# Patient Record
Sex: Female | Born: 1935 | Race: White | Hispanic: No | Smoking: Former smoker
Health system: Southern US, Community
[De-identification: ages and names within clinical notes are randomized; demographics above are authoritative.]

## PROBLEM LIST (undated history)

## (undated) DIAGNOSIS — G8929 Other chronic pain: Secondary | ICD-10-CM

## (undated) DIAGNOSIS — K224 Dyskinesia of esophagus: Secondary | ICD-10-CM

## (undated) DIAGNOSIS — Z8719 Personal history of other diseases of the digestive system: Secondary | ICD-10-CM

## (undated) DIAGNOSIS — K219 Gastro-esophageal reflux disease without esophagitis: Secondary | ICD-10-CM

## (undated) DIAGNOSIS — K635 Polyp of colon: Secondary | ICD-10-CM

## (undated) DIAGNOSIS — J42 Unspecified chronic bronchitis: Secondary | ICD-10-CM

## (undated) DIAGNOSIS — J45909 Unspecified asthma, uncomplicated: Secondary | ICD-10-CM

## (undated) DIAGNOSIS — M1990 Unspecified osteoarthritis, unspecified site: Secondary | ICD-10-CM

## (undated) DIAGNOSIS — I509 Heart failure, unspecified: Secondary | ICD-10-CM

## (undated) DIAGNOSIS — I2510 Atherosclerotic heart disease of native coronary artery without angina pectoris: Secondary | ICD-10-CM

## (undated) DIAGNOSIS — G009 Bacterial meningitis, unspecified: Secondary | ICD-10-CM

## (undated) DIAGNOSIS — E039 Hypothyroidism, unspecified: Secondary | ICD-10-CM

## (undated) DIAGNOSIS — I10 Essential (primary) hypertension: Secondary | ICD-10-CM

## (undated) DIAGNOSIS — Z9889 Other specified postprocedural states: Secondary | ICD-10-CM

## (undated) DIAGNOSIS — E785 Hyperlipidemia, unspecified: Secondary | ICD-10-CM

## (undated) DIAGNOSIS — H269 Unspecified cataract: Secondary | ICD-10-CM

## (undated) DIAGNOSIS — M797 Fibromyalgia: Secondary | ICD-10-CM

## (undated) DIAGNOSIS — I4891 Unspecified atrial fibrillation: Secondary | ICD-10-CM

## (undated) DIAGNOSIS — N200 Calculus of kidney: Secondary | ICD-10-CM

## (undated) DIAGNOSIS — K56609 Unspecified intestinal obstruction, unspecified as to partial versus complete obstruction: Secondary | ICD-10-CM

## (undated) DIAGNOSIS — F32A Depression, unspecified: Secondary | ICD-10-CM

## (undated) DIAGNOSIS — Z87442 Personal history of urinary calculi: Secondary | ICD-10-CM

## (undated) DIAGNOSIS — F329 Major depressive disorder, single episode, unspecified: Secondary | ICD-10-CM

## (undated) DIAGNOSIS — M549 Dorsalgia, unspecified: Secondary | ICD-10-CM

## (undated) DIAGNOSIS — F419 Anxiety disorder, unspecified: Secondary | ICD-10-CM

## (undated) HISTORY — PX: ROTATOR CUFF REPAIR: SHX139

## (undated) HISTORY — PX: JOINT REPLACEMENT: SHX530

## (undated) HISTORY — PX: BREAST SURGERY: SHX581

## (undated) HISTORY — PX: BRONCHOSCOPY: SUR163

## (undated) HISTORY — PX: ABDOMINAL HYSTERECTOMY: SHX81

## (undated) HISTORY — PX: BACK SURGERY: SHX140

## (undated) HISTORY — DX: Heart failure, unspecified: I50.9

## (undated) HISTORY — DX: Gastro-esophageal reflux disease without esophagitis: K21.9

## (undated) HISTORY — PX: TOTAL KNEE ARTHROPLASTY: SHX125

## (undated) HISTORY — DX: Hyperlipidemia, unspecified: E78.5

## (undated) HISTORY — DX: Unspecified cataract: H26.9

## (undated) HISTORY — DX: Anxiety disorder, unspecified: F41.9

## (undated) HISTORY — DX: Unspecified osteoarthritis, unspecified site: M19.90

## (undated) HISTORY — DX: Major depressive disorder, single episode, unspecified: F32.9

## (undated) HISTORY — DX: Unspecified atrial fibrillation: I48.91

## (undated) HISTORY — DX: Dyskinesia of esophagus: K22.4

## (undated) HISTORY — DX: Depression, unspecified: F32.A

## (undated) HISTORY — DX: Calculus of kidney: N20.0

## (undated) HISTORY — DX: Unspecified intestinal obstruction, unspecified as to partial versus complete obstruction: K56.609

## (undated) HISTORY — DX: Other specified postprocedural states: Z98.890

## (undated) HISTORY — DX: Unspecified asthma, uncomplicated: J45.909

## (undated) HISTORY — DX: Hypothyroidism, unspecified: E03.9

## (undated) HISTORY — PX: SINUS SURGERY WITH INSTATRAK: SHX5215

## (undated) HISTORY — DX: Personal history of other diseases of the digestive system: Z87.19

## (undated) HISTORY — DX: Polyp of colon: K63.5

## (undated) HISTORY — DX: Atherosclerotic heart disease of native coronary artery without angina pectoris: I25.10

## (undated) HISTORY — DX: Essential (primary) hypertension: I10

## (undated) HISTORY — DX: Fibromyalgia: M79.7

## (undated) HISTORY — PX: APPENDECTOMY: SHX54

---

## 2000-04-23 ENCOUNTER — Observation Stay (HOSPITAL_COMMUNITY): Admission: RE | Admit: 2000-04-23 | Discharge: 2000-04-24 | Payer: Self-pay | Admitting: Specialist

## 2000-06-10 ENCOUNTER — Encounter: Admission: RE | Admit: 2000-06-10 | Discharge: 2000-09-08 | Payer: Self-pay | Admitting: Specialist

## 2001-10-06 ENCOUNTER — Ambulatory Visit (HOSPITAL_COMMUNITY): Admission: RE | Admit: 2001-10-06 | Discharge: 2001-10-06 | Payer: Self-pay | Admitting: Internal Medicine

## 2001-10-06 ENCOUNTER — Encounter: Payer: Self-pay | Admitting: Internal Medicine

## 2001-10-06 DIAGNOSIS — K449 Diaphragmatic hernia without obstruction or gangrene: Secondary | ICD-10-CM | POA: Insufficient documentation

## 2001-12-16 HISTORY — PX: CORONARY ANGIOPLASTY WITH STENT PLACEMENT: SHX49

## 2002-03-24 ENCOUNTER — Encounter: Payer: Self-pay | Admitting: Cardiology

## 2002-03-24 ENCOUNTER — Inpatient Hospital Stay (HOSPITAL_COMMUNITY): Admission: RE | Admit: 2002-03-24 | Discharge: 2002-03-26 | Payer: Self-pay | Admitting: Cardiology

## 2002-04-01 ENCOUNTER — Encounter: Payer: Self-pay | Admitting: Emergency Medicine

## 2002-04-01 ENCOUNTER — Inpatient Hospital Stay (HOSPITAL_COMMUNITY): Admission: EM | Admit: 2002-04-01 | Discharge: 2002-04-06 | Payer: Self-pay | Admitting: Emergency Medicine

## 2002-04-23 ENCOUNTER — Encounter: Payer: Self-pay | Admitting: Family Medicine

## 2002-04-23 ENCOUNTER — Ambulatory Visit (HOSPITAL_COMMUNITY): Admission: RE | Admit: 2002-04-23 | Discharge: 2002-04-23 | Payer: Self-pay | Admitting: Family Medicine

## 2003-02-22 ENCOUNTER — Encounter: Payer: Self-pay | Admitting: Specialist

## 2003-02-22 ENCOUNTER — Ambulatory Visit (HOSPITAL_BASED_OUTPATIENT_CLINIC_OR_DEPARTMENT_OTHER): Admission: RE | Admit: 2003-02-22 | Discharge: 2003-02-23 | Payer: Self-pay | Admitting: Specialist

## 2003-02-22 ENCOUNTER — Encounter: Admission: RE | Admit: 2003-02-22 | Discharge: 2003-02-22 | Payer: Self-pay | Admitting: Specialist

## 2003-04-14 ENCOUNTER — Encounter: Admission: RE | Admit: 2003-04-14 | Discharge: 2003-07-13 | Payer: Self-pay | Admitting: Specialist

## 2003-06-19 ENCOUNTER — Emergency Department (HOSPITAL_COMMUNITY): Admission: EM | Admit: 2003-06-19 | Discharge: 2003-06-19 | Payer: Self-pay | Admitting: Emergency Medicine

## 2003-08-02 ENCOUNTER — Encounter: Admission: RE | Admit: 2003-08-02 | Discharge: 2003-09-08 | Payer: Self-pay | Admitting: Specialist

## 2004-08-29 ENCOUNTER — Encounter: Payer: Self-pay | Admitting: Internal Medicine

## 2004-09-28 ENCOUNTER — Observation Stay (HOSPITAL_COMMUNITY): Admission: AD | Admit: 2004-09-28 | Discharge: 2004-10-02 | Payer: Self-pay | Admitting: Cardiology

## 2004-10-17 ENCOUNTER — Ambulatory Visit: Payer: Self-pay | Admitting: Internal Medicine

## 2004-10-31 ENCOUNTER — Ambulatory Visit (HOSPITAL_COMMUNITY): Admission: RE | Admit: 2004-10-31 | Discharge: 2004-10-31 | Payer: Self-pay | Admitting: Internal Medicine

## 2004-10-31 DIAGNOSIS — K219 Gastro-esophageal reflux disease without esophagitis: Secondary | ICD-10-CM | POA: Insufficient documentation

## 2004-10-31 DIAGNOSIS — K222 Esophageal obstruction: Secondary | ICD-10-CM | POA: Insufficient documentation

## 2004-11-12 ENCOUNTER — Other Ambulatory Visit: Admission: RE | Admit: 2004-11-12 | Discharge: 2004-11-12 | Payer: Self-pay | Admitting: Family Medicine

## 2004-12-11 ENCOUNTER — Ambulatory Visit (HOSPITAL_COMMUNITY): Admission: RE | Admit: 2004-12-11 | Discharge: 2004-12-11 | Payer: Self-pay | Admitting: Obstetrics & Gynecology

## 2005-03-20 ENCOUNTER — Ambulatory Visit: Payer: Self-pay | Admitting: Internal Medicine

## 2005-03-28 ENCOUNTER — Ambulatory Visit: Payer: Self-pay | Admitting: Internal Medicine

## 2005-04-04 ENCOUNTER — Ambulatory Visit: Payer: Self-pay | Admitting: Cardiology

## 2005-04-16 ENCOUNTER — Ambulatory Visit: Payer: Self-pay | Admitting: Internal Medicine

## 2005-04-16 ENCOUNTER — Ambulatory Visit (HOSPITAL_COMMUNITY): Admission: RE | Admit: 2005-04-16 | Discharge: 2005-04-16 | Payer: Self-pay | Admitting: Internal Medicine

## 2005-05-20 ENCOUNTER — Ambulatory Visit: Payer: Self-pay | Admitting: Internal Medicine

## 2005-12-17 ENCOUNTER — Ambulatory Visit: Payer: Self-pay | Admitting: Cardiology

## 2006-09-08 ENCOUNTER — Ambulatory Visit: Payer: Self-pay | Admitting: Cardiology

## 2006-10-06 ENCOUNTER — Ambulatory Visit: Payer: Self-pay

## 2007-02-10 ENCOUNTER — Ambulatory Visit: Payer: Self-pay | Admitting: Cardiology

## 2007-03-27 ENCOUNTER — Inpatient Hospital Stay (HOSPITAL_COMMUNITY): Admission: RE | Admit: 2007-03-27 | Discharge: 2007-04-14 | Payer: Self-pay | Admitting: Specialist

## 2007-04-02 ENCOUNTER — Ambulatory Visit: Payer: Self-pay | Admitting: Physical Medicine & Rehabilitation

## 2007-04-06 ENCOUNTER — Ambulatory Visit: Payer: Self-pay | Admitting: Infectious Diseases

## 2007-04-14 ENCOUNTER — Inpatient Hospital Stay: Admission: AD | Admit: 2007-04-14 | Discharge: 2007-04-15 | Payer: Self-pay | Admitting: Internal Medicine

## 2007-04-16 ENCOUNTER — Ambulatory Visit: Payer: Self-pay | Admitting: Gastroenterology

## 2007-04-16 ENCOUNTER — Inpatient Hospital Stay (HOSPITAL_COMMUNITY): Admission: EM | Admit: 2007-04-16 | Discharge: 2007-04-20 | Payer: Self-pay | Admitting: Emergency Medicine

## 2007-04-17 ENCOUNTER — Encounter: Payer: Self-pay | Admitting: Internal Medicine

## 2007-04-17 ENCOUNTER — Ambulatory Visit: Payer: Self-pay | Admitting: Internal Medicine

## 2007-04-18 ENCOUNTER — Ambulatory Visit: Payer: Self-pay | Admitting: Internal Medicine

## 2007-04-20 ENCOUNTER — Inpatient Hospital Stay: Admission: AD | Admit: 2007-04-20 | Discharge: 2007-05-12 | Payer: Self-pay | Admitting: Internal Medicine

## 2007-04-22 ENCOUNTER — Ambulatory Visit: Payer: Self-pay | Admitting: Cardiology

## 2007-08-25 ENCOUNTER — Encounter: Admission: RE | Admit: 2007-08-25 | Discharge: 2007-11-23 | Payer: Self-pay | Admitting: Orthopedic Surgery

## 2007-09-23 ENCOUNTER — Ambulatory Visit (HOSPITAL_COMMUNITY): Admission: RE | Admit: 2007-09-23 | Discharge: 2007-09-23 | Payer: Self-pay | Admitting: Family Medicine

## 2007-11-18 ENCOUNTER — Encounter
Admission: RE | Admit: 2007-11-18 | Discharge: 2007-11-18 | Payer: Self-pay | Admitting: Physical Medicine and Rehabilitation

## 2008-02-24 ENCOUNTER — Encounter: Admission: RE | Admit: 2008-02-24 | Discharge: 2008-02-24 | Payer: Self-pay | Admitting: Anesthesiology

## 2008-03-02 ENCOUNTER — Ambulatory Visit: Payer: Self-pay | Admitting: Cardiology

## 2008-04-13 ENCOUNTER — Inpatient Hospital Stay (HOSPITAL_COMMUNITY): Admission: RE | Admit: 2008-04-13 | Discharge: 2008-04-17 | Payer: Self-pay | Admitting: Neurological Surgery

## 2008-06-06 ENCOUNTER — Encounter: Admission: RE | Admit: 2008-06-06 | Discharge: 2008-06-06 | Payer: Self-pay | Admitting: Neurological Surgery

## 2008-06-29 ENCOUNTER — Encounter: Admission: RE | Admit: 2008-06-29 | Discharge: 2008-06-29 | Payer: Self-pay | Admitting: Neurological Surgery

## 2008-07-29 ENCOUNTER — Encounter: Admission: RE | Admit: 2008-07-29 | Discharge: 2008-07-29 | Payer: Self-pay | Admitting: Neurological Surgery

## 2009-03-22 ENCOUNTER — Ambulatory Visit: Payer: Self-pay | Admitting: Cardiology

## 2009-04-05 ENCOUNTER — Telehealth (INDEPENDENT_AMBULATORY_CARE_PROVIDER_SITE_OTHER): Payer: Self-pay | Admitting: *Deleted

## 2009-04-06 ENCOUNTER — Encounter: Payer: Self-pay | Admitting: Cardiovascular Disease

## 2009-04-06 ENCOUNTER — Ambulatory Visit: Payer: Self-pay

## 2009-04-22 DIAGNOSIS — I480 Paroxysmal atrial fibrillation: Secondary | ICD-10-CM | POA: Insufficient documentation

## 2009-04-22 DIAGNOSIS — E785 Hyperlipidemia, unspecified: Secondary | ICD-10-CM | POA: Insufficient documentation

## 2009-04-22 DIAGNOSIS — I4891 Unspecified atrial fibrillation: Secondary | ICD-10-CM | POA: Insufficient documentation

## 2009-04-22 DIAGNOSIS — I2510 Atherosclerotic heart disease of native coronary artery without angina pectoris: Secondary | ICD-10-CM | POA: Insufficient documentation

## 2009-05-02 ENCOUNTER — Telehealth: Payer: Self-pay | Admitting: Internal Medicine

## 2009-05-03 ENCOUNTER — Ambulatory Visit: Payer: Self-pay | Admitting: Cardiology

## 2009-05-03 ENCOUNTER — Inpatient Hospital Stay (HOSPITAL_COMMUNITY): Admission: EM | Admit: 2009-05-03 | Discharge: 2009-05-05 | Payer: Self-pay | Admitting: Emergency Medicine

## 2009-05-03 ENCOUNTER — Ambulatory Visit: Payer: Self-pay | Admitting: Internal Medicine

## 2009-05-03 DIAGNOSIS — R079 Chest pain, unspecified: Secondary | ICD-10-CM | POA: Insufficient documentation

## 2009-05-04 ENCOUNTER — Ambulatory Visit: Payer: Self-pay | Admitting: Internal Medicine

## 2009-05-05 ENCOUNTER — Encounter: Payer: Self-pay | Admitting: Cardiology

## 2009-05-05 ENCOUNTER — Encounter: Payer: Self-pay | Admitting: Internal Medicine

## 2009-05-08 ENCOUNTER — Encounter: Payer: Self-pay | Admitting: Internal Medicine

## 2009-06-07 ENCOUNTER — Ambulatory Visit: Payer: Self-pay | Admitting: Cardiology

## 2009-09-04 ENCOUNTER — Encounter: Payer: Self-pay | Admitting: Internal Medicine

## 2009-09-25 ENCOUNTER — Encounter: Admission: RE | Admit: 2009-09-25 | Discharge: 2009-09-25 | Payer: Self-pay | Admitting: Neurological Surgery

## 2010-01-09 ENCOUNTER — Ambulatory Visit: Payer: Self-pay | Admitting: Pulmonary Disease

## 2010-01-18 ENCOUNTER — Telehealth (INDEPENDENT_AMBULATORY_CARE_PROVIDER_SITE_OTHER): Payer: Self-pay | Admitting: *Deleted

## 2010-01-24 ENCOUNTER — Ambulatory Visit: Payer: Self-pay | Admitting: Pulmonary Disease

## 2010-03-16 ENCOUNTER — Telehealth: Payer: Self-pay | Admitting: Internal Medicine

## 2010-03-20 ENCOUNTER — Ambulatory Visit: Payer: Self-pay | Admitting: Internal Medicine

## 2010-03-20 DIAGNOSIS — F329 Major depressive disorder, single episode, unspecified: Secondary | ICD-10-CM | POA: Insufficient documentation

## 2010-03-20 DIAGNOSIS — E039 Hypothyroidism, unspecified: Secondary | ICD-10-CM | POA: Insufficient documentation

## 2010-03-20 DIAGNOSIS — K224 Dyskinesia of esophagus: Secondary | ICD-10-CM | POA: Insufficient documentation

## 2010-03-20 DIAGNOSIS — R110 Nausea: Secondary | ICD-10-CM | POA: Insufficient documentation

## 2010-03-20 DIAGNOSIS — I10 Essential (primary) hypertension: Secondary | ICD-10-CM | POA: Insufficient documentation

## 2010-03-21 ENCOUNTER — Telehealth (INDEPENDENT_AMBULATORY_CARE_PROVIDER_SITE_OTHER): Payer: Self-pay | Admitting: *Deleted

## 2010-05-02 ENCOUNTER — Ambulatory Visit: Payer: Self-pay | Admitting: Cardiology

## 2010-07-30 ENCOUNTER — Ambulatory Visit: Payer: Self-pay | Admitting: Pulmonary Disease

## 2010-08-01 DIAGNOSIS — J45909 Unspecified asthma, uncomplicated: Secondary | ICD-10-CM

## 2010-08-01 DIAGNOSIS — J449 Chronic obstructive pulmonary disease, unspecified: Secondary | ICD-10-CM | POA: Insufficient documentation

## 2010-08-02 ENCOUNTER — Telehealth: Payer: Self-pay | Admitting: Pulmonary Disease

## 2010-08-02 LAB — CONVERTED CEMR LAB: IgE (Immunoglobulin E), Serum: 224.2 intl units/mL — ABNORMAL HIGH (ref 0.0–180.0)

## 2010-09-12 ENCOUNTER — Telehealth (INDEPENDENT_AMBULATORY_CARE_PROVIDER_SITE_OTHER): Payer: Self-pay | Admitting: *Deleted

## 2010-09-24 ENCOUNTER — Telehealth (INDEPENDENT_AMBULATORY_CARE_PROVIDER_SITE_OTHER): Payer: Self-pay | Admitting: *Deleted

## 2010-09-25 ENCOUNTER — Ambulatory Visit: Payer: Self-pay | Admitting: Pulmonary Disease

## 2010-09-26 ENCOUNTER — Telehealth: Payer: Self-pay | Admitting: Pulmonary Disease

## 2010-10-04 ENCOUNTER — Ambulatory Visit (HOSPITAL_BASED_OUTPATIENT_CLINIC_OR_DEPARTMENT_OTHER): Admission: RE | Admit: 2010-10-04 | Discharge: 2010-10-04 | Payer: Self-pay | Admitting: Pulmonary Disease

## 2010-10-04 ENCOUNTER — Ambulatory Visit: Payer: Self-pay | Admitting: Radiology

## 2010-10-05 ENCOUNTER — Telehealth: Payer: Self-pay | Admitting: Pulmonary Disease

## 2010-10-08 ENCOUNTER — Telehealth (INDEPENDENT_AMBULATORY_CARE_PROVIDER_SITE_OTHER): Payer: Self-pay | Admitting: *Deleted

## 2010-10-08 DIAGNOSIS — J019 Acute sinusitis, unspecified: Secondary | ICD-10-CM | POA: Insufficient documentation

## 2010-10-11 ENCOUNTER — Encounter: Payer: Self-pay | Admitting: Pulmonary Disease

## 2010-11-13 ENCOUNTER — Telehealth (INDEPENDENT_AMBULATORY_CARE_PROVIDER_SITE_OTHER): Payer: Self-pay | Admitting: *Deleted

## 2010-11-19 ENCOUNTER — Telehealth (INDEPENDENT_AMBULATORY_CARE_PROVIDER_SITE_OTHER): Payer: Self-pay | Admitting: *Deleted

## 2010-11-26 ENCOUNTER — Ambulatory Visit: Payer: Self-pay | Admitting: Adult Health

## 2010-12-04 ENCOUNTER — Encounter: Payer: Self-pay | Admitting: Internal Medicine

## 2010-12-19 ENCOUNTER — Ambulatory Visit (HOSPITAL_COMMUNITY)
Admission: RE | Admit: 2010-12-19 | Discharge: 2010-12-19 | Payer: Self-pay | Source: Home / Self Care | Attending: Otolaryngology | Admitting: Otolaryngology

## 2011-01-10 NOTE — Op Note (Addendum)
[name], SHOEMAKER                 ACCOUNT NO.:  [id number]  MEDICAL RECORD NO.:  [id number]          PATIENT TYPE:  AMB  LOCATION:  SDS                          FACILITY:  [facility]  PHYSICIAN:  Bright Spielmann H. Pollyann Kennedy, MD     DATE OF BIRTH:  26-Mar-1936  DATE OF PROCEDURE:  12/19/2010 DATE OF DISCHARGE:  12/19/2010                              OPERATIVE REPORT   PREOPERATIVE DIAGNOSES: 1. Chronic bilateral maxillary sinusitis. 2. Chronic bilateral ethmoid sinusitis. 3. Chronic left frontal sinusitis.  POSTOPERATIVE DIAGNOSES: 1. Chronic bilateral maxillary sinusitis. 2. Chronic bilateral ethmoid sinusitis. 3. Chronic left frontal sinusitis.  PROCEDURE: 1. Endoscopic left anterior ethmoidectomy, bilateral. 2. Endoscopic maxillary antrostomy, bilateral, with removal of tissue     from the right maxillary sinus. 3. Endoscopic left frontal sinusotomy.  SURGEON:  Temisha Murley H. Pollyann Kennedy, MD  ANESTHESIA:  General endotracheal anesthesia was used.  COMPLICATIONS:  No complications.  BLOOD LOSS:  Minimal.  FINDINGS:  Polypoid disease present throughout the anterior ethmoid complex bilaterally, worse on the right.  Complete obstruction of the maxillary sinuses bilaterally due to soft tissue edema within the infundibulum bilaterally.  The right maxillary sinus with thick mucoid secretions and very thick polypoid mucosa inferiorly and laterally. Left anterior ethmoid polypoid disease present at the roof of the ethmoid sinus just behind the frontal sinus duct.  There was polypoid disease present within the frontal duct as well on the left.  On CT imaging, there was what appeared to be an osteoma of the anteromedial aspect of the left frontal sinus.  This was not identified during the procedure and was felt to be submucosal and nonobstructing.  This was not treated.  A 75 year old lady with a history of chronic sinus symptoms for the past 2 years.  She also has chronic bronchitis which seems to be  related. She has minimal relief with antibiotics and has been on multiple courses of antibiotics over the past 2 years.  Repeated CT imaging has revealed chronic sinusitis.  Risks, benefits, alternatives, and complications to the procedure were explained to the patient who seemed to understand and agreed to surgery.  PROCEDURE:  The patient was taken to the operating room and placed on the operating table in supine position.  Following induction of general endotracheal anesthesia, the patient was draped in standard fashion. Afrin was used preoperatively in nasal cavities.  Xylocaine 1% with epinephrine was infiltrated into the superior and posterior attachments of the middle turbinates bilaterally and the lateral nasal wall just adjacent to the anterior aspect of the middle turbinates bilaterally. Total of less than 10 mL was injected. 1. Bilateral endoscopic anterior ethmoidectomy.  The left side was     dissected first.  The uncinate process was taken down at its base     using a sickle knife and then removed.  The microdebrider was then  used to open widely the infundibulum to remove the nasal     attachments of the uncinate process and to open up the bulla     ethmoidalis.  Dissection continued along the anterior cells     laterally to the lamina  papyracea which was kept intact and     superiorly to the fovea which was also kept intact.  In the     superior and anterior aspect, there was some polypoid disease that     was removed using upbiting forceps.  The ground lamella was kept     intact and the middle turbinate was kept stable.  Similar procedure     was performed on the right side.  On the right side, there was     significant osteitic changes throughout all the ethmoid partitions     with thickening and thickening of the bony septations.  On the     right side once anterior ethmoid dissection was accomplished, the     frontal duct was inspected and was wide open and there was  a clear     path all the way up to the frontal sinus. 2. Bilateral endoscopic maxillary antrostomy with removal of tissue     from the right side.  After the initial aspect of the anterior     ethmoidectomy dissection was completed, the 30-degree scope was     then used and a curved suction was used to palpate the fontanelle.     Maxillary sinus was entered on each side at the level of the     leading edge of the middle turbinate just above the inferior     turbinate and the sinus antrum was entered.  A backbiting forceps     was used to enlarge the antrostomy on the left side anteriorly and     a through-cut forceps was used posteriorly.  A wide antrostomy was     created.  This is also performed on the right side but only using     the backbiting forceps anteriorly and the microdebrider     posteriorly.  Once the sinus was opened widely, the abnormal     polypoid mucosa was identified and was stripped out using suction     and angled forceps.  There was also a large amount of thick     inspissated mucus that was present within the antrum on the right     side that was suctioned out. 3. Left endoscopic frontal sinusotomy.  Once the ethmoid dissection     was completed on the left, a 70-degree scope and curved suction     were used to inspect the frontal duct.  There was complete     obstruction of the frontal duct by a soft tissue band which was     entered into easily using the suction.  The angled microdebrider     was then used to enlarge this all the way back to the frontal duct     mucosa.  There was a nice wide open frontal sinus tract at this     point.  The osteoma that was seen on CT imaging was not directly     visualized, but the area where it was felt to be was not anywhere     near the frontal duct and was felt not to be of pathologic     significance.  The sinus cavities were suctioned of blood and     secretions.  Afrin pledgets were placed bilaterally, two on each      side of ethmoid cavities which were removed in the recovery room.     The pharynx was suctioned of blood and secretions.  The patient was     awakened, extubated,  and transferred to recovery in stable     condition.     Nikeya Maxim H. Pollyann Kennedy, MD     JHR/MEDQ  D:  12/19/2010  T:  12/20/2010  Job:  [id number]  cc:   Oretha Milch, MD  Electronically Signed by Serena Colonel MD on 01/10/2011 10:46:21 AM

## 2011-01-15 NOTE — Progress Notes (Signed)
Summary: sinus CT results   Phone Note Call from Patient   Caller: Patient Call For: alva Summary of Call: calling for results of sinus scan Initial call taken by: Rickard Patience,  October 08, 2010 11:41 AM  Follow-up for Phone Call        called spoke with patient who states she is still coughing up light green mucus with black spots in it,  and hoarseness.  per 10-05-10 note by RA, if the abx has not helped much will need referral to ENT.  pt is okay with this.  requests something under the [facility] health system as she has financial aid.  order placed in EMR. Boone Master CNA/MA  October 08, 2010 12:41 PM

## 2011-01-15 NOTE — Assessment & Plan Note (Signed)
Summary: cough with green sputum/hp office/mg   Visit Type:  Acute visit Copy to:  pcp Primary Provider/Referring Provider:  Rudi Heap, MD  CC:  Pt c/o productive cough with green mucus.  History of Present Illness: 74/F ex smoker  with reactive airway disease - ? GERD vs sinusitis, nml pFTs She smoked a PPD x 20-25 yrs before quitting in 1990. She developed a chest cold in dec & took 3 months & several rounds of Abx & steroids to  get better. SHe underwent esophageal dilation for hypertensive LES (Dr Juanda Chance)  & is on protonix two times a day . Evaluation for chest pain in 6/10 (Dr Antoine Poche) has shown non obstructive CAD - attributed to esophageal spasm. CXR 1/25 'bronchitic ' changes Took macrodantin x 2 yrs for UTIs , stopped '10          Prednisone makes her hyper and nervous.  Has osteoporosis with fractures.  July 30, 2010 1:59 PM  PFTs nml , much better , clearing of throat at night, c/o raspiness by evening  & 'bronchitis' in fall & winter. Sucralfate added by GI to zegerid - being seen by GI for esophageal spasms RAST  - IgE 225 -high but no sensitivity to common indoor/ outdoor allergens  September 25, 2010 3:42 PM  c/o coughing & wheezing on occasion, levaquin from 9/28-10/5 helped but symptoms recurred after stopping, cannot sing in choir, c/o nasal drainage, no heartburn or spasms x 1 mnth - feels similar to her episode in dec '10  Preventive Screening-Counseling & Management  Alcohol-Tobacco     Smoking Status: quit     Packs/Day: 1.0     Year Started: 1969     Year Quit: 1990  Current Medications (verified): 1)  Isosorbide Mononitrate Cr 60 Mg Xr24h-Tab (Isosorbide Mononitrate) .Marland Kitchen.. 1 By Mouth Daily 2)  Metoprolol Tartrate 50 Mg Tabs (Metoprolol Tartrate) .Marland Kitchen.. 1 By Mouth Two Times A Day 3)  Singulair 10 Mg  Tabs (Montelukast Sodium) .... Once Daily 4)  Xanax 0.5 Mg  Tabs (Alprazolam) .... At Bedtime 5)  Synthroid 50 Mcg  Tabs (Levothyroxine Sodium) .... Once  Daily 6)  Multivitamins   Tabs (Multiple Vitamin) .... Once Daily 7)  Zoloft 100 Mg  Tabs (Sertraline Hcl) .... Once Daily 8)  Lovaza 1 Gm  Caps (Omega-3-Acid Ethyl Esters) .Marland Kitchen.. 16 Grams Daily 9)  Crestor 20 Mg  Tabs (Rosuvastatin Calcium) .... Once Daily 10)  Nasonex 50 Mcg/act  Susp (Mometasone Furoate) .... One Spray Each Nostril Once Daily 11)  Symbicort 160-4.5 Mcg/act Aero (Budesonide-Formoterol Fumarate) .... Inhale 2 Puffs Two Times A Day 12)  Trilipix 135 Mg Cpdr (Choline Fenofibrate) .... Take 1 Tab At Bedtime 13)  Baby Aspirin 81 Mg Chew (Aspirin) .... Take 1 Tab in The Am 14)  Calcium 600/vitamin D 600-400 Mg-Unit Tabs (Calcium Carbonate-Vitamin D) .... Take 1 Tab Two Times A Day 15)  Hyomax-Sl 0.125 Mg Subl (Hyoscyamine Sulfate) .... Place 1 Tab Under The Tongue As Needed For Cramping and Abdominal Spasms 16)  Nitroglycerin 0.4 Mg Subl (Nitroglycerin) .... Place Under The Tongue For Angina As Needed 17)  Sucralfate 1 Gm Tabs (Sucralfate) .Marland Kitchen.. 1 Two Times A Day 18)  Omeprazole-Sodium Bicarbonate 40-1100 Mg Caps (Omeprazole-Sodium Bicarbonate) .... Take 1 Capsule  30 Min Before Breakfast 19)  Zofran 4 Mg Tabs (Ondansetron Hcl) .... Take 1 Tab Every 12 Hours As Needed For Nausea 20)  Vitamin D 1000 Unit Tabs (Cholecalciferol) .... Take 1 Tablet By Mouth Two Times  A Day 21)  Vita Zinc  Tabs (B Complex-C-E-Zn) .... Take 1 Tablet By Mouth Once A Day  Allergies (verified): 1)  ! Penicillin 2)  ! Sulfa 3)  ! * Naproxen 4)  ! Capoten 5)  ! Aspirin 6)  ! Pantoprazole Sodium  Past History:  Past Medical History: Last updated: 05/02/2010 CERD (ICD-530.81) HYPERTENSIVE LES/ESOPHAGUS Anxiety Disorder Arthritis Coronary Artery Disease Depression Hypertension Hypothyroidism Asthmatic bronchitis CAD (non obstructive CAD cath May 2010)  Social History: Last updated: 01/09/2010 Occupation: Retired,LIVES ALONE, Widowed Patient is a former smoker. 20years ago Alcohol Use -  no Daily Caffeine Use -1 Illicit Drug Use - no  Review of Systems       The patient complains of hoarseness.  The patient denies anorexia, fever, weight loss, weight gain, vision loss, decreased hearing, chest pain, syncope, dyspnea on exertion, peripheral edema, prolonged cough, headaches, hemoptysis, abdominal pain, melena, hematochezia, severe indigestion/heartburn, hematuria, muscle weakness, suspicious skin lesions, difficulty walking, depression, unusual weight change, abnormal bleeding, enlarged lymph nodes, and angioedema.    Vital Signs:  Patient profile:   75 year old female Height:      61 inches Weight:      146 pounds BMI:     27.69 O2 Sat:      97 % on Room air Temp:     98.4 degrees F oral Pulse rate:   59 / minute BP sitting:   126 / 86  (left arm) Cuff size:   regular  Vitals Entered By: Zackery Barefoot CMA (September 25, 2010 3:30 PM)  O2 Flow:  Room air CC: Pt c/o productive cough with green mucus Comments Medications reviewed with patient Verified contact number and pharmacy with patient Zackery Barefoot CMA  September 25, 2010 3:31 PM    Physical Exam  Additional Exam:  Gen. Pleasant, well-nourished, in no distress, normal affect ENT - no lesions, no post nasal drip Neck: No JVD, no thyromegaly, no carotid bruits Lungs: no use of accessory muscles, no dullness to percussion, clear without rales or rhonchi  Cardiovascular: Rhythm regular, heart sounds  normal, no murmurs or gallops, no peripheral edema Musculoskeletal: No deformities, no cyanosis or clubbing      Impression & Recommendations:  Problem # 1:  REACTIVE AIRWAY DISEASE (ICD-493.90)  - GERD vs sinusitis as trigger Nml PFTs in the past  Stay  on singulair& symbicort RAST  - IgE high but no sensitivity to common indoor/ outdoor allergens Treat for sinusitis with Levaquin x 14 ds, obtian sinus CT Try to avoid steroids here given osteoporosis & fractures  Problem # 2:  GERD  (ICD-530.81) Assessment: Comment Only  Her updated medication list for this problem includes:    Hyomax-sl 0.125 Mg Subl (Hyoscyamine sulfate) .Marland Kitchen... Place 1 tab under the tongue as needed for cramping and abdominal spasms    Sucralfate 1 Gm Tabs (Sucralfate) .Marland Kitchen... 1 two times a day    Omeprazole-sodium Bicarbonate 40-1100 Mg Caps (Omeprazole-sodium bicarbonate) .Marland Kitchen... Take 1 capsule  30 min before breakfast  Medications Added to Medication List This Visit: 1)  Calcium 600/vitamin D 600-400 Mg-unit Tabs (Calcium carbonate-vitamin d) .... Take 1 tab two times a day 2)  Vita Zinc Tabs (B complex-c-e-zn) .... Take 1 tablet by mouth once a day 3)  Levaquin 500 Mg Tabs (Levofloxacin) .... Once daily  Other Orders: Est. Patient Level III ([address]) Prescription Created Electronically [phone]) Radiology Referral (Radiology)  Patient Instructions: 1)  Please schedule a follow-up appointment in 6 weeks with TP  2)  Antibiotic x 14 ds 3)  Take mucinex until this clears 4)  A Limited Sinus CT has been recommended.  Your imaging study may require preauthorization.  5)  Copy sent to: dr Christell Constant Prescriptions: LEVAQUIN 500 MG TABS (LEVOFLOXACIN) once daily  #14 x 0   Entered and Authorized by:   Comer Locket. Vassie Loll MD   Signed by:   Comer Locket Vassie Loll MD on 09/25/2010   Method used:   Electronically to        [facility] [phone]* (retail)       [address] [place], [place]  [address]       Ph: [phone] or [phone]       Fax: [phone]   RxID:   [id number]

## 2011-01-15 NOTE — Discharge Summary (Signed)
Summary: Hospital DC   NAMEMADALINA, [name]                 ACCOUNT NO.:  [id number]      MEDICAL RECORD NO.:  [id number]          PATIENT TYPE:  INP      LOCATION:  4731                         FACILITY:  [facility]      PHYSICIAN:  Hillis Range, MD       DATE OF BIRTH:  Nov 15, 1936      DATE OF ADMISSION:  05/03/2009   DATE OF DISCHARGE:  05/05/2009                                  DISCHARGE SUMMARY      PRIMARY CARDIOLOGIST:  Rollene Rotunda, MD, [facility]      PRIMARY CARE PROVIDER:  Ernestina Penna, MD      GASTROENTEROLOGIST:  Wilhemina Bonito. Marina Goodell, MD      DISCHARGE DIAGNOSES:  Chest and epigastric pain.      SECONDARY DIAGNOSES:   1. Coronary artery disease status post previous stenting with       nonobstructive disease by cardiac catheterization on this       admission.   2. History of esophageal stricture with esophagogastroduodenoscopy on       this admission showing normal esophagogastroduodenoscopy, duodenum,       and stomach with a Savary dilator for suspected hypertensive lower       esophageal sphincter.   3. Hyperlipidemia.   4. Gastroesophageal reflux disease.   5. Asthmatic bronchitis.   6. Fibromuscular dysplasia.   7. Osteoarthritis/osteoporosis status post right total knee       arthroplasty in 2008 and status post left rotator cuff repair in       March 2004.   8. Hiatal hernia.   9. Hypothyroidism.   10.Status post left breast lumpectomy.   11.Anxiety.   12.Depression.   13.History of paroxysmal atrial fibrillation.      ALLERGIES:  PENICILLIN, SULFA, CAPOTEN, NOVOLIN, and NSAID.      PROCEDURES:  Left heart cardiac catheterization revealing nonobstructive   disease and normal left ventricular function.  EF is 60%.  EGD showing   normal duodenum, stomach, and esophagus with biopsies from the GE   junction to rule out Barrett esophagus.  There is passage of a 16-mm   Savary dilator for suspected hypertensive LES.  No evidence of   esophagitis.       HISTORY OF PRESENT ILLNESS:  A 75 year old Caucasian female with the   above problem list.  Over the years, she has had intermittent chest and   epigastric discomfort and has been evaluated by GI extensively.  She   recently saw Dr. Antoine Poche in April 2010 with these complaints and   underwent Myoview which was normal.  Approximately, 5 days prior to   admission, she began experiencing daily severe mid sternal epigastric   burning and pain without associated symptoms occurring predominantly in   the a.m. prior to breakfast and relieves with simultaneous nitroglycerin   and sublingual hyoscyamine.  She generally has a raw feeling in her   chest the remainder of the day and also notes dysphagia and mild   epigastric tenderness following the episode.  She  was seen in the GI   office on May 19 with these complaints and it was felt that her symptoms   may be cardiac in nature and a decision was made to have her admitted by   Cardiology, via [facility].      HOSPITAL COURSE:  The patient ruled out for MI.  Note, she was treated   with GI cocktail in the ED with relief of symptoms.  Because of her   coronary artery history, decision was made to proceed cardiac   catheterization which took place on May 20 showing nonobstructive   disease with normal LV function.  Gastroenterology was consulted and   they had high suspicion that esophageal spasm or esophagitis may be   causing her symptoms.  EGD was undertaken this morning showing normal   esophagus of stomach and duodenum.  There appear to be spasm at the LES   level and this was dilated.  There is no evidence of esophagitis.  GI   has recommended antireflux regimen including PPI and Carafate.  The   patient is being discharged to home today in good condition.      DISCHARGE LABORATORY FINDINGS:  Hemoglobin 12.7, hematocrit 36.5, WBC   8.1.  INR 1.0.  Sodium 128, potassium 3.7, chloride 96, CO2 25, BUN 14,    creatinine 0.84, glucose 108, total bilirubin 0.7, alkaline phosphatase   83, AST 23, ALT 18, total protein 7.6, albumin 3.8, calcium 9.4,   magnesium 2.1, amylase 54, lipase 35, CK 215, MB 1.6, troponin I 0.01,   total cholesterol 132, triglyceride 99, HDL 43, LDL 69, TSH 0.953.      DISPOSITION:  The patient is being discharged home today in good   condition.  Followup plans and appointments.  We have arranged for a   followup with Dr. Antoine Poche on June 23 at 10:45 a.m. in our [facility].  She will follow up with Dr. Christell Constant as previously scheduled.  She   is to follow up with Dr. Marina Goodell in 6-8 weeks.      DISCHARGE MEDICATIONS:   1. Singulair 10 mg daily.   2. Xanax 1 mg nightly.   3. Aspirin 81 mg daily.   4. Synthroid 50 mcg daily.   5. Protonix 40 mg b.i.d.   6. Multivitamin daily.   7. Zoloft 100 mg nightly.   8. Lovaza 2 g b.i.d.   9. Crestor 20 mg daily.   10.Nasonex p.r.n.   11.Symbicort 80/4.5 two puffs b.i.d.   12.Lopressor 50 mg b.i.d.   13.Trilipix 135 mg daily.   14.Zyprexa 5 mg daily.   15.Imdur 30 mg daily.   16.Nitrofurantoin 50 mg nightly.   17.Calcium 600 mg b.i.d.   18.Carafate 1 g b.i.d.      OUTSTANDING LABORATORY STUDIES:  None.      DURATION OF DISCHARGE/ENCOUNTER:  45 minutes including physician time.               [name], ANP               Hillis Range, MD   Electronically Signed         CB/MEDQ  D:  05/05/2009  T:  05/06/2009  Job:  [id number]      cc:   Ernestina Penna, M.D.   Wilhemina Bonito. Marina Goodell, MD  This report was created from the original endoscopy report, which was reviewed and signed by the above listed endoscopist.

## 2011-01-15 NOTE — Progress Notes (Signed)
Summary: HOARSE/ WHEEZING---rx for Levaquin  Phone Note Call from Patient   Caller: Patient Call For: [name] Summary of Call: PT IS HOARSE WITH COUGH AND YELLOW CONGESTION AND WHEEZING K [facility] [place] [phone] Initial call taken by: Rickard Patience,  September 12, 2010 9:56 AM  Follow-up for Phone Call        called and spoke with pt.  pt states symptoms started last week.  Pt c/o sore throat, head and chest congestion, coughing up white to yellow colored sputum, increased coughing esp at night, increased sob, tightness in chest, nasal congestion with thick cream colored nasal drainage, body aches, chills, and hoarseness.  Pt denied fever.  Pt has tried OTC Tylenol Cold and Sinus and Robitussion with no relief of symtoms.  Offered pt an appt today.  Pt declined stating she already has prior committments.  Will forward message to RA to address.  Aundra Millet Reynolds LPN  September 12, 2010 10:09 AM  Allergies (verified):  1)  ! Penicillin 2)  ! Sulfa 3)  ! * Naproxen 4)  ! Capoten 5)  ! Aspirin 6)  ! Pantoprazole Sodium  Additional Follow-up for Phone Call Additional follow up Details #1::        levaquin 500 x 7 ds , OV in 2 days if no better Additional Follow-up by: Comer Locket. Vassie Loll MD,  September 12, 2010 11:26 AM    Additional Follow-up for Phone Call Additional follow up Details #2::    called and spoke with pt.  pt aware of RA's recs and rx sent to pharmacy.  Instructed pt to call back and schedule ov in 2 days if she is no better or worse.  Pt verbalized understanding.  Aundra Millet Reynolds LPN  September 12, 2010 11:35 AM   New/Updated Medications: LEVAQUIN 500 MG TABS (LEVOFLOXACIN) Take 1 tablet by mouth once a day for 7 days Prescriptions: LEVAQUIN 500 MG TABS (LEVOFLOXACIN) Take 1 tablet by mouth once a day for 7 days  #7 x 0   Entered by:   Arman Filter LPN   Authorized by:   Comer Locket. Vassie Loll MD   Signed by:   Arman Filter LPN on 45/40/9811   Method used:   Electronically to    [facility] [phone]* (retail)       [address] [place], [place]  [address]       Ph: [phone] or [phone]       Fax: [phone]   RxID:   [id number]

## 2011-01-15 NOTE — Progress Notes (Signed)
   Phone Note Outgoing Call   Call placed by: Joselyn Glassman,  March 21, 2010 4:50 PM Call placed to: Pharmacy Summary of Call: [facility] denied the pt's 'Protonix.  She needs to fail Dexilant or Generic Zegerid,(Omeprazole/sodium bicarbonate).  I faxed RX to [facility] for the Generic Zegerid. She only has to pay $ 2.50.  If she fails this trial we will go back to [facility] and do another P/A for the Protonix. Initial call taken by: Joselyn Glassman,  March 21, 2010 4:52 PM

## 2011-01-15 NOTE — Progress Notes (Signed)
Summary: prescription for levaquin  Phone Note Call from Patient Call back at Home Phone [phone]   Caller: Patient Call For: alva Summary of Call: Pt states pharmacy still hasn't received her rx for levaquin 500mg  as of this morning. Initial call taken by: Darletta Moll,  September 26, 2010 9:57 AM  Follow-up for Phone Call        Pt staets she called pahrmacy this am and they told her they did not receive rx for levaquin. I called [facility] and gave verbal to pharmacists for med. Pt aware. Carron Curie CMA  September 26, 2010 10:31 AM

## 2011-01-15 NOTE — Progress Notes (Signed)
Summary: rx req / congestion-----appt with RA  Phone Note Call from Patient Call back at Home Phone [phone]   Caller: Patient Call For: [name] Summary of Call: pt still coughing up green phlegm, although not as much as last wk when she was prescribed an abx. thinks she may needs "another round". [facility] in [place].  Initial call taken by: Tivis Ringer, CNA,  September 24, 2010 11:18 AM  Follow-up for Phone Call        Pt c/o productive cough with green mucus, nasal discharge, wheezing, chest congestion, and nausea starting again yesterday AM. Last dose of abx was Wednesday. I offered pt appointment with TP and [name] tomorrow in [place], pt states she rather go where she is familiar and does not feel like driving today. I advised pt RA out of office today until 1:30pm. Please advise. Thanks. Zackery Barefoot CMA  September 24, 2010 11:45 AM   Additional Follow-up for Phone Call Additional follow up Details #1::        this is the tird time in 3 months that she is needing ABx. Needs OV with TP ASAP - no more ABx without seeing her. May need prednisone- short course Additional Follow-up by: Comer Locket. Vassie Loll MD,  September 24, 2010 11:52 AM    Additional Follow-up for Phone Call Additional follow up Details #2::    ATC  x 1 line busy. Zackery Barefoot CMA  September 24, 2010 12:14 PM   called and spoke with pt and informed her of RA's response and recs.  Pt agreed to come in and be seen.  Pt willing to see RA at the [place].  Gave pt directions to [place].  pt scheduled to see RA tomorrow at 3:15pm.  Arman Filter LPN  September 24, 2010 3:04 PM

## 2011-01-15 NOTE — Procedures (Signed)
Summary: E   EGD  Procedure date:  04/17/2007  Findings:      Findings: Esophagitis   NAME:  [name], [name]                 ACCOUNT NO.:  [id number]      MEDICAL RECORD NO.:  [id number]          PATIENT TYPE:  INP      LOCATION:  A316                          FACILITY:  [facility]      PHYSICIAN:  Lionel December, M.D.    DATE OF BIRTH:  1936-03-01      DATE OF PROCEDURE:  04/17/2007   DATE OF DISCHARGE:                                  OPERATIVE REPORT      PROCEDURE:  Esophagogastroduodenoscopy.      INDICATIONS FOR PROCEDURE:  [name] is a 75 year old Caucasian female   who was admitted to this facility from the [facility] for chest pain,   and she ruled out for myocardial infarct and PE.  Chest CT reveals   thickening to her distal esophagus.  There is also history of melena.   She also has dysphagia.  The procedure risks were reviewed with the   patient, and informed consent was obtained.      MEDICATIONS FOR CONSCIOUS SEDATION:  Benzocaine spray for pharyngeal   topical anesthesia, Demerol 25 mg IV, Versed 2 mg IV.      FINDINGS:  The procedure was performed in the endoscopy suite.  The   patient's vital signs and O2 saturations were monitored during procedure   and remained stable.  The patient was placed in the left lateral   recumbent position, and the Pentax videoscope was passed via oropharynx   without any difficulty into the esophagus.  A cheesy exudate was noted   involving the buccal mucosa as well as tongue and pharyngeal wall.      Esophagus.  There was a patchy cheesy exudate to esophageal mucosa,   predominately in the distal one-third.  She had patchy ulceration   involving the distal 7 cm.  One ulcer extended down to GE junction, but   there was no stricture.  The GE junction was at 35 cm from the incisors.      Stomach.  It was empty and distended very well with insufflation.  The   folds of the proximal stomach were normal.  Examination of the mucosa at    body, antrum, pyloric channel as well as angularis, fundus and cardia   was normal.      Duodenum.  The bulbar mucosa was normal.  The scope was passed into the   second part of the duodenum where mucosa and folds were normal.  The   endoscope was withdrawn.  The patient tolerated the procedure well.      FINAL DIAGNOSES:   1. [name] has a combination of ulcerative and Candida esophagitis.       In addition, she has oropharyngeal candidiasis.   2. No evidence of obvious stricture in her esophagus.  Therefore, her       esophagus was not dilated.   3. Brushing obtained for potassium hydroxide prep from her esophagus.  RECOMMENDATIONS:   1. Continue anti-reflux measures.   2. Continue PPI on b.i.d. schedule.   3. Mycostatin suspension 5000 units swish and swallow q.i.d. for 10       days.               Lionel December, M.D.   Electronically Signed            NR/MEDQ  D:  04/17/2007  T:  04/17/2007  Job:  [id number]      cc:   [facility]

## 2011-01-15 NOTE — Progress Notes (Signed)
Summary: triage   Phone Note Call from Patient Call back at Home Phone [phone]   Caller: Patient Call For: Marina Goodell Reason for Call: Talk to Nurse Summary of Call: Patient has severe nausea and a lot of spasm in her esophagus wantst speak to nurse. Initial call taken by: Tawni Levy,  March 16, 2010 10:12 AM  Follow-up for Phone Call        Was changed to generic protonix in Feb. and experinced severe nausea.Lastwk nausea re-ocuured and Phenergan didn't help so PCP gave her Zofran.Also had episode of esophageal spasm last wk. with chest burning for which she took Hyoscamine.Feels like the spasm  is gong to re-occur as she  is having dysphagia with meats and pills. Given appt. with PA for tomorrow as Dr.Mulan Adan is supervising DR.  Follow-up by: Teryl Lucy RN,  March 19, 2010 9:39 AM

## 2011-01-15 NOTE — Procedures (Signed)
Summary: Colonoscopy   Colonoscopy  Procedure date:  08/29/2004  Findings:      Results: Normal. Location:  [facility].   Patient Name: [name], [name] MRN: [id number] Procedure Procedures: Colonoscopy CPT: 84696.  Personnel: Endoscopist: Wilhemina Bonito. Marina Goodell, MD.  Referred By: Rudi Heap, MD.  Exam Location: Exam performed in Outpatient Clinic. Outpatient  Patient Consent: Procedure, Alternatives, Risks and Benefits discussed, consent obtained, from patient. Consent was obtained by the RN.  Indications  Average Risk Screening Routine.  History  Current Medications: Patient is not currently taking Coumadin.  Pre-Exam Physical: Performed Aug 29, 2004. Entire physical exam was normal.  Exam Exam: Extent of exam reached: Cecum, extent intended: Cecum.  The cecum was identified by appendiceal orifice and IC valve. Patient position: on left side. Colon retroflexion performed. Images taken. ASA Classification: II. Tolerance: excellent.  Monitoring: Pulse and BP monitoring, Oximetry used. Supplemental O2 given.  Colon Prep Used MIRALAX for colon prep. Prep results: excellent.  Sedation Meds: Patient assessed and found to be appropriate for moderate (conscious) sedation. Fentanyl 100 mcg. given IV. Versed 12 given IV.  Findings NORMAL EXAM: Cecum to Rectum. Comments: SMALL INTERNAL HEMORRHOIDS.   Assessment Normal examination.  Comments: NO POLYPS SEEN Events  Unplanned Interventions: No intervention was required.  Unplanned Events: There were no complications. Plans Disposition: After procedure patient sent to recovery. After recovery patient sent home.  Comments: RETURN TO THE CARE OF DR. MOORE

## 2011-01-15 NOTE — Progress Notes (Signed)
Summary: reaction  Phone Note Call from Patient   Caller: Patient Call For: [name] Summary of Call: pt having reaction to prednisone unable to take it. Initial call taken by: Rickard Patience,  January 18, 2010 9:22 AM  Follow-up for Phone Call        RA-Pt states that she has been unable to sleep since starting prednisone. She also feels nervous and shaky during the daytime. Pt wants to fisih course of prednisone, because it always helps to get rid of her bronchitis.  She takes xanax as needed, and has tried this but it is not helping her sleep. Pt is requesting something to help her sleep while she is finishing course of prednisone. RA out of office, I will forward to doc of the day. Please advise. Allergies: PCN, Sulfa, AsA, naproxen, capoten.Carron Curie CMA  January 18, 2010 9:57 AM   Additional Follow-up for Phone Call Additional follow up Details #1::        She has Xanax 0.5 mg. She can take 2 of those for sleep if needed while on prednisone. Additional Follow-up by: Waymon Budge MD,  January 18, 2010 12:46 PM    Additional Follow-up for Phone Call Additional follow up Details #2::    Pt states that she is already taking 2 tablets of Xanax 0.5mg  at bedtime but is still not able to sleep.  She also takes one Xanax in the AM but is still extremely nervous dtuing the day.  Pt feels that she may have become immune to the Xanax. Do you reccommend anything else? Abigail Miyamoto RN  January 18, 2010 1:20 PM   pt aware the symptoms should get better as the pred dose tapers down, pt was requesting Remus Loffler for sleep advised pt to call her primary md for this pt agreed that would prob. be best since her primary gave the xanax. also told pt to try taking the pred in the am instead of lunch time Follow-up by: Philipp Deputy CMA,  January 19, 2010 9:48 AM

## 2011-01-15 NOTE — Progress Notes (Signed)
   Phone Note Call from Patient   Summary of Call: Pt. requesting refill of Hyomax. Sent to pharmacy. Initial call taken by: Teryl Lucy RN,  November 13, 2010 9:35 AM    Prescriptions: HYOMAX-SL 0.125 MG SUBL (HYOSCYAMINE SULFATE) Place 1 tab under the tongue as needed for cramping and abdominal spasms  #60 x 0   Entered by:   Teryl Lucy RN   Authorized by:   Hilarie Fredrickson MD   Signed by:   Teryl Lucy RN on 11/13/2010   Method used:   Electronically to        [facility] [phone]* (retail)       [address] [place], [place]  [address]       Ph: [phone] or [phone]       Fax: [phone]   RxID:   [id number]

## 2011-01-15 NOTE — Assessment & Plan Note (Signed)
Summary: WHEEZING/ OKAY PER DR Jerri Hargadon/MBW   Visit Type:  Initial Consult Copy to:  pcp Primary Provider/Referring Provider:  Rudi Heap, MD  CC:  Pt here for pulmonary consult. Pt c/o wheezing x 3 months intermittent. Pt states she has been on 2 to 3 rounds of Prednisone and abx. Pt states just completed last dose of Avelox on Sunday after taking same x 7 days.  History of Present Illness: 73/F ex smoker  for evaluation of cough & wheezing. c/o wheezing x 3 months intermittent. Pt states she has been on 2 to 3 rounds of Prednisone and abx. Pt states just completed last dose of Avelox on Sunday after taking same x 7 days. She smoked a PPD x 20-25 yrs before quitting in 1990. She developed a chest cold in dec & ever got better. SHe underwent esophageal dilation for hypertensive LES (Dr Juanda Chance)  & is on protonix two times a day . Evaluation for chest pain in 6/10 (Dr Antoine Poche) has shown non obstructive CAD. She reports dry cough & dyspne aon exertion. CXR 1/25 'bronchitic ' changes            Preventive Screening-Counseling & Management  Alcohol-Tobacco     Smoking Status: quit     Packs/Day: 1.0     Year Started: 1969     Year Quit: 1990  Current Medications (verified): 1)  Isosorbide Mononitrate Cr 60 Mg Xr24h-Tab (Isosorbide Mononitrate) .Marland Kitchen.. 1 By Mouth Daily 2)  Toprol Xl 100 Mg  Xr24h-Tab (Metoprolol Succinate) .... Once Daily 3)  Singulair 10 Mg  Tabs (Montelukast Sodium) .... Once Daily 4)  Xanax 0.5 Mg  Tabs (Alprazolam) .... At Bedtime 5)  Synthroid 50 Mcg  Tabs (Levothyroxine Sodium) .... Once Daily 6)  Protonix 40 Mg  Tbec (Pantoprazole Sodium) .... Take 1 Tablet By Mouth Two Times A Day 7)  Fish Oil 1000 Mg  Caps (Omega-3 Fatty Acids) .... Once Daily 8)  Multivitamins   Tabs (Multiple Vitamin) .... Once Daily 9)  Zoloft 100 Mg  Tabs (Sertraline Hcl) .... Once Daily 10)  Lovaza 1 Gm  Caps (Omega-3-Acid Ethyl Esters) .Marland Kitchen.. 16 Grams Daily 11)  Crestor 20 Mg  Tabs  (Rosuvastatin Calcium) .... Once Daily 12)  Nasonex 50 Mcg/act  Susp (Mometasone Furoate) .... As Needed 13)  Symbicort 160-4.5 Mcg/act Aero (Budesonide-Formoterol Fumarate) .... Inhale 2 Puffs Two Times A Day 14)  Trilipix 135 Mg Cpdr (Choline Fenofibrate) .... Take 1 Tab At Bedtime 15)  Baby Aspirin 81 Mg Chew (Aspirin) .... Take 1 Tab in The Am 16)  Calcium 600/vitamin D 600-400 Mg-Unit Tabs (Calcium Carbonate-Vitamin D) .... Take 1 Tab Bid 17)  Hyomax-Sl 0.125 Mg Subl (Hyoscyamine Sulfate) .... Place 1 Tab Under The Tongue As Needed For Cramping and Abdominal Spasms 18)  Nitroglycerin 0.4 Mg Subl (Nitroglycerin) .... Place Under The Tongue For Angina As Needed  Allergies (verified): 1)  ! Penicillin 2)  ! Sulfa 3)  ! * Naproxen 4)  ! Capoten 5)  ! Aspirin  Past History:  Past Medical History: Last updated: 05/03/2009 Current Problems:   GERD (ICD-530.81) ESOPHAGEAL STRICTURE (ICD-530.3)2005 Anxiety Disorder Arthritis Coronary Artery Disease Depression Hypertension Hypothyroidism Asthmatic bronchitis  Past Surgical History: Last updated: 05/03/2009 S/P Coronary artery stent placement/MI 2003 Knee Replacement Back Surgery  Family History: Last updated: 01/09/2010 Family History of Prostate Cancer:Brother Family History of Heart Disease: Mother Family History Emphysema-sister Family History Asthma-mother  Social History: Last updated: 01/09/2010 Occupation: Retired,LIVES ALONE, Widowed Patient is a former  smoker. 20years ago Alcohol Use - no Daily Caffeine Use -1 Illicit Drug Use - no  Family History: Family History of Prostate Cancer:Brother Family History of Heart Disease: Mother Family History Emphysema-sister Family History Asthma-mother  Social History: Occupation: Retired,LIVES ALONE, Widowed Patient is a former smoker. 20years ago Alcohol Use - no Daily Caffeine Use -1 Illicit Drug Use - no Packs/Day:  1.0  Review of Systems       The  patient complains of shortness of breath with activity, non-productive cough, acid heartburn, loss of appetite, weight change, difficulty swallowing, nasal congestion/difficulty breathing through nose, anxiety, depression, and joint stiffness or pain.  The patient denies shortness of breath at rest, productive cough, coughing up blood, chest pain, irregular heartbeats, indigestion, abdominal pain, sore throat, tooth/dental problems, headaches, sneezing, itching, ear ache, hand/feet swelling, rash, change in color of mucus, and fever.    Vital Signs:  Patient profile:   75 year old female Height:      61 inches Weight:      138 pounds O2 Sat:      96 % on Room air Temp:     98.0 degrees F oral Pulse rate:   64 / minute BP sitting:   122 / 86  (left arm) Cuff size:   regular  Vitals Entered By: Zackery Barefoot CMA (January 09, 2010 1:37 PM)  O2 Flow:  Room air CC: Pt here for pulmonary consult. Pt c/o wheezing x 3 months intermittent. Pt states she has been on 2 to 3 rounds of Prednisone and abx. Pt states just completed last dose of Avelox on Sunday after taking same x 7 days Comments Medications reviewed with patient Verified pt's contact number Zackery Barefoot CMA  January 09, 2010 1:47 PM    Physical Exam  Additional Exam:  Gen. Pleasant, well-nourished, in no distress, normal affect ENT - no lesions, no post nasal drip Neck: No JVD, no thyromegaly, no carotid bruits Lungs: no use of accessory muscles, no dullness to percussion, clear without rales or rhonchi  Cardiovascular: Rhythm regular, heart sounds  normal, no murmurs or gallops, no peripheral edema Abdomen: soft and non-tender, no hepatosplenomegaly, BS normal. Musculoskeletal: No deformities, no cyanosis or clubbing Neuro:  alert, non focal     CXR  Procedure date:  01/09/2010  Findings:      IMPRESSION: Atherosclerosis of the aorta.   Bronchitis without consolidation or collapse.  Impression &  Recommendations:  Problem # 1:  BRONCHITIS, OBSTRUCTIVE CHRONIC W/EXACERB (ICD-491.21) Prednisone as prescribed Take symbicort 2 puffs two times a day  Take robitussin Dm OTC three times a day as needed Take breathing nebs two times a day , a 3rd time as needed  Orders: Prescription Created Electronically [phone]) Consultation Level III (60454) T-2 View CXR (71020TC) Prescription Created Electronically [phone])  Medications Added to Medication List This Visit: 1)  Protonix 40 Mg Tbec (Pantoprazole sodium) .... Take 1 tablet by mouth two times a day 2)  Symbicort 160-4.5 Mcg/act Aero (Budesonide-formoterol fumarate) .... Inhale 2 puffs two times a day 3)  Prednisone 10 Mg Tabs (Prednisone) .... Take 4 tabs  daily with food x 7 days, then 3 tabs daily x 7 days, then 2 tabs daily x 7 days, then 1 tab daily x7 days then stop.  Patient Instructions: 1)  Copy sent [id number] Moore 2)  Please schedule a follow-up appointment in 2 weeks. 3)  A chest x-ray has been recommended.  Your imaging study may require preauthorization.  4)  Prednisone as prescribed 5)  Take symbicort 2 puffs two times a day  6)  Take robitussin Dm OTC three times a day as needed 7)  Take breathing nebs two times a day , a 3rd time as needed  Prescriptions: PREDNISONE 10 MG TABS (PREDNISONE) Take 4 tabs  daily with food x 7 days, then 3 tabs daily x 7 days, then 2 tabs daily x 7 days, then 1 tab daily x7 days then stop.  #72 x 0   Entered and Authorized by:   Comer Locket Vassie Loll MD   Signed by:   Comer Locket Vassie Loll MD on 01/09/2010   Method used:   Electronically to        [facility] [phone]* (retail)       [address] [place], [place]  [address]       Ph: [phone] or [phone]       Fax: [phone]   RxID:   [id number]    Immunization History:  Influenza Immunization History:    Influenza:  historical (09/18/2009)

## 2011-01-15 NOTE — Assessment & Plan Note (Signed)
Summary: esophageal spasm/nausea    History of Present Illness Visit Type: Follow-up Visit Primary GI MD: Yancey Flemings MD Primary Provider: Rudi Heap, MD Chief Complaint: nausea, esophageal spasm History of Present Illness:   75  YO FEMALE KNOWN TO DR. PERRY . SHE HAS HX OF GERD,AND HYPERTENSIVE LES. SHE LAST HAD AN EGD IN 5/10 PER DR. Juanda Chance WHILE HOSPITALIZED FOR CHEST PAIN. SHE HAD SAVARY DILATION TO . Aaralyn SAYS SHE THINKS THE DILATION HELPED BECAUSE SHE DID WELL FOR SEVERAL MONTHS. SHE HAD ALSO BEEN ON PROTONIX REGULARLY. HER INSURANCE STOPPED PAYING FOR PROTONIX AND SHE WAS PUT ON GENERIC PANTOPRAZOLE WHICH SHE FEELS HAS MADE HER VERY NAUSEATED. SHE HAS REQUIRED ANTIEMETICS AND STILL FEELS NAUSEATED AFTER 3-4 DAYS OF CONSTANT NAUSEA SHE GETS HER "ATTACKS" OF SPASMS IN HER ESOPHAGUS WHICH IS UNCOMFORTABLE AND A CHOKING LIKE FEELING. SHE IS NOW ALOS HAVING HEARTBURN, INDIGESTION ,AND OCCASIONAL TROUBLE SWALLOWING PILLS,BUT NOT FOOD.  ANOTHER C/O IS A FEELING OF INCOMPLETE BOWEL EVACUATION,PRESSURE,BUT HAVING DAILY BM'S.    GI Review of Systems    Reports acid reflux, chest pain, heartburn, and  nausea.      Denies abdominal pain, belching, bloating, dysphagia with liquids, dysphagia with solids, loss of appetite, vomiting, vomiting blood, and  weight loss.      Reports change in bowel habits and  constipation.     Denies anal fissure, black tarry stools, diarrhea, diverticulosis, fecal incontinence, heme positive stool, hemorrhoids, irritable bowel syndrome, jaundice, light color stool, liver problems, rectal bleeding, and  rectal pain.    Current Medications (verified): 1)  Isosorbide Mononitrate Cr 60 Mg Xr24h-Tab (Isosorbide Mononitrate) .Marland Kitchen.. 1 By Mouth Daily 2)  Toprol Xl 100 Mg  Xr24h-Tab (Metoprolol Succinate) .... Once Daily 3)  Singulair 10 Mg  Tabs (Montelukast Sodium) .... Once Daily 4)  Xanax 0.5 Mg  Tabs (Alprazolam) .... At Bedtime 5)  Synthroid 50 Mcg  Tabs  (Levothyroxine Sodium) .... Once Daily 6)  Fish Oil 1000 Mg  Caps (Omega-3 Fatty Acids) .... Once Daily 7)  Multivitamins   Tabs (Multiple Vitamin) .... Once Daily 8)  Zoloft 100 Mg  Tabs (Sertraline Hcl) .... Once Daily 9)  Lovaza 1 Gm  Caps (Omega-3-Acid Ethyl Esters) .Marland Kitchen.. 16 Grams Daily 10)  Crestor 20 Mg  Tabs (Rosuvastatin Calcium) .... Once Daily 11)  Nasonex 50 Mcg/act  Susp (Mometasone Furoate) .... As Needed 12)  Symbicort 160-4.5 Mcg/act Aero (Budesonide-Formoterol Fumarate) .... Inhale 2 Puffs Two Times A Day 13)  Trilipix 135 Mg Cpdr (Choline Fenofibrate) .... Take 1 Tab At Bedtime 14)  Baby Aspirin 81 Mg Chew (Aspirin) .... Take 1 Tab in The Am 15)  Calcium 600/vitamin D 600-400 Mg-Unit Tabs (Calcium Carbonate-Vitamin D) .... Take 1 Tab Bid 16)  Hyomax-Sl 0.125 Mg Subl (Hyoscyamine Sulfate) .... Place 1 Tab Under The Tongue As Needed For Cramping and Abdominal Spasms 17)  Nitroglycerin 0.4 Mg Subl (Nitroglycerin) .... Place Under The Tongue For Angina As Needed 18)  Sucralfate 1 Gm Tabs (Sucralfate) .Marland Kitchen.. 1 Two Times A Day 19)  Promethazine Hcl 25 Mg Tabs (Promethazine Hcl) .... As Needed  Allergies: 1)  ! Penicillin 2)  ! Sulfa 3)  ! * Naproxen 4)  ! Capoten 5)  ! Aspirin 6)  ! Pantoprazole Sodium  Past History:  Past Medical History: Current Problems:   GERD (ICD-530.81) HYPERTENSIVE LES/ESOPHAGUS Anxiety Disorder Arthritis Coronary Artery Disease Depression Hypertension Hypothyroidism Asthmatic bronchitis  Past Surgical History: S/P Coronary artery stent placement/MI 2003 Knee Replacement Back  Surgery EGD WITH SAVARY DILATION LES 5/10 NORMAL COLONOSCOPY 2005  Family History: Reviewed history from 01/09/2010 and no changes required. Family History of Prostate Cancer:Brother Family History of Heart Disease: Mother Family History Emphysema-sister Family History Asthma-mother  Social History: Reviewed history from 01/09/2010 and no changes  required. Occupation: Retired,LIVES ALONE, Widowed Patient is a former smoker. 20years ago Alcohol Use - no Daily Caffeine Use -1 Illicit Drug Use - no  Review of Systems  The patient denies allergy/sinus, anemia, anxiety-new, arthritis/joint pain, back pain, blood in urine, breast changes/lumps, change in vision, confusion, cough, coughing up blood, depression-new, fainting, fatigue, fever, headaches-new, hearing problems, heart murmur, heart rhythm changes, itching, menstrual pain, muscle pains/cramps, night sweats, nosebleeds, pregnancy symptoms, shortness of breath, skin rash, sleeping problems, sore throat, swelling of feet/legs, swollen lymph glands, thirst - excessive , urination - excessive , urination changes/pain, urine leakage, vision changes, and voice change.         OTHERWISE AS IN HPI  Vital Signs:  Patient profile:   75 year old female Height:      61 inches Weight:      143 pounds BMI:     27.12 Pulse rate:   68 / minute Pulse rhythm:   regular BP sitting:   110 / 70  (left arm)  Vitals Entered By: Chales Abrahams CMA Duncan Dull) (March 20, 2010 9:06 AM)  Physical Exam  General:  Well developed, well nourished, no acute distress. Head:  Normocephalic and atraumatic. Eyes:  PERRLA, no icterus. Neck:  Supple; no masses or thyromegaly. Lungs:  Clear throughout to auscultation. Heart:  irregular rhythm:.   Abdomen:  SOFT, NONTENDER, NO MASS,OR HSM,BS+ Rectal:  NOT DONE Extremities:  No clubbing, cyanosis, edema or deformities noted. Neurologic:  Alert and  oriented x4;  grossly normal neurologically. Psych:  Alert and cooperative. Normal mood and affect.anxious.     Impression & Recommendations:  Problem # 1:  NAUSEA (ICD-787.02) Assessment New 75 YO WITH 4-6 WEEK HX OF NAUSEA,MAY BE MEDICATION INDUCED DUE TO CHANGE IN PPI THERAPY,ALSO CONSIDER REFLUX ESOPHAGITIS, GASTRITIS  STOP GENERIC PROTONIX  START TRIAL OFBRAND PROTONIX,OR IF NOT COVERED OMEPRAZOLE 40 MG TWICE  DAILY ZOFRAN 4 MG EVERY 6 HOURS AS NEEDED FOR NAUSEA. CONTINUE CARAFATE 1 GM TWICE DAILY DISCUSSED EGD,WILL WAIT AND SEE IF HER SXS IMPROVE ON NEW TWICE DAILY PPI REGIMEN,IF NOT THEN MWY BENEFIT FROM REPEAT DILATION OF HYPERTENSIVE LES.  Problem # 2:  ESOPHAGEAL MOTILITY DISORDER (ICD-530.5) Assessment: Comment Only PREVIOUSLY DOCUMENTED HYPERTENSIVE LES /MANOMETRY 2006  Problem # 3:  CORONARY ARTERY DISEASE (ICD-414.00) Assessment: Comment Only PREVIOUS MI/ STENT 2003  Problem # 4:  GERD (ICD-530.81) Assessment: Comment Only SEE ABOVE  Problem # 5:  ATRIAL FIBRILLATION, PAROXYSMAL (ICD-427.31) Assessment: Comment Only  Problem # 6:  564.0 CONSTIPATION Assessment: New ADD TRIAL OF MIRALAX 17 GM DAILY AS NEEDED NORMAL COLON 2005  Patient Instructions: 1)  We gave you some Miralax to use to purge the bowels for constipation. 2)  We sent a new perscription for Protonix and the pharmacy will fax me a letter to do a Prior Authorization with [employer] and I will do that and hopefully I can get this approved with them. I will let you know. 3)  We sent a perscription also for Zofran for nausea. 4)  Copy sent to : Rudi Heap, MD 5)  The medication list was reviewed and reconciled.  All changed / newly prescribed medications were explained.  A complete medication  list was provided to the patient / caregiver. Prescriptions: OMEPRAZOLE-SODIUM BICARBONATE 40-1100 MG CAPS (OMEPRAZOLE-SODIUM BICARBONATE) Take 1 capsule  30 min before breakfast  #30 x 3   Entered by:   Lowry Ram NCMA   Authorized by:   Sammuel Cooper PA-c   Signed by:   Lowry Ram NCMA on 03/21/2010   Method used:   Electronically to        [facility] [phone]* (retail)       [address] [place], [place]  [address]       Ph: [phone] or [phone]       Fax: [phone]   RxID:   [id number] PROTONIX 40 MG TBEC (PANTOPRAZOLE SODIUM) Take 1 tab 30 min prior to  breakfast and dinner Brand medically necessary #30 x 3   Entered by:   Lowry Ram NCMA   Authorized by:   Sammuel Cooper PA-c   Signed by:   Lowry Ram NCMA on 03/21/2010   Method used:   Electronically to        [facility] [phone]* (retail)       [address] [place], [place]  [address]       Ph: [phone] or [phone]       Fax: [phone]   RxID:   [id number] ZOFRAN 4 MG TABS (ONDANSETRON HCL) Take 1 tab every 12 hours as needed for nausea  #20 x 1   Entered by:   Lowry Ram NCMA   Authorized by:   Sammuel Cooper PA-c   Signed by:   Lowry Ram NCMA on 03/20/2010   Method used:   Electronically to        [facility] [phone]* (retail)       [address] [place], [place]  [address]       Ph: [phone] or [phone]       Fax: [phone]   RxID:   [id number] PROTONIX 40 MG TBEC (PANTOPRAZOLE SODIUM) Take 1 tab 30 min prior to breakfast and dinner  #60 x 3   Entered by:   Lowry Ram NCMA   Authorized by:   Sammuel Cooper PA-c   Signed by:   Lowry Ram NCMA on 03/20/2010   Method used:   Electronically to        [facility] [phone]* (retail)       [address] [place], [place]  [address]       Ph: [phone] or [phone]       Fax: [phone]   RxID:   [id number]

## 2011-01-15 NOTE — Progress Notes (Signed)
Summary: CT sinus > pt wants ENT referral  Phone Note Outgoing Call   Reason for Call: Discuss lab or test results Summary of Call: Let her know CT does show sinusitis - hope  longer course of ABx has helped. if not, will refer to ENT Initial call taken by: Comer Locket. Vassie Loll MD,  October 05, 2010 4:38 PM  Follow-up for Phone Call        pt called for CT results.  per pt still having prod cough with light green mucus that has black spots in it and hoarseness.  pt states that she has 2 of the levaquin left and would like the referral to ENT as long as it is under [facility].    Dr. Vassie Loll, what diagnosis would you like for this pt's referral to ENT?  thanks1 Follow-up by: Boone Master CNA/MA,  October 08, 2010 12:46 PM  Additional Follow-up for Phone Call Additional follow up Details #1::        done Additional Follow-up by: Comer Locket. Vassie Loll MD,  October 08, 2010 5:27 PM  New Problems: ACUTE SINUSITIS, UNSPECIFIED (ICD-461.9)   New Problems: ACUTE SINUSITIS, UNSPECIFIED (ICD-461.9)

## 2011-01-15 NOTE — Assessment & Plan Note (Signed)
Summary: Cardiology Nuclear Testing  Nuclear Med Background Indications for Stress Test: Evaluation for Ischemia, Stent Patency   History: Asthma, COPD, Heart Catheterization, Myocardial Infarction, Myocardial Perfusion Study, Stents  History Comments: '03 MI peri-procedural;'03 stent-ramus;'05 Cath:patent stent with N/O CAD;'07 MPS:no ischemia,? small prior distal anterior-septal infarct,EF=75%;h/o PAF  Symptoms: Chest Pain, Chest Pressure, Dizziness, Fatigue, Nausea, SOB    Nuclear Pre-Procedure Cardiac Risk Factors: Family History - CAD, History of Smoking, Hypertension, Lipids, Overweight, PVD Caffeine/Decaff Intake: none NPO After: 8:00 AM IV 0.9% NS with Angio Cath: 22g     IV Site: (R) AC IV Started by: Irean Hong RN Chest Size (in) 38     Cup Size C     Height (in): 61 Weight (lb): 141 BMI: 26.74  Nuclear Med Study 1 or 2 day study:  1 day     Stress Test Type:  Adenosine Reading MD:  Charlton Haws, MD     Referring MD:  Shela Commons. Hochrein Resting Radionuclide:  Technetium 79m Tetrofosmin     Resting Radionuclide Dose:  8 mCi  Stress Radionuclide:  Technetium 74m Tetrofosmin     Stress Radionuclide Dose:  30 mCi   Stress Protocol  Dose of Adenosine:  35.9 mg    Stress Test Technologist:  Frederick Peers EMT-P     Nuclear Technologist:  Domenic Polite CNMT  Rest Procedure  Myocardial perfusion imaging was performed at rest 45 minutes following the intraveneous administration of Myoview Technetium 64m Tetrofosmin.  Stress Procedure  The patient recieved IV adenosine at 140 mcg/kg/min for 4 minutes. There were no significant changes with infusion/2AV Blocked with infusion. Myoview was injected at the 2 minute mark and quantitative spect images were obtained after a 45 minute delay.  QPS Raw Data Images:  Normal; no motion artifact; normal heart/lung ratio. Stress Images:  NI: Uniform and normal uptake of tracer in all myocardial segments.  Rest Images:  Normal homogeneous uptake in all areas of the myocardium. Subtraction (SDS):  Normal Transient Ischemic Dilatation:  1.04  (Normal <1.22)  Lung/Heart Ratio:  .23  (Normal <0.45)  Quantitative Gated Spect Images QGS EDV:  59 ml QGS ESV:  16 ml QGS EF:  73 % QGS cine images:  Normal  Findings Normal nuclear study      Overall Impression  Exercise Capacity: Adenosine study with no exercise. BP Response: Normal blood pressure response. Clinical Symptoms: Atypical chest pain. ECG Impression: No significant ST segment change suggestive of ischemia. Overall Impression: Normal stress nuclear study. Overall Impression Comments: Normal  Appended Document: Cardiology Nuclear Testing No ischemia.  Follow up in one year.  Appended Document: Cardiology Nuclear Testing PT AWARE OF RESULTS.  PAM FLEMING-HAYES, RN

## 2011-01-15 NOTE — Progress Notes (Signed)
Summary: Alternative medication  Medications Added BENTYL 10 MG CAPS (DICYCLOMINE HCL) Take 1-or 2  p.o.every 6 hrs.as need for spasms       Phone Note From Pharmacy   Summary of Call: Note rec'd. from pharmacy  that her Hyoscamine S.L. that I renewed is no longer covered on her insurance plan and they are asking for alternate medication. Initial call taken by: Teryl Lucy RN,  November 19, 2010 3:35 PM  Follow-up for Phone Call        bentyl 10mg , #60; 1-2 by mouth q 6 hours prn Follow-up by: Hilarie Fredrickson MD,  November 19, 2010 6:32 PM  Additional Follow-up for Phone Call Additional follow up Details #1::        Rx. sent to pharmacy and pt. ntfd. Additional Follow-up by: Teryl Lucy RN,  November 20, 2010 8:26 AM    New/Updated Medications: BENTYL 10 MG CAPS (DICYCLOMINE HCL) Take 1-or 2  p.o.every 6 hrs.as need for spasms Prescriptions: BENTYL 10 MG CAPS (DICYCLOMINE HCL) Take 1-or 2  p.o.every 6 hrs.as need for spasms  #60 x 0   Entered by:   Teryl Lucy RN   Authorized by:   Hilarie Fredrickson MD   Signed by:   Teryl Lucy RN on 11/20/2010   Method used:   Electronically to        [facility] [phone]* (retail)       [address] [place], [place]  [address]       Ph: [phone] or [phone]       Fax: [phone]   RxID:   [id number]

## 2011-01-15 NOTE — Assessment & Plan Note (Signed)
Summary: [facility]  Medications Added METOPROLOL TARTRATE 50 MG TABS (METOPROLOL TARTRATE) 1 by mouth two times a day      Allergies Added:      Visit Type:  Follow-up Primary Provider:  Rudi Heap, MD  CC:  CAD.  History of Present Illness: The patient returns for one year followup. Since I last saw her she has done well. She has had her esophageal spasm treated and it is improved. This was felt to be GERD of chest pain last year. She has had a significant bronchitis and is being followed by pulmonary and apparently has pulmonary function tests pending. She actually think she is recovered from this and her breathing is doing fairly well currently. She is not describing any chest pressure, neck or arm discomfort. She is not describing any palpitations, presyncope or syncope. She is having no PND or orthopnea. She is unfortunately limited by right ankle pain and doesn't do as much exercising as I would help.  Current Medications (verified): 1)  Isosorbide Mononitrate Cr 60 Mg Xr24h-Tab (Isosorbide Mononitrate) .Marland Kitchen.. 1 By Mouth Daily 2)  Metoprolol Tartrate 50 Mg Tabs (Metoprolol Tartrate) .Marland Kitchen.. 1 By Mouth Two Times A Day 3)  Singulair 10 Mg  Tabs (Montelukast Sodium) .... Once Daily 4)  Xanax 0.5 Mg  Tabs (Alprazolam) .... At Bedtime 5)  Synthroid 50 Mcg  Tabs (Levothyroxine Sodium) .... Once Daily 6)  Fish Oil 1000 Mg  Caps (Omega-3 Fatty Acids) .... Once Daily 7)  Multivitamins   Tabs (Multiple Vitamin) .... Once Daily 8)  Zoloft 100 Mg  Tabs (Sertraline Hcl) .... Once Daily 9)  Lovaza 1 Gm  Caps (Omega-3-Acid Ethyl Esters) .Marland Kitchen.. 16 Grams Daily 10)  Crestor 20 Mg  Tabs (Rosuvastatin Calcium) .... Once Daily 11)  Nasonex 50 Mcg/act  Susp (Mometasone Furoate) .... As Needed 12)  Symbicort 160-4.5 Mcg/act Aero (Budesonide-Formoterol Fumarate) .... Inhale 2 Puffs Two Times A Day 13)  Trilipix 135 Mg Cpdr (Choline Fenofibrate) .... Take 1 Tab At Bedtime 14)  Baby Aspirin 81 Mg  Chew (Aspirin) .... Take 1 Tab in The Am 15)  Calcium 600/vitamin D 600-400 Mg-Unit Tabs (Calcium Carbonate-Vitamin D) .... Take 1 Tab Bid 16)  Hyomax-Sl 0.125 Mg Subl (Hyoscyamine Sulfate) .... Place 1 Tab Under The Tongue As Needed For Cramping and Abdominal Spasms 17)  Nitroglycerin 0.4 Mg Subl (Nitroglycerin) .... Place Under The Tongue For Angina As Needed 18)  Sucralfate 1 Gm Tabs (Sucralfate) .Marland Kitchen.. 1 Two Times A Day 19)  Omeprazole-Sodium Bicarbonate 40-1100 Mg Caps (Omeprazole-Sodium Bicarbonate) .... Take 1 Capsule  30 Min Before Breakfast 20)  Zofran 4 Mg Tabs (Ondansetron Hcl) .... Take 1 Tab Every 12 Hours As Needed For Nausea  Allergies (verified): 1)  ! Penicillin 2)  ! Sulfa 3)  ! * Naproxen 4)  ! Capoten 5)  ! Aspirin 6)  ! Pantoprazole Sodium  Past History:  Past Medical History: CERD (ICD-530.81) HYPERTENSIVE LES/ESOPHAGUS Anxiety Disorder Arthritis Coronary Artery Disease Depression Hypertension Hypothyroidism Asthmatic bronchitis CAD (non obstructive CAD cath May 2010)  Past Surgical History: Reviewed history from 03/20/2010 and no changes required. S/P Coronary artery stent placement/MI 2003 Knee Replacement Back Surgery EGD WITH SAVARY DILATION LES 5/10 NORMAL COLONOSCOPY 2005  Review of Systems       As stated in the HPI and negative for all other systems.   Vital Signs:  Patient profile:   75 year old female Height:      61 inches Weight:  144 pounds BMI:     27.31 Pulse rate:   66 / minute Resp:     16 per minute BP sitting:   102 / 62  (right arm)  Vitals Entered By: Marrion Coy, CNA (May 02, 2010 12:03 PM)  Physical Exam  General:  Well developed, well nourished, in no acute distress. Head:  normocephalic and atraumatic Eyes:  PERRLA/EOM intact; conjunctiva and lids normal. Mouth:  Teeth, gums and palate normal. Oral mucosa normal. Neck:  Neck supple, no JVD. No masses, thyromegaly or abnormal cervical nodes. Chest Wall:  no  deformities or breast masses noted Lungs:  Clear bilaterally to auscultation and percussion. Abdomen:  Bowel sounds positive; abdomen soft and non-tender without masses, organomegaly, or hernias noted. No hepatosplenomegaly. Msk:  Back normal, normal gait. Muscle strength and tone normal. Extremities:  No clubbing or cyanosis. Neurologic:  Alert and oriented x 3. Skin:  Intact without lesions or rashes. Cervical Nodes:  no significant adenopathy Inguinal Nodes:  no significant adenopathy Psych:  Normal affect.   Detailed Cardiovascular Exam  Neck    Carotids: Carotids full and equal bilaterally without bruits.      Neck Veins: Normal, no JVD.    Heart    Inspection: no deformities or lifts noted.      Palpation: normal PMI with no thrills palpable.      Auscultation: regular rate and rhythm, S1, S2 without murmurs, rubs, gallops, or clicks.    Vascular    Abdominal Aorta: no palpable masses, pulsations, or audible bruits.      Femoral Pulses: normal femoral pulses bilaterally.      Pedal Pulses: normal pedal pulses bilaterally.      Radial Pulses: normal radial pulses bilaterally.      Peripheral Circulation: no clubbing, cyanosis, or edema noted with normal capillary refill.     EKG  Procedure date:  05/02/2010  Findings:      sinus rhythm, rate 66, axis within normal limits, intervals within normal limits, no acute ST-T wave changes  Impression & Recommendations:  Problem # 1:  CORONARY ARTERY DISEASE (ICD-414.00) She has no new symptoms. She had catheterization in 2010. No further testing is indicated. She will continue with risk reduction. Orders: EKG w/ Interpretation (93000)  Problem # 2:  HYPERTENSION (ICD-401.9) Her blood pressure is well controlled. She will continue the meds as listed.  Problem # 3:  HYPERLIPIDEMIA (ICD-272.4) I reviewed her lipid profile. It was excellent in September of last year. It is due to be repeated and is followed by Dr.  Christell Constant.  Patient Instructions: 1)  Your physician recommends that you schedule a follow-up appointment in: 1 yr with Dr Antoine Poche In [place] 2)  Your physician recommends that you continue on your current medications as directed. Please refer to the Current Medication list given to you today. 3)  Your physician discussed the importance of regular exercise and recommended that you start or continue a regular exercise program for good health.    Recumbant bicycle

## 2011-01-15 NOTE — Letter (Signed)
Summary: Patient [facility] Biopsy Results  [facility]  [address] [place], [place] [address]   Phone: [phone]  Fax: [phone]        May 08, 2009 MRN: [id number]    [name] [address] [place], [place]  [address]    Dear Ms. Wee,  I am pleased to inform you that the biopsies taken during your recent endoscopic examination did not show any evidence of cancer upon pathologic examination.The tissue from Youe esophagus shows mild inflammation  Additional information/recommendations:  __No further action is needed at this time.  Please follow-up with      your primary care physician for your other healthcare needs.  __ Please call [phone] to schedule a return visit to review      your condition.  _x_ Continue with the treatment plan as outlined on the day of your      exam.  _.   Please call us if you are having persistent problems or have questions about your condition that have not been fully answered at this time.  Sincerely,  Hart Carwin  This letter has been electronically signed by your physician.  Appended Document: Patient Notice-Endo Biopsy Results Letter mailed to patient.

## 2011-01-15 NOTE — Progress Notes (Signed)
Summary: Nuc pre procedure   Phone Note Outgoing Call Call back at [facility] Phone [phone]   Call placed by: Rea College, CMA,  April 05, 2009 3:22 PM Call placed to: Patient Summary of Call: Left message with information on Myoview Information Sheet (see scanned document for details).        Nuclear Med Background Indications for Stress Test: Evaluation for Ischemia, Stent Patency   History: Asthma, COPD, Heart Catheterization, Myocardial Infarction, Myocardial Perfusion Study, Stents  History Comments: '03 MI peri-procedural;'03 stent-ramus;'05 Cath:patent stent with N/O CAD;'07 MPS:no ischemia,? small prior distal anterior-septal infarct,EF=75%;h/o PAF  Symptoms: Chest Pain, Chest Pressure, Dizziness, Nausea, SOB    Nuclear Pre-Procedure Cardiac Risk Factors: Family History - CAD, History of Smoking, Hypertension, Lipids, PVD

## 2011-01-15 NOTE — Miscellaneous (Signed)
Summary: Orders Update pft charges  Clinical Lists Changes  Orders: Added new Service order of Lung Volumes (94240) - Signed Added new Service order of Carbon Monoxide diffusing w/capacity (94720) - Signed Added new Service order of Spirometry (Pre & Post) (94060) - Signed 

## 2011-01-15 NOTE — Progress Notes (Signed)
Summary: cough  Phone Note Outgoing Call   Summary of Call: Pt c/o productive cough with pale yellow mucus, hoarseness, SOB worse at night x 2 days having to use Neb tx two times a day. Zackery Barefoot CMA  August 02, 2010 5:32 PM   Follow-up for Phone Call        resume symbicort -Azithro 500 x 5ds -call if no better for oral prednisone -OV if no better in 3 ds Follow-up by: Comer Locket. Vassie Loll MD,  August 02, 2010 6:10 PM  Additional Follow-up for Phone Call Additional follow up Details #1::        Called and informed pt of above recs. Medication sent to pharmacy. Zackery Barefoot CMA  August 03, 2010 8:58 AM     New/Updated Medications: AZITHROMYCIN 500 MG TABS (AZITHROMYCIN) Take 1 tablet by mouth once a day Prescriptions: AZITHROMYCIN 500 MG TABS (AZITHROMYCIN) Take 1 tablet by mouth once a day  #5 x 0   Entered by:   Zackery Barefoot CMA   Authorized by:   Comer Locket. Vassie Loll MD   Signed by:   Zackery Barefoot CMA on 08/03/2010   Method used:   Electronically to        [facility] [phone]* (retail)       [address] [place], [place]  [address]       Ph: [phone] or [phone]       Fax: [phone]   RxID:   [id number]

## 2011-01-15 NOTE — Assessment & Plan Note (Signed)
Summary: rov/apc   Visit Type:  Follow-up Copy to:  pcp Primary Provider/Referring Provider:  Rudi Heap, MD  CC:  Pt here for follow up with PFT. Pt c/o tickle in throat at night. Pt states breathing is better .  History of Present Illness: 73/F ex smoker  with reactive airway disease - ? GERD vs allergies, nml pFTs c/o wheezing x 3 months intermittent. Pt states she has been on 2 to 3 rounds of Prednisone and abx. Pt states just completed last dose of Avelox on Sunday after taking same x 7 days. She smoked a PPD x 20-25 yrs before quitting in 1990. She developed a chest cold in dec & never got better. SHe underwent esophageal dilation for hypertensive LES (Dr Juanda Chance)  & is on protonix two times a day . Evaluation for chest pain in 6/10 (Dr Antoine Poche) has shown non obstructive CAD. She reports dry cough & dyspne aon exertion. CXR 1/25 'bronchitic ' changes            January 24, 2010 2:46 PM  her wheezing has resolved.  She c/o SOB "on and off" and relates this to prednisone making her hyper and nervous. Not needing rescue inhale ror neb. Has osteoporosis with fractures.  July 30, 2010 1:59 PM  PFTs nml , much better , clearing of throat at night, c/o raspiness by evening  & 'bronchitis' in fall & winter. Sucralfate added by GI to zegerid - being seen by GI for esophageal spasms RAST  - IgE 225 -high but no sensitivity to common indoor/ outdoor allergens  Preventive Screening-Counseling & Management  Alcohol-Tobacco     Smoking Status: quit     Packs/Day: 1.0     Year Started: 1969     Year Quit: 1990  Current Medications (verified): 1)  Isosorbide Mononitrate Cr 60 Mg Xr24h-Tab (Isosorbide Mononitrate) .Marland Kitchen.. 1 By Mouth Daily 2)  Metoprolol Tartrate 50 Mg Tabs (Metoprolol Tartrate) .Marland Kitchen.. 1 By Mouth Two Times A Day 3)  Singulair 10 Mg  Tabs (Montelukast Sodium) .... Once Daily 4)  Xanax 0.5 Mg  Tabs (Alprazolam) .... At Bedtime 5)  Synthroid 50 Mcg  Tabs (Levothyroxine Sodium)  .... Once Daily 6)  Fish Oil 1000 Mg  Caps (Omega-3 Fatty Acids) .... Once Daily 7)  Multivitamins   Tabs (Multiple Vitamin) .... Once Daily 8)  Zoloft 100 Mg  Tabs (Sertraline Hcl) .... Once Daily 9)  Lovaza 1 Gm  Caps (Omega-3-Acid Ethyl Esters) .Marland Kitchen.. 16 Grams Daily 10)  Crestor 20 Mg  Tabs (Rosuvastatin Calcium) .... Once Daily 11)  Nasonex 50 Mcg/act  Susp (Mometasone Furoate) .... One Spray Each Nostril Once Daily 12)  Symbicort 160-4.5 Mcg/act Aero (Budesonide-Formoterol Fumarate) .... Inhale 2 Puffs Two Times A Day 13)  Trilipix 135 Mg Cpdr (Choline Fenofibrate) .... Take 1 Tab At Bedtime 14)  Baby Aspirin 81 Mg Chew (Aspirin) .... Take 1 Tab in The Am 15)  Calcium 600/vitamin D 600-400 Mg-Unit Tabs (Calcium Carbonate-Vitamin D) .... Take 1 Tab Bid 16)  Hyomax-Sl 0.125 Mg Subl (Hyoscyamine Sulfate) .... Place 1 Tab Under The Tongue As Needed For Cramping and Abdominal Spasms 17)  Nitroglycerin 0.4 Mg Subl (Nitroglycerin) .... Place Under The Tongue For Angina As Needed 18)  Sucralfate 1 Gm Tabs (Sucralfate) .Marland Kitchen.. 1 Two Times A Day 19)  Omeprazole-Sodium Bicarbonate 40-1100 Mg Caps (Omeprazole-Sodium Bicarbonate) .... Take 1 Capsule  30 Min Before Breakfast 20)  Zofran 4 Mg Tabs (Ondansetron Hcl) .... Take 1 Tab Every 12  Hours As Needed For Nausea 21)  Vitamin D 1000 Unit Tabs (Cholecalciferol) .... Take 1 Tablet By Mouth Two Times A Day  Allergies (verified): 1)  ! Penicillin 2)  ! Sulfa 3)  ! * Naproxen 4)  ! Capoten 5)  ! Aspirin 6)  ! Pantoprazole Sodium  Past History:  Past Medical History: Last updated: 05/02/2010 CERD (ICD-530.81) HYPERTENSIVE LES/ESOPHAGUS Anxiety Disorder Arthritis Coronary Artery Disease Depression Hypertension Hypothyroidism Asthmatic bronchitis CAD (non obstructive CAD cath May 2010)  Social History: Last updated: 01/09/2010 Occupation: Retired,LIVES ALONE, Widowed Patient is a former smoker. 20years ago Alcohol Use - no Daily Caffeine  Use -1 Illicit Drug Use - no  Review of Systems  The patient denies anorexia, fever, weight loss, weight gain, vision loss, decreased hearing, hoarseness, chest pain, syncope, dyspnea on exertion, peripheral edema, prolonged cough, headaches, hemoptysis, abdominal pain, melena, hematochezia, severe indigestion/heartburn, hematuria, muscle weakness, suspicious skin lesions, difficulty walking, depression, unusual weight change, and abnormal bleeding.    Vital Signs:  Patient profile:   75 year old female Height:      61 inches Weight:      150 pounds BMI:     28.44 O2 Sat:      98 % on Room air Temp:     98.2 degrees F oral Pulse rate:   75 / minute BP sitting:   130 / 78  (left arm) Cuff size:   regular  Vitals Entered By: Zackery Barefoot CMA (July 30, 2010 1:51 PM)  O2 Flow:  Room air CC: Pt here for follow up with PFT. Pt c/o tickle in throat at night. Pt states breathing is better  Comments Medications reviewed with patient Verified contact number and pharmacy with patient Zackery Barefoot CMA  July 30, 2010 1:51 PM    Physical Exam  Additional Exam:  Gen. Pleasant, well-nourished, in no distress, normal affect ENT - no lesions, no post nasal drip Neck: No JVD, no thyromegaly, no carotid bruits Lungs: no use of accessory muscles, no dullness to percussion, clear without rales or rhonchi  Cardiovascular: Rhythm regular, heart sounds  normal, no murmurs or gallops, no peripheral edema Musculoskeletal: No deformities, no cyanosis or clubbing      Impression & Recommendations:  Problem # 1:  REACTIVE AIRWAY DISEASE (ICD-493.90) - GERD vs allergies as trigger Nml PFTs  Trial off symbicort - call us if symptoms start again Stay  on singulair RAST  - IgE high but no sensitivity to common indoor/ outdoor allergens  Medications Added to Medication List This Visit: 1)  Nasonex 50 Mcg/act Susp (Mometasone furoate) .... One spray each nostril once daily 2)  Vitamin D  1000 Unit Tabs (Cholecalciferol) .... Take 1 tablet by mouth two times a day  Other Orders: Est. Patient Level III (69485) T-"RAST" (Allergy Full Profile) IGE ([id number])  Patient Instructions: 1)  Copy sent to: Dr Christell Constant 2)  Please schedule a follow-up appointment in 4 months with TP. 3)  Trial off symbicort - call us if symptoms start again 4)  Stay  on singulair

## 2011-01-15 NOTE — Progress Notes (Signed)
Summary: chest pain   Phone Note Call from Patient Call back at Home Phone [phone]   Caller: Patient Call For: Kesleigh Morson Reason for Call: Talk to Nurse Summary of Call: Patient wants to be seen asap due to non cardiac chest pain since Friday Initial call taken by: Tawni Levy,  May 02, 2009 3:33 PM  Follow-up for Phone Call        Patient has been having chest discomfot since Friday and feels it is getting worse.  has cardiac issues also.  does have Esophageal spasms to.  Has been taking her cardiac meds also her PPI an hycosamine.  Appointment givent to patient tomorrow with Willette Cluster, RNP.  If symptoms worsen patient instructed to go to ed . Feels chest pain is spreading thurout all of chest area.  iNSTRUCTED AGAIN IF SYMPTOMS WORSEN GOT TO ED. Follow-up by: Paulene Floor, RN,  May 02, 2009 4:03 PM  Additional Follow-up for Phone Call Additional follow up Details #1::        THIS PATIENT SHOULD BE SEEN HERE FOR CHEST PAIN ONLY AFTER BEING CLEARED BY HER CARDIOLOGIST Additional Follow-up by: Hilarie Fredrickson MD,  May 02, 2009 5:35 PM    Additional Follow-up for Phone Call Additional follow up Details #2::    Pt. was seen by Mike Gip [facility] today and admitted to the hospital, under Dr.Hochrein.  Follow-up by: Laureen Ochs LPN,  May 03, 2009 4:12 PM

## 2011-01-15 NOTE — Assessment & Plan Note (Signed)
Summary: rov 2 wks ///kp   Copy to:  pcp Primary Provider/Referring Provider:  Rudi Heap, MD  CC:  2 wk followup.  Pt states that her wheezing has resolved.  She c/o SOB "on and off" and relates this to prednisone making her hyper and nervous.  Marland Kitchen  History of Present Illness: 73/F ex smoker  for evaluation of cough & wheezing. c/o wheezing x 3 months intermittent. Pt states she has been on 2 to 3 rounds of Prednisone and abx. Pt states just completed last dose of Avelox on Sunday after taking same x 7 days. She smoked a PPD x 20-25 yrs before quitting in 1990. She developed a chest cold in dec & ever got better. SHe underwent esophageal dilation for hypertensive LES (Dr Juanda Chance)  & is on protonix two times a day . Evaluation for chest pain in 6/10 (Dr Antoine Poche) has shown non obstructive CAD. She reports dry cough & dyspne aon exertion. CXR 1/25 'bronchitic ' changes            January 24, 2010 2:46 PM  her wheezing has resolved.  She c/o SOB "on and off" and relates this to prednisone making her hyper and nervous. Not needing rescue inhale ror neb. Has osteoporosis with fractures.  Current Medications (verified): 1)  Isosorbide Mononitrate Cr 60 Mg Xr24h-Tab (Isosorbide Mononitrate) .Marland Kitchen.. 1 By Mouth Daily 2)  Toprol Xl 100 Mg  Xr24h-Tab (Metoprolol Succinate) .... Once Daily 3)  Singulair 10 Mg  Tabs (Montelukast Sodium) .... Once Daily 4)  Xanax 0.5 Mg  Tabs (Alprazolam) .... At Bedtime 5)  Synthroid 50 Mcg  Tabs (Levothyroxine Sodium) .... Once Daily 6)  Protonix 40 Mg  Tbec (Pantoprazole Sodium) .... Take 1 Tablet By Mouth Two Times A Day 7)  Fish Oil 1000 Mg  Caps (Omega-3 Fatty Acids) .... Once Daily 8)  Multivitamins   Tabs (Multiple Vitamin) .... Once Daily 9)  Zoloft 100 Mg  Tabs (Sertraline Hcl) .... Once Daily 10)  Lovaza 1 Gm  Caps (Omega-3-Acid Ethyl Esters) .Marland Kitchen.. 16 Grams Daily 11)  Crestor 20 Mg  Tabs (Rosuvastatin Calcium) .... Once Daily 12)  Nasonex 50 Mcg/act  Susp  (Mometasone Furoate) .... As Needed 13)  Symbicort 160-4.5 Mcg/act Aero (Budesonide-Formoterol Fumarate) .... Inhale 2 Puffs Two Times A Day 14)  Trilipix 135 Mg Cpdr (Choline Fenofibrate) .... Take 1 Tab At Bedtime 15)  Baby Aspirin 81 Mg Chew (Aspirin) .... Take 1 Tab in The Am 16)  Calcium 600/vitamin D 600-400 Mg-Unit Tabs (Calcium Carbonate-Vitamin D) .... Take 1 Tab Bid 17)  Hyomax-Sl 0.125 Mg Subl (Hyoscyamine Sulfate) .... Place 1 Tab Under The Tongue As Needed For Cramping and Abdominal Spasms 18)  Nitroglycerin 0.4 Mg Subl (Nitroglycerin) .... Place Under The Tongue For Angina As Needed 19)  Prednisone 10 Mg Tabs (Prednisone) .... Take 4 Tabs  Daily With Food X 7 Days, Then 3 Tabs Daily X 7 Days, Then 2 Tabs Daily X 7 Days, Then 1 Tab Daily X7 Days Then Stop. 20)  Sucralfate 1 Gm Tabs (Sucralfate) .Marland Kitchen.. 1 Two Times A Day  Allergies (verified): 1)  ! Penicillin 2)  ! Sulfa 3)  ! * Naproxen 4)  ! Capoten 5)  ! Aspirin  Past History:  Past Medical History: Last updated: 05/03/2009 Current Problems:   GERD (ICD-530.81) ESOPHAGEAL STRICTURE (ICD-530.3)2005 Anxiety Disorder Arthritis Coronary Artery Disease Depression Hypertension Hypothyroidism Asthmatic bronchitis  Social History: Last updated: 01/09/2010 Occupation: Retired,LIVES ALONE, Widowed Patient is a former  smoker. 20years ago Alcohol Use - no Daily Caffeine Use -1 Illicit Drug Use - no  Review of Systems  The patient denies anorexia, fever, weight loss, weight gain, vision loss, decreased hearing, hoarseness, chest pain, syncope, dyspnea on exertion, peripheral edema, prolonged cough, headaches, hemoptysis, abdominal pain, melena, hematochezia, severe indigestion/heartburn, hematuria, muscle weakness, suspicious skin lesions, difficulty walking, depression, unusual weight change, and abnormal bleeding.    Vital Signs:  Patient profile:   75 year old female Weight:      145 pounds O2 Sat:      97 % on  Room air Temp:     97.8 degrees F oral Pulse rate:   79 / minute BP sitting:   120 / 70  (left arm)  Vitals Entered By: Vernie Murders (January 24, 2010 2:24 PM)  O2 Flow:  Room air  Physical Exam  Additional Exam:  Gen. Pleasant, well-nourished, in no distress, normal affect ENT - no lesions, no post nasal drip Neck: No JVD, no thyromegaly, no carotid bruits Lungs: no use of accessory muscles, no dullness to percussion, clear without rales or rhonchi  Cardiovascular: Rhythm regular, heart sounds  normal, no murmurs or gallops, no peripheral edema Musculoskeletal: No deformities, no cyanosis or clubbing      Impression & Recommendations:  Problem # 1:  BRONCHITIS, OBSTRUCTIVE CHRONIC W/EXACERB (ICD-491.21) Decrease prednisone 20 mg x 5 days, then 10 mg x 5 ds then STOP. Breathing test in 2 months Stay on symbicort  Orders: Est. Patient Level III (16109) Pulmonary Referral (Pulmonary)  Medications Added to Medication List This Visit: 1)  Sucralfate 1 Gm Tabs (Sucralfate) .Marland Kitchen.. 1 two times a day  Patient Instructions: 1)  Copy sent to: Dr Christell Constant 2)  Please schedule a follow-up appointment in 3 months. 3)  Decrease prednisone 20 mg x 5 days, then 10 mg x 5 ds then STOP. 4)  Breathing test in 2 months 5)  Stay on symbicort

## 2011-01-18 NOTE — Consult Note (Signed)
Summary: [facility]  [facility]   Imported By: Lester Montezuma 10/18/2010 09:29:33  _____________________________________________________________________  External Attachment:    Type:   Image     Comment:   External Document

## 2011-02-25 LAB — CBC
HCT: 37.6 % (ref 36.0–46.0)
Hemoglobin: 13.4 g/dL (ref 12.0–15.0)
MCH: 33.9 pg (ref 26.0–34.0)
MCHC: 35.6 g/dL (ref 30.0–36.0)
MCV: 95.2 fL (ref 78.0–100.0)
Platelets: 327 10*3/uL (ref 150–400)
RBC: 3.95 MIL/uL (ref 3.87–5.11)
RDW: 12.8 % (ref 11.5–15.5)
WBC: 7.9 10*3/uL (ref 4.0–10.5)

## 2011-02-25 LAB — BASIC METABOLIC PANEL
BUN: 17 mg/dL (ref 6–23)
CO2: 30 mEq/L (ref 19–32)
Calcium: 9.9 mg/dL (ref 8.4–10.5)
Chloride: 99 mEq/L (ref 96–112)
Creatinine, Ser: 0.97 mg/dL (ref 0.4–1.2)
GFR calc Af Amer: >60 mL/min (ref >60)
GFR calc non Af Amer: 56 mL/min — ABNORMAL LOW (ref >60)
Glucose, Bld: 93 mg/dL (ref 70–99)
Potassium: 4.8 mEq/L (ref 3.5–5.1)
Sodium: 135 mEq/L (ref 135–145)

## 2011-02-25 LAB — SURGICAL PCR SCREEN
MRSA, PCR: NEGATIVE
Staphylococcus aureus: NEGATIVE

## 2011-03-25 ENCOUNTER — Encounter: Payer: Self-pay | Admitting: Pulmonary Disease

## 2011-03-25 ENCOUNTER — Telehealth: Payer: Self-pay | Admitting: Pulmonary Disease

## 2011-03-25 NOTE — Telephone Encounter (Signed)
Pt states she is having more trouble with her asthma and her nebulizer doesn't seem to help much. Pt is requesting to be seen in the [place] office on Tues., 03/26/2011 in the afternoon. Pt sch to see RB @ 1:45pm but will go to the closest ER if her breathing gets worse before being seen.

## 2011-03-26 ENCOUNTER — Telehealth: Payer: Self-pay | Admitting: Emergency Medicine

## 2011-03-26 ENCOUNTER — Ambulatory Visit (INDEPENDENT_AMBULATORY_CARE_PROVIDER_SITE_OTHER): Payer: Medicare Other | Admitting: Emergency Medicine

## 2011-03-26 ENCOUNTER — Encounter: Payer: Self-pay | Admitting: Emergency Medicine

## 2011-03-26 DIAGNOSIS — J019 Acute sinusitis, unspecified: Secondary | ICD-10-CM

## 2011-03-26 DIAGNOSIS — R059 Cough, unspecified: Secondary | ICD-10-CM | POA: Insufficient documentation

## 2011-03-26 DIAGNOSIS — R05 Cough: Secondary | ICD-10-CM

## 2011-03-26 LAB — CBC
HCT: 36.5 % (ref 36.0–46.0)
Hemoglobin: 12.7 g/dL (ref 12.0–15.0)
MCHC: 34.8 g/dL (ref 30.0–36.0)
MCV: 95.3 fL (ref 78.0–100.0)
Platelets: ADEQUATE 10*3/uL (ref 150–400)
RBC: 3.83 MIL/uL — ABNORMAL LOW (ref 3.87–5.11)
RDW: 12.9 % (ref 11.5–15.5)
WBC: 8.1 10*3/uL (ref 4.0–10.5)

## 2011-03-26 LAB — URINE MICROSCOPIC-ADD ON

## 2011-03-26 LAB — DIFFERENTIAL
Basophils Absolute: 0.1 10*3/uL (ref 0.0–0.1)
Basophils Relative: 1 % (ref 0–1)
Eosinophils Absolute: 0.2 10*3/uL (ref 0.0–0.7)
Eosinophils Relative: 3 % (ref 0–5)
Lymphocytes Relative: 30 % (ref 12–46)
Lymphs Abs: 2.4 10*3/uL (ref 0.7–4.0)
Monocytes Absolute: 0.4 10*3/uL (ref 0.1–1.0)
Monocytes Relative: 5 % (ref 3–12)
Neutro Abs: 5 10*3/uL (ref 1.7–7.7)
Neutrophils Relative %: 61 % (ref 43–77)

## 2011-03-26 LAB — CARDIAC PANEL(CRET KIN+CKTOT+MB+TROPI)
CK, MB: 1.6 ng/mL (ref 0.3–4.0)
CK, MB: 1.6 ng/mL (ref 0.3–4.0)
CK, MB: 1.7 ng/mL (ref 0.3–4.0)
Relative Index: 0.7 (ref 0.0–2.5)
Relative Index: 0.7 (ref 0.0–2.5)
Relative Index: 0.7 (ref 0.0–2.5)
Total CK: 215 U/L — ABNORMAL HIGH (ref 7–177)
Total CK: 226 U/L — ABNORMAL HIGH (ref 7–177)
Total CK: 247 U/L — ABNORMAL HIGH (ref 7–177)
Troponin I: 0.01 ng/mL (ref 0.00–0.06)
Troponin I: 0.03 ng/mL (ref 0.00–0.06)

## 2011-03-26 LAB — POCT CARDIAC MARKERS
CKMB, poc: 1.1 ng/mL (ref 1.0–8.0)
Myoglobin, poc: 122 ng/mL (ref 12–200)
Troponin i, poc: <0.05 ng/mL (ref 0.00–0.09)

## 2011-03-26 LAB — COMPREHENSIVE METABOLIC PANEL
ALT: 18 U/L (ref 0–35)
AST: 23 U/L (ref 0–37)
Albumin: 3.8 g/dL (ref 3.5–5.2)
Alkaline Phosphatase: 83 U/L (ref 39–117)
BUN: 14 mg/dL (ref 6–23)
CO2: 25 mEq/L (ref 19–32)
Calcium: 9.4 mg/dL (ref 8.4–10.5)
Chloride: 96 mEq/L (ref 96–112)
Creatinine, Ser: 0.84 mg/dL (ref 0.4–1.2)
GFR calc Af Amer: >60 mL/min (ref >60)
GFR calc non Af Amer: >60 mL/min (ref >60)
Glucose, Bld: 108 mg/dL — ABNORMAL HIGH (ref 70–99)
Potassium: 3.7 mEq/L (ref 3.5–5.1)
Sodium: 128 mEq/L — ABNORMAL LOW (ref 135–145)
Total Bilirubin: 0.7 mg/dL (ref 0.3–1.2)
Total Protein: 7.6 g/dL (ref 6.0–8.3)

## 2011-03-26 LAB — URINALYSIS, ROUTINE W REFLEX MICROSCOPIC
Bilirubin Urine: NEGATIVE
Glucose, UA: NEGATIVE mg/dL
Hgb urine dipstick: NEGATIVE
Ketones, ur: NEGATIVE mg/dL
Nitrite: NEGATIVE
Protein, ur: NEGATIVE mg/dL
Specific Gravity, Urine: 1.015 (ref 1.005–1.030)
Urobilinogen, UA: 0.2 mg/dL (ref 0.0–1.0)
pH: 7 (ref 5.0–8.0)

## 2011-03-26 LAB — LIPID PANEL
HDL: 43 mg/dL
Total CHOL/HDL Ratio: 3.1 ratio
Triglycerides: 99 mg/dL
VLDL: 20 mg/dL (ref 0–40)

## 2011-03-26 LAB — PROTIME-INR
INR: 1 (ref 0.00–1.49)
Prothrombin Time: 13.4 seconds (ref 11.6–15.2)

## 2011-03-26 LAB — AMYLASE: Amylase: 54 U/L (ref 27–131)

## 2011-03-26 LAB — GLUCOSE, CAPILLARY: Glucose-Capillary: 98 mg/dL (ref 70–99)

## 2011-03-26 LAB — APTT

## 2011-03-26 LAB — TSH: TSH: 0.953 u[IU]/mL (ref 0.350–4.500)

## 2011-03-26 LAB — LIPASE, BLOOD: Lipase: 35 U/L (ref 11–59)

## 2011-03-26 LAB — MAGNESIUM: Magnesium: 2.1 mg/dL (ref 1.5–2.5)

## 2011-03-26 MED ORDER — FLUTICASONE PROPIONATE 50 MCG/ACT NA SUSP: 2.0000 | Freq: Two times a day (BID) | NASAL | Status: DC

## 2011-03-26 MED ORDER — LORATADINE 10 MG PO TABS: 10.0000 mg | ORAL_TABLET | Freq: Every day | ORAL | Status: DC

## 2011-03-26 MED ORDER — CLINDAMYCIN HCL 150 MG PO CAPS: 150.0000 mg | ORAL_CAPSULE | Freq: Four times a day (QID) | ORAL | Status: DC

## 2011-03-26 NOTE — Telephone Encounter (Signed)
Pharmacist aware to dispense 1 bottle of flonase. RX was sent for 120 actuations.

## 2011-03-26 NOTE — Patient Instructions (Signed)
Please start clindamycin for the next 3 weeks as directed Continue your nasal saline washes 1 -2 x a day Continue your Singulair Start loratadine 10mg  daily Start fluticasone nasal spray 2 sprays each nostril twice a day Stop Symbicort for now. You will revisit with Dr Vassie Loll whether you need to go back on this medicine Follow up with Dr Vassie Loll in 3 weeks to assess your progress.

## 2011-03-26 NOTE — Progress Notes (Signed)
  Subjective:    Patient ID: [name], female    DOB: [birth year], 75 y.o.   MRN: [id number]  Shortness of Breath Associated symptoms include ear pain, rhinorrhea and wheezing. Pertinent negatives include no chest pain.  Cough Associated symptoms include ear pain, postnasal drip, rhinorrhea, shortness of breath and wheezing. Pertinent negatives include no chest pain.  Wheezing  Associated symptoms include coughing, ear pain, rhinorrhea and shortness of breath. Pertinent negatives include no chest pain.  75 yo woman w hx chronic sinusitis, rhinitis, associated cough and dyspnea. PFT's read as normal per Dr Reginia Naas notes. Contributing factors appear to be sinusitis, GERD. Since the Spring she has had more trouble with nasal drainage, sinus drainage - bloody mucous, assoc w headache. Finished 1 week of clinda (Dr Pollyann Kennedy) about a 3 months ago. She is doing NSW bid, singulair. She is on Symbicort bid, albuterol nebs prn     Review of Systems  HENT: Positive for ear pain, nosebleeds, congestion, rhinorrhea, postnasal drip and sinus pressure.   Respiratory: Positive for cough, shortness of breath and wheezing.   Cardiovascular: Negative for chest pain.       Objective:   Physical Exam Gen: Pleasant, overwt, well-nourished, in no distress,  normal affect  ENT: No lesions,  mouth clear,  oropharynx clear, sinus tenderness B,   Neck: No JVD, no TMG, no carotid bruits, stridor on expiration  Lungs: No wheeze, referred UA noise  Cardiovascular: RRR, heart sounds normal, no murmur or gallops, no peripheral edema  Musculoskeletal: No deformities, no cyanosis or clubbing  Neuro: alert, non focal  Skin: Warm, no lesions or rashes        Assessment & Plan:

## 2011-03-26 NOTE — Assessment & Plan Note (Signed)
Sounds like she is developing sinusitis again, ? Chronic sinusitis - purulent bloody drainage  May need f/u w Pollyann Kennedy, will treat like chronic sinusitis, treat allergies F/u Dr Vassie Loll  Please start clindamycin for the next 3 weeks as directed Continue your nasal saline washes 1 -2 x a day Continue your Singulair Start loratadine 10mg  daily Start fluticasone nasal spray 2 sprays each nostril twice a day Stop Symbicort for now. You will revisit with Dr Vassie Loll whether you need to go back on this medicine Follow up with Dr Vassie Loll in 3 weeks to assess your progress.

## 2011-03-26 NOTE — Assessment & Plan Note (Signed)
In addition to treating sinus dz and allergies, will try stopping the Symbicort. No AFL on PFT's, upper airway noise on exam. Suspect the HFA is irritating posterior pharynx.

## 2011-04-01 ENCOUNTER — Telehealth: Payer: Self-pay | Admitting: Pulmonary Disease

## 2011-04-01 MED ORDER — PREDNISONE 10 MG PO TABS: ORAL_TABLET | ORAL | Status: DC

## 2011-04-01 NOTE — Telephone Encounter (Signed)
Called, spoke with pt.  She is aware of pred taper instructions per Vs and aware sent in.  She verbalized understanding.

## 2011-04-01 NOTE — Telephone Encounter (Signed)
Please send script for prednisone 10 mg pills >> 3 pills for 2 days, 2 pills for 2 days, 1 pill for 2 days, 1/2 pill for 2 days.  Dispense 13 pills with no refills. 

## 2011-04-01 NOTE — Telephone Encounter (Signed)
ATC line busy x 3 WCB 

## 2011-04-01 NOTE — Telephone Encounter (Signed)
Pt was seen by RB on 03/26/11 as an acute visit - was started on clindamycin, flonase, and loratidine.  Pt states she has been taking these meds and following RB's recs but with no improvement.  Pt states wheezing is worse at times.  Mucus is now clear to brownish.  Pt thinks she needs prednisone.  I offered appt but pt does not want to come in bc she was just seen.  Also states she has problems with her back and the drive makes it worse.  She would like to know if something can be called in.  I advised I would send message to Doc of the Day to address because Dr. Vassie Loll is out of the office.  Dr. Craige Cotta, pls advise.  Thanks! [facility] in [place] Allergies verified Allergies  Allergen Reactions  . Aspirin     REACTION: regular strength  . Captopril   . Naproxen   . Pantoprazole Sodium   . Penicillins   . Sulfonamide Derivatives

## 2011-04-04 ENCOUNTER — Other Ambulatory Visit: Payer: Self-pay | Admitting: Neurological Surgery

## 2011-04-04 DIAGNOSIS — M545 Low back pain: Secondary | ICD-10-CM

## 2011-04-10 ENCOUNTER — Ambulatory Visit
Admission: RE | Admit: 2011-04-10 | Discharge: 2011-04-10 | Disposition: A | Discharge status: Home / Self Care | Payer: Medicare Other | Source: Ambulatory Visit | Attending: Neurological Surgery | Admitting: Neurological Surgery

## 2011-04-10 DIAGNOSIS — M545 Low back pain: Secondary | ICD-10-CM

## 2011-04-10 MED ORDER — GADOBENATE DIMEGLUMINE 529 MG/ML IV SOLN
13.0000 mL | Freq: Once | INTRAVENOUS | Status: AC | PRN
  Administered 2011-04-10: 13 mL via INTRAVENOUS

## 2011-04-12 ENCOUNTER — Encounter: Payer: Self-pay | Admitting: Emergency Medicine

## 2011-04-16 ENCOUNTER — Telehealth: Payer: Self-pay | Admitting: Emergency Medicine

## 2011-04-16 ENCOUNTER — Encounter: Payer: Self-pay | Admitting: Emergency Medicine

## 2011-04-16 ENCOUNTER — Ambulatory Visit (INDEPENDENT_AMBULATORY_CARE_PROVIDER_SITE_OTHER): Payer: Medicare Other | Admitting: Emergency Medicine

## 2011-04-16 ENCOUNTER — Ambulatory Visit: Payer: Medicare Other | Admitting: Emergency Medicine

## 2011-04-16 DIAGNOSIS — R05 Cough: Secondary | ICD-10-CM

## 2011-04-16 DIAGNOSIS — J45909 Unspecified asthma, uncomplicated: Secondary | ICD-10-CM

## 2011-04-16 DIAGNOSIS — J019 Acute sinusitis, unspecified: Secondary | ICD-10-CM

## 2011-04-16 MED ORDER — CLINDAMYCIN HCL 150 MG PO CAPS: 150.0000 mg | ORAL_CAPSULE | Freq: Four times a day (QID) | ORAL | Status: AC

## 2011-04-16 NOTE — Telephone Encounter (Signed)
Spoke w/ pt and she states she had originally cancelled today's apt w/ RB but then called back and is coming in today at 4:15. Pt states she was feeling some better this past week but today doesn't feel to well. Pt states she has been coughing up green phlem w/ occas specks of red blood. Pt is on an abx but feels she would like rb t evaluate her. Nothing further was needed and pt is coming in for her apt today

## 2011-04-16 NOTE — Assessment & Plan Note (Signed)
Stay off symbicort. She will discuss w Dr Vassie Loll whether she needs to restart in the future - does not seem to miss it

## 2011-04-16 NOTE — Assessment & Plan Note (Signed)
Continue NSW, loratadine, fluticasone spray.

## 2011-04-16 NOTE — Progress Notes (Signed)
  Subjective:    Patient ID: [name], female    DOB: [birth year], 75 y.o.   MRN: [id number]  Shortness of Breath Associated symptoms include ear pain, rhinorrhea and wheezing. Pertinent negatives include no chest pain.  Cough Associated symptoms include ear pain, postnasal drip, rhinorrhea, shortness of breath and wheezing. Pertinent negatives include no chest pain.  Wheezing  Associated symptoms include coughing, ear pain, rhinorrhea and shortness of breath. Pertinent negatives include no chest pain.   75 yo woman w hx chronic sinusitis, rhinitis, associated cough and dyspnea. PFT's read as normal per Dr Reginia Naas notes. Contributing factors appear to be sinusitis, GERD. Since the Spring she has had more trouble with nasal drainage, sinus drainage - bloody mucous, assoc w headache. Finished 1 week of clinda (Dr Pollyann Kennedy) about a 3 months ago. She is doing NSW bid, singulair. She is on Symbicort bid, albuterol nebs prn   ROV 04/16/11 -- follows up for her cough, rhinitis, dyspnea. She reports that he dyspnea and wheeze are better, still having green mucous from her nose, rarely with blood. Some sinus pressure, no HA. Does still clear her throat. Does nasal saline washes qd. Added loratadine last time as well as fluticasone nasal spray. Stopped Symbicort last time.    Review of Systems  HENT: Positive for ear pain, nosebleeds, congestion, rhinorrhea, postnasal drip and sinus pressure.   Respiratory: Positive for cough, improved from last time.  Cardiovascular: Negative for chest pain.       Objective:   Physical Exam Gen: Pleasant, overwt, well-nourished, in no distress,  normal affect  ENT: No lesions,  mouth clear,  oropharynx clear, sinus tenderness B,   Neck: No JVD, no TMG, no carotid bruits, stridor on expiration  Lungs: No wheeze, referred UA noise  Cardiovascular: RRR, heart sounds normal, no murmur or gallops, no peripheral edema  Musculoskeletal: No deformities, no cyanosis or  clubbing  Neuro: alert, non focal  Skin: Warm, no lesions or rashes     Assessment & Plan:

## 2011-04-16 NOTE — Patient Instructions (Signed)
Please continue your clindamycin for another 3 weeks Continue your nasal saline, fluticasone spray and loratadine Do not restart Symbicort at this time Follow with Dr Vassie Loll in 3 -4 weeks

## 2011-04-16 NOTE — Assessment & Plan Note (Signed)
Suspect that this was actually a chronic sinusitis, has now been rx for 3 weeks w clinda with improvement but not complete resolution.  - three more weeks clinda (6 wk total)  - consider Ct sinuses and return for eval by Dr Pollyann Kennedy if still having symptoms after the abx finished. - rov Dr Vassie Loll in 1 month to review

## 2011-04-21 ENCOUNTER — Other Ambulatory Visit: Payer: Self-pay | Admitting: Internal Medicine

## 2011-04-23 ENCOUNTER — Encounter: Payer: Self-pay | Admitting: Cardiology

## 2011-04-30 NOTE — Consult Note (Signed)
NAME:  [name], [name]                 ACCOUNT NO.:  [id number]   MEDICAL RECORD NO.:  [id number]          PATIENT TYPE:  INP   LOCATION:  A316                          FACILITY:  [facility]   PHYSICIAN:  Kassie Mends, M.D.      DATE OF BIRTH:  1936-10-03   DATE OF CONSULTATION:  04/16/2007  DATE OF DISCHARGE:                                 CONSULTATION   REFERRING PHYSICIAN:  [facility]   HISTORY OF PRESENT ILLNESS:  The patient is a 75 year old Caucasian  female who presented to the emergency department late last night with  complaints of chest pain.  She has been in rehabilitation at the [facility] after undergoing right knee replacement on March 27, 2007.  Her  troponin level was negative x1.  Her BNP was slightly elevated at 144,  total CK elevated at 364 but CK-MB was normal at 1.3.  Chest x-ray  revealed borderline heart size.  She states she started having pain up  and through her chest yesterday evening.  It also radiated down in her  abdomen.  She states she has had this off and on for years.  She also  gives a history of dysphagia to solids and liquids.  She states she just  cannot get the food to go down.  She denies any nausea or vomiting.  She  also has heartburn symptoms.  She is on Prilosec OTC.  She daily sees  Dr. Marina Goodell in [place] for GI symptoms.  Her last EGD was approximately  2 years according to the patient.  She has also had esophageal manometry  done in May, 2006, which revealed high resting pressure for the LES but  there was complete relaxation.  She has had a remote esophageal  manometry with similar findings as well.  During her hospitalization for  her right knee replacement she underwent a CT angio of the chest on  March 28, 2007, which revealed marked distal esophageal wall thickening  down to the GE junction and mass could not be excluded.  EGD was  recommended but patient has not seen Dr. Marina Goodell yet as an outpatient.  Patient states that she has had  some black tarry stools, the most recent  one was last week.   HOME MEDICATIONS:  1. Lopressor 100 mg daily.  2. Zyprexa 7.5 mg daily.  3. Ceftriaxone 2 gm IV q.12 h.  4. Tricor 145 mg daily.  5. Lipitor 40 mg daily.  6. Prilosec OTC daily.  7. Nasonex 2 sprays daily.  8. Lopressor 50 mg daily.  9. Synthroid 50 mcg daily.  10.Norvasc 10 mg daily.  11.Vicodin 5/500 mg q.4 h p.r.n.  12.Robaxin 500 mg every 6 hours as needed.  13.Lovenox 40 mg subcu daily.  14.Vancomycin 1 gm IV q.12 h.  15.Iron 325 mg t.i.d.  16.Zoloft 100 mg nightly.   ALLERGIES:  1. PENICILLIN.  2. SULFA.  3. CAPOTEN.  4. NOVOLIN.  5. SHE IS INTOLERANT TO NSAIDS.   PAST MEDICAL HISTORY:  1. Gastroesophageal reflux disease.  2. History of hiatal hernia and esophageal  spasm according to the      patient.  3. Abnormal esophageal manometry as outlined above.  4. Asthma.  5. Hyperlipidemia.  6. Hyponatremia.  7. Hypothyroidism.  8. History of CAD and MI, status post stent.  9. Osteoarthritis.  10.Partial hysterectomy.  11.Bilateral shoulder repair.  12.She had a left breast lumpectomy for benign disease.  13.Anxiety.  14.Depression.  15.Transient AFib.  16.History of delirium during her recent hospitalization.  17.She has had surgery for ankle fracture.  18.Reports a colonoscopy a couple of years ago which was normal.  19.Right knee replacement on March 27, 2007.   FAMILY HISTORY:  Brother had cancer and she believes it may have been  colon.  Otherwise no GI history.   SOCIAL HISTORY:  She is a remote tobacco user.  No alcohol use.  She is  a widow.  She has 2 sons.   REVIEW OF SYSTEMS:  GI:  See HPI.  CARDIOPULMONARY:  Denies any  shortness of breath.  See HPI for chest pain.   PHYSICAL EXAMINATION:  VITAL SIGNS:  Temp 98.2, pulse 83, respirations  20, blood pressure 137/91.  Weight 58.9 kg, height 62 inches.  GENERAL:  Pleasant elderly Caucasian female in no acute distress.SKIN:  Warm and  dry.  No jaundice.  HEENT:  Sclerae nonicteric.  Oropharyngeal mucosa moist and pink.  No  lesions, erythema or exudate.  NECK:  No lymphadenopathy.CHEST:  Lungs  are clear to auscultation.CARDIAC:  Exam reveals regular rate and  rhythm.  No murmurs, rubs or gallops.ABDOMEN:  Positive bowel sounds.  Abdomen is soft.  She has mild epigastric tenderness to deep palpation.  No organomegaly or masses appreciated.  No rebound tenderness or  guarding.  No abdominal bruits.EXTREMITIES:  No edema.   LABORATORY DATA:  White count 14,900, hemoglobin 12.3, hematocrit 34.1,  platelets 693,000.  INR 1.1.  Sodium 134, potassium 4.2, BUN 4,  creatinine 0.59, glucose 101, total bilirubin 0.6, alkaline phosphatase  89, AST 27, ALT 25, albumin 2.5.  Urinalysis revealed trace blood,  moderate leukocyte esterase, 7-10 WBCs.  She had an abdominal ultrasound  on April 06, 2007, which revealed a CBD of 3 mm and evidence of  gallbladder sludge.   IMPRESSION:  [name] is a 75 year old Caucasian female with  complaints of dysphagia, gastroesophageal reflux disease, history of  abnormal esophagus on recent CT.  She also reports melena last week  although her hemoglobin is normal.  She needs an  esophagogastroduodenoscopy for further evaluation and possible  therapeutic measures.  In addition she does have a history of  gallbladder sludge and cannot exclude possibility of chest pain related  to biliary disease.  Her liver function tests are normal except for low  albumin.  Given recent right knee replacement she will need to have  antibiotics prior to endoscopy.   RECOMMENDATIONS:  1. EGD, possibly today or tomorrow.  Will discuss further with Dr.      Cira Servant.  Will make her n.p.o. in case the procedure is to be done      today.  2. Proton pump inhibitor therapy.  3. Further recommendations to follow.      Tana Coast, P.A.      Kassie Mends, M.D.  Electronically Signed    LL/MEDQ  D:   04/16/2007  T:  04/16/2007  Job:  [id number]

## 2011-04-30 NOTE — Consult Note (Signed)
NAMENETASHA, WEHRLI                 ACCOUNT NO.:  [id number]   MEDICAL RECORD NO.:  [id number]          PATIENT TYPE:  INP   LOCATION:  1410                         [facility]ACILITY:  [facility]   PHYSICIAN:  Antonietta Breach, M.D.  DATE OF BIRTH:  02/26/1936   DATE OF CONSULTATION:  04/07/2007  DATE OF DISCHARGE:                                 CONSULTATION   Mrs. Cunanan has been having many hallucinations during the day.  These  have greatly disturbed her. She has had catastrophic anxiety.  These  periods are waxing and waning. She is not having any adverse Zyprexa  effects.   QTC is 470 milliseconds. WBC 21.3, hemoglobin 12.7, platelet count 502.  BUN 19, creatinine 0.61, SGOT 57, SGPT 48, albumin 2.7, calcium 8.9.  TSH is within normal limits.  The T3 is decreased at 1.6 which does  reflect reduced peripheral conversion due to her general medical  condition. Her T4 is within normal limits.   Her CT without contrast of the head reveals no acute processes.   PHYSICAL EXAMINATION:  VITAL SIGNS:  Temperature 99.2, pulse 78,  respiration 22, blood pressure 150/80, O2 saturation on 2 liters 98%.   MENTAL STATUS EXAM:  At time of exam, Mrs. Sterling is alert.  She also is  conversant. She is not oriented to the year, but she knows the month.  She knows where she is.  She notes the town, and she is also oriented to  person.  Thought process is mostly coherent. At the time of exam, the  patient is not having hallucinations.  She does have insight that she  has had a nightmare-like experience today. Her judgment is still  impaired.   ASSESSMENT:  293.00 delirium not otherwise specified.   Would increase the Zyprexa to 5 mg nightly for antipsychosis agitation.      Antonietta Breach, M.D.  Electronically Signed     JW/MEDQ  D:  04/12/2007  T:  04/12/2007  Job:  [id number]

## 2011-04-30 NOTE — Consult Note (Signed)
NAMEINDIYAH, PAONE                 ACCOUNT NO.:  [id number]   MEDICAL RECORD NO.:  [id number]          PATIENT TYPE:  INP   LOCATION:  A316                          FACILITY:  [facility]   PHYSICIAN:  Kassie Mends, M.D.      DATE OF BIRTH:  06/07/1936   DATE OF CONSULTATION:  04/16/2007  DATE OF DISCHARGE:                                 CONSULTATION   ADDENDUM:  Addendum to job #[id number].  Please note change in allergies.  The patient is allergic to penicillin which causes a local reaction at  injection site.  Sulfa cause whelps.  She is intolerant to NSAIDS.  Capoten possible allergy with tongue swelling.  Noroxin possible allergy  with tongue swelling.  It is unclear whether she is allergic to Novolin.      Tana Coast, P.A.      Kassie Mends, M.D.  Electronically Signed    LL/MEDQ  D:  04/16/2007  T:  04/16/2007  Job:  [id number]   cc:   [facility]

## 2011-04-30 NOTE — Assessment & Plan Note (Signed)
[facility]                            CARDIOLOGY OFFICE NOTE   RITAL, CAVEY                        MRN:          [id number]  DATE:03/02/2008                            DOB:          November 30, 1936    The primary is Dr. Vernon Prey.   REASON FOR PRESENTATION:  Evaluate patient with coronary disease and  history of paroxysmal atrial fibrillation.   HISTORY OF PRESENT ILLNESS:  The patient is a lovely 75 year old.  She  is due to have surgery on her spine to remove a cyst.  Of note, she had  a long hospitalization after a knee replacement last spring.  With that  prolonged hospitalization, she did not have any significant cardiac  complications.  She had some paroxysmal atrial fibrillation.   She has been limited by leg pain and weakness.  She does not do much in  the way of exercise or activity.  With her routine activities, she is  not having any chest discomfort, neck or arm discomfort.  She is not  having any palpitations with rapid rates.  However, she does  periodically have episodes where her heart beats strong and she gets  week.  She denies any shortness of breath.  She has no PND or orthopnea.   PAST MEDICAL HISTORY:  1. Coronary artery disease (catheterization October 01, 2004 with 20%      left main stenosis, 40% LAD stenosis, 70% second diagonal stenosis,      40% proximal circumflex stenosis, 40% lesion in the first obtuse      marginal, and right coronary artery 20% stenosis.  The patient had      patent stents in the ramus intermediate).  2. Gastroesophageal reflux disease.  3. Esophageal stricture.  4. Asthmatic bronchitis.  5. Fibromuscular dysplasia.  6. Osteoporosis.  7. Hyperlipidemia.  8. Right knee cartilage repair.  9. Rotator cuff surgery.  10.Lumpectomy.   ALLERGIES/INTOLERANCES:  NONSTEROIDALS, PENICILLIN, SULFA, CAPOTEN.   MEDICATIONS:  1. Imdur 30 mg daily.  2. Toprol 100 mg daily.  3. Tricor 160 mg daily.  4. Singulair.  5. Xanax.  6. Aspirin 80 mg daily.  7. Calcium.  8. Synthroid 50 mcg daily.  9. Protonix.  10.Omega 3 fatty acid.  11.Multivitamin.   REVIEW OF SYSTEMS:  As stated in the HPI and otherwise negative other  systems.   PHYSICAL EXAMINATION:  The patient is in no distress.  Blood pressure  134/80, heart rate 80 and regular, weight 157 pounds, body mass index  20.  HEENT:  Eyelids unremarkable.  Pupils equal, round, and reactive to  light.  Fundi not visualized.  Oral mucosa unremarkable.  NECK:  No jugular distention, waveform within normal limits, carotid  upstroke brisk and symmetrical.  No bruits, no thyromegaly.  LYMPHATICS:  No cervical, axillary, or inguinal adenopathy.  LUNGS:  Clear to auscultation bilaterally.  BACK:  No costovertebral tenderness.  CHEST:  Unremarkable.  HEART:  PMI not displaced or sustained, S1-S2 within normal.  No S3, no  S4, no clicks, no rubs, no murmurs.  ABDOMEN:  Mildly obese,  positive bowel sounds normal in frequency and  pitch, no bruits, rebound, no guarding, no midline pulsatile mass, no  hepatomegaly, no splenomegaly.  SKIN:  No rashes, no nodules.  EXTREMITIES:  2+ pulses throughout, no edema, no cyanosis, no clubbing.  NEUROLOGICAL:  Oriented to person, place, and time.  Cranial nerves II-  XII grossly intact, motor grossly intact throughout.   EKG:  Sinus rhythm, rate 80, axis within normal limits, intervals within  normal limits, no acute ST-T wave changes.   ASSESSMENT/PLAN:  Coronary disease.  Patient is having no symptoms  suggestive of obstructive coronary disease.  She tolerated a very  significant stress with her surgery last year.  Therefore, at this  point, I do not think there is any indication for stress testing  according to ACC/AHA guidelines.  I think the patient will be at  acceptable risk for planned surgery.  She should continue the  medications as listed, including the beta blocker.   COMMENT:  1.  Atrial fibrillation.  The patient has had no symptomatic paroxysms      of tachyarrhythmias or irregular rhythms.  She does have these      episodes of a strong regular beat where she feels weak.  I am not      clear what these are.  They are infrequent, and I probably would      not be successful in capturing them on a monitor.  I have asked her      to try to come to the doctor and get herself monitored in some way      if she is having these for any length of time.  Of note, I did      reduce her Norvasc at the last visit.  Do not think low blood      pressure is the      problem that she is describing, however.  2. Follow-up.  I will see the patient back in 6 months or sooner if      needed.     Rollene Rotunda, MD, [facility]  Electronically Signed    JH/MedQ  DD: 03/02/2008  DT: 03/02/2008  Job #: [id number]   cc:   Ernestina Penna, M.D.

## 2011-04-30 NOTE — Cardiovascular Report (Signed)
[name], [name]                 ACCOUNT NO.:  [id number]   MEDICAL RECORD NO.:  [id number]          PATIENT TYPE:  INP   LOCATION:  4731                         FACILITY:  [facility]   PHYSICIAN:  Noralyn Pick. Eden Emms, MD, FACCDATE OF BIRTH:  [birth year]   DATE OF PROCEDURE:  05/04/2009  DATE OF DISCHARGE:                            CARDIAC CATHETERIZATION   INDICATIONS:  A 75 year old patient with previous stent to the ramus.   PROCEDURE:  Cine catheterization done with 5-French catheters from right  femoral artery.   FINDINGS:  Left main coronary was normal.   Left anterior descending artery had 20-30% multiple lesions in the  proximal and mid vessel.  Distal vessel was small.  First and second  diagonal branches were small without critical disease.   The ramus branch was widely patent.  There was 20-30% in-stent  restenosis.   Circumflex coronary artery was nondominant.  There was 30-40% mid  circumflex disease.   Right coronary artery was dominant.  It was normal.  There was a single  PDA and a small posterolateral branch.   RAO ventriculography:  RAO ventriculography was normal.  EF was 60%.  There was no gradient across the aortic valve and no MR.  LV pressure  was 150/5.  Aortic pressure was 150/80.   IMPRESSION:  The patient's pain would appear to be noncardiac in  etiology.  She has significant esophageal disease and is followed by Dr.  Marina Goodell.  We will ask GI to see her here in the hospital.      Theron Arista C. Eden Emms, MD, [facility]  Electronically Signed     PCN/MEDQ  D:  05/04/2009  T:  05/04/2009  Job:  [id number]

## 2011-04-30 NOTE — Discharge Summary (Signed)
NAMESAVREEN, [name]                 ACCOUNT NO.:  [id number]   MEDICAL RECORD NO.:  [id number]          PATIENT TYPE:  INP   LOCATION:  A316                          FACILITY:  [facility]   PHYSICIAN:  Marcello Moores, MD   DATE OF BIRTH:  05-Feb-1936   DATE OF ADMISSION:  04/15/2007  DATE OF DISCHARGE:  05/05/2008LH                               DISCHARGE SUMMARY   DISCHARGE DIAGNOSIS:  1. Esophagus candidiasis, resolving.  She will be discharged with      diflucanp po.  2. Esophageal ulcers secondary to reflux disease.  She will be      discharged with proton pump inhibitor.  3. Confusion resolved.  4. Urinary tract infection, resolving.  5. Hypokalemia, resolved  6. Mild dementia  7.Anemia.  8.history of cardiac cath.  9 sligth tachy cardia.   DISCHARGE MEDICATIONS:  1. Levaquin 500 mg p.o. daily for 3 days.  2. Diflucan 100 mg p.o. daily for 7 days  3. Xanax 0.25 mg p.o. p.r.n. every 8 hours.  4. Magnesium hydroxide 30 mL p.r.n. every 8 hours.  5. Protonix 40 mg p.o. daily.  6. Colace 100 mg p.o. daily.  7. Flexeril 10 mg p.o. at bedtime.  8.lotrison cream   HOSPITAL COURSE:  [name] is a 75 year old female patient who  presented to the emergency room from [facility] with chest  discomfort and the first cardiac enzymes were normal.  She denied also  vomiting, diaphoresis or left-sided pain.  She had urinary tract  infection with mild leukocytosis and she was admitted with assessment of  urinary tract infection, esophageal spasm, hypokalemia, and confusion.  She was put on antibiotic,  Levaquin,  and GI was consulted for her  presumed esophageal spasm and EGD was done which showed esophageal  candidiasis as well as reflux ulcer.  She was put on PPI and Diflucan  was started also for her esophageal candidiasis.  Her hospital course was stable except she has difficulty swallowing.  She is improving slowly over time and I noted also that she has  difficulty  remembering things which might need evaluation for early  dementia.   PHYSICAL EXAMINATION:  VITAL SIGNS:  Today on physical examination,  blood pressure is 155/88 and temperature is 98.  Pulse rate is 95.  Respiratory rate is 20.  HEENT: She has pink conjunctivae, nonicteric sclera.  Very scanty  thrush.  NECK:  Supple.  CHEST:  Good air entry bilaterally.  CARDIOVASCULAR:  S1 and S2 regular, no murmur.  She had slight  tachycardia which I will also watch before I discharge her today.  ABDOMEN:  Soft.  No organomegaly.  EXTREMITIES:  She has no pedal edema. left knee states post surgery  slightly swellen.  CNS:  She is alert and oriented, probably mild early dementia.   LABORATORY DATA:  We have CBC on May1 which was 14.9, hemoglobin 12,  hematocrit 34 and a platelet count of 693,000.  An EGD was done by GI,  Dr. Karilyn Cota, which shows candidiasis and reflux ulcers.   PLAN:  The plan is to send  her to a nursing home, but before I send her,  I will repeat CBC to leukocytosis she had to see if it is resolving  which was thought due to urinary tract infection.  I will watch also the  pulse rate which is on the upper side and blood pressure on the upper  side.  We will repeat it before we discharge her to see there if there  is any course and she will go to the [facility].      Marcello Moores, MD  Electronically Signed     MT/MEDQ  D:  04/20/2007  T:  04/20/2007  Job:  [id number]

## 2011-04-30 NOTE — Consult Note (Signed)
[name], [name]                 ACCOUNT NO.:  [id number]   MEDICAL RECORD NO.:  [id number]          PATIENT TYPE:  INP   LOCATION:  1410                         FACILITY:  [facility]   PHYSICIAN:  Antonietta Breach, M.D.  DATE OF BIRTH:  January 26, 1936   DATE OF CONSULTATION:  04/06/2007  DATE OF DISCHARGE:                                 CONSULTATION   Mrs. [name] continues to have hallucinations and delusions.  She has  catastrophic anxiety.  She is not combative.  This has not responded to  her general medical care.  She is cooperative with p.o. medication.   The QTC is 470 msec.  She has stable vital signs.   MENTAL STATUS EXAM:  Mrs. [name] stares off in the direction of the  corner of the room.  She clearly is responding to internal stimuli.  She  has poverty of speech and is almost mute.  Her judgment is impaired.  Her insight is poor.  Her affect is very anxious.   ASSESSMENT:  293.81 psychotic disorder, not otherwise specified, would  start Zyprexa 2.5 mg q.h.s. for anti psychosis.      Antonietta Breach, M.D.  Electronically Signed     JW/MEDQ  D:  04/12/2007  T:  04/12/2007  Job:  [id number]

## 2011-04-30 NOTE — Discharge Summary (Signed)
[name], [name] NO.:  [id number]   MEDICAL RECORD NO.:  [id number]          PATIENT TYPE:  INP   LOCATION:  4731                         FACILITY:  [facility]   PHYSICIAN:  Hillis Range, MD       DATE OF BIRTH:  [birth year]   DATE OF ADMISSION:  05/03/2009  DATE OF DISCHARGE:  05/05/2009                               DISCHARGE SUMMARY   PRIMARY CARDIOLOGIST:  Rollene Rotunda, MD, [facility]   PRIMARY CARE Alix Stowers:  Ernestina Penna, MD   GASTROENTEROLOGIST:  Wilhemina Bonito. Marina Goodell, MD   DISCHARGE DIAGNOSES:  Chest and epigastric pain.   SECONDARY DIAGNOSES:  1. Coronary artery disease status post previous stenting with      nonobstructive disease by cardiac catheterization on this      admission.  2. History of esophageal stricture with esophagogastroduodenoscopy on      this admission showing normal esophagogastroduodenoscopy, duodenum,      and stomach with a Savary dilator for suspected hypertensive lower      esophageal sphincter.  3. Hyperlipidemia.  4. Gastroesophageal reflux disease.  5. Asthmatic bronchitis.  6. Fibromuscular dysplasia.  7. Osteoarthritis/osteoporosis status post right total knee      arthroplasty in 2008 and status post left rotator cuff repair in      March 2004.  8. Hiatal hernia.  9. Hypothyroidism.  10.Status post left breast lumpectomy.  11.Anxiety.  12.Depression.  13.History of paroxysmal atrial fibrillation.   ALLERGIES:  PENICILLIN, SULFA, CAPOTEN, NOVOLIN, and NSAID.   PROCEDURES:  Left heart cardiac catheterization revealing nonobstructive  disease and normal left ventricular function.  EF is 60%.  EGD showing  normal duodenum, stomach, and esophagus with biopsies from the GE  junction to rule out Barrett esophagus.  There is passage of a 16-mm  Savary dilator for suspected hypertensive LES.  No evidence of  esophagitis.   HISTORY OF PRESENT ILLNESS:  A 75 year old Caucasian female with the  above problem list.  Over the  years, she has had intermittent chest and  epigastric discomfort and has been evaluated by GI extensively.  She  recently saw Dr. Antoine Poche in April 2010 with these complaints and  underwent Myoview which was normal.  Approximately, 5 days prior to  admission, she began experiencing daily severe mid sternal epigastric  burning and pain without associated symptoms occurring predominantly in  the a.m. prior to breakfast and relieves with simultaneous nitroglycerin  and sublingual hyoscyamine.  She generally has a raw feeling in her  chest the remainder of the day and also notes dysphagia and mild  epigastric tenderness following the episode.  She was seen in the GI  office on May 19 with these complaints and it was felt that her symptoms  may be cardiac in nature and a decision was made to have her admitted by  Cardiology, via [facility].   HOSPITAL COURSE:  The patient ruled out for MI.  Note, she was treated  with GI cocktail in the ED with relief of symptoms.  Because of her  coronary artery history, decision was made to  proceed cardiac  catheterization which took place on May 20 showing nonobstructive  disease with normal LV function.  Gastroenterology was consulted and  they had high suspicion that esophageal spasm or esophagitis may be  causing her symptoms.  EGD was undertaken this morning showing normal  esophagus of stomach and duodenum.  There appear to be spasm at the LES  level and this was dilated.  There is no evidence of esophagitis.  GI  has recommended antireflux regimen including PPI and Carafate.  The  patient is being discharged to home today in good condition.   DISCHARGE LABORATORY FINDINGS:  Hemoglobin 12.7, hematocrit 36.5, WBC  8.1.  INR 1.0.  Sodium 128, potassium 3.7, chloride 96, CO2 25, BUN 14,  creatinine 0.84, glucose 108, total bilirubin 0.7, alkaline phosphatase  83, AST 23, ALT 18, total protein 7.6, albumin 3.8, calcium 9.4,  magnesium 2.1, amylase 54,  lipase 35, CK 215, MB 1.6, troponin I 0.01,  total cholesterol 132, triglyceride 99, HDL 43, LDL 69, TSH 0.953.   DISPOSITION:  The patient is being discharged home today in good  condition.  Followup plans and appointments.  We have arranged for a  followup with Dr. Antoine Poche on June 23 at 10:45 a.m. in our [facility].  She will follow up with Dr. Christell Constant as previously scheduled.  She  is to follow up with Dr. Marina Goodell in 6-8 weeks.   DISCHARGE MEDICATIONS:  1. Singulair 10 mg daily.  2. Xanax 1 mg nightly.  3. Aspirin 81 mg daily.  4. Synthroid 50 mcg daily.  5. Protonix 40 mg b.i.d.  6. Multivitamin daily.  7. Zoloft 100 mg nightly.  8. Lovaza 2 g b.i.d.  9. Crestor 20 mg daily.  10.Nasonex p.r.n.  11.Symbicort 80/4.5 two puffs b.i.d.  12.Lopressor 50 mg b.i.d.  13.Trilipix 135 mg daily.  14.Zyprexa 5 mg daily.  15.Imdur 30 mg daily.  16.Nitrofurantoin 50 mg nightly.  17.Calcium 600 mg b.i.d.  18.Carafate 1 g b.i.d.   OUTSTANDING LABORATORY STUDIES:  None.   DURATION OF DISCHARGE/ENCOUNTER:  45 minutes including physician time.      [name], ANP      Hillis Range, MD  Electronically Signed    CB/MEDQ  D:  05/05/2009  T:  05/06/2009  Job:  [id number]   cc:   Ernestina Penna, M.D.  Wilhemina Bonito. Marina Goodell, MD

## 2011-04-30 NOTE — Assessment & Plan Note (Signed)
[facility]                            CARDIOLOGY OFFICE NOTE   [name], [name]                        MRN:          [id number]  DATE:06/07/2009                            DOB:          Nov 17, 1936    PRIMARY CARE PHYSICIAN:  Ernestina Penna, MD   REASON FOR PRESENTATION:  Evaluate the patient with recent  hospitalization for chest pain.   HISTORY OF PRESENT ILLNESS:  The patient returns after recent  hospitalization for chest discomfort.  She ruled out for myocardial  infarction.  She did have a cardiac catheterization which demonstrated  LAD 20-30% stenosis, ramus intermediate 20-30% stenosis, and circumflex  30-40% stenosis.  There is no mention of a previous small diagonal which  had 70-80% stenosis.  It was deemed that this was not the etiology of  her chest discomfort.  She had a GI workup and apparently esophageal  dilatation.  It was thought that she had hypertensive lower esophageal  sphincter.  Since that time, she has had some chest discomfort.  It  radiates through her chest and down both arms.  She now associates it  with foods such as recently eating fried chicken.  She does not have it  with activity.  She is not having any new shortness of breath, PND, or  orthopnea.  She does not have any palpitation, presyncope, or syncope.   PAST MEDICAL HISTORY:  Coronary artery disease (the patient had a stent  to the ramus intermediate.  Her previous catheterization was October  2005.  As mentioned, this catheterization was May 2010.  She has  otherwise nonobstructive disease as described above), gastroesophageal  reflux disease, such esophageal stricture, asthmatic bronchitis,  fibromuscular dysplasia, osteoporosis, hyperlipidemia, right knee  cartilage repair, rotator cuff surgery, lumpectomy, back surgery for  removal of a cyst from her spine.   ALLERGIES/INTOLERANCES:  NONSTEROIDALS, PENICILLIN, SULFA, and CAPOTEN.   MEDICATIONS:  1.  Imdur 30 mg daily.  2. Singulair.  3. Xanax.  4. Aspirin 81 mg daily.  5. Synthroid 50 mg daily.  6. Protonix 40 mg daily.  7. Multivitamin.  8. B complex.  9. Zoloft 100 mg daily.  10.Lovaza.  11.Crestor 20 mg daily.  12.Nasonex.  13.Symbicort.  14.Lopressor 50 mg b.i.d.  15.Nitroglycerin  16.Trilipix 135 mg daily.  17.Zyprexa 5 mg daily.  18.Sucralfate 1 g b.i.d.   REVIEW OF SYSTEMS:  As stated in the HPI, otherwise negative for all  other systems.   PHYSICAL EXAMINATION:  GENERAL:  The patient is in no distress.  VITAL SIGNS:  Blood pressure 128/68, heart rate 66 and regular.  HEENT:  Eyelids unremarkable, pupils equal, round, and reactive to  light, fundi not visualized, oral mucosa unremarkable.  NECK:  No jugular venous distention at 45 degrees, carotid upstroke  brisk and symmetric, no bruits, no thyromegaly.  LYMPHATICS:  No adenopathy.  LUNGS:  Clear to auscultation bilaterally.  BACK:  No costovertebral mass.  CHEST:  Unremarkable.  HEART:  PMI not displaced or sustained, S1 and S2 within normal, no S3,  no S4, no clicks, no rubs,  no murmurs.  ABDOMEN:  Flat, positive bowel sounds, normal in frequency and pitch, no  bruits, no rebound, no guarding, no midline pulsatile mass, no  hepatomegaly, no splenomegaly.  SKIN:  No rashes, no nodules.  EXTREMITIES:  Pulses 2+, no edema.   ASSESSMENT AND PLAN:  1. Coronary disease.  The patient has nonobstructive coronary disease      as described.  At this point, no further cardiovascular testing is      suggested.  She will continue with the medications as listed and      secondary risk reduction.  2. Atrial fibrillation.  The patient has had no paroxysms of this.  No      further cardiovascular testing is suggested.  3. Hypertension.  Her blood pressure is controlled with the      medications listed, and she will remain on these.  4. Dyslipidemia.  Per [employer] with goal LDL less than 100      and HDL  greater than 50.     Rollene Rotunda, MD, [facility]  Electronically Signed    JH/MedQ  DD: 06/07/2009  DT: 06/08/2009  Job #: [id number]   cc:   Ernestina Penna, M.D.

## 2011-04-30 NOTE — Consult Note (Signed)
NAMEARENA, LINDAHL                 ACCOUNT NO.:  [id number]   MEDICAL RECORD NO.:  [id number]          PATIENT TYPE:  INP   LOCATION:  1410                         FACILITY:  [facility]   PHYSICIAN:  Antonietta Breach, M.D.  DATE OF BIRTH:  1936-12-04   DATE OF CONSULTATION:  04/09/2007  DATE OF DISCHARGE:                                 CONSULTATION   Mrs. Jasmer continues with waxing and waning delusions and  hallucinations at the time of exam today.  She has poverty of speech,  and she is very afraid to talk; however, she slowly begins to describe  with very few words that there are gangs that are threatening her.   She has been tremulous and holding her arms to the side.  She is not  having any adverse Zyprexa effects.   Her QTC is within normal limits.  VITAL SIGNS:  Temperature 98.4, pulse  66, respiration 21, blood pressure 143/69.   PHYSICAL EXAMINATION:  Examination of bilateral elbows with passive  range of motion reveals no cogwheeling or rigidity.  The patient's  posturing clearly reflects that she is scared.  She is holding her arms  beside and has a slight tremor.   MENTAL STATUS EXAM:  Mrs. Hailu is alert.  She is staring at one place  in the room with poverty of speech.  She is oriented to self.  Thought  process is coherent.  Judgment is impaired.  Insight is poor.  Thought  content, delusions as described.   ASSESSMENT:  293.00 delirium, not otherwise specified versus 293.81  psychotic disorder, due to general medical problems with delusions.  This probably is meeting the criteria for delirium however; during the  increased psychosis, the patient is not able to talk enough to clarify  whether she has disorientation.  Also, it is unclear whether her thought  process is significantly disorganized.   RECOMMENDATIONS:  Would increase the Zyprexa to 7.5 mg q.h.s. for anti-  psychosis and agitation.      Antonietta Breach, M.D.  Electronically Signed     JW/MEDQ   D:  04/12/2007  T:  04/12/2007  Job:  [id number]

## 2011-04-30 NOTE — Discharge Summary (Signed)
[name], [name]                 ACCOUNT NO.:  [id number]   MEDICAL RECORD NO.:  [id number]          PATIENT TYPE:  INP   LOCATION:  1225                         [facility]ACILITY:  [facility]   PHYSICIAN:  Ladell Pier, M.D.   DATE OF BIRTH:  [birth year]   DATE OF ADMISSION:  03/27/2007  DATE OF DISCHARGE:                               DISCHARGE SUMMARY   DISCHARGE DIAGNOSES:  1. Status post total right knee replacement.  2. Degenerative arthritis.  3. Coronary artery disease.  4. Asthma.  5. Hypothyroidism.  6. Gastroesophageal reflux disease.  7. Dyslipidemia.  8. Coronary artery disease status post myocardial infarction and      stent.  9. Hysterectomy.  10.Ankle fracture in the past.  11.History of left breast lumpectomy.  12.History of rotator cuff tear.  13.Hyponatremia.  14.Anxiety/depression/insomnia.  15.Delirium.  16.Question of meningitis on lumbar puncture.  Infectious disease does      not think this is meningitis.  17.Transient atrial fibrillation that has resolved.  18.Hallucinations.  19.Tachycardia that has resolved.  20.Abnormal liver function tests.  Ultrasound of the liver showed      biliary sludge.  21.Anemia that has resolved.  22.Diarrhea that has resolved.  23.CT scan showed a question of esophageal mass versus inflammation.      The patient to follow up outpatient with Dr. Marina Goodell for possible      upper endoscopy.   MEDICATIONS:  1. Iron sulfate 325 mg t.i.d.  2. TriCor 145 mg q.h.s.  3. Lipitor 40 mg q.h.s.  4. Zoloft 100 mg q.h.s.  5. Protonix 40 mg b.i.d.  6. Synthroid 50 mcg daily.  7. Nasonex two sprays per nostril daily.  8. Amlodipine 10 mg daily.  9. Lopressor 50 mg q.a.m. and 100 mg q.p.m.  10.Zyprexa 7.5 mg q.h.s.  11.Lovenox 40 mg subcu daily.  12.Ceftriaxone 2 g IV q.12h.  13.Vancomycin 1 g IV q.12h.   HISTORY OF PRESENT ILLNESS:  The patient is a 75 year old white female  that was admitted for total knee replacement.  She had a  history of  arthritis which failed conservative management.  After surgery, medicine  was consulted to help with the patient's medical management.  Past  medical history, family history, social history, medications, allergies,  review of systems per admission H&P.   PHYSICAL EXAMINATION:  VITAL SIGNS:  Temperature 97.1, pulse 74, blood  pressure 151/78, respirations 22, pulse of 99% on 2 L.  HEENT:  Head is normocephalic, atraumatic.  Pupils reactive to light.  Throat:  No erythema.  CARDIOVASCULAR:  Regular rate and rhythm.  LUNGS:  Clear bilaterally.  ABDOMEN:  Positive bowel sounds.  EXTREMITIES:  Without edema.   HOSPITAL COURSE:  #1 - CONFUSION/DELIRIUM/DEPRESSION.  During the course  of the hospitalization the patient became very confused with delirium,  hallucinating.  Psychiatry was consulted.  The patient was placed on  Zyprexa but she continued to be delirious.  Also of note, her white  count and platelet count started increasing.  She was transferred to the  ICU for close observation.  She had an LP done to  further sort out or  work up her delirium.  LP had high glucose and low protein and high  white cell count, but on discussion with Dr. Roxan Hockey it is unlikely for  someone to get hospital-acquired meningitis.  The patient was, however,  placed empirically on vancomycin and Rocephin.  Her white count went  from 20,000 now down to 13,000.  Mental status improved.  Neurology was  also consulted.  While the patient was having the tap it was noted that  she had a lot of red blood cells in the spinal fluid.  I was consulted  by radiologist and another CT scan of the head was done.  The CT scan  was negative for intracranial hemorrhage.  However, the CSF fluid was  bloody.  Neurology was consulted and does not think the patient had  intracranial bleed.   #2 - LEUKOCYTOSIS.  As mentioned above, leukocytosis improved from  21,000 now done to 13,000.   #3 - HYPONATREMIA.  The  patient had an hyponatremia during  hospitalization, and now it is stable at about 130.  Most likely the  majority of her hyponatremia was due to fluids.   #4 - CORONARY ARTERY DISEASE.  The patient had no chest pain during her  hospitalization.   #5 - HYPOTHYROIDISM.  She was continued on her thyroid medications and  her TSH remained stable.   #6 - GASTROESOPHAGEAL REFLUX DISEASE.  She was continued on a PPI.  Symptoms remained stable.   #7 - ANEMIA.  She is on iron and her anemia remained stable.   #8 - ABNORMAL CAT SCAN SHOWING A QUESTION OF ESOPHAGEAL PATHOLOGY.  The  patient normally sees Dr. Marina Goodell and will follow up outpatient with Dr.  Marina Goodell for upper endoscopy.  This was discussed with the patient.   LABORATORIES:  Sodium 130, potassium 3.9, chloride 98, CO2 23, glucose  101, BUN 8, creatinine 0.45.  WBC 13.8, hemoglobin 13.2, platelets 654.  The Gram's stain was negative for the CSF.  The Gram's stain was  negative for the knee.  Blood cultures x2 negative.  CSF culture was  negative.  CSF glucose 37, CSF protein greater than 200.  CSF fluid with  6000 white cells; 20,000 red cells; and it was turbid.  TSH 1.820.  CT  of the head:  No evidence of  intracranial aneurysm, focal lesion or abnormal intracranial  enhancement.  Status post right total knee arthroplasty no acute or  significant findings.  Ultrasound of the abdomen showed gallbladder  sludge, no wall thickening or pericholecystic fluid.      Ladell Pier, M.D.  Electronically Signed     NJ/MEDQ  D:  04/10/2007  T:  04/10/2007  Job:  [id number]   cc:   Erasmo Leventhal, M.D.  Fax: [phone]   Wilhemina Bonito. Marina Goodell, MD  [address]  [place]  [place] [address]

## 2011-04-30 NOTE — H&P (Signed)
[name], [name]                 ACCOUNT NO.:  [id number]   MEDICAL RECORD NO.:  [id number]          PATIENT TYPE:  INP   LOCATION:  A316                          FACILITY:  [facility]   PHYSICIAN:  Dorris Singh, DO    DATE OF BIRTH:  11/18/1936   DATE OF ADMISSION:  04/15/2007  DATE OF DISCHARGE:  LH                              HISTORY & PHYSICAL   The patient is a 75 year old Caucasian female who presented from the  [facility] to the emergency room with a complaint of chest  discomfort.  She said the discomfort started about a week ago and she  had told the staff over at [facility] that it is back.  She was not  taking any medication for that.  She had several episodes of this.  She  does report a history of hiatal hernia and esophageal spasm and had been  treated with hyoscyamine in the past but since she has been in rehab has  forgotten to take the medication if not given.  The patient stated that  the pain mainly stayed in the bilateral epigastric region.  It does not  radiate.  At its worst here in the ED on my examination, it was rated as  a 6 out of 10 and she denies any nausea, vomiting, diaphoresis, or left  pain or jaw pain with this.   PAST MEDICAL HISTORY:  The patient is the historian.  She is currently  here at the [facility] for arthroplasty of the right  leg.  Her past medical history includes asthma, GERD, hyperlipidemia,  hyponatremia, hypothyroidism, myocardial infarction, osteoarthritis, and  esophageal spasms.  The patient states that she has had hysterectomy  without BSO, a right knee replacement just recently on March 27, 2007 as  well as a right knee, right foot repair with a compound fracture.   SOCIAL HISTORY:  Currently, she is a nonsmoker, nondrinker, and denies  any drug use.  The patient's relative that was contacted for her  admission to the hospital was her son, [name]; his number is [phone].   ALLERGIES:  THE  PATIENT STATES THAT SHE HAS ALLERGIES TO PENICILLIN AND  SULFA DRUGS.  HERE IN THE ER REPORTS THAT SHE HAS ALLERGIES TO NSAIDS.  UPON DISCUSSION WITH THE PATIENT STATES THAT ONLY IF SHE TAKES TOO MANY  ASPIRIN DOES IT MAKE HER HEART RATE INCREASE SO THIS IS NOT A TRUE  ALLERGY.   CURRENT MEDICATIONS:  The patient currently is on several medications.  She is on aspirin, Lopressor, Zyprexa, vancomycin oral, Lipitor, Zoloft,  Prilosec, and Nasonex.   REVIEW OF SYSTEMS:  Review of systems basically is negative for weight  loss, weakness, fever, or chills.  Eyes:  Negative for changes in  vision.  EENT:  Negative for ear pain, throat soreness, sinus pressure,  or rhinorrhea.  CARDIOVASCULAR:  Positive for chest pain.  RESPIRATORY:  Negative for cough, chest pain, wheezing, or orthopnea.  GASTROINTESTINAL:  Positive for cramps, dyspnea, and dysphagia.  Negative for nausea, vomiting, or diarrhea.  GU:  Positive for hesitancy  and bladder spasms.  MUSCULOSKELETAL:  Positive for arthritis.  SKIN:  Positive for bruising; she received a shot that was given at the [facility].  NEUROLOGIC:  The patient did have a new-onset of confusion  while in the ED.   PHYSICAL EXAMINATION:  VITAL SIGNS:  Upon admission to the emergency  room were 125/53, respiratory rate 28, temperature 98.2.  She was  saturating 100% on room air.  She was respirated to air tent.  This is a  well-developed, well-nourished, well-hydrated Caucasian female who was  in no acute distress; appropriate with most questions however is  forgetful in finishing sentences at this point in time.  HEAD AND FACE:  Normocephalic, atraumatic.  Eyes are EOMI, PERLA,  symmetrical; no drainage.  ENT:  Ears-TMI, visualized bilaterally.  Throat:  Positive partial  dentures.  Teeth in poor repair.  NECK:  Supple; no lymphadenopathy.  CARDIOVASCULAR:  Regular rate and rhythm.  No murmur, rub, or gallop;  S1, S2 heard.  Breath sounds are clear to  auscultation bilaterally.  No  wheezes, rales, or rhonchi.  ABDOMEN:  Soft, nontender, nondistended; no masses; positive multiple  sites of bruising along the lower abdomen.  EXTREMITIES:  Right leg is status post arthroplasty; no edema  bilaterally.  NEUROLOGIC:  Cranial nerves II-XII are grossly intact.  SKIN:  Normal turgor except for extremities and no rash or petechiae  noted.   LABORATORY DATA:  While in the ER, the patient had several tests done.  EKG was done which was normal sinus rhythm; normal PQRS, normal ST and T  wave.  Chest pain was no acute changes from her last visit.  Also, she  had a UA done which showed her urine was hazy and yellow.  She had  moderate leukocyte esterase in her urine.  However the nitrates were  negative.  Also, a lipase was done which was 57.  She had one round of  cardiac markers; the myoglobin was 44, CK-MB was less than 1, and the  troponin was 0.05.  She had a CMP done.  Sodium was 130.  Potassium 3.0.  Chloride 97.  Carbon dioxide 26.  Glucose 104.  BUN 7.  Creatinine 0.45.  CBC was done 10.6 WBC, hemoglobin 11.5, hematocrit 30.4, platelets 616.   ASSESSMENT/PLAN:  Our plan is to admit [name] with:  1. Urinary tract infection.  2. Confusion.  3. Hypokalemia.  4. Esophageal spasm.   We will go ahead and have GI to consult and participate regarding  esophageal spasm.  We will place the patient back on her medications  daily up to 4 times daily if needed.  We will start her on IV  antibiotics.  We will replace her potassium and give her pain  management.      Dorris Singh, DO  Electronically Signed     CB/MEDQ  D:  04/16/2007  T:  04/16/2007  Job:  [id number]

## 2011-04-30 NOTE — H&P (Signed)
[name], [name] NO.:  [id number]   MEDICAL RECORD NO.:  [id number]          PATIENT TYPE:  EMS   LOCATION:  MAJO                         FACILITY:  [facility]   PHYSICIAN:  [name] Abed, MD, FACCDATE OF BIRTH:  07/07/1936   DATE OF ADMISSION:  05/03/2009  DATE OF DISCHARGE:                              HISTORY & PHYSICAL   PRIMARY CARDIOLOGIST:  [name] Rotunda, MD, [facility].   PRIMARY CARE [name]:  [name] Penna, MD   GASTROENTEROLOGIST:  [name]. [name] Goodell, MD   PATIENT PROFILE:  A 75 year old Caucasian female with prior history of  CAD who presents with a 5-day history of recurrent epigastric and  substernal chest discomfort and burning.   PROBLEMS:  1. Coronary artery disease.      a.     Status post percutaneous coronary intervention and       Rotablator as well as bare-metal stenting to the ramus intermedius       in April 2003 with placement of 2.25 x 16-mm Express II bare-metal       stent.      b.     April 05, 2002, cardiac catheterization revealing patent       ramus intermedius stent with otherwise nonobstructive disease with       the exception of a small subtotal occlusion of the diagonal.      c.     October 2005, cardiac catheterization, unchanged anatomy       from April 2003.  2. Hyperlipidemia.  3. Gastroesophageal reflux disease.  4. History of esophageal stricture.  5. Hiatal hernia.  6. Asthmatic bronchitis.  7. Fibromuscular dysplasia.  8. Osteoarthritis/osteoporosis, status post right total knee      arthroplasty in 2008.  9. Status post left rotator cuff repair in March 2004.  10.Hypothyroidism.  11.Status post left breast lumpectomy.  12.Anxiety.  13.Depression.  14.History of paroxysmal atrial fibrillation.   HISTORY OF PRESENT ILLNESS:  A 75 year old Caucasian female with the  above problem list.  Over the years, she has had intermittent chest and  epigastric pain and has been evaluated by GI and diagnosed with GERD,  esophagitis, hiatal hernia, and esophageal stricture.  She saw Dr.  Antoine [name] with similar complaints in April 2010 and a Myoview was  undertaken and was normal.  Since Friday, she has been having daily  severe mid sternal epigastric burning, pain without associated symptoms  occurring in the morning prior to breakfast.  She takes sublingual  nitroglycerin and sublingual hyoscyamine back to back and symptoms  resolve within 2-3 minutes.  The remainder of the day, she often will  feel wiped out and also notes a raw feeling in her chest and throat as  well as associated dysphasia and mild dyspnea and epigastric tenderness.  She had recurrent symptoms today and called the office and was advised  to come in for evaluation.  Currently, she denies pain, but reports that  raw sensation in her throat and esophagus.   ALLERGIES:  1. PENICILLIN.  2. SULFA.  3. CAPOTEN.  4. NOVOLIN.  5. NSAIDs.  HOME MEDICATIONS:  1. Singular 10 mg daily.  2. Xanax 1 mg nightly.  3. Aspirin 81 mg daily.  4. Synthroid 50 mcg daily.  5. Protonix 40 mg b.i.d.  6. Multivitamin daily.  7. Zoloft 100 mg nightly.  8. Lovaza 2 g b.i.d.  9. Crestor 20 mg daily.  10.Nasonex p.r.n.  11.Symbicort 80/4.5 mg 2 puffs b.i.d.  12.Lopressor 50 mg b.i.d.  13.Trilipix 135 mg daily.  14.Zyprexa 5 mg daily.  15.Imdur 30 mg daily.  16.Nitrofurantoin 50 mg nightly.  17.Calcium 600 mg b.i.d.   FAMILY HISTORY:  Mother died of an MI at 83.  Father died in a motor  vehicle accident at 85.  Brother died of prostate cancer and stroke.   SOCIAL HISTORY:  She lives in [place] by herself.  She is retired from a  Dentist, also worked in Airline pilot.  She has a 15-pack-a-year  history of tobacco abuse, quitting about 20 years ago.  She denies  alcohol or drugs.  She is not routinely exercising.   REVIEW OF SYSTEMS:  Positive for chest and epigastric discomfort and  GERD-like symptoms.  She does have intermittent nausea, not  associated  with symptoms.  She has had dyspnea on exertion since Friday.  She is a  full code.  Otherwise, all systems reviewed are negative.   PHYSICAL EXAMINATION:  VITAL SIGNS:  Temperature 97.7, heart rate 58,  respirations 16, blood pressure 152/65, and pulse ox 97% on room air.  GENERAL:  A pleasant white female in no acute distress.  Awake, alert,  and oriented x3 and normal affect.  NEUROLOGIC:  Grossly intact, nonfocal.  SKIN:  Warm and dry without lesions or masses.  MUSCULOSKELETAL:  Grossly normal without deformity or effusion.  HEENT:  Normal.  NECK:  Supple without bruits or JVD.  LUNGS:  Respirations are regular and unlabored.  Clear to auscultation.  CARDIAC:  Regular S1 and S2.  No S3, S4, or murmurs.  ABDOMEN:  Round, soft, and nondistended.  She does have mild epigastric  tenderness.  Bowel sounds present x4.  EXTREMITIES:  Warm, dry, and pink.  No clubbing, cyanosis, or edema.  Dorsalis pedis and posterior tibial pulses 2+ and equal bilaterally.   Chest x-ray is pending.  EKG is sinus bradycardia with a rate of 58, no  acute ST-T changes, and normal axis.  Lab work is pending.   ASSESSMENT:  1. Chest and epigastric pain, somewhat atypical for angina.  She has      this lingering raw sensation in her throat and chest with mild      dysphagia and epigastric tenderness.  We will try gastrointestinal      cocktail in the emergency department.  Plan to admit and cycle      cardiac markers.  Continue home medications.  She had a recent      negative Myoview.  If enzymes remain negative, consider      Gastrointestinal consult as she is followed by Dr. Marina [name].  If the      enzymes positive, plan for catheterization.  Continue b.i.d. proton      pump inhibitors.  2. Gastroesophageal reflux disease/hiatal hernia/esophageal stricture.      See #1.  3. Hypothyroidism.  Check TSH.  Continue Synthroid.  4. Hyperlipidemia.  Check lipids and LFTs.  Continue statin and       Lovaza.      [name], ANP      [name] Abed, MD, [facility]  Electronically Signed    CB/MEDQ  D:  05/03/2009  T:  05/04/2009  Job:  [id number]

## 2011-04-30 NOTE — Consult Note (Signed)
[name], [name]                 ACCOUNT NO.:  [id number]   MEDICAL RECORD NO.:  [id number]          PATIENT TYPE:  INP   LOCATION:  1225                         FACILITY:  [facility]   PHYSICIAN:  Marlan Palau, M.D.  DATE OF BIRTH:  07/13/1936   DATE OF CONSULTATION:  04/08/2007  DATE OF DISCHARGE:                                 CONSULTATION   HISTORY OF PRESENT ILLNESS:  [name] is 75 year old, white female  born [birth year], with a history of severe degenerative arthritis  affecting the right knee.  This patient has come in for total knee  replacement.  This was done on March 27, 2007.  The patient was noted to  have an episode of syncope while going back to bed on April 12, but  seemed to recover fairly well.  The patient was first noted to have some  elevation in white blood count by April 16, and became slightly confused  that evening and has been confused ever since.  The patient underwent a  CT scan of the brain on April 17, which was unremarkable.  A lumbar  puncture was performed due to persistent leukocytosis.  This revealed a  bloody tap with 20,000 red cells, 6000 white cells, greater than 200  protein and glucose of 37.  The patient was felt to have meningitis and  has been followed by infectious disease.  The infectious disease consult  felt that the spinal fluid was consistent more with hemorrhage than  bacterial meningitis.  Neurology was called for further evaluation.  The  patient has not seemed to complain of headache, at least this is not  recorded in the notes and she is not complaining of it now.  She does  note some neck stiffness and some back pain that is record in the notes.   PAST MEDICAL HISTORY:  1. History of altered mental status, questionable meningitis versus      subarachnoid hemorrhage.  2. Degenerative arthritis, right knee, status post total knee      replacement.  3. Coronary artery disease.  4. Asthma.  5. Hypothyroidism.  6.  Gastroesophageal reflux disease.  7. Dyslipidemia.  8. Coronary artery disease, history of MI and stent.  9. Hysterectomy.  10.Ankle fracture in the past.  11.Left breast lumpectomy.  12.History of rotator cuff tear repair.   SOCIAL HISTORY:  This patient lives in the area of [place], [place].  The patient is a widow and no longer works.   FAMILY HISTORY:  Noted for high blood pressure, heart disease,  cerebrovascular disease, cancer and arthritis.   REVIEW OF SYSTEMS:  No reports of headache.  The patient does note neck  stiffness.  Denies shortness of breath, chest pains, abdominal pain.  Denies any numbness or weakness of arms or legs.  The patient reports no  visual changes.   PHYSICAL EXAMINATION:  VITAL SIGNS:  Blood pressure is 126/63, heart  rate 77, respiratory rate 25.  The patient is afebrile with T-max of  99.1.  GENERAL:  This patient is a fairly well-developed, white female  who is  sleepy, but easily aroused at the time examination.  HEENT:  Head is atraumatic.  Pupils equal, round and reactive to light.  Disks are flat bilaterally.  NECK:  Supple.  No carotid bruits noted.  RESPIRATORY:  Examination is clear.  CARDIOVASCULAR:  Regular rate and rhythm.  No obvious murmurs or rubs  noted.  The right knee is braced.  The left leg without significant  edema.  NEUROLOGIC:  Cranial nerves as above.  Facial symmetry is present.  The  patient's head is slightly tilted backwards and turn to the right.  Neck  is quite stiff.  Extraocular movements are full.  Visual fields are  full.  Speech is slightly dysarthric.  The patient has symmetric smile.  The patient reports good symmetric pinprick sensation on the face.  The  patient is oriented to person, place, not date.  The patient has good  symmetry in pinprick, soft touch and vibratory sensation throughout.  The patient is unable to perform finger-to-nose with left arm, can  perform with the right.  Difficulty  elevating the left arm up over her  head up.  The patient can elevate the left leg up off the bed.  Did not  elevate the right leg.  Can wiggle the toes on both feet.  The patient  has fair, but slightly depressed reflexes on the arms symmetric reflexes  on the legs.  Toes neutral bilaterally.   LABORATORY DATA AND X-RAY FINDINGS:  Sodium 133, potassium 4, chloride  of 101, CO2 22, glucose of 111, BUN of 12, creatinine 0.5, calcium 8.7,  hemoglobin of 12.8, hematocrit 37.9, white count of 22.9, platelets of  327.   The patient has a sodium of 140, potassium 3.1, chloride of 103, bicarb  30, glucose 170, BUN of 29, creatinine 1.29, calcium 8.5.  Total protein  5.5, albumin 2.4, AST of 73, ALT of 87, total bilirubin 2.8, Alk phos  81.  Troponin I 0.03, MB fraction 4.2, CK level of 46.   IMPRESSION:  1. Altered mental status secondary to meningitis versus subarachnoid      hemorrhage.  2. Status post right total knee replacement.   CT of the head on two occasions has been unremarkable.  No evidence of  subarachnoid blood present.  Certainly, subarachnoid blood can be missed  on CT scan evaluations.  Will pursue a bit further workup to exclude a  source of subarachnoid blood.  Subarachnoid blood can result in a  chemical meningitis with elevated CSF white count and systemic fever.   RECOMMENDATIONS:  CT angiogram of intracranial vessels.  If this is  unremarkable, would consider at some point obtaining MRI study of the  cervical and thoracic spine.  Subarachnoid hemorrhage could potentially  be from a spinal source.  Will follow the patient while in-house.      Marlan Palau, M.D.  Electronically Signed     CKW/MEDQ  D:  04/08/2007  T:  04/09/2007  Job:  [id number]   cc:   [facility]

## 2011-04-30 NOTE — Assessment & Plan Note (Signed)
[facility]                            CARDIOLOGY OFFICE NOTE   [name], [name]                        MRN:          [id number]  DATE:04/22/2007                            DOB:          02/09/1936    PRIMARY:  Dr. Vernon Prey.   REASON FOR PRESENTATION:  Evaluate patient with recent hospitalization  for knee surgery and paroxysmal atrial fibrillation during that  hospitalization.   HISTORY OF PRESENT ILLNESS:  The patient returns for followup.  She had  a long, complicated hospitalization following knee surgery.  Cardiac  complications included some transient atrial fibrillation.  Her  hospitalization was complicated by confusion, delirium, leukocytosis,  hyponatremia, abnormal liver function tests, diarrhea.  The patient was  discharged to [facility] for rehab.  She had to be hospitalized 5 days  later with chest pain and was found to have esophagitis.  She now is  back in rehab.  She says she has been doing some physical therapy.  She  thinks she is slowly getting stronger, but she is still profoundly weak.  She is able to stand at her walker.  She may get lightheaded with this.  She is having less chest discomfort.  This has been a burning discomfort  associated with difficulty swallowing.  She is not having any chest  pressure or neck discomfort consistent with angina.  She is having no  new shortness of breath.  She denies any PND or orthopnea.  She has had  no palpitations, presyncope or syncope.  She has been nauseated.   PAST MEDICAL HISTORY:  1. Coronary artery disease (catheterization October 01, 2004 with 20%      left main stenosis, 40% LAD stenosis, 70% 2nd diagonal stenosis,      40% proximal circumflex stenosis, 40% lesion in the 1st obtuse      marginal, right coronary artery 20% stenosis.  The patient had      patent stents in the ramus intermediate).  2. Gastroesophageal reflux disease.  3. Esophageal stricture.  4. Asthmatic  bronchitis.  5. Fibromuscular dysplasia.  6. Osteoporosis.  7. Hyperlipidemia.  8. Right knee cartilage repair.  9. Rotator cuff surgery.  10.Lumpectomy.   ALLERGIES:  INTOLERANCE TO NONSTEROIDALS, PENICILLINS, SULFA, CAPOTEN,  ALL TYPES OF ASPIRIN.   MEDICATIONS:  (From her nursing home note)  1. Toprol XL 100 mg q.a.m. and 50 mg q.p.m.  2. Tricor 160 mg daily.  3. Synthroid 50 mcg daily.  4. Protonix 40 mg daily.  5. Lovenox.  6. Iron 325 mg t.i.d.  7. Zyprexa 75 mg q.p.m.  8. Norvasc 5 mg daily.   REVIEW OF SYSTEMS:  As stated in the HPI and otherwise negative for  other systems.   PHYSICAL EXAMINATION:  The patient is in no distress.  Blood pressure 116/80, heart rate 91 and regular, weight 122 pounds.  HEENT:  Eyelids unremarkable, pupils are equal, round, and reactive to  light, fundi not visualized.  Oral mucosa unremarkable.  NECK:  No jugular venous distension at 90 degrees, carotid upstroke  brisk and symmetrical.  No bruit.  No thyromegaly.  LYMPHATICS:  No cervical, axillary, inguinal adenopathy.  LUNGS:  Clear to auscultation bilaterally.  BACK:  No costovertebral angle tenderness.  CHEST:  Unremarkable.  HEART:  PMI not displaced or sustained.  S1 and S2 are within normal  limits.  No S3, no S4.  No clicks, no rubs, no murmurs.  ABDOMEN:  Flat, positive bowel sounds, normal in frequency and pitch.  No bruits, no rebound.  Unable to appreciate hepatomegaly, midline  pulsatile mass or bruit.  SKIN:  No rashes, no nodules.  EXTREMITIES:  2+ pulses, the right knee has ecchymosis and a healing  knee wound.  NEURO:  Oriented to person, place, and time.  Cranial nerves II-XII  grossly intact, motor grossly intact.   EKG sinus rhythm, rate 91, left axis deviation, QT mildly prolonged, no  acute ST-T wave changes.   ASSESSMENT AND PLAN:  1. Coronary disease.  The patient is having no symptoms suggestive of      obstructive coronary disease or ongoing issue with  this.  We will      concentrate on reestablishing risk reduction as she recovers from      this surgery.  I am not going to start a statin back as she has      some nausea, and I do not want to exacerbate any of this.  In fact,      I am going to give her Zofran for the nausea.  2. Atrial fibrillation.  The patient had a paroxysmal dyspnea in the      hospital that self corrected.  She will continue on the beta      blocker because she has some slight sinus tachycardia.  This will      also be for secondary prevention.  3. Lightheadedness.  The patient is having some lightheadedness.  Her      blood pressure is on the low side.  For now, I am going to reduce      the Norvasc 2.5 mg daily.  I might back down      on the beta blocker, too, if she continues to have this problem.  4. Followup.  I will see her back in about 6 weeks for any further med      titration.     Rollene Rotunda, MD, [facility]     JH/MedQ  DD: 04/22/2007  DT: 04/22/2007  Job #: [id number]   cc:   Ernestina Penna, M.D.

## 2011-04-30 NOTE — Op Note (Signed)
NAMEZETA, BUCY NO.:  [id number]   MEDICAL RECORD NO.:  [id number]          PATIENT TYPE:  INP   LOCATION:  3018                         FACILITY:  [facility]   PHYSICIAN:  Tia Alert, MD     DATE OF BIRTH:  1936-06-29   DATE OF PROCEDURE:  04/13/2008  DATE OF DISCHARGE:                               OPERATIVE REPORT   PREOPERATIVE DIAGNOSIS:  Intradural extramedullary cystic mass T10-L2.   POSTOPERATIVE DIAGNOSIS:  Intradural extramedullary cystic mass T10-L2.   PROCEDURE:  A thoracolumbar laminectomy for fenestration of intradural  extramedullary arachnoid cyst utilizing microscopic dissection.   SURGEON:  Tia Alert, MD   ASSISTANT:  Donalee Citrin, MD   ANESTHESIA:  General endotracheal.   COMPLICATIONS:  None apparent.   INDICATIONS FOR PROCEDURE:  Ms. Molnar is a 75 year old female who had  about a year long history of progressive leg pain.  She had an MRI and  then a CT myelogram which showed a large intradural extramedullary  cystic lesion from T10-L2.  I recommended a thoracolumbar laminectomy  for fenestration of the cyst.  I felt this was likely an arachnoid cyst.  She understood the risks, benefits, and suspected outcome and wished to  proceed.   DESCRIPTION OF THE PROCEDURE:  The patient was taken to the operating  room.  After induction of adequate generalized endotracheal anesthesia,  she was rolled in the prone position on the Wilson frame.  All pressure  points were padded and the thoracolumbar region was prepped with  DuraPrep and then draped in usual sterile fashion.  10 mL of local  anesthesia was injected.  A dorsal midline incision was made and carried  down to the thoracolumbar fascia.  The fascia was opened and the  paraspinous musculature was taken down in a subperiosteal fashion to  expose T10-L2.  Intraoperative x-ray confirmed my level and then used  the Leksell rongeur to remove the spinous processes and then used a  combination of high-speed drill and Kerrison punches to perform  laminectomies from T12-L2.  The underlying yellow ligament was removed.  The lateral recesses were decompressed.  We then dried all bleeding  points.  We then opened the dura with a 15 blade scalpel in the entire  length of the laminectomy defect and held this open with 4-0 Nurolon  sutures.  We then found very thickened arachnoid adherent to the top of  the spinal cord.  We were able to open this with a nerve hook in the  entire length of the cord down pass the conus into the caudal equina.  The spinal cord was very taut and tented up over this ventral arachnoid  cyst.  We were able to dissect to the right side of the spinal cord,  continue up march along the lateral edge of the cord until we found  ventrally the thin-walled arachnoid cyst.  We were able to open this  with a nerve hook and had egress of significant amounts of spinal fluid  under pressure and the spinal cord relaxed.  We were then able to  fenestrate the cyst superior to the L2 nerve root up to the L1 nerve  root.  We then went superior to the L1 nerve root and fenestrated the  cyst up to the T12 nerve root and then was superior to the T12 nerve  root and found no significant cyst superior to the T12 nerve root.  We  then went inferior to the L2 nerve root and found no significant cyst  there.  Therefore, we felt like we had an excellent fenestration of the  cyst.  We were able to to fenestrate both the ventral and dorsal aspects  of the cyst and the spinal cord relaxed and sat nicely in its normal  position.  We then irrigated with saline solution and inspected once  again, got one last x-ray to assure our level at the pedicle of L2, and  then closed the dura with a running 4-0 Nurolon suture.  We then  performed a Valsalva procedure to make sure we had what was felt to be a  watertight closure.  We then lined the closure with Tisseel fibrin glue,  Duragen,  and finally Gelfoam.  We then dried all bleeding points,  irrigated with saline solution containing bacitracin, and then closed  the muscle and fascia with 0 Vicryl closing the subcutaneous and  subcuticular tissue with 2-0 and 3-0 Vicryl, and closed the skin with  Benzoin and Steri-Strips.  The drapes were removed.  A sterile dressing  was applied.  The patient was awakened from general anesthesia and  transferred to recovery room in stable condition.  At the end of  procedure, all sponge, needle, and instrument counts were correct.      Tia Alert, MD  Electronically Signed     DSJ/MEDQ  D:  04/13/2008  T:  04/14/2008  Job:  [id number]

## 2011-04-30 NOTE — Op Note (Signed)
[name], [name]                 ACCOUNT NO.:  [id number]   MEDICAL RECORD NO.:  [id number]          PATIENT TYPE:  INP   LOCATION:  A316                          FACILITY:  [facility]   PHYSICIAN:  Lionel December, M.D.    DATE OF BIRTH:  [birth year]   DATE OF PROCEDURE:  04/17/2007  DATE OF DISCHARGE:                               OPERATIVE REPORT   PROCEDURE:  Esophagogastroduodenoscopy.   INDICATIONS FOR PROCEDURE:  [name] is a 75 year old Caucasian female  who was admitted to this facility from the [facility] for chest pain,  and she ruled out for myocardial infarct and PE.  Chest CT reveals  thickening to her distal esophagus.  There is also history of melena.  She also has dysphagia.  The procedure risks were reviewed with the  patient, and informed consent was obtained.   MEDICATIONS FOR CONSCIOUS SEDATION:  Benzocaine spray for pharyngeal  topical anesthesia, Demerol 25 mg IV, Versed 2 mg IV.   FINDINGS:  The procedure was performed in the endoscopy suite.  The  patient's vital signs and O2 saturations were monitored during procedure  and remained stable.  The patient was placed in the left lateral  recumbent position, and the Pentax videoscope was passed via oropharynx  without any difficulty into the esophagus.  A cheesy exudate was noted  involving the buccal mucosa as well as tongue and pharyngeal wall.   Esophagus.  There was a patchy cheesy exudate to esophageal mucosa,  predominately in the distal one-third.  She had patchy ulceration  involving the distal 7 cm.  One ulcer extended down to GE junction, but  there was no stricture.  The GE junction was at 35 cm from the incisors.   Stomach.  It was empty and distended very well with insufflation.  The  folds of the proximal stomach were normal.  Examination of the mucosa at  body, antrum, pyloric channel as well as angularis, fundus and cardia  was normal.   Duodenum.  The bulbar mucosa was normal.  The scope was  passed into the  second part of the duodenum where mucosa and folds were normal.  The  endoscope was withdrawn.  The patient tolerated the procedure well.   FINAL DIAGNOSES:  1. [name] has a combination of ulcerative and Candida esophagitis.      In addition, she has oropharyngeal candidiasis.  2. No evidence of obvious stricture in her esophagus.  Therefore, her      esophagus was not dilated.  3. Brushing obtained for potassium hydroxide prep from her esophagus.   RECOMMENDATIONS:  1. Continue anti-reflux measures.  2. Continue PPI on b.i.d. schedule.  3. Mycostatin suspension 5000 units swish and swallow q.i.d. for 10      days.      Lionel December, M.D.  Electronically Signed     NR/MEDQ  D:  04/17/2007  T:  04/17/2007  Job:  [id number]   cc:   [facility]

## 2011-04-30 NOTE — Discharge Summary (Signed)
NAME:  [name], [name]                 ACCOUNT NO.:  [id number]   MEDICAL RECORD NO.:  [id number]          PATIENT TYPE:  INP   LOCATION:  A316                          FACILITY:  [facility]   PHYSICIAN:  Marcello Moores, MD   DATE OF BIRTH:  1936-07-01   DATE OF ADMISSION:  04/15/2007  DATE OF DISCHARGE:  05/05/2008LH                               DISCHARGE SUMMARY   ADDENDUM:  Ms.  Gaber is a nursing home resident who was admitted for chest pain  and diagnosed to have esophagitis candidiasis and reflux ulcers as  detailed on the discharge and the plan was to discharge her today. The  patient is very stable, but her heart rate remains slightly  tachycardiac.  Her heart rate is around 90-110 for unknown known cause.  I discussed with the patient and her son we will watch her overnight and  if we are able to detect the cause, but the bed in the nursing home is  supposed to be for today, if not, it will be given to another person and  they preferred to go to the nursing home today and have follow-up from  there and I agreed with their concern. She is febrile, but she has mild  anemia with hemoglobin 12 and hematocrit 32.5. White blood cells was 8.5  and thyroid stimulating hormone is in process and it will be followed by  them and she will have cardiology follow-up with her cardiologist in  [place] this week and for her left knee swelling and also she will be  seeing Dr. Margit Banda did surgery and she might need also  GI workup for her mild anemia and this was communicated to her son at  the bedside who is very understanding and he promised me that he will  call Dr. Blair Promise he will communicate with her cardiologist and with  the nursing home physician as well.      Marcello Moores, MD  Electronically Signed     MT/MEDQ  D:  04/20/2007  T:  04/21/2007  Job:  [id number]

## 2011-04-30 NOTE — Assessment & Plan Note (Signed)
[facility]                            CARDIOLOGY OFFICE NOTE   [name], [name]                        MRN:          [id number]  DATE:03/22/2009                            DOB:          May 23, 1936    PRIMARY CARE PHYSICIAN:  [name] Penna, MD   REASON FOR PRESENTATION:  The patient with chest pain and coronary  artery disease.   HISTORY OF PRESENT ILLNESS:  The patient returns for followup of the  above.  The patient reports an episode of chest discomfort, actually  happening this morning and last night.  She noticed it really when she  woke up, there was a squeezing in sensation in her mid chest.  It was  similar to symptoms she had in the past at which time she was cathed and  found to have coronary artery disease as described below.  I thought  that she might have esophageal spasm.  She did take a nitroglycerin and  this improved her symptoms.  They slowly went away with this.  She  described it as severe, burning, squeezing discomfort.  It did not  radiate to her jaw or to her arms.  She did have some associated  shortness of breath, mild nausea.  She might have been slightly  diaphoretic.  She is not particularly active.  However, with her minimal  activities of daily living, she does not typically bring on these  symptoms.  She has been limited by knee pain, fibromyalgia, and back  pain.  She does not describe resting shortness of breath and has not had  any PND or orthopnea.  Of note, she has not had any other palpitations  that she was having in the past, but she has had no presyncope or  syncope.  Since, I last saw her, she has had back surgery and had no  significant problems with this.   PAST MEDICAL HISTORY:  Coronary artery disease (catheterization in  October 01, 2004 with 20% left main stenosis, 40% LAD stenosis, 70%  second diagonal stenosis, 40% proximal circumflex stenosis, 40% lesion  in the first obtuse marginal, right  coronary artery 20% stenosis.  The  patient had patent stents in the ramus intermedia), gastroesophageal  reflux disease, esophageal stricture, asthmatic bronchitis,  fibromuscular dysplasia, osteoporosis, hyperlipidemia, right knee  cartilage repair, rotator cuff surgery, lumpectomy, back surgery for  removal of a cyst from her spine.   ALLERGIES AND INTOLERANCES:  NONSTEROIDAL, PENICILLIN, SULFA and  CAPOTEN.   MEDICATIONS:  1. Singulair 10 mg a day.  2. Xanax.  3. Aspirin 81 mg daily.  4. Synthroid 50 mcg daily.  5. Protonix 40 mg daily.  6. Omega 3.  7. Multivitamin.  8. Zoloft 100 mg daily.  9. Lovaza.  10.Crestor 20 mg daily.  11.Nasonex.  12.Symbicort.  13.Lopressor 50 mg b.i.d.  14.Trilipix 135 mg daily.  15.Zyprexa 5 mg daily.   REVIEW OF SYSTEMS:  As stated in the HPI and otherwise negative for all  other systems.   PHYSICAL EXAMINATION:  GENERAL:  The patient is in no distress.  VITAL SIGNS:  Blood pressure 108/84, heart rate 63 and regular, weight  146 pounds, body mass index 28.  HEENT:  Eyes are unremarkable, pupils equal, round, and reactive to  light; fundi not visualized, oral mucosa unremarkable.  NECK:  No jugular venous distension at 45 degrees, carotid upstroke  brisk and symmetrical.  No bruits.  No thyromegaly.  LYMPHATICS:  No cervical, axillary, inguinal adenopathy.  LUNGS:  Clear to auscultation bilaterally.  BACK:  No costovertebral angle tenderness.  CHEST:  Unremarkable.  HEART:  PMI not displaced or sustained.  S1 and S2 are within normal  limits.  No S3, no S4.  No clicks, no rubs, no murmurs.  ABDOMEN:  Flat, positive bowel sounds, normal in frequency and pitch, no  bruits, no rebound, no guarding, no midline pulsatile mass.  No  hepatomegaly, no splenomegaly.  SKIN:  No rashes.  No nodules.  EXTREMITIES:  2+ pulses throughout, no edema, no cyanosis, no clubbing.  NEUROLOGIC:  Oriented to person, place, and time.  Cranial nerves II   through XII grossly intact.  Motor grossly intact.   EKG, sinus rhythm, rate 63, axis within normal limits, intervals within  normal limits.  No acute ST-T wave changes.   ASSESSMENT AND PLAN:  1. Chest discomfort.  The patient's chest discomfort has some      worrisome features consistent with unstable angina.  She has known      coronary disease as described.  She has had somewhat similar      symptoms in the past, and felt not to be cardiac, possibly      gastrointestinal.  It is very difficult to sort this out.  However,      given the above history and the fact that we had not done any      stress testing since 2007, I would suggest a stress test.  The      patient states she would not be able to ambulate.  Therefore, she      will have an adenosine Cardiolite.  Further evaluation will be      based on these results.  2. Atrial fibrillation.  The patient has not had any paroxysms of      this.  Therefore, no further cardiovascular testing is suggested.  3. Hypertension.  Blood pressure is controlled under current      medications.  She will continue these as listed.  4. Dyslipidemia.  I did review her labs done first in [place].  Her      LDL in December was 72 with an HDL of 49.  This is a reasonable      result.  She will continue the meds as listed.  5. Followup.  I will see the patient again in 1 year unless she has      further symptoms and abnormality on her stress perfusion study.     Rollene Rotunda, MD, [facility]  Electronically Signed    JH/MedQ  DD: 03/22/2009  DT: 03/23/2009  Job #: [id number]   cc:   [name], M.D.

## 2011-05-03 NOTE — Discharge Summary (Signed)
NAMESHANEAL, BARASCH NO.:  [id number]   MEDICAL RECORD NO.:  [id number]          PATIENT TYPE:  INP   LOCATION:  3018                         FACILITY:  [facility]   PHYSICIAN:  Tia Alert, MD     DATE OF BIRTH:  Feb 04, 1936   DATE OF ADMISSION:  04/13/2008  DATE OF DISCHARGE:  04/17/2008                               DISCHARGE SUMMARY   ADMITTING DIAGNOSIS:  Thoracolumbar arachnoid cyst with leg pain.   PROCEDURE:  Thoracolumbar laminectomy for fenestration of arachnoid  cyst.   BRIEF HISTORY OF PRESENT ILLNESS:  Ms. Wherry is a 75 year old female  who presented with severe leg pain.  She had an MRI and a CT scan which  showed a large thoracolumbar arachnoid cyst.  We recommended a  thoracolumbar laminectomy for a fenestration of cyst.  She understood  the risk,  benefits, and expected outcome and wished to proceed.   HOSPITAL COURSE:  The patient was admitted on May 13, 2008, and taken to  operating room where she underwent a thoracolumbar laminectomy for  fenestration of a intradural extramedullary arachnoid cyst.  The patient  tolerated procedure well and was taken to recovery room and then to the  floor in stable condition.  For details of the operative procedure,  please see the dictated operative note.  The patient remained at bedrest  for a couple of days because of her dural opening.  She was then allowed  out of bed where she ambulated.  Her wound remained clean, dry, and  intact.  She had good strength in her lower extremities.  She complained  of significant incisional soreness.  Her leg pain was somewhat improved.  She continued to progress nicely and was discharged to home on Apr 17, 2008.  She has stable vital signs.  Her wound was clean, dry, and  intact.  She had no positional headaches.   PLAN:  Followup in 2 weeks in the office.   FINAL DIAGNOSIS:  Thoracolumbar laminectomy for arachnoid cyst.      Tia Alert, MD  Electronically  Signed     DSJ/MEDQ  D:  05/13/2008  T:  05/14/2008  Job:  [id number]

## 2011-05-03 NOTE — H&P (Signed)
[place]  Patient:    [name], [name] Visit Number: [id number] MRN: [id number]          Service Type: MED Location: 2000 2013 01 Attending Physician:  Mirian Mo Dictated by:   Jesse Sans Wall, M.D. [facility] Admit Date:  04/01/2002   CC:         Rollene Rotunda, M.D. [facility]  Monica Becton, M.D., [facility], [place], [place]   History and Physical  CHIEF COMPLAINT: "Aching in my chest, up in the arm, and up into my neck and back."  HISTORY OF PRESENT ILLNESS: [name] is a very pleasant 75 year old white female, with a history of coronary artery disease, status post recent high-speed rotational ablation and stenting of the right coronary artery on March 24, 2002.  She had a periprocedure myocardial infarction.  Today while shopping she had increased shortness of breath and diaphoresis accompanied with these symptoms of hurting in her left arm and burning pain in her neck. It lasted until she came to the emergency room.  Her pre-PCI symptoms included weakness and nausea.  However, when she was in the laboratory she had some chest pressure.  Her initial evaluation in the emergency room showed her to be stable, in no acute distress.  Her EKG showed normal sinus rhythm with nonspecific ST segment changes inferolaterally.  There were no acute changes.  PAST MEDICAL HISTORY:  1. Hypertension.  2. Hyperlipidemia.  3. Family history of coronary disease and stroke.  4. Remote tobacco history.  5. History of fibromyalgia.  6. History of palpitations, with PACs and PVCs.  7. History of gastroesophageal reflux as well.  PAST SURGICAL HISTORY:  1. Partial hysterectomy.  2. Right knee surgery.  3. Lumpectomy.  SOCIAL HISTORY: She lives alone.  She has two sons.  She quit smoking in 1990.  CURRENT MEDICATIONS:  1. Aspirin 81 mg q.d.  2. Plavix 75 mg q.d.  3. Toprol-XL 100 mg q.d.  4. Hydrochlorothiazide 25 mg q.d.  5. Singulair, unknown dose.  6. Paxil 25 mg q.d.  7. Protonix 40 mg q.d.  8. Pravachol 20 mg q.d.  9. Tricor 160 mg q.d. 10. Pulmicort q.d. 11. Beconase q.d. 12. Estratab 0.65 mg q.d. 13. Foradil b.i.d. 14. She is being weaned off her estrogen per Dr. Antoine Poche.  FAMILY HISTORY: Remarkable for heart disease and stroke.  REVIEW OF SYSTEMS: Unremarkable other than the above HPI.  PHYSICAL EXAMINATION:  VITAL SIGNS: Blood pressure 115/6, pulse 71 and regular, respiratory rate 20 and unlabored.  TEMP 97.5 degrees.  Saturation 93%.  GENERAL: Well-developed, well-nourished white female in no acute distress. She is very pleasant.  HEENT: Normocephalic, atraumatic.  PERRLA.  EOMI.  Sclerae clear. Oropharyngeal examination without erythema or exudate.  NECK: Supple.  No JVD.  No carotid bruits.  No thyromegaly.  No lymphadenopathy.  CARDIAC: Regular rate and rhythm with soft systolic ejection murmur at the apex.  LUNGS: Clear to auscultation and percussion.  SKIN: No rashes, warm and dry.  ABDOMEN: Soft, good bowel sounds.  Positive right femoral bruit with small hematoma.  EXTREMITIES: No clubbing, cyanosis, or edema.  NEUROLOGIC: Grossly intact.  LABORATORY DATA: EKG shows normal sinus rhythm, rate 68, inferior T wave changes; no acute changes.  Negative CPK and MB and troponin.  Her potassium is 3.3.  Hemoglobin 11.9. Coags are normal.  ASSESSMENT/PLAN:  1. Chest discomfort, worrisome for recurrent ischemia.  The patient has had     recent  percutaneous coronary intervention of the right coronary artery,     complicated by periprocedure infarct.  She needs relook catheterization.  2. Hypokalemia.  Will supplement tonight.  3. Right femoral bruit with small hematoma.  Ultrasound of groin on Monday.  4. Urinary tract infection, on Cipro.  We will continue this antibiotic.Dictated by:   Jesse Sans Wall, M.D. [facility] Attending Physician:  Mirian Mo DD:   04/01/02 TD:  04/02/02 Job: [id number]

## 2011-05-03 NOTE — H&P (Signed)
NAME:  [name], [name]                  ACCOUNT NO.:  [id number]   MEDICAL RECORD NO.:  [id number]         PATIENT TYPE:  LINP   LOCATION:                               [facility]ACILITY[facility]   PHYSICIAN:  Erasmo Leventhal, M.D.DATE OF BIRTH:  1936/11/03   DATE OF ADMISSION:  03/27/2007  DATE OF DISCHARGE:                              HISTORY & PHYSICAL   DATE OF SURGERY:  March 27, 2007.   CHIEF COMPLAINTS:  Right knee osteoarthritis.   HISTORY OF PRESENT ILLNESS:  This is a 75 year old lady with a history  of end-stage osteoarthritis of her right knee which has failed  conservative treatment to alleviate her pain and discomfort.  After  discussion of treatment options, risks and benefits, the patient is now  scheduled for total knee arthroplasty of her right knee.  She has seen  Dr. Antoine Poche and Dr. Christell Constant, her medical doctors, and will now proceed  with surgery.  We will have Dr. Antoine Poche and his group and the  hospitalist follow her postop to take care of her multiple medical  problems.  Again, the risks and benefits of the surgery were discussed  with the patient, questions invited and answered.   PAST MEDICAL HISTORY:  DRUG ALLERGY TO PENICILLIN WITH SWELLING AND  REDNESS.  SULFA WITH REDNESS AND WELTS.   CURRENT MEDICATIONS:  1. Nitroglycerin 0.4 mg p.r.n.  2. Nitrofurantoin 100 mg 1 p.o. daily.  3. Zetia 10 mg daily.  4. Tricor 145 mg 1 daily.  5. Crestor 20 mg 1 daily.  6. Singulair 10 mg 1 daily.  7. Alprazolam 0.5 mg 2 nightly.  8. Zoloft 100 mg 1 p.o. daily.  9. K-Dur 10 mEq 1 daily.  10.Toprol 100 mg 1 daily.  11.Protonix 40 mg 1 b.i.d.  12.Isosorbide mono 1 daily.  13.Aspirin 81 mg daily.  14.Levothyroxine 50 mcg 1 daily.  15.Hyoscyamine 0.125 mg p.r.n.  16.Promethazine 25 mg p.r.n.  17.Forteo 750 mcg per 3 mL 1 shot daily.  18.Symbicort 80/4.5 two sprays b.i.d.  19.Nasacort 55 mcg 2 sprays daily.   SERIOUS MEDICAL ILLNESS INCLUDE:  Coronary artery disease,  asthma,  hypothyroidism, reflux and hyperlipidemia, and history of MI with stent.   PREVIOUS SURGERIES INCLUDE:  Hysterectomy, open reduction/internal  fixation of ankle fracture, lumpectomy of left breast and coronary  stents.   FAMILY HISTORY:  Positive for coronary artery disease, hypertension,  cancer, CVA and osteoarthritis.   SOCIAL HISTORY:  The patient is widowed.  She is retired.  She lives at  home.  She does not smoke and does not drink.   REVIEW OF SYSTEMS:  CENTRAL NERVOUS SYSTEM:  Positive for anxiety.  PULMONARY:  Positive for coronary artery disease, MI and stent.  CARDIOVASCULAR:  Positive for history of coronary artery disease with  stent MI.  Negative for current angina.  GI:  Positive for GERD.  GU:  Positive for chronic UTI.  MUSCULOSKELETAL:  Positives in the HPI.   PHYSICAL EXAMINATION:  VITAL SIGNS:  BP 110/80, respirations 16, pulse  70 and regular.  GENERAL APPEARANCE:  This is a  well-developed, well-nourished lady in no  acute distress.  HEENT:  Head normocephalic.  Nose patent.  Ears patent.  Pupils equal, round and react to light.  Throat without injection.  NECK:  Supple without adenopathy.  Carotids 2+ without bruit.  CHEST:  Clear to auscultation.  No rales or rhonchi.  Respirations 16.  HEART:  Regular rate and rhythm at 70 beats per minute without murmur.  ABDOMEN:  Soft with active bowel sounds.  No masses, organomegaly.  NEUROLOGIC:  Patient alert and oriented to time, place and person.  Cranial nerves II-XII grossly intact.  EXTREMITIES:  Shows the right knee with 0-25 degrees range of motion,  crepitation throughout the range of motion.  Dorsalis pedis and  posterior tibialis pulses are 2+.  SKIN:  Within normal limits.   X-rays show end-stage osteoarthritis of the right knee.   IMPRESSION:  End-stage osteoarthritis of the right knee.   PLAN:  Total knee arthroplasty of the right knee.  Note that the patient  does live at home alone.  She  will probably require a rehab stay  postoperatively until she is able to go home.  And, again, we will get  the hospitalist and Dr. Jenene Slicker group to follow her postoperatively.      Jaquelyn Bitter. Chabon, P.A.    ______________________________  Erasmo Leventhal, M.D.    SJC/MEDQ  D:  03/10/2007  T:  03/10/2007  Job:  [id number]

## 2011-05-03 NOTE — Procedures (Signed)
[place]  Patient:    [name], [name] Visit Number: [id number] MRN: [id number]          Service Type: CAT Location: 6500 [id number] Attending Physician:  Rollene Rotunda Dictated by:   Daisey Must, M.D. [facility] Proc. Date: 03/24/02 Admit Date:  03/24/2002   CC:         [name], M.D.  Rollene Rotunda, M.D. [facility]  Cardiac Catheterization Lab   Procedure Report  PROCEDURE PERFORMED:  High-speed rotational atherectomy followed by percutaneous transluminal coronary angioplasty and stent placement, ramus intermedius branch.  INDICATIONS:  [name] is a 75 year old woman who has had progressive chest pain including a prolonged episode of chest pain occurring at rest recently. Cardiac catheterization performed today by Dr. Antoine Poche revealed what appeared to be by angiography a long 80% stenosis in a large ramus intermedius branch. The lesion is fairly calcified.  After review of the images, we opted to proceed with percutaneous intervention.  PROCEDURAL NOTE:  A preexisting #6 French sheath in the right femoral artery was exchanged over wire for a #7 [place] sheath.  Heparin and Integrilin were administered per protocol.  We initially used a Voda left #3 guiding catheter. We advanced a BMW wire under fluoroscopic guidance beyond the lesion into the distal vessel.  We then attempted to pass a 2.25 x 15-mm Quantum balloon over the wire; however, this would not cross the lesion due to the severity of the stenosis and the presence of calcification.  We then attempted to pass a 1.5 x 15-mm Maverick balloon over the wire and this also failed to cross.  At that point based on the inability to cross with the smallest balloon available, we opted to proceed with high-speed rotational atherectomy.  We advanced a Roto Floppy wire slightly beyond the lesion into the distal vessel; however, it was very difficult to torque this wire and get it much  beyond the lesion.  We could not get it far enough to perform rotational atherectomy.  We attempted to advance the wire further with backup support of a transit catheter; however, this was also unsuccessful.  Following this, we advanced a luge wire across the lesion and this was able to cross the lesion.  We advanced it to the very distal vessel.  We again attempted to pass the transit catheter over the luge wire to exchange through the lumen for the Roto Floppy wire; however, the transit catheter would not cross the lesion.  Likewise, we reattempted to pass a 1.5 x 15-mm Maverick balloon but this would not cross the lesion. We then advanced a mailman wire attempting to pass it alongside the luge wire; however, with the one wire down, the second wire would not cross the lesion due to severity of the stenosis.  This indicates that this was indeed a high-grade stenosis of likely 99% in severity.  We had to pull back the luge wire and then were able to advance the mailman wire beyond the lesion.  With support of the transit catheter, we advanced the mailman wire distally; however, we were unable to pass either the transit catheter or the Maverick balloon over this mailman wire across the lesion in order to exchange for the Roto Floppy wire.  We then attempted to cross the lesion with a 1.5 x 20-mm ACE fixed wire balloon; however, this also would not cross.  Finally our last attempt was to readvance the Roto Floppy wire utilizing the 1.5 x 15-mm Maverick  balloon as a backup support.  With this measure, we were actually able to advance the Roto Floppy wire into the distal vessel far enough in order to perform rotational atherectomy.  Prior to proceeding with rotational atherectomy, we had placed a #6 French sheath in the right femoral vein.  We then advanced a 1.25-mm bur over the wire.  We initially performed high-speed rotational atherectomy at an rpm ranging from 162 to 169,000 rpm.  We did  a total of 5 runs; however, again were unable to completely cross the lesion with this small Rotablator bur.  We therefore increased the rpms to over 170,000 rpm.  With this measure, we were able to cross the lesion successfully.  We then did a total of 2 runs across the full length of the lesion with the 1.25 bur at 172,000 rpm and 170,000 rpm, respectively. Following this, we advanced a 1.5-mm bur over the wire and performed 2 rotational atherectomy runs at 167,000 rpm and 169,000 rpm, respectively. This did result in significant improvement in the vessel lumen; however, there still was residual disease.  We readvanced a luge wire alongside the Roto Floppy wire into the distal vessel.  We then advanced a 2.25 x 15-mm Quantum balloon over the luge wire which crossed the lesion with ease at this time. We performed an inflation to 12 atmospheres more distally and then pulled the balloon back more proximally and inflated it again to 12 atmospheres.  There was improvement in vessel lumen; however, there was a residual 50% stenosis in the more proximal part of the lesion.  We therefore deployed a 2.25 x 16-mm Express 2 stent at a deployment pressure of 11 atmospheres.  The stent was post dilated with a 2.25 x 15-mm Quantum balloon inflated to 16 atmospheres. Final angiographic images were obtained revealing patency of the ramus intermedius with 0% residual stenosis and TIMI-3 flow.  COMPLICATIONS:  None.  RESULTS:  Difficult but successful high-speed rotational atherectomy followed by percutaneous transluminal coronary angioplasty and stent placement in the ramus intermedius.  A 99% stenosis with calcification was reduced to 0% residual with TIMI-3 flow.  PLAN:  Integrilin will be continued for 18 hours.  Because the patient had a prolonged episode of chest pain during the rotational atherectomy, we will  cycle cardiac enzymes to rule out any myocardial damage; however, at the conclusion  of the case, the patient was chest pain-free.  Plavix will be administered for a minimum of 4 weeks. Dictated by:   Daisey Must, M.D. [facility] Attending Physician:  Rollene Rotunda DD:  03/24/02 TD:  03/25/02 Job: [id number]
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
## 2011-05-03 NOTE — Discharge Summary (Signed)
[facility]  Patient:    [name], [name] Visit Number: [id number] MRN: [id number]          Service Type: CAT Location: 6500 [id number] Attending Physician:  Rollene Rotunda Dictated by:   Pennelope Bracken, R.N. Admit Date:  03/24/2002 Disc. Date: 03/26/02   CC:         Monica Becton, M.D.   Discharge Summary  DATE OF BIRTH: 1936-07-30  REASON FOR ADMISSION: Elective cardiac catheterization for exertional dyspnea and diaphoresis.  DISCHARGE DIAGNOSES:  1. Coronary artery disease, status post percutaneous coronary intervention of     the ramus intermedius, reducing stenosis there from 99% to 0% with TIMI     (thrombolysis in myocardial infarction) flow.  2. Exertional dyspnea and diaphoresis, diaphoresis persistent post     catheterization.  3. Long-standing hypertension.  4. History of abdominal bruits.  Distal aortogram revealing no high-grade     renal artery lesions, with 25% left renal artery stenosis.  5. Hyperlipoproteinemia.  6. Family history of coronary artery disease.  7. Long-term heavy tobacco use, quit 13 years ago; probable chronic     obstructive pulmonary disease.  HISTORY OF PRESENT ILLNESS: This delightful 75 year old white female, with medical history outlined above, was evaluated by elective catheterization for history of exertional diaphoresis and weakness that interrupts her daily activity.  She had some chest pain three to four days prior to admission and she had some chest pain two weeks prior to admission, which she described as a steady ache for one hour at rest.  An event monitor given to her a year ago showed some PACs and PVCs without other arrhythmias.  HOSPITAL COURSE: The patient was admitted in stable condition and taken for angiography by Dr. Antoine Poche.  Findings were as follows:  Normal left main, calcified LAD with proximal luminal irregularities.  The D1 had a small subtotal ostial lesion and the  D2 had a median ostial lesion estimated to be 30%.  The circumflex showed mild disease in the AV groove estimated to be 25% proximally and 50% mid vessel.  The ramus intermedius was large and had a proximal long 80% lesion.  The RCA was a large dominant vessel with luminal irregularities.  Ejection fraction was 65% without wall motion abnormality. The left renal artery had luminal irregularities.  It was elected to perform a PCI of the RI, and this was accomplished - reducing stenosis there from 99% to 0% with TIMI-3 flow.  The patient was continued on Integrelin for 18 hours and recovered uneventfully.  She did sustain a rise in CK-MB post procedure, so it was elected to keep her an additional day.  On the day of discharge Dr. Antoine Poche judged her to be appropriate for discharge.  DISCHARGE PHYSICAL EXAMINATION:  GENERAL: The patient offered no complaints of chest pain.  She did admit to some mild nausea.  The patient was in no distress.  VITAL SIGNS: BP 110/64, pulse 68, respirations 20.  Afebrile.  LUNGS: Clear to auscultation bilaterally.  CV: Regular rate and rhythm.  ABDOMEN: Bowel sounds present.  Soft, nontender.  EXTREMITIES: No clubbing, cyanosis, or edema.  There is a small ecchymotic area at the groin site, without hematoma.  LABORATORY DATA: CBC at discharge revealed WBC 9.8, hemoglobin 11.6, hematocrit 33.6.  It should be noted that this represents an improvement in hemoglobin and hematocrit over admission hemogram.  CK-MB #1 CK 364, MB 42.7; #2 CK 414, MB 41.5; #3 CK  364, MB 42.7.  Final CK was 177 and MB 15.5. Chemistries - sodium 136, potassium 3.6, BUN 9, creatinine 1.0.  A 12 lead EKG on admission showed sinus brady with a rate of 57, T wave inversion in lead III only.  This represented no significant change since last tracing.  Chest x-ray revealed cardiomegaly with mild peribronchial thickening.  DISPOSITION: The patient was discharged to home.  DISCHARGE  MEDICATIONS:  1. Enteric-coated aspirin 81 mg one q.d.  2. Plavix 75 mg one q.d. x35month.  3. Toprol-XL 100 mg one q.d.  4. Hydrochlorothiazide 25 mg one q.d.  5. Singulair 10 mg one q.d.  6. Paxil 25 mg one q.d.  7. Protonix 40 mg one q.d.  8. Pravachol 20 mg one q.d.  9. Tricor 160 mg one q.d. 10. Pulmicort one puff q.d. 11. Foradil b.i.d. 12. Beconase one to two puffs q.d. 13. Estratab 0.65 mg.  The patient is instructed to wean off of this by     taking it every other day x1week, then every third day x1week, then once a     week for one week, then stop.  DISCHARGE ACTIVITY: No strenuous activity, heavy lifting, sexual activity for two days.  No driving for one week.  DISCHARGE DIET: Recommended low-fat/low-cholesterol diet.  DISCHARGE INSTRUCTIONS:  1. The patient is instructed to call the office if her groin wound becomes     hard or painful.  2. Cardiac rehab at [facility] will be contacted.  3. The patient knows to advance her physical activities to one-half hour q.d.  FOLLOW-UP: Follow-up will be in [place], [place] March 29, 2002 at 2:45 p.m. with Dr. Antoine Poche.  The patient is to call in the interim with any problems, questions, concerns, or change or increase in symptoms.Dictated by: Pennelope Bracken, R.N. Attending Physician:  Rollene Rotunda DD:  03/26/02 TD:  03/26/02 Job: [id number]

## 2011-05-03 NOTE — Op Note (Signed)
NAMEDRUCELLA, KARBOWSKI                 ACCOUNT NO.:  [id number]   MEDICAL RECORD NO.:  [id number]          PATIENT TYPE:  INP   LOCATION:  0001                         FACILITY:  [facility]   PHYSICIAN:  Erasmo Leventhal, M.D.DATE OF BIRTH:  [birth year]   DATE OF PROCEDURE:  03/27/2007  DATE OF DISCHARGE:                               OPERATIVE REPORT   PREOPERATIVE DIAGNOSIS:  Right knee end-stage osteoarthritis.   POSTOPERATIVE DIAGNOSIS:  Right knee severe end-stage osteoarthritis.   PROCEDURE:  Right total knee arthroplasty.   SURGEON:  Valma Cava, MD   ASSISTANT:  Jodene Nam, PA-C   ANESTHESIA:  General with femoral nerve block.   ESTIMATED BLOOD LOSS:  Less than 50 mL.   DRAINS:  Two mini Hemovac.   COMPLICATIONS:  None.   TOURNIQUET TIME:  1 hour and 20 minutes at 300 mmHg.   DISPOSITION:  To PACU stable.   OPERATIVE DETAILS:  The patient was counseled in the holding area, and  correct site was identified and chart signed appropriately.  Taken to  the OR.  Spinal anesthetic was attempted but unsuccessful.  General  anesthetic was proceeded.  IV antibiotics were given.  Foley catheter  was placed utilizing sterile technique by the OR circulating nurse.  All  extremities were well-padded and bumped.  The right knee was examined.  She was in valgus, 2 degrees of recurvatum flexed to 135.  She was  elevated, prepped with DuraPrep, and draped in a sterile fashion,  exsanguinated with an Esmarch.  Tourniquet was inflated to 300 mmHg.  Straight midline incision made through the skin and subcutaneous tissue.  Medial and lateral soft tissue flaps were developed.  Medial  parapatellar arthrotomy was performed.  Knee was flexed.  End-stage  arthritis changes.  Bone-against-bone, somewhat of a hypoplastic lateral  femoral condyle.  Cruciate ligaments were resected.  Proximal and medial  tibia soft tissue release was done secondary to varus malalignment.  Starter hole  made in distal femur.  Canal was irrigated.  Intramedullary  rod was gently placed.  We chose a 5-degree valgus cut and took a 9 mm  cut off the distal femur due to her recurvatum.  This gave a nice cut  medial and lateral.  The distal femur was found to be a size #2.5.  Rotational marks were set, and it was cut to fit a size 2.5.  Osteophytes were removed.  Medial and lateral menisci removed.  Geniculate vessels were coagulated.  Posterior neurovascular structures  were brought up and protected throughout the entire case.  The tibial  eminence was resected.  The proximal tibia was found to be a size 2.  Reamer, step-reamer, then irrigated, and the intramedullary rod was  gently placed.  It took a 10 mm cut based upon the medial side which was  least efficient with a 0-degree slope.  On flexion and extension blocks,  we were well balanced.  Posteromedial, posterior osteophytes removed  under direct visualization.  Tibial baseplate was applied.  Rotation  coverage was set.  Reamer and punch was performed.  Femoral box cut  was  performed.  At this time with a size 2 tibia, 2.5 femur with a 12.5  insert, we had excellent range of motion and soft tissue balance.  The  patella was found to be a size 32.  Appropriate amount of bone was  resected.  Locking holes were made with patella button in place.  We had  anatomic patellofemoral tracking.  Excellent clinical alignment.  Mechanical and anatomic axis and well balanced.  The knee was then  flexed, and all trials were removed.  Knee was irrigated with pulsatile  lavage.  Utilized a Web designer.  All components were  cemented into place, size 2 tibia, size 2.5 femur with a 32 patella.  Cement had cured.  Excess cement was removed and trials of 12.5 and 15  with a 15 mm insert.  We were well balanced flexion and extension, varus  and valgus stress, patellofemoral tracking was anatomic, and alignment  was excellent.  Trial was removed.   Knee was again irrigated, and the  final 15 mm posterior stabilized tibial insert was then implanted.  The  system was a Hospital doctor.  Posterior stabilized  implants.  All cemented.  Tibia 2.5, femur 15 tibial insert, and a 32  patella.  Two mini Hemovac drains were placed,  Sequential closure of  layers was done.  Arthrotomy with Vicryl, subcu Vicryl, skin  subcuticular Monocryl suture.  Steri-Strips were applied, drain hooked  to suction.  Sterile dressing applied,  tourniquet was deflated.  Normal  circulation in the foot and ankle at the end of the case.  We will check  her common peroneal nerve function when she awakens in the recovery room  and can follow commands.  Sponge and needle count correct, no  complications or problems.   At this time, the femoral nerve block was administered per Dr. Council Mechanic.   To help with surgical technique and decision making, Mr. Brett Canales Chabon's  assistance was needed throughout the entire case.           ______________________________  Erasmo Leventhal, M.D.     RAC/MEDQ  D:  03/27/2007  T:  03/27/2007  Job:  [id number]

## 2011-05-03 NOTE — Discharge Summary (Signed)
[place]. [facility]  Patient:    [name], [name] Visit Number: [id number] MRN: [id number]          Service Type: MED Location: 2000 2013 01 Attending Physician:  [name] Dictated by:   [name], P.A. Admit Date:  04/01/2002 Disc. Date: 04/06/02   CC:         [name], M.D. at [facility]   Referring Physician Discharge [place]  DATE OF BIRTH:  [birth year]  PROCEDURES: 1. Cardiac catheterization. 2. Coronary arteriogram. 3. Left ventriculogram.  HOSPITAL COURSE:  [name] is a 75 year old female with known coronary artery disease who is status post catheterization on March 24, 2002 at which point she had HSRA and stent to a ramus intermedius, reducing that stenosis from 99% to 0 with TIMI 3 flow.  She came to the hospital on April 01, 2002 for shortness of breath with exertion and diaphoresis.  She had an activity level today that was increased from what she had had since she was discharged from the hospital and began getting short of breath as well as extreme diaphoresis; the patient states she was soaked in sweat.  Her symptoms started about 4 p.m.  Shortly after those symptoms started she had onset of left arm ache and burning pain that radiated up into her neck.  This lasted until she was in the emergency room.  It was not like her pre-PCI symptoms but she was admitted to rule out MI and for further evaluation.  Her enzymes were negative for MI and it was felt that a cardiac catheterization was needed to further evaluate her pain.  She had a cardiac catheterization on April 05, 2002 and it showed a normal left main and an LAD that had a 20% stenosis in the mid portion.  The second diagonal had a 70% stenosis and the first diagonal was small vessel that was subtotaled.  The ramus intermedius had a patent stent with approximately 50% stenosis.  The circumflex had two 30% lesions.  The left  ventriculogram was not performed.  It was felt that medical therapy was indicated and she had Imdur added to her medication regimen.  The next day she had a groin ultrasound because of right groin pain and it showed no evidence of pseudoaneurysm or AV fistula.  The patient was ambulatory with good pain control.  She tolerated the Imdur well and was considered stable for discharge on April 06, 2002.  DISCHARGE CONDITION:  Stable.  DISCHARGE DIAGNOSES:  1. Unstable anginal pain, medical therapy recommended at this time.  2. History of cardiac catheterization on March 24, 2002 with high-speed     rotational atherectomy and stent to the ramus intermedius and a     periprocedural myocardial infarction.  3. History of hypertension.  4. Hyperlipidemia.  5. Family history of coronary artery disease.  6. Remote history of tobacco use, quit in 1990.  7. Fibromyalgia.  8. History of premature atrial contractions and premature ventricular     contractions.  9. History of gastroesophageal reflux disease. 10. Possible history of fibromuscular hyperplasia. 11. History of right knee surgery and lumpectomy. 12. History of allergy to NSAIDS, NOROXIN, PENICILLIN, SULFA, CAPOTEN.  DISCHARGE INSTRUCTIONS:  ACTIVITY:  Her activity level is to include no driving, sexual, or strenuous activity for two days.  WOUND CARE:  She is to call the office for bleeding, swelling, or drainage at the catheterization site.  DIET:  She is to  stick to a low fat diet.  FOLLOW-UP:  She is to follow up with Dr. Antoine [name] in [place] on Apr 28, 2002 at 2:45 p.m.  She is to follow up with Dr. Christell [name] as needed.  DISCHARGE MEDICATIONS:  1. Aspirin 81 mg q.d.  2. Plavix 75 mg q.d.  3. Toprol-XL 100 mg q.d.  4. HCTZ 25 mg q.d.  5. Singulair 10 mg q.d.  6. Paxil 25 mg q.d.  7. Protonix 40 mg q.d.  8. Tricor 160 mg q.d.  9. Nitroglycerin p.r.n. 10. Pulmicort q.d. 11. Beconase q.d. 12. Estradiol 0.65 q.d. 13. Foradil  b.i.d. 14. K-Dur 10 mEq q.d. 15. Pravachol 20 mg q.d. 16. Xanax 1 mg p.r.n. 17. Imdur 30 mg q.d. Dictated by:   [name], P.A. Attending Physician:  [name] DD:  04/06/02 TD:  04/06/02 Job: [id number]

## 2011-05-03 NOTE — Cardiovascular Report (Signed)
NAMEHAVANNAH, STREAT NO.:  [id number]   MEDICAL RECORD NO.:  [id number]          PATIENT TYPE:  INP   LOCATION:  4714                         FACILITY:  [facility]   PHYSICIAN:  Charlton Haws, M.D.     DATE OF BIRTH:  [birth year]   DATE OF PROCEDURE:  10/01/2004  DATE OF DISCHARGE:                              CARDIAC CATHETERIZATION   INDICATIONS FOR PROCEDURE:  Recurrent chest pain, history of Rotablator and  stenting of an intermediate branch.   Cardiac catheterization was done from the right femoral artery.   The left main coronary artery had a 20% stenosis.  The left anterior  descending  artery had 30% discrete lesion proximally, there is a 40% mid  vessel lesion.  The distal vessel was normal.  There was a very small first  diagonal branch.  The second diagonal branch had a 70% ostial lesion that  was stable since the last cath of April 2003.  The circumflex coronary  artery 40% tubular disease proximally.  There was a 40-50% lesion in the  first obtuse marginal branch.  The right coronary artery had 20% mid vessel  disease.  The intermediate branch was a somewhat small vessel and was widely  patent with a stent in the area that was rotablated.  Right ventriculography  showed hyperdynamic LV function.  EF was 70%.  There was no gradient across  the aortic valve and no MR.  Aortic pressure was 135/70.  LV pressure was  138/20.   IMPRESSION:  The patient's chest pain is atypical.  She had a normal  Cardiolite study in May.  The diagonal lesion is stable since April 2003.  I  do not think that this should be causing significant chest pain.  She will  be discharged on medical therapy in the morning.  She already has an  appointment with Dr. Marina Goodell.  She has a history of esophageal disease and,  most likely, will need a follow up EGD to further assess her symptoms.       PN/MEDQ  D:  10/01/2004  T:  10/01/2004  Job:  [id number]

## 2011-05-03 NOTE — H&P (Signed)
[name], [name] NO.:  [id number]   MEDICAL RECORD NO.:  [id number]          PATIENT TYPE:  OBV   LOCATION:                               FACILITY:  [facility]   PHYSICIAN:  Rollene Rotunda, M.D.   DATE OF BIRTH:  07/19/1936   DATE OF ADMISSION:  09/28/2004  DATE OF DISCHARGE:                                HISTORY & PHYSICAL   BRIEF HISTORY:  This is a very pleasant 75 year old female followed by Dr.  Antoine [name] in our group with a history of coronary artery disease.  She has a  history of having had a stent placed in the ramus intermedius.  She  underwent cardiac catheterization in April 2003 performed by Dr. Antoine [name].  At that time she was found to have essentially single-vessel coronary artery  disease with a long proximal 80% lesion.  Her stent was noted to be patent.  Ejection fraction was 65% at that time.  The circumflex had a mid 50%  lesion.   The patient was seen in May by Dr. Antoine [name] for further evaluation of chest  pain.  A Cardiolite was performed at that time that showed no ischemia, EF  74%.   The patient requested an office visit today for evaluation of burning chest  pain that she has had intermittently for 1 month.  She also has a history of  gastroesophageal reflux disease.  She had called Dr. Lamar [name] office and has  an appointment to [name] him in approximately 2 weeks.  He had increased her  Protonix to b.i.d. in the interim.  The patient states that she awakes  almost every morning with burning chest pain that radiates across her chest  associated with weakness, shortness of breath, diaphoresis, and some nausea.  It is worse with bending over.  She only has it early in the mornings.  It  lasts about 20 or 30 minutes.  She had taken nitroglycerin with questionable  relief.  Her EKG in the office today is unremarkable.  However, it is  difficult to say whether this is actually reflux or whether it might be  ischemia.  She was seen by myself and  Dr. Antoine [name].  Decision was made to  admit the patient for cardiac catheterization.   PAST MEDICAL HISTORY:  1.  As noted, the patient does have history of coronary artery disease with      a ramus intermedius stent.  Initial stent placement details not      available.  2.  History of hypertension.  3.  History of gastroesophageal reflux disease.  4.  History of Cardiolite May 01, 2004 showing no ischemia, EF 74%.  5.  History of asthmatic bronchitis.   ALLERGIES:  The patient is allergic to PENICILLIN, SULFA, NAPROSYN, CAPOTEN,  and REGULAR STRENGTH ASPIRIN.   CURRENT MEDICATIONS:  1.  Imdur 30 mg daily.  2.  Protonix 40 mg daily.  3.  Toprol-XL 100 mg daily.  4.  Zoloft 100 mg daily.  5.  Actonel 35 mg weekly.  6.  Tricor 160 mg daily.  7.  Singulair  10 mg daily.  8.  Floredil inhaler.  9.  Pulmicort inhaler.  10. Xanax 0.5 mg p.r.n.  11. Aspirin 81 mg b.i.d.  12. Calcium daily.  13. Pravachol 80 mg daily.  14. Zetia 10 mg daily.  15. Synthroid 50 mcg daily.  16. Nitroglycerin spray p.r.n.   SOCIAL HISTORY:  The patient lives alone in [place].  She is widowed.  She  has two sons.  She quit tobacco 15 years ago.  She does not use alcohol.  She is on disability.   FAMILY HISTORY:  Her mother died in her 49s from an MI.  Her father died  from trauma.  She had a brother who died from CVA at age 18, a brother who  died from an aneurysm, a sister who is living, and another sister who I  believe died from coronary disease.   PHYSICAL EXAMINATION:  GENERAL:  Reveals a pleasant 75 year old white female  in no acute distress.  VITAL SIGNS:  Blood pressure 172/94, pulse 65, weight 157 pounds.  HEENT:  Unremarkable.  NECK:  Reveals no bruits, no jugular venous distention.  HEART:  Reveals regular rate and rhythm without murmur.  LUNGS:  Clear but slightly decreased.  ABDOMEN:  Obese, soft, nontender.  EXTREMITIES:  Reveal no significant edema.  SKIN:  Warm and dry.    Laboratory data and EKG in the office today were unremarkable.  The EKG  seems to have been misplaced at this time.   IMPRESSION:  1.  Chest pain, rule out ischemia.  The plan is for cardiac catheterization      on Monday.  2.  History of coronary artery disease with previous ramus intermedius      stent, essentially single-vessel disease.  3.  History of Cardiolite May 01, 2004 showing no ischemia, ejection      fraction 74%.  4.  Hypertension.  5.  Gastroesophageal reflux disease with history of esophageal stricture.  6.  History of asthmatic bronchitis.  7.  History of hyperlipidemia.  8.  History of hypothyroidism.  9.  History of anxiety.   PLAN:  As noted, we will admit the patient from the office today for  catheterization on Monday.  We will increase her Imdur.  If she has any  further pain, we will consider heparin anticoagulation.       ________________________________________  [name], P.A. [facility]  ___________________________________________  Rollene Rotunda, M.D.    DR/MEDQ  D:  09/28/2004  T:  09/28/2004  Job:  [id number]   cc:   [name], M.D.  [address] [place]  [place] [address]  Fax: [phone]

## 2011-05-03 NOTE — Cardiovascular Report (Signed)
[place]. [facility]  Patient:    [name], [name] Visit Number: [id number] MRN: [id number]          Service Type: MED Location: 2000 2013 01 Attending Physician:  Mirian Mo Dictated by:   Lewayne Bunting, M.D. [facility] Proc. Date: 04/05/02 Admit Date:  04/01/2002   CC:         Rollene Rotunda, M.D. [facility]  Monica Becton, M.D.   Cardiac Catheterization  DATE OF BIRTH: [birth year]  REFERRING PHYSICIANS: Dr. Monica Becton and Dr. Angelina Sheriff.  PROCEDURES PERFORMED: Left heart catheterization.  DIAGNOSES: 1. Nonobstructive coronary artery disease. 2. Patent stent to the ramus intermedius.  INDICATIONS: The patient is a 75 year old female with multiple risk factors for coronary artery disease. The patient underwent a recent cardiac catheterization by Dr. Antoine Poche and was found to have long 80% stenosis in the ramus intermedius. This procedure was followed by rotablator therapy and stent placement to the ramus intermediate by Dr. Gerri Spore earlier this month. The patient now presents with recurrent left arm and shoulder pain, albeit somewhat atypical compared to her prior symptoms. She has ruled out for myocardial infarction. She has been referred for diagnostic catheterization to assess her coronary anatomy.  DESCRIPTION OF PROCEDURE: After informed consent was obtained, the patient was brought to the catheterization laboratory.  The right groin was sterilely prepped and draped.  Lidocaine 1% was injected. A 6 French arterial sheath was placed using the modified Seldinger technique. A 6 French arterial sheath was placed using modified Seldinger technique. The 6 [place] JL 3.5 and JR4 catheters were used to engage and left and right coronary ostia respectively. Selective coronary angiography was performed in various projections using manual injection of contrast. No ventriculography was performed during this procedure. At the termination  of the procedure, all catheters and sheath were removed and the patient was brought back to the holding area. No complications were encountered.  FINDINGS:  HEMODYNAMICS: Aortic pressure 134/69 mmHg.  SELECTIVE CORONARY ANGIOGRAPHY: 1. Left main coronary is a large caliber vessel with no evidence of    flow-limiting coronary artery disease. 2. Left anterior descending artery is a large caliber vessel with diffuse    irregularities in the proximal segment. The first diagonal branch is    very small and subtotally occluded. The second diagonal branch has a    proximal 70% ostial stenosis but this is also a small vessel. The remainder    of the LAD is free of flow-limiting coronary artery disease. 3. Ramus intermedius is a medium to large caliber vessel. There are diffuse    irregularities in the proximal segment of this vessel but the stent site is    widely patent. The side branch originating from the stented area shows a    ostial 50% stenosis. This is a very small vessel. 4. The circumflex coronary artery has a proximal 30% stenosis and the mid    vessel has a 40-50% stenosis. A large posterolateral branch has a 30-40%    stenosis. 5. Right coronary artery is a large caliber vessel terminating in a    posterior descending artery with no evidence of flow-limiting coronary    artery disease.  RECOMMENDATIONS: Angiographic images were related to Dr. Antoine Poche. The stent site in the ramus is widely patent. There is no significant epicardial angiographic disease short of a small diagonal branch and the side branch of the ramus intermedius. Further medical therapy is indicated. Imdur 30 mg p.o. q.d. has  been started. Anticipate the patient can be discharged later today. Dictated by:   Lewayne Bunting, M.D. [facility] Attending Physician:  Mirian Mo DD:  04/05/02 TD:  04/05/02 Job: [id number]

## 2011-05-03 NOTE — Assessment & Plan Note (Signed)
[facility]                              CARDIOLOGY OFFICE NOTE   [name], [name]                        MRN:          [id number]  DATE:09/08/2006                            DOB:          [birth year]    PRIMARY CARE PHYSICIAN:  Ernestina Penna, M.D.   REASON FOR PRESENTATION:  Evaluate patient with coronary artery disease and  chest pain.   HISTORY OF PRESENT ILLNESS:  The patient is a pleasant 75 year old white  female with history of coronary artery disease as described below.  Since I  last saw her, she has been developing some chest discomfort.  This seems to  be sporadic.  It is not brought on with activity, though she is limited  because of knee pain.  It happens at rest.  She describes some sharp chest  discomfort.  It may last for 5-10 minutes.  It goes away after she takes  nitroglycerin.  She has been taking increasing doses of nitroglycerin. She  says it feels kind of like pins or stabbing. S he does not describe  radiation to her neck or to her arms.  She does not describe any episodes of  palpitations, presyncope, or syncope.  She has had no PND or orthopnea.  Again, she is limited by joint pain.   PAST MEDICAL HISTORY:  1. Coronary artery disease (catheterization October 01, 2004, with 20%      left main stenosis, 40% LAD stenosis, 70% second diagonal stenosis, 40%      proximal circumflex stenosis, 40% lesion of first obtuse marginal,      right coronary artery 20% stenosed. She had patent stents in a ramus      intermediate).  2. Gastroesophageal reflux disease.  3. Esophageal stricture.  4. Asthmatic bronchitis.  5. Fibromuscular dysplasia.  6. Osteoporosis.  7. Hyperlipidemia.  8. Right knee cartilage repair.  9. Rotator cuff surgery.  10.Lumpectomy.   ALLERGIES:  1. NONSTEROIDALS.  2. PENICILLIN.  3. SULFA.  4. CAPOTEN.   MEDICATIONS:  1. Imdur 30 mg daily.  2. Toprol 100 mg daily.  3. Zoloft 100 mg daily.  4. TriCor 160 mg a day.  5. Singulair 10 mg a day.  6. Foradil.  7. Pulmicort.  8. Xanax.  9. Aspirin 81 mg b.i.d.  10.Calcium.  11.Zetia 10 mg a day.  12.Synthroid 50 mcg daily.  13.Protonix 40 mg b.i.d.  14.Omega-3 fatty acid.   REVIEW OF SYSTEMS:  As stated in the HPI, otherwise negative for other  systems.   PHYSICAL EXAMINATION:  GENERAL: The patient is no distress.  VITAL SIGNS: Blood pressure 140/79, heart rate 65 and regular, weight 151  pounds, body mass index 28.  HEENT: Eyelids unremarkable.  Pupils equal, round, and reactive to light.  Fundi not visualized.  Oral mucosa unremarkable.  NECK: No jugular venous distention. Waveform within normal limits.  Carotid  upstroke brisk and symmetric.  No bruits or thyromegaly.  LYMPHATICS: No cervical, axially, or inguinal adenopathy.  LUNGS:  Clear to auscultation bilaterally.  BACK: No costovertebral angle tenderness.  CHEST:  Unremarkable.  HEART: PMI not displaced or sustained.  S1 and S2 within normal limits.  N0  S3, no S4, no murmurs.  ABDOMEN: Mildly obese, positive bowel sounds, normal in frequency and pitch.  No bruits, rebound, guarding, or midline pulsatile mass. No hepatomegaly or  splenomegaly.  SKIN: No rashes, no nodules.  EXTREMITIES:  2+ pulses.  No edema, clubbing, or cyanosis.  NEUROLOGIC: Oriented to person, place, and time.  Cranial nerves II-XII  grossly intact.  Motor grossly intact.   EKG: Sinus rhythm, rate 65, axis within normal limits, intervals within  normal limits. No acute ST-T wave changes.   ASSESSMENT AND PLAN:  1. Chest discomfort: The patient is getting some chest discomfort with a      few typical but mostly atypical features.  She does have obstructive      coronary disease previously as described.  She had some atypical      symptoms then.  Because of this, believe probably of obstructive      coronary disease is at least moderate.  She needs stress perfusion      study but would  not be able to walk.  She will have adenosine      Cardiolite.  2. Risk reduction: She is having very close and aggressive followup of her      lipids by Dr. Christell Constant, and I will defer to him.  3. Hypertension: Blood pressure is very slightly elevated but has not been      previously.  Should keep an eye on this, but I would not change her      medicines at this point.  4. Followup: Will see her back yearly if she is not having any problems      and if her stress perfusion study is okay.                               Rollene Rotunda, MD, [facility]    JH/MedQ  DD:  09/08/2006  DT:  09/10/2006  Job #:  [id number]   cc:   Ernestina Penna, M.D.

## 2011-05-03 NOTE — Discharge Summary (Signed)
NAMEENRIKA, AGUADO                 ACCOUNT NO.:  [id number]   MEDICAL RECORD NO.:  [id number]          PATIENT TYPE:  INP   LOCATION:  4714                         FACILITY:  [facility]   PHYSICIAN:  Rollene Rotunda, M.D.   DATE OF BIRTH:  21-Dec-1935   DATE OF ADMISSION:  09/28/2004  DATE OF DISCHARGE:                                 DISCHARGE SUMMARY   TENTATIVE DATE OF DISCHARGE:  October 02, 2004.   PROCEDURE:  1.  Cardiac catheterization.  2.  Coronary arteriogram.  3.  Left ventriculogram.   HOSPITAL COURSE:  Ms. Courts is a 75 year old female followed by Dr.  Antoine Poche with a history of a stent to the ramus intermedius whose last cath  was in April 2003.  Her stent was patent, but she has an 80% lesion that was  treated medically.  A Cardiolite in May 2005 showed no ischemia and an EF of  74%.  Ms. Kochanowski was seen in the office on September 28, 2004 for intermittent  burning chest pain.  She has a history of gastroesophageal reflux disease  and she got some relief with nitroglycerin.  Dr. Antoine Poche was concerned for  ischemia as the cause for her symptoms so she was admitted for further  evaluation and treatment.   The patient's enzymes were negative for MI.  She remained pain-free on  heparin and IV nitroglycerin  her TSH was within normal limits.  Initially  she was reported as having an abnormal D-dimer, but, in fact, this was not  done; it must have been a test on another patient.  It was felt that because  of her history of coronary artery disease a cardiac catheterization was the  best option to evaluate her anatomy.  This was performed on October 01, 2004.  The cardiac catheterization showed a 20% left main, 40% LAD and 70%  second diagonal, similar to the films from 2003.  The circumflex had a 40%  proximal stenosis and a 40% lesion was in the OM-1.  The RCA had 20%  stenosis in the midportion.  The ramus intermedius stent was  widely patent.  She had some ________  her EF  was 70% with no MR.  Dr. Eden Emms felt that  medical therapy was the best option for her.  She had no progression of  disease from 2003 with a negative Cardiolite in May.  He felt that she  should follow up with Dr. Marina Goodell and get an EGD at his discretion.  If she  does well overnight she is tentatively considered stable for discharge on  October 02, 2004.   DISCHARGE CONDITION:  Stable.   DISCHARGE DIAGNOSES:  1.  Chest pain, no progression of coronary artery disease since 2003 with a      negative Cardiolite in 2005.  2.  Status post percutaneous transluminal coronary angioplasty with      Rotablator and stent to the ramus intermedius.  3.  Preserved left ventricular function with an ejection fraction of 70% by      catheterization this admission.  4.  Hypertension.  5.  Gastroesophageal reflux disease.  6.  History of asthmatic bronchitis.  7.  Allergies to PENICILLIN, SULFA, NAPROSYN, CAPOTEN, and ASA 325.  8.  Arthritis.  9.  Insomnia.   DISCHARGE INSTRUCTIONS:  1.  The patient's activity level is to include no strenuous activity and no      driving for two days.  2.  The patient is to stick to a low-fat diet.  3.  The patient is to call the office for problems with the cath site.  4.  The patient is to follow up with Dr. Antoine Poche on November 03rd at 3 P.M.      and she is follow up with Dr. Marina Goodell as well, and call for an      appointment.  5.  The patient is to see Dr. Christell Constant as needed.   DISCHARGE MEDICATIONS:  1.  Imdur 30 mg daily.  2.  Protonix 40 mg daily.  3.  Toprol XL 100 mg daily.  4.  Zoloft 100 mg daily.  5.  Actonel 35 mg q. week.  6.  Tricor 160 mg daily.  7.  Singulair 10 mg daily.  8.  Xanax 0.5 mg daily.  9.  Aspirin 81 mg daily.  10. Pravachol 80 mg daily.  11. Zetia 10 mg daily.  12. _________ Pulmicort as at home.  13. Nitroglycerin sublingual p.r.n.       RB/MEDQ  D:  10/01/2004  T:  10/01/2004  Job:  [id number]   cc:   Rudi Heap, M.D.   [facility]   Wilhemina Bonito. Marina Goodell, M.D. [facility]

## 2011-05-03 NOTE — Cardiovascular Report (Signed)
[place]  Patient:    BEKAH, IGOE Visit Number: [id number] MRN: [id number]          Service Type: CAT Location: [id number] Attending Physician:  Rollene Rotunda Dictated by:   Rollene Rotunda, M.D. [facility] Proc. Date: 03/24/02 Admit Date:  03/24/2002   CC:         Monica Becton, M.D.   Cardiac Catheterization  DATE OF BIRTH: [birth year]  PRIMARY PHYSICIAN: Monica Becton, M.D.  PROCEDURE: Left heart cardiac catheterization/coronary arteriography.  INDICATIONS: Evaluate patient with exercise-induced shortness of breath and diaphoresis that is new in onset and consistent with probable unstable angina.  DESCRIPTION OF PROCEDURE: Left heart catheterization was performed via the right femoral artery.  The artery was cannulated using an anterior wall puncture.  A #6 French arterial sheath was inserted via the modified Seldinger technique.  Preformed Judkins and a pigtail catheter were utilized.  Of note, a 3.5 Judkins was utilized to cannulate the left main.  The patient tolerated the procedure well and left the lab in stable condition.  RESULTS:  HEMODYNAMICS: LV 138/15, AO 138/79.  CORONARY ARTERIOGRAPHY: Left main coronary artery: Left main was normal.  LAD: The LAD was calcified in the proximal and mid segment. There were proximal and mid luminal irregularities. A small first diagonal was subtotally occluded at the ostium. A second diagonal was a medium sized with an ostial 30% stenosis.  Circumflex: The circumflex in the AV groove had a proximal 25% stenosis and a mid 50% stenosis before a large posterolateral. There was a large ramus intermediate with a long proximal 80% stenosis with calcification.  Right coronary artery: The right coronary artery was a dominant vessel. There were luminal irregularities.  LEFT VENTRICULOGRAM: The left ventriculogram was obtained in the RAO projection.  The EF was somewhat  difficult to estimate secondary to ventricular ectopy. However, it did appear to be well preserved in approximately 65% with no wall motion abnormalities.  DISTAL AORTOGRAM: A distal aortogram was obtained secondary to the patients history of fibromuscular dysplasia. There were no high-grade renal artery lesions. There was a 25% left renal artery stenosis.  CONCLUSIONS: High-grade, single-vessel coronary disease. Well preserved left ventricular function.  PLAN: The patient will have percutaneous revascularization of the ramus intermediate per Dr. Loraine Leriche Pulsipher. She will continue to have aggressive secondary risk factor modification. Dictated by:   Rollene Rotunda, M.D. [facility] Attending Physician:  Rollene Rotunda DD:  03/24/02 TD:  03/25/02 Job: [id number]

## 2011-05-03 NOTE — Consult Note (Signed)
NAMEGRACIANA, SESSA NO.:  [id number]   MEDICAL RECORD NO.:  [id number]          PATIENT TYPE:  INP   LOCATION:  1436                         FACILITY:  [facility]   PHYSICIAN:  Antonietta Breach, M.D.  DATE OF BIRTH:  06/22/1936   DATE OF CONSULTATION:  04/03/2007  DATE OF DISCHARGE:                                 CONSULTATION   REQUESTING PHYSICIAN:  Valma Cava, M.D.   REASON FOR CONSULTATION:  Psychosis.   HISTORY OF PRESENT ILLNESS:  Mrs. Aleeya Veitch is a 75 year old female  admitted to the [facility] on March 17, 2007.   Mrs. Dejarnett has been suffering diarrhea and incontinence.  She has had  blood in her stool.   She has experienced the perception as if she is sitting on the wall and  looking down at her body.  She also has had some mild confusion.  On the  day of examination she is oriented to all spheres.  She has not had any  further perceptual disturbances as of today.  She is not having thoughts  of harming herself or others.  Her thought process is coherent today.  She is cooperative with staff and healthcare.   She is oriented to all spheres.   She has continued with her general medical evaluation and she has been  treated with K-Dur.   She does have a baseline condition of feeling on-edge, excess worry and  insomnia.  She has been treated on an outpatient basis with Xanax as  well as Zoloft 100 mg daily.  She does not display any excess worry at  this time.  Her energy is mildly decreased.   PAST PSYCHIATRIC HISTORY:  The patient does have a history of the  anxiety symptoms as mentioned above.  Depression is mentioned in the  medical record.  However, the patient currently has a normal level of  interests.  Her energy is decreased.   PAST PSYCHOTROPIC MEDICATION:  Includes Xanax and she has required 1 mg  of Xanax q.h.s. for insomnia.  She has been on Zoloft 100 mg daily as an  outpatient up unto the point of admission.   FAMILY  PSYCHIATRIC HISTORY:  None known.   SOCIAL HISTORY:  Mrs. Rikard is living alone.  She has been independent  in her activities of daily living.  Marital status:  Widowed.  She does  not use illegal drugs or alcohol.  Occupation:  Retired.  Children:  Five.   GENERAL MEDICAL PROBLEMS:  1. Diarrhea.  2. Hematochezia.  3. Hypertension.   MEDICATIONS:  The MAR is reviewed.  The patient's psychotropics include  Zoloft 100 mg q.h.s.   She is allergic to  1. PENICILLIN.  2. SULFA.  3. NONSTEROIDAL ANTI-INFLAMMATORY AGENTS.  4. CAPOTEN.  5. NOROXIN.   LABORATORY DATA:  WBC 14.9, hemoglobin 12.6, platelet count 402.  The  patient's sodium is still decreased at 125.  Her potassium is 4.0,  glucose mildly elevated at 131, BUN is 7, creatinine 0.39, SGOT is 48,  SGPT 43, albumin 3.0, magnesium 1.5, calcium 9.0  The TSH is normal at 1.287.   Head CT without contrast on April 02, 2007, showed microvascular white  matter disease but no acute abnormality.   REVIEW OF SYSTEMS:  CONSTITUTIONAL:  Afebrile.  HEAD:  No trauma.  EYES:  No visual changes.  EARS:  No hearing impairment.  NOSE:  No rhinorrhea.  MOUTH/THROAT:  No sore throat.  NEUROLOGIC:  As above.  PSYCHIATRIC:  As  above.  CARDIOVASCULAR:  No chest pain.  RESPIRATORY:  No cough.  GASTROINTESTINAL:  As above.  GENITOURINARY:  No dysuria.  SKIN:  Unremarkable.  MUSCULOSKELETAL:  No deformities.  ENDOCRINE/METABOLIC:  As above.  HEMATOLOGIC/LYMPHATIC:  Unremarkable.   MENTAL STATUS EXAMINATION:  Mrs. Erny is a pleasant, elderly female  appearing her chronologic age of 89, lying in a supine position in the  hospital bed with good eye contact.  She is oriented completely to the  year, the month, day of the month, day of the week, place and person.  Her memory is intact to immediate, recent and remote.  Her fund of  knowledge and intelligence are within normal limits.  Her speech  involves normal rate and prosody. Thought process  logical, coherent,  goal-directed.  No looseness of associations.  Thought content:  No  thoughts of harming herself, no thoughts of harming others, no  delusions, no hallucinations.  Affect is slightly anxious at baseline  but with a broad, appropriate response.  Mood is within normal limits.  Insight is partial.  Judgment is intact.  She is well-groomed.   ASSESSMENT:  AXIS I:  1. Code 293.00, delirium due to general medical problems, mild,      currently resolved.  2. Code 293.84, anxiety disorder not otherwise specified.  3. Rule out depressive disorder not otherwise specified, resolved      except for decreased energy which can be attributed to her general      medical condition.  AXIS II:  Deferred.  AXIS III:  See general medical problems.  AXIS IV:  General medical.  AXIS V:  55.   Mrs. Arment's mild delirium is currently resolved.  However, it could  return.   RECOMMENDATIONS:  1. Would continue her Zoloft at 100 mg daily for antianxiety.  2. No antipsychotic treatment is needed.  Would check for diagnostic      evaluation the following items:  RPR, B12, folic acid and liver      function panel.      Antonietta Breach, M.D.  Electronically Signed     JW/MEDQ  D:  04/05/2007  T:  04/06/2007  Job:  [id number]

## 2011-05-03 NOTE — Op Note (Signed)
[name], NAPPIER                 ACCOUNT NO.:  [id number]   MEDICAL RECORD NO.:  [id number]          PATIENT TYPE:  AMB   LOCATION:  ENDO                         FACILITY:  [facility]   PHYSICIAN:  Wilhemina Bonito. Marina Goodell, M.D. [facility] OF BIRTH:  June 03, 1936   DATE OF PROCEDURE:  04/16/2005  DATE OF DISCHARGE:  04/16/2005                                 OPERATIVE REPORT   PROCEDURE:  Esophageal manometry.   INDICATIONS:  Heartburn, regurgitation, dysphagia.   HISTORY:  This is a 75 year old who has been evaluated for the above-  mentioned problems.  She has undergone endoscopy and abdominal ultrasound  previously, as well, remote manometry demonstrating increased lower  esophageal sphincter resting pressures.  She is now for a repeat manometry  some years later, this to exclude the progression to achalasia.   PROCEDURE:  The patient reported to the [facility] GI  laboratory Apr 16, 2005.  Manometry was performed in the standard manner.   FINDINGS:  1.  The upper esophageal sphincter demonstrated normal relaxation and      coordination.  2.  The esophageal body demonstrated normal wave amplitude and normal wave      propagation, thus normal peristalsis.  3.  The lower esophageal sphincter resting pressure was again elevated.      Calculated pressure of 63.2 mmHg, with the upper limit of normal being      45 mmHg.  However, with wet swallowing, normal and complete relaxation      was demonstrated.   IMPRESSION:  Slightly hypertensive lower esophageal sphincter resting  pressure.  Otherwise normal manometry.  Uncertain that this finding would  explain the patient's symptoms.   RECOMMENDATIONS:  Follow up in the office as planned.      JNP/MEDQ  D:  04/24/2005  T:  04/24/2005  Job:  [id number]

## 2011-05-03 NOTE — Assessment & Plan Note (Signed)
[facility]                            CARDIOLOGY OFFICE NOTE   [name], [name]                        MRN:          [id number]  DATE:02/10/2007                            DOB:          1936-03-23    REFERRING PHYSICIAN:  Ernestina Penna, M.D.   REASON FOR PRESENTATION:  Patient with coronary disease.   HISTORY OF PRESENT ILLNESS:  This patient is a pleasant 75 year old  white female with coronary disease as described below.  Last year she  was having some sporadic chest discomfort.  In October, she had a stress  perfusion study, which demonstrated an EF of 75% with no high-risk  features.  There was a questionable fixed distal anteroseptal infarct  versus breast attenuation.   Since that time, she has had no new symptoms.  She will rarely get chest  discomfort, which has been diagnosed as possible esophageal spasm.  She  will rarely take a nitroglycerin for this.  However, she has had no  other new symptoms, and no change in this.  There has been neck or arm  discomfort.  She has had no new shortness of breath.  She does not have  PND or orthopnea.  She is able to vacuum her house.  She is a little bit  limited by knee pain, however.  She goes out shopping.  With this, she  cannot bring on any symptoms.  She is considering knee replacement.   PAST MEDICAL HISTORY:  1. Coronary artery disease (catheterization October 01, 2004 with 20%      left main stenosis, 40% LAD stenosis, 70% 2nd diagonal stenosis,      40% proximal circumflex stenosis, 40% lesion of the 1st obtuse      marginal, right coronary artery 20% stenosis.  The patient had      patent stents in the ramus intermedius).  2. Gastroesophageal reflux disease.  3. Esophageal stricture.  4. Asthmatic bronchitis.  5. Fibromuscular dysplasia.  6. Osteoporosis.  7. Hyperlipidemia.  8. Right knee cartilage repair.  9. Rotator cuff surgery.  10.Lumpectomy.   ALLERGIES:  Intolerance  to:  1. NONSTEROIDALS.  2. PENICILLIN.  3. SULFA.  4. CAPOTEN.  5. ADULT ASPIRIN.   MEDICATIONS:  1. Imdur 30 mg daily.  2. Toprol XL 100 mg daily.  3. Zoloft 100 mg daily.  4. Tricor 160 mg daily.  5. Singulair.  6. Foradil.  7. Pulmicort.  8. Xanax.  9. Aspirin 81 mg b.i.d.  10.Calcium.  11.Zetia 10 mg daily.  12.Synthroid 50 mg daily.  13.Protonix 40 mg daily.  14.Omega 3 fatty acids.  15.Multivitamin.  16.K-Dur 10 mg daily.  17.Crestor (the patient is not clear on the dose).   REVIEW OF SYSTEMS:  As stated in the HPI, and otherwise negative for  other systems.   PHYSICAL EXAMINATION:  Patient is in no distress.  Blood pressure 116/72.  Heart rate 72 and regular.  Weight 144 pounds.  Body mass index 27.  HEENT:  Eyes unremarkable.  Pupils equal, round and reactive to light.  Fundi not visualized.  Oral  mucosa unremarkable.  NECK:  No jugular venous distention at 45 degrees.  Carotid upstroke  brisk and symmetric.  No bruits.  No thyromegaly.  LYMPHATICS:  No cervical, axillary or inguinal adenopathy.  LUNGS:  Clear to auscultation bilaterally.  BACK:  No costovertebral angle tenderness.  CHEST:  Unremarkable.  HEART:  PMI not displaced or sustained.  S1 and S2 within normal limits.  No S3.  No S4.  No clicks.  No rubs.  No murmurs.  ABDOMEN:  Flat.  Positive bowel sounds.  Normal in frequency and pitch.  No bruits.  No rebound.  No guarding.  No midline pulsatile mass.  No  hepatomegaly.  No splenomegaly.  SKIN:  No rashes.  No nodules.  EXTREMITIES:  Two plus pulses throughout.  No edema.  No cyanosis.  No  clubbing.  NEUROLOGIC:  Oriented to person, place and time.  Cranial nerves 2  through 12 grossly intact. Motor grossly intact.   EKG:  Sinus rhythm.  Rate 68.  Axis within normal limits.  Intervals  within normal limits.  Borderline PR prolongation.  No acute ST or T  wave changes.   ASSESSMENT AND PLAN:  1. Coronary disease.  Patient is having no new  symptoms.  No further      cardiovascular testing is suggested.  She is having aggressive risk      reduction per Dr. Christell Constant.  No cardiovascular testing is suggested.  2. Preoperative evaluation.  The patient will be an acceptable risk      for the planned orthopedic surgery according to ACC/AHA guidelines.      No further cardiovascular testing is suggested.  3. Hypertension.  Blood pressure is well controlled.  She will      continue on the medications as listed.  4. Followup.  I will see her back in 12 months, or sooner if needed.     Rollene Rotunda, MD, [facility]  Electronically Signed    JH/MedQ  DD: 02/10/2007  DT: 02/10/2007  Job #: [id number]   cc:   Ernestina Penna, M.D.  Valma Cava, MD

## 2011-05-03 NOTE — Op Note (Signed)
NAME:  [name], [name]                           ACCOUNT NO.:  [id number]   MEDICAL RECORD NO.:  [id number]                   PATIENT TYPE:  AMB   LOCATION:  DSC                                  FACILITY:  [facility]   PHYSICIAN:  Erasmo Leventhal, M.D.         DATE OF BIRTH:  [birth year]   DATE OF PROCEDURE:  02/22/2003  DATE OF DISCHARGE:                                 OPERATIVE REPORT   PREOPERATIVE DIAGNOSIS:  Left shoulder large retracted rotator cuff tear.   POSTOPERATIVE DIAGNOSIS:  Left shoulder large retracted rotator cuff tear.   PROCEDURE:  Left shoulder open exploration with subacromial decompression  and associated rotator cuff repair.   SURGEON:  Erasmo Leventhal, M.D.   ANESTHESIA:  Preoperative scalene block, general.   ESTIMATED BLOOD LOSS:  Less than 20 mL.   DRAINS:  None.   COMPLICATIONS:  None.   DISPOSITION:  PACU stable.   DESCRIPTION OF PROCEDURE:  The patient was counseled in the holding area and  the correct side was identified, IV was started, antibiotics given, the  block was administered.  She was then taken to the operating room and placed  in the supine position with general anesthesia.  She was then placed in the  modified beach chair position.  The right shoulder was examined with full  range of motion and stable.  Prepped with Duraprep and all draped in sterile  fashion.  The overlying skin was infiltrated with 5 mL of 0.25% Marcaine  with epinephrine after being put under anesthesia.  An oblique incision made  through the skin and subcutaneous tissue, skin flaps were developed.  At  this point in time the deltoid was split through the deltoid raphe and a  very small amount of anterior deltoid was released off the anterior  acromion.  At this point in time I made sure that I did not destabilize the  coracoacromial arch.  The CA ligament was left intact, and she has an os  acromiale and it was not taken down, but I felt like it was  asymptomatic.  There was minimal movement there.  She was found to have an extremely large  rotator cuff tear.  The axillary nerve was followed off and protect  throughout the entire case, and a protection suture was placed at 4 cm  distal at the tip of the acromion.  Subacromial bursectomy was performed  because she had a very thickened and inflamed subacromial bursa.  At this  point in time we began an extra-articular and intra-articular with  retraction sutures in the tendon.  It was mobilized to be repaired.  Now the  posterior, being split between the infraspinatus and teres minor, was  identified.  It was closed with a #2 Fibrewire suture in a mattress fashion.  The biceps tendon was found to be intact.  The rotator cuff insertion was  prepared on the greater tuberosity region just  lateral to the articular  surface with a rongeur.  Arthrex bio-absorbable anchors were placed there  and then this was repaired back down into the bone into nice, bleeding bone,  giving a nice tight repair of the rotator cuff with her arm to her side.  I  also did a very light anterior inferior acromioplasty and with a saw  smoothed down later to help decompress the area but, again, not to  destabilize the coracoacromial arch.  The rotator cuff was nicely repaired  at this time back to the anatomic insertion.  All wounds were copiously  irrigated.   A meticulous closure of the deltoid was done back to the anterior acromion  through the thick covering of tissue that was noted on the approach with #1  Vicryl reinforced laterally, and the protection suture was removed.  The  subcu closed with Vicryl, skin closed with staples.  Each layer was  irrigated during the closure.  For anesthesia another 10 mL of 0.25%  Marcaine with epinephrine was injected in the wound edge for hemostasis.  A  sterile compressive dressing applied to the shoulder.  The patient was  placed in a shoulder immobilizer, given another  gram of Ancef intravenously,  and awakened, taken from the operating room to PACU in stable condition.  Excellent pulses in the hand at the end of the case.  There were no  complications.  The plan will be stabilization in the PACU and overnight  stay for observation.                                               Erasmo Leventhal, M.D.    RAC/MEDQ  D:  02/22/2003  T:  02/23/2003  Job:  [id number]

## 2011-05-03 NOTE — Op Note (Signed)
[facility]  Patient:    [name], [name]                        MRN: [id number] Proc. Date: 04/23/00 Adm. Date:  [id number] Disc. Date: [id number] Attending:  Erasmo Leventhal CC:         R. Valma Cava, M.D.                           Operative Report  PREOPERATIVE DIAGNOSIS:  Right shoulder large retracted rotator cuff tear.  POSTOPERATIVE DIAGNOSIS:  Right shoulder large retracted rotator cuff tear.  PROCEDURE:  Right shoulder open subacromial decompression, rotator cuff repair.  SURGEON:  R. Valma Cava, M.D.  ASSISTANT:  Alexzandrew L. Perkins, P.A.-C.  ANESTHESIA:  General endotracheal with a preoperative interscalen block.  ESTIMATED BLOOD LOSS:  Less than 50 cc.  DRAINS:  None.  COMPLICATIONS:  None.  DISPOSITION:  To PACU, stable.  OPERATIVE DETAILS:  Patient was counseled in the holding area.  Correct side was identified appropriately.  IV was started and antibiotics were given.  Interscalen block was administered per Dr. Lestine Box.  She was taken to the operating room, placed in a supine position, given general endotracheal anesthesia, placed in a modified beach chair position and right shoulder examined; good range of motion and stable.  Prepped with Duraprep and draped in a sterile fashion.  Anterolateral incision was made through the skin and subcutaneous tissue and small bleeders electrocoagulated.  The deltoid was released partially from the anterior aspect of the acromion and split distally for a total of 5 cm.  A tag suture was placed then placed there to protect the axillary nerve.  Axillary nerve was thought of and protected throughout the entire case.  A small amount of deltoid was released for the subacromial decompression.  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  She has extensive subacromial bursitis and a subacromial bursectomy was performed.  Unfortunately, she had a very large retracted rotator cuff tear which was scarified and, in addition, the rotator cuff itself was of poor quality.  At this point in time, meticulously I advanced, released and mobilized the rotator cuff appropriately, beginning extra-articular and then intra-articular.  The rotator cuff could be advanced back to the edge of the articular cartilage for reinsertion to the greater tuberosity.  This area was then prepared appropriately.  Sutures of #2 Ethibond were placed as a tag and retractor sutures in the rotator cuff.  Rotator cuff had also split posteriorly through the infraspinatus and this was repaired under direct visualization with a #2 Ethibond suture.  Three Arthrex corkscrew suture anchors were placed at the appropriate level. The cuff was sequentially sutured to the humeral head and greater tuberosity just lateral to the articular surface.  This was done with the arm to the side.  The arm was put through a gentle range of motion.  There was no undue pressure on the suture site and the cuff remained intact.  Each layer had been irrigated during the procedure to keep the tissue moist and also irrigated free of any bleeding.  Hemostasis had been obtained.  For meticulous closure, reapproximation of the deltoid was done back to the anterior aspect of the acromion.  The os acromiale was intact and protected. The tag suture was  removed from the deltoid distally.  Axillary nerve head was intact and palpated and found to be intact at the end of the case.  Subcu was closed with Vicryl; skin closed with a 3-0 subcuticular Mersilene suture. Sterile dressing was applied.  She was placed on a small abduction pillow. Sponge and needle count was correct.  There were no complications.  She was awakened, extubated and taken from the operating room to PACU in  stable condition. DD:  04/23/00 TD:  04/25/00 Job: [id number]

## 2011-05-15 ENCOUNTER — Ambulatory Visit: Payer: Medicare Other | Admitting: Cardiology

## 2011-05-16 ENCOUNTER — Encounter: Payer: Self-pay | Admitting: Cardiology

## 2011-05-16 NOTE — Telephone Encounter (Signed)
Error

## 2011-05-19 ENCOUNTER — Other Ambulatory Visit: Payer: Self-pay | Admitting: Internal Medicine

## 2011-05-24 ENCOUNTER — Ambulatory Visit (INDEPENDENT_AMBULATORY_CARE_PROVIDER_SITE_OTHER): Payer: Medicare Other | Admitting: Pulmonary Disease

## 2011-05-24 ENCOUNTER — Encounter: Payer: Self-pay | Admitting: Pulmonary Disease

## 2011-05-24 DIAGNOSIS — J45909 Unspecified asthma, uncomplicated: Secondary | ICD-10-CM

## 2011-05-24 DIAGNOSIS — J019 Acute sinusitis, unspecified: Secondary | ICD-10-CM

## 2011-05-24 MED ORDER — BUDESONIDE-FORMOTEROL FUMARATE 160-4.5 MCG/ACT IN AERO: 2.0000 | INHALATION_SPRAY | Freq: Two times a day (BID) | RESPIRATORY_TRACT | Status: DC

## 2011-05-24 NOTE — Progress Notes (Signed)
  Subjective:    Patient ID: [name], female    DOB: 02-17-1936, 75 y.o.   MRN: [id number]  HPI 75/F ex smoker  with reactive airway disease - ? GERD vs sinusitis, nml pFTs She smoked a PPD x 20-25 yrs before quitting in 1990. She developed a chest cold in dec & took 3 months & several rounds of Abx & steroids to  get better. SHe underwent esophageal dilation for hypertensive LES (Dr Juanda Chance)  & is on protonix two times a day . Evaluation for chest pain in 6/10 (Dr Antoine Poche) has shown non obstructive CAD - attributed to esophageal spasm.Sucralfate added by GI to zegerid . CXR 1/25 'bronchitic ' changes Took macrodantin x 2 yrs for UTIs , stopped '10      Prednisone makes her hyper and nervous.  Has osteoporosis with fractures. PFTs nml '11 .  RAST  - IgE 225 -high but no sensitivity to common indoor/ outdoor allergens  September 25, 2010 3:42 PM  c/o coughing & wheezing on occasion, levaquin from 9/28-10/5 helped but symptoms recurred after stopping, cannot sing in choir, c/o nasal drainage, no heartburn or spasms x 1 mnth - feels similar to her episode in dec '10  05/24/2011 Underwent endoscopic sinus surgery 1/12 by dr Pollyann Kennedy Seen by Dr Delton Coombes -Treated x 6 wks with clinda for chronic sinusitis, started wheezing again when ABx stopped>> symbicort was stopped, uses albuterol nebs/ or pro-air    Review of Systems Pt denies any significant  nasal congestion or excess secretions, fever, chills, sweats, unintended wt loss, pleuritic or exertional cp, orthopnea pnd or leg swelling.  Pt also denies any obvious fluctuation in symptoms with weather or environmental change or other alleviating or aggravating factors.    Pt denies any increase in rescue therapy over baseline, denies waking up needing it or having early am exacerbations or coughing/wheezing/ or dyspnea      Objective:   Physical Exam Gen. Pleasant, well-nourished, in no distress ENT - no lesions, no post nasal drip, upper airway  pseudowheeze Neck: No JVD, no thyromegaly, no carotid bruits Lungs: no use of accessory muscles, no dullness to percussion, clear without rales or rhonchi  Cardiovascular: Rhythm regular, heart sounds  normal, no murmurs or gallops, no peripheral edema Musculoskeletal: No deformities, no cyanosis or clubbing          Assessment & Plan:

## 2011-05-24 NOTE — Patient Instructions (Signed)
Stay on prilosec Get back on symbicort 160 2 puffs twice daily Use nettie pot daily Stay on singulair

## 2011-05-27 NOTE — Assessment & Plan Note (Signed)
Treat triggers Albuterol prn Restart symbicort

## 2011-05-27 NOTE — Assessment & Plan Note (Signed)
Completed 6 wk course of ABx S/p FESS

## 2011-06-12 ENCOUNTER — Ambulatory Visit (INDEPENDENT_AMBULATORY_CARE_PROVIDER_SITE_OTHER): Payer: Medicare Other | Admitting: Cardiology

## 2011-06-12 ENCOUNTER — Encounter: Payer: Self-pay | Admitting: Cardiology

## 2011-06-12 DIAGNOSIS — E785 Hyperlipidemia, unspecified: Secondary | ICD-10-CM

## 2011-06-12 DIAGNOSIS — I2510 Atherosclerotic heart disease of native coronary artery without angina pectoris: Secondary | ICD-10-CM

## 2011-06-12 DIAGNOSIS — I10 Essential (primary) hypertension: Secondary | ICD-10-CM

## 2011-06-12 NOTE — Patient Instructions (Signed)
Follow up in 1 year with Dr Hochrein.  You will receive a letter in the mail 2 months before you are due.  Please call us when you receive this letter to schedule your follow up appointment.  Continue current medications  

## 2011-06-12 NOTE — Progress Notes (Signed)
HPI The patient presents for followup of known coronary disease. Since I last saw her she has had no new cardiovascular problems. She denies any chest pressure, neck or arm discomfort. She denies any new palpitations, presyncope or syncope. She's had no new shortness of breath. She rarely has some atypical chest discomfort for which she will take nitroglycerin. However, this has been stable pattern. Unfortunately recently she's been somewhat limited by right ankle pain.  Allergies  Allergen Reactions  . Aspirin     REACTION: regular strength  . Captopril   . Naproxen   . Pantoprazole Sodium   . Penicillins   . Sulfonamide Derivatives     Current Outpatient Prescriptions  Medication Sig Dispense Refill  . albuterol (PROVENTIL) (2.5 MG/3ML) 0.083% nebulizer solution 2-3 times daily as needed       . ALPRAZolam (XANAX) 0.5 MG tablet Take 0.5 mg by mouth at bedtime as needed.        Marland Kitchen aspirin 81 MG chewable tablet Chew 81 mg by mouth daily.        . B Complex-C-E-Zn (VITA ZINC PO) Take 1 capsule by mouth daily.        . budesonide-formoterol (SYMBICORT) 160-4.5 MCG/ACT inhaler Inhale 2 puffs into the lungs 2 (two) times daily.  1 Inhaler  5  . Calcium Carbonate-Vitamin D (CALCIUM 600+D) 600-400 MG-UNIT per tablet Take 1 tablet by mouth 2 (two) times daily.        . cholecalciferol (VITAMIN D) 1000 UNITS tablet Take 1,000 Units by mouth 2 (two) times daily.        Marland Kitchen dicyclomine (BENTYL) 10 MG capsule TAKE 1 OR 2 CAPSULES BY MOUTH EVERY 6 HOURS AS NEEDED FOR SPASMS  60 capsule  1  . fluticasone (FLONASE) 50 MCG/ACT nasal spray 2 sprays by Nasal route 2 (two) times daily.  120 Act  5  . isosorbide mononitrate (IMDUR) 60 MG 24 hr tablet Take 60 mg by mouth daily.        Marland Kitchen levothyroxine (SYNTHROID, LEVOTHROID) 50 MCG tablet Take 50 mcg by mouth daily.        . metoprolol (LOPRESSOR) 50 MG tablet Take 50 mg by mouth 2 (two) times daily.        . montelukast (SINGULAIR) 10 MG tablet Take 10 mg by  mouth at bedtime.        . Multiple Vitamin (MULTIVITAMIN) capsule Take 1 capsule by mouth daily.        . nitroGLYCERIN (NITROSTAT) 0.4 MG SL tablet Place 0.4 mg under the tongue every 5 (five) minutes as needed.        Marland Kitchen omega-3 acid ethyl esters (LOVAZA) 1 G capsule As directed       . omeprazole (PRILOSEC) 40 MG capsule Take 40 mg by mouth daily before breakfast.        . ondansetron (ZOFRAN) 4 MG tablet Take 4 mg by mouth every 12 (twelve) hours as needed.        . rosuvastatin (CRESTOR) 20 MG tablet Take 20 mg by mouth daily.        . sertraline (ZOLOFT) 100 MG tablet Take 100 mg by mouth daily.        Marland Kitchen DISCONTD: dicyclomine (BENTYL) 20 MG tablet Take 20 mg by mouth every 6 (six) hours.        Marland Kitchen DISCONTD: loratadine (CLARITIN) 10 MG tablet Take 1 tablet (10 mg total) by mouth daily.  30 tablet  5    Past Medical  History  Diagnosis Date  . GERD (gastroesophageal reflux disease)   . Anxiety disorder   . Arthritis   . CAD (coronary artery disease)     Cath May 2010.  Nonbstructive  . Depression   . Hypertension   . Hypothyroid   . Asthmatic bronchitis     Past Surgical History  Procedure Date  . Coronary angioplasty with stent placement 2003  . Total knee arthroplasty   . Back surgery     ROS:  As stated in the HPI and negative for all other systems.  PHYSICAL EXAM BP 142/62  Pulse 65  Resp 16  Ht 5\' 2"  (1.575 m)  Wt 145 lb (65.772 kg)  BMI 26.52 kg/m2 GENERAL:  Well appearing HEENT:  Pupils equal round and reactive, fundi not visualized, oral mucosa unremarkable NECK:  No jugular venous distention, waveform within normal limits, carotid upstroke brisk and symmetric, no bruits, no thyromegaly LYMPHATICS:  No cervical, inguinal adenopathy LUNGS:  Clear to auscultation bilaterally BACK:  No CVA tenderness CHEST:  Unremarkable HEART:  PMI not displaced or sustained,S1 and S2 within normal limits, no S3, no S4, no clicks, no rubs, no murmurs ABD:  Flat, positive bowel  sounds normal in frequency in pitch, no bruits, no rebound, no guarding, no midline pulsatile mass, no hepatomegaly, no splenomegaly EXT:  2 plus pulses throughout, no edema, no cyanosis no clubbing SKIN:  No rashes no nodules NEURO:  Cranial nerves II through XII grossly intact, motor grossly intact throughout PSYCH:  Cognitively intact, oriented to person place and time  EKG:  Sinus rhythm, rate 65, axis within normal limits, intervals within normal limits, no acute ST-T wave changes  ASSESSMENT AND PLAN

## 2011-06-12 NOTE — Assessment & Plan Note (Signed)
She had an excellent lipid profile that will continue to be followed by Dr. Christell Constant.

## 2011-06-12 NOTE — Assessment & Plan Note (Signed)
The patient has no new sypmtoms.  No further cardiovascular testing is indicated.  We will continue with aggressive risk reduction and meds as listed.  

## 2011-06-12 NOTE — Assessment & Plan Note (Signed)
The BP is very slightly elevated.  No change in therapy is indicated although we should follow this to make sure this mild elevation is not a trend.

## 2011-07-29 ENCOUNTER — Telehealth: Payer: Self-pay | Admitting: Pulmonary Disease

## 2011-07-29 MED ORDER — AZITHROMYCIN 250 MG PO TABS: ORAL_TABLET | ORAL | Status: AC

## 2011-07-29 MED ORDER — PREDNISONE 10 MG PO TABS: ORAL_TABLET | ORAL | Status: DC

## 2011-07-29 NOTE — Telephone Encounter (Signed)
Spoke with pt and she c/o wheezing, cough w/ yellow phlem, increased sob, chest tightness off and on for sometime now. Pt states she has been using her nebulizer medications as directed, her symbicort, proair at least once a day. Pt also been taking robitussin every 4 hours as still has had no relief. Pt states she doesn't feel like coming in for an OV but would like something called in. Pt was last seen 05/24/11. Please advise Dr. Vassie Loll. Thanks  Allergies  Allergen Reactions  . Aspirin     REACTION: regular strength  . Captopril   . Naproxen   . Pantoprazole Sodium   . Penicillins   . Sulfonamide Derivatives

## 2011-07-29 NOTE — Telephone Encounter (Signed)
Z-pak  Low dose pred 20 mg x 5 ds, 10 mg x 5ds Stay on prilosec & other meds incl symbicort Call for oV  if no better in 3 days

## 2011-07-29 NOTE — Telephone Encounter (Signed)
Spoke with patient-aware of recs and Rx's sent to [facility]. Pt understands to call the office for OV in no better in 3 days.

## 2011-08-05 ENCOUNTER — Other Ambulatory Visit: Payer: Self-pay | Admitting: Cardiology

## 2011-08-07 ENCOUNTER — Telehealth: Payer: Self-pay | Admitting: Pulmonary Disease

## 2011-08-07 NOTE — Telephone Encounter (Signed)
Pt says she only has 1 more dose of prednisone and the wheezing has not cleared up completely. She does feel better but woul dlike to be seen to make sure nothing else is needed. Pt scheduled to see TP on Thurs., 8/23/@ 11:15 am.

## 2011-08-08 ENCOUNTER — Ambulatory Visit (INDEPENDENT_AMBULATORY_CARE_PROVIDER_SITE_OTHER): Payer: Medicare Other | Admitting: Adult Health

## 2011-08-08 ENCOUNTER — Encounter: Payer: Self-pay | Admitting: Adult Health

## 2011-08-08 VITALS — BP 112/68 | HR 64 | Temp 96.8°F | Ht 62.0 in | Wt 147.0 lb

## 2011-08-08 DIAGNOSIS — J209 Acute bronchitis, unspecified: Secondary | ICD-10-CM

## 2011-08-08 MED ORDER — HYDROCODONE-HOMATROPINE 5-1.5 MG/5ML PO SYRP: 5.0000 mL | ORAL_SOLUTION | Freq: Four times a day (QID) | ORAL | Status: AC | PRN

## 2011-08-08 NOTE — Patient Instructions (Addendum)
Finish Prednisone as directed  Mucinex DM Twice daily  As needed  Cough Claritin 10mg  daily As needed  Drainage  Saline nasal rinses As needed   Hydromet 1 tsp every 6 hr As needed  Cough -may make you are sleepy.  Please contact office for sooner follow up if symptoms do not improve or worsen or seek emergency care  follow up Dr. Vassie Loll  In 6-8 weeks

## 2011-08-08 NOTE — Assessment & Plan Note (Signed)
Resolving flare   Plan:  Finish Prednisone as directed  Mucinex DM Twice daily  As needed  Cough Claritin 10mg  daily As needed  Drainage  Saline nasal rinses As needed   Hydromet 1 tsp every 6 hr As needed  Cough -may make you are sleepy.  Please contact office for sooner follow up if symptoms do not improve or worsen or seek emergency care  follow up Dr. Vassie Loll  In 6-8 weeks

## 2011-08-08 NOTE — Progress Notes (Signed)
  Subjective:    Patient ID: [name], female    DOB: 10/29/1936, 75 y.o.   MRN: [id number]  HPI  75/F ex smoker  with reactive airway disease - ? GERD vs sinusitis, nml pFTs She smoked a PPD x 20-25 yrs before quitting in 1990. She developed a chest cold in dec & took 3 months & several rounds of Abx & steroids to  get better. SHe underwent esophageal dilation for hypertensive LES (Dr Juanda Chance)  & is on protonix two times a day . Evaluation for chest pain in 6/10 (Dr Antoine Poche) has shown non obstructive CAD - attributed to esophageal spasm.Sucralfate added by GI to zegerid . CXR 1/25 'bronchitic ' changes Took macrodantin x 2 yrs for UTIs , stopped '10      Prednisone makes her hyper and nervous.  Has osteoporosis with fractures. PFTs nml '11 .  RAST  - IgE 225 -high but no sensitivity to common indoor/ outdoor allergens  September 25, 2010 3:42 PM  c/o coughing & wheezing on occasion, levaquin from 9/28-10/5 helped but symptoms recurred after stopping, cannot sing in choir, c/o nasal drainage, no heartburn or spasms x 1 mnth - feels similar to her episode in dec '10  05/24/2011 Underwent endoscopic sinus surgery 1/12 by dr Pollyann Kennedy Seen by Dr Delton Coombes -Treated x 6 wks with clinda for chronic sinusitis, started wheezing again when ABx stopped>> symbicort was stopped, uses albuterol nebs/ or pro-air  08/08/11 Follow up  Pt presents for follow up . Had recent cough, congestion and drainge for last 2 weeks. Was phoned in a zpack and steroid taper. She is feeling better and is here for check up.  Has finshed abx and today is last dose of steroids. No chest pain, discolored mucus, or fever.    Review of Systems Constitutional:   No  weight loss, night sweats,  Fevers, chills, fatigue, or  lassitude.  HEENT:   No headaches,  Difficulty swallowing,  Tooth/dental problems, or  Sore throat,                No sneezing, itching, ear ache, nasal congestion, post nasal drip,   CV:  No chest pain,   Orthopnea, PND, swelling in lower extremities, anasarca, dizziness, palpitations, syncope.   GI  No heartburn, indigestion, abdominal pain, nausea, vomiting, diarrhea, change in bowel habits, loss of appetite, bloody stools.   Resp:  No non-productive cough,  No coughing up of blood.   Marland Kitchen  No chest wall deformity  Skin: no rash or lesions.  GU: no dysuria, change in color of urine, no urgency or frequency.  No flank pain, no hematuria   MS:  No joint pain or swelling.  No decreased range of motion.  No back pain.  Psych:  No change in mood or affect. No depression or anxiety.  No memory loss.        Objective:   Physical Exam  Gen. Pleasant, well-nourished, in no distress ENT - no lesions, no post nasal drip, upper airway pseudowheeze Neck: No JVD, no thyromegaly, no carotid bruits Lungs: Coarse BS w/ no wheezing no use of accessory muscles, no dullness to percussion Cardiovascular: Rhythm regular, heart sounds  normal, no murmurs or gallops, no peripheral edema Musculoskeletal: No deformities, no cyanosis or clubbing          Assessment & Plan:

## 2011-08-26 ENCOUNTER — Telehealth: Payer: Self-pay | Admitting: Adult Health

## 2011-08-26 NOTE — Telephone Encounter (Signed)
Pt called to cancel her apt with TP on Thursday. Pt states at her OV on 8/28 she was told to f/u with RA in 6-8 weeks. Apt was cancelled per pt

## 2011-08-29 ENCOUNTER — Ambulatory Visit: Payer: Medicare Other | Admitting: Adult Health

## 2011-09-10 ENCOUNTER — Encounter: Payer: Self-pay | Admitting: Cardiology

## 2011-09-10 LAB — CBC
HCT: 40.8
Hemoglobin: 13.7
MCHC: 33.6
MCV: 94.9
Platelets: 358
RBC: 4.3
RDW: 12.6
WBC: 7.7

## 2011-09-10 LAB — DIFFERENTIAL
Basophils Absolute: 0.1
Basophils Relative: 1
Eosinophils Absolute: 0.2
Eosinophils Relative: 2
Lymphocytes Relative: 42
Lymphs Abs: 3.2
Monocytes Absolute: 0.6
Monocytes Relative: 8
Neutro Abs: 3.6
Neutrophils Relative %: 47

## 2011-09-10 LAB — APTT: aPTT: 40 — ABNORMAL HIGH

## 2011-09-10 LAB — PROTIME-INR
INR: 1
Prothrombin Time: 13

## 2011-09-10 LAB — BASIC METABOLIC PANEL
BUN: 13
CO2: 26
Calcium: 10.1
Chloride: 99
Creatinine, Ser: 0.9
GFR calc Af Amer: >60
GFR calc non Af Amer: >60
Glucose, Bld: 87
Potassium: 4.6
Sodium: 134 — ABNORMAL LOW

## 2011-09-11 LAB — URINE CULTURE: Colony Count: >=100000

## 2011-09-11 LAB — CLOSTRIDIUM DIFFICILE EIA: C difficile Toxins A+B, EIA: NEGATIVE

## 2011-09-18 ENCOUNTER — Other Ambulatory Visit: Payer: Self-pay | Admitting: Emergency Medicine

## 2011-09-18 NOTE — Telephone Encounter (Signed)
This is a patient of Dr. Reginia Naas. RX sent back to the pharmacy under Dr. Vassie Loll, not Dr. Delton Coombes.

## 2011-11-18 ENCOUNTER — Telehealth: Payer: Self-pay | Admitting: Pulmonary Disease

## 2011-11-18 MED ORDER — FLUTICASONE PROPIONATE 50 MCG/ACT NA SUSP: 2.0000 | Freq: Every day | NASAL | Status: DC

## 2011-11-18 MED ORDER — MONTELUKAST SODIUM 10 MG PO TABS: 10.0000 mg | ORAL_TABLET | Freq: Every day | ORAL | Status: DC

## 2011-11-18 NOTE — Telephone Encounter (Signed)
Refills sent. Pt aware.  Shaden Lacher, CMA  

## 2011-11-29 ENCOUNTER — Other Ambulatory Visit: Payer: Self-pay | Admitting: *Deleted

## 2011-11-29 MED ORDER — LORATADINE 10 MG PO TABS: 10.0000 mg | ORAL_TABLET | Freq: Every day | ORAL | Status: DC

## 2011-11-29 NOTE — Telephone Encounter (Signed)
Received fax from [facility] for a refill on pt's loratadine 10 mg. Tammy wilson CMA approved medication x 5 refills and faxed form back to [facility]. I have updated medication refill in pt's chart.

## 2011-12-13 ENCOUNTER — Telehealth: Payer: Self-pay | Admitting: Pulmonary Disease

## 2011-12-13 MED ORDER — AZITHROMYCIN 250 MG PO TABS: ORAL_TABLET | ORAL | Status: AC

## 2011-12-13 NOTE — Telephone Encounter (Signed)
I spoke with pt and she c/o cough w/ thick green phlem, blowing out green phlem, nasal congestion, PND, sinus headache and sinus pressure x 4 days. Pt denies any fever. Pt has taken tylenol sinus. She is requesting to have a zpak called in for her. Please advise Dr. Vassie Loll, thanks  Allergies  Allergen Reactions  . Aspirin     REACTION: regular strength  . Captopril   . Naproxen   . Pantoprazole Sodium   . Penicillins   . Sulfonamide Derivatives     [facility]

## 2011-12-13 NOTE — Telephone Encounter (Signed)
Zpack was sent to pharm. Spoke with pt and notified that this was done.

## 2011-12-13 NOTE — Telephone Encounter (Signed)
zpak ok 

## 2011-12-17 HISTORY — PX: FOOT ARTHRODESIS, SUBTALAR: SUR53

## 2012-01-01 ENCOUNTER — Encounter: Payer: Self-pay | Admitting: Cardiology

## 2012-02-14 ENCOUNTER — Ambulatory Visit (INDEPENDENT_AMBULATORY_CARE_PROVIDER_SITE_OTHER): Payer: Medicare Other | Admitting: Cardiology

## 2012-02-14 ENCOUNTER — Encounter: Payer: Self-pay | Admitting: Cardiology

## 2012-02-14 VITALS — BP 130/90 | HR 67 | Ht 62.0 in | Wt 152.0 lb

## 2012-02-14 DIAGNOSIS — Z01810 Encounter for preprocedural cardiovascular examination: Secondary | ICD-10-CM

## 2012-02-14 DIAGNOSIS — I4891 Unspecified atrial fibrillation: Secondary | ICD-10-CM

## 2012-02-14 DIAGNOSIS — E785 Hyperlipidemia, unspecified: Secondary | ICD-10-CM

## 2012-02-14 DIAGNOSIS — I10 Essential (primary) hypertension: Secondary | ICD-10-CM

## 2012-02-14 NOTE — Assessment & Plan Note (Signed)
The blood pressure is at target. No change in medications is indicated. We will continue with therapeutic lifestyle changes (TLC).  

## 2012-02-14 NOTE — Assessment & Plan Note (Signed)
Her last HDL was 71 with an LDL of 76 in January. This is managed expertly by Dr. Christell Constant. No change in therapy is indicated.

## 2012-02-14 NOTE — Patient Instructions (Signed)
Your physician has requested that you have a lexiscan myoview. For further information please visit [email or web address]. Please follow instruction sheet, as given.  The current medical regimen is effective;  continue present plan and medications.  Follow up in 1 year with Dr Hochrein.  You will receive a letter in the mail 2 months before you are due.  Please call us when you receive this letter to schedule your follow up appointment.  

## 2012-02-14 NOTE — Progress Notes (Signed)
HPI The patient presents for followup of known coronary disease she is also preop for ankle surgery.  She has a history of CAD.  Since I last saw her she has been very limited in her activities because of right ankle pain. She's only able to walk a very short distance in her house. With this level of activity she's not getting chest discomfort or shortness of breath. However, she has had some resting chest and arm discomfort. She's not sure whether this is similar to previous angina. She also has esophageal spasm which has felt like this. She has taken sublingual nitroglycerin within the past week to treat this. She did not describe resting shortness of breath, PND or orthopnea. She does not have palpitations, presyncope or syncope.  Allergies  Allergen Reactions  . Aspirin     REACTION: regular strength  . Captopril   . Naproxen   . Pantoprazole Sodium   . Penicillins   . Sulfonamide Derivatives     Current Outpatient Prescriptions  Medication Sig Dispense Refill  . albuterol (PROVENTIL) (2.5 MG/3ML) 0.083% nebulizer solution 2-3 times daily as needed       . ALPRAZolam (XANAX) 0.5 MG tablet Take 0.5 mg by mouth at bedtime as needed.        . B Complex-C-E-Zn (VITA ZINC PO) Take 1 capsule by mouth daily.        . budesonide-formoterol (SYMBICORT) 160-4.5 MCG/ACT inhaler Inhale 2 puffs into the lungs 2 (two) times daily.  1 Inhaler  5  . Calcium Carbonate-Vitamin D (CALCIUM 600+D) 600-400 MG-UNIT per tablet Take 1 tablet by mouth daily.       . cholecalciferol (VITAMIN D) 1000 UNITS tablet Take 1,000 Units by mouth 2 (two) times daily.        Marland Kitchen dicyclomine (BENTYL) 10 MG capsule TAKE 1 OR 2 CAPSULES BY MOUTH EVERY 6 HOURS AS NEEDED FOR SPASMS  60 capsule  1  . fluticasone (FLONASE) 50 MCG/ACT nasal spray Place 2 sprays into the nose daily.  16 g  6  . isosorbide mononitrate (IMDUR) 60 MG 24 hr tablet TAKE ONE TABLET BY MOUTH ONE TIME DAILY  30 tablet  10  . levothyroxine (SYNTHROID,  LEVOTHROID) 50 MCG tablet Take 50 mcg by mouth daily.        . metoprolol (LOPRESSOR) 50 MG tablet Take 50 mg by mouth 2 (two) times daily.       . montelukast (SINGULAIR) 10 MG tablet Take 1 tablet (10 mg total) by mouth at bedtime.  30 tablet  6  . Multiple Vitamin (MULTIVITAMIN) capsule Take 1 capsule by mouth daily.        . nitroGLYCERIN (NITROSTAT) 0.4 MG SL tablet Place 0.4 mg under the tongue every 5 (five) minutes as needed.        Marland Kitchen omega-3 acid ethyl esters (LOVAZA) 1 G capsule As directed      . omeprazole (PRILOSEC) 40 MG capsule Take 40 mg by mouth daily before breakfast.        . ondansetron (ZOFRAN) 4 MG tablet Take 4 mg by mouth every 12 (twelve) hours as needed.        . rosuvastatin (CRESTOR) 20 MG tablet Take 20 mg by mouth daily.        . sertraline (ZOLOFT) 100 MG tablet Take 100 mg by mouth daily.        . Teriparatide, Recombinant, (FORTEO [place]) Inject into the skin daily.        Marland Kitchen  aspirin 81 MG chewable tablet Chew 81 mg by mouth daily.          Past Medical History  Diagnosis Date  . GERD (gastroesophageal reflux disease)   . Anxiety disorder   . Arthritis   . CAD (coronary artery disease)     Cath May 2010.  Nonbstructive  . Depression   . Hypertension   . Hypothyroid   . Asthmatic bronchitis     Past Surgical History  Procedure Date  . Coronary angioplasty with stent placement 2003  . Total knee arthroplasty   . Back surgery     ROS:  As stated in the HPI and negative for all other systems.  PHYSICAL EXAM BP 130/90  Pulse 67  Ht 5\' 2"  (1.575 m)  Wt 152 lb (68.947 kg)  BMI 27.80 kg/m2 GENERAL:  Well appearing HEENT:  Pupils equal round and reactive, fundi not visualized, oral mucosa unremarkable NECK:  No jugular venous distention, waveform within normal limits, carotid upstroke brisk and symmetric, no bruits, no thyromegaly LYMPHATICS:  No cervical, inguinal adenopathy LUNGS:  Clear to auscultation bilaterally BACK:  No CVA tenderness CHEST:   Unremarkable HEART:  PMI not displaced or sustained,S1 and S2 within normal limits, no S3, no S4, no clicks, no rubs, no murmurs ABD:  Flat, positive bowel sounds normal in frequency in pitch, no bruits, no rebound, no guarding, no midline pulsatile mass, no hepatomegaly, no splenomegaly EXT:  2 plus pulses throughout, no edema, no cyanosis no clubbing, right ankle in a soft brace. SKIN:  No rashes no nodules NEURO:  Cranial nerves II through XII grossly intact, motor grossly intact throughout [facility]:  Cognitively intact, oriented to person place and time  EKG:  Sinus rhythm, rate 67, axis within normal limits, intervals within normal limits, no acute ST-T wave changes 02/14/2012  ASSESSMENT AND PLAN

## 2012-02-14 NOTE — Assessment & Plan Note (Signed)
She has had no documented recent evidence of this. No change in therapy is indicated.

## 2012-02-14 NOTE — Assessment & Plan Note (Signed)
The patient has known coronary disease. She has not had recent stress testing. She does have some symptoms that could be anginal versus GI. She has a low functional level. She's going for a moderate risk surgery from a cardiovascular standpoint. Therefore, according to ACC/AHA guidelines stress testing is indicated preoperatively. She would not be a walker treadmill so she will have a [employer].

## 2012-02-25 ENCOUNTER — Other Ambulatory Visit: Payer: Self-pay | Admitting: Orthopedic Surgery

## 2012-02-25 DIAGNOSIS — M25562 Pain in left knee: Secondary | ICD-10-CM

## 2012-02-26 ENCOUNTER — Ambulatory Visit (HOSPITAL_COMMUNITY): Payer: Medicare Other | Attending: Cardiology | Admitting: Radiology

## 2012-02-26 VITALS — BP 151/98 | Ht 61.0 in | Wt 152.0 lb

## 2012-02-26 DIAGNOSIS — R079 Chest pain, unspecified: Secondary | ICD-10-CM | POA: Insufficient documentation

## 2012-02-26 DIAGNOSIS — I2510 Atherosclerotic heart disease of native coronary artery without angina pectoris: Secondary | ICD-10-CM

## 2012-02-26 DIAGNOSIS — R110 Nausea: Secondary | ICD-10-CM | POA: Insufficient documentation

## 2012-02-26 DIAGNOSIS — I739 Peripheral vascular disease, unspecified: Secondary | ICD-10-CM | POA: Insufficient documentation

## 2012-02-26 DIAGNOSIS — E785 Hyperlipidemia, unspecified: Secondary | ICD-10-CM | POA: Insufficient documentation

## 2012-02-26 DIAGNOSIS — Z8249 Family history of ischemic heart disease and other diseases of the circulatory system: Secondary | ICD-10-CM | POA: Insufficient documentation

## 2012-02-26 DIAGNOSIS — R0609 Other forms of dyspnea: Secondary | ICD-10-CM | POA: Insufficient documentation

## 2012-02-26 DIAGNOSIS — Z87891 Personal history of nicotine dependence: Secondary | ICD-10-CM | POA: Insufficient documentation

## 2012-02-26 DIAGNOSIS — R0989 Other specified symptoms and signs involving the circulatory and respiratory systems: Secondary | ICD-10-CM | POA: Insufficient documentation

## 2012-02-26 DIAGNOSIS — I10 Essential (primary) hypertension: Secondary | ICD-10-CM | POA: Insufficient documentation

## 2012-02-26 DIAGNOSIS — R0602 Shortness of breath: Secondary | ICD-10-CM

## 2012-02-26 DIAGNOSIS — I252 Old myocardial infarction: Secondary | ICD-10-CM | POA: Insufficient documentation

## 2012-02-26 DIAGNOSIS — R5381 Other malaise: Secondary | ICD-10-CM | POA: Insufficient documentation

## 2012-02-26 DIAGNOSIS — Z01810 Encounter for preprocedural cardiovascular examination: Secondary | ICD-10-CM

## 2012-02-26 DIAGNOSIS — R42 Dizziness and giddiness: Secondary | ICD-10-CM | POA: Insufficient documentation

## 2012-02-26 MED ORDER — REGADENOSON 0.4 MG/5ML IV SOLN
0.4000 mg | Freq: Once | INTRAVENOUS | Status: AC
  Administered 2012-02-26: 0.4 mg via INTRAVENOUS

## 2012-02-26 MED ORDER — TECHNETIUM TC 99M TETROFOSMIN IV KIT
30.0000 | PACK | Freq: Once | INTRAVENOUS | Status: AC | PRN
  Administered 2012-02-26: 30 via INTRAVENOUS

## 2012-02-26 MED ORDER — TECHNETIUM TC 99M TETROFOSMIN IV KIT
10.0000 | PACK | Freq: Once | INTRAVENOUS | Status: AC | PRN
  Administered 2012-02-26: 10 via INTRAVENOUS

## 2012-02-26 NOTE — Progress Notes (Signed)
[facility] SITE 3 NUCLEAR MED [address] [place] [address] [phone]  Cardiology Nuclear Med Study  [name] [name] is a 76 y.o. female [id number] 1936-02-14   Nuclear Med Background Indication for Stress Test:  Evaluation for Ischemia, Stent Patency, and Pending Surgical Clearance- (R) Ankle surgery-Dr.John Victorino Dike History:  '03 Myocardial Infarction treated with Stent to [facility]), 4/10 Myocardial Perfusion Study-NL, EF=73%, 5/10 Heart Catheterization-Nonobstructive CAD, patent Ramus stent, EF=60% Cardiac Risk Factors: Family History - CAD, History of Smoking, Hypertension, Lipids and PVD  Symptoms:  Chest Pain with/without exertion (last occurrence 2 months ago), Dizziness, DOE, Fatigue, Fatigue with Exertion and Nausea   Nuclear Pre-Procedure Caffeine/Decaff Intake:  None NPO After: 6:00am   Lungs:  Clear IV 0.9% NS with Angio Cath:  20g  IV Site: R Antecubital  IV Started by:  Stanton Kidney, EMT-P  Chest Size (in):  36 Cup Size: C  Height: 5\' 1"  (1.549 m)  Weight:  152 lb (68.947 kg)  BMI:  Body mass index is 28.72 kg/(m^2). Tech Comments:  Meds were taken as directed    Nuclear Med Study 1 or 2 day study: 1 day  Stress Test Type:  Lexiscan  Reading MD: Marca Ancona, MD  Order Authorizing Provider:  Rollene Rotunda, MD  Resting Radionuclide: Technetium 82m Tetrofosmin  Resting Radionuclide Dose: 10.9 mCi   Stress Radionuclide:  Technetium 28m Tetrofosmin  Stress Radionuclide Dose: 33.0 mCi           Stress Protocol Rest HR: 63 Stress HR: 78  Rest BP: 151/98 Stress BP: 153/95  Exercise Time (min): n/a METS: n/a   Predicted Max HR: 147 bpm % Max HR: 53.06 bpm Rate Pressure Product: 21308   Dose of Adenosine (mg):  n/a Dose of Lexiscan: 0.4 mg  Dose of Atropine (mg): n/a Dose of Dobutamine: n/a mcg/kg/min (at max HR)  Stress Test Technologist: Irean Hong, RN  Nuclear Technologist:  Domenic Polite, CNMT     Rest Procedure:   Myocardial perfusion imaging was performed at rest 45 minutes following the intravenous administration of Technetium 55m Tetrofosmin. Rest ECG: NSR with 1st degree AVB  Stress Procedure:  The patient received IV Lexiscan 0.4 mg over 15-seconds.  Technetium 26m Tetrofosmin injected at 30-seconds.  There were no significant changes with Lexiscan. The patient complained of chest tightness. Quantitative spect images were obtained after a 45 minute delay. Stress ECG: No significant change from baseline ECG  QPS Raw Data Images:  Normal; no motion artifact; normal heart/lung ratio. Stress Images:  Normal homogeneous uptake in all areas of the myocardium. Rest Images:  Normal homogeneous uptake in all areas of the myocardium. Subtraction (SDS):  There is no evidence of scar or ischemia. Transient Ischemic Dilatation (Normal <1.22):  1.10 Lung/Heart Ratio (Normal <0.45):  0.34  Quantitative Gated Spect Images QGS EDV:  63 ml QGS ESV:  17 ml QGS cine images:  NL LV Function; NL Wall Motion QGS EF: 72%  Impression Exercise Capacity:  Lexiscan with no exercise. BP Response:  Normal blood pressure response. Clinical Symptoms:  Tightness in chest ECG Impression:  No significant ST segment change suggestive of ischemia. Comparison with Prior Nuclear Study: No significant change from previous study  Overall Impression:  Normal stress nuclear study.  [name]

## 2012-02-28 ENCOUNTER — Telehealth: Payer: Self-pay | Admitting: Pulmonary Disease

## 2012-02-28 MED ORDER — AZITHROMYCIN 250 MG PO TABS: ORAL_TABLET | ORAL | Status: AC

## 2012-02-28 NOTE — Telephone Encounter (Signed)
I spoke with pt and is aware rx was sent to [facility]. Nothing further was needed

## 2012-02-28 NOTE — Telephone Encounter (Signed)
Ok for z pak.

## 2012-02-28 NOTE — Telephone Encounter (Signed)
I spoke with pt and she c/o cough w/ green phlem, wheezing, chest tightness, nasal congestion, PND, little sore throat, and some nausea x 3 days. Denies any fever, chills, sweats, vomiting. Pt has tried taking regular mucinex and mucinex for sinus w/o relief. Pt is requesting a zpak be called in for her. i offered OV to pt but refused stating she can barely walk today due to her arthritis. Please advise Dr. Vassie Loll thanks  Allergies  Allergen Reactions  . Aspirin     REACTION: regular strength  . Captopril   . Naproxen   . Pantoprazole Sodium   . Penicillins   . Sulfonamide Derivatives       kmart madison

## 2012-03-01 ENCOUNTER — Other Ambulatory Visit: Payer: Medicare Other

## 2012-03-01 ENCOUNTER — Ambulatory Visit
Admission: RE | Admit: 2012-03-01 | Discharge: 2012-03-01 | Disposition: A | Discharge status: Home / Self Care | Payer: Medicare Other | Source: Ambulatory Visit | Attending: Orthopedic Surgery | Admitting: Orthopedic Surgery

## 2012-03-01 DIAGNOSIS — M25562 Pain in left knee: Secondary | ICD-10-CM

## 2012-03-04 ENCOUNTER — Telehealth: Payer: Self-pay | Admitting: Cardiology

## 2012-03-04 NOTE — Telephone Encounter (Signed)
Will forward to Dr Hochrein for review  

## 2012-03-04 NOTE — Telephone Encounter (Signed)
Pt needs surgical clearance for poss knee surgery, dr Victorino Dike fax [phone], and to  pcp dr Rudi Heap fax [phone]

## 2012-03-06 ENCOUNTER — Telehealth: Payer: Self-pay | Admitting: Cardiology

## 2012-03-06 NOTE — Telephone Encounter (Signed)
Pt wants to make sure this is done asap

## 2012-03-06 NOTE — Telephone Encounter (Signed)
Clearance faxed as requested

## 2012-03-06 NOTE — Telephone Encounter (Deleted)
Pt calling surgical clearance for dr Victorino Dike Ginette Otto

## 2012-03-16 ENCOUNTER — Other Ambulatory Visit: Payer: Self-pay | Admitting: Pulmonary Disease

## 2012-03-16 MED ORDER — BUDESONIDE-FORMOTEROL FUMARATE 160-4.5 MCG/ACT IN AERO: 2.0000 | INHALATION_SPRAY | Freq: Two times a day (BID) | RESPIRATORY_TRACT | Status: DC

## 2012-03-19 NOTE — Pre-Procedure Instructions (Signed)
20 [name]  03/19/2012   Your procedure is scheduled on:  April 11@ 1230  Report to [facility] at 1030 AM.  Call this number if you have problems the morning of surgery: [phone]   Remember:   Do not eat food:After Midnight.  May have clear liquids: up to 4 Hours before arrival.  Clear liquids include soda, tea, black coffee, apple or grape juice, broth.  Take these medicines the morning of surgery with A SIP OF WATER: PRILOSEC,ZOLOFT,METOPROLOL,ISOSORBIDE,SYNTHROID   Do not wear jewelry, make-up or nail polish.  Do not wear lotions, powders, or perfumes. You may wear deodorant.  Do not shave 48 hours prior to surgery.  Do not bring valuables to the hospital.  Contacts, dentures or bridgework may not be worn into surgery.  Leave suitcase in the car. After surgery it may be brought to your room.  For patients admitted to the hospital, checkout time is 11:00 AM the day of discharge.   Patients discharged the day of surgery will not be allowed to drive home.  Name and phone number of your driver: FAMILY  Special Instructions: CHG Shower Use Special Wash: 1/2 bottle night before surgery and 1/2 bottle morning of surgery.   Please read over the following fact sheets that you were given: Coughing and Deep Breathing, MRSA Information and Surgical Site Infection Prevention

## 2012-03-20 ENCOUNTER — Encounter (HOSPITAL_COMMUNITY)
Admission: RE | Admit: 2012-03-20 | Discharge: 2012-03-20 | Disposition: A | Discharge status: Home / Self Care | Payer: Medicare Other | Source: Ambulatory Visit | Attending: Anesthesiology | Admitting: Anesthesiology

## 2012-03-20 ENCOUNTER — Encounter (HOSPITAL_COMMUNITY): Payer: Self-pay

## 2012-03-20 ENCOUNTER — Encounter (HOSPITAL_COMMUNITY)
Admission: RE | Admit: 2012-03-20 | Discharge: 2012-03-20 | Disposition: A | Discharge status: Home / Self Care | Payer: Medicare Other | Source: Ambulatory Visit | Attending: Orthopedic Surgery | Admitting: Orthopedic Surgery

## 2012-03-20 HISTORY — DX: Unspecified osteoarthritis, unspecified site: M19.90

## 2012-03-20 LAB — CBC
HCT: 37.9 % (ref 36.0–46.0)
Hemoglobin: 13.6 g/dL (ref 12.0–15.0)
MCH: 33.7 pg (ref 26.0–34.0)
MCHC: 35.9 g/dL (ref 30.0–36.0)
MCV: 93.8 fL (ref 78.0–100.0)
Platelets: 301 10*3/uL (ref 150–400)
RBC: 4.04 MIL/uL (ref 3.87–5.11)
RDW: 12.5 % (ref 11.5–15.5)
WBC: 9.1 10*3/uL (ref 4.0–10.5)

## 2012-03-20 LAB — SURGICAL PCR SCREEN
MRSA, PCR: NEGATIVE
Staphylococcus aureus: NEGATIVE

## 2012-03-20 LAB — BASIC METABOLIC PANEL
BUN: 15 mg/dL (ref 6–23)
CO2: 30 mEq/L (ref 19–32)
Calcium: 10 mg/dL (ref 8.4–10.5)
Chloride: 98 mEq/L (ref 96–112)
Creatinine, Ser: 0.63 mg/dL (ref 0.50–1.10)
GFR calc Af Amer: >90 mL/min (ref >90)
GFR calc non Af Amer: 87 mL/min — ABNORMAL LOW (ref >90)
Glucose, Bld: 87 mg/dL (ref 70–99)
Potassium: 4.5 mEq/L (ref 3.5–5.1)
Sodium: 137 mEq/L (ref 135–145)

## 2012-03-20 NOTE — Progress Notes (Signed)
Stress test in epic  Done for surgery clearence.

## 2012-03-20 NOTE — Pre-Procedure Instructions (Signed)
20 Parys WINONA SISON  03/20/2012   Your procedure is scheduled on:  03/26/12  Report to [facility] at 1030 AM.  Call this number if you have problems the morning of surgery: [phone]   Remember:   Do not eat food:After Midnight.  May have clear liquids: up to 4 Hours before arrival.  Clear liquids include soda, tea, black coffee, apple or grape juice, broth.  Take these medicines the morning of surgery with A SIP OF WATER: all inhalers,xanax,flonase,synthroid,imdur,metoprolol,singular,prilosec,zoloft   Do not wear jewelry, make-up or nail polish.  Do not wear lotions, powders, or perfumes. You may wear deodorant.  Do not shave 48 hours prior to surgery.  Do not bring valuables to the hospital.  Contacts, dentures or bridgework may not be worn into surgery.  Leave suitcase in the car. After surgery it may be brought to your room.  For patients admitted to the hospital, checkout time is 11:00 AM the day of discharge.   Patients discharged the day of surgery will not be allowed to drive home.  Name and phone number of your driver: family  Special Instructions: CHG Shower Use Special Wash: 1/2 bottle night before surgery and 1/2 bottle morning of surgery.   Please read over the following fact sheets that you were given: Pain Booklet, Coughing and Deep Breathing, MRSA Information and Surgical Site Infection Prevention

## 2012-03-25 MED ORDER — CEFAZOLIN SODIUM-DEXTROSE 2-3 GM-% IV SOLR
2.0000 g | INTRAVENOUS | Status: DC
  Filled 2012-03-25: qty 50

## 2012-03-26 ENCOUNTER — Encounter (HOSPITAL_COMMUNITY): Payer: Self-pay | Admitting: *Deleted

## 2012-03-26 ENCOUNTER — Encounter (HOSPITAL_COMMUNITY)
Admission: RE | Disposition: A | Discharge status: Home / Self Care | Payer: Self-pay | Source: Ambulatory Visit | Attending: Orthopedic Surgery

## 2012-03-26 ENCOUNTER — Ambulatory Visit (HOSPITAL_COMMUNITY): Payer: Medicare Other | Admitting: *Deleted

## 2012-03-26 ENCOUNTER — Encounter (HOSPITAL_COMMUNITY): Admission: RE | Payer: Self-pay | Source: Ambulatory Visit

## 2012-03-26 ENCOUNTER — Ambulatory Visit (HOSPITAL_COMMUNITY): Admission: RE | Admit: 2012-03-26 | Payer: Medicare Other | Source: Ambulatory Visit | Admitting: Orthopedic Surgery

## 2012-03-26 ENCOUNTER — Ambulatory Visit (HOSPITAL_COMMUNITY)
Admission: RE | Admit: 2012-03-26 | Discharge: 2012-03-26 | Disposition: A | Discharge status: Home / Self Care | Payer: Medicare Other | Source: Ambulatory Visit | Attending: Orthopedic Surgery | Admitting: Orthopedic Surgery

## 2012-03-26 DIAGNOSIS — Z01818 Encounter for other preprocedural examination: Secondary | ICD-10-CM | POA: Insufficient documentation

## 2012-03-26 DIAGNOSIS — E039 Hypothyroidism, unspecified: Secondary | ICD-10-CM | POA: Insufficient documentation

## 2012-03-26 DIAGNOSIS — M23302 Other meniscus derangements, unspecified lateral meniscus, unspecified knee: Secondary | ICD-10-CM | POA: Insufficient documentation

## 2012-03-26 DIAGNOSIS — F329 Major depressive disorder, single episode, unspecified: Secondary | ICD-10-CM | POA: Insufficient documentation

## 2012-03-26 DIAGNOSIS — K219 Gastro-esophageal reflux disease without esophagitis: Secondary | ICD-10-CM | POA: Insufficient documentation

## 2012-03-26 DIAGNOSIS — M23305 Other meniscus derangements, unspecified medial meniscus, unspecified knee: Secondary | ICD-10-CM | POA: Insufficient documentation

## 2012-03-26 DIAGNOSIS — F3289 Other specified depressive episodes: Secondary | ICD-10-CM | POA: Insufficient documentation

## 2012-03-26 DIAGNOSIS — Z01812 Encounter for preprocedural laboratory examination: Secondary | ICD-10-CM | POA: Insufficient documentation

## 2012-03-26 DIAGNOSIS — S83289A Other tear of lateral meniscus, current injury, unspecified knee, initial encounter: Secondary | ICD-10-CM

## 2012-03-26 DIAGNOSIS — J45909 Unspecified asthma, uncomplicated: Secondary | ICD-10-CM | POA: Insufficient documentation

## 2012-03-26 DIAGNOSIS — I10 Essential (primary) hypertension: Secondary | ICD-10-CM | POA: Insufficient documentation

## 2012-03-26 DIAGNOSIS — M6580 Other synovitis and tenosynovitis, unspecified site: Secondary | ICD-10-CM | POA: Insufficient documentation

## 2012-03-26 HISTORY — PX: KNEE ARTHROSCOPY: SHX127

## 2012-03-26 SURGERY — ARTHRODESIS ANKLE
Anesthesia: General | Laterality: Right

## 2012-03-26 SURGERY — ARTHROSCOPY, KNEE
Anesthesia: General | Site: Knee | Laterality: Left | Wound class: Clean

## 2012-03-26 MED ORDER — FENTANYL CITRATE 0.05 MG/ML IJ SOLN
INTRAMUSCULAR | Status: DC | PRN
  Administered 2012-03-26: 50 ug via INTRAVENOUS
  Administered 2012-03-26: 100 ug via INTRAVENOUS

## 2012-03-26 MED ORDER — LACTATED RINGERS IV SOLN
INTRAVENOUS | Status: DC | PRN
  Administered 2012-03-26: 12:00:00 via INTRAVENOUS

## 2012-03-26 MED ORDER — BACITRACIN ZINC 500 UNIT/GM EX OINT
TOPICAL_OINTMENT | CUTANEOUS | Status: DC | PRN
  Administered 2012-03-26: 1 via TOPICAL

## 2012-03-26 MED ORDER — SODIUM CHLORIDE 0.9 % IR SOLN
Status: DC | PRN
  Administered 2012-03-26 (×2): 3000 mL

## 2012-03-26 MED ORDER — MIDAZOLAM HCL 2 MG/2ML IJ SOLN
1.0000 mg | INTRAMUSCULAR | Status: DC | PRN
  Administered 2012-03-26: 2 mg via INTRAVENOUS

## 2012-03-26 MED ORDER — PROPOFOL 10 MG/ML IV EMUL
INTRAVENOUS | Status: DC | PRN
  Administered 2012-03-26: 150 mg via INTRAVENOUS

## 2012-03-26 MED ORDER — MIDAZOLAM HCL 2 MG/2ML IJ SOLN
INTRAMUSCULAR | Status: AC
  Filled 2012-03-26: qty 2

## 2012-03-26 MED ORDER — DEXTROSE 5 % IV SOLN
INTRAVENOUS | Status: DC | PRN
  Administered 2012-03-26: 15:00:00 via INTRAVENOUS

## 2012-03-26 MED ORDER — FENTANYL CITRATE 0.05 MG/ML IJ SOLN: 50.0000 ug | INTRAMUSCULAR | Status: DC | PRN

## 2012-03-26 MED ORDER — LORAZEPAM 2 MG/ML IJ SOLN: 1.0000 mg | Freq: Once | INTRAMUSCULAR | Status: DC | PRN

## 2012-03-26 MED ORDER — HYDROCODONE-ACETAMINOPHEN 5-325 MG PO TABS: 1.0000 | ORAL_TABLET | Freq: Four times a day (QID) | ORAL | Status: AC | PRN

## 2012-03-26 MED ORDER — SODIUM CHLORIDE 0.9 % IV SOLN: INTRAVENOUS | Status: DC

## 2012-03-26 MED ORDER — HYDROMORPHONE HCL PF 1 MG/ML IJ SOLN
0.2500 mg | INTRAMUSCULAR | Status: DC | PRN
Start: 2012-03-26 — End: 2012-03-26

## 2012-03-26 MED ORDER — ONDANSETRON HCL 4 MG/2ML IJ SOLN
INTRAMUSCULAR | Status: DC | PRN
  Administered 2012-03-26: 4 mg via INTRAVENOUS

## 2012-03-26 MED ORDER — LACTATED RINGERS IV SOLN
INTRAVENOUS | Status: DC
  Administered 2012-03-26: 12:00:00 via INTRAVENOUS

## 2012-03-26 MED ORDER — VANCOMYCIN HCL IN DEXTROSE 1-5 GM/200ML-% IV SOLN
1000.0000 mg | Freq: Once | INTRAVENOUS | Status: AC
  Administered 2012-03-26: 1000 mg via INTRAVENOUS
  Filled 2012-03-26: qty 200

## 2012-03-26 MED ORDER — BUPIVACAINE-EPINEPHRINE 0.5% -1:200000 IJ SOLN
INTRAMUSCULAR | Status: DC | PRN
  Administered 2012-03-26: 20 mL

## 2012-03-26 MED ORDER — CHLORHEXIDINE GLUCONATE 4 % EX LIQD: 60.0000 mL | Freq: Once | CUTANEOUS | Status: DC

## 2012-03-26 SURGICAL SUPPLY — 43 items
BANDAGE ELASTIC 6 VELCRO ST LF (GAUZE/BANDAGES/DRESSINGS) ×1 IMPLANT
BANDAGE ESMARK 6X9 LF (GAUZE/BANDAGES/DRESSINGS) ×1 IMPLANT
BLADE CUDA 5.5 (BLADE) IMPLANT
BLADE GREAT WHITE 4.2 (BLADE) ×2 IMPLANT
BLADE SURG ROTATE 9660 (MISCELLANEOUS) IMPLANT
BNDG CMPR 9X6 STRL LF SNTH (GAUZE/BANDAGES/DRESSINGS) ×1
BNDG COHESIVE 6X5 TAN STRL LF (GAUZE/BANDAGES/DRESSINGS) ×2 IMPLANT
BNDG ESMARK 6X9 LF (GAUZE/BANDAGES/DRESSINGS) ×2
BOOTCOVER CLEANROOM LRG (PROTECTIVE WEAR) ×4 IMPLANT
BUR OVAL 6.0 (BURR) IMPLANT
CLOTH BEACON ORANGE TIMEOUT ST (SAFETY) ×2 IMPLANT
CUFF TOURNIQUET SINGLE 34IN LL (TOURNIQUET CUFF) IMPLANT
CUFF TOURNIQUET SINGLE 44IN (TOURNIQUET CUFF) IMPLANT
DRAPE ARTHROSCOPY W/POUCH 114 (DRAPES) ×2 IMPLANT
DRAPE U-SHAPE 47X51 STRL (DRAPES) ×2 IMPLANT
DRSG EMULSION OIL 3X3 NADH (GAUZE/BANDAGES/DRESSINGS) ×2 IMPLANT
DURAPREP 26ML APPLICATOR (WOUND CARE) ×2 IMPLANT
FILTER STRAW FLUID ASPIR (MISCELLANEOUS) ×2 IMPLANT
GLOVE BIOGEL PI IND STRL 7.0 (GLOVE) IMPLANT
GLOVE BIOGEL PI IND STRL 8 (GLOVE) ×2 IMPLANT
GLOVE BIOGEL PI INDICATOR 7.0 (GLOVE) ×1
GLOVE BIOGEL PI INDICATOR 8 (GLOVE) ×2
GLOVE SS BIOGEL STRL SZ 8 (GLOVE) ×1 IMPLANT
GLOVE SUPERSENSE BIOGEL SZ 8 (GLOVE) ×1
GOWN STRL NON-REIN LRG LVL3 (GOWN DISPOSABLE) ×6 IMPLANT
GOWN STRL REIN XL XLG (GOWN DISPOSABLE) ×1 IMPLANT
KIT BASIN OR (CUSTOM PROCEDURE TRAY) ×2 IMPLANT
KIT ROOM TURNOVER OR (KITS) ×2 IMPLANT
MANIFOLD NEPTUNE II (INSTRUMENTS) ×2 IMPLANT
NEEDLE 22X1 1/2 (OR ONLY) (NEEDLE) ×2 IMPLANT
PACK ARTHROSCOPY DSU (CUSTOM PROCEDURE TRAY) ×2 IMPLANT
PAD ARMBOARD 7.5X6 YLW CONV (MISCELLANEOUS) ×4 IMPLANT
SET ARTHROSCOPY TUBING (MISCELLANEOUS) ×2
SET ARTHROSCOPY TUBING LN (MISCELLANEOUS) ×1 IMPLANT
SPONGE GAUZE 4X4 12PLY (GAUZE/BANDAGES/DRESSINGS) ×2 IMPLANT
SPONGE LAP 4X18 X RAY DECT (DISPOSABLE) ×1 IMPLANT
SUT ETHILON 4 0 PS 2 18 (SUTURE) ×2 IMPLANT
SYR 30ML LL (SYRINGE) ×2 IMPLANT
SYR 3ML LL SCALE MARK (SYRINGE) ×2 IMPLANT
TOWEL OR 17X24 6PK STRL BLUE (TOWEL DISPOSABLE) ×2 IMPLANT
TUBE CONNECTING 12X1/4 (SUCTIONS) ×2 IMPLANT
WAND 90 DEG TURBOVAC W/CORD (SURGICAL WAND) IMPLANT
WATER STERILE IRR 1000ML POUR (IV SOLUTION) ×2 IMPLANT

## 2012-03-26 NOTE — Brief Op Note (Signed)
03/26/2012  4:06 PM  PATIENT:  [name]  76 y.o. female  PRE-OPERATIVE DIAGNOSIS:  Left knee lateral meniscus tear  POST-OPERATIVE DIAGNOSIS:  Left knee lateral and medial meniscus tears  Procedure(s): ARTHROSCOPY left KNEE with debridement of medial and lateral meniscus tears  SURGEON:  [name], MD  ASSISTANT: n/a  ANESTHESIA:   General  EBL:  minimal   TOURNIQUET:  n/a  COMPLICATIONS:  None apparent  DISPOSITION:  Extubated, awake and stable to recovery.  DICTATION ID:  [id number]

## 2012-03-26 NOTE — Transfer of Care (Signed)
Immediate Anesthesia Transfer of Care Note  Patient: [name]  Procedure(s) Performed: Procedure(s) (LRB): ARTHROSCOPY KNEE (Left)  Patient Location: PACU  Anesthesia Type: General  Level of Consciousness: awake  Airway & Oxygen Therapy: Patient Spontanous Breathing and Patient connected to nasal cannula oxygen  Post-op Assessment: Report given to PACU RN, Post -op Vital signs reviewed and stable and Patient moving all extremities  Post vital signs: Reviewed and stable  Complications: No apparent anesthesia complications

## 2012-03-26 NOTE — Discharge Instructions (Signed)
[name], MD [facility]  Please read the following information regarding your care after surgery.  Medications  You only need a prescription for the narcotic pain medicine (ex. oxycodone, Percocet, Norco).  All of the other medicines listed below are available over the counter. ? acetominophen (Tylenol) 650 mg every 4-6 hours as you need for minor pain ? oxycodone as prescribed for moderate to severe pain X norco as prescribed for pain   Narcotic pain medicine (ex. oxycodone, Percocet, Vicodin) will cause constipation.  To prevent this problem, take the following medicines while you are taking any pain medicine. X docusate sodium (Colace) 100 mg twice a day X senna (Senokot) 2 tablets twice a day  ? To help prevent blood clots, take an aspirin (325 mg) once a day for a month after surgery.  You should also get up every hour while you are awake to move around.    Weight Bearing X Bear weight when you are able on your operated leg or foot. ? Bear weight only on the heel of your operated foot in the post-op shoe. ? Do not bear any weight on the operated leg or foot.  Cast / Splint / Dressing ? Keep your splint or cast clean and dry.  Don't put anything (coat hanger, pencil, etc) down inside of it.  If it gets damp, use a hair dryer on the cool setting to dry it.  If it gets soaked, call the office to schedule an appointment for a cast change. X Remove your dressing 3 days after surgery and cover the incisions with dry dressings.    After your dressing, cast or splint is removed; you may shower, but do not soak or scrub the wound.  Allow the water to run over it, and then gently pat it dry.  Swelling It is normal for you to have swelling where you had surgery.  To reduce swelling and pain, keep your toes above your nose for at least 3 days after surgery.  It may be necessary to keep your foot or leg elevated for several weeks.  If it hurts, it should be elevated.  Follow  Up Call my office at [phone] when you are discharged from the hospital or surgery center to schedule an appointment to be seen two weeks after surgery.  Call my office at [phone] if you develop a fever >101.5 F, nausea, vomiting, bleeding from the surgical site or severe pain.

## 2012-03-26 NOTE — Anesthesia Preprocedure Evaluation (Signed)
Anesthesia Evaluation  Patient identified by MRN, date of birth, ID band Patient awake    History of Anesthesia Complications (+) PONV  Airway Mallampati: I TM Distance: >3 FB Neck ROM: Full    Dental   Pulmonary asthma ,    Pulmonary exam normal       Cardiovascular hypertension, + CAD + dysrhythmias     Neuro/Psych Depression  Neuromuscular disease    GI/Hepatic GERD-  ,  Endo/Other  Hypothyroidism   Renal/GU      Musculoskeletal   Abdominal   Peds  Hematology   Anesthesia Other Findings   Reproductive/Obstetrics                           Anesthesia Physical Anesthesia Plan  ASA: III  Anesthesia Plan: General   Post-op Pain Management:    Induction: Intravenous  Airway Management Planned: LMA  Additional Equipment:   Intra-op Plan:   Post-operative Plan: Extubation in OR  Informed Consent: I have reviewed the patients History and Physical, chart, labs and discussed the procedure including the risks, benefits and alternatives for the proposed anesthesia with the patient or authorized representative who has indicated his/her understanding and acceptance.     Plan Discussed with: CRNA and Surgeon  Anesthesia Plan Comments:         Anesthesia Quick Evaluation

## 2012-03-26 NOTE — Anesthesia Postprocedure Evaluation (Signed)
Anesthesia Post Note  Patient: [name]  Procedure(s) Performed: Procedure(s) (LRB): ARTHROSCOPY KNEE (Left)  Anesthesia type: General  Patient location: PACU  Post pain: Pain level controlled and Adequate analgesia  Post assessment: Post-op Vital signs reviewed, Patient's Cardiovascular Status Stable, Respiratory Function Stable, Patent Airway and Pain level controlled  Last Vitals:  Filed Vitals:   03/26/12 1630  BP: 146/80  Pulse: 62  Temp:   Resp: 16    Post vital signs: Reviewed and stable  Level of consciousness: awake, alert  and oriented  Complications: No apparent anesthesia complications

## 2012-03-26 NOTE — H&P (Signed)
[name] is an 76 y.o. female.   Chief Complaint: left knee pain HPI: 76 y/o female with severe pain in left knee for many months.  She has failed non operative treatment including activity modification, oral pain medicine, physical therapy and steroid injection.  MRI shows a lateral meniscus tear.  She presents now for arthroscopic debridement.  Past Medical History  Diagnosis Date  . GERD (gastroesophageal reflux disease)   . Anxiety disorder   . Arthritis   . CAD (coronary artery disease)     Cath May 2010.  Nonbstructive  . Depression   . Hypothyroid   . Asthmatic bronchitis   . PONV (postoperative nausea and vomiting)   . Hypertension     dr Antoine Poche  . OA (osteoarthritis)     Past Surgical History  Procedure Date  . Total knee arthroplasty   . Back surgery   . Coronary angioplasty with stent placement 2003  . Joint replacement     bil shoulders  . Sinus surgery with instatrak     Family History  Problem Relation Age of Onset  . Prostate cancer Brother   . Heart disease Mother   . Emphysema Sister   . Asthma Mother    Social History:  reports that she quit smoking about 22 years ago. Her smoking use included Cigarettes. She has a 15 pack-year smoking history. She has never used smokeless tobacco. She reports that she does not drink alcohol or use illicit drugs.  Allergies:  Allergies  Allergen Reactions  . Aspirin     REACTION: regular strength  . Captopril   . Naproxen   . Pantoprazole Sodium   . Penicillins   . Sulfonamide Derivatives     Medications Prior to Admission  Medication Dose Route Frequency Provider Last Rate Last Dose  . ceFAZolin (ANCEF) IVPB 2 g/50 mL premix  2 g Intravenous 60 min Pre-Op [name], MD       Medications Prior to Admission  Medication Sig Dispense Refill  . albuterol (PROVENTIL) (2.5 MG/3ML) 0.083% nebulizer solution 2-3 times daily as needed       . ALPRAZolam (XANAX) 0.5 MG tablet Take 0.5 mg by mouth at bedtime  as needed.        Marland Kitchen aspirin 81 MG chewable tablet Chew 81 mg by mouth daily.        . Calcium Carbonate-Vitamin D (CALCIUM 600+D) 600-400 MG-UNIT per tablet Take 1 tablet by mouth daily.       . cholecalciferol (VITAMIN D) 1000 UNITS tablet Take 1,000 Units by mouth 2 (two) times daily.        . fluticasone (FLONASE) 50 MCG/ACT nasal spray Place 2 sprays into the nose daily.  16 g  6  . metoprolol (LOPRESSOR) 50 MG tablet Take 50 mg by mouth 2 (two) times daily.       . montelukast (SINGULAIR) 10 MG tablet Take 1 tablet (10 mg total) by mouth at bedtime.  30 tablet  6  . Multiple Vitamin (MULTIVITAMIN) capsule Take 1 capsule by mouth daily.        Marland Kitchen omega-3 acid ethyl esters (LOVAZA) 1 G capsule Take 1 g by mouth daily.       . ondansetron (ZOFRAN) 4 MG tablet Take 4 mg by mouth every 12 (twelve) hours as needed. For nausea      . rosuvastatin (CRESTOR) 20 MG tablet Take 20 mg by mouth daily.        . sertraline (ZOLOFT)  100 MG tablet Take 100 mg by mouth daily.        . Teriparatide, Recombinant, (FORTEO [place]) Inject into the skin daily.         No results found for this or any previous visit (from the past 48 hour(s)). No results found.  ROS  No recent f/c/n/v/wt loss.  There were no vitals taken for this visit. Physical Exam wn wd woman in nad.  L knee with moderate effusion.  ROM 0-120.  5/5 strength.  Normal sens to LT.  TTP at lateral joint line.  Palpable pulses at foot.  A and O x 4.  Mood and affect normal.  EOMI.  Respirations unlabored.  Assessment/Plan L knee lateral meniscus tear - to OR for arthroscopic debridement.  The risks and benefits of the alternative treatment options have been discussed in detail.  The patient wishes to proceed with surgery and specifically understands risks of bleeding, infection, nerve damage, blood clots, need for additional surgery, amputation and death.   [name] 03-29-12, 9:19 AM

## 2012-03-27 ENCOUNTER — Encounter (HOSPITAL_COMMUNITY): Payer: Self-pay | Admitting: Orthopedic Surgery

## 2012-03-27 NOTE — Op Note (Signed)
NAMEVADIE, PRINCIPATO NO.:  [id number]  MEDICAL RECORD NO.:  [id number]  LOCATION:  PERIO                        FACILITY:  [facility]  PHYSICIAN:  [name], MD        DATE OF BIRTH:  [birth year]  DATE OF PROCEDURE:  03/26/2012 DATE OF DISCHARGE:                              OPERATIVE REPORT   PREOPERATIVE DIAGNOSIS:  Left knee lateral meniscus tear.  POSTOPERATIVE DIAGNOSIS:  Left knee lateral and medial meniscus tears.  PROCEDURE:  Left knee arthroscopy with debridement of medial and lateral meniscus tears.  SURGEON:  [name], MD  ANESTHESIA:  General.  ESTIMATED BLOOD LOSS:  Minimal.  TOURNIQUET TIME:  0.  COMPLICATIONS:  None apparent.  DISPOSITION:  Extubated, awake, and stable to recovery.  INDICATIONS FOR PROCEDURE:  The patient is a 76 year old woman who complains of left knee pain for several months.  She complains of mechanical symptoms.  She has failed treatment with activity modification, oral anti-inflammatories, and intra-articular steroid injection and physical therapy.  An MRI reveals a lateral meniscus tear. She presents now for operative treatment of this condition.  She understands the risks and benefits, the alternative treatment options, and elects to proceed with surgical treatment.  She specifically understands the risks of bleeding, infection, nerve damage, blood clots, need for additional surgery, amputation, and death.  PROCEDURE IN DETAIL:  After preoperative consent was obtained and the correct operative site was identified, the patient was brought to the operating room and placed supine on the operating table.  General anesthesia was induced.  Preoperative antibiotics were administered. Surgical time-out was taken.  Left lower extremity was prepped and draped in standard sterile fashion with a tourniquet around the thigh. An inferolateral arthroscopy portal was established, and superolateral outflow portal was  established.  The arthroscope was inserted into the knee joint.  Suprapatellar pouch was noted to have extensive synovitis. The medial and lateral patellar facets were noted to have diffuse grade 2 chondromalacia.  The medial and lateral gutters were noted to have extensive synovitis, but no loose body.  Trochlea was also noted to have grade 2 chondromalacia.  The medial compartment was examined.  The medial femoral condyle and medial tibial plateau were noted to have diffuse grade 2 chondromalacia.  Medial meniscus was noted to have a small radial tear.  This was debrided back to a stable rim with an arthroscopic shaver.  The intercondylar notch was noted to have extensive synovitis.  The ACL was noted to be intact.  The lateral compartment was entered.  A complex tear of the lateral meniscus was noted in the anterior horn body and posterior horn.  A combination of the shaver and arthroscopic biters were used to resect this tear back to a stable rim.  A very little of the body was left, but a portion of the anterior horn and posterior horn were remaining.  The arthroscopic instruments were all removed.  Horizontal mattress sutures of 3-0 nylon were used to close the arthroscopy portals.  Sterile dressings were applied followed by compression wrap.  The patient was awakened from anesthesia and transported to the recovery room in a stable condition.  FOLLOWUP  PLAN:  The patient will be discharged to home today.  She will begin physical therapy early next week.  She can bear weight as tolerated.  She will follow up with me in approximately 2 weeks for suture removal.     [name], MD     JH/MEDQ  D:  03/26/2012  T:  03/27/2012  Job:  [id number]

## 2012-04-16 ENCOUNTER — Ambulatory Visit: Payer: Medicare Other | Admitting: Physical Therapy

## 2012-04-20 ENCOUNTER — Ambulatory Visit: Payer: Medicare Other | Attending: Orthopedic Surgery | Admitting: Physical Therapy

## 2012-04-20 DIAGNOSIS — R5381 Other malaise: Secondary | ICD-10-CM | POA: Insufficient documentation

## 2012-04-20 DIAGNOSIS — IMO0001 Reserved for inherently not codable concepts without codable children: Secondary | ICD-10-CM | POA: Insufficient documentation

## 2012-04-20 DIAGNOSIS — M25569 Pain in unspecified knee: Secondary | ICD-10-CM | POA: Insufficient documentation

## 2012-04-20 DIAGNOSIS — M6281 Muscle weakness (generalized): Secondary | ICD-10-CM | POA: Insufficient documentation

## 2012-04-21 ENCOUNTER — Telehealth: Payer: Self-pay | Admitting: Pulmonary Disease

## 2012-04-21 MED ORDER — AZITHROMYCIN 250 MG PO TABS: ORAL_TABLET | ORAL | Status: AC

## 2012-04-21 NOTE — Telephone Encounter (Signed)
Dr. Vassie Loll [place] so I will forward message to doc-of the day.  Please advise. Carron Curie, CMA

## 2012-04-21 NOTE — Telephone Encounter (Signed)
I spoke with pt and she c/o severe productive cough w/ yellow phlem, wheezing, chest tx, chest congestion, and some vomiting x couple days. Denies any f/c/s/n. She has been taking OTC tylenol for cold symptoms. Pt last seen 08/08/11 and stated she can't come in for OV bc she just had left knee surgery and the joint in her R foot is completely gone. Pt is requesting to have something called in for her. Please advise RA, thanks  Allergies  Allergen Reactions  . Aspirin Other (See Comments)    REACTION: regular strength causes "heart to beat fast"  . Captopril Hypertension  . Naproxen Other (See Comments)    Tongue swelling  . Pantoprazole Sodium   . Penicillins     Swelling around site  . Sulfonamide Derivatives Hives     kmart madison

## 2012-04-21 NOTE — Telephone Encounter (Signed)
Pt called back. Since dr Vassie Loll is off pt wanted to know if this will be addressed by another dr soon so that pt can get someone to pick her rx up soon. [name]

## 2012-04-21 NOTE — Telephone Encounter (Signed)
Called, spoke with pt.  I informed her of below recs per TP.  She is aware zpak sent to [facility] in [place].  We scheduled her to see Dr. Vassie Loll on July 1 at 2:30 as this is his first available in [place].  Pt verbalized understanding of recs and appt.

## 2012-04-21 NOTE — Telephone Encounter (Signed)
Zpack take as directed. No refills  Mucinex DM Twice daily  As needed  Cough/congestion  Fluids and rest  Please contact office for sooner follow up if symptoms do not improve or worsen or seek emergency care  Set up ov with Dr. Vassie Loll  In 4 -6 weeks and As needed

## 2012-04-23 ENCOUNTER — Encounter: Payer: Medicare Other | Admitting: Physical Therapy

## 2012-04-23 NOTE — Telephone Encounter (Signed)
If no better in 3 days, pl schedule oV with TP

## 2012-04-23 NOTE — Telephone Encounter (Signed)
Spoke with pt. She states that she took her first dose of zithromax last night, and second dose today and she can tell a slight improvement. She states does not feel that she needs to come in, but will call very soon if not improving, or if she worsens.

## 2012-04-28 ENCOUNTER — Encounter: Payer: Self-pay | Admitting: Cardiology

## 2012-04-29 ENCOUNTER — Ambulatory Visit: Payer: Medicare Other | Admitting: Physical Therapy

## 2012-04-29 NOTE — Letter (Deleted)
Apr 28, 2012   Ricka Burdock [address] [facility] [address], [place] [address].  RE:  EMBERLEY, KRAL MRN:  [id number]  /  DOB:  1936-06-07  To whom it may concern:  Mrs. Mcquire is under my medical care for a pulmonary condition.  Her treatment requires home intravenous infusion of a medication each week on a chronic basis.   Sincerely,     Lien Lyman D. Maple Hudson, MD, FCCP, FACP   CDY/MedQ  DD: 04/28/2012  DT: 04/29/2012  Job #: [id number]

## 2012-05-01 ENCOUNTER — Encounter: Payer: Medicare Other | Admitting: Physical Therapy

## 2012-05-05 ENCOUNTER — Encounter: Payer: Medicare Other | Admitting: *Deleted

## 2012-05-06 ENCOUNTER — Encounter: Payer: Medicare Other | Admitting: Physical Therapy

## 2012-05-07 ENCOUNTER — Encounter: Payer: Medicare Other | Admitting: *Deleted

## 2012-05-21 ENCOUNTER — Other Ambulatory Visit: Payer: Self-pay | Admitting: Internal Medicine

## 2012-06-15 ENCOUNTER — Ambulatory Visit: Payer: Medicare Other | Admitting: Pulmonary Disease

## 2012-07-09 ENCOUNTER — Ambulatory Visit: Payer: Medicare Other | Attending: Orthopedic Surgery | Admitting: Physical Therapy

## 2012-07-09 DIAGNOSIS — M545 Low back pain, unspecified: Secondary | ICD-10-CM | POA: Insufficient documentation

## 2012-07-09 DIAGNOSIS — IMO0001 Reserved for inherently not codable concepts without codable children: Secondary | ICD-10-CM | POA: Insufficient documentation

## 2012-07-09 DIAGNOSIS — R5381 Other malaise: Secondary | ICD-10-CM | POA: Insufficient documentation

## 2012-07-13 ENCOUNTER — Ambulatory Visit: Payer: Medicare Other | Admitting: Physical Therapy

## 2012-07-16 ENCOUNTER — Encounter: Payer: Medicare Other | Admitting: *Deleted

## 2012-07-16 ENCOUNTER — Encounter: Payer: Medicare Other | Admitting: Physical Therapy

## 2012-07-21 ENCOUNTER — Ambulatory Visit: Payer: Medicare Other | Admitting: Physical Therapy

## 2012-07-21 ENCOUNTER — Ambulatory Visit: Payer: Medicare Other | Attending: Orthopedic Surgery | Admitting: Physical Therapy

## 2012-07-21 DIAGNOSIS — R5381 Other malaise: Secondary | ICD-10-CM | POA: Insufficient documentation

## 2012-07-21 DIAGNOSIS — M545 Low back pain, unspecified: Secondary | ICD-10-CM | POA: Insufficient documentation

## 2012-07-21 DIAGNOSIS — IMO0001 Reserved for inherently not codable concepts without codable children: Secondary | ICD-10-CM | POA: Insufficient documentation

## 2012-08-10 ENCOUNTER — Telehealth: Payer: Self-pay | Admitting: Cardiology

## 2012-08-10 NOTE — Telephone Encounter (Signed)
The patient is at acceptable risk for the planned procedure.  She had a nuclear study earlier this year without evidence for ischemia or infarct.

## 2012-08-10 NOTE — Telephone Encounter (Signed)
Will forward to Dr Hochrein for review  

## 2012-08-10 NOTE — Telephone Encounter (Signed)
New Problem:    Called in needing a medical clearance for surgery on her ankle sent into [facility] surgical coordinator with [facility] orthoped[facility] phone#- [phone] Fax [phone].

## 2012-08-11 NOTE — Telephone Encounter (Signed)
faxed

## 2012-08-15 ENCOUNTER — Other Ambulatory Visit: Payer: Self-pay | Admitting: Cardiology

## 2012-08-18 ENCOUNTER — Telehealth: Payer: Self-pay

## 2012-08-18 NOTE — Telephone Encounter (Signed)
..   Requested Prescriptions   Pending Prescriptions Disp Refills  . isosorbide mononitrate (IMDUR) 60 MG 24 hr tablet [Pharmacy Med Name: ISOSORB MONO 60MG  E TAB TORR] 30 tablet 6    Sig: TAKE ONE TABLET BY MOUTH ONE TIME DAILY

## 2012-08-18 NOTE — Telephone Encounter (Signed)
called to verfiy patients imdur rx which was sent in 08-18-2012

## 2012-08-19 ENCOUNTER — Encounter (HOSPITAL_COMMUNITY): Payer: Self-pay | Admitting: Pharmacy Technician

## 2012-08-19 ENCOUNTER — Other Ambulatory Visit: Payer: Self-pay | Admitting: Pulmonary Disease

## 2012-08-19 MED ORDER — FLUTICASONE PROPIONATE 50 MCG/ACT NA SUSP: 2.0000 | Freq: Every day | NASAL | Status: DC

## 2012-08-19 NOTE — Telephone Encounter (Signed)
Faxed refill request received from [facility], [place] for Fluticasone use two sprays each nostril daily. Patient last seen 08/08/11. Refill sent in to pharmacy.

## 2012-08-25 ENCOUNTER — Encounter (HOSPITAL_COMMUNITY): Payer: Self-pay

## 2012-08-25 ENCOUNTER — Encounter (HOSPITAL_COMMUNITY)
Admission: RE | Admit: 2012-08-25 | Discharge: 2012-08-25 | Disposition: A | Discharge status: Home / Self Care | Payer: Medicare Other | Source: Ambulatory Visit | Attending: Orthopedic Surgery | Admitting: Orthopedic Surgery

## 2012-08-25 HISTORY — DX: Unspecified chronic bronchitis: J42

## 2012-08-25 HISTORY — DX: Bacterial meningitis, unspecified: G00.9

## 2012-08-25 LAB — BASIC METABOLIC PANEL
BUN: 12 mg/dL (ref 6–23)
CO2: 29 mEq/L (ref 19–32)
Calcium: 9.5 mg/dL (ref 8.4–10.5)
Chloride: 94 mEq/L — ABNORMAL LOW (ref 96–112)
Creatinine, Ser: 0.53 mg/dL (ref 0.50–1.10)
GFR calc Af Amer: >90 mL/min (ref >90)
GFR calc non Af Amer: >90 mL/min (ref >90)
Glucose, Bld: 89 mg/dL (ref 70–99)
Potassium: 4.4 mEq/L (ref 3.5–5.1)
Sodium: 132 mEq/L — ABNORMAL LOW (ref 135–145)

## 2012-08-25 LAB — CBC
HCT: 35.3 % — ABNORMAL LOW (ref 36.0–46.0)
Hemoglobin: 13 g/dL (ref 12.0–15.0)
MCH: 34.8 pg — ABNORMAL HIGH (ref 26.0–34.0)
MCHC: 36.8 g/dL — ABNORMAL HIGH (ref 30.0–36.0)
MCV: 94.4 fL (ref 78.0–100.0)
Platelets: 247 10*3/uL (ref 150–400)
RBC: 3.74 MIL/uL — ABNORMAL LOW (ref 3.87–5.11)
RDW: 12.8 % (ref 11.5–15.5)
WBC: 6.7 10*3/uL (ref 4.0–10.5)

## 2012-08-25 LAB — SURGICAL PCR SCREEN
MRSA, PCR: NEGATIVE
Staphylococcus aureus: NEGATIVE

## 2012-08-25 NOTE — Consult Note (Signed)
Anesthesia Chart Review:  Patient is a 76 year old female scheduled for right ankle and subtalar arthrodesis on 08/27/12 by Dr. Victorino Dike.  She is s/p left knee arthroscopy on 03/26/12.  History included CAD s/p ramus stent '03, anxiety, arthritis, depression, HTN, hypothyroidism, asthmatic bronchitis, GERD, former smoker, post-operative N/V, possible bacterial meningitis (felt unlikely by ID) following right TKA '08, prior knee and back surgeries.  Pulmonologist is Dr. Vassie Loll.  Her PCP is Dr. Rudi Heap who cleared her for this procedure.  Her Cardiologist is Dr. Antoine Poche who also felt patient was at acceptable risk for this procedure (see 08/10/12 phone encounter).  EKG on 02/14/12 showed NSR with first degree AVB.  Patient had a normal nuclear stress test on 02/26/12, EF 72%.      Cardiac cath on 05/04/09 showed normal left main, proximal and mid LAD with 20-30% lesions, DIAG 1/2 small without critical disease.  Ramus branch widely patent with 20-30% in-stent restenosis,  CX was nondominant with 30-40% mid stenosis.  RCA was dominant and WNL.  EF 60%  No acute cardiopulmonary abnormalities on CXR from 03/20/12.  Labs noted.    If no new CV symptoms then anticipate she can proceed as planned.  Shonna Chock, PA-C

## 2012-08-25 NOTE — Progress Notes (Signed)
Left message with Sherri at [facility] requesting that orders be placed in computer.

## 2012-08-25 NOTE — Pre-Procedure Instructions (Signed)
20 [name]  08/25/2012   Your procedure is scheduled on:  September 12  Report to [facility] at 10:30 AM.  Call this number if you have problems the morning of surgery: [phone]   Remember:   Do not eat food:After Midnight.  May have clear liquids:until Midnight .  Clear liquids include soda, tea, black coffee, apple or grape juice, broth.  Take these medicines the morning of surgery with A SIP OF WATER: Acetaminophen, Albuterol, Flonase, Isosorbide, Levothyroxine, Metoprolol, Zegerid, Zofran, Forteo   Stop Vitamin C, Aspirin, Calcium, Vitamin D, Multiple Vitamins, Lovaza, Vitamin E today  Do not wear jewelry, make-up or nail polish.  Do not wear lotions, powders, or perfumes. You may wear deodorant.  Do not shave 48 hours prior to surgery. Men may shave face and neck.  Do not bring valuables to the hospital.  Contacts, dentures or bridgework may not be worn into surgery.  Leave suitcase in the car. After surgery it may be brought to your room.  For patients admitted to the hospital, checkout time is 11:00 AM the day of discharge.   Special Instructions: CHG Shower Use Special Wash: 1/2 bottle night before surgery and 1/2 bottle morning of surgery.   Please read over the following fact sheets that you were given: Pain Booklet, Coughing and Deep Breathing and Surgical Site Infection Prevention

## 2012-08-26 MED ORDER — VANCOMYCIN HCL IN DEXTROSE 1-5 GM/200ML-% IV SOLN
1000.0000 mg | INTRAVENOUS | Status: AC
  Administered 2012-08-27: 1000 mg via INTRAVENOUS
  Filled 2012-08-26: qty 200

## 2012-08-27 ENCOUNTER — Ambulatory Visit (HOSPITAL_COMMUNITY): Payer: Medicare Other | Admitting: Vascular Surgery

## 2012-08-27 ENCOUNTER — Encounter (HOSPITAL_COMMUNITY): Payer: Self-pay | Admitting: Surgery

## 2012-08-27 ENCOUNTER — Ambulatory Visit (HOSPITAL_COMMUNITY): Payer: Medicare Other

## 2012-08-27 ENCOUNTER — Encounter (HOSPITAL_COMMUNITY): Payer: Self-pay | Admitting: Vascular Surgery

## 2012-08-27 ENCOUNTER — Encounter (HOSPITAL_COMMUNITY)
Admission: RE | Disposition: A | Discharge status: Skilled Nursing Facility | Payer: Self-pay | Source: Ambulatory Visit | Attending: Orthopedic Surgery

## 2012-08-27 ENCOUNTER — Inpatient Hospital Stay (HOSPITAL_COMMUNITY)
Admission: RE | Admit: 2012-08-27 | Discharge: 2012-08-31 | DRG: 497 | Disposition: A | Discharge status: Skilled Nursing Facility | Payer: Medicare Other | Source: Ambulatory Visit | Attending: Orthopedic Surgery | Admitting: Orthopedic Surgery

## 2012-08-27 DIAGNOSIS — E039 Hypothyroidism, unspecified: Secondary | ICD-10-CM | POA: Diagnosis present

## 2012-08-27 DIAGNOSIS — K219 Gastro-esophageal reflux disease without esophagitis: Secondary | ICD-10-CM | POA: Diagnosis present

## 2012-08-27 DIAGNOSIS — Z9861 Coronary angioplasty status: Secondary | ICD-10-CM

## 2012-08-27 DIAGNOSIS — Z96659 Presence of unspecified artificial knee joint: Secondary | ICD-10-CM

## 2012-08-27 DIAGNOSIS — M12579 Traumatic arthropathy, unspecified ankle and foot: Principal | ICD-10-CM | POA: Diagnosis present

## 2012-08-27 DIAGNOSIS — Z7982 Long term (current) use of aspirin: Secondary | ICD-10-CM

## 2012-08-27 DIAGNOSIS — I2510 Atherosclerotic heart disease of native coronary artery without angina pectoris: Secondary | ICD-10-CM | POA: Diagnosis present

## 2012-08-27 DIAGNOSIS — F3289 Other specified depressive episodes: Secondary | ICD-10-CM | POA: Diagnosis present

## 2012-08-27 DIAGNOSIS — F411 Generalized anxiety disorder: Secondary | ICD-10-CM | POA: Diagnosis present

## 2012-08-27 DIAGNOSIS — Z79899 Other long term (current) drug therapy: Secondary | ICD-10-CM

## 2012-08-27 DIAGNOSIS — I252 Old myocardial infarction: Secondary | ICD-10-CM

## 2012-08-27 DIAGNOSIS — I10 Essential (primary) hypertension: Secondary | ICD-10-CM | POA: Diagnosis present

## 2012-08-27 DIAGNOSIS — M19079 Primary osteoarthritis, unspecified ankle and foot: Secondary | ICD-10-CM

## 2012-08-27 DIAGNOSIS — J45909 Unspecified asthma, uncomplicated: Secondary | ICD-10-CM | POA: Diagnosis present

## 2012-08-27 DIAGNOSIS — Z23 Encounter for immunization: Secondary | ICD-10-CM

## 2012-08-27 DIAGNOSIS — F329 Major depressive disorder, single episode, unspecified: Secondary | ICD-10-CM | POA: Diagnosis present

## 2012-08-27 DIAGNOSIS — Z01812 Encounter for preprocedural laboratory examination: Secondary | ICD-10-CM

## 2012-08-27 DIAGNOSIS — Z87891 Personal history of nicotine dependence: Secondary | ICD-10-CM

## 2012-08-27 HISTORY — PX: ANKLE FUSION: SHX881

## 2012-08-27 SURGERY — ARTHRODESIS ANKLE
Anesthesia: General | Site: Ankle | Laterality: Right | Wound class: Clean

## 2012-08-27 MED ORDER — FLUTICASONE PROPIONATE 50 MCG/ACT NA SUSP
2.0000 | Freq: Every day | NASAL | Status: DC
  Administered 2012-08-28 – 2012-08-31 (×3): 2 via NASAL
  Filled 2012-08-27: qty 16

## 2012-08-27 MED ORDER — ATORVASTATIN CALCIUM 40 MG PO TABS
40.0000 mg | ORAL_TABLET | Freq: Every day | ORAL | Status: DC
  Administered 2012-08-27 – 2012-08-30 (×4): 40 mg via ORAL
  Filled 2012-08-27 (×6): qty 1

## 2012-08-27 MED ORDER — HYDROMORPHONE HCL PF 1 MG/ML IJ SOLN: 0.2500 mg | INTRAMUSCULAR | Status: DC | PRN

## 2012-08-27 MED ORDER — DOCUSATE SODIUM 100 MG PO CAPS
100.0000 mg | ORAL_CAPSULE | Freq: Two times a day (BID) | ORAL | Status: DC
  Administered 2012-08-27 – 2012-08-31 (×8): 100 mg via ORAL
  Filled 2012-08-27 (×8): qty 1

## 2012-08-27 MED ORDER — LACTATED RINGERS IV SOLN
INTRAVENOUS | Status: DC | PRN
  Administered 2012-08-27 (×2): via INTRAVENOUS

## 2012-08-27 MED ORDER — ALPRAZOLAM 0.5 MG PO TABS
1.0000 mg | ORAL_TABLET | Freq: Every evening | ORAL | Status: DC | PRN
  Administered 2012-08-27 – 2012-08-30 (×4): 1 mg via ORAL
  Filled 2012-08-27: qty 2
  Filled 2012-08-27: qty 1
  Filled 2012-08-27: qty 2
  Filled 2012-08-27 (×2): qty 1
  Filled 2012-08-27: qty 2

## 2012-08-27 MED ORDER — MIDAZOLAM HCL 2 MG/2ML IJ SOLN
INTRAMUSCULAR | Status: AC
  Filled 2012-08-27: qty 2

## 2012-08-27 MED ORDER — METOPROLOL TARTRATE 50 MG PO TABS
50.0000 mg | ORAL_TABLET | Freq: Two times a day (BID) | ORAL | Status: DC
  Administered 2012-08-27 – 2012-08-31 (×8): 50 mg via ORAL
  Filled 2012-08-27 (×10): qty 1

## 2012-08-27 MED ORDER — SCOPOLAMINE 1 MG/3DAYS TD PT72
1.0000 | MEDICATED_PATCH | Freq: Once | TRANSDERMAL | Status: DC
  Filled 2012-08-27: qty 1

## 2012-08-27 MED ORDER — HYDROMORPHONE HCL PF 1 MG/ML IJ SOLN
0.5000 mg | INTRAMUSCULAR | Status: DC | PRN
  Filled 2012-08-27: qty 1

## 2012-08-27 MED ORDER — CALCIUM CARBONATE-VITAMIN D 500-200 MG-UNIT PO TABS
2.0000 | ORAL_TABLET | Freq: Every day | ORAL | Status: DC
  Administered 2012-08-27 – 2012-08-30 (×4): 2 via ORAL
  Filled 2012-08-27 (×6): qty 2

## 2012-08-27 MED ORDER — ACETAMINOPHEN 325 MG PO TABS
650.0000 mg | ORAL_TABLET | Freq: Four times a day (QID) | ORAL | Status: DC | PRN
  Administered 2012-08-29: 650 mg via ORAL
  Filled 2012-08-27: qty 2

## 2012-08-27 MED ORDER — METOCLOPRAMIDE HCL 10 MG PO TABS: 5.0000 mg | ORAL_TABLET | Freq: Three times a day (TID) | ORAL | Status: DC | PRN

## 2012-08-27 MED ORDER — ONDANSETRON HCL 4 MG/2ML IJ SOLN: 4.0000 mg | Freq: Four times a day (QID) | INTRAMUSCULAR | Status: DC | PRN

## 2012-08-27 MED ORDER — ADULT MULTIVITAMIN W/MINERALS CH
1.0000 | ORAL_TABLET | Freq: Every day | ORAL | Status: DC
  Administered 2012-08-28 – 2012-08-31 (×4): 1 via ORAL
  Filled 2012-08-27 (×4): qty 1

## 2012-08-27 MED ORDER — VANCOMYCIN HCL IN DEXTROSE 1-5 GM/200ML-% IV SOLN
1000.0000 mg | Freq: Two times a day (BID) | INTRAVENOUS | Status: AC
  Administered 2012-08-28: 1000 mg via INTRAVENOUS
  Filled 2012-08-27: qty 200

## 2012-08-27 MED ORDER — MONTELUKAST SODIUM 10 MG PO TABS
10.0000 mg | ORAL_TABLET | Freq: Every day | ORAL | Status: DC
  Administered 2012-08-27 – 2012-08-30 (×4): 10 mg via ORAL
  Filled 2012-08-27 (×5): qty 1

## 2012-08-27 MED ORDER — MUPIROCIN CALCIUM 2 % EX CREA
TOPICAL_CREAM | CUTANEOUS | Status: AC
  Filled 2012-08-27: qty 15

## 2012-08-27 MED ORDER — CHLORHEXIDINE GLUCONATE 4 % EX LIQD: 60.0000 mL | Freq: Once | CUTANEOUS | Status: DC

## 2012-08-27 MED ORDER — SERTRALINE HCL 100 MG PO TABS
100.0000 mg | ORAL_TABLET | Freq: Every day | ORAL | Status: DC
  Administered 2012-08-27 – 2012-08-30 (×4): 100 mg via ORAL
  Filled 2012-08-27 (×6): qty 1

## 2012-08-27 MED ORDER — FENTANYL CITRATE 0.05 MG/ML IJ SOLN: 50.0000 ug | Freq: Once | INTRAMUSCULAR | Status: DC

## 2012-08-27 MED ORDER — OMEGA-3-ACID ETHYL ESTERS 1 G PO CAPS
1.0000 g | ORAL_CAPSULE | Freq: Two times a day (BID) | ORAL | Status: DC
  Administered 2012-08-27 – 2012-08-31 (×8): 1 g via ORAL
  Filled 2012-08-27 (×10): qty 1

## 2012-08-27 MED ORDER — ONDANSETRON HCL 4 MG/2ML IJ SOLN
INTRAMUSCULAR | Status: DC | PRN
  Administered 2012-08-27: 4 mg via INTRAVENOUS

## 2012-08-27 MED ORDER — DEXAMETHASONE SODIUM PHOSPHATE 4 MG/ML IJ SOLN
INTRAMUSCULAR | Status: DC | PRN
  Administered 2012-08-27: 4 mg

## 2012-08-27 MED ORDER — PROMETHAZINE HCL 25 MG/ML IJ SOLN: 6.2500 mg | INTRAMUSCULAR | Status: DC | PRN

## 2012-08-27 MED ORDER — METOCLOPRAMIDE HCL 5 MG/ML IJ SOLN: 5.0000 mg | Freq: Three times a day (TID) | INTRAMUSCULAR | Status: DC | PRN

## 2012-08-27 MED ORDER — CALCIUM CARBONATE-VITAMIN D 600-400 MG-UNIT PO TABS: 2.0000 | ORAL_TABLET | Freq: Every day | ORAL | Status: DC

## 2012-08-27 MED ORDER — PROPOFOL 10 MG/ML IV BOLUS
INTRAVENOUS | Status: DC | PRN
  Administered 2012-08-27 (×2): 20 mg via INTRAVENOUS
  Administered 2012-08-27: 150 mg via INTRAVENOUS

## 2012-08-27 MED ORDER — 0.9 % SODIUM CHLORIDE (POUR BTL) OPTIME
TOPICAL | Status: DC | PRN
  Administered 2012-08-27: 1000 mL

## 2012-08-27 MED ORDER — OXYCODONE HCL 5 MG PO TABS
5.0000 mg | ORAL_TABLET | ORAL | Status: DC | PRN
  Administered 2012-08-27 – 2012-08-28 (×4): 10 mg via ORAL
  Administered 2012-08-29 – 2012-08-30 (×5): 5 mg via ORAL
  Administered 2012-08-31: 10 mg via ORAL
  Administered 2012-08-31: 5 mg via ORAL
  Filled 2012-08-27: qty 2
  Filled 2012-08-27: qty 1
  Filled 2012-08-27 (×3): qty 2
  Filled 2012-08-27 (×2): qty 1
  Filled 2012-08-27: qty 2
  Filled 2012-08-27 (×3): qty 1

## 2012-08-27 MED ORDER — MIDAZOLAM HCL 2 MG/2ML IJ SOLN: 1.0000 mg | INTRAMUSCULAR | Status: DC | PRN

## 2012-08-27 MED ORDER — SENNA 8.6 MG PO TABS
1.0000 | ORAL_TABLET | Freq: Two times a day (BID) | ORAL | Status: DC
  Administered 2012-08-27 – 2012-08-31 (×8): 8.6 mg via ORAL
  Filled 2012-08-27 (×10): qty 1

## 2012-08-27 MED ORDER — PNEUMOCOCCAL VAC POLYVALENT 25 MCG/0.5ML IJ INJ
0.5000 mL | INJECTION | INTRAMUSCULAR | Status: AC
  Administered 2012-08-28: 0.5 mL via INTRAMUSCULAR
  Filled 2012-08-27: qty 0.5

## 2012-08-27 MED ORDER — FENTANYL CITRATE 0.05 MG/ML IJ SOLN
INTRAMUSCULAR | Status: AC
  Filled 2012-08-27: qty 2

## 2012-08-27 MED ORDER — OMEPRAZOLE 20 MG PO CPDR
40.0000 mg | DELAYED_RELEASE_CAPSULE | Freq: Every day | ORAL | Status: DC
  Administered 2012-08-28 – 2012-08-31 (×4): 40 mg via ORAL
  Filled 2012-08-27 (×5): qty 2

## 2012-08-27 MED ORDER — TERIPARATIDE (RECOMBINANT) 600 MCG/2.4ML ~~LOC~~ SOLN
20.0000 ug | Freq: Every day | SUBCUTANEOUS | Status: DC
  Administered 2012-08-28 – 2012-08-31 (×4): 20 ug via SUBCUTANEOUS
  Filled 2012-08-27: qty 0.08

## 2012-08-27 MED ORDER — FENTANYL CITRATE 0.05 MG/ML IJ SOLN
INTRAMUSCULAR | Status: DC | PRN
  Administered 2012-08-27 (×4): 25 ug via INTRAVENOUS

## 2012-08-27 MED ORDER — NITROGLYCERIN 0.4 MG SL SUBL: 0.4000 mg | SUBLINGUAL_TABLET | SUBLINGUAL | Status: DC | PRN

## 2012-08-27 MED ORDER — DM-GUAIFENESIN ER 30-600 MG PO TB12
1.0000 | ORAL_TABLET | Freq: Two times a day (BID) | ORAL | Status: DC
  Administered 2012-08-27: 1 via ORAL
  Administered 2012-08-28 – 2012-08-29 (×3): via ORAL
  Administered 2012-08-30 – 2012-08-31 (×4): 1 via ORAL
  Filled 2012-08-27 (×11): qty 1

## 2012-08-27 MED ORDER — LACTATED RINGERS IV SOLN
INTRAVENOUS | Status: DC
  Administered 2012-08-27 – 2012-08-28 (×2): via INTRAVENOUS

## 2012-08-27 MED ORDER — SODIUM CHLORIDE 0.9 % IV SOLN: INTRAVENOUS | Status: DC

## 2012-08-27 MED ORDER — ALBUTEROL SULFATE (5 MG/ML) 0.5% IN NEBU
2.5000 mg | INHALATION_SOLUTION | Freq: Four times a day (QID) | RESPIRATORY_TRACT | Status: DC | PRN
  Filled 2012-08-27: qty 0.5

## 2012-08-27 MED ORDER — BUPIVACAINE-EPINEPHRINE PF 0.5-1:200000 % IJ SOLN
INTRAMUSCULAR | Status: DC | PRN
  Administered 2012-08-27: 50 mL

## 2012-08-27 MED ORDER — DICYCLOMINE HCL 10 MG PO CAPS
10.0000 mg | ORAL_CAPSULE | Freq: Four times a day (QID) | ORAL | Status: DC | PRN
Start: 2012-08-27 — End: 2012-08-31
  Administered 2012-08-28: 20 mg via ORAL
  Filled 2012-08-27: qty 2

## 2012-08-27 MED ORDER — LEVOTHYROXINE SODIUM 50 MCG PO TABS
50.0000 ug | ORAL_TABLET | Freq: Every day | ORAL | Status: DC
  Administered 2012-08-28 – 2012-08-31 (×4): 50 ug via ORAL
  Filled 2012-08-27 (×5): qty 1

## 2012-08-27 MED ORDER — ONDANSETRON HCL 4 MG PO TABS
4.0000 mg | ORAL_TABLET | Freq: Four times a day (QID) | ORAL | Status: DC | PRN
  Administered 2012-08-30: 4 mg via ORAL
  Filled 2012-08-27: qty 1

## 2012-08-27 MED ORDER — ISOSORBIDE MONONITRATE ER 60 MG PO TB24
60.0000 mg | ORAL_TABLET | Freq: Every day | ORAL | Status: DC
  Administered 2012-08-28 – 2012-08-31 (×4): 60 mg via ORAL
  Filled 2012-08-27 (×4): qty 1

## 2012-08-27 MED ORDER — MIDAZOLAM HCL 2 MG/2ML IJ SOLN
1.0000 mg | INTRAMUSCULAR | Status: DC | PRN
  Administered 2012-08-27: 2 mg via INTRAVENOUS

## 2012-08-27 MED ORDER — INFLUENZA VIRUS VACC SPLIT PF IM SUSP
0.5000 mL | INTRAMUSCULAR | Status: AC
  Administered 2012-08-28: 0.5 mL via INTRAMUSCULAR
  Filled 2012-08-27: qty 0.5

## 2012-08-27 MED ORDER — FENTANYL CITRATE 0.05 MG/ML IJ SOLN
50.0000 ug | Freq: Once | INTRAMUSCULAR | Status: AC
  Administered 2012-08-27: 100 ug via INTRAVENOUS

## 2012-08-27 MED ORDER — LIDOCAINE HCL (CARDIAC) 20 MG/ML IV SOLN
INTRAVENOUS | Status: DC | PRN
  Administered 2012-08-27 (×2): 50 mg via INTRAVENOUS

## 2012-08-27 MED ORDER — LORATADINE 10 MG PO TABS
10.0000 mg | ORAL_TABLET | Freq: Every day | ORAL | Status: DC
  Administered 2012-08-28 – 2012-08-31 (×4): 10 mg via ORAL
  Filled 2012-08-27 (×5): qty 1

## 2012-08-27 MED ORDER — ENOXAPARIN SODIUM 40 MG/0.4ML ~~LOC~~ SOLN
40.0000 mg | SUBCUTANEOUS | Status: DC
  Administered 2012-08-28 – 2012-08-31 (×4): 40 mg via SUBCUTANEOUS
  Filled 2012-08-27 (×5): qty 0.4

## 2012-08-27 MED ORDER — ALBUTEROL SULFATE HFA 108 (90 BASE) MCG/ACT IN AERS
2.0000 | INHALATION_SPRAY | Freq: Four times a day (QID) | RESPIRATORY_TRACT | Status: DC | PRN
  Administered 2012-08-29: 2 via RESPIRATORY_TRACT
  Filled 2012-08-27 (×2): qty 6.7

## 2012-08-27 MED ORDER — PANTOPRAZOLE SODIUM 40 MG PO TBEC: 80.0000 mg | DELAYED_RELEASE_TABLET | Freq: Every day | ORAL | Status: DC

## 2012-08-27 MED ORDER — VITAMIN D3 25 MCG (1000 UNIT) PO TABS
1000.0000 [IU] | ORAL_TABLET | Freq: Two times a day (BID) | ORAL | Status: DC
  Administered 2012-08-28 – 2012-08-31 (×7): 1000 [IU] via ORAL
  Filled 2012-08-27 (×9): qty 1

## 2012-08-27 SURGICAL SUPPLY — 72 items
BANDAGE ESMARK 6X9 LF (GAUZE/BANDAGES/DRESSINGS) ×1 IMPLANT
BIT DRILL 4.8MMDIAX5IN DISPOSE (BIT) IMPLANT
BIT DRILL CALIBRATED 4.3X320MM (BIT) IMPLANT
BIT DRILL CANN 7X200 (BIT) ×1 IMPLANT
BLADE SAW SAG HD 89.5X25X.94 (BLADE) ×1 IMPLANT
BLADE SAW SGTL HD 18.5X60.5X1. (BLADE) ×2 IMPLANT
BLADE SURG 10 STRL SS (BLADE) ×2 IMPLANT
BNDG CMPR 9X6 STRL LF SNTH (GAUZE/BANDAGES/DRESSINGS) ×1
BNDG COHESIVE 4X5 TAN STRL (GAUZE/BANDAGES/DRESSINGS) ×2 IMPLANT
BNDG COHESIVE 6X5 TAN STRL LF (GAUZE/BANDAGES/DRESSINGS) ×2 IMPLANT
BNDG ESMARK 6X9 LF (GAUZE/BANDAGES/DRESSINGS) ×2
CHLORAPREP W/TINT 10.5 ML (MISCELLANEOUS) ×2 IMPLANT
CHLORAPREP W/TINT 26ML (MISCELLANEOUS) ×2 IMPLANT
CLOTH BEACON ORANGE TIMEOUT ST (SAFETY) ×2 IMPLANT
COVER SURGICAL LIGHT HANDLE (MISCELLANEOUS) ×2 IMPLANT
CUFF TOURNIQUET SINGLE 34IN LL (TOURNIQUET CUFF) ×2 IMPLANT
CUFF TOURNIQUET SINGLE 44IN (TOURNIQUET CUFF) IMPLANT
DRAPE C-ARM 42X72 X-RAY (DRAPES) ×2 IMPLANT
DRAPE C-ARMOR (DRAPES) ×2 IMPLANT
DRAPE INCISE IOBAN 66X45 STRL (DRAPES) ×2 IMPLANT
DRAPE U-SHAPE 47X51 STRL (DRAPES) ×2 IMPLANT
DRILL BIT 4.8MMDIAX5IN DISPOSE (BIT) ×2
DRILL CALIBRATED 4.3X320MM (BIT) ×2
DRSG ADAPTIC 3X8 NADH LF (GAUZE/BANDAGES/DRESSINGS) ×1 IMPLANT
DRSG PAD ABDOMINAL 8X10 ST (GAUZE/BANDAGES/DRESSINGS) ×2 IMPLANT
DRSG TEGADERM 4X4.75 (GAUZE/BANDAGES/DRESSINGS) ×2 IMPLANT
ELECT REM PT RETURN 9FT ADLT (ELECTROSURGICAL) ×2
ELECTRODE REM PT RTRN 9FT ADLT (ELECTROSURGICAL) ×1 IMPLANT
GAUZE SPONGE 2X2 8PLY STRL LF (GAUZE/BANDAGES/DRESSINGS) ×1 IMPLANT
GLOVE BIO SURGEON STRL SZ8 (GLOVE) ×3 IMPLANT
GLOVE BIOGEL M 6.5 STRL (GLOVE) ×1 IMPLANT
GLOVE BIOGEL PI IND STRL 6.5 (GLOVE) IMPLANT
GLOVE BIOGEL PI IND STRL 8 (GLOVE) ×1 IMPLANT
GLOVE BIOGEL PI INDICATOR 6.5 (GLOVE) ×1
GLOVE BIOGEL PI INDICATOR 8 (GLOVE) ×1
GOWN PREVENTION PLUS XLARGE (GOWN DISPOSABLE) ×2 IMPLANT
GOWN STRL NON-REIN LRG LVL3 (GOWN DISPOSABLE) ×4 IMPLANT
GUIDEWIRE BEAD TIP (WIRE) ×1 IMPLANT
KIT BASIN OR (CUSTOM PROCEDURE TRAY) ×2 IMPLANT
KIT ROOM TURNOVER OR (KITS) ×2 IMPLANT
MANIFOLD NEPTUNE II (INSTRUMENTS) ×2 IMPLANT
NAIL LOCK ANKLE ANTE 11X180 (Nail) ×1 IMPLANT
NEEDLE 22X1 1/2 (OR ONLY) (NEEDLE) IMPLANT
NS IRRIG 1000ML POUR BTL (IV SOLUTION) ×2 IMPLANT
PACK ORTHO EXTREMITY (CUSTOM PROCEDURE TRAY) ×2 IMPLANT
PAD ARMBOARD 7.5X6 YLW CONV (MISCELLANEOUS) ×4 IMPLANT
PAD CAST 4YDX4 CTTN HI CHSV (CAST SUPPLIES) ×1 IMPLANT
PADDING CAST COTTON 4X4 STRL (CAST SUPPLIES) ×2
PADDING CAST COTTON 6X4 STRL (CAST SUPPLIES) ×1 IMPLANT
SCREW CORT TI DBL LEAD 5X24 (Screw) ×2 IMPLANT
SCREW CORT TI DBL LEAD 5X40 (Screw) ×1 IMPLANT
SCREW CORT TI DBL LEAD 5X65 (Screw) ×1 IMPLANT
SOAP 2 % CHG 4 OZ (WOUND CARE) ×2 IMPLANT
SPONGE GAUZE 2X2 STER 10/PKG (GAUZE/BANDAGES/DRESSINGS) ×1
SPONGE GAUZE 4X4 12PLY (GAUZE/BANDAGES/DRESSINGS) ×1 IMPLANT
SPONGE LAP 18X18 X RAY DECT (DISPOSABLE) ×2 IMPLANT
STAPLER VISISTAT 35W (STAPLE) ×1 IMPLANT
STRIP CLOSURE SKIN 1/2X4 (GAUZE/BANDAGES/DRESSINGS) ×2 IMPLANT
SUCTION FRAZIER TIP 10 FR DISP (SUCTIONS) ×2 IMPLANT
SUT MNCRL AB 3-0 PS2 18 (SUTURE) ×1 IMPLANT
SUT PROLENE 3 0 PS 2 (SUTURE) ×2 IMPLANT
SUT VIC AB 0 CT1 27 (SUTURE) ×2
SUT VIC AB 0 CT1 27XBRD ANBCTR (SUTURE) IMPLANT
SUT VIC AB 2-0 CT1 27 (SUTURE) ×2
SUT VIC AB 2-0 CT1 TAPERPNT 27 (SUTURE) ×2 IMPLANT
SUT VIC AB 3-0 PS2 18 (SUTURE) ×2
SUT VIC AB 3-0 PS2 18XBRD (SUTURE) ×1 IMPLANT
SYR CONTROL 10ML LL (SYRINGE) IMPLANT
TOWEL OR 17X24 6PK STRL BLUE (TOWEL DISPOSABLE) ×2 IMPLANT
TOWEL OR 17X26 10 PK STRL BLUE (TOWEL DISPOSABLE) ×3 IMPLANT
TUBE CONNECTING 12X1/4 (SUCTIONS) ×2 IMPLANT
WATER STERILE IRR 1000ML POUR (IV SOLUTION) ×2 IMPLANT

## 2012-08-27 NOTE — Brief Op Note (Signed)
08/27/2012  3:51 PM  PATIENT:  [name]  76 y.o. female  PRE-OPERATIVE DIAGNOSIS:   1.  Right ankle arthritis 2.  Right subtalar joint arthritis  POST-OPERATIVE DIAGNOSIS:   Same  Procedure(s): 1.  Right ankle arthrodesis 2.  Right subtalar arthrodesis 3.  Bone graft from fibula to ankle and subtalar joints 4.  fluoro > 1 hour  SURGEON:  [name], MD   ASSISTANT: n/a  ANESTHESIA:   General, regional  EBL:  minimal   TOURNIQUET:   Total Tourniquet Time Documented: Thigh (Right) - 120 minutes  COMPLICATIONS:  None apparent  DISPOSITION:  Extubated, awake and stable to recovery.  DICTATION ID:  [id number]

## 2012-08-27 NOTE — Anesthesia Postprocedure Evaluation (Signed)
  Anesthesia Post-op Note  Patient: [name]  Procedure(s) Performed: Procedure(s) (LRB): ARTHRODESIS ANKLE (Right)  Patient Location: PACU  Anesthesia Type: General  Level of Consciousness: awake and alert   Airway and Oxygen Therapy: Patient Spontanous Breathing  Post-op Pain: mild  Post-op Assessment: Post-op Vital signs reviewed, Patient's Cardiovascular Status Stable, Respiratory Function Stable, Patent Airway and No signs of Nausea or vomiting  Post-op Vital Signs: stable  Complications: No apparent anesthesia complications

## 2012-08-27 NOTE — Anesthesia Preprocedure Evaluation (Addendum)
Anesthesia Evaluation  Patient identified by MRN, date of birth, ID band Patient awake    History of Anesthesia Complications (+) PONV  Airway Mallampati: I TM Distance: >3 FB Neck ROM: Full    Dental  (+) Partial Upper, Dental Advisory Given and Teeth Intact   Pulmonary asthma (regular  nebulizer use) ,  breath sounds clear to auscultation  Pulmonary exam normal       Cardiovascular hypertension, + CAD and + Past MI (2002; stent) + dysrhythmias Rhythm:Regular Rate:Normal     Neuro/Psych Depression  Neuromuscular disease    GI/Hepatic GERD-  Medicated and Controlled,  Endo/Other  Hypothyroidism   Renal/GU      Musculoskeletal   Abdominal   Peds  Hematology   Anesthesia Other Findings   Reproductive/Obstetrics                          Anesthesia Physical Anesthesia Plan  ASA: III  Anesthesia Plan: General   Post-op Pain Management:    Induction: Intravenous  Airway Management Planned: Oral ETT  Additional Equipment:   Intra-op Plan:   Post-operative Plan: Extubation in OR  Informed Consent: I have reviewed the patients History and Physical, chart, labs and discussed the procedure including the risks, benefits and alternatives for the proposed anesthesia with the patient or authorized representative who has indicated his/her understanding and acceptance.     Plan Discussed with: CRNA and Surgeon  Anesthesia Plan Comments:         Anesthesia Quick Evaluation

## 2012-08-27 NOTE — Transfer of Care (Signed)
Immediate Anesthesia Transfer of Care Note  Patient: [name]  Procedure(s) Performed: Procedure(s) (LRB) with comments: ARTHRODESIS ANKLE (Right) - Arthrodesis right ankle and subtalar joint  Patient Location: PACU  Anesthesia Type: General  Level of Consciousness: awake, alert  and oriented  Airway & Oxygen Therapy: Patient Spontanous Breathing and Patient connected to face mask oxygen  Post-op Assessment: Report given to PACU RN and Post -op Vital signs reviewed and stable  Post vital signs: Reviewed  Complications: No apparent anesthesia complications

## 2012-08-27 NOTE — Preoperative (Signed)
Beta Blockers   Reason not to administer Beta Blockers:Not Applicable 

## 2012-08-27 NOTE — H&P (Signed)
[name] is an 76 y.o. female.   Chief Complaint: right ankle pain HPI: 76 y/o female with PMH significant for open right ankle fracture in the remote past now has symptoms of severe, debilitating right ankle and subtalar joint arthritis.  She has failed treatment with activity modification, oral pain meds, PT and bracing.  She presents now for right ankle and subtalar arthrodesis.  Past Medical History  Diagnosis Date  . GERD (gastroesophageal reflux disease)   . Anxiety disorder   . Arthritis   . CAD (coronary artery disease)     Cath May 2010.  Nonbstructive  . Depression   . Hypothyroid   . Asthmatic bronchitis   . Hypertension     dr Antoine Poche  . OA (osteoarthritis)   . Chronic bronchitis   . 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 Encephalitis     d/t meningitis  . PONV (postoperative nausea and vomiting)     history of cardiac arrest day 1 post surgery  in 2008    Past Surgical History  Procedure Date  . Total knee arthroplasty     right knee  . Back surgery   . Coronary angioplasty with stent placement 2003  . Sinus surgery with instatrak   . Knee arthroscopy 03/26/2012    Procedure: ARTHROSCOPY KNEE;  Surgeon: [name], MD;  Location: [facility] OR;  Service: Orthopedics;  Laterality: Left;  with Debridement of Lateral Meniscus tear  . Rotator cuff repair     bilateral    Family History  Problem Relation Age of Onset  . Prostate cancer Brother   . Heart disease Mother   . Emphysema Sister   . Asthma Mother    Social History:  reports that she quit smoking about 22 years ago. Her smoking use included Cigarettes. She has a 15 pack-year smoking history. She has never used smokeless tobacco. She reports that she does not drink alcohol or use illicit drugs.  Allergies:  Allergies  Allergen Reactions  . Aspirin Other (See Comments)    REACTION: regular strength causes "heart to beat fast"  . Captopril Hypertension  . Naproxen Other (See  Comments)    Tongue swelling  . Pantoprazole Sodium   . Penicillins     Swelling around site  . Sulfonamide Derivatives Hives    Medications Prior to Admission  Medication Sig Dispense Refill  . acetaminophen (TYLENOL) 325 MG tablet Take 650 mg by mouth every 6 (six) hours as needed. For pain/fever      . albuterol (PROVENTIL HFA;VENTOLIN HFA) 108 (90 BASE) MCG/ACT inhaler Inhale 2 puffs into the lungs every 6 (six) hours as needed. For shortness of breath      . albuterol (PROVENTIL) (2.5 MG/3ML) 0.083% nebulizer solution Take 2.5 mg by nebulization 3 (three) times daily as needed. For breathing      . ALPRAZolam (XANAX) 0.5 MG tablet Take 1 mg by mouth at bedtime as needed. For sleep      . Ascorbic Acid (VITAMIN C PO) Take 1 tablet by mouth every morning.      Marland Kitchen aspirin 81 MG chewable tablet Chew 81 mg by mouth daily.       . Calcium Carbonate-Vitamin D (CALCIUM 600+D) 600-400 MG-UNIT per tablet Take 1 tablet by mouth daily at 6 PM.       . cholecalciferol (VITAMIN D) 1000 UNITS tablet Take 1,000 Units by mouth 2 (two) times daily with breakfast and lunch.       Marland Kitchen  cyclobenzaprine (FLEXERIL) 10 MG tablet Take 10 mg by mouth 2 (two) times daily as needed. FOR PAIN      . dextromethorphan-guaiFENesin (MUCINEX DM) 30-600 MG per 12 hr tablet Take 1 tablet by mouth every 12 (twelve) hours. scheduled      . dicyclomine (BENTYL) 10 MG capsule Take 10-20 mg by mouth every 6 (six) hours as needed. For spasms      . fluticasone (FLONASE) 50 MCG/ACT nasal spray Place 2 sprays into the nose daily.  16 g  0  . isosorbide mononitrate (IMDUR) 60 MG 24 hr tablet Take 60 mg by mouth daily.      Marland Kitchen levothyroxine (SYNTHROID, LEVOTHROID) 25 MCG tablet Take 50 mcg by mouth daily.       Marland Kitchen loratadine (CLARITIN) 10 MG tablet Take 10 mg by mouth every morning.      . metoprolol (LOPRESSOR) 50 MG tablet Take 50 mg by mouth 2 (two) times daily.       . montelukast (SINGULAIR) 10 MG tablet Take 1 tablet (10 mg total)  by mouth at bedtime.  30 tablet  6  . Multiple Vitamin (MULTIVITAMIN WITH MINERALS) TABS Take 1 tablet by mouth daily.      . nitroGLYCERIN (NITROSTAT) 0.4 MG SL tablet Place 0.4 mg under the tongue every 5 (five) minutes as needed. For chest pain      . omega-3 acid ethyl esters (LOVAZA) 1 G capsule Take 1 g by mouth 2 (two) times daily.       Marland Kitchen omeprazole-sodium bicarbonate (ZEGERID) 40-1100 MG per capsule Take 1 capsule by mouth daily before breakfast.      . ondansetron (ZOFRAN) 4 MG tablet Take 4 mg by mouth every 12 (twelve) hours as needed. For nausea      . rosuvastatin (CRESTOR) 20 MG tablet Take 20 mg by mouth daily at 6 PM.       . sertraline (ZOLOFT) 100 MG tablet Take 100 mg by mouth daily at 6 PM.       . Teriparatide, Recombinant, (FORTEO [place]) Inject into the skin daily.       Marland Kitchen VITAMIN E PO Take 1 capsule by mouth every morning.        Results for orders placed during the hospital encounter of 08/25/12 (from the past 48 hour(s))  SURGICAL PCR SCREEN     Status: Normal   Collection Time   08/25/12  2:58 PM      Component Value Range Comment   MRSA, PCR NEGATIVE  NEGATIVE    Staphylococcus aureus NEGATIVE  NEGATIVE   BASIC METABOLIC PANEL     Status: Abnormal   Collection Time   08/25/12  2:58 PM      Component Value Range Comment   Sodium 132 (*) 135 - 145 mEq/L    Potassium 4.4  3.5 - 5.1 mEq/L HEMOLYSIS AT THIS LEVEL MAY AFFECT RESULT   Chloride 94 (*) 96 - 112 mEq/L    CO2 29  19 - 32 mEq/L    Glucose, Bld 89  70 - 99 mg/dL    BUN 12  6 - 23 mg/dL    Creatinine, Ser 4.78  0.50 - 1.10 mg/dL    Calcium 9.5  8.4 - 29.5 mg/dL    GFR calc non Af Amer >90  >90 mL/min    GFR calc Af Amer >90  >90 mL/min   CBC     Status: Abnormal   Collection Time   08/25/12  2:58  PM      Component Value Range Comment   WBC 6.7  4.0 - 10.5 K/uL    RBC 3.74 (*) 3.87 - 5.11 MIL/uL    Hemoglobin 13.0  12.0 - 15.0 g/dL    HCT 09.8 (*) 11.9 - 46.0 %    MCV 94.4  78.0 - 100.0 fL    MCH 34.8  (*) 26.0 - 34.0 pg    MCHC 36.8 (*) 30.0 - 36.0 g/dL    RDW 14.7  82.9 - 56.2 %    Platelets 247  150 - 400 K/uL    No results found.  ROS  No recent f/c/n/v/wt loss.  Blood pressure 129/81, pulse 73, temperature 98 F (36.7 C), temperature source Oral, resp. rate 18, SpO2 94.00%. Physical Exam wn wd woman in nad.  A and O x 4.  Mood and affect normal.  EOMI.  Respirations unlabored.  R ankle with healed laceration.  Skin haelthy and intact.  Palpable DP and PT pulses.  Normal sens to LT throughout foot.  No lymphadenopathy.  5/5 strength in PF and DF of ankle.  Assessment/Plan Right ankle and subtalar joint arthritis - to OR for right ankle and subtalar joint arthrodesis.  The risks and benefits of the alternative treatment options have been discussed in detail.  The patient wishes to proceed with surgery and specifically understands risks of bleeding, infection, nerve damage, blood clots, need for additional surgery, amputation and death.   [name] 08/30/2012, 11:05 AM

## 2012-08-27 NOTE — Anesthesia Procedure Notes (Addendum)
Anesthesia Regional Block:  Popliteal block  Pre-Anesthetic Checklist: ,, timeout performed, Correct Patient, Correct Site, Correct Laterality, Correct Procedure, Correct Position, site marked, Risks and benefits discussed,  Surgical consent,  Pre-op evaluation,  At surgeon's request and post-op pain management  Laterality: Right  Prep: chloraprep       Needles:  Injection technique: Single-shot     Needle Length: 9cm  Needle Gauge: 22 and 22 G    Additional Needles:  Procedures: nerve stimulator Popliteal block  Nerve Stimulator or Paresthesia:  Response: 0.48 mA,   Additional Responses:   Narrative:  Start time: 08/27/2012 12:18 PM End time: 08/27/2012 12:31 PM Injection made incrementally with aspirations every 5 mL. Anesthesiologist: Dr Gypsy Balsam  Additional Notes: 1610-9604 R Popl Nerve Block POP CHG prep, sterile tech #22 stim needle w/stim down to .48ma Multiple neg asp Marc .5% w/epi 50cc+decadron 4mg  infiltrated No compl Dr Gypsy Balsam   Procedure Name: LMA Insertion Date/Time: 08/27/2012 1:15 PM Performed by: Gwenyth Allegra Pre-anesthesia Checklist: Patient identified, Timeout performed, Emergency Drugs available, Suction available and Patient being monitored Patient Re-evaluated:Patient Re-evaluated prior to inductionOxygen Delivery Method: Circle system utilized Preoxygenation: Pre-oxygenation with 100% oxygen Intubation Type: IV induction Ventilation: Mask ventilation without difficulty LMA: LMA with gastric port inserted LMA Size: 4.0 Placement Confirmation: positive ETCO2 and breath sounds checked- equal and bilateral Tube secured with: Tape Dental Injury: Teeth and Oropharynx as per pre-operative assessment

## 2012-08-28 MED ORDER — DSS 100 MG PO CAPS: 100.0000 mg | ORAL_CAPSULE | Freq: Two times a day (BID) | ORAL | Status: AC

## 2012-08-28 MED ORDER — DIPHENHYDRAMINE HCL 50 MG/ML IJ SOLN: 25.0000 mg | Freq: Four times a day (QID) | INTRAMUSCULAR | Status: DC | PRN

## 2012-08-28 MED ORDER — OXYCODONE HCL 5 MG PO TABS: 5.0000 mg | ORAL_TABLET | ORAL | Status: AC | PRN

## 2012-08-28 MED ORDER — SENNA 8.6 MG PO TABS: 2.0000 | ORAL_TABLET | Freq: Two times a day (BID) | ORAL | Status: DC

## 2012-08-28 MED ORDER — ASPIRIN BUFFERED 325 MG PO TABS: 325.0000 mg | ORAL_TABLET | Freq: Every day | ORAL | Status: DC

## 2012-08-28 NOTE — Discharge Summary (Addendum)
Physician Discharge Summary  Patient ID: [name] MRN: [id number] DOB/AGE: [birth year] 76 y.o.  Admit date: 08/27/2012 Discharge date: 08/28/2012  Admission Diagnoses:  Htn, CAD, hypothyroidism, afib, GERD Right ankle arthritis  Discharge Diagnoses:  Same S/p R ankle and subtalar arthrodeses   Discharged Condition: stable  Hospital Course: Pt was admitted to the hospital on 08/27/12.  She underwent arthrodeses of her right ankle and subtalar joints.  She tolerated the procedure well and was transferred to 5N where she remained for the duration of her hospital stay.  She had PT and OT evaluations both recommending SNF placement.  She is discharged to SNF in stable condition on 08/30/12. She will need to continue NWB on the R LE and continue with PT and OT.  Consults: None  Significant Diagnostic Studies: none  Treatments: PT  Discharge Exam: Blood pressure 110/56, pulse 70, temperature 98.2 F (36.8 C), temperature source Oral, resp. rate 18, height 5\' 2"  (1.575 m), weight 68.04 kg (150 lb), SpO2 97.00%. wn wd woman in nad.  A and O x 4.  Mood and affect normal.  EOMI.  Respirations unlabored.  R ankle immobilized in short leg splint.  Toes well perfused.  Disposition: snf  Discharge Orders    Future Orders Please Complete By Expires   Diet - low sodium heart healthy      Call MD / Call 911      Comments:   If you experience chest pain or shortness of breath, CALL 911 and be transported to the hospital emergency room.  If you develope a fever above 101 F, pus (white drainage) or increased drainage or redness at the wound, or calf pain, call your surgeon's office.   Constipation Prevention      Comments:   Drink plenty of fluids.  Prune juice may be helpful.  You may use a stool softener, such as Colace (over the counter) 100 mg twice a day.  Use MiraLax (over the counter) for constipation as needed.   Increase activity slowly as tolerated      Non weight bearing      Comments:   Right lower extremity       Medication List     As of 08/28/2012  1:46 PM    STOP taking these medications         aspirin 81 MG chewable tablet      TAKE these medications         acetaminophen 325 MG tablet   Commonly known as: TYLENOL   Take 650 mg by mouth every 6 (six) hours as needed. For pain/fever      albuterol (2.5 MG/3ML) 0.083% nebulizer solution   Commonly known as: PROVENTIL   Take 2.5 mg by nebulization 3 (three) times daily as needed. For breathing      albuterol 108 (90 BASE) MCG/ACT inhaler   Commonly known as: PROVENTIL HFA;VENTOLIN HFA   Inhale 2 puffs into the lungs every 6 (six) hours as needed. For shortness of breath      ALPRAZolam 0.5 MG tablet   Commonly known as: XANAX   Take 1 mg by mouth at bedtime as needed. For sleep      aspirin 325 MG buffered tablet   Take 1 tablet (325 mg total) by mouth daily.      Calcium 600+D 600-400 MG-UNIT per tablet   Generic drug: Calcium Carbonate-Vitamin D   Take 1 tablet by mouth daily at 6 PM.  cholecalciferol 1000 UNITS tablet   Commonly known as: VITAMIN D   Take 1,000 Units by mouth 2 (two) times daily with breakfast and lunch.      cyclobenzaprine 10 MG tablet   Commonly known as: FLEXERIL   Take 10 mg by mouth 2 (two) times daily as needed. FOR PAIN      dextromethorphan-guaiFENesin 30-600 MG per 12 hr tablet   Commonly known as: MUCINEX DM   Take 1 tablet by mouth every 12 (twelve) hours. scheduled      dicyclomine 10 MG capsule   Commonly known as: BENTYL   Take 10-20 mg by mouth every 6 (six) hours as needed. For spasms      DSS 100 MG Caps   Take 100 mg by mouth 2 (two) times daily.      fluticasone 50 MCG/ACT nasal spray   Commonly known as: FLONASE   Place 2 sprays into the nose daily.      FORTEO [place]   Inject into the skin daily.      isosorbide mononitrate 60 MG 24 hr tablet   Commonly known as: IMDUR   Take 60 mg by mouth daily.      levothyroxine 25 MCG  tablet   Commonly known as: SYNTHROID, LEVOTHROID   Take 50 mcg by mouth daily.      loratadine 10 MG tablet   Commonly known as: CLARITIN   Take 10 mg by mouth every morning.      metoprolol 50 MG tablet   Commonly known as: LOPRESSOR   Take 50 mg by mouth 2 (two) times daily.      montelukast 10 MG tablet   Commonly known as: SINGULAIR   Take 1 tablet (10 mg total) by mouth at bedtime.      multivitamin with minerals Tabs   Take 1 tablet by mouth daily.      nitroGLYCERIN 0.4 MG SL tablet   Commonly known as: NITROSTAT   Place 0.4 mg under the tongue every 5 (five) minutes as needed. For chest pain      omega-3 acid ethyl esters 1 G capsule   Commonly known as: LOVAZA   Take 1 g by mouth 2 (two) times daily.      omeprazole-sodium bicarbonate 40-1100 MG per capsule   Commonly known as: ZEGERID   Take 1 capsule by mouth daily before breakfast.      oxyCODONE 5 MG immediate release tablet   Commonly known as: Oxy IR/ROXICODONE   Take 1-2 tablets (5-10 mg total) by mouth every 4 (four) hours as needed.      rosuvastatin 20 MG tablet   Commonly known as: CRESTOR   Take 20 mg by mouth daily at 6 PM.      sertraline 100 MG tablet   Commonly known as: ZOLOFT   Take 100 mg by mouth daily at 6 PM.      VITAMIN C PO   Take 1 tablet by mouth every morning.      VITAMIN E PO   Take 1 capsule by mouth every morning.      ZOFRAN 4 MG tablet   Generic drug: ondansetron   Take 4 mg by mouth every 12 (twelve) hours as needed. For nausea           Follow-up Information    Follow up with [name], [name], MD. Schedule an appointment as soon as possible for a visit in 2 weeks.   Contact information:   [address] [place] [address] [phone]          Signed: [name] 08/28/2012, 1:46 PM  Addendum:  Pt developed burning on urination over the weekend while awaiting placement.  She was started on cipro 250 po q12h x 7 days.  Otherwise she did  well over the weekend and is discharged to SNF today.  Continue NWB on R LE.  F/u with me in 10-14 days.

## 2012-08-28 NOTE — Progress Notes (Addendum)
Clinical Social Work Department CLINICAL SOCIAL WORK PLACEMENT NOTE 08/28/2012  Patient:  AALA, RANSOM  Account Number:  [id number] Admit date:  08/27/2012  Clinical Social Worker:  Lupita Leash Nacole Fluhr, BSW  Date/time:  08/28/2012 02:30 PM  Clinical Social Work is seeking post-discharge placement for this patient at the following level of care:   SKILLED NURSING   (*CSW will update this form in Epic as items are completed)   08/28/2012  Patient/family provided with [facility] Department of Clinical Social Work's list of facilities offering this level of care within the geographic area requested by the patient (or if unable, by the patient's family).  08/28/2012  Patient/family informed of their freedom to choose among providers that offer the needed level of care, that participate in Medicare, Medicaid or managed care program needed by the patient, have an available bed and are willing to accept the patient.  08/28/2012  Patient/family informed of [facility]' ownership interest in [facility], as well as of the fact that they are under no obligation to receive care at this facility.  PASARR submitted to EDS on  PASARR number received from EDS on   FL2 transmitted to all facilities in geographic area requested by pt/family on  08/28/2012 FL2 transmitted to all facilities within larger geographic area on   Patient informed that his/her managed care company has contracts with or will negotiate with  certain facilities, including the following:   Searching [place] for SNF bed. Existing PASARR in place     Patient/family informed of bed offers received: 08/31/12 Patient chooses bed at [facility] Physician recommends and patient chooses bed at    Patient to be transferred to [facility] on  08/1612 Patient to be transferred to facility by Ambulance  The following physician request were entered in Epic:   Additional Comments: Patient is pleased with d/c plan. Spoke  with her daughter-in-lawOlegario Messier (c) [phone] per [place] of patient to advise of need to apply for nursing home Medicaid. Patient will have a co-pay with her insurance.  Olegario Messier stated that they would initiate a medicaid application in [place].  DC completed. Notified SNF and pt's nurse [place] of d/c plan.  Lorri Frederick. [place]  [phone]

## 2012-08-28 NOTE — Progress Notes (Signed)
Physical Therapy Evaluation Patient Details Name: [name] MRN: [id number] DOB: 06/18/1936 Today's Date: 08/28/2012 Time: 1025-1100 PT Time Calculation (min): 35 min  PT Assessment / Plan / Recommendation Clinical Impression  Pt is a 76 y.o. F s/p R ankle ORIF.  Pt will benefit from acute care PT to increase indepence in mobility, transfers, strength, and balance through exercises and activities.    PT Assessment  Patient needs continued PT services    Follow Up Recommendations  Skilled nursing facility    Barriers to Discharge Decreased caregiver support      Equipment Recommendations  Defer to next venue    Recommendations for Other Services Other (comment) (None)   Frequency Min 5X/week    Precautions / Restrictions Restrictions Weight Bearing Restrictions: Yes RLE Weight Bearing: Non weight bearing   Pertinent Vitals/Pain 4/10 R LE      Mobility  Bed Mobility Bed Mobility: Supine to Sit;Sitting - Scoot to Edge of Bed Supine to Sit: 4: Min guard Sitting - Scoot to [place] of Bed: 4: Min guard Details for Bed Mobility Assistance: Cues for proper technique and hand placement Transfers Transfers: Sit to Stand;Stand to Sit;Stand Pivot Transfers Sit to Stand: 3: Mod assist Stand to Sit: 3: Mod assist Stand Pivot Transfers: 3: Mod assist Details for Transfer Assistance: (A) with initiating mvt, balance, and maintaining proper WB restrictions.  Max cues for hand placement, proper technique, and safety awareness.  (A) to keep right LE off floor and maintain right LE NWB.  Attempted to initiate gait however pt unable to hop in place.  Pt able to pivot on L LE to recliner with max cues.  Ambulation/Gait Ambulation/Gait Assistance: Not tested (comment) Wheelchair Mobility Wheelchair Mobility: No    Exercises     PT Diagnosis: Generalized weakness;Acute pain  PT Problem List: Decreased strength;Decreased range of motion;Decreased activity tolerance;Decreased  balance;Decreased mobility;Decreased knowledge of use of DME;Decreased safety awareness;Pain PT Treatment Interventions: DME instruction;Functional mobility training;Therapeutic activities;Therapeutic exercise;Balance training;Patient/family education   PT Goals Acute Rehab PT Goals PT Goal Formulation: With patient Time For Goal Achievement: 08/28/12 Potential to Achieve Goals: Good Pt will go Supine/Side to Sit: with modified independence PT Goal: Supine/Side to Sit - Progress: Goal set today Pt will Sit at Edge of Bed: with modified independence PT Goal: Sit at Edge Of Bed - Progress: Goal set today Pt will go Sit to Supine/Side: with modified independence PT Goal: Sit to Supine/Side - Progress: Goal set today Pt will go Sit to Stand: with modified independence PT Goal: Sit to Stand - Progress: Goal set today Pt will go Stand to Sit: with modified independence PT Goal: Stand to Sit - Progress: Goal set today Pt will Transfer Bed to Chair/Chair to Bed: with modified independence PT Transfer Goal: Bed to Chair/Chair to Bed - Progress: Goal set today Pt will Stand: with modified independence PT Goal: Stand - Progress: Goal set today Pt will Ambulate: 16 - 50 feet PT Goal: Ambulate - Progress: Goal set today Pt will Perform Home Exercise Program: Independently PT Goal: Perform Home Exercise Program - Progress: Goal set today  Visit Information  Last PT Received On: 08/28/12 Assistance Needed: +1    Subjective Data  Subjective: My foot is not hurting as bad as my back because my back gets sore when I lay down too much. Patient Stated Goal: To be d/c to SNF   Prior Functioning  Home Living Lives With: Alone Available Help at Discharge: Skilled Nursing Facility Type of  Home: Apartment Home Access: Ramped entrance Home Layout: One level Bathroom Shower/Tub: Tub/shower unit;Curtain Bathroom Toilet: Handicapped height Bathroom Accessibility: Yes How Accessible: Accessible via  walker Home Adaptive Equipment: Other (comment);Grab bars in shower;Grab bars around toilet;Walker - rolling Aeronautical engineer) Prior Function Level of Independence: Independent with assistive device(s) Able to Take Stairs?: No Driving: Yes Vocation: Retired Musician: No difficulties Dominant Hand: Right    Cognition  Overall Cognitive Status: Appears within functional limits for tasks assessed/performed Arousal/Alertness: Awake/alert Orientation Level: Appears intact for tasks assessed Behavior During Session: [facility] for tasks performed    Extremity/Trunk Assessment     Balance    End of Session PT - End of Session Equipment Utilized During Treatment: Gait belt Activity Tolerance: Patient tolerated treatment well Patient left: in chair;with call bell/phone within reach;with family/visitor present Nurse Communication: Mobility status  GP     DITOMMASO, AMY 08/28/2012, 11:24 AM Jake Shark, PT DPT [phone]

## 2012-08-28 NOTE — Op Note (Signed)
[name], [name] NO.:  [id number]  MEDICAL RECORD NO.:  [id number]  LOCATION:  5N19C                        FACILITY:  [facility]  PHYSICIAN:  [name], MD        DATE OF BIRTH:  [birth year]  DATE OF PROCEDURE:  08/27/2012 DATE OF DISCHARGE:                              OPERATIVE REPORT   PREOPERATIVE DIAGNOSES: 1. 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  POSTOPERATIVE DIAGNOSES: 1. 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  PROCEDURE: 1. Right ankle arthrodesis. 2. Right subtalar joint arthrodesis. 3. Bone graft from the fibula to the ankle and subtalar joints. 4. Intraoperative interpretation of fluoroscopic imaging greater than     1 hour.  SURGEON:  [name], MD  ANESTHESIA:  General, regional.  ESTIMATED BLOOD LOSS:  Minimal.  TOURNIQUET TIME:  2 hours at 300 mmHg.  COMPLICATIONS:  None apparent.  DISPOSITION:  Extubated awake and stable to recovery.  INDICATION FOR PROCEDURE:  The patient is a 76 year old female with past medical history significant for an open fracture of the right ankle in the remote past.  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  She presents now for operative treatment of this condition having failed nonoperative measures including activity modification, oral pain medicines, bracing, and physical therapy.  She understands risks, benefits, alternatives, treatment options, and elects surgical treatment.  She specifically understands risks of bleeding, infection, nerve damage, blood clots, need for additional surgery, amputation, and death.  PROCEDURE IN DETAIL:  After preoperative consent was obtained and the correct operative site was identified, the patient was brought to the operating room and placed supine on the operating table.  General anesthesia was induced.  Preoperative antibiotics were administered. Surgical time-out was taken.   Right lower extremity was prepped and draped in standard sterile fashion with tourniquet around the thigh. The extremity was exsanguinated and tourniquet was inflated to 300 mmHg. A curvilinear incision was made over the fibula laterally.  Sharp dissection was carried down through the skin and subcutaneous tissue. The fibula was skeletonized and an oblique osteotomy was made just proximal to the level of the ankle joint.  The resected fibula was then passed off to the back table where it was morselized as cancellous bone graft.  The ankle joint was then opened.  There was essentially no cartilage and only eburnated sclerotic bone exposed.  Scant amount of remaining cartilage was removed with a curette and rongeur.  Subtalar joint was opened.  The remaining cartilage was removed with a curette and rongeur.  Both the ankle and subtalar joints were irrigated copiously.  A small drill bit was used to perforate the subchondral bone in multiple locations over both the calcaneal and talar surfaces of the subtalar joint and the tibial and talar surfaces of the ankle joint.  A small osteotome was then used to break up the remaining subchondral bone.  The bone graft from the fibula was then packed into the ankle and subtalar joints.  Joints were reduced and manually compressed.  Two oblique 0.0625 K-wires were inserted across the 2 joints holding the compressed and reduced positions of  the ankle and subtalar joints.  At this point, a longitudinal incision was made on the sole of the foot just lateral to the midline adjacent to the anterior aspect of the heel pad.  Sharp dissection was carried down through the skin.  Blunt dissection was carried down through the subcutaneous tissue to the level of the bone.  The neurovascular bundle and flexor tendons were swept medially as a guide pin was inserted in line with the tibial canal in both the AP and lateral planes.  Fluoroscopic imaging was used  to confirm appropriate position of the guide pin and it was advanced across the subtalar and ankle joints.  Starter reamer was then introduced and drilled across the subtalar and ankle joints.  A guide pin was removed and replaced with a ball-tipped guidewire which was advanced into the medullary canal of the tibia.  The canal was then sequentially reamed to a diameter of 12 mm.  An 11 mm x 180 mm nail was selected and inserted into the reamed canal.  The external alignment guide was then used to target the screw in the central portion of the talus.  The guide was used to set the appropriate rotation of the nail such that the screw into the calcaneus was aligned appropriately.  The targeting guide was then used to insert the talar screw from lateral to medial.  The guide was then repositioned and used to insert 2 interlocking screws at the proximal end of the nail from medial to lateral.  These were placed through a single stab incision at the medial aspect of the leg.  AP and lateral views confirmed appropriate position of the tibial and talar screws.  The compression screw was then used to compress the tibiotalar joint.  The alignment guide was then rotated around to the posterior aspect of the heel and a stab incision was made.  The calcaneus was drilled and a fully-threaded 5 mm screw was inserted and interlocked with the nail.  The subtalar joint had previously been compressed with the foot plate prior to insertion of this calcaneal screw.  The distal locking mechanism was then engaged to fix the calcaneal screw to the nail distally.  At this point, AP and lateral views showed appropriate reduction of the ankle and subtalar joints and appropriate position and length of all hardware.  The wounds were all irrigated copiously.  The deep subcutaneous tissue was approximated over the lateral joint line with inverted simple sutures of 0-Vicryl.  Subcutaneous tissue was approximated with  inverted simple sutures of 3-0 Monocryl and the skin incision was closed with 3-0 Prolene running stitch.  The remaining incisions were all closed in similar fashion.  Sterile dressings were applied followed by a compression wrap and short-leg splint.  The tourniquet was released at 2 hours.  The patient was awakened from anesthesia and transported to the recovery room in stable condition.  FOLLOWUP PLAN:  The patient will be nonweightbearing on the right lower extremity.  She will be admitted for physical therapy, occupational therapy, and placement in a skilled nursing facility for acute rehab.     [name], MD     JH/MEDQ  D:  08/27/2012  T:  08/28/2012  Job:  [id number]

## 2012-08-28 NOTE — Progress Notes (Signed)
Patient seen by Dr. Victorino Dike post PT/OT evaluation. Ordered foley discontinued. Order carried out as ordered. Patient verbalized satisfaction of foley removal. Will monitor voiding.

## 2012-08-28 NOTE — Progress Notes (Signed)
Clinical Social Work Department BRIEF PSYCHOSOCIAL ASSESSMENT 08/28/2012  Patient:  [name], [name]     Account Number:  [id number]     Admit date:  08/27/2012  Clinical Social Worker:  Burnard Hawthorne  Date/Time:  08/28/2012 02:00 PM  Referred by:  Physician  Date Referred:  08/28/2012 Referred for  SNF Placement   Other Referral:   Interview type:  Patient Other interview type:    PSYCHOSOCIAL DATA Living Status:  ALONE Admitted from facility:   Level of care:   Primary support name:  Oren Section Primary support relationship to patient:  CHILD, ADULT Degree of support available:   Good support    CURRENT CONCERNS Current Concerns  Post-Acute Placement   Other Concerns:    SOCIAL WORK ASSESSMENT / PLAN Met with patient- she is requesting short term rehab and verbalized a desire to seek placement at the [facility].  Spoke to [place] - Admissions at the [facility]- they currently do not have any bed. Discussed bed search and patient agreed to search in [place]  Bed search initated.  2 of the 5 facilities do not accept her insurance. Awaiting call back from other facilities regarding possible bed offers.   Assessment/plan status:  Psychosocial Support/Ongoing Assessment of Needs Other assessment/ plan:   Information/referral to community resources:   SNF bed search provided to patient    PATIENT'S/FAMILY'S RESPONSE TO PLAN OF CARE: Patient is alert, oriented and very pleasant. She wants to seek short term SNF and feels that she would not be able to manage at home at this time. She does not have anyone to stay with her at home.

## 2012-08-28 NOTE — Progress Notes (Signed)
Subjective: 1 Day Post-Op Procedure(s) (LRB): ARTHRODESIS ANKLE (Right) Patient reports pain as 0 on 0-10 scale.  No n/v/f/c.  [place] regular diet.  Wants her foley out.  Objective: Vital signs in last 24 hours: Temp:  [97.8 F (36.6 C)-98.6 F (37 C)] 98.2 F (36.8 C) (09/13 1610) Pulse Rate:  [70-86] 70  (09/13 0613) Resp:  [10-18] 18  (09/13 0613) BP: (110-136)/(56-65) 110/56 mmHg (09/13 0613) SpO2:  [93 %-97 %] 97 % (09/13 0613) Weight:  [68.04 kg (150 lb)] 68.04 kg (150 lb) (09/12 1717)   Intake/Output from previous day: 09/12 0701 - 09/13 0700 In: 1500 [I.V.:1500] Out: 680 [Urine:650; Blood:30] Intake/Output this shift: Total I/O In: 600 [P.O.:600] Out: -    Basename 08/25/12 1458  HGB 13.0    Basename 08/25/12 1458  WBC 6.7  RBC 3.74*  HCT 35.3*  PLT 247    Basename 08/25/12 1458  NA 132*  K 4.4  CL 94*  CO2 29  BUN 12  CREATININE 0.53  GLUCOSE 89  CALCIUM 9.5     PE:  wn wd woman in nad.  R LE immobilized.  Toes well perfused.  Assessment/Plan: 1 Day Post-Op Procedure(s) (LRB): ARTHRODESIS ANKLE (Right) Up with therapy  Plan discharge to SNF on Sunday if they will take an admission.  In the meantime we'll continue PT.  D/c foley now.  Continue NWB on R LE.   [name] 08/28/2012, 1:35 PM

## 2012-08-28 NOTE — Progress Notes (Signed)
Utilization review completed. Anette Barra, RN, BSN. 

## 2012-08-29 NOTE — Progress Notes (Signed)
Occupational Therapy Evaluation Patient Details Name: [name] MRN: [id number] DOB: 1936/01/15 Today's Date: 08/29/2012 Time: 4098-1191 OT Time Calculation (min): 32 min  OT Assessment / Plan / Recommendation Clinical Impression  Pleasant 76 yo s/p R ankle arthrodesis. NWB RLE. Pt will benefit from skilled OT services due to below deficits to facilitateD/C to next venue of care. Pt will benefit from rehab at SNF prior to return home to return to PLOF.     OT Assessment  Patient needs continued OT Services    Follow Up Recommendations  Skilled nursing facility    Barriers to Discharge Decreased caregiver support    Equipment Recommendations  Rolling walker with 5" wheels;3 in 1 bedside comode;Tub/shower bench;Defer to next venue    Recommendations for Other Services    Frequency  Min 2X/week    Precautions / Restrictions Precautions Precautions: Fall Restrictions RLE Weight Bearing: Non weight bearing   Pertinent Vitals/Pain 2    ADL  Grooming: Performed;Set up Where Assessed - Grooming: Unsupported sitting Upper Body Bathing: Simulated;Set up Where Assessed - Upper Body Bathing: Unsupported sitting Lower Body Bathing: Simulated;Moderate assistance Where Assessed - Lower Body Bathing: Supported sit to stand Upper Body Dressing: Simulated;Set up Where Assessed - Upper Body Dressing: Unsupported sitting Lower Body Dressing: Simulated;Moderate assistance Where Assessed - Lower Body Dressing: Supported sit to Pharmacist, hospital: Performed;Moderate assistance Toilet Transfer Method: Sit to stand;Stand pivot Toilet Transfer Equipment: Bedside commode Toileting - Clothing Manipulation and Hygiene: Performed;Supervision/safety Where Assessed - Toileting Clothing Manipulation and Hygiene: Sit to stand from 3-in-1 or toilet Equipment Used: Gait belt;Rolling walker Transfers/Ambulation Related to ADLs: Mod A to maintain WBS of NWB ADL Comments: will benefit from Ae      OT Diagnosis: Generalized weakness;Acute pain  OT Problem List: Decreased strength;Decreased range of motion;Decreased activity tolerance;Decreased safety awareness;Decreased knowledge of use of DME or AE;Decreased knowledge of precautions;Pain OT Treatment Interventions: Self-care/ADL training;Therapeutic exercise;Energy conservation;DME and/or AE instruction;Therapeutic activities;Patient/family education   OT Goals Acute Rehab OT Goals OT Goal Formulation: With patient Time For Goal Achievement: 09/05/12 Potential to Achieve Goals: Good ADL Goals Pt Will Perform Lower Body Bathing: with min assist;Supported;with adaptive equipment;with cueing (comment type and amount);Sit to stand from chair ADL Goal: Lower Body Bathing - Progress: Goal set today Pt Will Perform Lower Body Dressing: with min assist;Sit to stand from chair;Supported;with adaptive equipment;with cueing (comment type and amount) ADL Goal: Lower Body Dressing - Progress: Goal set today Pt Will Transfer to Toilet: with supervision;with DME;3-in-1;Maintaining weight bearing status;with cueing (comment type and amount) ADL Goal: Toilet Transfer - Progress: Goal set today Pt Will Perform Toileting - Clothing Manipulation: with modified independence;Sitting on 3-in-1 or toilet ADL Goal: Toileting - Clothing Manipulation - Progress: Goal set today Arm Goals Pt Will Complete Theraband Exer: with supervision, verbal cues required/provided;2 sets;Bilateral upper extremities;to increase strength;Level 1 Theraband Arm Goal: Theraband Exercises - Progress: Goal set today  Visit Information  Last OT Received On: 08/29/12 Assistance Needed: +1    Subjective Data      Prior Functioning  Vision/Perception  Home Living Lives With: Alone Available Help at Discharge: Skilled Nursing Facility Type of Home: Apartment Home Access: Ramped entrance Home Layout: One level Bathroom Shower/Tub: Tub/shower unit;Curtain Firefighter:  Handicapped height Bathroom Accessibility: Yes How Accessible: Accessible via walker Home Adaptive Equipment: Other (comment);Grab bars in shower;Grab bars around toilet;Walker - rolling Aeronautical engineer) Prior Function Level of Independence: Independent with assistive device(s) Able to Take Stairs?: No Driving: Yes Vocation: Retired Special educational needs teacher  Communication: No difficulties Dominant Hand: Right      Cognition  Overall Cognitive Status: Appears within functional limits for tasks assessed/performed Arousal/Alertness: Awake/alert Orientation Level: Appears intact for tasks assessed Behavior During Session: [facility] for tasks performed    Extremity/Trunk Assessment Right Upper Extremity Assessment RUE ROM/Strength/Tone: Within functional levels Left Upper Extremity Assessment LUE ROM/Strength/Tone: Within functional levels Right Lower Extremity Assessment RLE ROM/Strength/Tone: Deficits;Due to precautions Left Lower Extremity Assessment LLE ROM/Strength/Tone: WFL for tasks assessed Trunk Assessment Trunk Assessment: Normal   Mobility  Shoulder Instructions  Bed Mobility Bed Mobility: Sit to Supine Supine to Sit: 4: Min assist;HOB flat;With rails Sitting - Scoot to Edge of Bed: 5: Supervision Transfers Transfers: Sit to Stand;Stand to Sit Sit to Stand: 4: Min assist;From bed;With upper extremity assist Stand to Sit: To chair/3-in-1;With upper extremity assist;4: Min assist Details for Transfer Assistance: mod A to maintain WBS       Exercise     Balance  MinA for dynamic tasks   End of Session OT - End of Session Equipment Utilized During Treatment: Gait belt Activity Tolerance: Patient limited by fatigue Patient left: in bed;with call bell/phone within reach Nurse Communication: Mobility status;Precautions;Weight bearing status  GO     [name],[name] 08/29/2012, 6:33 PM [facility], OTR/L  [phone] 08/29/2012

## 2012-08-29 NOTE — Progress Notes (Signed)
Physical Therapy Treatment Patient Details Name: [name] MRN: [id number] DOB: Aug 21, 1936 Today's Date: 08/29/2012 Time: 4098-1191 PT Time Calculation (min): 29 min  PT Assessment / Plan / Recommendation Comments on Treatment Session       Follow Up Recommendations  Skilled nursing facility    Barriers to Discharge        Equipment Recommendations  Rolling walker with 5" wheels;Wheelchair (measurements)    Recommendations for Other Services    Frequency Min 5X/week   Plan Discharge plan remains appropriate    Precautions / Restrictions Restrictions Weight Bearing Restrictions: Yes RLE Weight Bearing: Non weight bearing   Pertinent Vitals/Pain 4/10 in right ankle    Mobility  Bed Mobility Bed Mobility: Supine to Sit;Sitting - Scoot to Edge of Bed Supine to Sit: 5: Supervision;With rails;HOB elevated Sitting - Scoot to Edge of Bed: 5: Supervision;With rail Transfers Transfers: Sit to Stand;Stand to Sit;Stand Pivot Transfers Sit to Stand: 4: Min assist;With upper extremity assist;With armrests;From bed;From chair/3-in-1 Stand to Sit: 4: Min assist;With upper extremity assist;With armrests;To chair/3-in-1 Stand Pivot Transfers: 4: Min assist;With armrests Details for Transfer Assistance: Pt transferred to [facility] from bed then to recliner chair Ambulation/Gait Ambulation/Gait Assistance Details: Pt stood for about 30 seconds with rolling walker with supervision.  Unable to unweight LLE in order to hop.   Wheelchair Mobility Wheelchair Mobility: No    Exercises General Exercises - Lower Extremity Ankle Circles/Pumps: AROM;Left;10 reps;Supine Long Arc Quad: AROM;Left;10 reps;Seated Heel Slides: Left;AROM;10 reps;Supine Hip ABduction/ADduction: AROM;Left;10 reps;Supine   PT Diagnosis:    PT Problem List:   PT Treatment Interventions:     PT Goals Acute Rehab PT Goals PT Goal Formulation: With patient Time For Goal Achievement: 08/28/12 Potential to Achieve Goals:  Good Pt will go Supine/Side to Sit: with modified independence PT Goal: Supine/Side to Sit - Progress: Progressing toward goal Pt will Sit at [facility] of Bed: with modified independence PT Goal: Sit at Edge Of Bed - Progress: Progressing toward goal Pt will go Sit to Supine/Side: with modified independence PT Goal: Sit to Supine/Side - Progress: Progressing toward goal Pt will go Sit to Stand: with modified independence PT Goal: Sit to Stand - Progress: Progressing toward goal Pt will go Stand to Sit: with modified independence PT Goal: Stand to Sit - Progress: Progressing toward goal Pt will Transfer Bed to Chair/Chair to Bed: with modified independence PT Transfer Goal: Bed to Chair/Chair to Bed - Progress: Progressing toward goal Pt will Stand: with modified independence PT Goal: Stand - Progress: Progressing toward goal Pt will Ambulate: 16 - 50 feet PT Goal: Ambulate - Progress: Progressing toward goal Pt will Perform Home Exercise Program: Independently PT Goal: Perform Home Exercise Program - Progress: Progressing toward goal  Visit Information  Last PT Received On: 08/29/12 Assistance Needed: +1    Subjective Data      Cognition  Overall Cognitive Status: Appears within functional limits for tasks assessed/performed Arousal/Alertness: Awake/alert Orientation Level: Appears intact for tasks assessed Behavior During Session: [facility] for tasks performed    Balance     End of Session PT - End of Session Equipment Utilized During Treatment: Gait belt Activity Tolerance: Patient tolerated treatment well Patient left: in chair;with call bell/phone within reach;with family/visitor present Nurse Communication: Mobility status (pt c/o burning with urination. )   GP     Donnella Sham 08/29/2012, 9:12 AM Lavona Mound, PT  [phone] 08/29/2012

## 2012-08-29 NOTE — Progress Notes (Addendum)
   Subjective: 2 Days Post-Op Procedure(s) (LRB): ARTHRODESIS ANKLE (Right) Patient reports pain as mild and moderate.  Patient had a rough night. Sitting up in chair at this time. Patient seen in rounds with Dr. Lequita Halt. Patient is well, but has had some minor complaints of pain in the foot, requiring pain medications We will start therapy today.  Plan is to go Skilled nursing facility after hospital stay.  Objective: Vital signs in last 24 hours: Temp:  [98.4 F (36.9 C)-100.6 F (38.1 C)] 100.6 F (38.1 C) (09/14 0710) Pulse Rate:  [76-78] 76  (09/14 0710) Resp:  [16-18] 18  (09/14 0710) BP: (94-110)/(56-61) 110/56 mmHg (09/14 0710) SpO2:  [92 %-94 %] 94 % (09/14 0710)  Intake/Output from previous day:  Intake/Output Summary (Last 24 hours) at 08/29/12 0906 Last data filed at 08/28/12 1722  Gross per 24 hour  Intake    480 ml  Output      0 ml  Net    480 ml    Intake/Output this shift:    Labs: No results found for this basename: HGB:5 in the last 72 hours No results found for this basename: WBC:2,RBC:2,HCT:2,PLT:2 in the last 72 hours No results found for this basename: NA:2,K:2,CL:2,CO2:2,BUN:2,CREATININE:2,GLUCOSE:2,CALCIUM:2 in the last 72 hours No results found for this basename: LABPT:2,INR:2 in the last 72 hours  EXAM General - Patient is Alert, Appropriate and Oriented Extremity - moving toes well on exam Dressing - dressing C/D/I Motor Function - toes well on exam.    Past Medical History  Diagnosis Date  . GERD (gastroesophageal reflux disease)   . Anxiety disorder   . Arthritis   . CAD (coronary artery disease)     Cath May 2010.  Nonbstructive  . Depression   . Hypothyroid   . Asthmatic bronchitis   . Hypertension     dr Antoine Poche  . OA (osteoarthritis)   . Chronic bronchitis   . 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 Encephalitis     d/t meningitis  . PONV (postoperative nausea and vomiting)     history of  cardiac arrest day 1 post surgery  in 2008    Assessment/Plan: 2 Days Post-Op Procedure(s) (LRB): ARTHRODESIS ANKLE (Right) Active Problems:  * No active hospital problems. *    Advance diet Up with therapy  DVT Prophylaxis - Lovenox NON Weight-Bearing to right leg Plan is for SNF when be available.  PERKINS, ALEXZANDREW 08/29/2012, 9:06 AM

## 2012-08-30 NOTE — Progress Notes (Signed)
Subjective: Patient doing better this morning.  Pain has improved.  She is having some numbness to the toes but she is moving her toes on exam.  Tolerating her meds.  Plan is to go to SNF probably tomorrow.  Objective: Vital signs in last 24 hours: Temp:  [98.4 F (36.9 C)-99.4 F (37.4 C)] 99.4 F (37.4 C) (09/15 0609) Pulse Rate:  [77-83] 77  (09/15 0609) Resp:  [16-18] 18  (09/15 0609) BP: (90-140)/(49-88) 129/88 mmHg (09/15 0609) SpO2:  [90 %-94 %] 92 % (09/15 0609)  Intake/Output from previous day: 09/14 0701 - 09/15 0700 In: 480 [P.O.:480] Out: -  Intake/Output this shift:   EXAM  General - Patient is Alert, Appropriate and Oriented  Extremity - moving toes well on exam  Dressing - dressing C/D/I  Motor Function - toes well on exam.    Assessment/Plan: PROCEDURE:  1. Right ankle arthrodesis.  2. Right subtalar joint arthrodesis.  3. Bone graft from the fibula to the ankle and subtalar joints.  4. Intraoperative interpretation of fluoroscopic imaging greater than  1 hour.  Plan for SNF tomorrow.   Chanelle Hodsdon 08/30/2012, 9:25 AM

## 2012-08-31 ENCOUNTER — Encounter (HOSPITAL_COMMUNITY): Payer: Self-pay | Admitting: Orthopedic Surgery

## 2012-08-31 LAB — URINALYSIS, ROUTINE W REFLEX MICROSCOPIC
Bilirubin Urine: NEGATIVE
Glucose, UA: NEGATIVE mg/dL
Hgb urine dipstick: NEGATIVE
Ketones, ur: NEGATIVE mg/dL
Nitrite: NEGATIVE
Protein, ur: NEGATIVE mg/dL
Specific Gravity, Urine: 1.012 (ref 1.005–1.030)
Urobilinogen, UA: 1 mg/dL (ref 0.0–1.0)
pH: 7 (ref 5.0–8.0)

## 2012-08-31 LAB — URINE MICROSCOPIC-ADD ON

## 2012-08-31 MED ORDER — CIPROFLOXACIN HCL 250 MG PO TABS
250.0000 mg | ORAL_TABLET | Freq: Two times a day (BID) | ORAL | Status: DC
  Administered 2012-08-31: 250 mg via ORAL
  Filled 2012-08-31 (×3): qty 1

## 2012-08-31 MED ORDER — CIPROFLOXACIN HCL 250 MG PO TABS: 250.0000 mg | ORAL_TABLET | Freq: Two times a day (BID) | ORAL | Status: AC

## 2012-08-31 NOTE — Progress Notes (Signed)
Physical Therapy Treatment Patient Details Name: [name] MRN: [id number] DOB: [birth year] Today's Date: 08/31/2012 Time: 4696-2952 PT Time Calculation (min): 16 min  PT Assessment / Plan / Recommendation Comments on Treatment Session  Pt requested assistance with donning under garments and assistance with stand pivot transfer to transport gurney to maintain weight bearing status.    Follow Up Recommendations  Skilled nursing facility    Barriers to Discharge  NONE      Equipment Recommendations  Defer to next venue    Recommendations for Other Services Other (comment) (None)  Frequency Min 5X/week   Plan Discharge plan remains appropriate    Precautions / Restrictions Precautions Precautions: Fall Restrictions Weight Bearing Restrictions: Yes RLE Weight Bearing: Non weight bearing   Pertinent Vitals/Pain Began to feel pain in the bottom of her ankle before the pivot transfer.    Mobility  Bed Mobility Bed Mobility: Supine to Sit;Sitting - Scoot to Edge of Bed Supine to Sit: 4: Min guard;HOB elevated;With rails Sitting - Scoot to Edge of Bed: 5: Supervision Details for Bed Mobility Assistance: Cues for hand placement and safety Transfers Transfers: Sit to Stand;Stand to Sit;Stand Pivot Transfers Sit to Stand: 4: Min assist;From bed;With upper extremity assist Stand to Sit: 4: Min guard;Without upper extremity assist;To bed Stand Pivot Transfers: 4: Min assist Details for Transfer Assistance: (A) to maintain WBS and cues for proper technique and safety Ambulation/Gait Ambulation/Gait Assistance: Not tested (comment)    Exercises     PT Diagnosis:    PT Problem List:   PT Treatment Interventions:     PT Goals Acute Rehab PT Goals PT Goal Formulation: With patient Time For Goal Achievement: 08/28/12 Potential to Achieve Goals: Good Pt will go Supine/Side to Sit: with modified independence PT Goal: Supine/Side to Sit - Progress: Progressing toward goal Pt  will Sit at Edge of Bed: with modified independence PT Goal: Sit at Edge Of Bed - Progress: Progressing toward goal Pt will go Sit to Stand: with modified independence PT Goal: Sit to Stand - Progress: Progressing toward goal Pt will go Stand to Sit: with modified independence PT Goal: Stand to Sit - Progress: Progressing toward goal Pt will Transfer Bed to Chair/Chair to Bed: with modified independence PT Transfer Goal: Bed to Chair/Chair to Bed - Progress: Progressing toward goal  Visit Information  Last PT Received On: 08/31/12 Assistance Needed: +1    Subjective Data  Subjective: Can you find my robe for me in the bag Patient Stated Goal: To be d/c to SNF   Cognition  Overall Cognitive Status: Appears within functional limits for tasks assessed/performed Arousal/Alertness: Awake/alert Orientation Level: Appears intact for tasks assessed Behavior During Session: [facility] for tasks performed    Balance     End of Session PT - End of Session Activity Tolerance: Patient tolerated treatment well Patient left: Other (comment) (d/c to SNF via ambulance) Nurse Communication:  (d/c)   GP     DITOMMASO, AMY 08/31/2012, 2:45 PM Jake Shark, PT DPT [phone]

## 2012-08-31 NOTE — Progress Notes (Signed)
Subjective: POD 4 s/p R TTC fusion.  Pt c/o burning with urination.  Long h/o chronic UTIs.  No n/v/f/c.  Pain well controlled.  Objective: Vital signs in last 24 hours: Temp:  [98.1 F (36.7 C)-99.2 F (37.3 C)] 98.1 F (36.7 C) (09/16 0611) Pulse Rate:  [76-92] 80  (09/16 0611) Resp:  [18-20] 18  (09/16 0611) BP: (101-135)/(51-61) 101/59 mmHg (09/16 0611) SpO2:  [92 %-98 %] 92 % (09/16 0611)  Intake/Output from previous day: 09/15 0701 - 09/16 0700 In: 1640 [P.O.:1640] Out: -  Intake/Output this shift:    No results found for this basename: HGB:5 in the last 72 hours No results found for this basename: WBC:2,RBC:2,HCT:2,PLT:2 in the last 72 hours No results found for this basename: NA:2,K:2,CL:2,CO2:2,BUN:2,CREATININE:2,GLUCOSE:2,CALCIUM:2 in the last 72 hours No results found for this basename: LABPT:2,INR:2 in the last 72 hours  R LE splinted.  Toes with brik cap refill.  5/5 strength in PF and DF of toes.  Assessment/Plan: Start cipro for presumed UTI.  Send stat UA. Plan d/c todaY TO snf. cONTINUE NWB on R LE.   Nikie Cid 08/31/2012, 7:50 AM

## 2012-09-01 LAB — URINE CULTURE: Colony Count: 9000

## 2012-11-19 ENCOUNTER — Ambulatory Visit: Payer: Medicare Other | Admitting: Pulmonary Disease

## 2012-12-28 ENCOUNTER — Other Ambulatory Visit: Payer: Self-pay | Admitting: Pulmonary Disease

## 2013-01-06 ENCOUNTER — Other Ambulatory Visit: Payer: Self-pay | Admitting: Internal Medicine

## 2013-01-06 ENCOUNTER — Other Ambulatory Visit: Payer: Self-pay | Admitting: Pulmonary Disease

## 2013-01-07 ENCOUNTER — Encounter: Payer: Self-pay | Admitting: Cardiology

## 2013-01-17 ENCOUNTER — Other Ambulatory Visit: Payer: Self-pay | Admitting: Pulmonary Disease

## 2013-02-17 ENCOUNTER — Encounter: Payer: Self-pay | Admitting: Cardiology

## 2013-02-17 ENCOUNTER — Ambulatory Visit (INDEPENDENT_AMBULATORY_CARE_PROVIDER_SITE_OTHER): Payer: Medicare Other | Admitting: Cardiology

## 2013-02-17 VITALS — BP 148/64 | HR 72 | Ht 62.0 in | Wt 144.0 lb

## 2013-02-17 DIAGNOSIS — I4892 Unspecified atrial flutter: Secondary | ICD-10-CM

## 2013-02-17 NOTE — Progress Notes (Signed)
HPI The patient presents for followup of known coronary disease and atrial fib.  Since I last saw her she said both arthroscopic knee surgery and later last year ankle surgery. She did have atrial fibrillation following this. She wore a monitor in December placed by Dr. Christell Constant. This demonstrated sinus rhythm with infrequent. She might still have some palpitations in a week ago thinks her heart was out of rhythm for about 3 hours.  She felt a racing. She had a little neck discomfort. It stopped spontaneously she's not had this again. She's not had any presyncope or syncope. She still has some heel pain and is somewhat limited by this. She's not having any new shortness of breath, PND or orthopnea. She's had no weight gain or edema.   Allergies  Allergen Reactions  . Aspirin Other (See Comments)    REACTION: regular strength causes "heart to beat fast"  . Captopril Hypertension  . Naproxen Other (See Comments)    Tongue swelling  . Pantoprazole Sodium   . Penicillins     Swelling around site  . Sulfonamide Derivatives Hives    Current Outpatient Prescriptions  Medication Sig Dispense Refill  . acetaminophen (TYLENOL) 325 MG tablet Take 650 mg by mouth every 6 (six) hours as needed. For pain/fever      . albuterol (PROVENTIL HFA;VENTOLIN HFA) 108 (90 BASE) MCG/ACT inhaler Inhale 2 puffs into the lungs every 6 (six) hours as needed. For shortness of breath      . albuterol (PROVENTIL) (2.5 MG/3ML) 0.083% nebulizer solution Take 2.5 mg by nebulization 3 (three) times daily as needed. For breathing      . ALPRAZolam (XANAX) 0.5 MG tablet Take 1 mg by mouth at bedtime as needed. For sleep      . Ascorbic Acid (VITAMIN C PO) Take 1 tablet by mouth every morning.      Marland Kitchen aspirin 325 MG buffered tablet Take 1 tablet (325 mg total) by mouth daily.      . Calcium Carbonate-Vitamin D (CALCIUM 600+D) 600-400 MG-UNIT per tablet Take 1 tablet by mouth daily at 6 PM.       . cholecalciferol (VITAMIN D)  1000 UNITS tablet Take 1,000 Units by mouth 2 (two) times daily with breakfast and lunch.       . cyclobenzaprine (FLEXERIL) 10 MG tablet Take 10 mg by mouth 2 (two) times daily as needed. FOR PAIN      . dextromethorphan-guaiFENesin (MUCINEX DM) 30-600 MG per 12 hr tablet Take 1 tablet by mouth every 12 (twelve) hours. scheduled      . dicyclomine (BENTYL) 10 MG capsule Take 10-20 mg by mouth every 6 (six) hours as needed. For spasms      . fluticasone (FLONASE) 50 MCG/ACT nasal spray USE TWO SPRAYS EACH NOSTRIL DAILY  16 g  0  . isosorbide mononitrate (IMDUR) 60 MG 24 hr tablet Take 60 mg by mouth daily.      Marland Kitchen levothyroxine (SYNTHROID, LEVOTHROID) 25 MCG tablet Take 50 mcg by mouth daily.       . metoprolol (LOPRESSOR) 50 MG tablet Take 50 mg by mouth 2 (two) times daily.       . montelukast (SINGULAIR) 10 MG tablet Take 1 tablet (10 mg total) by mouth at bedtime.  30 tablet  6  . Multiple Vitamin (MULTIVITAMIN WITH MINERALS) TABS Take 1 tablet by mouth daily.      . nitroGLYCERIN (NITROSTAT) 0.4 MG SL tablet Place 0.4 mg under the tongue  every 5 (five) minutes as needed. For chest pain      . omega-3 acid ethyl esters (LOVAZA) 1 G capsule Take 1 g by mouth 2 (two) times daily.       Marland Kitchen omeprazole-sodium bicarbonate (ZEGERID) 40-1100 MG per capsule Take 1 capsule by mouth daily before breakfast.      . ondansetron (ZOFRAN) 4 MG tablet Take 4 mg by mouth every 12 (twelve) hours as needed. For nausea      . rosuvastatin (CRESTOR) 20 MG tablet Take 20 mg by mouth daily at 6 PM.       . senna (SENOKOT) 8.6 MG TABS Take 2 tablets (17.2 mg total) by mouth 2 (two) times daily.  120 each    . sertraline (ZOLOFT) 100 MG tablet Take 100 mg by mouth daily at 6 PM.       . Teriparatide, Recombinant, (FORTEO [place]) Inject into the skin daily.       Marland Kitchen VITAMIN E PO Take 1 capsule by mouth every morning.      . warfarin (COUMADIN) 5 MG tablet Take 5 mg by mouth as directed.       No current facility-administered  medications for this visit.    Past Medical History  Diagnosis Date  . GERD (gastroesophageal reflux disease)   . Anxiety disorder   . Arthritis   . CAD (coronary artery disease)     Cath May 2010.  Nonbstructive  . Depression   . Hypothyroid   . Asthmatic bronchitis   . Hypertension     dr Antoine Poche  . OA (osteoarthritis)   . Chronic bronchitis   . 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 Encephalitis     d/t meningitis  . PONV (postoperative nausea and vomiting)     history of cardiac arrest day 1 post surgery  in 2008    Past Surgical History  Procedure Laterality Date  . Total knee arthroplasty      right knee  . Back surgery    . Coronary angioplasty with stent placement  2003  . Sinus surgery with instatrak    . Knee arthroscopy  03/26/2012    Procedure: ARTHROSCOPY KNEE;  Surgeon: [name], MD;  Location: [facility] OR;  Service: Orthopedics;  Laterality: Left;  with Debridement of Lateral Meniscus tear  . Rotator cuff repair      bilateral  . Ankle fusion  08/27/2012    Procedure: ARTHRODESIS ANKLE;  Surgeon: [name], MD;  Location: [facility] OR;  Service: Orthopedics;  Laterality: Right;  Arthrodesis right ankle and subtalar joint    ROS:  As stated in the HPI and negative for all other systems.  PHYSICAL EXAM BP 148/64  Pulse 72  Ht 5\' 2"  (1.575 m)  Wt 144 lb (65.318 kg)  BMI 26.33 kg/m2 GENERAL:  Well appearing HEENT:  Pupils equal round and reactive, fundi not visualized, oral mucosa unremarkable NECK:  No jugular venous distention, waveform within normal limits, carotid upstroke brisk and symmetric, no bruits, no thyromegaly LYMPHATICS:  No cervical, inguinal adenopathy LUNGS:  Clear to auscultation bilaterally BACK:  No CVA tenderness CHEST:  Unremarkable HEART:  PMI not displaced or sustained,S1 and S2 within normal limits, no S3, no S4, no clicks, no rubs, no murmurs ABD:  Flat, positive bowel sounds normal in frequency in pitch, no  bruits, no rebound, no guarding, no midline pulsatile mass, no hepatomegaly, no splenomegaly EXT:  2 plus pulses throughout, no  edema, no cyanosis no clubbing, right ankle in a soft brace. SKIN:  No rashes no nodules NEURO:  Cranial nerves II through XII grossly intact, motor grossly intact throughout [facility]:  Cognitively intact, oriented to person place and time  EKG:  Sinus rhythm, rate 68, axis within normal limits, intervals within normal limits, no acute ST-T wave changes 02/17/2013  ASSESSMENT AND PLAN  CAD:  The patient has had no new symptoms since a negative stress perfusion study last week. She will continue with risk reduction. She will stop her ASA  ATRIAL FIBRILLATION:  She might be interested in switching to [place] or Eliquis and I think this would be reasonable we'll but she wants to check with the folks at [facility] warfarin clinic and with her insurance.  HTN:  Blood pressure is slightly elevated today but this is unusual. No change in therapy is indicated.

## 2013-02-17 NOTE — Patient Instructions (Addendum)
The current medical regimen is effective;  continue present plan and medications.  Follow up in 6 months with Dr Antoine Poche.  You will receive a letter in the mail 2 months before you are due.  Please call us when you receive this letter to schedule your follow up appointment.   Xarelto Eliquis

## 2013-02-22 ENCOUNTER — Other Ambulatory Visit: Payer: Self-pay | Admitting: Pulmonary Disease

## 2013-03-08 ENCOUNTER — Other Ambulatory Visit: Payer: Self-pay | Admitting: Pulmonary Disease

## 2013-03-08 MED ORDER — FLUTICASONE PROPIONATE 50 MCG/ACT NA SUSP: 2.0000 | Freq: Every day | NASAL | Status: DC

## 2013-03-15 ENCOUNTER — Other Ambulatory Visit: Payer: Self-pay | Admitting: Family Medicine

## 2013-03-17 ENCOUNTER — Encounter: Payer: Self-pay | Admitting: Family Medicine

## 2013-03-17 ENCOUNTER — Ambulatory Visit (INDEPENDENT_AMBULATORY_CARE_PROVIDER_SITE_OTHER): Payer: Medicare Other | Admitting: Family Medicine

## 2013-03-17 VITALS — BP 127/70 | HR 67 | Temp 97.9°F | Ht 60.5 in | Wt 144.0 lb

## 2013-03-17 DIAGNOSIS — H6120 Impacted cerumen, unspecified ear: Secondary | ICD-10-CM

## 2013-03-17 DIAGNOSIS — E785 Hyperlipidemia, unspecified: Secondary | ICD-10-CM

## 2013-03-17 DIAGNOSIS — H6122 Impacted cerumen, left ear: Secondary | ICD-10-CM

## 2013-03-17 DIAGNOSIS — I10 Essential (primary) hypertension: Secondary | ICD-10-CM

## 2013-03-17 DIAGNOSIS — I4891 Unspecified atrial fibrillation: Secondary | ICD-10-CM

## 2013-03-17 DIAGNOSIS — M2550 Pain in unspecified joint: Secondary | ICD-10-CM

## 2013-03-17 LAB — POCT INR: INR: 3

## 2013-03-17 NOTE — Progress Notes (Signed)
  Subjective:    Patient ID: [name], female    DOB: 11-Dec-1936, 77 y.o.   MRN: [id number]  HPI This patient presents for recheck of multiple medical problems. No one accompanies the patient today.  Patient Active Problem List  Diagnosis  . HYPOTHYROIDISM  . HYPERLIPIDEMIA  . DEPRESSION  . HYPERTENSION  . Cor athrscl-uns vessel  . ATRIAL FIBRILLATION, PAROXYSMAL  . Acute sinusitis, unspecified  . REACTIVE AIRWAY DISEASE  . ESOPHAGEAL STRICTURE  . ESOPHAGEAL MOTILITY DISORDER  . GERD  . HIATAL HERNIA  . CHEST PAIN  . NAUSEA  . Cough  . Acute bronchitis  . Preop cardiovascular exam    In addition, she just had ankle fusion right by Dr Victorino Dike  The allergies, current medications, past medical history, surgical history, family and social history are reviewed.  Immunizations reviewed.  Health maintenance reviewed.  The following items are outstanding: None     Review of Systems  HENT: Positive for hearing loss, congestion (slight) and postnasal drip (slight).   Eyes: Negative.   Respiratory: Positive for cough (slight) and wheezing (slight).   Cardiovascular: Negative.   Gastrointestinal: Positive for constipation (occasional).  Genitourinary: Positive for urgency.  Musculoskeletal: Positive for back pain (LBP), joint swelling (fingers, L knee) and arthralgias (L knee, R hand, shoulders R foot).  Neurological: Negative.   Psychiatric/Behavioral: The patient is nervous/anxious (due to family stressors).         Objective:   Physical Exam BP 127/70  Pulse 67  Temp(Src) 97.9 F (36.6 C) (Oral)  Ht 5' 0.5" (1.537 m)  Wt 144 lb (65.318 kg)  BMI 27.65 kg/m2  The patient appeared well nourished and normally developed, alert and oriented to time and place. Speech, behavior and judgement appear normal. Vital signs as documented.  Head exam is unremarkable. No scleral icterus or pallor noted. Left ear canal has cerumen and patient complains of decreased hearing.   Neck is without jugular venous distension, thyromegally, or carotid bruits. Carotid upstrokes are brisk bilaterally. No cervical adenopathy. Lungs are clear anteriorly and posteriorly to auscultation. Normal respiratory effort. Cardiac exam reveals regular rate and rhythm@ 60/min. First and second heart sounds normal. No murmurs, rubs or gallops.  Abdominal exam reveals normal bowl sounds, no masses, no organomegaly and no aortic enlargement. No inguinal adenopathy. Extremities are nonedematous and both femoral and pedal pulses are normal. Right ankle has decreased mobility minimal edema Skin without pallor or jaundice.  Warm and dry, without rash. Neurologic exam reveals normal deep tendon reflexes and normal sensation.         Assessment & Plan:  Status post right ankle fusion

## 2013-03-17 NOTE — Patient Instructions (Addendum)
Fall precautions discussed FOBT given Continue current meds and therapeutic lifestyle changes 

## 2013-03-22 ENCOUNTER — Telehealth: Payer: Self-pay | Admitting: Family Medicine

## 2013-03-22 MED ORDER — RIVAROXABAN 20 MG PO TABS: 20.0000 mg | ORAL_TABLET | Freq: Every day | ORAL | Status: DC

## 2013-03-22 NOTE — Telephone Encounter (Signed)
Patient to hold warfarin today and start xarelto 20mg  tomorrow 1 tablet in pm with food. Rx called to [facility] and pt notified. Told to monitor for s/s of bleeding and call office if experiences and bleeding or GI problems.

## 2013-03-24 ENCOUNTER — Other Ambulatory Visit: Payer: Self-pay | Admitting: Physician Assistant

## 2013-03-24 ENCOUNTER — Other Ambulatory Visit: Payer: Self-pay | Admitting: Family Medicine

## 2013-04-07 ENCOUNTER — Telehealth: Payer: Self-pay | Admitting: Family Medicine

## 2013-04-11 ENCOUNTER — Other Ambulatory Visit: Payer: Self-pay | Admitting: Family Medicine

## 2013-04-13 ENCOUNTER — Other Ambulatory Visit: Payer: Self-pay | Admitting: Nurse Practitioner

## 2013-04-15 ENCOUNTER — Encounter: Payer: Self-pay | Admitting: Family Medicine

## 2013-04-15 ENCOUNTER — Other Ambulatory Visit: Payer: Medicare Other

## 2013-04-15 ENCOUNTER — Ambulatory Visit (INDEPENDENT_AMBULATORY_CARE_PROVIDER_SITE_OTHER): Payer: Medicare Other | Admitting: Family Medicine

## 2013-04-15 VITALS — BP 130/75 | HR 76 | Temp 97.8°F | Ht 62.0 in | Wt 142.0 lb

## 2013-04-15 DIAGNOSIS — J45909 Unspecified asthma, uncomplicated: Secondary | ICD-10-CM

## 2013-04-15 DIAGNOSIS — E785 Hyperlipidemia, unspecified: Secondary | ICD-10-CM

## 2013-04-15 DIAGNOSIS — M2550 Pain in unspecified joint: Secondary | ICD-10-CM

## 2013-04-15 DIAGNOSIS — I10 Essential (primary) hypertension: Secondary | ICD-10-CM

## 2013-04-15 LAB — HEPATIC FUNCTION PANEL
ALT: 17 U/L (ref 0–35)
AST: 19 U/L (ref 0–37)
Albumin: 4 g/dL (ref 3.5–5.2)
Alkaline Phosphatase: 89 U/L (ref 39–117)
Bilirubin, Direct: 0.1 mg/dL (ref 0.0–0.3)
Indirect Bilirubin: 0.6 mg/dL (ref 0.0–0.9)
Total Bilirubin: 0.7 mg/dL (ref 0.3–1.2)
Total Protein: 7 g/dL (ref 6.0–8.3)

## 2013-04-15 LAB — BASIC METABOLIC PANEL WITH GFR
BUN: 11 mg/dL (ref 6–23)
CO2: 28 mEq/L (ref 19–32)
Calcium: 9.6 mg/dL (ref 8.4–10.5)
Chloride: 94 mEq/L — ABNORMAL LOW (ref 96–112)
Creat: 0.65 mg/dL (ref 0.50–1.10)
GFR, Est African American: >89 mL/min
GFR, Est Non African American: 87 mL/min
Glucose, Bld: 86 mg/dL (ref 70–99)
Potassium: 4.5 mEq/L (ref 3.5–5.3)
Sodium: 134 mEq/L — ABNORMAL LOW (ref 135–145)

## 2013-04-15 LAB — POCT CBC
Granulocyte percent: 73.4 %G (ref 37–80)
HCT, POC: 38.6 % (ref 37.7–47.9)
Hemoglobin: 13.3 g/dL (ref 12.2–16.2)
Lymph, poc: 1.8 (ref 0.6–3.4)
MCH, POC: 31.4 pg — AB (ref 27–31.2)
MCHC: 34.4 g/dL (ref 31.8–35.4)
MCV: 91.2 fL (ref 80–97)
MPV: 6.9 fL (ref 0–99.8)
POC Granulocyte: 5.3 (ref 2–6.9)
POC LYMPH PERCENT: 24.9 %L (ref 10–50)
Platelet Count, POC: 281 10*3/uL (ref 142–424)
RBC: 4.2 M/uL (ref 4.04–5.48)
RDW, POC: 12.6 %
WBC: 7.2 10*3/uL (ref 4.6–10.2)

## 2013-04-15 MED ORDER — METHYLPREDNISOLONE ACETATE 80 MG/ML IJ SUSP
60.0000 mg | Freq: Once | INTRAMUSCULAR | Status: AC
  Administered 2013-04-15: 60 mg via INTRAMUSCULAR

## 2013-04-15 MED ORDER — BUDESONIDE 0.5 MG/2ML IN SUSP: 0.2500 mg | Freq: Once | RESPIRATORY_TRACT | Status: DC

## 2013-04-15 MED ORDER — PREDNISONE 10 MG PO TABS: ORAL_TABLET | ORAL | Status: DC

## 2013-04-15 MED ORDER — AZITHROMYCIN 250 MG PO TABS: ORAL_TABLET | ORAL | Status: DC

## 2013-04-15 MED ORDER — BUDESONIDE 0.25 MG/2ML IN SUSP: 0.2500 mg | Freq: Two times a day (BID) | RESPIRATORY_TRACT | Status: DC

## 2013-04-15 MED ORDER — BUDESONIDE 0.25 MG/2ML IN SUSP
0.2500 mg | RESPIRATORY_TRACT | Status: AC
  Administered 2013-04-15: 0.25 mg via RESPIRATORY_TRACT

## 2013-04-15 MED ORDER — ALBUTEROL SULFATE (5 MG/ML) 0.5% IN NEBU: 2.5000 mg | INHALATION_SOLUTION | Freq: Once | RESPIRATORY_TRACT | Status: DC

## 2013-04-15 MED ORDER — ALBUTEROL SULFATE (2.5 MG/3ML) 0.083% IN NEBU
2.5000 mg | INHALATION_SOLUTION | RESPIRATORY_TRACT | Status: AC
  Administered 2013-04-15: 2.5 mg via RESPIRATORY_TRACT

## 2013-04-15 NOTE — Progress Notes (Signed)
  Subjective:    Patient ID: [name], female    DOB: [birth year], 77 y.o.   MRN: [id number]  HPI See. review of systems. She has had cough and congestion for 3-4 days. She was off of the albuterol nebulizer for about 4 days and this may have precipitated this cough and congestion. Sputum at time has a light green color. She is taking the mucinex maximum strength over-the-counter twice daily. She has also been out of this medicine for a couple of days.   Review of Systems  Constitutional: Negative for fever, chills and appetite change.  HENT: Positive for congestion and sneezing. Negative for sore throat and trouble swallowing.   Eyes: Positive for redness and itching.  Respiratory: Positive for cough and wheezing.   Cardiovascular: Negative.   Gastrointestinal: Positive for nausea.  Endocrine: Negative.   Genitourinary: Negative.   Musculoskeletal: Negative.   Skin: Negative.   Allergic/Immunologic: Negative.   Neurological: Positive for headaches. Negative for dizziness.  Hematological: Negative.   Psychiatric/Behavioral: Negative.        Objective:   Physical Exam  Nursing note and vitals reviewed. Constitutional: She is oriented to person, place, and time. She appears well-developed and well-nourished. No distress.  HENT:  Head: Normocephalic and atraumatic.  Right Ear: External ear normal.  Left Ear: External ear normal.  Nose: Nose normal.  Mouth/Throat: Oropharynx is clear and moist.  Neck: Normal range of motion. Neck supple. No thyromegaly present.  Cardiovascular: Normal rate, regular rhythm and normal heart sounds.   No murmur heard. Pulmonary/Chest: She is in respiratory distress. She has wheezes. She has no rales.  Bilateral inspiratory and expiratory wheezes  Musculoskeletal: Normal range of motion. She exhibits no edema.  Lymphadenopathy:    She has no cervical adenopathy.  Neurological: She is alert and oriented to person, place, and time.  Skin: Skin is  warm.  Psychiatric: She has a normal mood and affect. Her behavior is normal. Judgment and thought content normal.   After the breathing treatment with nebulizers the patient sounded much clearer and then treatment clearly helped.       Assessment & Plan:  Asthmatic bronchitis - Plan: predniSONE (DELTASONE) 10 MG tablet, azithromycin (ZITHROMAX) 250 MG tablet  Essential hypertension, benign  The nebulizer treatment here with albuterol and Pulmicort  Patient Instructions  Please use nebulizer regularly and as directed Use Pulmicort and albuterol in the morning and again in the evening, use albuterol the middle of the day Take Mucinex regularly twice daily with a large glass of water Drink plenty of fluids

## 2013-04-15 NOTE — Progress Notes (Signed)
Patient came in for labs only.

## 2013-04-15 NOTE — Patient Instructions (Addendum)
Please use nebulizer regularly and as directed Use Pulmicort and albuterol in the morning and again in the evening, use albuterol the middle of the day Take Mucinex regularly twice daily with a large glass of water Drink plenty of fluids

## 2013-04-16 ENCOUNTER — Other Ambulatory Visit: Payer: Medicare Other

## 2013-04-16 LAB — NMR LIPOPROFILE WITH LIPIDS
Cholesterol, Total: 139 mg/dL (ref <200)
HDL Particle Number: 31.7 umol/L (ref >=30.5)
HDL Size: 9.3 nm (ref >=9.2)
HDL-C: 54 mg/dL (ref >=40)
LDL (calc): 63 mg/dL (ref <100)
LDL Particle Number: 923 nmol/L (ref <1000)
LDL Size: 20.4 nm — ABNORMAL LOW (ref >20.5)
LP-IR Score: 25 (ref <=45)
Large HDL-P: 7.7 umol/L (ref >=4.8)
Large VLDL-P: <0.8 nmol/L (ref <=2.7)
Small LDL Particle Number: 504 nmol/L (ref <=527)
Triglycerides: 110 mg/dL (ref <150)
VLDL Size: 45.8 nm (ref <=46.6)

## 2013-04-16 LAB — VITAMIN D 25 HYDROXY (VIT D DEFICIENCY, FRACTURES): Vit D, 25-Hydroxy: 43 ng/mL (ref 30–89)

## 2013-04-19 ENCOUNTER — Telehealth: Payer: Self-pay | Admitting: *Deleted

## 2013-04-19 ENCOUNTER — Other Ambulatory Visit: Payer: Self-pay | Admitting: *Deleted

## 2013-04-19 MED ORDER — ALBUTEROL SULFATE (2.5 MG/3ML) 0.083% IN NEBU: 2.5000 mg | INHALATION_SOLUTION | Freq: Four times a day (QID) | RESPIRATORY_TRACT | Status: DC

## 2013-04-19 MED ORDER — BUDESONIDE 0.25 MG/2ML IN SUSP: 0.2500 mg | Freq: Two times a day (BID) | RESPIRATORY_TRACT | Status: DC

## 2013-04-19 NOTE — Telephone Encounter (Signed)
Message copied by Bearl Mulberry on Mon Apr 19, 2013 12:54 PM ------      Message from: Ernestina Penna      Created: Fri Apr 16, 2013 10:20 AM       Sodium and chloride are slightly decreased, potassium normal. Creatinine is good and blood sugar is good.      All liver function tests within normal limits.      Vitamin D is good      On advanced lipid testing, a total LDL particle number is at goal of less than 1000 at 923. Also triglycerides and HDL P are good ------

## 2013-04-19 NOTE — Telephone Encounter (Signed)
Pt notified of lab results Also called [facility] pharmacy

## 2013-04-20 NOTE — Telephone Encounter (Signed)
This has been taken care of.

## 2013-04-22 ENCOUNTER — Other Ambulatory Visit: Payer: Self-pay | Admitting: Family Medicine

## 2013-04-29 ENCOUNTER — Ambulatory Visit (INDEPENDENT_AMBULATORY_CARE_PROVIDER_SITE_OTHER): Payer: Medicare Other | Admitting: Family Medicine

## 2013-04-29 ENCOUNTER — Encounter: Payer: Self-pay | Admitting: Family Medicine

## 2013-04-29 VITALS — BP 117/78 | HR 69 | Temp 98.4°F | Ht 62.0 in | Wt 146.6 lb

## 2013-04-29 DIAGNOSIS — J45909 Unspecified asthma, uncomplicated: Secondary | ICD-10-CM

## 2013-04-29 DIAGNOSIS — B370 Candidal stomatitis: Secondary | ICD-10-CM

## 2013-04-29 MED ORDER — NYSTATIN 100000 UNIT/ML MT SUSP: 500000.0000 [IU] | Freq: Four times a day (QID) | OROMUCOSAL | Status: DC

## 2013-04-29 MED ORDER — ALBUTEROL SULFATE HFA 108 (90 BASE) MCG/ACT IN AERS: 2.0000 | INHALATION_SPRAY | Freq: Four times a day (QID) | RESPIRATORY_TRACT | Status: DC | PRN

## 2013-04-29 NOTE — Patient Instructions (Addendum)
Continue to use Mucinex twice daily with a large glass of water For the next 2 weeks use albuterol and Pulmicort in the nebulizer morning and night For the 2 weeks after that used albuterol and Pulmicort just at nighttime in the nebulizer For the next 4 weeks you may use the albuterol inhaler when you're not doing the nebulizer with the albuterol in it

## 2013-04-29 NOTE — Progress Notes (Signed)
  Subjective:    Patient ID: [name], female    DOB: [birth year], 77 y.o.   MRN: [id number]  HPI  patient is doing better with cough and congestion and wheezing. She does complain of a sore mouth. She is still using the nebulizer treatments but only twice daily with both albuterol and Pulmicort. She is also still using Mucinex twice daily   Review of Systems  Constitutional: Negative for fever.  HENT: Positive for congestion and postnasal drip. Negative for ear pain and sore throat.   Respiratory: Positive for cough (occasional productive) and shortness of breath (improving). Wheezing: improving.        Objective:   Physical Exam  Vitals reviewed. Constitutional: She appears well-developed and well-nourished. No distress.  HENT:  Head: Normocephalic and atraumatic.  Right Ear: External ear normal.  Left Ear: External ear normal.  Nose: Nose normal.  Mouth/Throat: No oropharyngeal exudate.  Some dryness in the mouth but no sign of any obvious yeast infection  Eyes: Conjunctivae are normal.  Neck: Normal range of motion. Neck supple. No thyromegaly present.  Cardiovascular: Normal rate, regular rhythm and normal heart sounds.   No murmur heard. Pulmonary/Chest: Effort normal and breath sounds normal. No respiratory distress. She has no wheezes. She has no rales.  Dry cough  Lymphadenopathy:    She has no cervical adenopathy.          Assessment & Plan:  1. Asthmatic bronchitis improving - nystatin (MYCOSTATIN) 100000 UNIT/ML suspension; Take 5 mLs (500,000 Units total) by mouth 4 (four) times daily.  Dispense: 60 mL; Refill: 0 - albuterol (PROVENTIL HFA;VENTOLIN HFA) 108 (90 BASE) MCG/ACT inhaler; Inhale 2 puffs into the lungs every 6 (six) hours as needed for wheezing.  Dispense: 1 Inhaler; Refill: 0  2. Thrush - nystatin (MYCOSTATIN) 100000 UNIT/ML suspension; Take 5 mLs (500,000 Units total) by mouth 4 (four) times daily.  Dispense: 60 mL; Refill: 0  Patient  Instructions  Continue to use Mucinex twice daily with a large glass of water For the next 2 weeks use albuterol and Pulmicort in the nebulizer morning and night For the 2 weeks after that used albuterol and Pulmicort just at nighttime in the nebulizer For the next 4 weeks you may use the albuterol inhaler when you're not doing the nebulizer with the albuterol in it

## 2013-05-03 ENCOUNTER — Ambulatory Visit: Payer: Medicare Other | Admitting: Family Medicine

## 2013-05-07 ENCOUNTER — Other Ambulatory Visit: Payer: Self-pay | Admitting: Family Medicine

## 2013-05-11 ENCOUNTER — Other Ambulatory Visit: Payer: Self-pay | Admitting: Family Medicine

## 2013-05-14 ENCOUNTER — Telehealth: Payer: Self-pay | Admitting: Family Medicine

## 2013-05-14 NOTE — Telephone Encounter (Signed)
appt given  

## 2013-05-15 ENCOUNTER — Ambulatory Visit (INDEPENDENT_AMBULATORY_CARE_PROVIDER_SITE_OTHER): Payer: Medicare Other | Admitting: Nurse Practitioner

## 2013-05-15 VITALS — BP 139/62 | HR 70 | Temp 96.7°F | Ht 62.0 in | Wt 146.0 lb

## 2013-05-15 DIAGNOSIS — N390 Urinary tract infection, site not specified: Secondary | ICD-10-CM

## 2013-05-15 DIAGNOSIS — R300 Dysuria: Secondary | ICD-10-CM

## 2013-05-15 LAB — POCT URINALYSIS DIPSTICK
Bilirubin, UA: NEGATIVE
Glucose, UA: NEGATIVE
Nitrite, UA: POSITIVE
Protein, UA: NEGATIVE
Spec Grav, UA: <=1.005
Urobilinogen, UA: NEGATIVE
pH, UA: 5

## 2013-05-15 LAB — POCT UA - MICROSCOPIC ONLY
Casts, Ur, LPF, POC: NEGATIVE
Crystals, Ur, HPF, POC: NEGATIVE
Epithelial cells, urine per micros: NEGATIVE
Mucus, UA: NEGATIVE
Yeast, UA: NEGATIVE

## 2013-05-15 MED ORDER — CIPROFLOXACIN HCL 500 MG PO TABS: 500.0000 mg | ORAL_TABLET | Freq: Two times a day (BID) | ORAL | Status: DC

## 2013-05-15 NOTE — Patient Instructions (Signed)
Urinary Tract Infection  Urinary tract infections (UTIs) can develop anywhere along your urinary tract. Your urinary tract is your body's drainage system for removing wastes and extra water. Your urinary tract includes two kidneys, two ureters, a bladder, and a urethra. Your kidneys are a pair of bean-shaped organs. Each kidney is about the size of your fist. They are located below your ribs, one on each side of your spine.  CAUSES  Infections are caused by microbes, which are microscopic organisms, including fungi, viruses, and bacteria. These organisms are so small that they can only be seen through a microscope. Bacteria are the microbes that most commonly cause UTIs.  SYMPTOMS   Symptoms of UTIs may vary by age and gender of the patient and by the location of the infection. Symptoms in young women typically include a frequent and intense urge to urinate and a painful, burning feeling in the bladder or urethra during urination. Older women and men are more likely to be tired, shaky, and weak and have muscle aches and abdominal pain. A fever may mean the infection is in your kidneys. Other symptoms of a kidney infection include pain in your back or sides below the ribs, nausea, and vomiting.  DIAGNOSIS  To diagnose a UTI, your caregiver will ask you about your symptoms. Your caregiver also will ask to provide a urine sample. The urine sample will be tested for bacteria and white blood cells. White blood cells are made by your body to help fight infection.  TREATMENT   Typically, UTIs can be treated with medication. Because most UTIs are caused by a bacterial infection, they usually can be treated with the use of antibiotics. The choice of antibiotic and length of treatment depend on your symptoms and the type of bacteria causing your infection.  HOME CARE INSTRUCTIONS   If you were prescribed antibiotics, take them exactly as your caregiver instructs you. Finish the medication even if you feel better after you  have only taken some of the medication.   Drink enough water and fluids to keep your urine clear or pale yellow.   Avoid caffeine, tea, and carbonated beverages. They tend to irritate your bladder.   Empty your bladder often. Avoid holding urine for long periods of time.   Empty your bladder before and after sexual intercourse.   After a bowel movement, women should cleanse from front to back. Use each tissue only once.  SEEK MEDICAL CARE IF:    You have back pain.   You develop a fever.   Your symptoms do not begin to resolve within 3 days.  SEEK IMMEDIATE MEDICAL CARE IF:    You have severe back pain or lower abdominal pain.   You develop chills.   You have nausea or vomiting.   You have continued burning or discomfort with urination.  MAKE SURE YOU:    Understand these instructions.   Will watch your condition.   Will get help right away if you are not doing well or get worse.  Document Released: 09/11/2005 Document Revised: 06/02/2012 Document Reviewed: 01/10/2012  [employer] Patient Information 2014 [employer]

## 2013-05-15 NOTE — Progress Notes (Signed)
  Subjective:    Patient ID: [name], female    DOB: Jul 14, 1936, 77 y.o.   MRN: [id number]  HPI  Patient in c/o dysuria and urinary frequency for several days- Nocturis 2-3X at night.    Review of Systems  Constitutional: Negative for fever and chills.  Respiratory: Negative.   Cardiovascular: Negative.   Gastrointestinal: Negative.   Genitourinary: Positive for dysuria, urgency, hematuria and flank pain.  Musculoskeletal: Positive for back pain (lower).       Objective:   Physical Exam  Constitutional: She is oriented to person, place, and time. She appears well-developed and well-nourished.  Cardiovascular: Normal rate and normal heart sounds.   Pulmonary/Chest: Effort normal and breath sounds normal.  Abdominal:  Suprapubic pressure on palpation  Genitourinary:  Mild bil CVA tenderness   Neurological: She is alert and oriented to person, place, and time.     BP 139/62  Pulse 70  Temp(Src) 96.7 F (35.9 C) (Oral)  Ht 5\' 2"  (1.575 m)  Wt 146 lb (66.225 kg)  BMI 26.7 kg/m2      Assessment & Plan:   1. Dysuria   2. UTI (urinary tract infection)    Meds ordered this encounter  Medications  . ciprofloxacin (CIPRO) 500 MG tablet    Sig: Take 1 tablet (500 mg total) by mouth 2 (two) times daily.    Dispense:  14 tablet    Refill:  0    Order Specific Question:  Supervising Provider    Answer:  Deborra Medina   Force fluids AZO over the counter X2 days RTO prn Culture pending  Mary-Margaret Daphine Deutscher, FNP

## 2013-05-19 ENCOUNTER — Other Ambulatory Visit: Payer: Self-pay | Admitting: Nurse Practitioner

## 2013-05-19 DIAGNOSIS — N390 Urinary tract infection, site not specified: Secondary | ICD-10-CM

## 2013-05-19 LAB — URINE CULTURE: Colony Count: >=100000

## 2013-05-19 MED ORDER — CEFTRIAXONE SODIUM 1 G IJ SOLR
1.0000 g | INTRAMUSCULAR | Status: AC
  Administered 2013-05-20: 1 g via INTRAMUSCULAR

## 2013-05-20 ENCOUNTER — Other Ambulatory Visit: Payer: Self-pay | Admitting: Nurse Practitioner

## 2013-05-20 ENCOUNTER — Telehealth: Payer: Self-pay | Admitting: Nurse Practitioner

## 2013-05-20 ENCOUNTER — Ambulatory Visit (INDEPENDENT_AMBULATORY_CARE_PROVIDER_SITE_OTHER): Payer: Medicare Other | Admitting: *Deleted

## 2013-05-20 DIAGNOSIS — N390 Urinary tract infection, site not specified: Secondary | ICD-10-CM

## 2013-05-20 NOTE — Telephone Encounter (Signed)
DONE, PT CAME BY OFFICE TODAY

## 2013-05-22 ENCOUNTER — Other Ambulatory Visit: Payer: Self-pay | Admitting: Family Medicine

## 2013-05-25 ENCOUNTER — Other Ambulatory Visit: Payer: Self-pay | Admitting: Family Medicine

## 2013-05-27 ENCOUNTER — Other Ambulatory Visit: Payer: Self-pay | Admitting: Orthopedic Surgery

## 2013-05-27 DIAGNOSIS — M79671 Pain in right foot: Secondary | ICD-10-CM

## 2013-06-02 ENCOUNTER — Other Ambulatory Visit: Payer: Medicare Other

## 2013-06-03 ENCOUNTER — Other Ambulatory Visit: Payer: Self-pay | Admitting: Family Medicine

## 2013-06-03 ENCOUNTER — Ambulatory Visit
Admission: RE | Admit: 2013-06-03 | Discharge: 2013-06-03 | Disposition: A | Discharge status: Home / Self Care | Payer: Medicare Other | Source: Ambulatory Visit | Attending: Orthopedic Surgery | Admitting: Orthopedic Surgery

## 2013-06-03 DIAGNOSIS — M79671 Pain in right foot: Secondary | ICD-10-CM

## 2013-06-08 ENCOUNTER — Telehealth: Payer: Self-pay | Admitting: Family Medicine

## 2013-06-09 ENCOUNTER — Other Ambulatory Visit: Payer: Self-pay | Admitting: Family Medicine

## 2013-06-09 NOTE — Telephone Encounter (Signed)
Done 06/09/13

## 2013-06-17 ENCOUNTER — Telehealth: Payer: Self-pay | Admitting: Family Medicine

## 2013-06-22 NOTE — Telephone Encounter (Signed)
Spoke with pt and she stated has appt with her pulmonologist on Thursday July 10,2014. Informed pt to call prn the patient verbalized understanding

## 2013-06-22 NOTE — Telephone Encounter (Signed)
Take mucinex DM otc and follow up if not better.

## 2013-06-24 ENCOUNTER — Encounter: Payer: Self-pay | Admitting: Pulmonary Disease

## 2013-06-24 ENCOUNTER — Ambulatory Visit (INDEPENDENT_AMBULATORY_CARE_PROVIDER_SITE_OTHER): Payer: Medicare Other | Admitting: Pulmonary Disease

## 2013-06-24 VITALS — BP 110/78 | HR 74 | Temp 98.0°F | Ht 62.0 in | Wt 150.0 lb

## 2013-06-24 DIAGNOSIS — J209 Acute bronchitis, unspecified: Secondary | ICD-10-CM

## 2013-06-24 DIAGNOSIS — J45909 Unspecified asthma, uncomplicated: Secondary | ICD-10-CM

## 2013-06-24 MED ORDER — CEFDINIR 300 MG PO CAPS: 300.0000 mg | ORAL_CAPSULE | Freq: Two times a day (BID) | ORAL | Status: DC

## 2013-06-24 NOTE — Patient Instructions (Addendum)
Lung function test is good Omnicef x 7days Trial of budesonide inhaler instead of neb (this is MAINTENANCE medication) Use albuterol nebs only for rescue when sick

## 2013-06-24 NOTE — Progress Notes (Signed)
  Subjective:    Patient ID: [name], female    DOB: [birth year], 77 y.o.   MRN: [id number]  HPI 77/F ex smoker with reactive airway disease - ? GERD vs sinusitis, nml pFTs  She smoked a PPD x 20-25 yrs before quitting in 1990. She develops recurrent chest colds requiring several rounds of Abx  to get better.   Data: SHe underwent esophageal dilation for hypertensive LES (Dr Juanda Chance) & is on protonix two times a day . Evaluation for chest pain in 6/10 (Dr Antoine Poche) has shown non obstructive CAD - attributed to esophageal spasm.Sucralfate added by GI to zegerid .   Took macrodantin x 2 yrs for UTIs , stopped '10  Prednisone makes her hyper and nervous. Has osteoporosis with fractures.  PFTs nml '11 .  RAST - IgE 225 -high but no sensitivity to common indoor/ outdoor allergens  Underwent endoscopic sinus surgery 1/12 by dr Pollyann Kennedy  Treated x 6 wks with clinda for chronic sinusitis, started wheezing again when ABx stopped    06/24/2013 2 y FU - 2-3 zpaks called in - last 5/13 Required abx in may 2014 Reconstructive surgery of RLE  Pt c/o bronchitis x 2 weeks. C/o cough w/ yellow-green tint, wheezing, chest tx, increase SOB. Taken mucinex, budesonide & albuterol nebs without relief   [employer] put her on a preventive program, told her to use a netti pot & albuterol nebs once daily - she questions the need, also wonders if budesonide needed. I note that symbicort was stopped 2012 CXR 4/13 nml Spirometry -no obstruction    Past Medical History  Diagnosis Date  . GERD (gastroesophageal reflux disease)   . Anxiety disorder   . Arthritis   . CAD (coronary artery disease)     Cath May 2010.  Nonbstructive  . Depression   . Hypothyroid   . Asthmatic bronchitis   . Hypertension     dr Antoine Poche  . OA (osteoarthritis)   . Chronic bronchitis   . 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 Encephalitis     d/t meningitis  . PONV (postoperative nausea and  vomiting)     history of cardiac arrest day 1 post surgery  in 2008    Review of Systems  neg for any significant sore throat, dysphagia, itching, sneezing, nasal congestion or excess/ purulent secretions, fever, chills, sweats, unintended wt loss, pleuritic or exertional cp, hempoptysis, orthopnea pnd or change in chronic leg swelling. Also denies presyncope, palpitations, heartburn, abdominal pain, nausea, vomiting, diarrhea or change in bowel or urinary habits, dysuria,hematuria, rash, arthralgias, visual complaints, headache, numbness weakness or ataxia.     Objective:   Physical Exam  Gen. Pleasant, well-nourished, in no distress ENT - no lesions, no post nasal drip Neck: No JVD, no thyromegaly, no carotid bruits Lungs: no use of accessory muscles, no dullness to percussion, clear without rales or rhonchi  Cardiovascular: Rhythm regular, heart sounds  normal, no murmurs or gallops, no peripheral edema Musculoskeletal: No deformities, no cyanosis or clubbing        Assessment & Plan:

## 2013-06-25 NOTE — Assessment & Plan Note (Signed)
Triggers being GERD & sinusitis No obstruction on spirometry 6/14 when symptomatic is reassuring   Omnicef x 7days Trial of pulmicort inhaler instead of neb (this is MAINTENANCE medication) tor educe her neb burdeb- at the same time doubt she can use MDI effectively Use albuterol nebs only for rescue when sick

## 2013-06-25 NOTE — Assessment & Plan Note (Signed)
omnicef x 7ds

## 2013-07-05 ENCOUNTER — Other Ambulatory Visit: Payer: Self-pay | Admitting: Family Medicine

## 2013-07-06 ENCOUNTER — Other Ambulatory Visit: Payer: Self-pay | Admitting: Cardiology

## 2013-07-06 ENCOUNTER — Telehealth: Payer: Self-pay | Admitting: Pharmacist Clinician (PhC)/ Clinical Pharmacy Specialist

## 2013-07-06 NOTE — Telephone Encounter (Signed)
Called Mrs. Mascari and informed her that the maximum time approved for Forteo treatment is two years and that often we will do a dexascan 1 year into treatment to assess its effectiveness.  I don't have a start date on patient's forteo but asked her to call me back and I will also see if I can locate a start date in her paper chart.

## 2013-07-13 ENCOUNTER — Telehealth: Payer: Self-pay | Admitting: Pulmonary Disease

## 2013-07-13 MED ORDER — PREDNISONE 10 MG PO TABS: ORAL_TABLET | ORAL | Status: DC

## 2013-07-13 NOTE — Telephone Encounter (Signed)
Pt aware of recs. RX has been sent. Nothing further was needed

## 2013-07-13 NOTE — Telephone Encounter (Signed)
I spoke with [name]. She c/o chest tx, wheezing, hoarseness, cough w/ light yellow-clear phlem, PND, nasal congestion since last OV 06/24/13. Per [name] she finished ABX is not any better. [name] states she has no transportation to come in for OV. She is requesting to have something called in. Please advise Dr. Vassie Loll thanks  Allergies  Allergen Reactions  . Aspirin Other (See Comments)    REACTION: regular strength causes "heart to beat fast"  . Captopril Hypertension  . Naproxen Other (See Comments)    Tongue swelling  . Penicillins     Swelling around site  . Sulfonamide Derivatives Hives  . Clindamycin/Lincomycin

## 2013-07-13 NOTE — Telephone Encounter (Signed)
Prednisone 10 mg tabs  Take 2 tabs daily with food x 5ds, then 1 tab daily with food x 5ds then STOP  

## 2013-07-20 ENCOUNTER — Ambulatory Visit (INDEPENDENT_AMBULATORY_CARE_PROVIDER_SITE_OTHER): Payer: Medicare Other | Admitting: Pulmonary Disease

## 2013-07-20 ENCOUNTER — Encounter: Payer: Self-pay | Admitting: Pulmonary Disease

## 2013-07-20 ENCOUNTER — Telehealth: Payer: Self-pay | Admitting: Pulmonary Disease

## 2013-07-20 ENCOUNTER — Other Ambulatory Visit: Payer: Self-pay | Admitting: Family Medicine

## 2013-07-20 VITALS — BP 124/78 | HR 77 | Temp 98.7°F | Ht 62.0 in | Wt 148.8 lb

## 2013-07-20 DIAGNOSIS — J45909 Unspecified asthma, uncomplicated: Secondary | ICD-10-CM

## 2013-07-20 DIAGNOSIS — J209 Acute bronchitis, unspecified: Secondary | ICD-10-CM

## 2013-07-20 MED ORDER — DOXYCYCLINE HYCLATE 100 MG PO TABS: 100.0000 mg | ORAL_TABLET | Freq: Two times a day (BID) | ORAL | Status: DC

## 2013-07-20 NOTE — Progress Notes (Signed)
  Subjective:    Patient ID: [name], female    DOB: 06-Jul-1936, 77 y.o.   MRN: [id number]  HPI  77/F remote ex smoker with reactive airway disease - ? GERD vs sinusitis, nml pFTs  She smoked a PPD x 20-25 yrs before quitting in 1990. She develops recurrent chest colds requiring several rounds of Abx to get better.  Data:  SHe underwent esophageal dilation for hypertensive LES (Dr Juanda Chance) & is on protonix two times a day . Evaluation for chest pain in 6/10 (Dr Antoine Poche) has shown non obstructive CAD - attributed to esophageal spasm.Sucralfate added by GI to zegerid .  Took macrodantin x 2 yrs for UTIs , stopped '10  Prednisone makes her hyper and nervous. Has osteoporosis with fractures.  PFTs nml '11 .  RAST - IgE 225 -high but no sensitivity to common indoor/ outdoor allergens  Underwent endoscopic sinus surgery 1/12 by dr Pollyann Kennedy  Treated x 6 wks with clinda for chronic sinusitis, started wheezing again when ABx stopped   06/24/2013  2 y FU -  Required abx in may 2014  Reconstructive surgery of RLE  Pt c/o bronchitis x 2 weeks. C/o cough w/ yellow-green tint, wheezing, chest tx, increase SOB.  Taken mucinex, budesonide & albuterol nebs without relief  [employer] put her on a preventive program, told her to use a netti pot & albuterol nebs once daily - she questions the need, also wonders if budesonide needed. I note that symbicort was stopped 2012  CXR 4/13 nml  Spirometry -no obstruction   >>omnicef, pulmicort MDI  07/20/2013  Pt c/o cough w/ yellow phlem, wheezing, and chest tx, hoarseness, PND, nasal congestion slightly (using netti pot). Pt states this as been going on x last OV in July. Pt will finish prednisone today Off budesonide x 1 week, stopped pulmicort herself due to side effects Takes loratidine daily GERD controlled on meds Hoarseness & cough -main issues   Review of Systems neg for any significant sore throat, dysphagia, itching, sneezing, nasal congestion or  excess/ purulent secretions, fever, chills, sweats, unintended wt loss, pleuritic or exertional cp, hempoptysis, orthopnea pnd or change in chronic leg swelling. Also denies presyncope, palpitations, heartburn, abdominal pain, nausea, vomiting, diarrhea or change in bowel or urinary habits, dysuria,hematuria, rash, arthralgias, visual complaints, headache, numbness weakness or ataxia.     Objective:   Physical Exam  Gen. Pleasant, well-nourished, in no distress ENT - no lesions, no post nasal drip, upper airway pseudowheeze Neck: No JVD, no thyromegaly, no carotid bruits Lungs: no use of accessory muscles, no dullness to percussion, clear without rales or rhonchi  Cardiovascular: Rhythm regular, heart sounds  normal, no murmurs or gallops, no peripheral edema Musculoskeletal: No deformities, no cyanosis or clubbing         Assessment & Plan:

## 2013-07-20 NOTE — Telephone Encounter (Signed)
I spoke with pt. She has been like since 06/24/13. Pt is scheduled to come in this afternoon at 3:15 in [place] to see RA. Pt aware of directions/address. Nothing further needed

## 2013-07-20 NOTE — Patient Instructions (Addendum)
Take albuterol nebs twice daily Take ZYRTEC daily for allergies Antibiotic x 10 days (doxy )

## 2013-07-21 ENCOUNTER — Telehealth: Payer: Self-pay | Admitting: Pharmacist

## 2013-07-21 ENCOUNTER — Telehealth: Payer: Self-pay | Admitting: Pulmonary Disease

## 2013-07-21 NOTE — Assessment & Plan Note (Signed)
Triggers being GERD & sinusitis No obstruction on spirometry 6/14 when symptomatic is reassuring

## 2013-07-21 NOTE — Telephone Encounter (Signed)
I spoke with pt. She stated the medication she could not remember was loratadine QD. She wants to know if she still needs to buy the zyrtec or change any of her other meds. Please advise RA thanks

## 2013-07-21 NOTE — Assessment & Plan Note (Signed)
Take albuterol nebs twice daily Take ZYRTEC daily for allergies Antibiotic x 10 days (doxy ) If persistent hoarseness, consider ENt eval

## 2013-07-21 NOTE — Telephone Encounter (Signed)
No - loratidine ok instead of zyrtec

## 2013-07-21 NOTE — Telephone Encounter (Signed)
Spoke with patient, made her aware of recs as listed below per Dr. Vassie Loll Patient verbalized understanding at this time Nothing further at this time

## 2013-07-21 NOTE — Telephone Encounter (Signed)
Patient has been taking Forteo 2 years this month.  Recommended she finish up the Forteo she has.   She has an appt with Dr Christell Constant on 08/09/13. Will see when last Dexa was and will try to schedule a follow up DEXA and discuss options for osteoporosis.

## 2013-08-04 ENCOUNTER — Other Ambulatory Visit: Payer: Self-pay | Admitting: Family Medicine

## 2013-08-09 ENCOUNTER — Encounter: Payer: Self-pay | Admitting: Family Medicine

## 2013-08-09 ENCOUNTER — Other Ambulatory Visit: Payer: Self-pay

## 2013-08-09 ENCOUNTER — Ambulatory Visit (INDEPENDENT_AMBULATORY_CARE_PROVIDER_SITE_OTHER): Payer: Medicare Other

## 2013-08-09 ENCOUNTER — Ambulatory Visit (INDEPENDENT_AMBULATORY_CARE_PROVIDER_SITE_OTHER): Payer: Medicare Other | Admitting: Family Medicine

## 2013-08-09 VITALS — BP 131/70 | HR 70 | Temp 98.1°F | Ht 62.0 in | Wt 149.6 lb

## 2013-08-09 DIAGNOSIS — M25571 Pain in right ankle and joints of right foot: Secondary | ICD-10-CM

## 2013-08-09 DIAGNOSIS — R0602 Shortness of breath: Secondary | ICD-10-CM

## 2013-08-09 DIAGNOSIS — E785 Hyperlipidemia, unspecified: Secondary | ICD-10-CM

## 2013-08-09 DIAGNOSIS — M797 Fibromyalgia: Secondary | ICD-10-CM

## 2013-08-09 DIAGNOSIS — K219 Gastro-esophageal reflux disease without esophagitis: Secondary | ICD-10-CM

## 2013-08-09 DIAGNOSIS — R05 Cough: Secondary | ICD-10-CM

## 2013-08-09 DIAGNOSIS — R413 Other amnesia: Secondary | ICD-10-CM

## 2013-08-09 DIAGNOSIS — I4891 Unspecified atrial fibrillation: Secondary | ICD-10-CM

## 2013-08-09 DIAGNOSIS — IMO0001 Reserved for inherently not codable concepts without codable children: Secondary | ICD-10-CM

## 2013-08-09 DIAGNOSIS — M25579 Pain in unspecified ankle and joints of unspecified foot: Secondary | ICD-10-CM

## 2013-08-09 DIAGNOSIS — I10 Essential (primary) hypertension: Secondary | ICD-10-CM

## 2013-08-09 NOTE — Progress Notes (Signed)
Subjective:    Patient ID: [name], female    DOB: 1936-01-07, 77 y.o.   MRN: [id number]  HPI Patient returns to clinic today for followup and management of chronic medical problems. Also see the review of systems. She still has fatigue and myalgias and arthralgias. She is having upper airway congestion and drainage with cough. Patient notes that she fell moving a chair and received a contusion to her right arm and her left leg. She is not sure what caused her to fall. Patient says she thinks that she has dementia. She cannot remember names well.. she is also complaining with her ankle and fearing that she may have to have repeat surgery on the ankle.  Review of Systems  Constitutional: Positive for fatigue. Negative for activity change and appetite change.  HENT: Positive for congestion, voice change (continued hoarseness) and postnasal drip. Negative for ear pain, sore throat and sneezing.   Eyes: Negative.  Negative for pain, discharge, redness, itching and visual disturbance.  Respiratory: Positive for cough (worse at night,occasionally prod), shortness of breath (with exertion) and wheezing.   Cardiovascular: Negative.  Negative for chest pain, palpitations and leg swelling.  Gastrointestinal: Positive for nausea (occasional). Negative for vomiting, abdominal pain, diarrhea, constipation and abdominal distention.  Endocrine: Negative.  Negative for cold intolerance, heat intolerance, polydipsia, polyphagia and polyuria.  Genitourinary: Negative.  Negative for dysuria, urgency, frequency, vaginal bleeding and vaginal discharge.  Musculoskeletal: Positive for myalgias (all over), back pain (LBP) and arthralgias (all over, fibromyalgia).  Skin: Negative.  Negative for color change, pallor, rash and wound.  Allergic/Immunologic: Positive for environmental allergies (seasonal).  Neurological: Positive for weakness and light-headedness. Negative for dizziness, tremors, numbness and headaches.   Psychiatric/Behavioral: Positive for decreased concentration (slight memory deficit). Negative for confusion, sleep disturbance and agitation. The patient is nervous/anxious (some depression).        Objective:   Physical Exam BP 131/70  Pulse 70  Temp(Src) 98.1 F (36.7 C) (Oral)  Ht 5\' 2"  (1.575 m)  Wt 149 lb 9.6 oz (67.858 kg)  BMI 27.36 kg/m2  The patient appeared well nourished and normally developed for her age, alert and oriented to time and place. Speech, behavior and judgement appear normal. Vital signs as documented.  Head exam is unremarkable. No scleral icterus or pallor noted. Nasal pallor. Mouth and throat and ears were normal. Neck is without jugular venous distension, thyromegally, or carotid bruits. Carotid upstrokes are brisk bilaterally. No cervical adenopathy. Lungs are clear anteriorly and posteriorly to auscultation. Normal respiratory effort. She did have a dry cough. There were no rales or wheezes. Cardiac exam reveals regular rate and rhythm at 72 per minute. First and second heart sounds normal.  No murmurs, rubs or gallops. Breast exam today revealed no masses tenderness or axillary adenopathy. The breasts have a symmetrical appearance.  Abdominal exam reveals normal bowl sounds, no masses, no organomegaly and no aortic enlargement. No inguinal adenopathy. Extremities are nonedematous and both femoral and pedal pulses are normal. The right ankle was tender to palpation in and the anatomy was somewhat distorted secondary to the surgery. She had a contusion on the right forearm and a contusion below the knee medially in the left lower leg. Skin without pallor or jaundice.  Warm and dry, without rash. Contusions as noted above. Neurologic exam reveals normal deep tendon reflexes and normal sensation. A mini metal status exam will be done today because of her complaint of forgetting easily  [facility] reading (PRIMARY) by  Dr. Kashmere Staffa:CXR:  Within normal limits for age                                      Assessment & Plan:  1. Hypertension  2. Hyperlipemia  3. Atrial fibrillation  4. GERD (gastroesophageal reflux disease)  5. Fibromyalgia  6. Memory deficit  7. Pain, joint, ankle and foot, right -Discussed the pros and cons of repeat surgery on ankle with orthopedic  8. Cough -Chest x-ray   Patient Instructions  Continue medications as directed Remember to get flu shot in October Continue aggressive therapeutic lifestyle changes which include diet and exercise Drink lots of fluids Return  FOBT Will call pt regarding osteoporosis treatment If reflux recurs, and a Zantac 75 mg before dinner and take for a couple weeks then wean off again We will arrange for a mammogram to be done   Nyra Capes MD

## 2013-08-09 NOTE — Patient Instructions (Addendum)
Continue medications as directed Remember to get flu shot in October Continue aggressive therapeutic lifestyle changes which include diet and exercise Drink lots of fluids Return  FOBT Will call pt regarding osteoporosis treatment If reflux recurs, and a Zantac 75 mg before dinner and take for a couple weeks then wean off again We will arrange for a mammogram to be done

## 2013-08-10 ENCOUNTER — Other Ambulatory Visit: Payer: Medicare Other

## 2013-08-10 ENCOUNTER — Other Ambulatory Visit: Payer: Self-pay | Admitting: Family Medicine

## 2013-08-10 MED ORDER — ALPRAZOLAM 0.5 MG PO TABS: 1.0000 mg | ORAL_TABLET | Freq: Every evening | ORAL | Status: DC | PRN

## 2013-08-10 NOTE — Telephone Encounter (Signed)
Last seen 08/09/13  DWM   If approved route to nurse to call in

## 2013-08-11 ENCOUNTER — Other Ambulatory Visit (INDEPENDENT_AMBULATORY_CARE_PROVIDER_SITE_OTHER): Payer: Medicare Other

## 2013-08-11 DIAGNOSIS — E785 Hyperlipidemia, unspecified: Secondary | ICD-10-CM

## 2013-08-11 DIAGNOSIS — M2550 Pain in unspecified joint: Secondary | ICD-10-CM

## 2013-08-11 LAB — POCT CBC
Granulocyte percent: 53.4 % (ref 37–80)
HCT, POC: 39.9 % (ref 37.7–47.9)
Hemoglobin: 13.6 g/dL (ref 12.2–16.2)
Lymph, poc: 2.3 (ref 0.6–3.4)
MCH, POC: 32.3 pg — AB (ref 27–31.2)
MCHC: 34.2 g/dL (ref 31.8–35.4)
MCV: 94.4 fL (ref 80–97)
MPV: 7.8 fL (ref 0–99.8)
POC Granulocyte: 2.9 (ref 2–6.9)
POC LYMPH PERCENT: 43.3 % (ref 10–50)
Platelet Count, POC: 270 K/uL (ref 142–424)
RBC: 4.2 M/uL (ref 4.04–5.48)
RDW, POC: 12.2 %
WBC: 5.4 K/uL (ref 4.6–10.2)

## 2013-08-11 NOTE — Telephone Encounter (Signed)
Rx for Xanax called into [facility]

## 2013-08-12 LAB — NMR LIPOPROFILE WITH LIPIDS
Cholesterol, Total: 124 mg/dL (ref <200)
HDL Particle Number: 34.2 umol/L (ref >=30.5)
HDL Size: 9.2 nm (ref >=9.2)
HDL-C: 54 mg/dL (ref >=40)
LDL (calc): 44 mg/dL (ref <100)
LDL Particle Number: 858 nmol/L (ref <1000)
LDL Size: 20.4 nm — ABNORMAL LOW (ref >20.5)
LP-IR Score: 34 (ref <=45)
Large HDL-P: 8.6 umol/L (ref >=4.8)
Large VLDL-P: 2.2 nmol/L (ref <=2.7)
Small LDL Particle Number: 583 nmol/L — ABNORMAL HIGH (ref <=527)
Triglycerides: 132 mg/dL (ref <150)
VLDL Size: 41.9 nm (ref <=46.6)

## 2013-08-12 LAB — HEPATIC FUNCTION PANEL
ALT: 25 U/L (ref 0–35)
AST: 27 U/L (ref 0–37)
Albumin: 4.3 g/dL (ref 3.5–5.2)
Alkaline Phosphatase: 69 U/L (ref 39–117)
Bilirubin, Direct: 0.1 mg/dL (ref 0.0–0.3)
Indirect Bilirubin: 0.5 mg/dL (ref 0.0–0.9)
Total Bilirubin: 0.6 mg/dL (ref 0.3–1.2)
Total Protein: 7.3 g/dL (ref 6.0–8.3)

## 2013-08-12 LAB — BASIC METABOLIC PANEL WITH GFR
BUN: 11 mg/dL (ref 6–23)
CO2: 28 mEq/L (ref 19–32)
Calcium: 9.5 mg/dL (ref 8.4–10.5)
Chloride: 98 mEq/L (ref 96–112)
Creat: 0.6 mg/dL (ref 0.50–1.10)
GFR, Est African American: >89 mL/min
GFR, Est Non African American: 89 mL/min
Glucose, Bld: 82 mg/dL (ref 70–99)
Potassium: 4.3 mEq/L (ref 3.5–5.3)
Sodium: 135 mEq/L (ref 135–145)

## 2013-08-12 LAB — VITAMIN D 25 HYDROXY (VIT D DEFICIENCY, FRACTURES): Vit D, 25-Hydroxy: 54 ng/mL (ref 30–89)

## 2013-08-18 ENCOUNTER — Encounter: Payer: Self-pay | Admitting: *Deleted

## 2013-08-18 ENCOUNTER — Other Ambulatory Visit (INDEPENDENT_AMBULATORY_CARE_PROVIDER_SITE_OTHER): Payer: Medicare Other

## 2013-08-18 DIAGNOSIS — Z1212 Encounter for screening for malignant neoplasm of rectum: Secondary | ICD-10-CM

## 2013-08-18 NOTE — Progress Notes (Signed)
Patient came in for labs only.

## 2013-08-18 NOTE — Progress Notes (Signed)
[name], pt states that she has finished the qd forteo shots and she questions when to start new injection/infusion? If you are aware can you contact pt

## 2013-08-19 LAB — FECAL OCCULT BLOOD, IMMUNOCHEMICAL: Fecal Occult Bld: POSITIVE — AB

## 2013-08-20 ENCOUNTER — Telehealth: Payer: Self-pay | Admitting: Family Medicine

## 2013-08-23 NOTE — Telephone Encounter (Signed)
Pt called

## 2013-08-23 NOTE — Progress Notes (Addendum)
Patient ID: [name], female   DOB: March 10, 1936, 77 y.o.   MRN: [id number]  Will check into insurance coverage of Prolia.

## 2013-08-24 ENCOUNTER — Ambulatory Visit: Payer: Medicare Other | Admitting: Adult Health

## 2013-08-24 ENCOUNTER — Telehealth: Payer: Self-pay | Admitting: Adult Health

## 2013-08-24 DIAGNOSIS — R490 Dysphonia: Secondary | ICD-10-CM

## 2013-08-24 NOTE — Telephone Encounter (Signed)
I spoke with pt and is aware. Referral has been placed

## 2013-08-24 NOTE — Telephone Encounter (Signed)
I called and spoke with [name]. [name] had right ankle/foot surgery last year in September and was in [name] for 3 months. She stated it has not healed completely. She reports Dr. Victorino Dike advised her then only option is to go back in and operate again. [name] reports she has been in a lot of pain and reason she cancelled appt for today w/ TP. She reports she is still having hoarseness off and on x 3-4 months now. Later in the afternoon the hoarseness is getting worse. She is requesting a referral to ENT to have this checked out. Please advise Dr. Vassie Loll thanks

## 2013-08-24 NOTE — Telephone Encounter (Signed)
OK to refer to dr Pollyann Kennedy

## 2013-08-25 NOTE — Progress Notes (Signed)
Patient ID: [name], female   DOB: 1936-06-05, 77 y.o.   MRN: [id number]  Checking on insurance coverage of Prolia.  pending

## 2013-08-26 ENCOUNTER — Other Ambulatory Visit (INDEPENDENT_AMBULATORY_CARE_PROVIDER_SITE_OTHER): Payer: Medicare Other

## 2013-08-26 ENCOUNTER — Telehealth: Payer: Self-pay | Admitting: Family Medicine

## 2013-08-26 DIAGNOSIS — Z1212 Encounter for screening for malignant neoplasm of rectum: Secondary | ICD-10-CM

## 2013-08-26 MED ORDER — AZITHROMYCIN 250 MG PO TABS: ORAL_TABLET | ORAL | Status: DC

## 2013-08-26 NOTE — Telephone Encounter (Signed)
[name] please address 

## 2013-08-26 NOTE — Telephone Encounter (Signed)
He can call in a Z-Pak if there is no drug interactions with the multiplicity of medicines that she is taking

## 2013-08-26 NOTE — Progress Notes (Signed)
Patient dropped off fobt 

## 2013-08-28 LAB — FECAL OCCULT BLOOD, IMMUNOCHEMICAL: Fecal Occult Bld: NEGATIVE

## 2013-09-01 ENCOUNTER — Telehealth: Payer: Self-pay | Admitting: Family Medicine

## 2013-09-01 NOTE — Telephone Encounter (Signed)
Called in.

## 2013-09-01 NOTE — Telephone Encounter (Signed)
[place] to address 

## 2013-09-01 NOTE — Telephone Encounter (Signed)
Prednisone 10 mg #20 taper

## 2013-09-02 ENCOUNTER — Telehealth: Payer: Self-pay | Admitting: Family Medicine

## 2013-09-03 ENCOUNTER — Other Ambulatory Visit: Payer: Self-pay | Admitting: Family Medicine

## 2013-09-03 ENCOUNTER — Telehealth: Payer: Self-pay | Admitting: Pulmonary Disease

## 2013-09-03 MED ORDER — METHYLPREDNISOLONE (PAK) 4 MG PO TABS: ORAL_TABLET | ORAL | Status: DC

## 2013-09-03 MED ORDER — AZITHROMYCIN 250 MG PO TABS: ORAL_TABLET | ORAL | Status: DC

## 2013-09-03 NOTE — Telephone Encounter (Signed)
Prednisone 10 mg #20 taper, 14 times a day for 2 days one 3 times a day for 2 days one twice daily for 2 days and 1 daily for 2 days

## 2013-09-03 NOTE — Telephone Encounter (Signed)
Spoke with patient, patient has already spoken with her PCP and they have called in script for her. Medication has also already been delivered. Nothing further needed at this time

## 2013-09-03 NOTE — Telephone Encounter (Signed)
zpak and medrol dose pack sent in and follow up not better

## 2013-09-03 NOTE — Telephone Encounter (Signed)
Please review message

## 2013-09-06 ENCOUNTER — Other Ambulatory Visit: Payer: Self-pay

## 2013-09-06 ENCOUNTER — Telehealth: Payer: Self-pay | Admitting: Pharmacist

## 2013-09-06 DIAGNOSIS — J45909 Unspecified asthma, uncomplicated: Secondary | ICD-10-CM

## 2013-09-06 MED ORDER — ALBUTEROL SULFATE HFA 108 (90 BASE) MCG/ACT IN AERS: 2.0000 | INHALATION_SPRAY | Freq: Four times a day (QID) | RESPIRATORY_TRACT | Status: DC | PRN

## 2013-09-06 NOTE — Telephone Encounter (Signed)
Pt aware.

## 2013-09-06 NOTE — Telephone Encounter (Signed)
Cost of Prolia for patient would be 20% (about $170.00). Tried to call patient but no ans.  Left message for her to call office.

## 2013-09-08 ENCOUNTER — Ambulatory Visit (INDEPENDENT_AMBULATORY_CARE_PROVIDER_SITE_OTHER): Payer: Medicare Other | Admitting: Cardiology

## 2013-09-08 ENCOUNTER — Encounter: Payer: Self-pay | Admitting: Cardiology

## 2013-09-08 VITALS — BP 178/90 | HR 72 | Ht 62.0 in | Wt 151.0 lb

## 2013-09-08 DIAGNOSIS — I4891 Unspecified atrial fibrillation: Secondary | ICD-10-CM

## 2013-09-08 NOTE — Telephone Encounter (Signed)
Discussed Prolia and other options for osteoporosis.

## 2013-09-08 NOTE — Progress Notes (Signed)
HPI The patient presents for followup of known coronary disease and atrial fib.  Since I last saw her she's not had a new cardiovascular complaints. She rarely has some isolated palpitations. Unfortunately she is having some problems with possible bronchitis. She actually is going to see an ENT physician soon.  She also might have to have redo ankle surgery.  The patient denies any new symptoms such as chest discomfort, neck or arm discomfort. There has been no new shortness of breath, PND or orthopnea. There has been no  presyncope or syncope.   Allergies  Allergen Reactions  . Aspirin Other (See Comments)    REACTION: regular strength causes "heart to beat fast"  . Captopril Hypertension  . Naproxen Other (See Comments)    Tongue swelling  . Penicillins     Swelling around site  . Sulfonamide Derivatives Hives  . Clindamycin/Lincomycin     Current Outpatient Prescriptions  Medication Sig Dispense Refill  . acetaminophen (TYLENOL) 325 MG tablet Take 650 mg by mouth every 6 (six) hours as needed. For pain/fever      . albuterol (PROVENTIL HFA;VENTOLIN HFA) 108 (90 BASE) MCG/ACT inhaler Inhale 2 puffs into the lungs every 6 (six) hours as needed for wheezing.  1 Inhaler  4  . albuterol (PROVENTIL) (2.5 MG/3ML) 0.083% nebulizer solution Take 2.5 mg by nebulization 4 (four) times daily as needed.      . ALPRAZolam (XANAX) 0.5 MG tablet Take 2 tablets (1 mg total) by mouth at bedtime as needed. For sleep  60 tablet  0  . Ascorbic Acid (VITAMIN C PO) Take 1 tablet by mouth every morning.      Marland Kitchen azithromycin (ZITHROMAX) 250 MG tablet Take 2 po first day and then one po qd x 4days  6 tablet  0  . Calcium Carbonate-Vitamin D (CALCIUM 600+D) 600-400 MG-UNIT per tablet Take 1 tablet by mouth daily at 6 PM.       . cholecalciferol (VITAMIN D) 1000 UNITS tablet Take 1,000 Units by mouth 2 (two) times daily with breakfast and lunch.       . CRESTOR 20 MG tablet TAKE ONE TABLET BY MOUTH ONE TIME  DAILY  30 tablet  2  . cyclobenzaprine (FLEXERIL) 10 MG tablet TAKE 1/2 TO 1 TABLET 3 TIMES DAILY AS NEEDED  90 tablet  PRN  . dextromethorphan-guaiFENesin (MUCINEX DM) 30-600 MG per 12 hr tablet Take 1 tablet by mouth every 12 (twelve) hours. scheduled      . dicyclomine (BENTYL) 10 MG capsule Take 10-20 mg by mouth every 6 (six) hours as needed. For spasms      . fluticasone (FLONASE) 50 MCG/ACT nasal spray Place 2 sprays into the nose daily.  16 g  5  . isosorbide mononitrate (IMDUR) 60 MG 24 hr tablet TAKE ONE TABLET BY MOUTH ONE TIME DAILY  30 tablet  2  . levothyroxine (SYNTHROID, LEVOTHROID) 25 MCG tablet TAKE 2 TABLETS BY MOUTH DAILY AS DIRECTED  60 tablet  9  . loratadine (CLARITIN) 10 MG tablet Take 10 mg by mouth daily.      Marland Kitchen LOVAZA 1 G capsule TAKE 2 CAPSULES BY MOUTH TWICE DAILY  120 capsule  4  . methylPREDNIsolone (MEDROL DOSPACK) 4 MG tablet follow package directions  21 tablet  0  . metoprolol (LOPRESSOR) 50 MG tablet TAKE ONE TABLET BY MOUTH TWICE DAILY  60 tablet  1  . montelukast (SINGULAIR) 10 MG tablet TAKE ONE TABLET BY MOUTH ONE  TIME DAILY  30 tablet  5  . Multiple Vitamin (MULTIVITAMIN WITH MINERALS) TABS Take 1 tablet by mouth daily.      . nitroGLYCERIN (NITROSTAT) 0.4 MG SL tablet Place 0.4 mg under the tongue every 5 (five) minutes as needed. For chest pain      . omeprazole-sodium bicarbonate (ZEGERID) 40-1100 MG per capsule TAKE 1 CAPSULE 30 MINUTES BEFORE BREAKFAST EACH DAY  30 capsule  2  . ondansetron (ZOFRAN) 4 MG tablet TAKE ONE TABLET BY MOUTH EVERY TWELVE HOURS AS NEEDED FOR NAUSEA  20 tablet  0  . predniSONE (DELTASONE) 10 MG tablet       . sertraline (ZOLOFT) 100 MG tablet TAKE ONE TABLET BY MOUTH ONE  TIME DAILY  30 tablet  2  . VITAMIN E PO Take 1 capsule by mouth every morning.      Carlena Hurl 20 MG TABS tablet TAKE ONE TABLET BY MOUTH ONE TIME DAILY  30 tablet  1   Current Facility-Administered Medications  Medication Dose Route Frequency Provider  Last Rate Last Dose  . albuterol (PROVENTIL) (5 MG/ML) 0.5% nebulizer solution 2.5 mg  2.5 mg Nebulization Once Ernestina Penna, MD      . budesonide (PULMICORT) nebulizer solution 0.25 mg  0.25 mg Nebulization Once Ernestina Penna, MD      . budesonide (PULMICORT) nebulizer solution 0.25 mg  0.25 mg Nebulization Once Ernestina Penna, MD        Past Medical History  Diagnosis Date  . GERD (gastroesophageal reflux disease)   . Anxiety disorder   . Arthritis   . CAD (coronary artery disease)     Cath May 2010.  Nonbstructive  . Depression   . Hypothyroid   . Asthmatic bronchitis   . Hypertension     dr Antoine Poche  . OA (osteoarthritis)   . Chronic bronchitis   . 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 Encephalitis     d/t meningitis  . PONV (postoperative nausea and vomiting)     history of cardiac arrest day 1 post surgery  in 2008    Past Surgical History  Procedure Laterality Date  . Total knee arthroplasty      right knee  . Back surgery    . Coronary angioplasty with stent placement  2003  . Sinus surgery with instatrak    . Knee arthroscopy  03/26/2012    Procedure: ARTHROSCOPY KNEE;  Surgeon: [name], MD;  Location: [facility] OR;  Service: Orthopedics;  Laterality: Left;  with Debridement of Lateral Meniscus tear  . Rotator cuff repair      bilateral  . Ankle fusion  08/27/2012    Procedure: ARTHRODESIS ANKLE;  Surgeon: [name], MD;  Location: [facility] OR;  Service: Orthopedics;  Laterality: Right;  Arthrodesis right ankle and subtalar joint    ROS:  As stated in the HPI and negative for all other systems.  PHYSICAL EXAM BP 178/90  Pulse 72  Ht 5\' 2"  (1.575 m)  Wt 151 lb (68.493 kg)  BMI 27.61 kg/m2 GENERAL:  Well appearing NECK:  No jugular venous distention, waveform within normal limits, carotid upstroke brisk and symmetric, no bruits, no thyromegaly LYMPHATICS:  No cervical, inguinal adenopathy LUNGS: decreased breath sounds with expiratory  wheezes. BACK:  No CVA tenderness HEART:  PMI not displaced or sustained,S1 and S2 within normal limits, no S3, no S4, no clicks, no rubs, no murmurs ABD:  Flat, positive bowel  sounds normal in frequency in pitch, no bruits, no rebound, no guarding, no midline pulsatile mass, no hepatomegaly, no splenomegaly EXT:  2 plus pulses throughout, no edema, no cyanosis no clubbing, right ankle in a soft brace.   EKG:  Sinus rhythm, rate 69, axis within normal limits, intervals within normal limits, no acute ST-T wave changes 09/08/2013  ASSESSMENT AND PLAN  CAD:  The patient has had no new symptoms since a negative stress perfusion in 2013 She will continue with risk reduction. She will stop her ASA  ATRIAL FIBRILLATION:  She is doing well with and is happy to be on Xarelto.  No change in therapy is indicated.   HTN:  Her Blood pressure is elevated but this might be due to that she's currently taking prednisone.  She will keep and I on this. No change in therapy is planned.

## 2013-09-08 NOTE — Patient Instructions (Addendum)
The current medical regimen is effective;  continue present plan and medications.  Follow up in 1 year with Dr Hochrein.  You will receive a letter in the mail 2 months before you are due.  Please call us when you receive this letter to schedule your follow up appointment.  

## 2013-09-13 ENCOUNTER — Other Ambulatory Visit: Payer: Self-pay

## 2013-09-13 MED ORDER — ALPRAZOLAM 0.5 MG PO TABS: 1.0000 mg | ORAL_TABLET | Freq: Every evening | ORAL | Status: DC | PRN

## 2013-09-13 MED ORDER — METOPROLOL TARTRATE 50 MG PO TABS: 50.0000 mg | ORAL_TABLET | Freq: Two times a day (BID) | ORAL | Status: DC

## 2013-09-13 NOTE — Telephone Encounter (Signed)
Med called to [facility]

## 2013-09-13 NOTE — Telephone Encounter (Signed)
Last seen 08/09/13  DWM  If approved route to nurse to phone in Xanax to [facility]

## 2013-09-13 NOTE — Telephone Encounter (Signed)
The Xanax is okay for  six-month

## 2013-09-16 ENCOUNTER — Ambulatory Visit: Payer: Medicare Other

## 2013-10-02 ENCOUNTER — Other Ambulatory Visit: Payer: Self-pay | Admitting: Family Medicine

## 2013-10-05 ENCOUNTER — Ambulatory Visit (INDEPENDENT_AMBULATORY_CARE_PROVIDER_SITE_OTHER): Payer: Medicare Other

## 2013-10-05 DIAGNOSIS — Z23 Encounter for immunization: Secondary | ICD-10-CM

## 2013-10-06 ENCOUNTER — Telehealth: Payer: Self-pay | Admitting: Family Medicine

## 2013-10-06 NOTE — Telephone Encounter (Signed)
Patient meeting with insurance agent tomorrow and wanted name of osteoporosis medication that we are considering.  Prolia or Reclast.

## 2013-10-11 ENCOUNTER — Other Ambulatory Visit: Payer: Self-pay | Admitting: Cardiology

## 2013-10-12 ENCOUNTER — Other Ambulatory Visit: Payer: Self-pay | Admitting: Family Medicine

## 2013-10-14 NOTE — Telephone Encounter (Signed)
Reclast is not covered by insurance.  Patient has decided to get Prolia.  Will order and give at appt 11/10/2013 with Dr Christell Constant.

## 2013-10-18 NOTE — Telephone Encounter (Signed)
Patient to start Prolia. Will order.

## 2013-10-29 ENCOUNTER — Other Ambulatory Visit: Payer: Self-pay | Admitting: Family Medicine

## 2013-11-01 ENCOUNTER — Other Ambulatory Visit: Payer: Self-pay | Admitting: Family Medicine

## 2013-11-08 ENCOUNTER — Other Ambulatory Visit: Payer: Self-pay | Admitting: Family Medicine

## 2013-11-08 ENCOUNTER — Telehealth: Payer: Self-pay | Admitting: Pulmonary Disease

## 2013-11-08 MED ORDER — PREDNISONE 10 MG PO TABS: ORAL_TABLET | ORAL | Status: DC

## 2013-11-08 MED ORDER — LEVOFLOXACIN 500 MG PO TABS: 500.0000 mg | ORAL_TABLET | Freq: Every day | ORAL | Status: DC

## 2013-11-08 NOTE — Telephone Encounter (Signed)
Pt aware of recs and rx sent. Nothing further needed 

## 2013-11-08 NOTE — Telephone Encounter (Signed)
levaquin 500 daily x 7ds mucinex 600 bid Prednisone to take only if no better in  24h - 10 mg tabs  Take 2 tabs daily with food x 5ds, then 1 tab daily with food x 5ds then STOP Arrange FU Ov with TP

## 2013-11-08 NOTE — Telephone Encounter (Signed)
LAst OV 07-20-13. Pt is c/o having chest congestion, productive cough with green phlegm, wheezing, chest tightness, sinus congestion and PND all x 1 week. Pt states she has been suing netti pot, and taking all meds as prescribed without relief. She states she also went to  Dr. Pollyann Kennedy as recommended and he advised her all was normal. Pt states she cannot come in for ov today, see lives in [place] and has transportation issues. Please advise. Carron Curie, CMA Allergies  Allergen Reactions  . Aspirin Other (See Comments)    REACTION: regular strength causes "heart to beat fast"  . Captopril Hypertension  . Naproxen Other (See Comments)    Tongue swelling  . Penicillins     Swelling around site  . Sulfonamide Derivatives Hives  . Clindamycin/Lincomycin

## 2013-11-09 ENCOUNTER — Other Ambulatory Visit: Payer: Self-pay

## 2013-11-09 MED ORDER — DICYCLOMINE HCL 10 MG PO CAPS: 10.0000 mg | ORAL_CAPSULE | Freq: Four times a day (QID) | ORAL | Status: DC | PRN

## 2013-11-10 ENCOUNTER — Ambulatory Visit: Payer: Medicare Other | Admitting: Family Medicine

## 2013-11-10 ENCOUNTER — Telehealth: Payer: Self-pay | Admitting: Pharmacist

## 2013-11-10 ENCOUNTER — Telehealth: Payer: Self-pay | Admitting: Family Medicine

## 2013-11-10 NOTE — Telephone Encounter (Signed)
Patient has bronchitis and cannot come to todays visit with Dr Christell Constant.  Called to reschedule Prolia injection.  She will call when she feels better and make appt. If not will get first Prolia injection in January when she sees Dr Christell Constant.

## 2013-11-15 ENCOUNTER — Encounter: Payer: Self-pay | Admitting: Adult Health

## 2013-11-15 ENCOUNTER — Ambulatory Visit (INDEPENDENT_AMBULATORY_CARE_PROVIDER_SITE_OTHER): Payer: Medicare Other | Admitting: Adult Health

## 2013-11-15 VITALS — BP 136/82 | HR 72 | Temp 98.4°F | Ht 62.0 in | Wt 152.0 lb

## 2013-11-15 DIAGNOSIS — J45909 Unspecified asthma, uncomplicated: Secondary | ICD-10-CM

## 2013-11-15 MED ORDER — LEVOFLOXACIN 500 MG PO TABS: 500.0000 mg | ORAL_TABLET | Freq: Every day | ORAL | Status: AC

## 2013-11-15 MED ORDER — HYDROCODONE-HOMATROPINE 5-1.5 MG/5ML PO SYRP: 2.5000 mL | ORAL_SOLUTION | Freq: Three times a day (TID) | ORAL | Status: DC | PRN

## 2013-11-15 NOTE — Patient Instructions (Addendum)
May use Albuterol Neb every 6hr as needed for wheezing /shortness of breath.  Zyrtec 10mg  At bedtime  As needed  Drainage  Mucinex DM Twice daily  As needed  Cough/congestion  Extend Levaquin 500mg  daily for 3 days .  Finish Prednisone as directed.  Add Pepcid 20mg  At bedtime .  May use Hydromet 1/2 -1 tsp every 8hr As needed  Cough, may make you sleepy.  Please contact office for sooner follow up if symptoms do not improve or worsen or seek emergency care  Follow up Dr. Vassie Loll  6 weeks at [facility] .

## 2013-11-15 NOTE — Progress Notes (Signed)
  Subjective:    Patient ID: [name], female    DOB: Nov 15, 1936, 77 y.o.   MRN: [id number]  HPI   77/F remote ex smoker with reactive airway disease - ? GERD vs sinusitis, nml pFTs  She smoked a PPD x 20-25 yrs before quitting in 1990. She develops recurrent chest colds requiring several rounds of Abx to get better.  Data:  SHe underwent esophageal dilation for hypertensive LES (Dr Juanda Chance) & is on protonix two times a day . Evaluation for chest pain in 6/10 (Dr Antoine Poche) has shown non obstructive CAD - attributed to esophageal spasm.Sucralfate added by GI to zegerid .  Took macrodantin x 2 yrs for UTIs , stopped '10  Prednisone makes her hyper and nervous. Has osteoporosis with fractures.  PFTs nml '11 .  RAST - IgE 225 -high but no sensitivity to common indoor/ outdoor allergens  Underwent endoscopic sinus surgery 1/12 by dr Pollyann Kennedy  Treated x 6 wks with clinda for chronic sinusitis, started wheezing again when ABx stopped   06/24/2013  2 y FU -  Required abx in may 2014  Reconstructive surgery of RLE  Pt c/o bronchitis x 2 weeks. C/o cough w/ yellow-green tint, wheezing, chest tx, increase SOB.  Taken mucinex, budesonide & albuterol nebs without relief  [employer] put her on a preventive program, told her to use a netti pot & albuterol nebs once daily - she questions the need, also wonders if budesonide needed. I note that symbicort was stopped 2012  CXR 4/13 nml  Spirometry -no obstruction   >>omnicef, pulmicort MDI  07/20/13  Pt c/o cough w/ yellow phlem, wheezing, and chest tx, hoarseness, PND, nasal congestion slightly (using netti pot). Pt states this as been going on x last OV in July. Pt will finish prednisone today Off budesonide x 1 week, stopped pulmicort herself due to side effects Takes loratidine daily GERD controlled on meds Hoarseness & cough -main issues >>doxycycline x 10 d   11/15/2013 Acute OV  Complains of wheezing, prod cough with yellow mucous.  Pt feels  better since beginning abx  and prednisone last week. Pt developed cough and congestion last week was called in Levaquin and prednisone taper. Patient is feeling better, however, continues to have productive cough, with thick, yellow mucus, and intermittent wheezing. She has few days left on prednisone. However, today is her last day of Levaquin. She denies any hemoptysis, orthopnea, PND, or leg swelling. No fever Does have intermittent reflux esp at night.  Complains that cough is keeping her up at night.  Review of Systems  neg for any significant sore throat, dysphagia, itching, sneezing, chills, sweats, unintended wt loss, pleuritic or exertional cp, hempoptysis, orthopnea pnd or change in chronic leg swelling. Also denies presyncope, palpitations, heartburn, abdominal pain, nausea, vomiting, diarrhea or change in bowel or urinary habits, dysuria,hematuria, rash, arthralgias, visual complaints, headache, numbness weakness or ataxia.     Objective:   Physical Exam   Gen. Pleasant, elderly,  in no distress ENT - no lesions, no post nasal drip, upper airway pseudowheeze Neck: No JVD, no thyromegaly, no carotid bruits Lungs: no use of accessory muscles, no dullness to percussion, trace rhonchi  Cardiovascular: Rhythm regular, heart sounds  normal, no murmurs or gallops, no peripheral edema Musculoskeletal: No deformities, no cyanosis or clubbing         Assessment & Plan:

## 2013-11-16 ENCOUNTER — Telehealth: Payer: Self-pay | Admitting: Family Medicine

## 2013-11-16 NOTE — Assessment & Plan Note (Addendum)
Slow to resolve flare with bronchitis   Plan  May use Albuterol Neb every 6hr as needed for wheezing /shortness of breath.  Zyrtec 10mg  At bedtime  As needed  Drainage  Mucinex DM Twice daily  As needed  Cough/congestion  Extend Levaquin 500mg  daily for 3 days .  Finish Prednisone as directed.  Add Pepcid 20mg  At bedtime .  May use Hydromet 1/2 -1 tsp every 8hr As needed  Cough, may make you sleepy.  Please contact office for sooner follow up if symptoms do not improve or worsen or seek emergency care  Follow up Dr. Vassie Loll  6 weeks at [facility] .

## 2013-11-30 ENCOUNTER — Other Ambulatory Visit: Payer: Self-pay | Admitting: Family Medicine

## 2013-12-01 ENCOUNTER — Telehealth: Payer: Self-pay | Admitting: *Deleted

## 2013-12-01 MED ORDER — CIPROFLOXACIN HCL 500 MG PO TABS: 500.0000 mg | ORAL_TABLET | Freq: Two times a day (BID) | ORAL | Status: DC

## 2013-12-01 NOTE — Telephone Encounter (Signed)
Pt aware and med sent in  

## 2013-12-01 NOTE — Telephone Encounter (Signed)
Cipro 500 twice daily for 7 days

## 2013-12-01 NOTE — Telephone Encounter (Signed)
Wants Dr. Christell Constant to call in something for a bladder infection, says she does not have transportation to come in to be seen and has had so many infections she knows that is her problem.  She is allergic to penicillin and sulfa and wants med called in to [facility].

## 2013-12-10 ENCOUNTER — Other Ambulatory Visit: Payer: Self-pay | Admitting: Family Medicine

## 2013-12-13 ENCOUNTER — Telehealth: Payer: Self-pay | Admitting: Internal Medicine

## 2013-12-13 NOTE — Telephone Encounter (Signed)
Last seen 08/09/13  DWM

## 2013-12-13 NOTE — Telephone Encounter (Signed)
Patient was advised from Dr. Pollyann Kennedy to see her GI for possible reflux causing her pulmonary problems.  She will come in and see Dr. Marina Goodell 12/30/13 10:00

## 2013-12-20 ENCOUNTER — Telehealth: Payer: Self-pay | Admitting: Pulmonary Disease

## 2013-12-20 MED ORDER — PREDNISONE 10 MG PO TABS: ORAL_TABLET | ORAL | Status: DC

## 2013-12-20 NOTE — Telephone Encounter (Signed)
Pt is aware RX was called in. Nothing further needed

## 2013-12-20 NOTE — Telephone Encounter (Signed)
[facility] PT line busy [facility] 

## 2013-12-20 NOTE — Telephone Encounter (Signed)
[facility] pt line still busy [facility]

## 2013-12-20 NOTE — Telephone Encounter (Signed)
Called and spoke with pt and she stated that this has been going on for several months.  Pt was seen by Dr. Constance Holster from ENT and was told that everything checked out ok and was told that this may be related to her reflux problem.  She stated that she has appt with Dr. Henrene Pastor with GI on 12/29/13.  Pt is requesting that RA send in a round of prednisone for her.  She is having cough with white sputum and wheezing.  She feels that the prednisone will help with this.  RA please advise. Thanks  Allergies  Allergen Reactions  . Aspirin Other (See Comments)    REACTION: regular strength causes "heart to beat fast"  . Captopril Hypertension  . Naproxen Other (See Comments)    Tongue swelling  . Penicillins     Swelling around site  . Sulfonamide Derivatives Hives  . Clindamycin/Lincomycin      Current Outpatient Prescriptions on File Prior to Visit  Medication Sig Dispense Refill  . acetaminophen (TYLENOL) 325 MG tablet Take 650 mg by mouth every 6 (six) hours as needed. For pain/fever      . albuterol (PROVENTIL HFA;VENTOLIN HFA) 108 (90 BASE) MCG/ACT inhaler Inhale 2 puffs into the lungs every 6 (six) hours as needed for wheezing.  1 Inhaler  4  . albuterol (PROVENTIL) (2.5 MG/3ML) 0.083% nebulizer solution Take 2.5 mg by nebulization 4 (four) times daily as needed.      . ALPRAZolam (XANAX) 0.5 MG tablet Take 2 tablets (1 mg total) by mouth at bedtime as needed. For sleep  60 tablet  0  . Ascorbic Acid (VITAMIN C PO) Take 1 tablet by mouth every morning.      . Calcium Carbonate-Vitamin D (CALCIUM 600+D) 600-400 MG-UNIT per tablet Take 1 tablet by mouth daily at 6 PM.       . cholecalciferol (VITAMIN D) 1000 UNITS tablet Take 1,000 Units by mouth 2 (two) times daily with breakfast and lunch.       . ciprofloxacin (CIPRO) 500 MG tablet Take 1 tablet (500 mg total) by mouth 2 (two) times daily.  14 tablet  0  . CRESTOR 20 MG tablet TAKE 1 TABLET ONCE A DAY  30 tablet  2  . cyclobenzaprine (FLEXERIL)  10 MG tablet TAKE 1/2 TO 1 TABLET 3 TIMES DAILY AS NEEDED  90 tablet  PRN  . dextromethorphan-guaiFENesin (MUCINEX DM) 30-600 MG per 12 hr tablet Take 1 tablet by mouth every 12 (twelve) hours. scheduled      . dicyclomine (BENTYL) 10 MG capsule Take 1-2 capsules (10-20 mg total) by mouth every 6 (six) hours as needed. For spasms  60 capsule  0  . fluticasone (FLONASE) 50 MCG/ACT nasal spray 2 SPRAYS IN EACH NOSTRIL ONCE A DAY  16 g  2  . HYDROcodone-homatropine (HYDROMET) 5-1.5 MG/5ML syrup Take 2.5 mLs by mouth every 8 (eight) hours as needed for cough.  240 mL  0  . isosorbide mononitrate (IMDUR) 60 MG 24 hr tablet TAKE 1 TABLET ONCE A DAY  30 tablet  10  . levofloxacin (LEVAQUIN) 500 MG tablet Take 1 tablet (500 mg total) by mouth daily.  7 tablet  0  . levothyroxine (SYNTHROID, LEVOTHROID) 25 MCG tablet TAKE 2 TABLETS BY MOUTH DAILY AS DIRECTED  60 tablet  9  . loratadine (CLARITIN) 10 MG tablet TAKE 1 TABLET ONCE A DAY  30 tablet  3  . metoprolol (LOPRESSOR) 50 MG tablet Take 1 tablet (50  mg total) by mouth 2 (two) times daily.  60 tablet  4  . montelukast (SINGULAIR) 10 MG tablet TAKE 1 TABLET ONCE A DAY  30 tablet  3  . Multiple Vitamin (MULTIVITAMIN WITH MINERALS) TABS Take 1 tablet by mouth daily.      . nitroGLYCERIN (NITROSTAT) 0.4 MG SL tablet Place 0.4 mg under the tongue every 5 (five) minutes as needed. For chest pain      . omega-3 acid ethyl esters (LOVAZA) 1 G capsule TAKE (2) CAPSULES TWICE DAILY.  120 capsule  1  . omeprazole-sodium bicarbonate (ZEGERID) 40-1100 MG per capsule TAKE (1) CAPSULE DAILY  30 capsule  3  . ondansetron (ZOFRAN) 4 MG tablet TAKE ONE TABLET BY MOUTH EVERY TWELVE HOURS AS NEEDED FOR NAUSEA  20 tablet  0  . predniSONE (DELTASONE) 10 MG tablet       . sertraline (ZOLOFT) 100 MG tablet TAKE 1 TABLET ONCE A DAY  30 tablet  0  . VITAMIN E PO Take 1 capsule by mouth every morning.      Alveda Reasons 20 MG TABS tablet TAKE 1 TABLET ONCE A DAY  30 tablet  2    Current Facility-Administered Medications on File Prior to Visit  Medication Dose Route Frequency Provider Last Rate Last Dose  . albuterol (PROVENTIL) (5 MG/ML) 0.5% nebulizer solution 2.5 mg  2.5 mg Nebulization Once Chipper Herb, MD      . budesonide (PULMICORT) nebulizer solution 0.25 mg  0.25 mg Nebulization Once Chipper Herb, MD      . budesonide (PULMICORT) nebulizer solution 0.25 mg  0.25 mg Nebulization Once Chipper Herb, MD

## 2013-12-20 NOTE — Telephone Encounter (Signed)
Prednisone 10 mg tabs  Take 2 tabs daily with food x 5ds, then 1 tab daily with food x 5ds then STOP  

## 2013-12-29 ENCOUNTER — Ambulatory Visit: Payer: Medicare Other | Admitting: Internal Medicine

## 2013-12-30 ENCOUNTER — Encounter: Payer: Self-pay | Admitting: Pulmonary Disease

## 2013-12-30 ENCOUNTER — Ambulatory Visit (INDEPENDENT_AMBULATORY_CARE_PROVIDER_SITE_OTHER): Payer: Medicare HMO | Admitting: Pulmonary Disease

## 2013-12-30 ENCOUNTER — Ambulatory Visit (HOSPITAL_BASED_OUTPATIENT_CLINIC_OR_DEPARTMENT_OTHER)
Admission: RE | Admit: 2013-12-30 | Discharge: 2013-12-30 | Disposition: A | Discharge status: Home / Self Care | Payer: Medicare Other | Source: Ambulatory Visit | Attending: Pulmonary Disease | Admitting: Pulmonary Disease

## 2013-12-30 VITALS — BP 124/76 | HR 73 | Temp 98.0°F | Wt 154.0 lb

## 2013-12-30 DIAGNOSIS — J45909 Unspecified asthma, uncomplicated: Secondary | ICD-10-CM

## 2013-12-30 DIAGNOSIS — I2510 Atherosclerotic heart disease of native coronary artery without angina pectoris: Secondary | ICD-10-CM | POA: Insufficient documentation

## 2013-12-30 DIAGNOSIS — K224 Dyskinesia of esophagus: Secondary | ICD-10-CM

## 2013-12-30 DIAGNOSIS — R059 Cough, unspecified: Secondary | ICD-10-CM | POA: Insufficient documentation

## 2013-12-30 DIAGNOSIS — J42 Unspecified chronic bronchitis: Secondary | ICD-10-CM | POA: Insufficient documentation

## 2013-12-30 DIAGNOSIS — R05 Cough: Secondary | ICD-10-CM | POA: Insufficient documentation

## 2013-12-30 DIAGNOSIS — I10 Essential (primary) hypertension: Secondary | ICD-10-CM | POA: Insufficient documentation

## 2013-12-30 MED ORDER — BUDESONIDE 0.5 MG/2ML IN SUSP: 0.5000 mg | Freq: Two times a day (BID) | RESPIRATORY_TRACT | Status: DC

## 2013-12-30 MED ORDER — PREDNISONE 10 MG PO TABS: ORAL_TABLET | ORAL | Status: DC

## 2013-12-30 NOTE — Patient Instructions (Signed)
You have wheezing due to reflux & esophageal issues CXR today Swallow study Appt with GI - Dr Henrene Pastor Prednisone 10 mg -Take 4 tabs  daily with food x 4 days, then 3 tabs daily x 4 days, then 2 tabs daily x 4 days, then 1 tab daily x4 days then stop. #40 Pulmicort 0.5 mg nebs twice daily - RINSE mouth after OK to use albuterol nebs a third time if needed

## 2013-12-30 NOTE — Assessment & Plan Note (Addendum)
Bronchospasm due to reflux & esophageal issues CXR today Swallow study Appt with GI - Dr Henrene Pastor Prednisone 10 mg -Take 4 tabs  daily with food x 4 days, then 3 tabs daily x 4 days, then 2 tabs daily x 4 days, then 1 tab daily x4 days then stop. #40 Pulmicort 0.5 mg nebs twice daily - RINSE mouth after OK to use albuterol nebs a third time if needed  -If above strategy does not work, will have to start back on symbicort

## 2013-12-30 NOTE — Progress Notes (Signed)
Subjective:    Patient ID: [name], female    DOB: 21-Feb-1936, 78 y.o.   MRN: [id number]  HPI 77/F remote ex smoker with reactive airway disease - ? GERD vs sinusitis, nml pFTs  She smoked a PPD x 20-25 yrs before quitting in 1990. She develops recurrent chest colds requiring several rounds of Abx to get better.   Data:  SHe underwent esophageal dilation for hypertensive LES (Dr Olevia Perches) & is on protonix two times a day . Evaluation for chest pain in 6/10 (Dr Percival Spanish) -no obstructive CAD - attributed to esophageal spasm.Sucralfate added by GI to zegerid .  Took macrodantin x 2 yrs for UTIs , stopped '10  Prednisone makes her hyper and nervous. Has osteoporosis with fractures.  PFTs nml '11 .  RAST - IgE 225 -high but no sensitivity to common indoor/ outdoor allergens  Underwent endoscopic sinus surgery 1/12 by dr Constance Holster  Treated x 6 wks with clinda for chronic sinusitis, started wheezing again when ABx stopped   06/24/2013  2 y FU -  Required abx in may 2014  Reconstructive surgery of RLE  Pt c/o bronchitis x 2 weeks. C/o cough w/ yellow-green tint, wheezing, chest tx, increase SOB.   symbicort was stopped 2012  CXR 4/13 nml  Spirometry -no obstruction  >>omnicef, pulmicort MDI   07/20/13 stopped pulmicort herself due to side effects  >>doxycycline x 10 d   11/15/2013 Acute OV  last week was called in Levaquin and prednisone taper.   12/30/2013 going on for several months -maybe since 06/2013 with intermittent relief with abx & prednisone.  Pt was seen by Dr. Constance Holster from ENT and was told that everything checked out ok and was told that this may be related to her reflux problem. She  has appt with Dr. Henrene Pastor with GI on 12/29/13. Given another round of  prednisone . She is having cough with white sputum and wheezing.  Does have intermittent reflux esp at night.  Complains that cough is keeping her up at night.  Past Medical History  Diagnosis Date  . GERD (gastroesophageal reflux  disease)   . Anxiety disorder   . Arthritis   . CAD (coronary artery disease)     Cath May 2010.  Nonbstructive  . Depression   . Hypothyroid   . Asthmatic bronchitis   . Hypertension     dr Percival Spanish  . OA (osteoarthritis)   . Chronic bronchitis   . 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 Encephalitis     d/t meningitis  . PONV (postoperative nausea and vomiting)     history of cardiac arrest day 1 post surgery  in 2008    Review of Systems neg for any significant sore throat, dysphagia, itching, sneezing, nasal congestion or excess/ purulent secretions, fever, chills, sweats, unintended wt loss, pleuritic or exertional cp, hempoptysis, orthopnea pnd or change in chronic leg swelling. Also denies presyncope, palpitations, heartburn, abdominal pain, nausea, vomiting, diarrhea or change in bowel or urinary habits, dysuria,hematuria, rash, arthralgias, visual complaints, headache, numbness weakness or ataxia.     Objective:   Physical Exam  Gen. Pleasant, obese, in no distress, normal affect ENT - no lesions, no post nasal drip, class 2-3 airway Neck: No JVD, no thyromegaly, no carotid bruits Lungs: no use of accessory muscles, no dullness to percussion , faint BL exp  rhonchi  Cardiovascular: Rhythm regular, heart sounds  normal, no murmurs or gallops, no peripheral  edema Abdomen: soft and non-tender, no hepatosplenomegaly, BS normal. Musculoskeletal: No deformities, no cyanosis or clubbing Neuro:  alert, non focal, no tremors       Assessment & Plan:

## 2013-12-31 ENCOUNTER — Other Ambulatory Visit (HOSPITAL_COMMUNITY): Payer: Self-pay | Admitting: Pulmonary Disease

## 2013-12-31 ENCOUNTER — Other Ambulatory Visit: Payer: Self-pay | Admitting: Pulmonary Disease

## 2013-12-31 DIAGNOSIS — R1310 Dysphagia, unspecified: Secondary | ICD-10-CM

## 2013-12-31 MED ORDER — BUDESONIDE 0.5 MG/2ML IN SUSP: 0.5000 mg | Freq: Two times a day (BID) | RESPIRATORY_TRACT | Status: DC

## 2014-01-03 ENCOUNTER — Telehealth: Payer: Self-pay | Admitting: Pulmonary Disease

## 2014-01-03 MED ORDER — ALBUTEROL SULFATE (2.5 MG/3ML) 0.083% IN NEBU: 2.5000 mg | INHALATION_SOLUTION | Freq: Four times a day (QID) | RESPIRATORY_TRACT | Status: DC | PRN

## 2014-01-03 NOTE — Telephone Encounter (Signed)
Can you pl check with her pharmacy - budesonide is generic Otherwise  -ok for albuterol alone

## 2014-01-03 NOTE — Telephone Encounter (Signed)
Spoke with pt. States that she can not afford the Pulmicort nebulizer medication, it's going to cost her $75. Wants to know if we can just increase the amount of albuterol she can use.  RA - please advise. Thanks.

## 2014-01-03 NOTE — Telephone Encounter (Signed)
Pt wants to stick with just albuterol for now. Advised her that I would send in a refill on this. Nothing further was needed.

## 2014-01-04 ENCOUNTER — Encounter: Payer: Self-pay | Admitting: Internal Medicine

## 2014-01-06 ENCOUNTER — Ambulatory Visit (HOSPITAL_COMMUNITY)
Admission: RE | Admit: 2014-01-06 | Discharge: 2014-01-06 | Disposition: A | Discharge status: Home / Self Care | Payer: Medicare Other | Source: Ambulatory Visit | Attending: Pulmonary Disease | Admitting: Pulmonary Disease

## 2014-01-06 DIAGNOSIS — K224 Dyskinesia of esophagus: Secondary | ICD-10-CM | POA: Insufficient documentation

## 2014-01-06 DIAGNOSIS — Z87891 Personal history of nicotine dependence: Secondary | ICD-10-CM | POA: Insufficient documentation

## 2014-01-06 DIAGNOSIS — R6889 Other general symptoms and signs: Secondary | ICD-10-CM | POA: Insufficient documentation

## 2014-01-06 DIAGNOSIS — J45909 Unspecified asthma, uncomplicated: Secondary | ICD-10-CM

## 2014-01-06 DIAGNOSIS — R059 Cough, unspecified: Secondary | ICD-10-CM | POA: Insufficient documentation

## 2014-01-06 DIAGNOSIS — R142 Eructation: Secondary | ICD-10-CM | POA: Insufficient documentation

## 2014-01-06 DIAGNOSIS — R062 Wheezing: Secondary | ICD-10-CM | POA: Insufficient documentation

## 2014-01-06 DIAGNOSIS — R490 Dysphonia: Secondary | ICD-10-CM | POA: Insufficient documentation

## 2014-01-06 DIAGNOSIS — R141 Gas pain: Secondary | ICD-10-CM | POA: Insufficient documentation

## 2014-01-06 DIAGNOSIS — R143 Flatulence: Secondary | ICD-10-CM

## 2014-01-06 DIAGNOSIS — K117 Disturbances of salivary secretion: Secondary | ICD-10-CM | POA: Insufficient documentation

## 2014-01-06 DIAGNOSIS — R05 Cough: Secondary | ICD-10-CM | POA: Insufficient documentation

## 2014-01-06 DIAGNOSIS — R1310 Dysphagia, unspecified: Secondary | ICD-10-CM

## 2014-01-06 NOTE — Procedures (Signed)
Objective Swallowing Evaluation: Modified Barium Swallowing Study  Patient Details  Name: [name] MRN: [id number] Date of Birth: 1936/11/18  Today's Date: 01/06/2014 Time: 1239-1310 SLP Time Calculation (min): 31 min  Past Medical History:  Past Medical History  Diagnosis Date  . GERD (gastroesophageal reflux disease)   . Anxiety disorder   . Arthritis   . CAD (coronary artery disease)     Cath May 2010.  Nonbstructive  . Depression   . Hypothyroid   . Asthmatic bronchitis   . Hypertension     dr Percival Spanish  . OA (osteoarthritis)   . Chronic bronchitis   . 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 Encephalitis     d/t meningitis  . PONV (postoperative nausea and vomiting)     history of cardiac arrest day 1 post surgery  in 2008   Past Surgical History:  Past Surgical History  Procedure Laterality Date  . Total knee arthroplasty      right knee  . Back surgery    . Coronary angioplasty with stent placement  2003  . Sinus surgery with instatrak    . Knee arthroscopy  03/26/2012    Procedure: ARTHROSCOPY KNEE;  Surgeon: Wylene Simmer, MD;  Location: [facility];  Service: Orthopedics;  Laterality: Left;  with Debridement of Lateral Meniscus tear  . Rotator cuff repair      bilateral  . Ankle fusion  08/27/2012    Procedure: ARTHRODESIS ANKLE;  Surgeon: Wylene Simmer, MD;  Location: [place] [facility] [place];  Service: Orthopedics;  Laterality: Right;  Arthrodesis right ankle and subtalar joint   HPI:  78 yo female referred by Dr Elsworth Soho for MBS due to concern for possible aspiration.  Pt PMH + for h/o smoking, voice changes, xerostomia, acid reflux vs sinusitis, arthritis.  Pt has been treated for chronic sinusitis with ABX, wheeze returned when ABX stopped.  She has undergone previous esophageal dilatations for hypertensive LES - 10/14/2001, 11/01/2004. She reports infrequent "strangling" when swallowing x3-4 months and pain with swallowing "bulky things"- attributes some pain to  esophageal spasms.   Pt reports ENT saw her and reported no problems with vocal cord integrity.  She states this does not occur often but can be disturbing.  Pt is taking sucralfate and a PPI daily.  Pt appeared "jittery" during evaluation - she stated this is due to her Prednisone.       Assessment / Plan / Recommendation Clinical Impression  Dysphagia Diagnosis: Within Functional Limits  Clinical impression: Pt presents with functional oropharyngeal swallow without aspiration or penetration of any consistency tested.  Pt does report xerostomia and portion of cracker adhered to palate, but she was able to effectively clear.  Pharyngeal swlalow was strong and timely without stasis.  Referrant sensation to pharynx of appearance of cracker stasis in esophagus noted- pt informed of finding.   Pt observed to cough during testing without barium visualized in larynx, trachea.  Following test, pt with frequent belching and cough - suspect symptoms are consistent with primary esophageal dysphagia.  Pt reports she is to see her GI MD mid February.  SLP advised her to results of MBS, compensations that may be helpful to compensate for known esoph issues including following solids with liquids and consuming several small meals/daily and xerostomia tips.      Treatment Recommendation  No treatment recommended at this time    Diet Recommendation Regular;Thin liquid   Liquid Administration via: Cup;Straw Medication Administration: Whole  meds with liquid (with pudding follow with liquid if problematic to swallow) Supervision: Patient able to self feed Compensations: Small sips/bites;Follow solids with liquid Postural Changes and/or Swallow Maneuvers: Out of bed for meals;Seated upright 90 degrees;Upright 30-60 min after meal    Other  Recommendations Oral Care Recommendations: Oral care BID (consider using Biotene products if md agrees)              General Date of Onset: 01/06/14 HPI: 78 yo female  referred by Dr Elsworth Soho for MBS due to concern for possible aspiration.  Pt PMH + for h/o smoking, voice changes, xerostomia, acid reflux vs sinusitis, arthritis.  Pt has been treated for chronic sinusitis with ABX, wheeze returned when ABX stopped.  She has undergone previous esophageal dilatations for hypertensive LES - 10/14/2001, 11/01/2004. She reports infrequent "strangling" when swallowing x3-4 months and pain with swallowing "bulky things"- attributes some pain to esophageal spasms.   Pt reports ENT saw her and reported no problems with vocal cord integrity.  She states this does not occur often but can be disturbing.  Pt is taking sucralfate and a PPI daily.  Pt appeared "jittery" during evaluation - she stated this is due to her Prednisone.   Type of Study: Modified Barium Swallowing Study Reason for Referral: Objectively evaluate swallowing function Diet Prior to this Study: Regular;Thin liquids Respiratory Status: Room air History of Recent Intubation: No Behavior/Cognition: Alert;Cooperative;Pleasant mood Oral Cavity - Dentition: Dentures, top;Adequate natural dentition Oral Motor / Sensory Function: Within functional limits Self-Feeding Abilities: Able to feed self Patient Positioning: Upright in chair Baseline Vocal Quality: Hoarse Volitional Cough: Strong Volitional Swallow: Able to elicit Anatomy: Within functional limits Pharyngeal Secretions: Not observed secondary MBS    Reason for Referral Objectively evaluate swallowing function   Oral Phase Oral Preparation/Oral Phase Oral Phase: WFL Oral - Nectar Oral - Nectar Cup: Within functional limits Oral - Thin Oral - Thin Cup: Within functional limits Oral - Thin Straw: Within functional limits Oral - Solids Oral - Puree: Within functional limits Oral - Regular: Within functional limits Oral - Pill: Within functional limits (pt threw her extended head upward to help transit pill - she reports this to be normal)   Pharyngeal  Phase Pharyngeal Phase Pharyngeal Phase: Within functional limits Pharyngeal - Nectar Pharyngeal - Nectar Cup: Within functional limits Pharyngeal - Thin Pharyngeal - Thin Cup: Within functional limits Pharyngeal - Thin Straw: Within functional limits Pharyngeal - Solids Pharyngeal - Puree: Within functional limits Pharyngeal - Regular: Within functional limits Pharyngeal - Pill: Within functional limits  Cervical Esophageal Phase    GO    Cervical Esophageal Phase Cervical Esophageal Phase: Impaired (appearance of osteophytes at C5-C6, C6-C7 - did not impact swallow) Cervical Esophageal Phase - Nectar Nectar Cup: Prominent cricopharyngeal segment Cervical Esophageal Phase - Thin Thin Cup: Prominent cricopharyngeal segment Thin Straw: Prominent cricopharyngeal segment Cervical Esophageal Phase - Solids Puree: Prominent cricopharyngeal segment Regular: Prominent cricopharyngeal segment Pill: Prominent cricopharyngeal segment Cervical Esophageal Phase - Comment Cervical Esophageal Comment: Appearance of slow clearance below UES and at distal esophagus, barium tablet appeared to lodge at LES - requiring several boluses of liquids to clear, Radiologist not present to confirm findings.     Functional Assessment Tool Used: MBS, clinical judgement Functional Limitations: Swallowing Swallow Current Status (W2956): At least 1 percent but less than 20 percent impaired, limited or restricted Swallow Goal Status [phone]): At least 1 percent but less than 20 percent impaired, limited or restricted Swallow Discharge Status [phone]):  At least 1 percent but less than 20 percent impaired, limited or restricted    Claudie Fisherman, [facility] SLP [phone]

## 2014-01-11 ENCOUNTER — Telehealth: Payer: Self-pay | Admitting: Internal Medicine

## 2014-01-11 NOTE — Telephone Encounter (Signed)
Pt states she has a new insurance company that requires a prior authorization for her Zegerid.  I called insurance company who are sending me the form to fill out to initiate the prior authorization.  Will call patient as soon as I get the approval.

## 2014-01-12 ENCOUNTER — Ambulatory Visit: Payer: Medicare Other | Admitting: Family Medicine

## 2014-01-13 ENCOUNTER — Other Ambulatory Visit: Payer: Self-pay | Admitting: Pharmacist

## 2014-01-13 ENCOUNTER — Encounter: Payer: Self-pay | Admitting: Family Medicine

## 2014-01-13 ENCOUNTER — Ambulatory Visit (INDEPENDENT_AMBULATORY_CARE_PROVIDER_SITE_OTHER): Payer: Medicare HMO | Admitting: Family Medicine

## 2014-01-13 VITALS — BP 143/92 | HR 67 | Temp 97.9°F | Ht 62.0 in | Wt 153.0 lb

## 2014-01-13 DIAGNOSIS — E559 Vitamin D deficiency, unspecified: Secondary | ICD-10-CM

## 2014-01-13 DIAGNOSIS — I10 Essential (primary) hypertension: Secondary | ICD-10-CM

## 2014-01-13 DIAGNOSIS — F411 Generalized anxiety disorder: Secondary | ICD-10-CM

## 2014-01-13 DIAGNOSIS — E785 Hyperlipidemia, unspecified: Secondary | ICD-10-CM

## 2014-01-13 DIAGNOSIS — M25579 Pain in unspecified ankle and joints of unspecified foot: Secondary | ICD-10-CM

## 2014-01-13 DIAGNOSIS — K219 Gastro-esophageal reflux disease without esophagitis: Secondary | ICD-10-CM

## 2014-01-13 DIAGNOSIS — IMO0001 Reserved for inherently not codable concepts without codable children: Secondary | ICD-10-CM

## 2014-01-13 DIAGNOSIS — Z23 Encounter for immunization: Secondary | ICD-10-CM

## 2014-01-13 DIAGNOSIS — E039 Hypothyroidism, unspecified: Secondary | ICD-10-CM

## 2014-01-13 DIAGNOSIS — M25571 Pain in right ankle and joints of right foot: Secondary | ICD-10-CM

## 2014-01-13 DIAGNOSIS — M797 Fibromyalgia: Secondary | ICD-10-CM

## 2014-01-13 DIAGNOSIS — I4891 Unspecified atrial fibrillation: Secondary | ICD-10-CM

## 2014-01-13 LAB — POCT CBC
Granulocyte percent: 50 %G (ref 37–80)
HCT, POC: 41.9 % (ref 37.7–47.9)
Hemoglobin: 13.5 g/dL (ref 12.2–16.2)
Lymph, poc: 5.5 — AB (ref 0.6–3.4)
MCH, POC: 30.3 pg (ref 27–31.2)
MCHC: 32.3 g/dL (ref 31.8–35.4)
MCV: 94 fL (ref 80–97)
MPV: 7 fL (ref 0–99.8)
POC Granulocyte: 6.2 (ref 2–6.9)
POC LYMPH PERCENT: 45 %L (ref 10–50)
Platelet Count, POC: 296 10*3/uL (ref 142–424)
RBC: 4.5 M/uL (ref 4.04–5.48)
RDW, POC: 13.4 %
WBC: 12.3 10*3/uL — AB (ref 4.6–10.2)

## 2014-01-13 NOTE — Patient Instructions (Addendum)
Continue current medications. Continue good therapeutic lifestyle changes which include good diet and exercise. Fall precautions discussed with patient. Schedule your flu vaccine if you haven't had it yet If you are over 76 years old - you may need Prevnar 42 or the adult Pneumonia vaccine. You will be scheduled for a mammogram, pelvic and Pap smear, and DEXA scan You will receive your prolia injection today Procedure Prevnar injection today and this may make your arm sore use your current mist humidifier in her bedroom at nighttime. Continued followup with the pulmonologists gastroenterologists and cardiologist. Also keep your appointment with the orthopedist.

## 2014-01-13 NOTE — Addendum Note (Signed)
Addended by: Zannie Cove on: 01/13/2014 09:16 AM   Modules accepted: Orders

## 2014-01-13 NOTE — Progress Notes (Signed)
Subjective:    Patient ID: [name], female    DOB: [birth year], 78 y.o.   MRN: [id number]  HPI Pt here for follow up and management of chronic medical problems. The patient is followed by multiple specialists from pulmonology, and gastroenterology, and cardiology. She is . She will get lab work drawn today and may need to get a Prevnar vaccine.due to get her mammogram and pelvic exam. She is also due for DEXA scan and needs her Prolia injection.         Patient Active Problem List   Diagnosis Date Noted  . Fibromyalgia 08/09/2013  . REACTIVE AIRWAY DISEASE 08/01/2010  . HYPOTHYROIDISM 03/20/2010  . DEPRESSION 03/20/2010  . HYPERTENSION 03/20/2010  . ESOPHAGEAL MOTILITY DISORDER 03/20/2010  . CHEST PAIN 05/03/2009  . HYPERLIPIDEMIA 04/22/2009  . Cor athrscl-uns vessel 04/22/2009  . ATRIAL FIBRILLATION, PAROXYSMAL 04/22/2009  . ESOPHAGEAL STRICTURE 10/31/2004  . GERD 10/31/2004  . HIATAL HERNIA 10/06/2001   Outpatient Encounter Prescriptions as of 01/13/2014  Medication Sig  . albuterol (PROVENTIL HFA;VENTOLIN HFA) 108 (90 BASE) MCG/ACT inhaler Inhale 2 puffs into the lungs every 6 (six) hours as needed for wheezing.  Marland Kitchen albuterol (PROVENTIL) (2.5 MG/3ML) 0.083% nebulizer solution Take 3 mLs (2.5 mg total) by nebulization 4 (four) times daily as needed.  . ALPRAZolam (XANAX) 0.5 MG tablet Take 2 tablets (1 mg total) by mouth at bedtime as needed. For sleep  . Ascorbic Acid (VITAMIN C PO) Take 1 tablet by mouth every morning.  . Calcium Carbonate-Vitamin D (CALCIUM 600+D) 600-400 MG-UNIT per tablet Take 1 tablet by mouth daily at 6 PM.   . cholecalciferol (VITAMIN D) 1000 UNITS tablet Take 1,000 Units by mouth 2 (two) times daily with breakfast and lunch.   . CRESTOR 20 MG tablet TAKE 1 TABLET ONCE A DAY  . cyclobenzaprine (FLEXERIL) 10 MG tablet TAKE 1/2 TO 1 TABLET 3 TIMES DAILY AS NEEDED  . dextromethorphan-guaiFENesin (MUCINEX DM) 30-600 MG per 12 hr tablet Take 1 tablet  by mouth every 12 (twelve) hours. scheduled  . dicyclomine (BENTYL) 10 MG capsule Take 1-2 capsules (10-20 mg total) by mouth every 6 (six) hours as needed. For spasms  . fluticasone (FLONASE) 50 MCG/ACT nasal spray 2 SPRAYS IN EACH NOSTRIL ONCE A DAY  . isosorbide mononitrate (IMDUR) 60 MG 24 hr tablet TAKE 1 TABLET ONCE A DAY  . levothyroxine (SYNTHROID, LEVOTHROID) 25 MCG tablet TAKE 2 TABLETS BY MOUTH DAILY AS DIRECTED  . loratadine (CLARITIN) 10 MG tablet TAKE 1 TABLET ONCE A DAY  . metoprolol (LOPRESSOR) 50 MG tablet Take 1 tablet (50 mg total) by mouth 2 (two) times daily.  . montelukast (SINGULAIR) 10 MG tablet TAKE 1 TABLET ONCE A DAY  . Multiple Vitamin (MULTIVITAMIN WITH MINERALS) TABS Take 1 tablet by mouth daily.  . nitroGLYCERIN (NITROSTAT) 0.4 MG SL tablet Place 0.4 mg under the tongue every 5 (five) minutes as needed. For chest pain  . omega-3 acid ethyl esters (LOVAZA) 1 G capsule TAKE (2) CAPSULES TWICE DAILY.  Marland Kitchen omeprazole-sodium bicarbonate (ZEGERID) 40-1100 MG per capsule TAKE (1) CAPSULE DAILY  . ondansetron (ZOFRAN) 4 MG tablet TAKE ONE TABLET BY MOUTH EVERY TWELVE HOURS AS NEEDED FOR NAUSEA  . predniSONE (DELTASONE) 10 MG tablet Take 4 tabs  daily with food x 4 days, then 3 tabs daily x 4 days, then 2 tabs daily x 4 days, then 1 tab daily x4 days then stop. #40  . sertraline (ZOLOFT) 100 MG tablet  TAKE 1 TABLET ONCE A DAY  . VITAMIN E PO Take 1 capsule by mouth every morning.  Alveda Reasons 20 MG TABS tablet TAKE 1 TABLET ONCE A DAY  . acetaminophen (TYLENOL) 325 MG tablet Take 650 mg by mouth every 6 (six) hours as needed. For pain/fever  . [DISCONTINUED] budesonide (PULMICORT) 0.5 MG/2ML nebulizer solution Take 2 mLs (0.5 mg total) by nebulization 2 (two) times daily. DX 493.90  . [DISCONTINUED] HYDROcodone-homatropine (HYDROMET) 5-1.5 MG/5ML syrup Take 2.5 mLs by mouth every 8 (eight) hours as needed for cough.  . [DISCONTINUED] albuterol (PROVENTIL) (5 MG/ML) 0.5%  nebulizer solution 2.5 mg   . [DISCONTINUED] budesonide (PULMICORT) nebulizer solution 0.25 mg   . [DISCONTINUED] budesonide (PULMICORT) nebulizer solution 0.25 mg     Review of Systems  Constitutional: Negative.   HENT: Negative.   Eyes: Negative.   Respiratory: Negative.        On prednisone now from pulmon.  Cardiovascular: Negative.   Gastrointestinal: Negative.   Endocrine: Negative.   Genitourinary: Negative.   Musculoskeletal: Positive for arthralgias (right foot).  Skin: Negative.   Allergic/Immunologic: Negative.   Neurological: Negative.   Hematological: Negative.   Psychiatric/Behavioral: Negative.        Objective:   Physical Exam  Nursing note and vitals reviewed. Constitutional: She is oriented to person, place, and time. She appears well-developed and well-nourished. No distress.  HENT:  Head: Normocephalic and atraumatic.  Right Ear: External ear normal.  Left Ear: External ear normal.  Mouth/Throat: Oropharynx is clear and moist. No oropharyngeal exudate.  Nasal passages are dry  Eyes: Conjunctivae and EOM are normal. Pupils are equal, round, and reactive to light. Right eye exhibits no discharge. Left eye exhibits no discharge. No scleral icterus.  Neck: Normal range of motion. Neck supple. No thyromegaly present.  No carotid bruits are auscultated  Cardiovascular: Normal rate, regular rhythm, normal heart sounds and intact distal pulses.  Exam reveals no gallop and no friction rub.   No murmur heard. The rhythm is regular today at 72 per minute  Pulmonary/Chest: Effort normal and breath sounds normal. No respiratory distress. She has no wheezes. She has no rales. She exhibits no tenderness.  No axillary nodes, the chest is clear today without any wheezes or rhonchi  Abdominal: Soft. Bowel sounds are normal. She exhibits no mass. There is tenderness. There is no rebound and no guarding.  There is slight tenderness in the left lower quadrant    Genitourinary:  Both breast were examined today and there were no lumps masses or axillary adenopathy  Musculoskeletal: She exhibits no edema and no tenderness.  Range of motion and is hesitant and limited secondary to pain and right ankle for which she is seeing the orthopedic specialist  Lymphadenopathy:    She has no cervical adenopathy.  Neurological: She is alert and oriented to person, place, and time. No cranial nerve deficit.  Skin: Skin is warm and dry. No rash noted.  Psychiatric: She has a normal mood and affect. Her behavior is normal. Judgment and thought content normal.  Increased anxiety secondary to prednisone   BP 143/92  Pulse 67  Temp(Src) 97.9 F (36.6 C) (Oral)  Ht '5\' 2"'  (1.575 m)  Wt 153 lb (69.4 kg)  BMI 27.98 kg/m2        Assessment & Plan:  1. ATRIAL FIBRILLATION, PAROXYSMAL - POCT CBC  2. Fibromyalgia - POCT CBC  3. GERD - POCT CBC  4. HYPERLIPIDEMIA - POCT CBC - NMR,  lipoprofile  5. HYPERTENSION - POCT CBC - BMP8+EGFR - Hepatic function panel  6. HYPOTHYROIDISM - POCT CBC - Thyroid Panel With TSH  7. Vitamin D deficiency - Vit D  25 hydroxy (rtn osteoporosis monitoring)  8. Generalized anxiety disorder  9. Right ankle pain Patient Instructions  Continue current medications. Continue good therapeutic lifestyle changes which include good diet and exercise. Fall precautions discussed with patient. Schedule your flu vaccine if you haven't had it yet If you are over 82 years old - you may need Prevnar 4 or the adult Pneumonia vaccine. You will be scheduled for a mammogram, pelvic and Pap smear, and DEXA scan You will receive your prolia injection today Procedure Prevnar injection today and this may make your arm sore use your current mist humidifier in her bedroom at nighttime. Continued followup with the pulmonologists gastroenterologists and cardiologist. Also keep your appointment with the orthopedist.     Arrie Senate  MD

## 2014-01-15 LAB — NMR, LIPOPROFILE
Cholesterol: 178 mg/dL (ref <200)
HDL Cholesterol by NMR: 102 mg/dL (ref >=40)
HDL Particle Number: 37.9 umol/L (ref >=30.5)
LDL Particle Number: 465 nmol/L (ref <1000)
LDL Size: 20.9 nm (ref >20.5)
LDLC SERPL CALC-MCNC: 53 mg/dL (ref <100)
LP-IR Score: 30 (ref <=45)
Small LDL Particle Number: <90 nmol/L (ref <=527)
Triglycerides by NMR: 116 mg/dL (ref <150)

## 2014-01-15 LAB — HEPATIC FUNCTION PANEL
ALT: 18 IU/L (ref 0–32)
AST: 18 IU/L (ref 0–40)
Albumin: 4.5 g/dL (ref 3.5–4.8)
Alkaline Phosphatase: 80 IU/L (ref 39–117)
Bilirubin, Direct: 0.21 mg/dL (ref 0.00–0.40)
Total Bilirubin: 0.6 mg/dL (ref 0.0–1.2)
Total Protein: 7.2 g/dL (ref 6.0–8.5)

## 2014-01-15 LAB — BMP8+EGFR
BUN/Creatinine Ratio: 15 (ref 11–26)
BUN: 11 mg/dL (ref 8–27)
CO2: 26 mmol/L (ref 18–29)
Calcium: 9.4 mg/dL (ref 8.7–10.3)
Chloride: 92 mmol/L — ABNORMAL LOW (ref 97–108)
Creatinine, Ser: 0.74 mg/dL (ref 0.57–1.00)
GFR calc Af Amer: 92 mL/min/{1.73_m2} (ref >59)
GFR calc non Af Amer: 80 mL/min/{1.73_m2} (ref >59)
Glucose: 73 mg/dL (ref 65–99)
Potassium: 4.3 mmol/L (ref 3.5–5.2)
Sodium: 134 mmol/L (ref 134–144)

## 2014-01-15 LAB — THYROID PANEL WITH TSH
Free Thyroxine Index: 2.2 (ref 1.2–4.9)
T3 Uptake Ratio: 32 % (ref 24–39)
T4, Total: 7 ug/dL (ref 4.5–12.0)
TSH: 3.17 u[IU]/mL (ref 0.450–4.500)

## 2014-01-15 LAB — VITAMIN D 25 HYDROXY (VIT D DEFICIENCY, FRACTURES): Vit D, 25-Hydroxy: 51.5 ng/mL (ref 30.0–100.0)

## 2014-01-17 ENCOUNTER — Other Ambulatory Visit: Payer: Self-pay | Admitting: Family Medicine

## 2014-01-20 ENCOUNTER — Encounter: Payer: Self-pay | Admitting: Pharmacist

## 2014-01-26 ENCOUNTER — Telehealth: Payer: Self-pay | Admitting: Pharmacist

## 2014-01-26 MED ORDER — DENOSUMAB 60 MG/ML ~~LOC~~ SOLN: 60.0000 mg | Freq: Once | SUBCUTANEOUS | Status: DC

## 2014-01-26 NOTE — Telephone Encounter (Signed)
prolia is covered through pharmacy benefits. Rx called to [facility] - cost is $6.60 for patient.  THey are ordering medicaiton and patient will pick up tomorrow an dbring to office for injeciton.

## 2014-01-27 ENCOUNTER — Encounter: Payer: Self-pay | Admitting: Pharmacist

## 2014-01-27 ENCOUNTER — Ambulatory Visit: Payer: Medicare Other | Admitting: Adult Health

## 2014-01-27 ENCOUNTER — Ambulatory Visit (INDEPENDENT_AMBULATORY_CARE_PROVIDER_SITE_OTHER): Payer: Medicare HMO | Admitting: Pharmacist

## 2014-01-27 DIAGNOSIS — M810 Age-related osteoporosis without current pathological fracture: Secondary | ICD-10-CM

## 2014-01-27 MED ORDER — DENOSUMAB 60 MG/ML ~~LOC~~ SOLN
60.0000 mg | Freq: Once | SUBCUTANEOUS | Status: AC
  Administered 2014-01-27: 60 mg via SUBCUTANEOUS

## 2014-01-27 NOTE — Progress Notes (Signed)
This is actually not an anticoagulation note.  Patient is here to receive first Prolia injection for osteoporosis. She took Forteo for 2 years with good results but needed something to help maintain BMD built during [facility] therapy.  She is unable to take bisphosphonates due to esophogeal pain. Last Dexa was 06/2012 - next due 06/2014.  Assessment:  Osteoporosis  Plan:  Prolia 60mg  injection given today in left arm SQ.   Continue Calcium intake - goal 1200mg  from supplementation and diet.  Patient currently unable to exercise due to foot pain - considering surgery.  Order sent for dexa in July 2015.   Cherre Robins, PharmD, CPP

## 2014-01-27 NOTE — Telephone Encounter (Signed)
[employer] to check on status of Zegerid prior authorization.  Was told rx was approved.  Called pharmacy to communicate information and they told me it could be picked up tomorrow.  Called patient and told her when her Zegerid would be available.  Patient acknowledged and understood

## 2014-01-31 ENCOUNTER — Ambulatory Visit: Payer: Medicare Other | Admitting: Internal Medicine

## 2014-02-03 ENCOUNTER — Telehealth: Payer: Self-pay | Admitting: Cardiology

## 2014-02-03 ENCOUNTER — Other Ambulatory Visit: Payer: Self-pay | Admitting: Family Medicine

## 2014-02-03 NOTE — Telephone Encounter (Signed)
Received request from Nurse fax box, documents faxed for surgical clearance. °To: [facility] °Fax number: [phone] °Attention: °2.19.15/kdm °

## 2014-02-15 ENCOUNTER — Ambulatory Visit: Payer: Medicare HMO | Admitting: Nurse Practitioner

## 2014-02-17 ENCOUNTER — Other Ambulatory Visit: Payer: Self-pay | Admitting: Family Medicine

## 2014-03-01 ENCOUNTER — Other Ambulatory Visit: Payer: Self-pay | Admitting: Family Medicine

## 2014-03-02 ENCOUNTER — Telehealth: Payer: Self-pay | Admitting: Pulmonary Disease

## 2014-03-02 NOTE — Telephone Encounter (Signed)
Called and spoke with pt. She reports she is scheduled to have surgery on 03/31/14 ankle reconstruction under general anesthesia. Dr. Doran Durand w/ [facility]. She wants to know if RA needs to see her prior to this. Please advise thanks

## 2014-03-03 ENCOUNTER — Ambulatory Visit: Payer: Medicare HMO | Admitting: Adult Health

## 2014-03-03 NOTE — Telephone Encounter (Signed)
Spoke with patient-she is aware of RA wanting her to see TP prior to surgery. Pt requested to have appt on 03-22-14; she is scheduled at 2:00pm that day.

## 2014-03-03 NOTE — Telephone Encounter (Signed)
Pt just seen 01/13/14 with labs but there was a request for Xarelto and in Epic pt is scheduled for surgery in April. Please review.

## 2014-03-03 NOTE — Telephone Encounter (Signed)
OK to make appt with TP

## 2014-03-07 ENCOUNTER — Telehealth: Payer: Self-pay | Admitting: Family Medicine

## 2014-03-07 DIAGNOSIS — R5383 Other fatigue: Principal | ICD-10-CM

## 2014-03-07 DIAGNOSIS — R5381 Other malaise: Secondary | ICD-10-CM

## 2014-03-07 NOTE — Telephone Encounter (Signed)
Cannot give B12 shots unless she has a low B12 level Jamie, please call and find out what she needs to know about her surgery Also please give her a copy of her blood work

## 2014-03-07 NOTE — Telephone Encounter (Signed)
Patient wanted to let you know her foot surgery was 03/31/14. And also she feels tired all the time no energy what do you think about her getting B12 injections?  Copy of labs mailed to patient.Marland KitchenMarland KitchenMarland Kitchen

## 2014-03-09 NOTE — Telephone Encounter (Signed)
Order for B12 level placed  Pt aware to come by for lab

## 2014-03-10 ENCOUNTER — Encounter: Payer: Self-pay | Admitting: Internal Medicine

## 2014-03-10 ENCOUNTER — Ambulatory Visit: Payer: Medicare HMO | Admitting: Internal Medicine

## 2014-03-10 ENCOUNTER — Ambulatory Visit (INDEPENDENT_AMBULATORY_CARE_PROVIDER_SITE_OTHER): Payer: Medicare HMO | Admitting: Internal Medicine

## 2014-03-10 VITALS — BP 136/88 | HR 72 | Ht 60.0 in | Wt 157.0 lb

## 2014-03-10 DIAGNOSIS — Z7901 Long term (current) use of anticoagulants: Secondary | ICD-10-CM

## 2014-03-10 DIAGNOSIS — R1310 Dysphagia, unspecified: Secondary | ICD-10-CM

## 2014-03-10 DIAGNOSIS — K219 Gastro-esophageal reflux disease without esophagitis: Secondary | ICD-10-CM

## 2014-03-10 DIAGNOSIS — R198 Other specified symptoms and signs involving the digestive system and abdomen: Secondary | ICD-10-CM

## 2014-03-10 DIAGNOSIS — R194 Change in bowel habit: Secondary | ICD-10-CM

## 2014-03-10 DIAGNOSIS — Z1211 Encounter for screening for malignant neoplasm of colon: Secondary | ICD-10-CM

## 2014-03-10 NOTE — Patient Instructions (Signed)
Please follow up with Dr. Henrene Pastor in the early fall, after recovering from your ankle surgery.

## 2014-03-10 NOTE — Progress Notes (Signed)
HISTORY OF PRESENT ILLNESS:  [name] is a 78 y.o. female with multiple significant medical problems including a history of atrial fibrillation for which she is on Xarelto. She is sent today by her pulmonologist regarding GERD as a possible cause for pulmonary symptoms (asthmatic bronchitis and hoarseness) after a modified barium swallow (reviewed) January 2015. There was no evidence for aspiration. However the speech pathologist clinical impression suggested that the patient may have esophageal related problems. Thus, the referral. Patient also has other GI issues including problems with her bowel habits and she is interested in followup screening colonoscopy. I have not seen the patient in 4 years. Relevant interval laboratory and x-ray data reviewed. She does have a history of GERD, esophageal stricture, and hypertensive LES on manometry. Her last upper endoscopy with Dr. Olevia [name] was performed May 2010. Examination was normal. Her esophagus was dilated. She states that this helped her dysphagia. For chronic reflux she has been on Zegerid with good control of classic symptoms. Terms of her bowels, she describes going to the bathroom with increased frequency, though no diarrhea. Sensation of incomplete evacuation. No bleeding. She does have gas and bloating as well as belching. Last colonoscopy was September 2005 examination was normal. She's anticipating foot surgery in 3 weeks. She does tell me that her hoarseness has improved with change in breathing treatments.  REVIEW OF SYSTEMS:  All non-GI ROS negative except for sinus and allergy trouble, arthritis, back pain, depression, fatigue, hearing problems, heart rhythm change, muscle pains, shortness of breath, sleeping problems, ankle edema, increased thirst, urinary frequency, raspy voice  Past Medical History  Diagnosis Date  . GERD (gastroesophageal reflux disease)   . Anxiety disorder   . Arthritis   . CAD (coronary artery disease)     Cath  May 2010.  Nonbstructive  . Depression   . Hypothyroid   . Asthmatic bronchitis   . Hypertension     dr [name]  . OA (osteoarthritis)   . Chronic bronchitis   . 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 Encephalitis     d/t meningitis  . PONV (postoperative nausea and vomiting)     history of cardiac arrest day 1 post surgery  in 2008  . Esophageal motility disorder   . Atrial fibrillation   . Hyperlipidemia   . Fibromyalgia   . Vitamin D deficiency   . Status post dilation of esophageal narrowing   . Bowel obstruction     blockage    Past Surgical History  Procedure Laterality Date  . Total knee arthroplasty Right   . Back surgery    . Coronary angioplasty with stent placement  2003  . Sinus surgery with instatrak    . Knee arthroscopy  03/26/2012    Procedure: ARTHROSCOPY KNEE;  Surgeon: [name] Simmer, MD;  Location: [place];  Service: Orthopedics;  Laterality: Left;  with Debridement of Lateral Meniscus tear  . Rotator cuff repair Bilateral   . Ankle fusion  08/27/2012    Procedure: ARTHRODESIS ANKLE;  Surgeon: [name] Simmer, MD;  Location: [place];  Service: Orthopedics;  Laterality: Right;  Arthrodesis right ankle and subtalar joint    Social History [name]  reports that she quit smoking about 24 years ago. Her smoking use included Cigarettes. She has a 15 pack-year smoking history. She has never used smokeless tobacco. She reports that she does not drink alcohol or use illicit drugs.  family history includes Asthma in  her mother; Emphysema in her sister; Heart disease in her mother; Prostate cancer in her brother.  Allergies  Allergen Reactions  . Aspirin Other (See Comments)    REACTION: regular strength causes "heart to beat fast"  . Captopril Hypertension  . Naproxen Other (See Comments)    Tongue swelling  . Penicillins     Swelling around site  . Sulfonamide Derivatives Hives  . Clindamycin/Lincomycin        PHYSICAL  EXAMINATION: Vital signs: BP 136/88  Pulse 72  Ht 5' (1.524 m)  Wt 157 lb (71.215 kg)  BMI 30.66 kg/m2 General: Well-developed, well-nourished, no acute distress. Hampered mobility HEENT: Sclerae are anicteric, conjunctiva pink. Oral mucosa intact Lungs: Clear Heart: Regular Abdomen: soft, nontender, nondistended, no obvious ascites, no peritoneal signs, normal bowel sounds. No organomegaly. Extremities: No edema Psychiatric: alert and oriented x3. Cooperative   ASSESSMENT:  #1. GERD. Classic symptoms controlled with PPI #2. Intermittent solid food dysphagia. Responded to dilation in 2010. Suspect subtle ring or stricture #3. Change in bowel habits. Nonspecific. No alarm features #4. Colonoscopy September 2005. Normal. Patient due for followup. Interested. I discussed her borderline age or routine followup screening. She understands #5. Pulmonary complaints. May or may not be related to GERD. Suspect not #6. Multiple medical problems. On Xarelto   PLAN:  #1. Continue PPI #2. Reflux precautions #3. Fiber supplementation for bowels #4. Upper endoscopy with possible esophageal dilation and screening colonoscopy.The nature of the procedure, as well as the risks, benefits, and alternatives were carefully and thoroughly reviewed with the patient. Ample time for discussion and questions allowed. The patient understood, was satisfied, and agreed to proceed. The patient is higher than baseline risk due to her general medical problems and Xarelto. There is no urgency to the examinations. Patient wishes to wait until she has recovered fully from foot surgery. She will make a followup appointment with me in the office at that time and if appropriate we can arrange her endoscopic evaluations.

## 2014-03-15 ENCOUNTER — Other Ambulatory Visit: Payer: Self-pay | Admitting: Family Medicine

## 2014-03-16 NOTE — Telephone Encounter (Signed)
Do not see med on patients current med list. Please advise

## 2014-03-17 ENCOUNTER — Encounter (HOSPITAL_COMMUNITY): Payer: Self-pay | Admitting: Pharmacy Technician

## 2014-03-18 ENCOUNTER — Other Ambulatory Visit: Payer: Self-pay | Admitting: Family Medicine

## 2014-03-22 ENCOUNTER — Encounter (HOSPITAL_COMMUNITY): Payer: Self-pay

## 2014-03-22 ENCOUNTER — Encounter (HOSPITAL_COMMUNITY)
Admission: RE | Admit: 2014-03-22 | Discharge: 2014-03-22 | Disposition: A | Discharge status: Home / Self Care | Payer: Medicare Other | Source: Ambulatory Visit | Attending: Orthopedic Surgery | Admitting: Orthopedic Surgery

## 2014-03-22 ENCOUNTER — Ambulatory Visit: Payer: Medicare HMO | Admitting: Adult Health

## 2014-03-22 DIAGNOSIS — Z01810 Encounter for preprocedural cardiovascular examination: Secondary | ICD-10-CM | POA: Insufficient documentation

## 2014-03-22 DIAGNOSIS — Z01812 Encounter for preprocedural laboratory examination: Secondary | ICD-10-CM | POA: Insufficient documentation

## 2014-03-22 LAB — CBC
HCT: 38.8 % (ref 36.0–46.0)
Hemoglobin: 13.7 g/dL (ref 12.0–15.0)
MCH: 32.4 pg (ref 26.0–34.0)
MCHC: 35.3 g/dL (ref 30.0–36.0)
MCV: 91.7 fL (ref 78.0–100.0)
Platelets: 291 10*3/uL (ref 150–400)
RBC: 4.23 MIL/uL (ref 3.87–5.11)
RDW: 12.5 % (ref 11.5–15.5)
WBC: 7.2 10*3/uL (ref 4.0–10.5)

## 2014-03-22 LAB — BASIC METABOLIC PANEL
BUN: 14 mg/dL (ref 6–23)
CO2: 25 mEq/L (ref 19–32)
Calcium: 9.4 mg/dL (ref 8.4–10.5)
Chloride: 97 mEq/L (ref 96–112)
Creatinine, Ser: 0.54 mg/dL (ref 0.50–1.10)
GFR calc Af Amer: >90 mL/min (ref >90)
GFR calc non Af Amer: >90 mL/min (ref >90)
Glucose, Bld: 96 mg/dL (ref 70–99)
Potassium: 5 mEq/L (ref 3.7–5.3)
Sodium: 136 mEq/L — ABNORMAL LOW (ref 137–147)

## 2014-03-22 NOTE — Telephone Encounter (Signed)
Patient last seen in office on 01-13-14. Last rf on 02-17-14 for #60. Please advise. If approved please route to Pool A so nurse can phone in to pharmacy

## 2014-03-22 NOTE — Pre-Procedure Instructions (Signed)
[name]  03/22/2014   Your procedure is scheduled on:  Thursday, April 16 @ 1:00 pm  Report to [place] Entrance "A", Admitting office at 1100  AM.  Call this number if you have problems the morning of surgery: [phone]   Remember:   Do not eat food or drink liquids after midnight.Wednesday night   Take these medicines the morning of surgery with A SIP OF WATER: Levothyroxine, loratadine(Claritin),Metoprolol (Lopressor)                Stop Xaralto 3 days before surgery (April 13). Stop Lovaza and aspirin 5 days before surgery (April 11)   Do not wear jewelry, make-up or nail polish.  Do not wear lotions, powders, or perfumes. You may wear deodorant.  Do not shave 48 hours prior to surgery.   Do not bring valuables to the hospital.  [facility] is not responsible  for any belongings or valuables.               Contacts, dentures or bridgework may not be worn into surgery.  Leave suitcase in the car. After surgery it may be brought to your room.  For patients admitted to the hospital, discharge time is determined by your   treatment team.               Patients discharged the day of surgery will not be allowed to drive  home.  Name and phone number of your driver:      Special Instructions: [facility] - Preparing for Surgery  Before surgery, you can play an important role.  Because skin is not sterile, your skin needs to be as free of germs as possible.  You can reduce the number of germs on you skin by washing with CHG (chlorahexidine gluconate) soap before surgery.  CHG is an antiseptic cleaner which kills germs and bonds with the skin to continue killing germs even after washing.  Please DO NOT use if you have an allergy to CHG or antibacterial soaps.  If your skin becomes reddened/irritated stop using the CHG and inform your nurse when you arrive at Short Stay.  Do not shave (including legs and underarms) for at least 48 hours prior to the first CHG shower.  You  may shave your face.  Please follow these instructions carefully:   1.  Shower with CHG Soap the night before surgery and the    morning of Surgery.  2.  If you choose to wash your hair, wash your hair first as usual with your normal shampoo.  3.  After you shampoo, rinse your hair and body thoroughly to remove the Shampoo.  4.  Use CHG as you would any other liquid soap.  You can apply chg directly   to the skin and wash gently with scrungie or a clean washcloth.  5.  Apply the CHG Soap to your body ONLY FROM THE NECK DOWN.        Do not use on open wounds or open sores.  Avoid contact with your eyes,  ears, mouth and genitals (private parts).  Wash genitals (private parts)    with your normal soap.  6.  Wash thoroughly, paying special attention to the area where your surgery   will be performed.  7.  Thoroughly rinse your body with warm water from the neck down.  8.  DO NOT shower/wash with your normal soap after using and rinsing off  the CHG Soap.  9.  Pat yourself dry with a clean towel.            10.  Wear clean pajamas.            11.  Place clean sheets on your bed the night of your first shower and do not  sleep with pets.  Day of Surgery  Do not apply any lotions/deoderants the morning of surgery.  Please wear clean clothes to the hospital/surgery center.     Please read over the following fact sheets that you were given: Pain Booklet, Coughing and Deep Breathing and Surgical Site Infection Prevention

## 2014-03-22 NOTE — Telephone Encounter (Signed)
Please call in xanax with 0 refills 

## 2014-03-22 NOTE — Telephone Encounter (Signed)
Called refill authorization into [facility] 

## 2014-03-23 NOTE — Progress Notes (Signed)
Anesthesia Chart Review:  Patient is a 78 year old female scheduled for removal of deep implants X 3 (right) and revision of right subtalor arthrodesis on 03/31/14 by Dr. Doran Durand.  History included CAD s/p ramus stent '03, anxiety, arthritis, depression, HTN, hypothyroidism, asthmatic bronchitis, fibromyalgia, bowel obstruction, GERD, former smoker, post-operative N/V, possible bacterial meningitis (felt unlikely by ID) following right TKA '08, prior sinus, knee and back surgeries, right subtalar arthrodesis 08/27/12, s/p left knee arthroscopy 03/26/12. Pulmonologist is Dr. Elsworth Soho. PCP is Dr. Redge Gainer. GI Dr. Scarlette Shorts. Her Cardiologist is Dr. Percival Spanish who cleared patient for this procedure with permission to hold Xarelto 3 days prior to surgery.    EKG on 03/22/14 showed NSR with first degree AVB.   Patient had a normal nuclear stress test on 02/26/12, EF 72%.   Cardiac cath on 05/04/09 showed normal left main, proximal and mid LAD with 20-30% lesions, DIAG 1/2 small without critical disease. Ramus branch widely patent with 20-30% in-stent restenosis, CX was nondominant with 30-40% mid stenosis. RCA was dominant and WNL. EF 60%   CXR on 12/30/13 showed:  Minimal enlargement of cardiac silhouette and chronic bronchitic changes. No acute abnormalities.  Preoperative labs noted.  She will need a PTT on the day of surgery due to Xarelto (stopping three days prior).  If no acute changes then I anticipate that she can proceed as planned.  [facility] Short Stay Center/Anesthesiology Phone [phone] 03/23/2014 2:52 PM

## 2014-03-28 ENCOUNTER — Ambulatory Visit: Payer: Medicare HMO | Admitting: Adult Health

## 2014-03-30 ENCOUNTER — Other Ambulatory Visit: Payer: Self-pay | Admitting: Orthopedic Surgery

## 2014-03-30 MED ORDER — CEFAZOLIN SODIUM-DEXTROSE 2-3 GM-% IV SOLR
2.0000 g | INTRAVENOUS | Status: AC
  Administered 2014-03-31: 2 g via INTRAVENOUS
  Filled 2014-03-30: qty 50

## 2014-03-30 MED ORDER — SODIUM CHLORIDE 0.9 % IV SOLN: INTRAVENOUS | Status: DC

## 2014-03-30 MED ORDER — CHLORHEXIDINE GLUCONATE 4 % EX LIQD
60.0000 mL | Freq: Once | CUTANEOUS | Status: DC
  Filled 2014-03-30: qty 60

## 2014-03-30 NOTE — H&P (Signed)
[name] is an 78 y.o. female.   Chief Complaint: Right ankle pain HPI: Pt reports to OR for removal of deep implants to the right ankle and revision of right subtalar arthrodesis.  Pt has been experiencing unbearable pain to the right ankle since previous subtalar arthrodesis.  Pt denies N/V/F/C, chest pain, SOB, calf pain, or changes in appetite.  Past Medical History  Diagnosis Date  . GERD (gastroesophageal reflux disease)   . Anxiety disorder   . Arthritis   . CAD (coronary artery disease)     Cath May 2010.  Nonbstructive  . Depression   . Hypothyroid   . Asthmatic bronchitis   . Hypertension     dr Percival Spanish  . OA (osteoarthritis)   . Chronic bronchitis   . 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 Encephalitis     d/t meningitis  . PONV (postoperative nausea and vomiting)     history of cardiac arrest day 1 post surgery  in 2008  . Esophageal motility disorder   . Atrial fibrillation   . Hyperlipidemia   . Fibromyalgia   . Vitamin D deficiency   . Status post dilation of esophageal narrowing   . Bowel obstruction     blockage    Past Surgical History  Procedure Laterality Date  . Total knee arthroplasty Right   . Back surgery    . Coronary angioplasty with stent placement  2003  . Sinus surgery with instatrak    . Knee arthroscopy  03/26/2012    Procedure: ARTHROSCOPY KNEE;  Surgeon: Wylene Simmer, MD;  Location: [facility];  Service: Orthopedics;  Laterality: Left;  with Debridement of Lateral Meniscus tear  . Rotator cuff repair Bilateral   . Ankle fusion  08/27/2012    Procedure: ARTHRODESIS ANKLE;  Surgeon: Wylene Simmer, MD;  Location: [facility];  Service: Orthopedics;  Laterality: Right;  Arthrodesis right ankle and subtalar joint  . Foot arthrodesis, subtalar Right 2013    Family History  Problem Relation Age of Onset  . Prostate cancer Brother   . Heart disease Mother   . Emphysema Sister   . Asthma Mother    Social History:  reports  that she quit smoking about 24 years ago. Her smoking use included Cigarettes. She has a 15 pack-year smoking history. She has never used smokeless tobacco. She reports that she does not drink alcohol or use illicit drugs.  Allergies:  Allergies  Allergen Reactions  . Aspirin Other (See Comments)    REACTION: regular strength causes "heart to beat fast"  . Captopril Hypertension  . Naproxen Other (See Comments)    Tongue swelling  . Penicillins     Swelling around site  . Sulfonamide Derivatives Hives  . Clindamycin/Lincomycin     No prescriptions prior to admission    No results found for this or any previous visit (from the past 48 hour(s)). No results found.  Review of Systems  Constitutional: Negative.   HENT: Negative.   Eyes: Negative.   Respiratory: Negative.   Cardiovascular: Negative.   Gastrointestinal: Negative.   Musculoskeletal: Negative.   Skin: Negative.   Neurological: Negative for tremors.  Endo/Heme/Allergies: Negative.   Psychiatric/Behavioral: The patient is not nervous/anxious.     There were no vitals taken for this visit. Physical Exam  78 y/o WD WN, in NAD, A/Ox3, appears stated age. EOMI, mood and affect normal, respirations unlabored. The pt's gait is antalgic to the right. 5/5  strength with PF and DF of the toes. DP pulses 2+ b/l. Normal sensation to light touch intact. No lymphadenopathy. No signs of infection noted.  Assessment/Plan Pt reports to OR for surgical removal of painful hardware and revision of subtalar arthrodesis.   Judeth Cornfield Flowers 03/30/2014, 10:43 AM   Agree with note above.  TO OR for removal of hardware and revision subtalar arthrodesis.  The risks and benefits of the alternative treatment options have been discussed in detail.  The patient wishes to proceed with surgery and specifically understands risks of bleeding, infection, nerve damage, blood clots, need for additional surgery, amputation and death.

## 2014-03-31 ENCOUNTER — Encounter (HOSPITAL_COMMUNITY): Payer: Self-pay | Admitting: Surgery

## 2014-03-31 ENCOUNTER — Inpatient Hospital Stay (HOSPITAL_COMMUNITY): Payer: PRIVATE HEALTH INSURANCE | Admitting: Certified Registered"

## 2014-03-31 ENCOUNTER — Inpatient Hospital Stay (HOSPITAL_COMMUNITY): Payer: PRIVATE HEALTH INSURANCE

## 2014-03-31 ENCOUNTER — Encounter (HOSPITAL_COMMUNITY): Payer: PRIVATE HEALTH INSURANCE | Admitting: Vascular Surgery

## 2014-03-31 ENCOUNTER — Encounter (HOSPITAL_COMMUNITY)
Admission: RE | Disposition: A | Discharge status: Skilled Nursing Facility | Payer: Self-pay | Source: Ambulatory Visit | Attending: Orthopedic Surgery

## 2014-03-31 ENCOUNTER — Inpatient Hospital Stay (HOSPITAL_COMMUNITY)
Admission: RE | Admit: 2014-03-31 | Discharge: 2014-04-04 | DRG: 496 | Disposition: A | Discharge status: Skilled Nursing Facility | Payer: PRIVATE HEALTH INSURANCE | Source: Ambulatory Visit | Attending: Orthopedic Surgery | Admitting: Orthopedic Surgery

## 2014-03-31 DIAGNOSIS — F3289 Other specified depressive episodes: Secondary | ICD-10-CM | POA: Diagnosis present

## 2014-03-31 DIAGNOSIS — F411 Generalized anxiety disorder: Secondary | ICD-10-CM | POA: Diagnosis present

## 2014-03-31 DIAGNOSIS — K219 Gastro-esophageal reflux disease without esophagitis: Secondary | ICD-10-CM

## 2014-03-31 DIAGNOSIS — I252 Old myocardial infarction: Secondary | ICD-10-CM | POA: Diagnosis not present

## 2014-03-31 DIAGNOSIS — I2510 Atherosclerotic heart disease of native coronary artery without angina pectoris: Secondary | ICD-10-CM | POA: Diagnosis present

## 2014-03-31 DIAGNOSIS — M19079 Primary osteoarthritis, unspecified ankle and foot: Secondary | ICD-10-CM | POA: Diagnosis present

## 2014-03-31 DIAGNOSIS — Y831 Surgical operation with implant of artificial internal device as the cause of abnormal reaction of the patient, or of later complication, without mention of misadventure at the time of the procedure: Secondary | ICD-10-CM | POA: Diagnosis present

## 2014-03-31 DIAGNOSIS — I10 Essential (primary) hypertension: Secondary | ICD-10-CM

## 2014-03-31 DIAGNOSIS — J441 Chronic obstructive pulmonary disease with (acute) exacerbation: Secondary | ICD-10-CM | POA: Diagnosis not present

## 2014-03-31 DIAGNOSIS — Z9861 Coronary angioplasty status: Secondary | ICD-10-CM | POA: Diagnosis not present

## 2014-03-31 DIAGNOSIS — F329 Major depressive disorder, single episode, unspecified: Secondary | ICD-10-CM | POA: Diagnosis present

## 2014-03-31 DIAGNOSIS — J449 Chronic obstructive pulmonary disease, unspecified: Secondary | ICD-10-CM | POA: Diagnosis present

## 2014-03-31 DIAGNOSIS — Z87891 Personal history of nicotine dependence: Secondary | ICD-10-CM | POA: Diagnosis not present

## 2014-03-31 DIAGNOSIS — Z79899 Other long term (current) drug therapy: Secondary | ICD-10-CM

## 2014-03-31 DIAGNOSIS — I4891 Unspecified atrial fibrillation: Secondary | ICD-10-CM | POA: Diagnosis present

## 2014-03-31 DIAGNOSIS — Z96659 Presence of unspecified artificial knee joint: Secondary | ICD-10-CM | POA: Diagnosis not present

## 2014-03-31 DIAGNOSIS — I480 Paroxysmal atrial fibrillation: Secondary | ICD-10-CM | POA: Diagnosis present

## 2014-03-31 DIAGNOSIS — M79609 Pain in unspecified limb: Secondary | ICD-10-CM | POA: Diagnosis present

## 2014-03-31 DIAGNOSIS — M960 Pseudarthrosis after fusion or arthrodesis: Secondary | ICD-10-CM | POA: Diagnosis present

## 2014-03-31 DIAGNOSIS — T8489XA Other specified complication of internal orthopedic prosthetic devices, implants and grafts, initial encounter: Secondary | ICD-10-CM | POA: Diagnosis present

## 2014-03-31 DIAGNOSIS — E785 Hyperlipidemia, unspecified: Secondary | ICD-10-CM | POA: Diagnosis present

## 2014-03-31 DIAGNOSIS — J45909 Unspecified asthma, uncomplicated: Secondary | ICD-10-CM

## 2014-03-31 DIAGNOSIS — J45901 Unspecified asthma with (acute) exacerbation: Secondary | ICD-10-CM

## 2014-03-31 DIAGNOSIS — E039 Hypothyroidism, unspecified: Secondary | ICD-10-CM | POA: Diagnosis present

## 2014-03-31 HISTORY — PX: ARTHRODESIS TIBIOFIBULAR: SHX5188

## 2014-03-31 HISTORY — PX: HARDWARE REMOVAL: SHX979

## 2014-03-31 HISTORY — PX: REMOVAL OF IMPLANT: SHX6451

## 2014-03-31 HISTORY — PX: HARDWARE REVISION: SHX5845

## 2014-03-31 LAB — CBC
HCT: 35.6 % — ABNORMAL LOW (ref 36.0–46.0)
Hemoglobin: 12.6 g/dL (ref 12.0–15.0)
MCH: 33.1 pg (ref 26.0–34.0)
MCHC: 35.4 g/dL (ref 30.0–36.0)
MCV: 93.4 fL (ref 78.0–100.0)
Platelets: 249 10*3/uL (ref 150–400)
RBC: 3.81 MIL/uL — ABNORMAL LOW (ref 3.87–5.11)
RDW: 13 % (ref 11.5–15.5)
WBC: 9.4 10*3/uL (ref 4.0–10.5)

## 2014-03-31 LAB — APTT: aPTT: 36 seconds (ref 24–37)

## 2014-03-31 LAB — CREATININE, SERUM
Creatinine, Ser: 0.52 mg/dL (ref 0.50–1.10)
GFR calc Af Amer: >90 mL/min (ref >90)
GFR calc non Af Amer: >90 mL/min (ref >90)

## 2014-03-31 SURGERY — REMOVAL, HARDWARE
Anesthesia: Regional | Site: Leg Lower | Laterality: Right

## 2014-03-31 MED ORDER — HYDROMORPHONE HCL PF 1 MG/ML IJ SOLN: 0.5000 mg | INTRAMUSCULAR | Status: DC | PRN

## 2014-03-31 MED ORDER — DICYCLOMINE HCL 10 MG PO CAPS
10.0000 mg | ORAL_CAPSULE | Freq: Four times a day (QID) | ORAL | Status: DC | PRN
  Administered 2014-04-01 – 2014-04-03 (×2): 20 mg via ORAL
  Filled 2014-03-31 (×2): qty 2

## 2014-03-31 MED ORDER — ENOXAPARIN SODIUM 40 MG/0.4ML ~~LOC~~ SOLN: 40.0000 mg | SUBCUTANEOUS | Status: DC

## 2014-03-31 MED ORDER — LACTATED RINGERS IV SOLN
INTRAVENOUS | Status: DC
  Administered 2014-03-31: 12:00:00 via INTRAVENOUS

## 2014-03-31 MED ORDER — ALBUTEROL SULFATE HFA 108 (90 BASE) MCG/ACT IN AERS
INHALATION_SPRAY | RESPIRATORY_TRACT | Status: DC | PRN
  Administered 2014-03-31: 6 via RESPIRATORY_TRACT

## 2014-03-31 MED ORDER — ROSUVASTATIN CALCIUM 20 MG PO TABS
20.0000 mg | ORAL_TABLET | Freq: Every day | ORAL | Status: DC
  Administered 2014-03-31 – 2014-04-04 (×5): 20 mg via ORAL
  Filled 2014-03-31 (×5): qty 1

## 2014-03-31 MED ORDER — PROMETHAZINE HCL 25 MG/ML IJ SOLN: 6.2500 mg | INTRAMUSCULAR | Status: DC | PRN

## 2014-03-31 MED ORDER — ATORVASTATIN CALCIUM 20 MG PO TABS
20.0000 mg | ORAL_TABLET | Freq: Every day | ORAL | Status: DC
  Filled 2014-03-31: qty 1

## 2014-03-31 MED ORDER — CYCLOBENZAPRINE HCL 10 MG PO TABS
10.0000 mg | ORAL_TABLET | Freq: Three times a day (TID) | ORAL | Status: DC | PRN
  Administered 2014-03-31 – 2014-04-04 (×6): 10 mg via ORAL
  Filled 2014-03-31 (×7): qty 1

## 2014-03-31 MED ORDER — METOPROLOL TARTRATE 50 MG PO TABS
50.0000 mg | ORAL_TABLET | Freq: Two times a day (BID) | ORAL | Status: DC
  Administered 2014-03-31 – 2014-04-04 (×8): 50 mg via ORAL
  Filled 2014-03-31 (×9): qty 1

## 2014-03-31 MED ORDER — DEXAMETHASONE SODIUM PHOSPHATE 10 MG/ML IJ SOLN
INTRAMUSCULAR | Status: DC | PRN
Start: 2014-03-31 — End: 2014-03-31
  Administered 2014-03-31: 4 mg via INTRAVENOUS

## 2014-03-31 MED ORDER — MIDAZOLAM HCL 2 MG/2ML IJ SOLN
INTRAMUSCULAR | Status: AC
  Filled 2014-03-31: qty 2

## 2014-03-31 MED ORDER — LIDOCAINE HCL (CARDIAC) 20 MG/ML IV SOLN
INTRAVENOUS | Status: AC
  Filled 2014-03-31: qty 5

## 2014-03-31 MED ORDER — ACETAMINOPHEN 325 MG PO TABS: 325.0000 mg | ORAL_TABLET | ORAL | Status: DC | PRN

## 2014-03-31 MED ORDER — HYDROMORPHONE HCL PF 1 MG/ML IJ SOLN: 0.2500 mg | INTRAMUSCULAR | Status: DC | PRN

## 2014-03-31 MED ORDER — PROPOFOL 10 MG/ML IV BOLUS
INTRAVENOUS | Status: DC | PRN
  Administered 2014-03-31 (×2): 10 mg via INTRAVENOUS
  Administered 2014-03-31: 20 mg via INTRAVENOUS
  Administered 2014-03-31: 10 mg via INTRAVENOUS
  Administered 2014-03-31: 170 mg via INTRAVENOUS
  Administered 2014-03-31 (×2): 10 mg via INTRAVENOUS

## 2014-03-31 MED ORDER — OMEGA-3-ACID ETHYL ESTERS 1 G PO CAPS
2.0000 g | ORAL_CAPSULE | Freq: Two times a day (BID) | ORAL | Status: DC
  Administered 2014-03-31 – 2014-04-04 (×8): 2 g via ORAL
  Filled 2014-03-31 (×9): qty 2

## 2014-03-31 MED ORDER — NITROGLYCERIN 0.4 MG SL SUBL: 0.4000 mg | SUBLINGUAL_TABLET | SUBLINGUAL | Status: DC | PRN

## 2014-03-31 MED ORDER — PROPOFOL 10 MG/ML IV BOLUS
INTRAVENOUS | Status: AC
  Filled 2014-03-31: qty 20

## 2014-03-31 MED ORDER — MIDAZOLAM HCL 5 MG/5ML IJ SOLN
INTRAMUSCULAR | Status: DC | PRN
  Administered 2014-03-31 (×2): 1 mg via INTRAVENOUS

## 2014-03-31 MED ORDER — DM-GUAIFENESIN ER 30-600 MG PO TB12
1.0000 | ORAL_TABLET | Freq: Two times a day (BID) | ORAL | Status: DC
  Administered 2014-03-31 – 2014-04-04 (×8): 1 via ORAL
  Filled 2014-03-31 (×10): qty 1

## 2014-03-31 MED ORDER — ISOSORBIDE MONONITRATE ER 60 MG PO TB24
60.0000 mg | ORAL_TABLET | Freq: Every day | ORAL | Status: DC
  Administered 2014-03-31 – 2014-04-03 (×4): 60 mg via ORAL
  Filled 2014-03-31 (×5): qty 1

## 2014-03-31 MED ORDER — ACETAMINOPHEN 160 MG/5ML PO SOLN
325.0000 mg | ORAL | Status: DC | PRN
  Filled 2014-03-31: qty 20.3

## 2014-03-31 MED ORDER — CALCIUM CARBONATE-VITAMIN D 600-400 MG-UNIT PO TABS: 1.0000 | ORAL_TABLET | Freq: Every day | ORAL | Status: DC

## 2014-03-31 MED ORDER — CALCIUM CARBONATE-VITAMIN D 500-200 MG-UNIT PO TABS
1.0000 | ORAL_TABLET | Freq: Every day | ORAL | Status: DC
  Administered 2014-03-31 – 2014-04-04 (×5): 1 via ORAL
  Filled 2014-03-31 (×6): qty 1

## 2014-03-31 MED ORDER — RIVAROXABAN 20 MG PO TABS
20.0000 mg | ORAL_TABLET | Freq: Every day | ORAL | Status: DC
  Administered 2014-04-01 – 2014-04-04 (×4): 20 mg via ORAL
  Filled 2014-03-31 (×5): qty 1

## 2014-03-31 MED ORDER — ONDANSETRON HCL 4 MG/2ML IJ SOLN
4.0000 mg | Freq: Four times a day (QID) | INTRAMUSCULAR | Status: DC | PRN
  Administered 2014-04-02: 4 mg via INTRAVENOUS
  Filled 2014-03-31: qty 2

## 2014-03-31 MED ORDER — SERTRALINE HCL 100 MG PO TABS
100.0000 mg | ORAL_TABLET | Freq: Every day | ORAL | Status: DC
  Administered 2014-03-31 – 2014-04-03 (×4): 100 mg via ORAL
  Filled 2014-03-31 (×6): qty 1

## 2014-03-31 MED ORDER — LACTATED RINGERS IV SOLN
INTRAVENOUS | Status: DC | PRN
  Administered 2014-03-31 (×2): via INTRAVENOUS

## 2014-03-31 MED ORDER — ACETAMINOPHEN 325 MG PO TABS: 650.0000 mg | ORAL_TABLET | Freq: Four times a day (QID) | ORAL | Status: DC | PRN

## 2014-03-31 MED ORDER — ALPRAZOLAM 0.5 MG PO TABS
1.0000 mg | ORAL_TABLET | Freq: Every evening | ORAL | Status: DC | PRN
  Administered 2014-03-31 – 2014-04-03 (×3): 1 mg via ORAL
  Filled 2014-03-31 (×3): qty 2

## 2014-03-31 MED ORDER — VITAMIN C 500 MG PO TABS
1000.0000 mg | ORAL_TABLET | Freq: Two times a day (BID) | ORAL | Status: DC
  Administered 2014-03-31 – 2014-04-04 (×8): 1000 mg via ORAL
  Filled 2014-03-31 (×9): qty 2

## 2014-03-31 MED ORDER — ADULT MULTIVITAMIN W/MINERALS CH
1.0000 | ORAL_TABLET | Freq: Every day | ORAL | Status: DC
  Administered 2014-03-31 – 2014-04-04 (×5): 1 via ORAL
  Filled 2014-03-31 (×5): qty 1

## 2014-03-31 MED ORDER — FLUTICASONE PROPIONATE 50 MCG/ACT NA SUSP
2.0000 | Freq: Every day | NASAL | Status: DC
  Administered 2014-03-31 – 2014-04-04 (×5): 2 via NASAL
  Filled 2014-03-31: qty 16

## 2014-03-31 MED ORDER — ONDANSETRON HCL 4 MG PO TABS: 4.0000 mg | ORAL_TABLET | Freq: Four times a day (QID) | ORAL | Status: DC | PRN

## 2014-03-31 MED ORDER — ROSUVASTATIN CALCIUM 20 MG PO TABS: 20.0000 mg | ORAL_TABLET | Freq: Every day | ORAL | Status: DC

## 2014-03-31 MED ORDER — ALBUTEROL SULFATE (2.5 MG/3ML) 0.083% IN NEBU
2.5000 mg | INHALATION_SOLUTION | Freq: Four times a day (QID) | RESPIRATORY_TRACT | Status: DC | PRN
  Administered 2014-04-01: 2.5 mg via RESPIRATORY_TRACT
  Filled 2014-03-31: qty 3

## 2014-03-31 MED ORDER — LIDOCAINE HCL (CARDIAC) 20 MG/ML IV SOLN
INTRAVENOUS | Status: DC | PRN
  Administered 2014-03-31: 60 mg via INTRAVENOUS

## 2014-03-31 MED ORDER — GLYCOPYRROLATE 0.2 MG/ML IJ SOLN
INTRAMUSCULAR | Status: AC
  Filled 2014-03-31: qty 5

## 2014-03-31 MED ORDER — ONDANSETRON HCL 4 MG/2ML IJ SOLN
INTRAMUSCULAR | Status: DC | PRN
  Administered 2014-03-31: 4 mg via INTRAVENOUS

## 2014-03-31 MED ORDER — ALBUTEROL SULFATE HFA 108 (90 BASE) MCG/ACT IN AERS: 2.0000 | INHALATION_SPRAY | Freq: Four times a day (QID) | RESPIRATORY_TRACT | Status: DC | PRN

## 2014-03-31 MED ORDER — FENTANYL CITRATE 0.05 MG/ML IJ SOLN
INTRAMUSCULAR | Status: DC | PRN
  Administered 2014-03-31: 50 ug via INTRAVENOUS
  Administered 2014-03-31: 25 ug via INTRAVENOUS
  Administered 2014-03-31: 100 ug via INTRAVENOUS
  Administered 2014-03-31: 25 ug via INTRAVENOUS

## 2014-03-31 MED ORDER — SODIUM CHLORIDE 0.9 % IR SOLN
Status: DC | PRN
  Administered 2014-03-31: 15:00:00

## 2014-03-31 MED ORDER — BUPIVACAINE-EPINEPHRINE PF 0.25-1:200000 % IJ SOLN
INTRAMUSCULAR | Status: DC | PRN
  Administered 2014-03-31: 10 mL via PERINEURAL

## 2014-03-31 MED ORDER — OXYCODONE HCL 5 MG/5ML PO SOLN: 5.0000 mg | Freq: Once | ORAL | Status: DC | PRN

## 2014-03-31 MED ORDER — PANTOPRAZOLE SODIUM 40 MG PO TBEC
40.0000 mg | DELAYED_RELEASE_TABLET | Freq: Every day | ORAL | Status: DC
  Administered 2014-04-01: 40 mg via ORAL
  Filled 2014-03-31: qty 1

## 2014-03-31 MED ORDER — MONTELUKAST SODIUM 10 MG PO TABS
10.0000 mg | ORAL_TABLET | Freq: Every day | ORAL | Status: DC
  Administered 2014-03-31 – 2014-04-03 (×4): 10 mg via ORAL
  Filled 2014-03-31 (×5): qty 1

## 2014-03-31 MED ORDER — LEVOTHYROXINE SODIUM 50 MCG PO TABS
50.0000 ug | ORAL_TABLET | Freq: Every day | ORAL | Status: DC
  Administered 2014-04-01 – 2014-04-04 (×4): 50 ug via ORAL
  Filled 2014-03-31 (×5): qty 1

## 2014-03-31 MED ORDER — VITAMIN D3 25 MCG (1000 UNIT) PO TABS
1000.0000 [IU] | ORAL_TABLET | Freq: Two times a day (BID) | ORAL | Status: DC
  Administered 2014-04-01 – 2014-04-04 (×6): 1000 [IU] via ORAL
  Filled 2014-03-31 (×9): qty 1

## 2014-03-31 MED ORDER — GLYCOPYRROLATE 0.2 MG/ML IJ SOLN
INTRAMUSCULAR | Status: DC | PRN
  Administered 2014-03-31: 0.2 mg via INTRAVENOUS
  Administered 2014-03-31 (×2): 0.4 mg via INTRAVENOUS

## 2014-03-31 MED ORDER — ROPIVACAINE HCL 5 MG/ML IJ SOLN
INTRAMUSCULAR | Status: DC | PRN
  Administered 2014-03-31: 30 mL via PERINEURAL

## 2014-03-31 MED ORDER — ROCURONIUM BROMIDE 50 MG/5ML IV SOLN
INTRAVENOUS | Status: AC
  Filled 2014-03-31: qty 1

## 2014-03-31 MED ORDER — NEOSTIGMINE METHYLSULFATE 1 MG/ML IJ SOLN
INTRAMUSCULAR | Status: DC | PRN
  Administered 2014-03-31 (×2): 2 mg via INTRAVENOUS
  Administered 2014-03-31: 1 mg via INTRAVENOUS

## 2014-03-31 MED ORDER — ROCURONIUM BROMIDE 100 MG/10ML IV SOLN
INTRAVENOUS | Status: DC | PRN
  Administered 2014-03-31: 40 mg via INTRAVENOUS

## 2014-03-31 MED ORDER — FENTANYL CITRATE 0.05 MG/ML IJ SOLN
INTRAMUSCULAR | Status: AC
  Filled 2014-03-31: qty 5

## 2014-03-31 MED ORDER — DEXAMETHASONE SODIUM PHOSPHATE 4 MG/ML IJ SOLN
INTRAMUSCULAR | Status: AC
  Filled 2014-03-31: qty 1

## 2014-03-31 MED ORDER — LORATADINE 10 MG PO TABS
10.0000 mg | ORAL_TABLET | Freq: Two times a day (BID) | ORAL | Status: DC
  Administered 2014-04-01 – 2014-04-04 (×7): 10 mg via ORAL
  Filled 2014-03-31 (×10): qty 1

## 2014-03-31 MED ORDER — OXYCODONE HCL 5 MG PO TABS: 5.0000 mg | ORAL_TABLET | Freq: Once | ORAL | Status: DC | PRN

## 2014-03-31 MED ORDER — OXYCODONE HCL 5 MG PO TABS
5.0000 mg | ORAL_TABLET | ORAL | Status: DC | PRN
  Administered 2014-03-31 – 2014-04-04 (×15): 10 mg via ORAL
  Filled 2014-03-31 (×15): qty 2

## 2014-03-31 MED ORDER — PHENYLEPHRINE HCL 10 MG/ML IJ SOLN
INTRAMUSCULAR | Status: DC | PRN
  Administered 2014-03-31 (×5): 40 ug via INTRAVENOUS

## 2014-03-31 MED ORDER — ONDANSETRON HCL 4 MG PO TABS: 4.0000 mg | ORAL_TABLET | Freq: Three times a day (TID) | ORAL | Status: DC | PRN

## 2014-03-31 MED ORDER — 0.9 % SODIUM CHLORIDE (POUR BTL) OPTIME
TOPICAL | Status: DC | PRN
  Administered 2014-03-31: 1000 mL

## 2014-03-31 MED ORDER — BACITRACIN ZINC 500 UNIT/GM EX OINT
TOPICAL_OINTMENT | CUTANEOUS | Status: DC | PRN
  Administered 2014-03-31: 1 via TOPICAL

## 2014-03-31 MED ORDER — BACITRACIN ZINC 500 UNIT/GM EX OINT
TOPICAL_OINTMENT | CUTANEOUS | Status: AC
  Filled 2014-03-31: qty 15

## 2014-03-31 MED ORDER — ONDANSETRON HCL 4 MG/2ML IJ SOLN
INTRAMUSCULAR | Status: AC
  Filled 2014-03-31: qty 2

## 2014-03-31 SURGICAL SUPPLY — 77 items
BAG DECANTER FOR FLEXI CONT (MISCELLANEOUS) ×2 IMPLANT
BANDAGE ESMARK 6X9 LF (GAUZE/BANDAGES/DRESSINGS) ×2 IMPLANT
BLADE SAW SGTL HD 18.5X60.5X1. (BLADE) ×2 IMPLANT
BLADE SURG 10 STRL SS (BLADE) ×4 IMPLANT
BNDG CMPR 9X6 STRL LF SNTH (GAUZE/BANDAGES/DRESSINGS) ×2
BNDG COHESIVE 4X5 TAN STRL (GAUZE/BANDAGES/DRESSINGS) ×4 IMPLANT
BNDG COHESIVE 6X5 TAN STRL LF (GAUZE/BANDAGES/DRESSINGS) ×4 IMPLANT
BNDG ESMARK 6X9 LF (GAUZE/BANDAGES/DRESSINGS) ×4
CANISTER SUCT 3000ML (MISCELLANEOUS) ×4 IMPLANT
CHLORAPREP W/TINT 10.5 ML (MISCELLANEOUS) ×2 IMPLANT
CHLORAPREP W/TINT 26ML (MISCELLANEOUS) ×4 IMPLANT
CLOSURE WOUND 1/2 X4 (GAUZE/BANDAGES/DRESSINGS)
COVER SURGICAL LIGHT HANDLE (MISCELLANEOUS) ×4 IMPLANT
CUFF TOURNIQUET SINGLE 34IN LL (TOURNIQUET CUFF) ×4 IMPLANT
CUFF TOURNIQUET SINGLE 44IN (TOURNIQUET CUFF) IMPLANT
DRAPE C-ARM 42X72 X-RAY (DRAPES) ×4 IMPLANT
DRAPE C-ARMOR (DRAPES) ×4 IMPLANT
DRAPE INCISE IOBAN 66X45 STRL (DRAPES) ×4 IMPLANT
DRAPE U-SHAPE 47X51 STRL (DRAPES) ×4 IMPLANT
DRSG ADAPTIC 3X8 NADH LF (GAUZE/BANDAGES/DRESSINGS) ×2 IMPLANT
DRSG PAD ABDOMINAL 8X10 ST (GAUZE/BANDAGES/DRESSINGS) ×4 IMPLANT
DRSG TEGADERM 4X4.75 (GAUZE/BANDAGES/DRESSINGS) ×2 IMPLANT
ELECT REM PT RETURN 9FT ADLT (ELECTROSURGICAL) ×4
ELECTRODE REM PT RTRN 9FT ADLT (ELECTROSURGICAL) ×2 IMPLANT
GAUZE SPONGE 2X2 8PLY STRL LF (GAUZE/BANDAGES/DRESSINGS) ×2 IMPLANT
GLOVE BIO SURGEON STRL SZ7 (GLOVE) ×4 IMPLANT
GLOVE BIO SURGEON STRL SZ8 (GLOVE) ×4 IMPLANT
GLOVE BIOGEL PI IND STRL 6.5 (GLOVE) IMPLANT
GLOVE BIOGEL PI IND STRL 7.5 (GLOVE) ×2 IMPLANT
GLOVE BIOGEL PI IND STRL 8 (GLOVE) ×2 IMPLANT
GLOVE BIOGEL PI INDICATOR 6.5 (GLOVE) ×2
GLOVE BIOGEL PI INDICATOR 7.5 (GLOVE) ×2
GLOVE BIOGEL PI INDICATOR 8 (GLOVE) ×2
GLOVE SURG SS PI 6.0 STRL IVOR (GLOVE) ×2 IMPLANT
GOWN STRL REUS W/ TWL LRG LVL3 (GOWN DISPOSABLE) ×4 IMPLANT
GOWN STRL REUS W/ TWL XL LVL3 (GOWN DISPOSABLE) ×2 IMPLANT
GOWN STRL REUS W/TWL LRG LVL3 (GOWN DISPOSABLE) ×8
GOWN STRL REUS W/TWL XL LVL3 (GOWN DISPOSABLE) ×12
KIT BASIN OR (CUSTOM PROCEDURE TRAY) ×4 IMPLANT
KIT INFUSE X SMALL 1.4CC (Orthopedic Implant) ×2 IMPLANT
KIT ROOM TURNOVER OR (KITS) ×4 IMPLANT
NEEDLE 22X1 1/2 (OR ONLY) (NEEDLE) ×2 IMPLANT
NS IRRIG 1000ML POUR BTL (IV SOLUTION) ×4 IMPLANT
PACK ORTHO EXTREMITY (CUSTOM PROCEDURE TRAY) ×4 IMPLANT
PAD ARMBOARD 7.5X6 YLW CONV (MISCELLANEOUS) ×8 IMPLANT
PAD CAST 4YDX4 CTTN HI CHSV (CAST SUPPLIES) ×2 IMPLANT
PADDING CAST COTTON 4X4 STRL (CAST SUPPLIES) ×4
PADDING CAST COTTON 6X4 STRL (CAST SUPPLIES) ×2 IMPLANT
PIN GUIDE DRILL TIP 2.8X300 (DRILL) ×2 IMPLANT
PUTTY DBM STAGRAFT PLUS 5CC (Putty) ×2 IMPLANT
SCREW CANN 6.5X85X40MM THRD (Screw) ×2 IMPLANT
SCREW CANN 80X40X6.5 NS (Screw) IMPLANT
SCREW CANNULATED 6.5X50MM (Screw) ×2 IMPLANT
SCREW CANNULATED 6.5X80MM (Screw) ×4 IMPLANT
SOAP 2 % CHG 4 OZ (WOUND CARE) ×4 IMPLANT
SPONGE GAUZE 2X2 STER 10/PKG (GAUZE/BANDAGES/DRESSINGS)
SPONGE GAUZE 4X4 12PLY (GAUZE/BANDAGES/DRESSINGS) ×2 IMPLANT
SPONGE LAP 18X18 X RAY DECT (DISPOSABLE) ×4 IMPLANT
STAPLER VISISTAT 35W (STAPLE) IMPLANT
STRIP CLOSURE SKIN 1/2X4 (GAUZE/BANDAGES/DRESSINGS) ×2 IMPLANT
SUCTION FRAZIER TIP 10 FR DISP (SUCTIONS) ×4 IMPLANT
SUT ETHILON 3 0 PS 1 (SUTURE) ×6 IMPLANT
SUT MNCRL AB 3-0 PS2 18 (SUTURE) ×2 IMPLANT
SUT PROLENE 3 0 PS 2 (SUTURE) ×2 IMPLANT
SUT VIC AB 0 CT1 27 (SUTURE)
SUT VIC AB 0 CT1 27XBRD ANBCTR (SUTURE) ×2 IMPLANT
SUT VIC AB 2-0 CT1 27 (SUTURE)
SUT VIC AB 2-0 CT1 TAPERPNT 27 (SUTURE) ×4 IMPLANT
SUT VIC AB 3-0 PS2 18 (SUTURE)
SUT VIC AB 3-0 PS2 18XBRD (SUTURE) ×2 IMPLANT
SYR CONTROL 10ML LL (SYRINGE) ×2 IMPLANT
TOWEL OR 17X24 6PK STRL BLUE (TOWEL DISPOSABLE) ×4 IMPLANT
TOWEL OR 17X26 10 PK STRL BLUE (TOWEL DISPOSABLE) ×4 IMPLANT
TUBE CONNECTING 12'X1/4 (SUCTIONS) ×1
TUBE CONNECTING 12X1/4 (SUCTIONS) ×3 IMPLANT
WASHER FLAT 6.5MM (Washer) ×2 IMPLANT
WATER STERILE IRR 1000ML POUR (IV SOLUTION) ×2 IMPLANT

## 2014-03-31 NOTE — Brief Op Note (Signed)
03/31/2014  4:19 PM  PATIENT:  [name]  78 y.o. 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  Removal of deep implants from the right tibia 2.  Removal of deep implants from the right talus (separate incision) 3.  Removal of deep implants from the right calcaneus (separate incision) 4.  Revision right subtalar arthrodesis   SURGEON:  Wylene Simmer, MD  ASSISTANT: n/a  ANESTHESIA:   General, regional  EBL:  minimal   TOURNIQUET:   Total Tourniquet Time Documented: Thigh (Right) - 91 minutes Total: Thigh (Right) - 91 minutes  COMPLICATIONS:  None apparent  DISPOSITION:  Extubated, awake and stable to recovery.  DICTATION ID:  [id number]

## 2014-03-31 NOTE — Progress Notes (Signed)
Pt has some redness, petechiae-like ares on rue. BP cuff moved to lue. Dr Ermalene Postin here and aware.

## 2014-03-31 NOTE — Anesthesia Procedure Notes (Addendum)
Anesthesia Regional Block:  Popliteal block  Pre-Anesthetic Checklist: ,, timeout performed, Correct Patient, Correct Site, Correct Laterality, Correct Procedure, Correct Position, site marked, Risks and benefits discussed,  Surgical consent,  Pre-op evaluation,  At surgeon's request and post-op pain management  Laterality: Lower and Right  Prep: chloraprep       Needles:  Injection technique: Single-shot  Needle Type: Echogenic Stimulator Needle          Additional Needles:  Procedures: ultrasound guided (picture in chart) and nerve stimulator Popliteal block  Nerve Stimulator or Paresthesia:  Response: plantarflexion, 0.4 mA,   Additional Responses:   Narrative:  Start time: 03/31/2014 1:11 PM End time: 03/31/2014 1:27 PM Injection made incrementally with aspirations every 5 mL.  Performed by: Personally  Anesthesiologist: Moser  Additional Notes: Saphenous nerve block was performed as well with US guidance   H+P and labs reviewed, risks and benefits discussed with patient, procedure tolerated well without complications   Procedure Name: Intubation Date/Time: 03/31/2014 2:22 PM Performed by: Raphael Gibney T Pre-anesthesia Checklist: Patient identified, Timeout performed, Emergency Drugs available, Suction available and Patient being monitored Patient Re-evaluated:Patient Re-evaluated prior to inductionOxygen Delivery Method: Circle system utilized and Simple face mask Preoxygenation: Pre-oxygenation with 100% oxygen Intubation Type: IV induction Ventilation: Mask ventilation without difficulty Laryngoscope Size: Miller and 2 Grade View: Grade I Tube type: Oral Tube size: 7.0 mm Number of attempts: 1 Airway Equipment and Method: Patient positioned with wedge pillow and Stylet Placement Confirmation: ETT inserted through vocal cords under direct vision,  breath sounds checked- equal and bilateral and positive ETCO2 Secured at: 22 cm Tube secured with:  Tape Dental Injury: Teeth and Oropharynx as per pre-operative assessment

## 2014-03-31 NOTE — Anesthesia Preprocedure Evaluation (Signed)
Anesthesia Evaluation  Patient identified by MRN, date of birth, ID band Patient awake    Reviewed: Allergy & Precautions, H&P , NPO status , Patient's Chart, lab work & pertinent test results  History of Anesthesia Complications (+) PONV and history of anesthetic complications  Airway Mallampati: II TM Distance: >3 FB Neck ROM: Full    Dental  (+) Partial Upper   Pulmonary neg shortness of breath, neg sleep apnea, COPD COPD inhaler, former smoker, neg PE breath sounds clear to auscultation        Cardiovascular hypertension, Pt. on medications and Pt. on home beta blockers + CAD and + Past MI - Cardiac Stents and - CHF + dysrhythmias Atrial Fibrillation - Valvular Problems/MurmursRhythm:Irregular     Neuro/Psych PSYCHIATRIC DISORDERS Depression H/o meningitis/encephalitis after spinal    GI/Hepatic Neg liver ROS, hiatal hernia, GERD-  Medicated,  Endo/Other  Hypothyroidism   Renal/GU negative Renal ROS     Musculoskeletal  (+) Fibromyalgia -  Abdominal   Peds  Hematology negative hematology ROS (+)   Anesthesia Other Findings   Reproductive/Obstetrics                           Anesthesia Physical Anesthesia Plan  ASA: III  Anesthesia Plan: General and Regional   Post-op Pain Management:    Induction: Intravenous  Airway Management Planned: Oral ETT  Additional Equipment: None  Intra-op Plan:   Post-operative Plan: Extubation in OR  Informed Consent: I have reviewed the patients History and Physical, chart, labs and discussed the procedure including the risks, benefits and alternatives for the proposed anesthesia with the patient or authorized representative who has indicated his/her understanding and acceptance.   Dental advisory given  Plan Discussed with: CRNA and Surgeon  Anesthesia Plan Comments:         Anesthesia Quick Evaluation

## 2014-03-31 NOTE — Anesthesia Postprocedure Evaluation (Signed)
  Anesthesia Post-op Note  Patient: [name]  Procedure(s) Performed: Procedure(s): REMOVAL OF DEEP IMPLANTS X 3  RIGHT  (Right) REVISION OF SUBTALOR ARTHRODESIS  RIGHT  (Right)  Patient Location: PACU  Anesthesia Type:GA combined with regional for post-op pain  Level of Consciousness: awake and alert   Airway and Oxygen Therapy: Patient Spontanous Breathing and Patient connected to nasal cannula oxygen  Post-op Pain: none  Post-op Assessment: Post-op Vital signs reviewed, Patient's Cardiovascular Status Stable, Respiratory Function Stable, Patent Airway, No signs of Nausea or vomiting and Pain level controlled  Post-op Vital Signs: Reviewed and stable  Last Vitals:  Filed Vitals:   03/31/14 1643  BP:   Pulse: 77  Temp:   Resp: 14    Complications: No apparent anesthesia complications

## 2014-03-31 NOTE — Transfer of Care (Signed)
Immediate Anesthesia Transfer of Care Note  Patient: [name]  Procedure(s) Performed: Procedure(s): REMOVAL OF DEEP IMPLANTS X 3  RIGHT  (Right) REVISION OF SUBTALOR ARTHRODESIS  RIGHT  (Right)  Patient Location: PACU  Anesthesia Type:General and GA combined with regional for post-op pain  Level of Consciousness: awake, alert  and oriented  Airway & Oxygen Therapy: Patient Spontanous Breathing and Patient connected to nasal cannula oxygen  Post-op Assessment: Report given to PACU RN and Post -op Vital signs reviewed and stable  Post vital signs: Reviewed and stable  Complications: No apparent anesthesia complications

## 2014-04-01 ENCOUNTER — Encounter (HOSPITAL_COMMUNITY): Payer: Self-pay | Admitting: General Practice

## 2014-04-01 ENCOUNTER — Inpatient Hospital Stay (HOSPITAL_COMMUNITY): Payer: PRIVATE HEALTH INSURANCE

## 2014-04-01 DIAGNOSIS — K219 Gastro-esophageal reflux disease without esophagitis: Secondary | ICD-10-CM

## 2014-04-01 DIAGNOSIS — I4891 Unspecified atrial fibrillation: Secondary | ICD-10-CM

## 2014-04-01 DIAGNOSIS — J45909 Unspecified asthma, uncomplicated: Secondary | ICD-10-CM

## 2014-04-01 DIAGNOSIS — I10 Essential (primary) hypertension: Secondary | ICD-10-CM

## 2014-04-01 MED ORDER — PANTOPRAZOLE SODIUM 40 MG PO TBEC: 40.0000 mg | DELAYED_RELEASE_TABLET | Freq: Two times a day (BID) | ORAL | Status: DC

## 2014-04-01 MED ORDER — PANTOPRAZOLE SODIUM 40 MG PO TBEC
40.0000 mg | DELAYED_RELEASE_TABLET | Freq: Two times a day (BID) | ORAL | Status: DC
  Administered 2014-04-01 – 2014-04-04 (×7): 40 mg via ORAL
  Filled 2014-04-01 (×7): qty 1

## 2014-04-01 MED ORDER — PREDNISONE 20 MG PO TABS
40.0000 mg | ORAL_TABLET | Freq: Every day | ORAL | Status: DC
  Administered 2014-04-03: 40 mg via ORAL
  Filled 2014-04-01 (×3): qty 2

## 2014-04-01 MED ORDER — BENZONATATE 100 MG PO CAPS
200.0000 mg | ORAL_CAPSULE | Freq: Three times a day (TID) | ORAL | Status: DC
  Administered 2014-04-01 – 2014-04-04 (×9): 200 mg via ORAL
  Filled 2014-04-01 (×10): qty 2

## 2014-04-01 MED ORDER — ALBUTEROL SULFATE (2.5 MG/3ML) 0.083% IN NEBU
2.5000 mg | INHALATION_SOLUTION | Freq: Four times a day (QID) | RESPIRATORY_TRACT | Status: DC
  Administered 2014-04-01 – 2014-04-02 (×2): 2.5 mg via RESPIRATORY_TRACT
  Filled 2014-04-01 (×2): qty 3

## 2014-04-01 MED ORDER — ALBUTEROL SULFATE (2.5 MG/3ML) 0.083% IN NEBU: 2.5000 mg | INHALATION_SOLUTION | RESPIRATORY_TRACT | Status: DC | PRN

## 2014-04-01 MED ORDER — PREDNISONE 20 MG PO TABS
40.0000 mg | ORAL_TABLET | Freq: Every day | ORAL | Status: DC
  Filled 2014-04-01: qty 2

## 2014-04-01 NOTE — Evaluation (Signed)
Physical Therapy Evaluation Patient Details Name: [name] MRN: [id number] DOB: Oct 18, 1936 Today's Date: 04/01/2014   History of Present Illness  78 y.o. female s/p REMOVAL OF DEEP IMPLANTS X 3  and REVISION OF SUBTALOR ARTHRODESIS on RIGHT  Clinical Impression  Patient seen for surgery listed above, resulting in functional limitations due to the deficits listed below (see PT Problem List). She is limited in ambulation due to shoulder pain from previous shoulder injuries, preventing her from using upper extremities with rolling walker to ambulate further distances. Patient will benefit from skilled PT to increase their independence and safety with mobility to allow discharge to the venue listed below. Pt would benefit from renting w/c to mobilize further distances until she is cleared for weight bearing on RLE.     Follow Up Recommendations SNF;Supervision/Assistance - 24 hour    Equipment Recommendations  Wheelchair (measurements PT);Wheelchair cushion (measurements PT)    Recommendations for Other Services       Precautions / Restrictions Precautions Precautions: None Restrictions Weight Bearing Restrictions: Yes RLE Weight Bearing: Non weight bearing      Mobility  Bed Mobility Overal bed mobility: Modified Independent             General bed mobility comments: supine>sit at EOB without assist from PT. HOB was elevated.  Transfers Overall transfer level: Needs assistance Equipment used: Rolling walker (2 wheeled) Transfers: Sit to/from Stand Sit to Stand: Min assist;+2 safety/equipment         General transfer comment: Min assist +2 for safety from lowest bed setting. verbal cues for hand placement  Ambulation/Gait Ambulation/Gait assistance: Min assist;+2 safety/equipment Ambulation Distance (Feet): 4 Feet Assistive device: Rolling walker (2 wheeled) Gait Pattern/deviations:  ("hop-to" gait pattern with RW)     General Gait Details: Pt able to hop  with RW up to 4 feet. Verbal cues given for sequencing and demonstrated how to safely use RW to hop. Pt with difficulty pushing through arms due to pt voicing past surgeries on shoulder. This limits her ability to use UE to unload LLE and advance foot. Pt took many small hops vs one adequate step. She was able maintain NWB status however fatigues quickly in LLE and UEs  Stairs            Wheelchair Mobility    Modified Rankin (Stroke Patients Only)       Balance Overall balance assessment: Needs assistance Sitting-balance support: No upper extremity supported;Feet unsupported Sitting balance-Leahy Scale: Good Sitting balance - Comments: Sits EOB without assist   Standing balance support: Bilateral upper extremity supported Standing balance-Leahy Scale: Poor Standing balance comment: Requires UE support on RW                             Pertinent Vitals/Pain Pt does not give numerical value for pain. States ankle is beginning to "burn a little" Pt repositioned in chair for comfort. RLE elevated    Home Living Family/patient expects to be discharged to:: Skilled nursing facility Living Arrangements: Alone Available Help at Discharge: [place] [facility] Type of Home: Apartment Home Access: Level entry     Home Layout: One level Home Equipment: [place] - 2 wheels;Walker - 4 wheels;Shower seat;Grab bars - toilet;Grab bars - tub/shower      Prior Function Level of Independence: Needs assistance   Gait / Transfers Assistance Needed: Using RW  ADL's / Homemaking Assistance Needed: Needs assistance to clean home  Hand Dominance   Dominant Hand: Right    Extremity/Trunk Assessment   Upper Extremity Assessment: Overall WFL for tasks assessed           Lower Extremity Assessment: RLE deficits/detail         Communication   Communication: No difficulties  Cognition Arousal/Alertness: Awake/alert Behavior During Therapy: WFL for tasks  assessed/performed Overall Cognitive Status: Within Functional Limits for tasks assessed                      General Comments General comments (skin integrity, edema, etc.): Pt able to wiggle all toes on R foot, States she is beginning to gain sensation however is still diminished with light touch upon assessment. Educated on safe use of DME with RW and how to use for safe ambulation by using UEs for support.    Exercises General Exercises - Lower Extremity Ankle Circles/Pumps: AROM;Left;10 reps;Seated Straight Leg Raises: AROM;Right;5 reps;Seated      Assessment/Plan    PT Assessment Patient needs continued PT services  PT Diagnosis Difficulty walking;Abnormality of gait;Acute pain   PT Problem List Decreased strength;Decreased range of motion;Decreased activity tolerance;Decreased balance;Decreased mobility;Decreased knowledge of use of DME;Impaired sensation;Pain  PT Treatment Interventions DME instruction;Gait training;Functional mobility training;Therapeutic activities;Therapeutic exercise;Balance training;Neuromuscular re-education;Modalities   PT Goals (Current goals can be found in the Care Plan section) Acute Rehab PT Goals Patient Stated Goal: No pain in ankle PT Goal Formulation: With patient Time For Goal Achievement: 04/08/14 Potential to Achieve Goals: Good    Frequency Min 3X/week   Barriers to discharge Decreased caregiver support pt lives alone    Co-evaluation               End of Session Equipment Utilized During Treatment: Gait belt Activity Tolerance: Patient tolerated treatment well Patient left: in chair;with call bell/phone within reach Nurse Communication: Mobility status         Time: 6144-3154 PT Time Calculation (min): 18 min   Charges:   PT Evaluation $Initial PT Evaluation Tier I: 1 Procedure PT Treatments $Self Care/Home Management: 8-22   PT G Codes:        Elayne Snare, [place] 04/01/2014, 1:52 PM

## 2014-04-01 NOTE — Progress Notes (Signed)
Utilization review completed.  

## 2014-04-01 NOTE — Consult Note (Signed)
[facility] Medical Consultation  [name] [id number] DOB: 12/14/1936 DOA: 03/31/2014 PCP: Redge Gainer, MD   Requesting physician: Dr. Doran Durand Date of consultation: 4/17 Reason for consultation: Cough  Impression/Recommendations Active Problems: REACTIVE AIRWAY DISEASE/asthmatic bronchitis exacerbation -Pt with non-productive cough, andwheezing on exam, no fevers, shortness of breath,hypoxia or leukocytosis -Chest x-ray with no acute infiltrates -Will continue bronchodilators-add scheduled meds and continue when necessary -Patient states that when she's had these episodes prednisone seems to be the only thing that helps consistently -discussed the risk slowing of healing in the setting of her recent surgery and she would like to go ahead get started on a prednisone. -continue mucolytics and add antitussives  HYPERTENSION -Controlled on current meds, monitor on prednisone further adjust meds as clinically appropriate   ATRIAL FIBRILLATION, PAROXYSMAL -Rate controlled on metoprolol -Continue xarelto   Nonunion of subtalar arthrodesis -Status post fusion surgery per orthopedics/primary        [facility] will followup again tomorrow. Please contact me if I can be of assistance in the meanwhile. Thank you for this consultation.  Chief Complaint: Cough and wheezing  HPI:  The patient is a 78 year old female with history of reactive airway disease/chronic bronchitis/asthmatic bronchitis followed by Dr. Elsworth Soho, and other of multiple medical problems as listed below admitted to orthopedic service on 4/15 for removal of the implants the right ankle and revision of right subtalar arthrodesis, and began having a nonproductive cough with wheezing today. Patient denies shortness of breath, chest pain and no fevers. She states that the cough is nonproductive. She reports she is followed by Dr. Elsworth Soho and that she's had along episodes with this in the past and prednisone seems to be one thing  that helps consistently. She denies orthopnea PND and leg swelling. [facility] consulted For cough/medical Management.  Review of Systems:  The patient denies anorexia, fever, weight loss,, vision loss, decreased hearing, hoarseness, chest pain, syncope, dyspnea on exertion, peripheral edema, hemoptysis, abdominal pain, melena, hematochezia,  hematuria, incontinence, genital sores, muscle weakness, suspicious skin lesions, transient blindness, difficulty walking, depression, unusual weight change, abnormal bleeding.   Past Medical History  Diagnosis Date  . GERD (gastroesophageal reflux disease)   . Anxiety disorder   . Arthritis   . CAD (coronary artery disease)     Cath May 2010.  Nonbstructive  . Depression   . Hypothyroid   . Asthmatic bronchitis   . Hypertension     dr Percival Spanish  . OA (osteoarthritis)   . Chronic bronchitis   . 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 Encephalitis     d/t meningitis  . PONV (postoperative nausea and vomiting)     history of cardiac arrest day 1 post surgery  in 2008  . Esophageal motility disorder   . Atrial fibrillation   . Hyperlipidemia   . Fibromyalgia   . Vitamin D deficiency   . Status post dilation of esophageal narrowing   . Bowel obstruction     blockage   Past Surgical History  Procedure Laterality Date  . Total knee arthroplasty Right   . Back surgery    . Coronary angioplasty with stent placement  2003  . Sinus surgery with instatrak    . Knee arthroscopy  03/26/2012    Procedure: ARTHROSCOPY KNEE;  Surgeon: Wylene Simmer, MD;  Location: [place];  Service: Orthopedics;  Laterality: Left;  with Debridement of Lateral Meniscus tear  . Rotator cuff repair Bilateral   .  Ankle fusion  08/27/2012    Procedure: ARTHRODESIS ANKLE;  Surgeon: Wylene Simmer, MD;  Location: [place];  Service: Orthopedics;  Laterality: Right;  Arthrodesis right ankle and subtalar joint  . Foot arthrodesis, subtalar Right 2013  . Removal of  implant Right 03/31/2014    DR HEWITT  . Hardware revision  03/31/2014    SUBTALOR  ARTHRODESIS       DR HEWITT   Social History:  reports that she quit smoking about 24 years ago. Her smoking use included Cigarettes. She has a 15 pack-year smoking history. She has never used smokeless tobacco. She reports that she does not drink alcohol or use illicit drugs.  Allergies  Allergen Reactions  . Aspirin Other (See Comments)    REACTION: regular strength causes "heart to beat fast"  . Captopril Hypertension  . Naproxen Other (See Comments)    Tongue swelling  . Penicillins     Swelling around site  . Sulfonamide Derivatives Hives  . Clindamycin/Lincomycin    Family History  Problem Relation Age of Onset  . Prostate cancer Brother   . Heart disease Mother   . Emphysema Sister   . Asthma Mother     Prior to Admission medications   Medication Sig Start Date End Date Taking? Authorizing Provider  acetaminophen (TYLENOL) 325 MG tablet Take 650 mg by mouth every 6 (six) hours as needed. For pain/fever   Yes Historical Provider, MD  albuterol (PROVENTIL HFA;VENTOLIN HFA) 108 (90 BASE) MCG/ACT inhaler Inhale 2 puffs into the lungs every 6 (six) hours as needed for wheezing. 09/06/13  Yes Chipper Herb, MD  albuterol (PROVENTIL) (2.5 MG/3ML) 0.083% nebulizer solution Take 2.5 mg by nebulization every 6 (six) hours as needed for wheezing or shortness of breath.   Yes Historical Provider, MD  ALPRAZolam Duanne Moron) 0.5 MG tablet TAKE 2 TABLETS AT BEDTIME AS NEEDED   Yes Mary-Margaret Hassell Done, FNP  Ascorbic Acid (VITAMIN C PO) Take 1 tablet by mouth every morning.   Yes Historical Provider, MD  Calcium Carbonate-Vitamin D (CALCIUM 600+D) 600-400 MG-UNIT per tablet Take 1 tablet by mouth daily at 6 PM.    Yes Historical Provider, MD  cholecalciferol (VITAMIN D) 1000 UNITS tablet Take 1,000 Units by mouth 2 (two) times daily with breakfast and lunch.    Yes Historical Provider, MD  cyclobenzaprine  (FLEXERIL) 10 MG tablet Take 10 mg by mouth 3 (three) times daily as needed for muscle spasms.   Yes Historical Provider, MD  denosumab (PROLIA) 60 MG/ML SOLN injection Inject 60 mg into the skin every 6 (six) months. Administer in upper arm, thigh, or abdomen   Yes Historical Provider, MD  dextromethorphan-guaiFENesin (MUCINEX DM) 30-600 MG per 12 hr tablet Take 1 tablet by mouth every 12 (twelve) hours. scheduled   Yes Historical Provider, MD  dicyclomine (BENTYL) 10 MG capsule Take 10-20 mg by mouth every 6 (six) hours as needed for spasms. 10-20mg  tablets depending on how bad the spasms are.   Yes Historical Provider, MD  fluticasone (FLONASE) 50 MCG/ACT nasal spray Place 2 sprays into both nostrils daily.   Yes Historical Provider, MD  isosorbide mononitrate (IMDUR) 60 MG 24 hr tablet Take 60 mg by mouth at bedtime.   Yes Historical Provider, MD  levothyroxine (SYNTHROID, LEVOTHROID) 25 MCG tablet Take 50 mcg by mouth daily before breakfast.   Yes Historical Provider, MD  loratadine (CLARITIN) 10 MG tablet Take 10 mg by mouth 2 (two) times daily.   Yes Historical Provider,  MD  metoprolol (LOPRESSOR) 50 MG tablet Take 50 mg by mouth 2 (two) times daily.   Yes Historical Provider, MD  montelukast (SINGULAIR) 10 MG tablet Take 10 mg by mouth at bedtime.   Yes Historical Provider, MD  Multiple Vitamin (MULTIVITAMIN WITH MINERALS) TABS Take 1 tablet by mouth daily.   Yes Historical Provider, MD  nitroGLYCERIN (NITROSTAT) 0.4 MG SL tablet Place 0.4 mg under the tongue every 5 (five) minutes as needed. For chest pain   Yes Historical Provider, MD  omega-3 acid ethyl esters (LOVAZA) 1 G capsule Take 2 g by mouth 2 (two) times daily.   Yes Historical Provider, MD  omeprazole-sodium bicarbonate (ZEGERID) 40-1100 MG per capsule Take 1 capsule by mouth daily before breakfast.   Yes Historical Provider, MD  ondansetron (ZOFRAN) 4 MG tablet Take 4 mg by mouth every 8 (eight) hours as needed for nausea or  vomiting.   Yes Historical Provider, MD  Rivaroxaban (XARELTO) 20 MG TABS tablet Take 20 mg by mouth daily with supper.   Yes Historical Provider, MD  rosuvastatin (CRESTOR) 20 MG tablet Take 20 mg by mouth at bedtime.   Yes Historical Provider, MD  sertraline (ZOLOFT) 100 MG tablet Take 100 mg by mouth at bedtime.   Yes Historical Provider, MD  traMADol (ULTRAM) 50 MG tablet Take 50 mg by mouth every 6 (six) hours as needed for moderate pain.  03/04/14  Yes Historical Provider, MD  VITAMIN E PO Take 1 capsule by mouth every morning.   Yes Historical Provider, MD   Physical Exam: Blood pressure 111/55, pulse 76, temperature 98.8 F (37.1 C), temperature source Oral, resp. rate 18, height 4' 11.84" (1.52 m), weight 69.9 kg (154 lb 1.6 oz), SpO2 91.00%. Filed Vitals:   04/01/14 1419  BP: 111/55  Pulse: 76  Temp: 98.8 F (37.1 C)  Resp: 18    Constitutional: Vital signs reviewed.  Patient is a well-developed and well-nourished  in no acute distress and cooperative with exam. Alert and oriented x3.  Head: Normocephalic and atraumatic Mouth: no erythema or exudates, MMM Eyes: PERRL, EOMI, conjunctivae normal, No scleral icterus.  Neck: Supple, Trachea midline normal ROM, No JVD, mass, thyromegaly, or carotid bruit present.  Cardiovascular: RRR, S1 normal, S2 normal, no MRG, pulses symmetric and intact bilaterally Pulmonary/Chest: normal respiratory effort, wheezes in lower lung fields bilaterally greater on right, no rales Abdominal: Soft. Non-tender, non-distended, bowel sounds are normal, no masses, organomegaly, or guarding present.  GU: no CVA tenderness  extremities: Right lower extremity with Ace wrap dressing clean and dry, left lower extremity with no cyanosis and no edema  Neurological: A&O x3, Strength is normal and symmetric bilaterally, cranial nerve II-XII are grossly intact, no focal motor deficit, sensory intact to light touch bilaterally.  Skin: Warm, dry and intact. No rash.   Psychiatric: Normal mood and affect. speech and behavior is normal.    Labs on Admission:  Basic Metabolic Panel:  Recent Labs Lab 03/31/14 1858  CREATININE 0.52   Liver Function Tests: No results found for this basename: AST, ALT, ALKPHOS, BILITOT, PROT, ALBUMIN,  in the last 168 hours No results found for this basename: LIPASE, AMYLASE,  in the last 168 hours No results found for this basename: AMMONIA,  in the last 168 hours CBC:  Recent Labs Lab 03/31/14 1858  WBC 9.4  HGB 12.6  HCT 35.6*  MCV 93.4  PLT 249   Cardiac Enzymes: No results found for this basename: CKTOTAL, CKMB, CKMBINDEX,  TROPONINI,  in the last 168 hours BNP: No components found with this basename: POCBNP,  CBG: No results found for this basename: GLUCAP,  in the last 168 hours  Radiological Exams on Admission: Dg Ankle Complete Right  03/31/2014   CLINICAL DATA:  Fracture with subtalar arthrodesis.  Revision.  EXAM: RIGHT ANKLE - COMPLETE 3+ VIEW; DG C-ARM 1-60 MIN  COMPARISON:  None.  FINDINGS: C-arm films document revision of subtalar arthrodesis. Intramedullary tibial rod has been removed.  IMPRESSION: No adverse features.   Electronically Signed   By: Rolla Flatten M.D.   On: 03/31/2014 16:42   Dg Chest Port 1 View  04/01/2014   CLINICAL DATA:  New onset of cough.  EXAM: PORTABLE CHEST - 1 VIEW  COMPARISON:  Chest x-ray 12/30/2013.  FINDINGS: Lung volumes are normal. No consolidative airspace disease. No pleural effusions. No evidence of pulmonary edema. Mild cardiomegaly. Upper mediastinal contours are slightly distorted by patient's rotation to the right. Atherosclerotic calcifications within the thoracic aorta.  IMPRESSION: 1. No radiographic evidence of acute cardiopulmonary disease. 2. Mild cardiomegaly. 3. Atherosclerosis.   Electronically Signed   By: Vinnie Langton M.D.   On: 04/01/2014 18:40   Dg C-arm 1-60 Min  03/31/2014   CLINICAL DATA:  Fracture with subtalar arthrodesis.  Revision.   EXAM: RIGHT ANKLE - COMPLETE 3+ VIEW; DG C-ARM 1-60 MIN  COMPARISON:  None.  FINDINGS: C-arm films document revision of subtalar arthrodesis. Intramedullary tibial rod has been removed.  IMPRESSION: No adverse features.   Electronically Signed   By: Rolla Flatten M.D.   On: 03/31/2014 16:42      Time spent: >98mins  Sheila Oats [facility] Pager [phone]  If 7PM-7AM, please contact night-coverage [email or web address] Password [facility] 04/01/2014, 7:08 PM

## 2014-04-01 NOTE — Clinical Social Work Placement (Addendum)
Clinical Social Work Department  CLINICAL SOCIAL WORK PLACEMENT NOTE    Patient: [name] Account Number: [id number]  Admit date: 03/31/14 Clinical Social Worker: Rhea Pink LCSWA Date/time: 04/01/2014 11:30 AM  Clinical Social Work is seeking post-discharge placement for this patient at the following level of care: SKILLED NURSING (*CSW will update this form in Epic as items are completed)   04/01/2014  Patient/family provided with [place] Department of Clinical Social Work's list of facilities offering this level of care within the geographic area requested by the patient (or if unable, by the patient's family).   04/01/2014  Patient/family informed of their freedom to choose among providers that offer the needed level of care, that participate in Medicare, Medicaid or managed care program needed by the patient, have an available bed and are willing to accept the patient.   04/01/2014  Patient/family informed of [facility]' ownership interest in [facility], as well as of the fact that they are under no obligation to receive care at this facility.  PASARR submitted to EDS on  04/01/2014   PASARR number received from EDS on  04/01/2014  FL2 transmitted to all facilities in geographic area requested by pt/family on  04/01/2014  FL2 transmitted to all facilities within larger geographic area on  Patient informed that his/her managed care company has contracts with or will negotiate with certain facilities, including the following:  Patient/family informed of bed offers received:  04/01/2014   Patient chooses bed at [facility] Physician recommends and patient chooses bed at  Patient to be transferred to on 04/04/14 Patient to be transferred to facility by [facility] The following physician request were entered in Epic:  Additional Comments:

## 2014-04-01 NOTE — Progress Notes (Signed)
Subjective: 1 Day Post-Op Procedure(s) (LRB): REMOVAL OF DEEP IMPLANTS X 3  RIGHT  (Right) REVISION OF SUBTALOR ARTHRODESIS  RIGHT  (Right) Patient doing well this AM. Pain well controlled, appetite good. Pt states she is unable to move her toes on the right foot and cannot feel them. Pt denies N/V/F/C, chest pain, SOB, or calf pain.  Objective: Vital signs in last 24 hours: Temp:  [97.5 F (36.4 C)-98 F (36.7 C)] 97.5 F (36.4 C) (04/17 0524) Pulse Rate:  [56-77] 61 (04/17 0524) Resp:  [12-20] 16 (04/17 0524) BP: (103-171)/(42-67) 103/50 mmHg (04/17 0524) SpO2:  [90 %-99 %] 91 % (04/17 0524) Weight:  [69.854 kg (154 lb)-69.9 kg (154 lb 1.6 oz)] 69.9 kg (154 lb 1.6 oz) (04/16 1918)  Intake/Output from previous day: 04/16 0701 - 04/17 0700 In: 3016 [P.O.:30; I.V.:1700] Out: 125 [Urine:125] Intake/Output this shift:     Recent Labs  03/31/14 1858  HGB 12.6    Recent Labs  03/31/14 1858  WBC 9.4  RBC 3.81*  HCT 35.6*  PLT 249    Recent Labs  03/31/14 1858  CREATININE 0.52   No results found for this basename: LABPT, INR,  in the last 72 hours  WD WN NAD A/Ox3, appears stated age. EOMI, mood and affect normal, respirations unlabored. On physical exam post op splint is c/d/i, in proper placement and in good repair.  Toes are not mobile but well perfused and are insensate at this time.  Cap refill <2sec.  Assessment/Plan: 1 Day Post-Op Procedure(s) (LRB): REMOVAL OF DEEP IMPLANTS X 3  RIGHT  (Right) REVISION OF SUBTALOR ARTHRODESIS  RIGHT  (Right) Begin PT for transferring  Numbness and immobility most likely due to nerve block, will continue to monitor  Continue Xarelto for anticoagulation Plan to discharge Monday  [place] 04/01/2014, 7:36 AM

## 2014-04-01 NOTE — Op Note (Signed)
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  PROCEDURES: 1. Removal of deep implants from the right tibia. 2. Removal of deep implants from the right talus through a separate     incision. 3. Removal of deep implants from the right calcaneus through separate     incision. 4. Revision, right subtalar arthrodesis through a separate incision.  SURGEON:  [name] Simmer, MD  ANESTHESIA:  General, regional.  ESTIMATED BLOOD LOSS:  Minimal.  TOURNIQUET TIME:  91 minutes at 250 mmHg.  COMPLICATIONS:  None apparent.  DISPOSITION:  Extubated, awake and stable to recovery.  INDICATIONS FOR PROCEDURE:  The patient is a 78 year old female with past medical history significant for restless legs and fibromyalgia. 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  She healed the tibiotalar joint without difficulty, but had a nonunion of the subtalar joint.  This has been persistently painful and limiting of her activities despite activity modification, pain medicine, and custom orthotics.  She presents now for removal of the TTC nail and all of the interlocking screws and revision, arthrodesis of the right subtalar joint.  She understands the risks and benefits, the alternative treatment options and elects surgical treatment.  She specifically understands risks of bleeding, infection, nerve damage, blood clots, need for additional surgery, amputation,  and death.  PROCEDURE IN DETAIL:  After preoperative consent was obtained, the correct operative site was identified.  The patient was brought to the operating room and placed supine on the operating table.  General anesthesia was induced.  Preoperative antibiotics were administered. Surgical time-out was taken.  The right lower extremity was prepped and draped in standard sterile fashion of the tourniquet around the thigh. The extremity was exsanguinated and tourniquet was inflated to 250 mmHg. The screw heads were palpated at the medial tibia.  The small incision was made and sharp dissection was carried down through the skin.  Blunt dissection was then carried down to the most proximal screw head.  Screw was backed out and removed in its entirety.  The more distal of the 2 screws was then identified and similarly removed without difficulty. Screw holes and the wounds were irrigated.  Horizontal mattress suture of 3-0 nylon was used to close the skin incision.  Attention was then turned to the plantar aspect of the foot where the previous insertion site was identified on the plantar aspect of the calcaneus.  The incision was made.  Sharp dissection was carried down through the skin.  Blunt dissection was carried down to the head of the nail.  This was cleared of soft tissue with a rongeur and curette.  The extraction handle was inserted into the  nail and threaded into place. It was tightened with a wrench.  Attention was then turned to the lateral aspect of the hindfoot, where a small incision was made over the screw in the talus.  Sharp dissection was carried down through the skin and subcutaneous tissue.  The screw head was identified.  The screw was removed in its entirety without difficulty.  At this point, a back slap extension was placed on the nail extraction handle.  A separate stab incision was made at the posterior aspect of the calcaneus and the most distal interlocking  screw was removed from the calcaneus.  A slotted hammer was then used to remove the nail from the calcaneus in its entirety.  A curette was then passed up in the canal and was used to remove any fibrous tissue.  The wound was irrigated copiously.  A running 3-0 nylon sutures were used to close the skin incision.  At this point, attention was turned to the lateral aspect of the hindfoot, where the patient's previous incision was identified.  The incision was opened again sharply, blunt dissection was then carried down through the subcutaneous tissue at the lateral aspect of the hindfoot.  AP and lateral radiographs were used to identify the subtalar joint.  A curette and rongeur were used to remove the fibrous tissue from the joint.  The joint was further opened with a lamina spreader and a Acupuncturist.  Once adequate motion of the joint was reestablished, the wound was irrigated copiously.  A 2.8 mm guide pin was then used to drill the subchondral bone on both sides of the joint, leaving the resultant bone graft in place.  An extra-small Infuse piece was used to form a burrito with demineralized bone matrix and cancellous allograft. This was packed into the joint at the location of the subtalar at the anterior portion of the posterior facet of the subtalar joint.  The remainder of the subtalar joint was packed with demineralized bone matrix and cancellous chips.  Joint was reduced and compressed appropriately.  The guide pin was then inserted from the posterolateral aspect of the calcaneus and across the subtalar and tibiotalar joints. AP ankle and lateral ankle radiographs were obtained to confirm appropriate position of this pin.  A 6.5 mm partially threaded cannulated screw from the [employer] set was selected.  This was inserted along with a washer in the posterior aspect of the calcaneus over the guidepin.  It was noted to have excellent compression across the subtalar joint.  Second  guide pin was then inserted just medial to the first and advanced into the head of the talus.  A stab incision was made, and a partially threaded 6.5-mm cannulated screw was inserted.  It was again noted to have excellent purchase.  AP foot, AP ankle and lateral ankle radiographs confirmed appropriate position and length of both of the screws.  A stab incision was then made at the level of the anterior process of the calcaneus at the plantar lateral foot.  The guide pin was inserted from the anterior process across to the neck of the talus.  A fully-threaded 6.5-mm cannulated screw was inserted and was noted to have appropriate purchase.  Guide pin was removed.  Final AP ankle, AP foot and lateral ankle radiographs confirmed appropriate position and length of all hardware and appropriate reduction of the subtalar joint.  The lateral wound was then irrigated.  The remaining bone matrix was packed into the sinus tarsi.  Subcutaneous tissue was  approximated with inverted simple sutures of 3-0 Monocryl.  A running 3- 0 nylon was used to close the skin incision.  The remaining stab incisions were all closed with horizontal mattress sutures of 3-0 nylon. Sterile dressings were applied followed by a well-padded short-leg splint.  Tourniquet was released at 1 hour and 31 minute.  The patient was awakened from anesthesia and transported to the recovery room in stable condition.  FOLLOWUP PLAN:  The patient will be nonweightbearing on the right lower extremity.  She will be admitted for this inpatient only procedure.  She will require placement in a skilled nursing facility for her convalescence.     [name] Simmer, MD     JH/MEDQ  D:  03/31/2014  T:  04/01/2014  Job:  [id number]

## 2014-04-01 NOTE — Clinical Social Work Psychosocial (Signed)
Clinical Social Work Department  BRIEF PSYCHOSOCIAL ASSESSMENT  Patient: [name] Account Number: [id number]  Admit date: 03/31/14 Clinical Social Worker Rhea Pink, MSW Date/Time: 04/01/2014 12:30 PM Referred by: Physician Date Referred:  Referred for   SNF Placement   Other Referral:  Interview type: Patient  Other interview type: PSYCHOSOCIAL DATA  Living Status: Alone Admitted from facility:  Level of care:  Primary support name: Malka So Primary support relationship to patient: Son Degree of support available:  Strong and vested according to the patient  CURRENT CONCERNS  Current Concerns   Post-Acute Placement   Other Concerns:  SOCIAL WORK ASSESSMENT / PLAN  CSW met with pt at bedside to offer support and discuss SNF placement. Patient reported that she pre-registered at [facility] and she was going to have a private room. CSW explained that all paperwork would be completed for her to go to SNF. re: PT recommendation for SNF.   Pt lives alon  CSW explained placement process and answered questions.   Pt reports [facility]  as her preference    CSW completed FL2 and initiated SNF search.     Assessment/plan status: Information/Referral to [employer]  Other assessment/ plan:  Information/referral to community resources:  SNF   PTAR  PATIENT'S/FAMILY'S RESPONSE TO PLAN OF CARE:  MD contacted CSW to inform CSW that patient is requesting to go to SNF. Pt  reports she is agreeable to [facility] in order to increase strength and independence with mobility prior to returning home  Pt verbalized understanding of placement process and appreciation for CSW assist.   Rhea Pink, MSW, [place]

## 2014-04-02 MED ORDER — IPRATROPIUM-ALBUTEROL 0.5-2.5 (3) MG/3ML IN SOLN
3.0000 mL | Freq: Four times a day (QID) | RESPIRATORY_TRACT | Status: DC
  Administered 2014-04-02 – 2014-04-04 (×5): 3 mL via RESPIRATORY_TRACT
  Filled 2014-04-02 (×6): qty 3

## 2014-04-02 MED ORDER — ALBUTEROL SULFATE (2.5 MG/3ML) 0.083% IN NEBU
2.5000 mg | INHALATION_SOLUTION | Freq: Four times a day (QID) | RESPIRATORY_TRACT | Status: DC
  Administered 2014-04-02: 2.5 mg via RESPIRATORY_TRACT
  Filled 2014-04-02: qty 3

## 2014-04-02 MED ORDER — ALBUTEROL SULFATE (2.5 MG/3ML) 0.083% IN NEBU: 2.5000 mg | INHALATION_SOLUTION | RESPIRATORY_TRACT | Status: DC | PRN

## 2014-04-02 MED ORDER — CALCIUM CARBONATE ANTACID 500 MG PO CHEW
1.0000 | CHEWABLE_TABLET | Freq: Three times a day (TID) | ORAL | Status: DC | PRN
  Filled 2014-04-02 (×3): qty 1

## 2014-04-02 NOTE — Progress Notes (Signed)
Subjective: 2 Days Post-Op Procedure(s) (LRB): REMOVAL OF DEEP IMPLANTS X 3  RIGHT  (Right) REVISION OF SUBTALOR ARTHRODESIS  RIGHT  (Right) Patient reports pain as mild.  Well controlled with oral pain meds.  No n/v.  Hospitalist consulted yesterday due to coughing and wheezing.  Notes reviewed.  Prednisone to start today along with breathing treatments.  Objective: Vital signs in last 24 hours: Temp:  [98.6 F (37 C)-99.1 F (37.3 C)] 99.1 F (37.3 C) (04/18 0514) Pulse Rate:  [76-79] 78 (04/18 0514) Resp:  [16-18] 16 (04/18 0514) BP: (111-130)/(55-81) 126/62 mmHg (04/18 0514) SpO2:  [91 %-96 %] 93 % (04/18 0514)  Intake/Output from previous day: 04/17 0701 - 04/18 0700 In: 840 [P.O.:840] Out: 275 [Urine:275] Intake/Output this shift:     Recent Labs  03/31/14 1858  HGB 12.6    Recent Labs  03/31/14 1858  WBC 9.4  RBC 3.81*  HCT 35.6*  PLT 249    Recent Labs  03/31/14 1858  CREATININE 0.52   No results found for this basename: LABPT, INR,  in the last 72 hours  PE:  WN WD woman in nad.  Audibly wheezing.  Coughing intermittently.  R LE in splint.  NVI at R forefoot.  Assessment/Plan: 2 Days Post-Op Procedure(s) (LRB): REMOVAL OF DEEP IMPLANTS X 3  RIGHT  (Right) REVISION OF SUBTALOR ARTHRODESIS  RIGHT  (Right) Up with therapy  SNF placement pending.  Prednisone per hospitalist for wheezing / h/o bronchitis.  Wylene Simmer 04/02/2014, 8:59 AM

## 2014-04-02 NOTE — Evaluation (Signed)
Occupational Therapy Evaluation Patient Details Name: [name] MRN: [id number] DOB: 07-Oct-1936 Today's Date: 04/02/2014    History of Present Illness 78 y.o. female s/p REMOVAL OF DEEP IMPLANTS X 3  and REVISION OF SUBTALOR ARTHRODESIS on RIGHT   Clinical Impression   Pt currently limited by pain in R foot 8/10 at end of session.  She fatigues rapidly with standing and maintaining NWB through R LE as her bilateral shoulders are weak premorbidly. She is motivated and wants to do for herself. Will benefit from continued OT services to improve ADL independence.     Follow Up Recommendations  SNF;Supervision/Assistance - 24 hour    Equipment Recommendations  3 in 1 bedside comode    Recommendations for Other Services       Precautions / Restrictions Precautions Precautions: Fall Precaution Comments: fatigues rapidly Restrictions Weight Bearing Restrictions: Yes RLE Weight Bearing: Non weight bearing      Mobility Bed Mobility Overal bed mobility: Needs Assistance Bed Mobility: Supine to Sit;Sit to Supine     Supine to sit: Min assist;HOB elevated Sit to supine: Min assist;HOB elevated   General bed mobility comments: assist to scoot hips around to EOB. Assist to reposition R LE back into bed.  Transfers Overall transfer level: Needs assistance Equipment used: Rolling walker (2 wheeled) Transfers: Sit to/from Stand Sit to Stand: Min assist;+2 safety/equipment         General transfer comment: verbal cues for hand placement. +2 for safety as pt fatigues rapidly.    Balance                                            ADL Overall ADL's : Needs assistance/impaired Eating/Feeding: Independent;Bed level   Grooming: Wash/dry hands;Set up;Sitting   Upper Body Bathing: Supervision/ safety;Set up;Sitting   Lower Body Bathing: Moderate assistance;+2 for safety/equipment;Sit to/from stand   Upper Body Dressing : Minimal assistance;Sitting    Lower Body Dressing: Maximal assistance;Sit to/from stand;+2 for safety/equipment   Toilet Transfer: Moderate assistance;Stand-pivot;BSC;RW;+2 for safety/equipment   Toileting- Clothing Manipulation and Hygiene: Sitting/lateral lean;Min guard       Functional mobility during ADLs: Rolling walker General ADL Comments: Pt with decreased standing tolerance with NWB R LE due to decreased UE strength. She fatigues rapidly and has to sit prematurely before all the way back to BSC/bed so had to pull up [facility] and help her descend safely. Verbal cues for hand placement. Encouraged her to perform AROM bilateral shoulder flexion X 10 several times per day to work toward improved strength in UEs. She is unable to take a "hop step" around to [facility] so used heel/toe slide.      Vision                     Perception     Praxis      Pertinent Vitals/Pain 6/10 at start of session; 8/10 end of session R foot; informed nursing, reposition     Hand Dominance Right   Extremity/Trunk Assessment Upper Extremity Assessment Upper Extremity Assessment: RUE deficits/detail;LUE deficits/detail RUE Deficits / Details: grossly AROM to about 90 degrees comfortably. Pt states her shoulders have given her diffriculty for awhile. History of rotator cuff surgery bilaterally per pt. elbow to distal WFL.           Communication Communication Communication: No difficulties   Cognition Arousal/Alertness: Awake/alert Behavior  During Therapy: WFL for tasks assessed/performed Overall Cognitive Status: Within Functional Limits for tasks assessed                     General Comments       Exercises       Shoulder Instructions      Home Living Family/patient expects to be discharged to:: Skilled nursing facility Living Arrangements: Alone Available Help at Discharge: [place] Type of Home: Apartment Home Access: Level entry     Home Layout: One level                Home Equipment: Walker - 2 wheels;Walker - 4 wheels;Shower seat;Grab bars - toilet;Grab bars - tub/shower          Prior Functioning/Environment Level of Independence: Needs assistance  Gait / Transfers Assistance Needed: Using RW ADL's / Homemaking Assistance Needed: Needs assistance to clean home        OT Diagnosis: Generalized weakness;Acute pain   OT Problem List: Decreased strength;Decreased knowledge of use of DME or AE;Decreased knowledge of precautions;Pain   OT Treatment/Interventions: Self-care/ADL training;Patient/family education;Therapeutic activities;DME and/or AE instruction    OT Goals(Current goals can be found in the care plan section) Acute Rehab OT Goals Patient Stated Goal: decrease pain OT Goal Formulation: With patient Time For Goal Achievement: 04/09/14 Potential to Achieve Goals: Good  OT Frequency: Min 2X/week   Barriers to D/C:            Co-evaluation              End of Session Equipment Utilized During Treatment: Gait belt;Rolling walker  Activity Tolerance: Patient limited by pain Patient left: in bed;with call bell/phone within reach   Time: 1507-1526 OT Time Calculation (min): 19 min Charges:  OT General Charges $OT Visit: 1 Procedure OT Evaluation $Initial OT Evaluation Tier I: 1 Procedure OT Treatments $Therapeutic Activity: 8-22 mins G-Codes:    Jules Schick [phone] 04/02/2014, 3:48 PM

## 2014-04-02 NOTE — Progress Notes (Signed)
PROGRESS NOTE  [name] EXB:[id number] DOB: [birth year] DOA: 03/31/2014 PCP: Redge Gainer, MD  Assessment/Plan:  REACTIVE AIRWAY DISEASE/asthmatic bronchitis exacerbation  -Pt with non-productive cough, andwheezing on exam, no fevers, shortness of breath,hypoxia or leukocytosis during initial consult  -Chest x-ray with no acute infiltrates  -Will continue bronchodilators-add scheduled meds and continue when necessary  -Patient states that when she's had these episodes prednisone seems to be the only thing that helps consistently -discussed the risk slowing of healing in the setting of her recent surgery and she would like to go ahead get started on a prednisone.  -continue mucolytics and add antitussives  -would suggest rapid taper of steroids as patient is not wheezing now and attributes this to acid reflux  GERD -protonix BID -small meals  HYPERTENSION  -Controlled on current meds, monitor on prednisone further adjust meds as clinically appropriate   ATRIAL FIBRILLATION, PAROXYSMAL  -Rate controlled on metoprolol  -Continue xarelto   Nonunion of subtalar arthrodesis  -Status post fusion surgery per orthopedics/primary      HPI/Subjective: Patient thinks episode was from GERD  Objective: Filed Vitals:   04/02/14 0514  BP: 126/62  Pulse: 78  Temp: 99.1 F (37.3 C)  Resp: 16    Intake/Output Summary (Last 24 hours) at 04/02/14 1020 Last data filed at 04/01/14 1700  Gross per 24 hour  Intake    480 ml  Output    125 ml  Net    355 ml   Filed Weights   03/31/14 1139 03/31/14 1918  Weight: 69.854 kg (154 lb) 69.9 kg (154 lb 1.6 oz)    Exam:   General: Alert  Cardiovascular: rrr  Respiratory: no wheezing  Abdomen: +BS, soft  Musculoskeletal: right leg in cast   Data Reviewed: Basic Metabolic Panel:  Recent Labs Lab 03/31/14 1858  CREATININE 0.52   Liver Function Tests: No results found for this basename: AST, ALT, ALKPHOS, BILITOT, PROT,  ALBUMIN,  in the last 168 hours No results found for this basename: LIPASE, AMYLASE,  in the last 168 hours No results found for this basename: AMMONIA,  in the last 168 hours CBC:  Recent Labs Lab 03/31/14 1858  WBC 9.4  HGB 12.6  HCT 35.6*  MCV 93.4  PLT 249   Cardiac Enzymes: No results found for this basename: CKTOTAL, CKMB, CKMBINDEX, TROPONINI,  in the last 168 hours BNP (last 3 results) No results found for this basename: PROBNP,  in the last 8760 hours CBG: No results found for this basename: GLUCAP,  in the last 168 hours  No results found for this or any previous visit (from the past 240 hour(s)).   Studies: Dg Ankle Complete Right  03/31/2014   CLINICAL DATA:  Fracture with subtalar arthrodesis.  Revision.  EXAM: RIGHT ANKLE - COMPLETE 3+ VIEW; DG C-ARM 1-60 MIN  COMPARISON:  None.  FINDINGS: C-arm films document revision of subtalar arthrodesis. Intramedullary tibial rod has been removed.  IMPRESSION: No adverse features.   Electronically Signed   By: Rolla Flatten M.D.   On: 03/31/2014 16:42   Dg Chest Port 1 View  04/01/2014   CLINICAL DATA:  New onset of cough.  EXAM: PORTABLE CHEST - 1 VIEW  COMPARISON:  Chest x-ray 12/30/2013.  FINDINGS: Lung volumes are normal. No consolidative airspace disease. No pleural effusions. No evidence of pulmonary edema. Mild cardiomegaly. Upper mediastinal contours are slightly distorted by patient's rotation to the right. Atherosclerotic calcifications within the thoracic aorta.  IMPRESSION: 1. No  radiographic evidence of acute cardiopulmonary disease. 2. Mild cardiomegaly. 3. Atherosclerosis.   Electronically Signed   By: Vinnie Langton M.D.   On: 04/01/2014 18:40   Dg C-arm 1-60 Min  03/31/2014   CLINICAL DATA:  Fracture with subtalar arthrodesis.  Revision.  EXAM: RIGHT ANKLE - COMPLETE 3+ VIEW; DG C-ARM 1-60 MIN  COMPARISON:  None.  FINDINGS: C-arm films document revision of subtalar arthrodesis. Intramedullary tibial rod has been  removed.  IMPRESSION: No adverse features.   Electronically Signed   By: Rolla Flatten M.D.   On: 03/31/2014 16:42    Scheduled Meds: . albuterol  2.5 mg Nebulization QID  . benzonatate  200 mg Oral TID  . calcium-vitamin D  1 tablet Oral q1800  . cholecalciferol  1,000 Units Oral BID WC  . dextromethorphan-guaiFENesin  1 tablet Oral Q12H  . fluticasone  2 spray Each Nare Daily  . isosorbide mononitrate  60 mg Oral QHS  . levothyroxine  50 mcg Oral QAC breakfast  . loratadine  10 mg Oral BID  . metoprolol  50 mg Oral BID  . montelukast  10 mg Oral QHS  . multivitamin with minerals  1 tablet Oral Daily  . omega-3 acid ethyl esters  2 g Oral BID  . pantoprazole  40 mg Oral BID AC  . predniSONE  40 mg Oral Q breakfast  . rivaroxaban  20 mg Oral Q breakfast  . rosuvastatin  20 mg Oral q1800  . sertraline  100 mg Oral QHS  . vitamin C  1,000 mg Oral BID   Continuous Infusions:  Antibiotics Given (last 72 hours)   Date/Time Action Medication Dose   03/31/14 1430 Given   ceFAZolin (ANCEF) IVPB 2 g/50 mL premix 2 g   03/31/14 1527 Given   polymyxin B 500,000 Units, bacitracin 50,000 Units in sodium chloride irrigation 0.9 % 500 mL irrigation       Active Problems:   HYPERTENSION   ATRIAL FIBRILLATION, PAROXYSMAL   REACTIVE AIRWAY DISEASE   Nonunion of subtalar arthrodesis    Time spent: 25 min    Geradine Girt  [facility] Pager [phone]. If 7PM-7AM, please contact night-coverage at [email or web address], password [facility] 04/02/2014, 10:20 AM  LOS: 2 days

## 2014-04-02 NOTE — Progress Notes (Signed)
PT Cancellation Note  Patient Details Name: [name] MRN: [id number] DOB: 05/27/1936   Cancelled Treatment:    Reason Eval/Treat Not Completed: Pain limiting ability to participate  Pt asks to be seen tomorrow for physical therapy as her pain is unbearable at the moment. She appeared visibly tired and was grimacing at time when she attempted to move. PT will follow up with pt in the AM to progress with therapy.  [place], [place]   [name] 04/02/2014, 4:52 PM

## 2014-04-03 MED ORDER — POLYETHYLENE GLYCOL 3350 17 G PO PACK
17.0000 g | PACK | Freq: Every day | ORAL | Status: DC
  Administered 2014-04-03 – 2014-04-04 (×2): 17 g via ORAL
  Filled 2014-04-03 (×3): qty 1

## 2014-04-03 MED ORDER — PREDNISONE 20 MG PO TABS
20.0000 mg | ORAL_TABLET | Freq: Every day | ORAL | Status: DC
  Administered 2014-04-04: 20 mg via ORAL
  Filled 2014-04-03 (×2): qty 1

## 2014-04-03 NOTE — Progress Notes (Signed)
[name]  MRN: [id number] DOB/Age: 04-28-1936 78 y.o. Physician: Rada Hay Procedure: Procedure(s) (LRB): REMOVAL OF DEEP IMPLANTS X 3  RIGHT  (Right) REVISION OF SUBTALOR ARTHRODESIS  RIGHT  (Right)     Subjective: Pain controlled but still with ongoing wheezing and coughing. Receiving nebs and to start prednisone, perhaps some mild improvements from yesterday  Vital Signs Temp:  [98.8 F (37.1 C)-99.8 F (37.7 C)] 99.3 F (37.4 C) (04/19 0552) Pulse Rate:  [80-86] 85 (04/19 0552) Resp:  [16] 16 (04/19 0552) BP: (107-126)/(55-64) 107/56 mmHg (04/19 0552) SpO2:  [93 %-95 %] 95 % (04/19 0552)  Lab Results  Recent Labs  03/31/14 1858  WBC 9.4  HGB 12.6  HCT 35.6*  PLT 249   BMET  Recent Labs  03/31/14 1858  CREATININE 0.52   INR  Date Value Ref Range Status  03/17/2013 3.0   Final  05/03/2009 1.0  0.00 - 1.49 Final     Exam Moves toes well Splint in good condition          Plan Continue current treatment Plan to [facility] [place]OT [facility] Loges for Dr.Kevin Supple 04/03/2014, 8:47 AM

## 2014-04-03 NOTE — Progress Notes (Signed)
PROGRESS NOTE  [name] WRU:[id number] DOB: [birth year] DOA: 03/31/2014 PCP: Redge Gainer, MD  Assessment/Plan:  REACTIVE AIRWAY DISEASE/asthmatic bronchitis exacerbation  No wheeze today. Decrease pred to 20 mg today, 10 mg tomorrow, then off   GERD -protonix BID  HYPERTENSION  -Controlled on current meds, monitor on prednisone further adjust meds as clinically appropriate   ATRIAL FIBRILLATION, PAROXYSMAL  -Rate controlled on metoprolol  -Continue xarelto   Nonunion of subtalar arthrodesis   HPI/Subjective: Patient thinks episode was from GERD. Breathing easier  Objective: Filed Vitals:   04/03/14 1443  BP: 135/64  Pulse: 78  Temp: 98 F (36.7 C)  Resp: 16    Intake/Output Summary (Last 24 hours) at 04/03/14 1755 Last data filed at 04/03/14 0554  Gross per 24 hour  Intake    360 ml  Output      0 ml  Net    360 ml   Filed Weights   03/31/14 1139 03/31/14 1918  Weight: 69.854 kg (154 lb) 69.9 kg (154 lb 1.6 oz)    Exam:   General: Alert, talkative. Breathing nonlabored.  Cardiovascular: rrr  Respiratory: no wheezing. Dry cough  Abdomen: +BS, soft  Musculoskeletal: right leg in cast   Data Reviewed: Basic Metabolic Panel:  Recent Labs Lab 03/31/14 1858  CREATININE 0.52   Liver Function Tests: No results found for this basename: AST, ALT, ALKPHOS, BILITOT, PROT, ALBUMIN,  in the last 168 hours No results found for this basename: LIPASE, AMYLASE,  in the last 168 hours No results found for this basename: AMMONIA,  in the last 168 hours CBC:  Recent Labs Lab 03/31/14 1858  WBC 9.4  HGB 12.6  HCT 35.6*  MCV 93.4  PLT 249   Cardiac Enzymes: No results found for this basename: CKTOTAL, CKMB, CKMBINDEX, TROPONINI,  in the last 168 hours BNP (last 3 results) No results found for this basename: PROBNP,  in the last 8760 hours CBG: No results found for this basename: GLUCAP,  in the last 168 hours  No results found for this or any  previous visit (from the past 240 hour(s)).   Studies: Dg Chest Port 1 View  04/01/2014   CLINICAL DATA:  New onset of cough.  EXAM: PORTABLE CHEST - 1 VIEW  COMPARISON:  Chest x-ray 12/30/2013.  FINDINGS: Lung volumes are normal. No consolidative airspace disease. No pleural effusions. No evidence of pulmonary edema. Mild cardiomegaly. Upper mediastinal contours are slightly distorted by patient's rotation to the right. Atherosclerotic calcifications within the thoracic aorta.  IMPRESSION: 1. No radiographic evidence of acute cardiopulmonary disease. 2. Mild cardiomegaly. 3. Atherosclerosis.   Electronically Signed   By: Vinnie Langton M.D.   On: 04/01/2014 18:40    Scheduled Meds: . benzonatate  200 mg Oral TID  . calcium-vitamin D  1 tablet Oral q1800  . cholecalciferol  1,000 Units Oral BID WC  . dextromethorphan-guaiFENesin  1 tablet Oral Q12H  . fluticasone  2 spray Each Nare Daily  . ipratropium-albuterol  3 mL Nebulization Q6H  . isosorbide mononitrate  60 mg Oral QHS  . levothyroxine  50 mcg Oral QAC breakfast  . loratadine  10 mg Oral BID  . metoprolol  50 mg Oral BID  . montelukast  10 mg Oral QHS  . multivitamin with minerals  1 tablet Oral Daily  . omega-3 acid ethyl esters  2 g Oral BID  . pantoprazole  40 mg Oral BID AC  . polyethylene glycol  17 g Oral  Daily  . predniSONE  40 mg Oral Q breakfast  . rivaroxaban  20 mg Oral Q breakfast  . rosuvastatin  20 mg Oral q1800  . sertraline  100 mg Oral QHS  . vitamin C  1,000 mg Oral BID   Continuous Infusions:  Antibiotics Given (last 72 hours)   None     Time spent: 25 min  Delfina Redwood, M.D. [facility] Pager [phone]. If 7PM-7AM, please contact night-coverage at [email or web address], password [facility] 04/03/2014, 5:55 PM  LOS: 3 days

## 2014-04-03 NOTE — Progress Notes (Signed)
Physical Therapy Treatment Patient Details Name: [name] MRN: [id number] DOB: [birth year] Today's Date: 04/03/2014    History of Present Illness 78 y.o. female s/p REMOVAL OF DEEP IMPLANTS X 3  and REVISION OF SUBTALOR ARTHRODESIS on RIGHT    PT Comments    Pt progressing well with therapy. Gait distance limited to 12 feet by fatigue and complaints of shoulder pain bilaterally. Focusing on energy conservation with gait training. Pt will benefit from continued skilled PT services to increase level of independence with functional mobility. Continue to recommend SNF for d/c as she will continue to progress well in this setting.  Follow Up Recommendations  SNF;Supervision/Assistance - 24 hour     Equipment Recommendations  Wheelchair (measurements PT);Wheelchair cushion (measurements PT)    Recommendations for Other Services       Precautions / Restrictions Precautions Precautions: None Restrictions Weight Bearing Restrictions: Yes RLE Weight Bearing: Non weight bearing    Mobility  Bed Mobility Overal bed mobility: Modified Independent             General bed mobility comments: supine>sit at EOB without assist from PT. HOB was elevated minimally.  Transfers Overall transfer level: Needs assistance Equipment used: Rolling walker (2 wheeled) Transfers: Sit to/from [facility] Sit to Stand: Min assist;+2 safety/equipment Stand pivot transfers: Min assist;+2 safety/equipment       General transfer comment: Min assist +2 for safety and equipment for stand pivot transfer from bed BSC. Pt needed min assist for RW control with verbal cues for walker placement. Demonstrates good balance overall, however is quick to sit and needs cued to slowly lower herself.  Sit<>stand from bsc and bed from lowest setting, cues for hand placement. Good control of RLE to maintain NWB status  Ambulation/Gait Ambulation/Gait assistance: Min assist;+2  safety/equipment Ambulation Distance (Feet): 12 Feet (additional bout of 6 feet) Assistive device: Rolling walker (2 wheeled) Gait Pattern/deviations:  ("hop-to" pattern)     General Gait Details: Pt ambulates up to 12 feet before needing a seated rest break. Additional short bout of 6 feet, as pt fatigues easily. States UEs are tired/weak as well as LLE. Some complaints of shoulder pain due to WB thorough RW bilaterally, however with verbal cues to tuck elbows, pt states this helps reduce the pain. Continues to take several small hops, but progressed with intermittent large steps with LLE. +2 was available for safety, but pt needed min assist for RW placement. Pt maintains NWB status on RLE.   Stairs            Wheelchair Mobility    Modified Rankin (Stroke Patients Only)       Balance                                    Cognition Arousal/Alertness: Awake/alert Behavior During Therapy: WFL for tasks assessed/performed Overall Cognitive Status: Within Functional Limits for tasks assessed                      Exercises      General Comments General comments (skin integrity, edema, etc.): Reviewed precautions, pt is aware of NWB status and follows this status well. Good toe movement in operated extremity, and states her sensation is slowly returning. Pt with noticable wheezing and cough throughout therapy. States she is not feeling any better today.      Pertinent Vitals/Pain Pt denies pain at current,  however complains of her cough that "is not better today." Pt repositioned in chair for comfort. RLE elevated.    Home Living                      Prior Function            PT Goals (current goals can now be found in the care plan section) Acute Rehab PT Goals Patient Stated Goal: No pain in ankle PT Goal Formulation: With patient Time For Goal Achievement: 04/08/14 Potential to Achieve Goals: Good    Frequency  Min 3X/week    PT  Plan      Co-evaluation             End of Session Equipment Utilized During Treatment: Gait belt Activity Tolerance: Patient tolerated treatment well Patient left: in chair;with call bell/phone within reach     Time: 1007-1025 PT Time Calculation (min): 18 min  Charges:  $Gait Training: 8-22 mins                    G Codes:      Camille Bal [place], [place] 04/03/2014, 1:45 PM

## 2014-04-04 ENCOUNTER — Encounter (HOSPITAL_COMMUNITY): Payer: Self-pay | Admitting: *Deleted

## 2014-04-04 MED ORDER — RIVAROXABAN 10 MG PO TABS: 10.0000 mg | ORAL_TABLET | Freq: Every day | ORAL | Status: DC

## 2014-04-04 MED ORDER — OXYCODONE HCL 5 MG PO TABS: 5.0000 mg | ORAL_TABLET | ORAL | Status: DC | PRN

## 2014-04-04 MED ORDER — PREDNISONE 1 MG PO TABS: 10.0000 mg | ORAL_TABLET | Freq: Every day | ORAL | Status: DC

## 2014-04-04 MED ORDER — RIVAROXABAN 20 MG PO TABS: 20.0000 mg | ORAL_TABLET | Freq: Every day | ORAL | Status: DC

## 2014-04-04 MED ORDER — RIVAROXABAN 20 MG PO TABS: 10.0000 mg | ORAL_TABLET | Freq: Every day | ORAL | Status: DC

## 2014-04-04 NOTE — Progress Notes (Signed)
Subjective: 4 Days Post-Op Procedure(s) (LRB): REMOVAL OF DEEP IMPLANTS X 3  RIGHT  (Right) REVISION OF SUBTALOR ARTHRODESIS  RIGHT  (Right) Patient doing well this AM, progressing well with physical therapy, pain well controlled. Pt reports no exacerbation of COPD symptoms last night or this AM.  Pt denies N/V/F/C, chest pain, SOB, calf pain, or paresthesia b/l.   Objective: Vital signs in last 24 hours: Temp:  [97.6 F (36.4 C)-97.7 F (36.5 C)] 97.6 F (36.4 C) (04/20 1444) Pulse Rate:  [72-90] 90 (04/20 1444) Resp:  [18] 18 (04/20 1444) BP: (123-144)/(51-82) 123/82 mmHg (04/20 1444) SpO2:  [95 %-96 %] 95 % (04/20 1444)  Intake/Output from previous day: 04/19 0701 - 04/20 0700 In: 480 [P.O.:480] Out: -  Intake/Output this shift: Total I/O In: 480 [P.O.:480] Out: -   No results found for this basename: HGB,  in the last 72 hours No results found for this basename: WBC, RBC, HCT, PLT,  in the last 72 hours No results found for this basename: NA, K, CL, CO2, BUN, CREATININE, GLUCOSE, CALCIUM,  in the last 72 hours No results found for this basename: LABPT, INR,  in the last 72 hours  WD WN 78y/o female in NAD, A/Ox3, appears stated age. EOMI, mood and affect normal, respirations unlabored.  On physical exam splint C/D/I, in proper placement and in good repair.  Toes well mobile, well perfused, with cap refill <2sec. Normal sensation to light touch intact.   Assessment/Plan: 4 Days Post-Op Procedure(s) (LRB): REMOVAL OF DEEP IMPLANTS X 3  RIGHT  (Right) REVISION OF SUBTALOR ARTHRODESIS  RIGHT  (Right) Discharge to SNF Continue Xarelto 20mg  qday for anticoagulation F/u with Dr. Doran Durand in 2 weeks I spoke with Dr. Conley Canal from [facility] this AM who informed me pt was cleared to be discharged as long as she completes the prednisone 10mg  1 tablet tomorrow. This prescription was written and pt will complete medication as instructed.  Judeth Cornfield Flowers 04/04/2014, 5:24  PM

## 2014-04-04 NOTE — Discharge Instructions (Signed)
[name] Simmer, MD [place]  Please read the following information regarding your care after surgery.  Medications  You only need a prescription for the narcotic pain medicine (ex. oxycodone, Percocet, Norco).  All of the other medicines listed below are available over the counter. X acetominophen (Tylenol) 650 mg every 4-6 hours as you need for minor pain X oxycodone as prescribed for moderate to severe pain   Narcotic pain medicine (ex. oxycodone, Percocet, Vicodin) will cause constipation.  To prevent this problem, take the following medicines while you are taking any pain medicine. X docusate sodium (Colace) 100 mg twice a day X senna (Senokot) 2 tablets twice a day  X To help prevent blood clots, take Xarelto 10mg  once a day for a month after surgery.  You should also get up every hour while you are awake to move around.    Weight Bearing X Do not bear any weight on the operated leg or foot.  Cast / Splint / Dressing X Keep your splint or cast clean and dry.  Dont put anything (coat hanger, pencil, etc) down inside of it.  If it gets damp, use a hair dryer on the cool setting to dry it.  If it gets soaked, call the office to schedule an appointment for a cast change.    Swelling It is normal for you to have swelling where you had surgery.  To reduce swelling and pain, keep your toes above your nose for at least 3 days after surgery.  It may be necessary to keep your foot or leg elevated for several weeks.  If it hurts, it should be elevated.  Follow Up Call my office at [phone] when you are discharged from the hospital or surgery center to schedule an appointment to be seen two weeks after surgery.  Call my office at [phone] if you develop a fever >101.5 F, nausea, vomiting, bleeding from the surgical site or severe pain.

## 2014-04-04 NOTE — Progress Notes (Signed)
Clinical social worker assisted with patient discharge to skilled nursing facility, [facility].  CSW addressed all family questions and concerns. CSW copied chart and added all important documents. CSW also set up patient transportation with [facility]. Clinical Social Worker will sign off for now as social work intervention is no longer needed.   [name], MSW, LCSWA [phone] 

## 2014-04-04 NOTE — Discharge Summary (Signed)
Physician Discharge Summary  Patient ID: [name] MRN: [id number] DOB/AGE: 1936/11/01 78 y.o.  Admit date: 03/31/2014 Discharge date: 04/04/2014  Admission Diagnoses: Subtalar arthritis  Discharge Diagnoses: same Active Problems:   HYPERTENSION   ATRIAL FIBRILLATION, PAROXYSMAL   REACTIVE AIRWAY DISEASE   Nonunion of subtalar arthrodesis   Discharged Condition: good  Hospital Course: Pt brought to [facility] for removal of deep implants and revision of subtalar arthrodesis. Surgical procedure performed by Dr. Doran Durand with no complications.  Pt was recovered in PACU and then transferred to the floor for further post operative care.  During her stay pt underwent anticoagulation treatment with Xarelto 20mg  as well as physical therapy and occupational therapy for nursing placement.  On 04/02/2014 pt had COPD exacerbation which required the use of bronchodilators and prednisone for treatment.  Pt recovered well from shortness of breath associated with COPD and other vital signs were stable.  On 02/04/2014 pt was appropriate for discharge, prognosis for the pt is good and she will f/u with Dr Doran Durand in 2 weeks.  Consults: Dr. Conley Canal with [facility]   Significant Diagnostic Studies: none  Treatments: respiratory therapy: albuterol/atropine nebulizer and prednisone  Discharge Exam: Blood pressure 144/64, pulse 72, temperature 97.7 F (36.5 C), temperature source Oral, resp. rate 18, height 4' 11.84" (1.52 m), weight 69.9 kg (154 lb 1.6 oz), SpO2 95.00%. WD WN NAD A/Ox3, appears stated age.  Mood and affect normal, respirations unlabored EOMI.  Cast C/D/I, well placed and in good repair. Toes Mobile and well perfused, cap refill <2sec. Normal sensation to light touch intact.   Disposition: 03-Skilled Nursing Facility  Discharge Orders   Future Appointments Provider Department Dept Phone   05/17/2014 9:00 AM Chipper Herb, MD [place] [phone]   07/06/2014 8:00 AM [facility] [phone]   07/06/2014 8:30 AM [facility] [phone]   Future Orders Complete By Expires   Call MD / Call 911  As directed    Constipation Prevention  As directed    Diet - low sodium heart healthy  As directed    Driving restrictions  As directed    Increase activity slowly as tolerated  As directed    Lifting restrictions  As directed        Medication List         acetaminophen 325 MG tablet  Commonly known as:  TYLENOL  Take 650 mg by mouth every 6 (six) hours as needed. For pain/fever     albuterol (2.5 MG/3ML) 0.083% nebulizer solution  Commonly known as:  PROVENTIL  Take 2.5 mg by nebulization every 6 (six) hours as needed for wheezing or shortness of breath.     albuterol 108 (90 BASE) MCG/ACT inhaler  Commonly known as:  PROVENTIL HFA;VENTOLIN HFA  Inhale 2 puffs into the lungs every 6 (six) hours as needed for wheezing.     ALPRAZolam 0.5 MG tablet  Commonly known as:  XANAX  TAKE 2 TABLETS AT BEDTIME AS NEEDED     CALCIUM 600+D 600-400 MG-UNIT per tablet  Generic drug:  Calcium Carbonate-Vitamin D  Take 1 tablet by mouth daily at 6 PM.     cholecalciferol 1000 UNITS tablet  Commonly known as:  VITAMIN D  Take 1,000 Units by mouth 2 (two) times daily with breakfast and lunch.     cyclobenzaprine 10 MG tablet  Commonly known as:  FLEXERIL  Take 10 mg by mouth  3 (three) times daily as needed for muscle spasms.     denosumab 60 MG/ML Soln injection  Commonly known as:  PROLIA  Inject 60 mg into the skin every 6 (six) months. Administer in upper arm, thigh, or abdomen     dextromethorphan-guaiFENesin 30-600 MG per 12 hr tablet  Commonly known as:  MUCINEX DM  Take 1 tablet by mouth every 12 (twelve) hours. scheduled     dicyclomine 10 MG capsule  Commonly known as:  BENTYL  Take 10-20 mg by mouth every 6 (six) hours as needed for  spasms. 10-20mg  tablets depending on how bad the spasms are.     fluticasone 50 MCG/ACT nasal spray  Commonly known as:  FLONASE  Place 2 sprays into both nostrils daily.     isosorbide mononitrate 60 MG 24 hr tablet  Commonly known as:  IMDUR  Take 60 mg by mouth at bedtime.     levothyroxine 25 MCG tablet  Commonly known as:  SYNTHROID, LEVOTHROID  Take 50 mcg by mouth daily before breakfast.     loratadine 10 MG tablet  Commonly known as:  CLARITIN  Take 10 mg by mouth 2 (two) times daily.     metoprolol 50 MG tablet  Commonly known as:  LOPRESSOR  Take 50 mg by mouth 2 (two) times daily.     montelukast 10 MG tablet  Commonly known as:  SINGULAIR  Take 10 mg by mouth at bedtime.     multivitamin with minerals Tabs tablet  Take 1 tablet by mouth daily.     nitroGLYCERIN 0.4 MG SL tablet  Commonly known as:  NITROSTAT  Place 0.4 mg under the tongue every 5 (five) minutes as needed. For chest pain     omega-3 acid ethyl esters 1 G capsule  Commonly known as:  LOVAZA  Take 2 g by mouth 2 (two) times daily.     omeprazole-sodium bicarbonate 40-1100 MG per capsule  Commonly known as:  ZEGERID  Take 1 capsule by mouth daily before breakfast.     ondansetron 4 MG tablet  Commonly known as:  ZOFRAN  Take 4 mg by mouth every 8 (eight) hours as needed for nausea or vomiting.     oxyCODONE 5 MG immediate release tablet  Commonly known as:  Oxy IR/ROXICODONE  Take 1-2 tablets (5-10 mg total) by mouth every 4 (four) hours as needed for moderate pain or severe pain.     predniSONE 1 MG tablet  Commonly known as:  DELTASONE  Take 10 tablets (10 mg total) by mouth daily with breakfast.     predniSONE 1 MG tablet  Commonly known as:  DELTASONE  Take 10 tablets (10 mg total) by mouth daily with breakfast.     rivaroxaban 20 MG Tabs tablet  Commonly known as:  XARELTO  Take 1 tablet (20 mg total) by mouth daily with supper.  Start taking on:  04/05/2014     rosuvastatin  20 MG tablet  Commonly known as:  CRESTOR  Take 20 mg by mouth at bedtime.     sertraline 100 MG tablet  Commonly known as:  ZOLOFT  Take 100 mg by mouth at bedtime.     traMADol 50 MG tablet  Commonly known as:  ULTRAM  Take 50 mg by mouth every 6 (six) hours as needed for moderate pain.     VITAMIN C PO  Take 1 tablet by mouth every morning.     VITAMIN E PO  Take 1 capsule by mouth every morning.           Follow-up Information   Follow up with HEWITT, Jenny Reichmann, MD. Schedule an appointment as soon as possible for a visit in 2 weeks. (For suture removal)    Specialty:  Orthopedic Surgery   Contact information:   [address] [place] [address] [phone]       Signed: Trula Ore 04/04/2014, 2:13 PM

## 2014-04-06 ENCOUNTER — Non-Acute Institutional Stay (SKILLED_NURSING_FACILITY): Payer: PRIVATE HEALTH INSURANCE | Admitting: Internal Medicine

## 2014-04-06 DIAGNOSIS — E785 Hyperlipidemia, unspecified: Secondary | ICD-10-CM

## 2014-04-06 DIAGNOSIS — Z981 Arthrodesis status: Secondary | ICD-10-CM

## 2014-04-06 DIAGNOSIS — K219 Gastro-esophageal reflux disease without esophagitis: Secondary | ICD-10-CM

## 2014-04-06 DIAGNOSIS — J45901 Unspecified asthma with (acute) exacerbation: Secondary | ICD-10-CM

## 2014-04-07 ENCOUNTER — Other Ambulatory Visit: Payer: Self-pay | Admitting: *Deleted

## 2014-04-07 MED ORDER — OXYCODONE HCL 5 MG PO TABS: ORAL_TABLET | ORAL | Status: DC

## 2014-04-07 NOTE — Telephone Encounter (Signed)
[facility] 

## 2014-04-11 NOTE — Progress Notes (Addendum)
Patient ID: [name], female   DOB: 06/17/1936, 78 y.o.   MRN: [id number]                  HISTORY & PHYSICAL  DATE:  04/06/2014     FACILITY: [facility]    LEVEL OF CARE:   SNF   CHIEF COMPLAINT:  Admission to SNF, post stay at [facility], 03/31/2014 through 04/04/2014.    HISTORY OF PRESENT ILLNESS:  This is a patient who was brought to [facility] electively for removal and revision of subtalar arthrodesis.  She had previously had a right ankle fusion two years ago, at which time she was in this building for rehab.   Apparently, the patient tells me she had persistent foot and ankle pain and was brought to the OR this time for revision of the original procedure.  She had removal of implants and revision of subtalar arthrodesis.  She transferred to the floor for postoperative care.  She was put on Xarelto for DVT prophylaxis.    On 04/02/2014, she had an exacerbation of her "COPD", according to her discharge summary.  She received bronchodilators and prednisone.  She recovered.    The patient tells me that she has previously seen Dr. Dagmar Hait at [facility].  As far as she understands, her diagnosis is that of asthma.  She had a lot of problems a year ago right around this time with cough and shortness of breath.  She would get better with antibiotics and steroids, only to relapse again.  She uses ProAir inhalers at home.  According to her, her Symbicort was stopped as it was felt that this medication may actually be aggravating her condition.    PAST MEDICAL HISTORY/PROBLEM LIST:    Hypertension.    Atrial fibrillation, paroxysmal.    Reactive airway disease.    Nonunion of subtalar arthrodesis.    CURRENT MEDICATIONS:  Medication list is reviewed.    Albuterol nebulizers 2.5 every 6 hours as needed.    Proventil inhaler 2 puffs every 6 hours as needed.    Xanax 0.5, 2 tablets at bedtime.    Os-Cal plus D 600, 1 tablet by mouth daily at 6 p.m.    Flexeril 10  mg three times daily.    Prolia 60 mg every six months.    Mucinex DM 1 tablet by mouth every 12 hours.    Bentyl 10 mg every 6 hours for spasms.     Flonase 2 sprays into both nostrils daily.    Imdur 60 q.d., presumably for coronary artery disease.    Synthroid 25 q.d., presumably hypothyroidism.    Metoprolol 50 mg b.i.d.    Singulair 10 mg daily.    Nitroglycerin 0.4 sublingually p.r.n. chest pain.         Omeprazole/sodium bicarb (Zegerid) 1 capsule by mouth before breakfast.    Prednisone 10 mg daily with breakfast.      Xarelto 20 mg daily.    Crestor 20 mg daily, presumably hyperlipidemia.    Zoloft 100 mg daily, presumably depression.    SOCIAL HISTORY:   HOUSING:  The patient lives in [place].   FUNCTIONAL STATUS:  Independent with her ADLs and IADLs, although she requires some assistance for transport.  She has a nebulizer at home.    REVIEW OF SYSTEMS:   CHEST/RESPIRATORY:  Still cough and shortness of breath, nonproductive.   CARDIAC:   No chest pain.   GI:  No nausea,  vomiting  or abdominal pain.   GU:  She is complaining of some dysuria as she took some over-the-counter urinary analgesics that she has.  We will check a urine culture.    PHYSICAL EXAMINATION:   GENERAL APPEARANCE:   Pleasant, alert woman in no distress.   HEENT:   MOUTH/THROAT:   No oral lesions seen.   CHEST/RESPIRATORY:  Upper airway sounds.  Prolonged expiratory phase with expiratory wheezing.  There may be some pseudo-wheezing, as well.  There is no accessory muscle use and no signs of respiratory distress.    CARDIOVASCULAR:  CARDIAC:   Heart sounds are normal.  There are no murmurs.   GASTROINTESTINAL:  LIVER/SPLEEN/KIDNEYS:  No liver, no spleen.  No tenderness.   CIRCULATION:  EDEMA/VARICOSITIES:  Extremities:  Right leg is wrapped to the knee in a [place] based wrap.  Left leg looks fine.  There is no evidence of a DVT.    ASSESSMENT/PLAN:  Status post right ankle surgery as  described.  She is on Xarelto for DVT prophylaxis, although I wonder about the atrial fibrillation.    Chronic asthma with an acute exacerbation.  Per her description, asthma is the correct diagnosis.  She says she has had breathing tests.   I will try to review [facility]'s notes.  She is not on chronic prednisone but I am going to keep her on this, 10 mg for now.  She has asked for her nebulizers and I will put her on these routinely for now.    Apparently severe gastroesophageal reflux, which may be aggravating her asthmatic symptoms.    History of depression with anxiety.    Coronary artery disease.  No evidence that this is currently active.  History of afib  Hyperlipidemia.  On Crestor.    Notable that she is not on chronic prednisone, but I am going to maintain this for now.  She also, I do not believe, has been on Xarelto so she may not need this when she leaves.

## 2014-04-18 ENCOUNTER — Other Ambulatory Visit: Payer: Self-pay | Admitting: Nurse Practitioner

## 2014-04-19 NOTE — Telephone Encounter (Signed)
This is okay to refill 

## 2014-04-19 NOTE — Telephone Encounter (Signed)
Last seen in office on 01-13-14. Rx last filled on 03-22-14 for #[id number]. Please advise. If approved please route to Pool A so nurse can call in to pharmacy

## 2014-04-20 NOTE — Telephone Encounter (Signed)
Called refill in to pharmacy    

## 2014-04-27 ENCOUNTER — Non-Acute Institutional Stay (SKILLED_NURSING_FACILITY): Payer: PRIVATE HEALTH INSURANCE | Admitting: Internal Medicine

## 2014-04-27 DIAGNOSIS — J45901 Unspecified asthma with (acute) exacerbation: Secondary | ICD-10-CM

## 2014-04-27 NOTE — Progress Notes (Signed)
Patient ID: MAYSIE PARKHILL, female   DOB: 03-May-1936, 78 y.o.   MRN: [id number] Facility; [facility] Chief complaint cough wheezing shortness of breath   History; Mr. Hervey Ard is a 78 year old woman who came here for rehabilitation after revision of a subtalar arthrodesis. This is an elective procedure. While she was hospitalized she had an exacerbation of COPD which required the use of bronchodilators and prednisone. She rides in the building on albuterol nebulizers 2.5 every 6 when necessary, Mucinex DM one tablet every 12 hours, Flonase 2 sprays into both nostrils daily, Singulair 10 mg daily, prednisone 10 mg daily. Over the weekend and she started to develop a hacking cough productive of a minimal amount of yellow sputum. She has not been running a fever. Chest x-ray showed clear lungs with no acute process, this obviously did not include a lateral view. Over the phone yesterday I gave her 40 mg of prednisone. She has been receiving her nebulizers when necessary.  Review of systems Respiratory; essentially as above Cardiac no clear exertional chest pain GI patient states she has significant gastroesophageal reflux  Physical examination Gen. patient has a barking cough however she is talking in full sentences and not overtly struggling to breathe  vitals; blood pressure 158/78-temperature 96.2-pulse 86-respirations 22-O2 sat is 96% on room air Resp; her air entry is shallow with a prolonged expiratory phase and expiratory wheezing. There may be a component of pseudo-wheezing here however there is no stridor Cardiac heart sounds are distant however there is no murmurs or gallops Abdomen no liver no spleen no tenderness Extremities no evidence for DVT  Impression/plan #1 asthmatic /asthma acute; she has been seen by [facility] in the past I look through the notes I wasn't really able to clarify the diagnosis. She clearly has a reactive airway component whether there is underlying  chronic airway obstruction here I am unclear. She quit smoking in 1990, inhaled steroids were stopped at some point out of fear that this actually may have been exacerbating her symptoms. At present I am going to give her a shot of Solu-Medrol, begin her on prednisone 60 mg starting tomorrow with the generally slow taper. Also start her on antibiotic probably a quinolone unless they're allergy issues here. I am going to maintain the routine and frequent nebulizers. To this point I think she is safe to remain in the facility however the staff monitor her vital signs every shift

## 2014-04-29 ENCOUNTER — Emergency Department (HOSPITAL_COMMUNITY): Payer: PRIVATE HEALTH INSURANCE

## 2014-04-29 ENCOUNTER — Inpatient Hospital Stay (HOSPITAL_COMMUNITY)
Admission: EM | Admit: 2014-04-29 | Discharge: 2014-05-14 | DRG: 163 | Disposition: A | Discharge status: Skilled Nursing Facility | Payer: PRIVATE HEALTH INSURANCE | Attending: Internal Medicine | Admitting: Internal Medicine

## 2014-04-29 ENCOUNTER — Encounter (HOSPITAL_COMMUNITY): Payer: Self-pay | Admitting: Emergency Medicine

## 2014-04-29 DIAGNOSIS — R079 Chest pain, unspecified: Secondary | ICD-10-CM

## 2014-04-29 DIAGNOSIS — J45902 Unspecified asthma with status asthmaticus: Secondary | ICD-10-CM

## 2014-04-29 DIAGNOSIS — I509 Heart failure, unspecified: Secondary | ICD-10-CM

## 2014-04-29 DIAGNOSIS — E860 Dehydration: Secondary | ICD-10-CM

## 2014-04-29 DIAGNOSIS — Z882 Allergy status to sulfonamides status: Secondary | ICD-10-CM

## 2014-04-29 DIAGNOSIS — E039 Hypothyroidism, unspecified: Secondary | ICD-10-CM

## 2014-04-29 DIAGNOSIS — M810 Age-related osteoporosis without current pathological fracture: Secondary | ICD-10-CM

## 2014-04-29 DIAGNOSIS — A4902 Methicillin resistant Staphylococcus aureus infection, unspecified site: Secondary | ICD-10-CM | POA: Diagnosis present

## 2014-04-29 DIAGNOSIS — E871 Hypo-osmolality and hyponatremia: Secondary | ICD-10-CM

## 2014-04-29 DIAGNOSIS — F411 Generalized anxiety disorder: Secondary | ICD-10-CM | POA: Diagnosis present

## 2014-04-29 DIAGNOSIS — Z886 Allergy status to analgesic agent status: Secondary | ICD-10-CM

## 2014-04-29 DIAGNOSIS — I4892 Unspecified atrial flutter: Secondary | ICD-10-CM

## 2014-04-29 DIAGNOSIS — I252 Old myocardial infarction: Secondary | ICD-10-CM

## 2014-04-29 DIAGNOSIS — J189 Pneumonia, unspecified organism: Secondary | ICD-10-CM | POA: Diagnosis present

## 2014-04-29 DIAGNOSIS — J942 Hemothorax: Secondary | ICD-10-CM

## 2014-04-29 DIAGNOSIS — J383 Other diseases of vocal cords: Secondary | ICD-10-CM | POA: Diagnosis present

## 2014-04-29 DIAGNOSIS — Z9861 Coronary angioplasty status: Secondary | ICD-10-CM

## 2014-04-29 DIAGNOSIS — M960 Pseudarthrosis after fusion or arthrodesis: Secondary | ICD-10-CM

## 2014-04-29 DIAGNOSIS — I2510 Atherosclerotic heart disease of native coronary artery without angina pectoris: Secondary | ICD-10-CM

## 2014-04-29 DIAGNOSIS — Z836 Family history of other diseases of the respiratory system: Secondary | ICD-10-CM

## 2014-04-29 DIAGNOSIS — J45909 Unspecified asthma, uncomplicated: Secondary | ICD-10-CM

## 2014-04-29 DIAGNOSIS — K222 Esophageal obstruction: Secondary | ICD-10-CM

## 2014-04-29 DIAGNOSIS — E559 Vitamin D deficiency, unspecified: Secondary | ICD-10-CM | POA: Diagnosis present

## 2014-04-29 DIAGNOSIS — J90 Pleural effusion, not elsewhere classified: Secondary | ICD-10-CM

## 2014-04-29 DIAGNOSIS — Z825 Family history of asthma and other chronic lower respiratory diseases: Secondary | ICD-10-CM

## 2014-04-29 DIAGNOSIS — Z79899 Other long term (current) drug therapy: Secondary | ICD-10-CM

## 2014-04-29 DIAGNOSIS — Z881 Allergy status to other antibiotic agents status: Secondary | ICD-10-CM

## 2014-04-29 DIAGNOSIS — Z96659 Presence of unspecified artificial knee joint: Secondary | ICD-10-CM

## 2014-04-29 DIAGNOSIS — I110 Hypertensive heart disease with heart failure: Secondary | ICD-10-CM | POA: Diagnosis present

## 2014-04-29 DIAGNOSIS — F3289 Other specified depressive episodes: Secondary | ICD-10-CM

## 2014-04-29 DIAGNOSIS — I4891 Unspecified atrial fibrillation: Secondary | ICD-10-CM

## 2014-04-29 DIAGNOSIS — M797 Fibromyalgia: Secondary | ICD-10-CM

## 2014-04-29 DIAGNOSIS — Z7901 Long term (current) use of anticoagulants: Secondary | ICD-10-CM

## 2014-04-29 DIAGNOSIS — J869 Pyothorax without fistula: Secondary | ICD-10-CM

## 2014-04-29 DIAGNOSIS — J9601 Acute respiratory failure with hypoxia: Secondary | ICD-10-CM

## 2014-04-29 DIAGNOSIS — I10 Essential (primary) hypertension: Secondary | ICD-10-CM

## 2014-04-29 DIAGNOSIS — Z880 Allergy status to penicillin: Secondary | ICD-10-CM

## 2014-04-29 DIAGNOSIS — J449 Chronic obstructive pulmonary disease, unspecified: Secondary | ICD-10-CM | POA: Diagnosis present

## 2014-04-29 DIAGNOSIS — Z8249 Family history of ischemic heart disease and other diseases of the circulatory system: Secondary | ICD-10-CM

## 2014-04-29 DIAGNOSIS — E785 Hyperlipidemia, unspecified: Secondary | ICD-10-CM

## 2014-04-29 DIAGNOSIS — I480 Paroxysmal atrial fibrillation: Secondary | ICD-10-CM | POA: Diagnosis present

## 2014-04-29 DIAGNOSIS — IMO0001 Reserved for inherently not codable concepts without codable children: Secondary | ICD-10-CM | POA: Diagnosis present

## 2014-04-29 DIAGNOSIS — Z888 Allergy status to other drugs, medicaments and biological substances status: Secondary | ICD-10-CM

## 2014-04-29 DIAGNOSIS — Z8042 Family history of malignant neoplasm of prostate: Secondary | ICD-10-CM

## 2014-04-29 DIAGNOSIS — IMO0002 Reserved for concepts with insufficient information to code with codable children: Secondary | ICD-10-CM | POA: Diagnosis present

## 2014-04-29 DIAGNOSIS — Z87311 Personal history of (healed) other pathological fracture: Secondary | ICD-10-CM

## 2014-04-29 DIAGNOSIS — J9600 Acute respiratory failure, unspecified whether with hypoxia or hypercapnia: Principal | ICD-10-CM

## 2014-04-29 DIAGNOSIS — K219 Gastro-esophageal reflux disease without esophagitis: Secondary | ICD-10-CM

## 2014-04-29 DIAGNOSIS — K224 Dyskinesia of esophagus: Secondary | ICD-10-CM

## 2014-04-29 DIAGNOSIS — F329 Major depressive disorder, single episode, unspecified: Secondary | ICD-10-CM | POA: Diagnosis present

## 2014-04-29 DIAGNOSIS — Z87891 Personal history of nicotine dependence: Secondary | ICD-10-CM

## 2014-04-29 DIAGNOSIS — K449 Diaphragmatic hernia without obstruction or gangrene: Secondary | ICD-10-CM

## 2014-04-29 LAB — CBC WITH DIFFERENTIAL/PLATELET
Basophils Absolute: 0 10*3/uL (ref 0.0–0.1)
Basophils Relative: 0 % (ref 0–1)
Eosinophils Absolute: 0 10*3/uL (ref 0.0–0.7)
Eosinophils Relative: 0 % (ref 0–5)
HCT: 35.4 % — ABNORMAL LOW (ref 36.0–46.0)
Hemoglobin: 12.9 g/dL (ref 12.0–15.0)
Lymphocytes Relative: 4 % — ABNORMAL LOW (ref 12–46)
Lymphs Abs: 1.1 10*3/uL (ref 0.7–4.0)
MCH: 34 pg (ref 26.0–34.0)
MCHC: 36.4 g/dL — ABNORMAL HIGH (ref 30.0–36.0)
MCV: 93.4 fL (ref 78.0–100.0)
Monocytes Absolute: 1.6 10*3/uL — ABNORMAL HIGH (ref 0.1–1.0)
Monocytes Relative: 6 % (ref 3–12)
Neutro Abs: 22.5 10*3/uL — ABNORMAL HIGH (ref 1.7–7.7)
Neutrophils Relative %: 89 % — ABNORMAL HIGH (ref 43–77)
Platelets: 220 10*3/uL (ref 150–400)
RBC: 3.79 MIL/uL — ABNORMAL LOW (ref 3.87–5.11)
RDW: 13.8 % (ref 11.5–15.5)
WBC: 25.2 10*3/uL — ABNORMAL HIGH (ref 4.0–10.5)

## 2014-04-29 LAB — BASIC METABOLIC PANEL
BUN: 17 mg/dL (ref 6–23)
CO2: 24 mEq/L (ref 19–32)
Calcium: 8.7 mg/dL (ref 8.4–10.5)
Chloride: 86 mEq/L — ABNORMAL LOW (ref 96–112)
Creatinine, Ser: 0.59 mg/dL (ref 0.50–1.10)
GFR calc Af Amer: >90 mL/min (ref >90)
GFR calc non Af Amer: 87 mL/min — ABNORMAL LOW (ref >90)
Glucose, Bld: 133 mg/dL — ABNORMAL HIGH (ref 70–99)
Potassium: 4.1 mEq/L (ref 3.7–5.3)
Sodium: 126 mEq/L — ABNORMAL LOW (ref 137–147)

## 2014-04-29 MED ORDER — FUROSEMIDE 10 MG/ML IJ SOLN
40.0000 mg | Freq: Once | INTRAMUSCULAR | Status: AC
  Administered 2014-04-30: 40 mg via INTRAVENOUS
  Filled 2014-04-29: qty 4

## 2014-04-29 MED ORDER — NITROGLYCERIN 2 % TD OINT
0.5000 [in_us] | TOPICAL_OINTMENT | Freq: Once | TRANSDERMAL | Status: AC
  Administered 2014-04-30: 0.5 [in_us] via TOPICAL
  Filled 2014-04-29: qty 1

## 2014-04-29 MED ORDER — ALBUTEROL (5 MG/ML) CONTINUOUS INHALATION SOLN
10.0000 mg/h | INHALATION_SOLUTION | Freq: Once | RESPIRATORY_TRACT | Status: AC
  Administered 2014-04-29: 10 mg/h via RESPIRATORY_TRACT
  Filled 2014-04-29: qty 20

## 2014-04-29 NOTE — ED Provider Notes (Signed)
CSN: [id number]     Arrival date & time 04/29/14  2219 History   First MD Initiated Contact with Patient 04/29/14 2310    This chart was scribed for Sharyon Cable, MD by Terressa Koyanagi, ED Scribe. This patient was seen in room APA14/APA14 and the patient's care was started at 11:12 PM.  Chief Complaint  Patient presents with  . Shortness of Breath    sob for for 4 days. getting worse everyday.   . Cough    coughing up thick sputum per ems. no coughing noted upon arrival to er   Patient is a 78 y.o. female presenting with cough. The history is provided by the patient. No language interpreter was used.  Cough Associated symptoms: shortness of breath    HPI Comments:  [name] is a 78 y.o. female, with a history of CAD, GERD, chronic bronchitis, AFib, HLD, and esophageal motility disorder, brought in by ambulance, who presents to the Emergency Department complaining of  SOB with associated cough and chest tightness onset 4 days ago. PT reports she is using a nebulizer at home for her symptoms without much relief. Pt denies being on O2 at home. Pt reports that she has a cast on her right foot due to arthritis. Pt reports she was given two breathing treatments by EMS but still feel short of breath.  She feels she is worsening  Past Medical History  Diagnosis Date  . GERD (gastroesophageal reflux disease)   . Anxiety disorder   . Arthritis   . CAD (coronary artery disease)     Cath May 2010.  Nonbstructive  . Depression   . Hypothyroid   . Asthmatic bronchitis   . Hypertension     dr Percival Spanish  . OA (osteoarthritis)   . Chronic bronchitis   . 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 Encephalitis     d/t meningitis  . PONV (postoperative nausea and vomiting)     history of cardiac arrest day 1 post surgery  in 2008  . Esophageal motility disorder   . Atrial fibrillation   . Hyperlipidemia   . Fibromyalgia   . Vitamin D deficiency   . Status post  dilation of esophageal narrowing   . Bowel obstruction     blockage   Past Surgical History  Procedure Laterality Date  . Total knee arthroplasty Right   . Back surgery    . Coronary angioplasty with stent placement  2003  . Sinus surgery with instatrak    . Knee arthroscopy  03/26/2012    Procedure: ARTHROSCOPY KNEE;  Surgeon: Wylene Simmer, MD;  Location: [place];  Service: Orthopedics;  Laterality: Left;  with Debridement of Lateral Meniscus tear  . Rotator cuff repair Bilateral   . Ankle fusion  08/27/2012    Procedure: ARTHRODESIS ANKLE;  Surgeon: Wylene Simmer, MD;  Location: [place];  Service: Orthopedics;  Laterality: Right;  Arthrodesis right ankle and subtalar joint  . Foot arthrodesis, subtalar Right 2013  . Removal of implant Right 03/31/2014    DR HEWITT  . Hardware revision  03/31/2014    SUBTALOR  ARTHRODESIS       DR HEWITT  . Hardware removal Right 03/31/2014    Procedure: REMOVAL OF DEEP IMPLANTS X 3  RIGHT ;  Surgeon: Wylene Simmer, MD;  Location: [place];  Service: Orthopedics;  Laterality: Right;  . Arthrodesis tibiofibular Right 03/31/2014    Procedure: REVISION OF SUBTALOR ARTHRODESIS  RIGHT ;  Surgeon: Wylene Simmer, MD;  Location: [place];  Service: Orthopedics;  Laterality: Right;   Family History  Problem Relation Age of Onset  . Prostate cancer Brother   . Heart disease Mother   . Emphysema Sister   . Asthma Mother    History  Substance Use Topics  . Smoking status: Former Smoker -- 1.00 packs/day for 15 years    Types: Cigarettes    Quit date: 12/16/1989  . Smokeless tobacco: Never Used  . Alcohol Use: No   OB History   Grav Para Term Preterm Abortions TAB SAB Ect Mult Living                 Review of Systems  Respiratory: Positive for cough, chest tightness and shortness of breath.   All other systems reviewed and are negative.     Allergies  Aspirin; Captopril; Naproxen; Penicillins; Sulfonamide derivatives; and Clindamycin/lincomycin  Home  Medications   Prior to Admission medications   Medication Sig Start Date End Date Taking? Authorizing Provider  acetaminophen (TYLENOL) 325 MG tablet Take 650 mg by mouth every 6 (six) hours as needed. For pain/fever   Yes Historical Provider, MD  albuterol (PROVENTIL HFA;VENTOLIN HFA) 108 (90 BASE) MCG/ACT inhaler Inhale 2 puffs into the lungs every 6 (six) hours as needed for wheezing. 09/06/13  Yes Chipper Herb, MD  ALPRAZolam Duanne Moron) 0.5 MG tablet Take 1 mg by mouth at bedtime as needed for anxiety.   Yes Historical Provider, MD  Ascorbic Acid (VITAMIN C PO) Take 1 tablet by mouth every morning.   Yes Historical Provider, MD  Calcium Carbonate-Vitamin D (CALCIUM 600+D) 600-400 MG-UNIT per tablet Take 1 tablet by mouth daily at 6 PM.    Yes Historical Provider, MD  cholecalciferol (VITAMIN D) 1000 UNITS tablet Take 1,000 Units by mouth 2 (two) times daily with breakfast and lunch.    Yes Historical Provider, MD  cyclobenzaprine (FLEXERIL) 10 MG tablet Take 10 mg by mouth 3 (three) times daily as needed for muscle spasms.   Yes Historical Provider, MD  dextromethorphan-guaiFENesin (MUCINEX DM) 30-600 MG per 12 hr tablet Take 1 tablet by mouth every 12 (twelve) hours. scheduled   Yes Historical Provider, MD  dicyclomine (BENTYL) 10 MG capsule Take 10-20 mg by mouth every 6 (six) hours as needed for spasms. 10-20mg  tablets depending on how bad the spasms are.   Yes Historical Provider, MD  fluticasone (FLONASE) 50 MCG/ACT nasal spray Place 2 sprays into both nostrils daily.   Yes Historical Provider, MD  isosorbide mononitrate (IMDUR) 60 MG 24 hr tablet Take 60 mg by mouth at bedtime.   Yes Historical Provider, MD  levofloxacin (LEVAQUIN) 500 MG tablet Take 500 mg by mouth daily. 7 day course starting on 04/28/2014   Yes Historical Provider, MD  levothyroxine (SYNTHROID, LEVOTHROID) 25 MCG tablet Take 50 mcg by mouth daily before breakfast.   Yes Historical Provider, MD  loratadine (CLARITIN) 10 MG  tablet Take 10 mg by mouth 2 (two) times daily.   Yes Historical Provider, MD  methylPREDNISolone sodium succinate (SOLU-MEDROL) 40 mg/mL injection Inject 80 mg into the vein once.   Yes Historical Provider, MD  metoprolol (LOPRESSOR) 50 MG tablet Take 50 mg by mouth 2 (two) times daily.   Yes Historical Provider, MD  montelukast (SINGULAIR) 10 MG tablet Take 10 mg by mouth at bedtime.   Yes Historical Provider, MD  Multiple Vitamin (MULTIVITAMIN WITH MINERALS) TABS Take 1 tablet by mouth daily.  Yes Historical Provider, MD  omega-3 acid ethyl esters (LOVAZA) 1 G capsule Take 2 g by mouth 2 (two) times daily.   Yes Historical Provider, MD  omeprazole-sodium bicarbonate (ZEGERID) 40-1100 MG per capsule Take 1 capsule by mouth daily before breakfast.   Yes Historical Provider, MD  ondansetron (ZOFRAN) 4 MG tablet Take 4 mg by mouth every 8 (eight) hours as needed for nausea or vomiting.   Yes Historical Provider, MD  oxyCODONE (OXY IR/ROXICODONE) 5 MG immediate release tablet Take one tablet by mouth every 4 hours as needed for moderate to severe pain 04/07/14  Yes Pricilla Larsson, NP  predniSONE (DELTASONE) 20 MG tablet Take 60 mg by mouth daily. 3 day course starting on 04/28/2014   Yes Historical Provider, MD  rivaroxaban (XARELTO) 20 MG TABS tablet Take 1 tablet (20 mg total) by mouth daily with supper. 04/05/14  Yes Wylene Simmer, MD  rosuvastatin (CRESTOR) 20 MG tablet Take 20 mg by mouth at bedtime.   Yes Historical Provider, MD  sertraline (ZOLOFT) 100 MG tablet Take 100 mg by mouth at bedtime.   Yes Historical Provider, MD  traMADol (ULTRAM) 50 MG tablet Take 50 mg by mouth every 6 (six) hours as needed for moderate pain.  03/04/14  Yes Historical Provider, MD  VITAMIN E PO Take 1 capsule by mouth every morning.   Yes Historical Provider, MD  albuterol (PROVENTIL) (2.5 MG/3ML) 0.083% nebulizer solution Take 2.5 mg by nebulization every 6 (six) hours as needed for wheezing or shortness of breath.     Historical Provider, MD  denosumab (PROLIA) 60 MG/ML SOLN injection Inject 60 mg into the skin every 6 (six) months. Administer in upper arm, thigh, or abdomen    Historical Provider, MD  nitroGLYCERIN (NITROSTAT) 0.4 MG SL tablet Place 0.4 mg under the tongue every 5 (five) minutes as needed. For chest pain    Historical Provider, MD   Triage Vitals: BP 149/76  Pulse 97  Temp(Src) 99.1 F (37.3 C) (Oral)  Ht 5\' 1"  (1.549 m)  Wt 153 lb (69.4 kg)  BMI 28.92 kg/m2  SpO2 96% BP 171/82  Pulse 97  Temp(Src) 99.1 F (37.3 C) (Oral)  Resp 20  Ht 5\' 1"  (1.549 m)  Wt 153 lb (69.4 kg)  BMI 28.92 kg/m2  SpO2 95%  Physical Exam CONSTITUTIONAL: Well developed/well nourished, ill appearing HEAD: Normocephalic/atraumatic EYES: EOMI/PERRL ENMT: Mucous membranes moist NECK: supple no meningeal signs SPINE:entire spine nontender CV: tachycardic and irregular LUNGS: Lungs are clear to auscultation bilaterally, no apparent distress, wheezing bilaterally with tachypnea ABDOMEN: soft, nontender, no rebound or guarding GU:no cva tenderness NEURO: Pt is awake/alert, moves all extremitiesx4 EXTREMITIES: pulses normal, full ROM, cast to right foot SKIN: warm, color normal PSYCH: no abnormalities of mood noted  ED Course  Procedures   CRITICAL CARE Performed by: Sharyon Cable Total critical care time: 33 Critical care time was exclusive of separately billable procedures and treating other patients. Critical care was necessary to treat or prevent imminent or life-threatening deterioration. Critical care was time spent personally by me on the following activities: development of treatment plan with patient and/or surrogate as well as nursing, discussions with consultants, evaluation of patient's response to treatment, examination of patient, obtaining history from patient or surrogate, ordering and performing treatments and interventions, ordering and review of laboratory studies, ordering and  review of radiographic studies, pulse oximetry and re-evaluation of patient's condition.  DIAGNOSTIC STUDIES: Oxygen Saturation is 96% on RA, adequate by my  interpretation.    COORDINATION OF CARE:  11:17 PM-Discussed treatment plan which includes breathing treatment, EKG, imaging with pt at bedside and pt agreed to plan.   11:52 PM: Recheck- Discussed discontinuing breathing treatment and hospital admittance with pt, pt agreed to plan.  Pt noted to be in CHF.  She also has pleural effusion.  Nebs were stopped and will now treat CHF with nitrates as well as lasix.  Pt is also have atrial flutter with RVR but will first treat CHF and defer rate control for now Pt is ill and will require stepdown monitoring D/w dr Collier Salina LE, will admit to stepdown  Labs Review Labs Reviewed  CBC WITH DIFFERENTIAL - Abnormal; Notable for the following:    WBC 25.2 (*)    RBC 3.79 (*)    HCT 35.4 (*)    MCHC 36.4 (*)    Neutrophils Relative % 89 (*)    Neutro Abs 22.5 (*)    Lymphocytes Relative 4 (*)    Monocytes Absolute 1.6 (*)    All other components within normal limits  BASIC METABOLIC PANEL - Abnormal; Notable for the following:    Sodium 126 (*)    Chloride 86 (*)    Glucose, Bld 133 (*)    GFR calc non Af Amer 87 (*)    All other components within normal limits  PRO B NATRIURETIC PEPTIDE    Imaging Review Dg Chest Port 1 View  04/29/2014   CLINICAL DATA:  Shortness of breath and cough with history of bronchitis  EXAM: PORTABLE CHEST - 1 VIEW  COMPARISON:  DG CHEST 1V PORT dated 04/01/2014  FINDINGS: There is new increased density in the right hemi thorax consistent with a pleural effusion. The hemidiaphragm is obscured. On the left the lung is well-expanded. The interstitial markings are mildly increased. The cardiac silhouette is enlarged and the pulmonary vascularity is engorged. The trachea is midline. There are degenerative changes of both shoulders.  IMPRESSION: The findings are  consistent with congestive heart failure with pulmonary interstitial edema. A moderate size right pleural effusion is suspected.   Electronically Signed   By: David  Martinique   On: 04/29/2014 23:33     EKG Interpretation   Date/Time:  Friday Apr 29 2014 23:28:28 EDT Ventricular Rate:  109 PR Interval:    QRS Duration: 82 QT Interval:  346 QTC Calculation: 466 R Axis:   60 Text Interpretation:  Atrial flutter Ventricular premature complex Minimal  ST depression, diffuse leads changes noted since prior Confirmed by  Christy Gentles  MD, Jiovani Mccammon ([id number]) on 04/29/2014 11:48:13 PM      MDM   Final diagnoses:  Atrial flutter  CHF (congestive heart failure)  Status asthmaticus  Dehydration  Hyponatremia  Pleural effusion, right    Nursing notes including past medical history and social history reviewed and considered in documentation xrays reviewed and considered Labs/vital reviewed and considered  I personally performed the services described in this documentation, which was scribed in my presence. The recorded information has been reviewed and is accurate.        Sharyon Cable, MD 04/30/14 [phone]

## 2014-04-29 NOTE — ED Notes (Signed)
Patient was given solumedrol 125 ans albuterol tx prior to arrival via ems

## 2014-04-29 NOTE — ED Notes (Signed)
Patient has a cast on right foot

## 2014-04-30 ENCOUNTER — Inpatient Hospital Stay (HOSPITAL_COMMUNITY): Payer: PRIVATE HEALTH INSURANCE

## 2014-04-30 ENCOUNTER — Encounter (HOSPITAL_COMMUNITY): Payer: Self-pay | Admitting: Internal Medicine

## 2014-04-30 DIAGNOSIS — R0602 Shortness of breath: Secondary | ICD-10-CM | POA: Diagnosis present

## 2014-04-30 DIAGNOSIS — I252 Old myocardial infarction: Secondary | ICD-10-CM | POA: Diagnosis not present

## 2014-04-30 DIAGNOSIS — F329 Major depressive disorder, single episode, unspecified: Secondary | ICD-10-CM | POA: Diagnosis present

## 2014-04-30 DIAGNOSIS — Z880 Allergy status to penicillin: Secondary | ICD-10-CM | POA: Diagnosis not present

## 2014-04-30 DIAGNOSIS — Z8042 Family history of malignant neoplasm of prostate: Secondary | ICD-10-CM | POA: Diagnosis not present

## 2014-04-30 DIAGNOSIS — Z87891 Personal history of nicotine dependence: Secondary | ICD-10-CM | POA: Diagnosis not present

## 2014-04-30 DIAGNOSIS — R079 Chest pain, unspecified: Secondary | ICD-10-CM

## 2014-04-30 DIAGNOSIS — Z8249 Family history of ischemic heart disease and other diseases of the circulatory system: Secondary | ICD-10-CM | POA: Diagnosis not present

## 2014-04-30 DIAGNOSIS — J869 Pyothorax without fistula: Secondary | ICD-10-CM | POA: Diagnosis present

## 2014-04-30 DIAGNOSIS — Z96659 Presence of unspecified artificial knee joint: Secondary | ICD-10-CM | POA: Diagnosis not present

## 2014-04-30 DIAGNOSIS — I4891 Unspecified atrial fibrillation: Secondary | ICD-10-CM | POA: Diagnosis present

## 2014-04-30 DIAGNOSIS — J449 Chronic obstructive pulmonary disease, unspecified: Secondary | ICD-10-CM | POA: Diagnosis present

## 2014-04-30 DIAGNOSIS — E785 Hyperlipidemia, unspecified: Secondary | ICD-10-CM | POA: Diagnosis present

## 2014-04-30 DIAGNOSIS — J9601 Acute respiratory failure with hypoxia: Secondary | ICD-10-CM | POA: Diagnosis present

## 2014-04-30 DIAGNOSIS — E039 Hypothyroidism, unspecified: Secondary | ICD-10-CM | POA: Diagnosis present

## 2014-04-30 DIAGNOSIS — J90 Pleural effusion, not elsewhere classified: Secondary | ICD-10-CM

## 2014-04-30 DIAGNOSIS — IMO0002 Reserved for concepts with insufficient information to code with codable children: Secondary | ICD-10-CM | POA: Diagnosis present

## 2014-04-30 DIAGNOSIS — E871 Hypo-osmolality and hyponatremia: Secondary | ICD-10-CM | POA: Diagnosis present

## 2014-04-30 DIAGNOSIS — J9600 Acute respiratory failure, unspecified whether with hypoxia or hypercapnia: Secondary | ICD-10-CM | POA: Diagnosis present

## 2014-04-30 DIAGNOSIS — I2510 Atherosclerotic heart disease of native coronary artery without angina pectoris: Secondary | ICD-10-CM | POA: Diagnosis present

## 2014-04-30 DIAGNOSIS — M810 Age-related osteoporosis without current pathological fracture: Secondary | ICD-10-CM | POA: Diagnosis present

## 2014-04-30 DIAGNOSIS — I5032 Chronic diastolic (congestive) heart failure: Secondary | ICD-10-CM | POA: Diagnosis present

## 2014-04-30 DIAGNOSIS — Z9861 Coronary angioplasty status: Secondary | ICD-10-CM | POA: Diagnosis not present

## 2014-04-30 DIAGNOSIS — Z881 Allergy status to other antibiotic agents status: Secondary | ICD-10-CM | POA: Diagnosis not present

## 2014-04-30 DIAGNOSIS — I4892 Unspecified atrial flutter: Secondary | ICD-10-CM | POA: Diagnosis present

## 2014-04-30 DIAGNOSIS — I509 Heart failure, unspecified: Secondary | ICD-10-CM | POA: Diagnosis present

## 2014-04-30 DIAGNOSIS — Z888 Allergy status to other drugs, medicaments and biological substances status: Secondary | ICD-10-CM | POA: Diagnosis not present

## 2014-04-30 DIAGNOSIS — K219 Gastro-esophageal reflux disease without esophagitis: Secondary | ICD-10-CM | POA: Diagnosis present

## 2014-04-30 DIAGNOSIS — F3289 Other specified depressive episodes: Secondary | ICD-10-CM | POA: Diagnosis present

## 2014-04-30 DIAGNOSIS — F411 Generalized anxiety disorder: Secondary | ICD-10-CM | POA: Diagnosis present

## 2014-04-30 DIAGNOSIS — J383 Other diseases of vocal cords: Secondary | ICD-10-CM | POA: Diagnosis present

## 2014-04-30 DIAGNOSIS — Z825 Family history of asthma and other chronic lower respiratory diseases: Secondary | ICD-10-CM | POA: Diagnosis not present

## 2014-04-30 DIAGNOSIS — Z87311 Personal history of (healed) other pathological fracture: Secondary | ICD-10-CM | POA: Diagnosis not present

## 2014-04-30 DIAGNOSIS — J189 Pneumonia, unspecified organism: Secondary | ICD-10-CM | POA: Diagnosis present

## 2014-04-30 DIAGNOSIS — Z7901 Long term (current) use of anticoagulants: Secondary | ICD-10-CM | POA: Diagnosis not present

## 2014-04-30 DIAGNOSIS — I10 Essential (primary) hypertension: Secondary | ICD-10-CM | POA: Diagnosis present

## 2014-04-30 DIAGNOSIS — I110 Hypertensive heart disease with heart failure: Secondary | ICD-10-CM | POA: Diagnosis present

## 2014-04-30 DIAGNOSIS — J45902 Unspecified asthma with status asthmaticus: Secondary | ICD-10-CM

## 2014-04-30 DIAGNOSIS — A4902 Methicillin resistant Staphylococcus aureus infection, unspecified site: Secondary | ICD-10-CM | POA: Diagnosis present

## 2014-04-30 DIAGNOSIS — E559 Vitamin D deficiency, unspecified: Secondary | ICD-10-CM | POA: Diagnosis present

## 2014-04-30 DIAGNOSIS — Z79899 Other long term (current) drug therapy: Secondary | ICD-10-CM | POA: Diagnosis not present

## 2014-04-30 DIAGNOSIS — Z836 Family history of other diseases of the respiratory system: Secondary | ICD-10-CM | POA: Diagnosis not present

## 2014-04-30 DIAGNOSIS — Z882 Allergy status to sulfonamides status: Secondary | ICD-10-CM | POA: Diagnosis not present

## 2014-04-30 DIAGNOSIS — Z886 Allergy status to analgesic agent status: Secondary | ICD-10-CM | POA: Diagnosis not present

## 2014-04-30 DIAGNOSIS — IMO0001 Reserved for inherently not codable concepts without codable children: Secondary | ICD-10-CM | POA: Diagnosis present

## 2014-04-30 LAB — BASIC METABOLIC PANEL
BUN: 14 mg/dL (ref 6–23)
CO2: 24 mEq/L (ref 19–32)
Calcium: 8.3 mg/dL — ABNORMAL LOW (ref 8.4–10.5)
Chloride: 87 mEq/L — ABNORMAL LOW (ref 96–112)
Creatinine, Ser: 0.56 mg/dL (ref 0.50–1.10)
GFR calc Af Amer: >90 mL/min (ref >90)
GFR calc non Af Amer: 88 mL/min — ABNORMAL LOW (ref >90)
Glucose, Bld: 201 mg/dL — ABNORMAL HIGH (ref 70–99)
Potassium: 3.7 mEq/L (ref 3.7–5.3)
Sodium: 128 mEq/L — ABNORMAL LOW (ref 137–147)

## 2014-04-30 LAB — LACTIC ACID, PLASMA: Lactic Acid, Venous: 1.3 mmol/L (ref 0.5–2.2)

## 2014-04-30 LAB — CBC
HCT: 36.9 % (ref 36.0–46.0)
Hemoglobin: 13 g/dL (ref 12.0–15.0)
MCH: 32.1 pg (ref 26.0–34.0)
MCHC: 35.2 g/dL (ref 30.0–36.0)
MCV: 91.1 fL (ref 78.0–100.0)
Platelets: 257 10*3/uL (ref 150–400)
RBC: 4.05 MIL/uL (ref 3.87–5.11)
RDW: 13.8 % (ref 11.5–15.5)
WBC: 24.6 10*3/uL — ABNORMAL HIGH (ref 4.0–10.5)

## 2014-04-30 LAB — PRO B NATRIURETIC PEPTIDE
Pro B Natriuretic peptide (BNP): 1774 pg/mL — ABNORMAL HIGH (ref 0–450)
Pro B Natriuretic peptide (BNP): 2780 pg/mL — ABNORMAL HIGH (ref 0–450)

## 2014-04-30 LAB — PROCALCITONIN: Procalcitonin: 2.33 ng/mL

## 2014-04-30 LAB — STREP PNEUMONIAE URINARY ANTIGEN: Strep Pneumo Urinary Antigen: NEGATIVE

## 2014-04-30 LAB — CREATININE, URINE, RANDOM: Creatinine, Urine: 142.41 mg/dL

## 2014-04-30 LAB — TSH: TSH: 0.521 u[IU]/mL (ref 0.350–4.500)

## 2014-04-30 LAB — OSMOLALITY: Osmolality: 290 mOsm/kg (ref 275–300)

## 2014-04-30 LAB — SODIUM, URINE, RANDOM: Sodium, Ur: <20 mEq/L

## 2014-04-30 LAB — OSMOLALITY, URINE: Osmolality, Ur: 434 mOsm/kg (ref 390–1090)

## 2014-04-30 LAB — MRSA PCR SCREENING: MRSA by PCR: POSITIVE — AB

## 2014-04-30 MED ORDER — SERTRALINE HCL 100 MG PO TABS
100.0000 mg | ORAL_TABLET | Freq: Every day | ORAL | Status: DC
  Administered 2014-04-30 – 2014-05-13 (×13): 100 mg via ORAL
  Filled 2014-04-30 (×17): qty 1

## 2014-04-30 MED ORDER — LEVOFLOXACIN 500 MG PO TABS: 500.0000 mg | ORAL_TABLET | Freq: Every day | ORAL | Status: DC

## 2014-04-30 MED ORDER — ISOSORBIDE MONONITRATE ER 60 MG PO TB24
60.0000 mg | ORAL_TABLET | Freq: Every day | ORAL | Status: DC
  Filled 2014-04-30: qty 1

## 2014-04-30 MED ORDER — ONDANSETRON HCL 4 MG PO TABS: 4.0000 mg | ORAL_TABLET | Freq: Four times a day (QID) | ORAL | Status: DC | PRN

## 2014-04-30 MED ORDER — METOPROLOL TARTRATE 1 MG/ML IV SOLN
5.0000 mg | Freq: Four times a day (QID) | INTRAVENOUS | Status: DC | PRN
  Administered 2014-05-01: 5 mg via INTRAVENOUS
  Filled 2014-04-30 (×2): qty 5

## 2014-04-30 MED ORDER — ALPRAZOLAM 0.25 MG PO TABS
0.2500 mg | ORAL_TABLET | Freq: Two times a day (BID) | ORAL | Status: DC | PRN
  Administered 2014-04-30 – 2014-05-14 (×10): 0.25 mg via ORAL
  Filled 2014-04-30 (×10): qty 1

## 2014-04-30 MED ORDER — MUPIROCIN 2 % EX OINT
1.0000 "application " | TOPICAL_OINTMENT | Freq: Two times a day (BID) | CUTANEOUS | Status: AC
  Administered 2014-04-30 – 2014-05-04 (×10): 1 via NASAL
  Filled 2014-04-30 (×3): qty 22

## 2014-04-30 MED ORDER — LEVOTHYROXINE SODIUM 50 MCG PO TABS
50.0000 ug | ORAL_TABLET | Freq: Every day | ORAL | Status: DC
  Administered 2014-04-30 – 2014-05-14 (×14): 50 ug via ORAL
  Filled 2014-04-30 (×10): qty 1
  Filled 2014-04-30: qty 2
  Filled 2014-04-30 (×7): qty 1

## 2014-04-30 MED ORDER — VANCOMYCIN HCL IN DEXTROSE 750-5 MG/150ML-% IV SOLN
750.0000 mg | Freq: Two times a day (BID) | INTRAVENOUS | Status: DC
  Administered 2014-04-30 – 2014-05-01 (×3): 750 mg via INTRAVENOUS
  Filled 2014-04-30 (×6): qty 150

## 2014-04-30 MED ORDER — SODIUM CHLORIDE 0.9 % IV SOLN
250.0000 mg | Freq: Four times a day (QID) | INTRAVENOUS | Status: DC
  Administered 2014-04-30 – 2014-05-04 (×17): 250 mg via INTRAVENOUS
  Filled 2014-04-30 (×24): qty 250

## 2014-04-30 MED ORDER — SODIUM CHLORIDE 0.9 % IV SOLN
INTRAVENOUS | Status: DC
  Administered 2014-05-09: 10 mL/h via INTRAVENOUS

## 2014-04-30 MED ORDER — MONTELUKAST SODIUM 10 MG PO TABS
10.0000 mg | ORAL_TABLET | Freq: Every day | ORAL | Status: DC
  Administered 2014-04-30 – 2014-05-13 (×13): 10 mg via ORAL
  Filled 2014-04-30 (×17): qty 1

## 2014-04-30 MED ORDER — LEVALBUTEROL HCL 0.63 MG/3ML IN NEBU
0.6300 mg | INHALATION_SOLUTION | Freq: Four times a day (QID) | RESPIRATORY_TRACT | Status: DC | PRN
  Administered 2014-04-30: 0.63 mg via RESPIRATORY_TRACT
  Filled 2014-04-30: qty 3

## 2014-04-30 MED ORDER — METHYLPREDNISOLONE SODIUM SUCC 40 MG IJ SOLR
80.0000 mg | Freq: Once | INTRAMUSCULAR | Status: AC
  Administered 2014-04-30: 80 mg via INTRAVENOUS
  Filled 2014-04-30: qty 2

## 2014-04-30 MED ORDER — DILTIAZEM HCL 25 MG/5ML IV SOLN
10.0000 mg | Freq: Once | INTRAVENOUS | Status: AC
  Administered 2014-04-30: 10 mg via INTRAVENOUS
  Filled 2014-04-30: qty 5

## 2014-04-30 MED ORDER — PREDNISONE 20 MG PO TABS: 60.0000 mg | ORAL_TABLET | Freq: Every day | ORAL | Status: DC

## 2014-04-30 MED ORDER — ATORVASTATIN CALCIUM 10 MG PO TABS
10.0000 mg | ORAL_TABLET | Freq: Every day | ORAL | Status: DC
  Administered 2014-05-01 – 2014-05-14 (×12): 10 mg via ORAL
  Filled 2014-04-30 (×16): qty 1

## 2014-04-30 MED ORDER — PANTOPRAZOLE SODIUM 40 MG PO TBEC
80.0000 mg | DELAYED_RELEASE_TABLET | Freq: Every day | ORAL | Status: DC
  Administered 2014-04-30: 80 mg via ORAL
  Filled 2014-04-30: qty 2

## 2014-04-30 MED ORDER — METOPROLOL TARTRATE 1 MG/ML IV SOLN: 5.0000 mg | Freq: Four times a day (QID) | INTRAVENOUS | Status: DC | PRN

## 2014-04-30 MED ORDER — CALCIUM CARBONATE-VITAMIN D 500-200 MG-UNIT PO TABS
1.0000 | ORAL_TABLET | Freq: Every day | ORAL | Status: DC
  Filled 2014-04-30 (×2): qty 1

## 2014-04-30 MED ORDER — ONDANSETRON HCL 4 MG PO TABS: 4.0000 mg | ORAL_TABLET | Freq: Three times a day (TID) | ORAL | Status: DC | PRN

## 2014-04-30 MED ORDER — OMEGA-3-ACID ETHYL ESTERS 1 G PO CAPS
2.0000 g | ORAL_CAPSULE | Freq: Two times a day (BID) | ORAL | Status: DC
  Administered 2014-04-30: 2 g via ORAL
  Filled 2014-04-30 (×4): qty 2

## 2014-04-30 MED ORDER — METHYLPREDNISOLONE SODIUM SUCC 40 MG IJ SOLR
40.0000 mg | Freq: Two times a day (BID) | INTRAMUSCULAR | Status: DC
  Administered 2014-04-30: 40 mg via INTRAVENOUS
  Filled 2014-04-30 (×4): qty 1

## 2014-04-30 MED ORDER — RIVAROXABAN 20 MG PO TABS
20.0000 mg | ORAL_TABLET | Freq: Every day | ORAL | Status: DC
  Filled 2014-04-30: qty 1

## 2014-04-30 MED ORDER — HYDROMORPHONE HCL PF 1 MG/ML IJ SOLN
0.5000 mg | INTRAMUSCULAR | Status: DC | PRN
  Administered 2014-04-30 – 2014-05-04 (×15): 0.5 mg via INTRAVENOUS
  Filled 2014-04-30 (×17): qty 1

## 2014-04-30 MED ORDER — MORPHINE SULFATE 2 MG/ML IJ SOLN
INTRAMUSCULAR | Status: AC
  Administered 2014-04-30: 2 mg via INTRAVENOUS
  Filled 2014-04-30: qty 1

## 2014-04-30 MED ORDER — MORPHINE SULFATE 2 MG/ML IJ SOLN
2.0000 mg | INTRAMUSCULAR | Status: DC | PRN
  Administered 2014-04-30 (×4): 2 mg via INTRAVENOUS
  Filled 2014-04-30 (×4): qty 1

## 2014-04-30 MED ORDER — LEVALBUTEROL HCL 0.63 MG/3ML IN NEBU
0.6300 mg | INHALATION_SOLUTION | Freq: Four times a day (QID) | RESPIRATORY_TRACT | Status: DC
  Administered 2014-04-30 – 2014-05-02 (×10): 0.63 mg via RESPIRATORY_TRACT
  Filled 2014-04-30 (×18): qty 3

## 2014-04-30 MED ORDER — FENTANYL CITRATE 0.05 MG/ML IJ SOLN
25.0000 ug | INTRAMUSCULAR | Status: DC | PRN
  Administered 2014-04-30: 100 ug via INTRAVENOUS
  Administered 2014-04-30: 50 ug via INTRAVENOUS
  Filled 2014-04-30 (×2): qty 2

## 2014-04-30 MED ORDER — PIPERACILLIN-TAZOBACTAM 3.375 G IVPB
3.3750 g | Freq: Three times a day (TID) | INTRAVENOUS | Status: DC
  Filled 2014-04-30 (×7): qty 50

## 2014-04-30 MED ORDER — IOHEXOL 350 MG/ML SOLN
100.0000 mL | Freq: Once | INTRAVENOUS | Status: AC | PRN
  Administered 2014-04-30: 100 mL via INTRAVENOUS

## 2014-04-30 MED ORDER — ONDANSETRON HCL 4 MG/2ML IJ SOLN: 4.0000 mg | Freq: Four times a day (QID) | INTRAMUSCULAR | Status: DC | PRN

## 2014-04-30 MED ORDER — ALPRAZOLAM 0.5 MG PO TABS
1.0000 mg | ORAL_TABLET | Freq: Every evening | ORAL | Status: DC | PRN
  Administered 2014-04-30 – 2014-05-12 (×8): 1 mg via ORAL
  Filled 2014-04-30 (×8): qty 2

## 2014-04-30 MED ORDER — PANTOPRAZOLE SODIUM 40 MG PO TBEC
40.0000 mg | DELAYED_RELEASE_TABLET | Freq: Every day | ORAL | Status: DC
  Administered 2014-05-01 – 2014-05-03 (×3): 40 mg via ORAL
  Filled 2014-04-30 (×3): qty 1

## 2014-04-30 MED ORDER — CHLORHEXIDINE GLUCONATE CLOTH 2 % EX PADS
6.0000 | MEDICATED_PAD | Freq: Every day | CUTANEOUS | Status: AC
  Administered 2014-05-01 – 2014-05-05 (×5): 6 via TOPICAL

## 2014-04-30 MED ORDER — FLUTICASONE PROPIONATE 50 MCG/ACT NA SUSP
2.0000 | Freq: Every day | NASAL | Status: DC
  Administered 2014-04-30 – 2014-05-14 (×15): 2 via NASAL
  Filled 2014-04-30 (×2): qty 16

## 2014-04-30 MED ORDER — METHYLPREDNISOLONE SODIUM SUCC 125 MG IJ SOLR
60.0000 mg | Freq: Two times a day (BID) | INTRAMUSCULAR | Status: DC
  Administered 2014-04-30: 60 mg via INTRAVENOUS
  Filled 2014-04-30: qty 2

## 2014-04-30 MED ORDER — VITAMIN D3 25 MCG (1000 UNIT) PO TABS
1000.0000 [IU] | ORAL_TABLET | Freq: Two times a day (BID) | ORAL | Status: DC
  Administered 2014-04-30 (×2): 1000 [IU] via ORAL
  Filled 2014-04-30 (×6): qty 1

## 2014-04-30 MED ORDER — DENOSUMAB 60 MG/ML ~~LOC~~ SOLN: 60.0000 mg | SUBCUTANEOUS | Status: DC

## 2014-04-30 MED ORDER — METOPROLOL TARTRATE 50 MG PO TABS
50.0000 mg | ORAL_TABLET | Freq: Two times a day (BID) | ORAL | Status: DC
  Administered 2014-04-30: 50 mg via ORAL
  Filled 2014-04-30 (×2): qty 1

## 2014-04-30 MED ORDER — LEVALBUTEROL HCL 0.63 MG/3ML IN NEBU
0.6300 mg | INHALATION_SOLUTION | RESPIRATORY_TRACT | Status: DC | PRN
  Administered 2014-05-05 – 2014-05-09 (×3): 0.63 mg via RESPIRATORY_TRACT
  Filled 2014-04-30: qty 3

## 2014-04-30 MED ORDER — VANCOMYCIN HCL IN DEXTROSE 1-5 GM/200ML-% IV SOLN
1000.0000 mg | Freq: Once | INTRAVENOUS | Status: AC
  Administered 2014-04-30: 1000 mg via INTRAVENOUS
  Filled 2014-04-30: qty 200

## 2014-04-30 MED ORDER — ALBUTEROL SULFATE (2.5 MG/3ML) 0.083% IN NEBU: 2.5000 mg | INHALATION_SOLUTION | Freq: Four times a day (QID) | RESPIRATORY_TRACT | Status: DC | PRN

## 2014-04-30 MED ORDER — FUROSEMIDE 10 MG/ML IJ SOLN
40.0000 mg | Freq: Every day | INTRAMUSCULAR | Status: DC
  Administered 2014-04-30 – 2014-05-02 (×3): 40 mg via INTRAVENOUS
  Filled 2014-04-30 (×4): qty 4

## 2014-04-30 MED ORDER — IPRATROPIUM BROMIDE 0.02 % IN SOLN
0.5000 mg | Freq: Four times a day (QID) | RESPIRATORY_TRACT | Status: DC
  Administered 2014-04-30 – 2014-05-02 (×10): 0.5 mg via RESPIRATORY_TRACT
  Filled 2014-04-30 (×10): qty 2.5

## 2014-04-30 MED ORDER — SODIUM CHLORIDE 0.9 % IJ SOLN
3.0000 mL | Freq: Two times a day (BID) | INTRAMUSCULAR | Status: DC
  Administered 2014-04-30 – 2014-05-02 (×6): 3 mL via INTRAVENOUS
  Administered 2014-05-03: 11:00:00 via INTRAVENOUS
  Administered 2014-05-03 – 2014-05-12 (×13): 3 mL via INTRAVENOUS

## 2014-04-30 MED ORDER — HEPARIN (PORCINE) IN NACL 100-0.45 UNIT/ML-% IJ SOLN
1000.0000 [IU]/h | INTRAMUSCULAR | Status: DC
  Administered 2014-04-30: 800 [IU]/h via INTRAVENOUS
  Administered 2014-05-01: 1000 [IU]/h via INTRAVENOUS
  Filled 2014-04-30 (×2): qty 250

## 2014-04-30 NOTE — Consult Note (Signed)
Consult requested by: [facility] Consult requested for respiratory failure:  HPI: This is a 78 year old who came to the emergency room because of shortness of breath cough and pain in the right rib cage. She is not aware of any fever at home. She has a history of chronic atrial fibrillation, coronary artery occlusive disease, fibromyalgia, hypertension recent surgery on her ankle COPD. She was found to be in atrial fibrillation with rapid ventricular response in the emergency department and this was treated. She had CT of the chest for pulmonary emboli and is found to have right lower lobe pneumonia and a right pleural effusion. She still has pain in her chest  Past Medical History  Diagnosis Date  . GERD (gastroesophageal reflux disease)   . Anxiety disorder   . Arthritis   . CAD (coronary artery disease)     Cath May 2010.  Nonbstructive  . Depression   . Hypothyroid   . Asthmatic bronchitis   . Hypertension     dr Percival Spanish  . OA (osteoarthritis)   . Chronic bronchitis   . 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 Encephalitis     d/t meningitis  . PONV (postoperative nausea and vomiting)     history of cardiac arrest day 1 post surgery  in 2008  . Esophageal motility disorder   . Atrial fibrillation   . Hyperlipidemia   . Fibromyalgia   . Vitamin D deficiency   . Status post dilation of esophageal narrowing   . Bowel obstruction     blockage     Family History  Problem Relation Age of Onset  . Prostate cancer Brother   . Heart disease Mother   . Emphysema Sister   . Asthma Mother      History   Social History  . Marital Status: Widowed    Spouse Name: N/A    Number of Children: 2  . Years of Education: N/A   Occupational History  . Retired    Social History Main Topics  . Smoking status: Former Smoker -- 1.00 packs/day for 15 years    Types: Cigarettes    Quit date: 12/16/1989  . Smokeless tobacco: Never Used  . Alcohol  Use: No  . Drug Use: No  . Sexual Activity: No   Other Topics Concern  . None   Social History Narrative  . None     ROS: She is unaware of fever chills hemoptysis night sweats or weight loss. She has not coughed up sputum.    Objective: Vital signs in last 24 hours: Temp:  [99.1 F (37.3 C)] 99.1 F (37.3 C) (05/15 2218) Pulse Rate:  [41-137] 103 (05/16 0854) Resp:  [15-25] 19 (05/16 0800) BP: (109-171)/(59-102) 114/84 mmHg (05/16 0854) SpO2:  [86 %-96 %] 86 % (05/16 0854) Weight:  [69.4 kg (153 lb)] 69.4 kg (153 lb) (05/15 2218) Weight change:     Intake/Output from previous day: 05/15 0701 - 05/16 0700 In: -  Out: 300 [Urine:300]  PHYSICAL EXAM She is awake and alert. She is hoarse. Her pupils are reactive. Nose and throat are clear. She appears to be acutely sick. Her neck is supple without masses. Her chest shows decreased breath sounds bilaterally and some rales on the right. Her heart is irregular without gallop. Her abdomen is soft without masses. Extremities show no edema. Central nervous system examination is grossly intact  Lab Results: Basic Metabolic Panel:  Recent Labs  04/29/14 2248  04/30/14 0525  NA 126* 128*  K 4.1 3.7  CL 86* 87*  CO2 24 24  GLUCOSE 133* 201*  BUN 17 14  CREATININE 0.59 0.56  CALCIUM 8.7 8.3*   Liver Function Tests: No results found for this basename: AST, ALT, ALKPHOS, BILITOT, PROT, ALBUMIN,  in the last 72 hours No results found for this basename: LIPASE, AMYLASE,  in the last 72 hours No results found for this basename: AMMONIA,  in the last 72 hours CBC:  Recent Labs  04/29/14 2248 04/30/14 0525  WBC 25.2* 24.6*  NEUTROABS 22.5*  --   HGB 12.9 13.0  HCT 35.4* 36.9  MCV 93.4 91.1  PLT 220 257   Cardiac Enzymes: No results found for this basename: CKTOTAL, CKMB, CKMBINDEX, TROPONINI,  in the last 72 hours BNP:  Recent Labs  04/29/14 2248  PROBNP 1774.0*   D-Dimer: No results found for this basename:  DDIMER,  in the last 72 hours CBG: No results found for this basename: GLUCAP,  in the last 72 hours Hemoglobin A1C: No results found for this basename: HGBA1C,  in the last 72 hours Fasting Lipid Panel: No results found for this basename: CHOL, HDL, LDLCALC, TRIG, CHOLHDL, LDLDIRECT,  in the last 72 hours Thyroid Function Tests: No results found for this basename: TSH, T4TOTAL, FREET4, T3FREE, THYROIDAB,  in the last 72 hours Anemia Panel: No results found for this basename: VITAMINB12, FOLATE, FERRITIN, TIBC, IRON, RETICCTPCT,  in the last 72 hours Coagulation: No results found for this basename: LABPROT, INR,  in the last 72 hours Urine Drug Screen: Drugs of Abuse  No results found for this basename: labopia, cocainscrnur, labbenz, amphetmu, thcu, labbarb    Alcohol Level: No results found for this basename: ETH,  in the last 72 hours Urinalysis: No results found for this basename: COLORURINE, APPERANCEUR, LABSPEC, PHURINE, GLUCOSEU, HGBUR, BILIRUBINUR, KETONESUR, PROTEINUR, UROBILINOGEN, NITRITE, LEUKOCYTESUR,  in the last 72 hours Misc. Labs:   ABGS: No results found for this basename: PHART, PCO2, PO2ART, TCO2, HCO3,  in the last 72 hours   MICROBIOLOGY: No results found for this or any previous visit (from the past 240 hour(s)).  Studies/Results: Ct Angio Chest Pe W/cm &/or Wo Cm  04/30/2014   CLINICAL DATA:  Recent ankle fracture, back pain, shortness of breath.  EXAM: CT ANGIOGRAPHY CHEST WITH CONTRAST  TECHNIQUE: Multidetector CT imaging of the chest was performed using the standard protocol during bolus administration of intravenous contrast. Multiplanar CT image reconstructions and MIPs were obtained to evaluate the vascular anatomy.  CONTRAST:  17mL OMNIPAQUE IOHEXOL 350 MG/ML SOLN  COMPARISON:  DG CHEST 1V PORT dated 04/29/2014  FINDINGS: Suboptimal contrast opacification of the main pulmonary artery (220 Hounsfield units), limiting assessment for subsegmental pulmonary  embolism, no pulmonary arterial filling defects seen to the segmental branches. Main pulmonary artery is 2.8 cm in transaxial dimension.  The heart appears moderately enlarged, Coronary artery calcifications present. Pericardium is nonsuspicious. Thoracic aorta is normal in course and caliber with moderate calcific atherosclerosis. No lymphadenopathy by CT size criteria.  Moderate right pleural effusion, lobulated in appearance could reflect loculation. Hypoenhancing right lower lobe consolidation. Trace left pleural effusion. Pulmonary vasculature engorgement. No pneumothorax. Mild effacement of the right lower lobe bronchi.  Included view of the abdomen is unremarkable. Severe degenerative change of the right shoulder with tiny calcifications which could reflect loose bodies. Moderate degenerative change of the thoracic spine, motion through the lower thoracic level results in spurious elongation of a  single thoracic vertebra.  Review of the MIP images confirms the above findings.  IMPRESSION: Suboptimal contrast bolus timing with no convincing evidence of pulmonary arterial embolism to at least the segmental branches.  Moderate right pleural effusion, possibly loculated, with hypoenhancing underlying lung suggesting atelectasis and superimposed pneumonia.  Cardiomegaly and pulmonary vasculature congestion.   Electronically Signed   By: Elon Alas   On: 04/30/2014 01:55   Dg Chest Port 1 View  04/29/2014   CLINICAL DATA:  Shortness of breath and cough with history of bronchitis  EXAM: PORTABLE CHEST - 1 VIEW  COMPARISON:  DG CHEST 1V PORT dated 04/01/2014  FINDINGS: There is new increased density in the right hemi thorax consistent with a pleural effusion. The hemidiaphragm is obscured. On the left the lung is well-expanded. The interstitial markings are mildly increased. The cardiac silhouette is enlarged and the pulmonary vascularity is engorged. The trachea is midline. There are degenerative changes of  both shoulders.  IMPRESSION: The findings are consistent with congestive heart failure with pulmonary interstitial edema. A moderate size right pleural effusion is suspected.   Electronically Signed   By: David  Martinique   On: 04/29/2014 23:33    Medications:  Prior to Admission:  Prescriptions prior to admission  Medication Sig Dispense Refill  . acetaminophen (TYLENOL) 325 MG tablet Take 650 mg by mouth every 6 (six) hours as needed. For pain/fever      . albuterol (PROVENTIL HFA;VENTOLIN HFA) 108 (90 BASE) MCG/ACT inhaler Inhale 2 puffs into the lungs every 6 (six) hours as needed for wheezing.  1 Inhaler  4  . ALPRAZolam (XANAX) 0.5 MG tablet Take 1 mg by mouth at bedtime as needed for anxiety.      . Ascorbic Acid (VITAMIN C PO) Take 1 tablet by mouth every morning.      . Calcium Carbonate-Vitamin D (CALCIUM 600+D) 600-400 MG-UNIT per tablet Take 1 tablet by mouth daily at 6 PM.       . cholecalciferol (VITAMIN D) 1000 UNITS tablet Take 1,000 Units by mouth 2 (two) times daily with breakfast and lunch.       . cyclobenzaprine (FLEXERIL) 10 MG tablet Take 10 mg by mouth 3 (three) times daily as needed for muscle spasms.      Marland Kitchen dextromethorphan-guaiFENesin (MUCINEX DM) 30-600 MG per 12 hr tablet Take 1 tablet by mouth every 12 (twelve) hours. scheduled      . dicyclomine (BENTYL) 10 MG capsule Take 10-20 mg by mouth every 6 (six) hours as needed for spasms. 10-20mg  tablets depending on how bad the spasms are.      . fluticasone (FLONASE) 50 MCG/ACT nasal spray Place 2 sprays into both nostrils daily.      . isosorbide mononitrate (IMDUR) 60 MG 24 hr tablet Take 60 mg by mouth at bedtime.      Marland Kitchen levofloxacin (LEVAQUIN) 500 MG tablet Take 500 mg by mouth daily. 7 day course starting on 04/28/2014      . levothyroxine (SYNTHROID, LEVOTHROID) 25 MCG tablet Take 50 mcg by mouth daily before breakfast.      . loratadine (CLARITIN) 10 MG tablet Take 10 mg by mouth 2 (two) times daily.      .  methylPREDNISolone sodium succinate (SOLU-MEDROL) 40 mg/mL injection Inject 80 mg into the vein once.      . metoprolol (LOPRESSOR) 50 MG tablet Take 50 mg by mouth 2 (two) times daily.      . montelukast (SINGULAIR) 10 MG tablet  Take 10 mg by mouth at bedtime.      . Multiple Vitamin (MULTIVITAMIN WITH MINERALS) TABS Take 1 tablet by mouth daily.      Marland Kitchen omega-3 acid ethyl esters (LOVAZA) 1 G capsule Take 2 g by mouth 2 (two) times daily.      Marland Kitchen omeprazole-sodium bicarbonate (ZEGERID) 40-1100 MG per capsule Take 1 capsule by mouth daily before breakfast.      . ondansetron (ZOFRAN) 4 MG tablet Take 4 mg by mouth every 8 (eight) hours as needed for nausea or vomiting.      Marland Kitchen oxyCODONE (OXY IR/ROXICODONE) 5 MG immediate release tablet Take one tablet by mouth every 4 hours as needed for moderate to severe pain  180 tablet  0  . predniSONE (DELTASONE) 20 MG tablet Take 60 mg by mouth daily. 3 day course starting on 04/28/2014      . rivaroxaban (XARELTO) 20 MG TABS tablet Take 1 tablet (20 mg total) by mouth daily with supper.  30 tablet  0  . rosuvastatin (CRESTOR) 20 MG tablet Take 20 mg by mouth at bedtime.      . sertraline (ZOLOFT) 100 MG tablet Take 100 mg by mouth at bedtime.      . traMADol (ULTRAM) 50 MG tablet Take 50 mg by mouth every 6 (six) hours as needed for moderate pain.       Marland Kitchen VITAMIN E PO Take 1 capsule by mouth every morning.      Marland Kitchen albuterol (PROVENTIL) (2.5 MG/3ML) 0.083% nebulizer solution Take 2.5 mg by nebulization every 6 (six) hours as needed for wheezing or shortness of breath.      . denosumab (PROLIA) 60 MG/ML SOLN injection Inject 60 mg into the skin every 6 (six) months. Administer in upper arm, thigh, or abdomen      . nitroGLYCERIN (NITROSTAT) 0.4 MG SL tablet Place 0.4 mg under the tongue every 5 (five) minutes as needed. For chest pain       Scheduled: . atorvastatin  10 mg Oral q1800  . calcium-vitamin D  1 tablet Oral q1800  . cholecalciferol  1,000 Units Oral  BID WC  . fluticasone  2 spray Each Nare Daily  . furosemide  40 mg Intravenous Daily  . imipenem-cilastatin  250 mg Intravenous 4 times per day  . ipratropium  0.5 mg Nebulization Q6H  . levalbuterol  0.63 mg Nebulization Q6H  . levothyroxine  50 mcg Oral QAC breakfast  . methylPREDNISolone (SOLU-MEDROL) injection  60 mg Intravenous Q12H  . metoprolol  50 mg Oral BID  . montelukast  10 mg Oral QHS  . omega-3 acid ethyl esters  2 g Oral BID  . pantoprazole  80 mg Oral Daily  . rivaroxaban  20 mg Oral Q supper  . sertraline  100 mg Oral QHS  . sodium chloride  3 mL Intravenous Q12H  . vancomycin  1,000 mg Intravenous Once  . vancomycin  750 mg Intravenous Q12H   Continuous:  OFB:PZWCHENIDP, metoprolol, morphine, ondansetron (ZOFRAN) IV, ondansetron, ondansetron  Assesment: She was admitted with atrial fibrillation with rapid ventricular response. She is known to have COPD. On her CT she has a extensive right lower lobe infiltrate with associated pleural effusion. She has chest pain associated with this. She also has some congestive heart failure. She's had recent surgery on her ankle but does not show a pulmonary embolus on CT. I discussed her situation with Dr. Pia Mau cardio thoracic surgeon and although he and I agree  that she does not need something like a VAT procedure now she may need drainage of this fluid with chest tube Judithe Modest. He suggested she be transferred to [facility] in that he will follow her there. I will discuss with critical care at [facility] Problem:   CHF (congestive heart failure) Active Problems:   HYPOTHYROIDISM   HYPERLIPIDEMIA   ATRIAL FIBRILLATION, PAROXYSMAL   REACTIVE AIRWAY DISEASE   Nonunion of subtalar arthrodesis    Plan: Potential transfer to [facility].    LOS: 1 day   Alonza Bogus 04/30/2014, 9:28 AM

## 2014-04-30 NOTE — Progress Notes (Signed)
ANTICOAGULATION CONSULT NOTE - Initial Consult  Pharmacy Consult for heparin Indication: atrial fibrillation  Allergies  Allergen Reactions  . Aspirin Other (See Comments)    REACTION: regular strength causes "heart to beat fast"  . Captopril Hypertension  . Naproxen Other (See Comments)    Tongue swelling  . Penicillins     Swelling around site  . Sulfonamide Derivatives Hives  . Clindamycin/Lincomycin     Patient Measurements: Height: 5' (152.4 cm) Weight: 152 lb 12.5 oz (69.3 kg) IBW/kg (Calculated) : 45.5 Heparin Dosing Weight: 60kg  Vital Signs: Temp: 98.2 F (36.8 C) (05/16 1450) Temp src: Oral (05/16 1450) BP: 116/94 mmHg (05/16 1600) Pulse Rate: 80 (05/16 1600)  Labs:  Recent Labs  04/29/14 2248 04/30/14 0525  HGB 12.9 13.0  HCT 35.4* 36.9  PLT 220 257  CREATININE 0.59 0.56    Estimated Creatinine Clearance: 51.9 ml/min (by C-G formula based on Cr of 0.56).   Medical History: Past Medical History  Diagnosis Date  . GERD (gastroesophageal reflux disease)   . Anxiety disorder   . Arthritis   . CAD (coronary artery disease)     Cath May 2010.  Nonbstructive  . Depression   . Hypothyroid   . Asthmatic bronchitis   . Hypertension     dr Percival Spanish  . OA (osteoarthritis)   . Chronic bronchitis   . 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 Encephalitis     d/t meningitis  . PONV (postoperative nausea and vomiting)     history of cardiac arrest day 1 post surgery  in 2008  . Esophageal motility disorder   . Atrial fibrillation   . Hyperlipidemia   . Fibromyalgia   . Vitamin D deficiency   . Status post dilation of esophageal narrowing   . Bowel obstruction     blockage    Medications:  Infusions:  . sodium chloride    . heparin      Assessment: 76 yof presented to the hospital with SOB and palpitations. She is on chronic xarelto for afib. Her last dose was 5/15 in the afternoon. She isn't able to specify more  exactly but it has likely been >24 hours since last dose at this point. CBC is WNL. To start IV heparin while xarelto is on hold.   Goal of Therapy:  Heparin level 0.3-0.7 units/ml aPTT 66-102 seconds Monitor platelets by anticoagulation protocol: Yes   Plan:  1. Heparin gtt 800 units/hr - no bolus 2. Check an 8 hour heparin level and aPTT 3. Daily heparin level, aPTT and CBC  Rande Lawman Ladarrian Asencio 04/30/2014,5:37 PM

## 2014-04-30 NOTE — Consult Note (Addendum)
[address]       [place] [address]             [phone]        [name] Medical Record [id number] Date of Birth: 04/14/1936  Referring: Dr. Chase Caller Primary Care: Redge Gainer, MD  Chief Complaint:    Chief Complaint  Patient presents with  . Shortness of Breath    sob for for 4 days. getting worse everyday.   . Cough    coughing up thick sputum per ems. no coughing noted upon arrival to er    History of Present Illness:     Patient is a frail 78 year old female who has been in a skilled nursing facility for the past month after having surgery on her foot by Dr. Doran Durand.  She has a previous history of myocardial infarction, angioplasty with Rotablator many years ago, most recent cath 2010, intermittent atrial fibrillation, followed by Dr.  Elsworth Soho for COPD. She was sent from the skilled nursing facility today for an emergency room last night. She was found to be in respiratory distress and in rapid atrial fibrillation. Chest CT was done to rule out pulmonary embolus, she was found to have consolidated pneumonia with a small to moderate right pleural effusion. She was transferred to [facility] because she may need a chest tube.   Current Activity/ Functional Status: Patient is not independent with mobility/ambulation, transfers, ADL's, IADL's.   Zubrod Score: At the time of surgery this patient's most appropriate activity status/level should be described as: []     0    Normal activity, no symptoms []     1    Restricted in physical strenuous activity but ambulatory, able to do out light work []     2    Ambulatory and capable of self care, unable to do work activities, up and about                 more than 50%  Of the time                            [x]     3    Only limited self care, in bed greater than 50% of waking hours []     4    Completely disabled, no self care, confined to bed or chair []     5    Moribund  Past Medical History   Diagnosis Date  . GERD (gastroesophageal reflux disease)   . Anxiety disorder   . Arthritis   . CAD (coronary artery disease)     Cath May 2010.  Nonbstructive  . Depression   . Hypothyroid   . Asthmatic bronchitis   . Hypertension     dr Percival Spanish  . OA (osteoarthritis)   . Chronic bronchitis   . 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 Encephalitis     d/t meningitis  . PONV (postoperative nausea and vomiting)     history of cardiac arrest day 1 post surgery  in 2008  . Esophageal motility disorder   . Atrial fibrillation   . Hyperlipidemia   . Fibromyalgia   . Vitamin D deficiency   . Status post dilation of esophageal narrowing   . Bowel obstruction     blockage    Past Surgical History  Procedure Laterality Date  . Total knee arthroplasty Right   .  Back surgery    . Coronary angioplasty with stent placement  2003  . Sinus surgery with instatrak    . Knee arthroscopy  03/26/2012    Procedure: ARTHROSCOPY KNEE;  Surgeon: [name], MD;  Location: [facility] OR;  Service: Orthopedics;  Laterality: Left;  with Debridement of Lateral Meniscus tear  . Rotator cuff repair Bilateral   . Ankle fusion  08/27/2012    Procedure: ARTHRODESIS ANKLE;  Surgeon: [name], MD;  Location: [facility] OR;  Service: Orthopedics;  Laterality: Right;  Arthrodesis right ankle and subtalar joint  . Foot arthrodesis, subtalar Right 2013  . Removal of implant Right 03/31/2014    DR HEWITT  . Hardware revision  03/31/2014    SUBTALOR  ARTHRODESIS       DR HEWITT  . Hardware removal Right 03/31/2014    Procedure: REMOVAL OF DEEP IMPLANTS X 3  RIGHT ;  Surgeon: [name], MD;  Location: [facility] OR;  Service: Orthopedics;  Laterality: Right;  . Arthrodesis tibiofibular Right 03/31/2014    Procedure: REVISION OF SUBTALOR ARTHRODESIS  RIGHT ;  Surgeon: [name], MD;  Location: [facility] OR;  Service: Orthopedics;  Laterality: Right;    History  Smoking status  . Former Smoker -- 1.00  packs/day for 15 years  . Types: Cigarettes  . Quit date: 12/16/1989  Smokeless tobacco  . Never Used    History  Alcohol Use No    History   Social History  . Marital Status: Widowed    Spouse Name: N/A    Number of Children: 2  . Years of Education: N/A   Occupational History  . Retired    Social History Main Topics  . Smoking status: Former Smoker -- 1.00 packs/day for 15 years    Types: Cigarettes    Quit date: 12/16/1989  . Smokeless tobacco: Never Used  . Alcohol Use: No  . Drug Use: No  . Sexual Activity: No   Other Topics Concern  . Not on file   Social History Narrative  . No narrative on file    Allergies  Allergen Reactions  . Aspirin Other (See Comments)    REACTION: regular strength causes "heart to beat fast"  . Captopril Hypertension  . Naproxen Other (See Comments)    Tongue swelling  . Penicillins     Swelling around site  . Sulfonamide Derivatives Hives  . Clindamycin/Lincomycin     Current Facility-Administered Medications  Medication Dose Route Frequency Provider Last Rate Last Dose  . 0.9 %  sodium chloride infusion   Intravenous Continuous Belkys A Regalado, MD      . ALPRAZolam Prudy Feeler) tablet 0.25 mg  0.25 mg Oral BID PRN Kalman Shan, MD      . ALPRAZolam Prudy Feeler) tablet 1 mg  1 mg Oral QHS PRN Belkys A Regalado, MD      . atorvastatin (LIPITOR) tablet 10 mg  10 mg Oral q1800 Houston Siren, MD      . Melene Muller ON 05/01/2014] Chlorhexidine Gluconate Cloth 2 % PADS 6 each  6 each Topical Q0600 Belkys A Regalado, MD      . fentaNYL (SUBLIMAZE) injection 25-100 mcg  25-100 mcg Intravenous Q2H PRN Kalman Shan, MD      . fluticasone (FLONASE) 50 MCG/ACT nasal spray 2 spray  2 spray Each Nare Daily Houston Siren, MD   2 spray at 04/30/14 0948  . furosemide (LASIX) injection 40 mg  40 mg Intravenous Daily Houston Siren, MD   40 mg at  04/30/14 0930  . heparin ADULT infusion 100 units/mL (25000 units/250 mL)  800 Units/hr Intravenous Continuous Rande Lawman Rumbarger, RPH      . imipenem-cilastatin (PRIMAXIN) 250 mg in sodium chloride 0.9 % 100 mL IVPB  250 mg Intravenous 4 times per day Belkys A Regalado, MD   250 mg at 04/30/14 1158  . ipratropium (ATROVENT) nebulizer solution 0.5 mg  0.5 mg Nebulization Q6H Belkys A Regalado, MD   0.5 mg at 04/30/14 1257  . levalbuterol (XOPENEX) nebulizer solution 0.63 mg  0.63 mg Nebulization Q6H Belkys A Regalado, MD   0.63 mg at 04/30/14 1257  . levalbuterol (XOPENEX) nebulizer solution 0.63 mg  0.63 mg Nebulization Q2H PRN Tammy S Parrett, NP      . levothyroxine (SYNTHROID, LEVOTHROID) tablet 50 mcg  50 mcg Oral QAC breakfast Orvan Falconer, MD   50 mcg at 04/30/14 0931  . methylPREDNISolone sodium succinate (SOLU-MEDROL) 40 mg/mL injection 40 mg  40 mg Intravenous Q12H Tammy S Parrett, NP      . metoprolol (LOPRESSOR) injection 5 mg  5 mg Intravenous Q6H PRN Belkys A Regalado, MD      . montelukast (SINGULAIR) tablet 10 mg  10 mg Oral QHS Orvan Falconer, MD      . mupirocin ointment (BACTROBAN) 2 % 1 application  1 application Nasal BID Belkys A Regalado, MD   1 application at 81/44/81 1245  . [START ON 05/01/2014] pantoprazole (PROTONIX) EC tablet 40 mg  40 mg Oral Daily Tammy S Parrett, NP      . sertraline (ZOLOFT) tablet 100 mg  100 mg Oral QHS Orvan Falconer, MD      . sodium chloride 0.9 % injection 3 mL  3 mL Intravenous Q12H Orvan Falconer, MD   3 mL at 04/30/14 0931  . vancomycin (VANCOCIN) IVPB 750 mg/150 ml premix  750 mg Intravenous Q12H Belkys A Regalado, MD        Prescriptions prior to admission  Medication Sig Dispense Refill  . acetaminophen (TYLENOL) 325 MG tablet Take 650 mg by mouth every 6 (six) hours as needed. For pain/fever      . albuterol (PROVENTIL HFA;VENTOLIN HFA) 108 (90 BASE) MCG/ACT inhaler Inhale 2 puffs into the lungs every 6 (six) hours as needed for wheezing.  1 Inhaler  4  . ALPRAZolam (XANAX) 0.5 MG tablet Take 1 mg by mouth at bedtime as needed for anxiety.      . Ascorbic Acid (VITAMIN  C PO) Take 1 tablet by mouth every morning.      . Calcium Carbonate-Vitamin D (CALCIUM 600+D) 600-400 MG-UNIT per tablet Take 1 tablet by mouth daily at 6 PM.       . cholecalciferol (VITAMIN D) 1000 UNITS tablet Take 1,000 Units by mouth 2 (two) times daily with breakfast and lunch.       . cyclobenzaprine (FLEXERIL) 10 MG tablet Take 10 mg by mouth 3 (three) times daily as needed for muscle spasms.      Marland Kitchen dextromethorphan-guaiFENesin (MUCINEX DM) 30-600 MG per 12 hr tablet Take 1 tablet by mouth every 12 (twelve) hours. scheduled      . dicyclomine (BENTYL) 10 MG capsule Take 10-20 mg by mouth every 6 (six) hours as needed for spasms. 10-20mg  tablets depending on how bad the spasms are.      . fluticasone (FLONASE) 50 MCG/ACT nasal spray Place 2 sprays into both nostrils daily.      . isosorbide mononitrate (IMDUR) 60 MG 24 hr  tablet Take 60 mg by mouth at bedtime.      Marland Kitchen levofloxacin (LEVAQUIN) 500 MG tablet Take 500 mg by mouth daily. 7 day course starting on 04/28/2014      . levothyroxine (SYNTHROID, LEVOTHROID) 25 MCG tablet Take 50 mcg by mouth daily before breakfast.      . loratadine (CLARITIN) 10 MG tablet Take 10 mg by mouth 2 (two) times daily.      . methylPREDNISolone sodium succinate (SOLU-MEDROL) 40 mg/mL injection Inject 80 mg into the vein once.      . metoprolol (LOPRESSOR) 50 MG tablet Take 50 mg by mouth 2 (two) times daily.      . montelukast (SINGULAIR) 10 MG tablet Take 10 mg by mouth at bedtime.      . Multiple Vitamin (MULTIVITAMIN WITH MINERALS) TABS Take 1 tablet by mouth daily.      Marland Kitchen omega-3 acid ethyl esters (LOVAZA) 1 G capsule Take 2 g by mouth 2 (two) times daily.      Marland Kitchen omeprazole-sodium bicarbonate (ZEGERID) 40-1100 MG per capsule Take 1 capsule by mouth daily before breakfast.      . ondansetron (ZOFRAN) 4 MG tablet Take 4 mg by mouth every 8 (eight) hours as needed for nausea or vomiting.      Marland Kitchen oxyCODONE (OXY IR/ROXICODONE) 5 MG immediate release tablet Take  one tablet by mouth every 4 hours as needed for moderate to severe pain  180 tablet  0  . predniSONE (DELTASONE) 20 MG tablet Take 60 mg by mouth daily. 3 day course starting on 04/28/2014      . rivaroxaban (XARELTO) 20 MG TABS tablet Take 1 tablet (20 mg total) by mouth daily with supper.  30 tablet  0  . rosuvastatin (CRESTOR) 20 MG tablet Take 20 mg by mouth at bedtime.      . sertraline (ZOLOFT) 100 MG tablet Take 100 mg by mouth at bedtime.      . traMADol (ULTRAM) 50 MG tablet Take 50 mg by mouth every 6 (six) hours as needed for moderate pain.       Marland Kitchen VITAMIN E PO Take 1 capsule by mouth every morning.      Marland Kitchen albuterol (PROVENTIL) (2.5 MG/3ML) 0.083% nebulizer solution Take 2.5 mg by nebulization every 6 (six) hours as needed for wheezing or shortness of breath.      . denosumab (PROLIA) 60 MG/ML SOLN injection Inject 60 mg into the skin every 6 (six) months. Administer in upper arm, thigh, or abdomen      . nitroGLYCERIN (NITROSTAT) 0.4 MG SL tablet Place 0.4 mg under the tongue every 5 (five) minutes as needed. For chest pain        Family History  Problem Relation Age of Onset  . Prostate cancer Brother   . Heart disease Mother   . Emphysema Sister   . Asthma Mother      Review of Systems:     Cardiac Review of Systems: Y or N  Chest Pain [ y   ]  Resting SOB [  y ] Exertional SOB  Blue.Reese  ]  Orthopnea [ n ]   Pedal Edema [ y  ]    Palpitations [ y ] Syncope  [ n ]   Presyncope [  n ]  General Review of Systems: [Y] = yes [  ]=no Constitional: recent weight change [  ]; anorexia [  ]; fatigue [ y ]; nausea [  ]; night sweats [  ];  fever [ y ]; or chills Blue.Reese  ]                                                               Dental: poor dentition[  n; Last Dentist visit:   Eye : blurred vision [  ]; diplopia [   ]; vision changes [  ];  Amaurosis fugax[ n ]; Resp: cough [ y ];  wheezing[ y ];  hemoptysis[ n ]; shortness of breath[y  ]; paroxysmal nocturnal dyspnea[ y ]; dyspnea on  exertion[ y ]; or orthopnea[  ];  GI:  gallstones[  ], vomiting[  ];  dysphagia[  ]; melena[  ];  hematochezia [  ]; heartburn[  ];   Hx of  Colonoscopy[  ]; GU: kidney stones [  ]; hematuria[ n ];   dysuria [  ];  nocturia[  ];  history of     obstruction [  ]; urinary frequency [  ]             Skin: rash, swelling[  ];, hair loss[  ];  peripheral edema[  ];  or itching[  ]; Musculosketetal: myalgias[  ];  joint swelling[  ];  joint erythema[  ];  joint pain[  ];  back pain[  ];  Heme/Lymph: bruising[  ];  bleeding[  ];  anemia[  ];  Neuro: TIA[ n ];  headaches[  ];  stroke[n  ];  vertigo[  ];  seizures[ n ];   paresthesias[  ];  difficulty walking[ y ];  Psych:depression[  ]; anxiety[  ];  Endocrine: diabetes[  ];  thyroid dysfunction[  ];  Immunizations: Flu Blue.Reese  ]; Pneumococcal[ y ];  Other:  Physical Exam: BP 116/94  Pulse 80  Temp(Src) 98.2 F (36.8 C) (Oral)  Resp 18  Ht 5' (1.524 m)  Wt 152 lb 12.5 oz (69.3 kg)  BMI 29.84 kg/m2  SpO2 94%  General appearance: alert, cooperative and mild distress Neurologic: intact Heart: regular rate and rhythm, S1, S2 normal, no murmur, click, rub or gallop Lungs: diminished breath sounds RLL and RML, dullness to percussion RLL and RML and wheezes RLL and RML Abdomen: soft, non-tender; bowel sounds normal; no masses,  no organomegaly Extremities: rt lower leg cast   Diagnostic Studies & Laboratory data:     Recent Radiology Findings:   Ct Angio Chest Pe W/cm &/or Wo Cm  04/30/2014   CLINICAL DATA:  Recent ankle fracture, back pain, shortness of breath.  EXAM: CT ANGIOGRAPHY CHEST WITH CONTRAST  TECHNIQUE: Multidetector CT imaging of the chest was performed using the standard protocol during bolus administration of intravenous contrast. Multiplanar CT image reconstructions and MIPs were obtained to evaluate the vascular anatomy.  CONTRAST:  122mL OMNIPAQUE IOHEXOL 350 MG/ML SOLN  COMPARISON:  DG CHEST 1V PORT dated 04/29/2014  FINDINGS:  Suboptimal contrast opacification of the main pulmonary artery (220 Hounsfield units), limiting assessment for subsegmental pulmonary embolism, no pulmonary arterial filling defects seen to the segmental branches. Main pulmonary artery is 2.8 cm in transaxial dimension.  The heart appears moderately enlarged, Coronary artery calcifications present. Pericardium is nonsuspicious. Thoracic aorta is normal in course and caliber with moderate calcific atherosclerosis. No lymphadenopathy by CT size criteria.  Moderate right pleural effusion, lobulated in  appearance could reflect loculation. Hypoenhancing right lower lobe consolidation. Trace left pleural effusion. Pulmonary vasculature engorgement. No pneumothorax. Mild effacement of the right lower lobe bronchi.  Included view of the abdomen is unremarkable. Severe degenerative change of the right shoulder with tiny calcifications which could reflect loose bodies. Moderate degenerative change of the thoracic spine, motion through the lower thoracic level results in spurious elongation of a single thoracic vertebra.  Review of the MIP images confirms the above findings.  IMPRESSION: Suboptimal contrast bolus timing with no convincing evidence of pulmonary arterial embolism to at least the segmental branches.  Moderate right pleural effusion, possibly loculated, with hypoenhancing underlying lung suggesting atelectasis and superimposed pneumonia.  Cardiomegaly and pulmonary vasculature congestion.   Electronically Signed   By: Elon Alas   On: 04/30/2014 01:55   Dg Chest Port 1 View  04/29/2014   CLINICAL DATA:  Shortness of breath and cough with history of bronchitis  EXAM: PORTABLE CHEST - 1 VIEW  COMPARISON:  DG CHEST 1V PORT dated 04/01/2014  FINDINGS: There is new increased density in the right hemi thorax consistent with a pleural effusion. The hemidiaphragm is obscured. On the left the lung is well-expanded. The interstitial markings are mildly increased.  The cardiac silhouette is enlarged and the pulmonary vascularity is engorged. The trachea is midline. There are degenerative changes of both shoulders.  IMPRESSION: The findings are consistent with congestive heart failure with pulmonary interstitial edema. A moderate size right pleural effusion is suspected.   Electronically Signed   By: David  Martinique   On: 04/29/2014 23:33    Cardiac Cath : DATE OF PROCEDURE: 05/04/2009  CARDIAC CATHETERIZATION  INDICATIONS: A 78 year old patient with previous stent to the ramus.  PROCEDURE: Cine catheterization done with 5-French catheters from right  femoral artery.  FINDINGS: Left main coronary was normal.  Left anterior descending artery had 20-30% multiple lesions in the  proximal and mid vessel. Distal vessel was small. First and second  diagonal branches were small without critical disease.  The ramus branch was widely patent. There was 20-30% in-stent  restenosis.  Circumflex coronary artery was nondominant. There was 30-40% mid  circumflex disease.  Right coronary artery was dominant. It was normal. There was a single  PDA and a small posterolateral branch.  RAO ventriculography: RAO ventriculography was normal. EF was 60%.  There was no gradient across the aortic valve and no MR. LV pressure  was 150/5. Aortic pressure was 150/80.  IMPRESSION: The patient's pain would appear to be noncardiac in  etiology. She has significant esophageal disease and is followed by Dr.  Henrene Pastor. We will ask GI to see her here in the hospital.  Collier Salina C. Johnsie Cancel, MD, [facility]    Recent Lab Findings: Lab Results  Component Value Date   WBC 24.6* 04/30/2014   HGB 13.0 04/30/2014   HCT 36.9 04/30/2014   PLT 257 04/30/2014   GLUCOSE 201* 04/30/2014   CHOL 178 01/13/2014   TRIG 116 01/13/2014   HDL 102 01/13/2014   LDLCALC 53 01/13/2014   ALT 18 01/13/2014   AST 18 01/13/2014   NA 128* 04/30/2014   K 3.7 04/30/2014   CL 87* 04/30/2014   CREATININE 0.56 04/30/2014   BUN 14  04/30/2014   CO2 24 04/30/2014   TSH 3.170 01/13/2014   INR 3.0 03/17/2013      Assessment / Plan:     Hospital/ health care facility acquired right lower lobe pneumonia with associated pleural effusion moderate in  size. Patient currently on rivaroxaban  ( last dose last night)and heparin. Would suggest when the effects rivaroxaban have abated proceed with ultrasound-guided thoracentesis. If the fluid is consistent with empyema we'll consider placement of chest tube. Patient may eat tonight  [name] MD      [address] [place] [address] Office [phone]   Beeper [id number]  04/30/2014 5:37 PM

## 2014-04-30 NOTE — Progress Notes (Signed)
ANTIBIOTIC CONSULT NOTE - INITIAL  Pharmacy Consult for Vancomycin / Primaxin Indication: pneumonia  Allergies  Allergen Reactions  . Aspirin Other (See Comments)    REACTION: regular strength causes "heart to beat fast"  . Captopril Hypertension  . Naproxen Other (See Comments)    Tongue swelling  . Penicillins     Swelling around site  . Sulfonamide Derivatives Hives  . Clindamycin/Lincomycin     Patient Measurements: Height: 5\' 1"  (154.9 cm) Weight: 153 lb (69.4 kg) IBW/kg (Calculated) : 47.8 Adjusted Body Weight:   Vital Signs: Temp: 99.1 F (37.3 C) (05/15 2218) Temp src: Oral (05/15 2218) BP: 120/76 mmHg (05/16 0900) Pulse Rate: 101 (05/16 0900) Intake/Output from previous day: 05/15 0701 - 05/16 0700 In: -  Out: 300 [Urine:300] Intake/Output from this shift: Total I/O In: 700 [Other:700] Out: -   Labs:  Recent Labs  04/29/14 2248 04/30/14 0525  WBC 25.2* 24.6*  HGB 12.9 13.0  PLT 220 257  CREATININE 0.59 0.56   Estimated Creatinine Clearance: 53.3 ml/min (by C-G formula based on Cr of 0.56). No results found for this basename: VANCOTROUGH, VANCOPEAK, VANCORANDOM, GENTTROUGH, GENTPEAK, GENTRANDOM, TOBRATROUGH, TOBRAPEAK, TOBRARND, AMIKACINPEAK, AMIKACINTROU, AMIKACIN,  in the last 72 hours   Microbiology: No results found for this or any previous visit (from the past 720 hour(s)).  Medical History: Past Medical History  Diagnosis Date  . GERD (gastroesophageal reflux disease)   . Anxiety disorder   . Arthritis   . CAD (coronary artery disease)     Cath May 2010.  Nonbstructive  . Depression   . Hypothyroid   . Asthmatic bronchitis   . Hypertension     dr Percival Spanish  . OA (osteoarthritis)   . Chronic bronchitis   . 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 Encephalitis     d/t meningitis  . PONV (postoperative nausea and vomiting)     history of cardiac arrest day 1 post surgery  in 2008  . Esophageal motility  disorder   . Atrial fibrillation   . Hyperlipidemia   . Fibromyalgia   . Vitamin D deficiency   . Status post dilation of esophageal narrowing   . Bowel obstruction     blockage    Medications:  Scheduled:  . atorvastatin  10 mg Oral q1800  . calcium-vitamin D  1 tablet Oral q1800  . cholecalciferol  1,000 Units Oral BID WC  . fluticasone  2 spray Each Nare Daily  . furosemide  40 mg Intravenous Daily  . imipenem-cilastatin  250 mg Intravenous 4 times per day  . ipratropium  0.5 mg Nebulization Q6H  . levalbuterol  0.63 mg Nebulization Q6H  . levothyroxine  50 mcg Oral QAC breakfast  . methylPREDNISolone (SOLU-MEDROL) injection  60 mg Intravenous Q12H  . metoprolol  50 mg Oral BID  . montelukast  10 mg Oral QHS  . omega-3 acid ethyl esters  2 g Oral BID  . pantoprazole  80 mg Oral Daily  . rivaroxaban  20 mg Oral Q supper  . sertraline  100 mg Oral QHS  . sodium chloride  3 mL Intravenous Q12H  . vancomycin  1,000 mg Intravenous Once  . vancomycin  750 mg Intravenous Q12H   Assessment: HCAP CrCl 53.3 ml/min, patient weight 69.4 kg Day#1 Vancomycin/Primaxin   Goal of Therapy:  Vancomycin trough level 15-20 mcg/ml  Plan:  Vancomycin 1 GM IV x 1 dose, then 750 mg IV every 12  hours Primaxin 250 mg IV every 6 hours Vancomycin trough at steady state Monitor renal function F/U cultures Labs per protocol  Raevyn Sokol [employer] 04/30/2014,10:01 AM

## 2014-04-30 NOTE — Discharge Summary (Signed)
Physician Discharge Summary  [name] NWG:[id number] DOB: 1935/12/22 DOA: 04/29/2014  PCP: Redge Gainer, MD  Admit date: 04/29/2014 Discharge date: 04/30/2014  Time spent: 35 minutes   Discharge Diagnoses:  Principal Problem:   CHF (congestive heart failure) Active Problems:   HYPOTHYROIDISM   HYPERLIPIDEMIA   ATRIAL FIBRILLATION, PAROXYSMAL   REACTIVE AIRWAY DISEASE   Nonunion of subtalar arthrodesis   Filed Weights   04/29/14 2218  Weight: 69.4 kg (153 lb)    History of present illness:  [name] is an 78 y.o. female with hx of afib on Xarelto and betablocker, known CAD, fibromyalgia, HTN, recent right ankle surgery on right ankle, asthma, COPD, presented to the ER with persistent shortness of breath not responding to neb tx. She denied substernal Chest pain, but had some pain in the right posterior ribs. She has no fever, chills, nausea or vomiting. She has no edema, but had some orthopnea. Evaluation in the ER included a CXR which showed vasuclar congestion and interstitial edema, BNP of 1774, negative troponin, and EKG showed afib with RVR of 140. She was given NTP, IV Lasix, and 10mg  of Cardiazem IV, felt markedly better. Hospitalist was asked to admit her for further work up and Tx.   Hospital Course:  Patient admitted with respiratory failure, PNA. CT showed pleural effusion question of loculation. Dr Luan Pulling consulted. Dr Luan Pulling discussed care with Cardiothoracic surgeon who recommend for patient to be transfer to [facility] for further evaluation. CCM, Dr Chase Caller accepted patient in transfer.   1-Acute Respiratory failure, Hypoxic; Multifactorial secondary to PNA, COPD exacerbation, Heart Failure.  Will order schedule nebulizer treatments: ipratropium, Xopenex.  Will continue with IV solumedrol.  Will Continue with IV lasix.  Will start Vancomycin and zosyn. CT show probably right pleural effusion loculated.  Will consult pulmonary due to respiratory failure  and right side probably loculated pleural effussion.  Will check, no ECHO on system.  2-A fib with RVR; HR now 100. Continue with metoprolol oral. Will add PRN lopressor. Continue with xarelto.  If need could consider Cardizem Gtt if BP allows it. Will hold Imdur to avoid hypotension. Might need to hold Xarelto if chest tube is planned.   3-Acute COPD exacerbation. See problem Number 1.  4-Acute Heart Failure; continue with IV lasix. Will order, ECHO.  5-PNA; will treat for [place]; Start Vancomycin and Zosyn. Will order blood culture.  6-Hypothyroidism; continue with synthroid.  7-Hyponatremia; monitor. Improving.    Procedures:  none  Consultations:  Dr Luan Pulling.   Discharge Exam: Filed Vitals:   04/30/14 1000  BP: 111/74  Pulse: 102  Temp:   Resp: 26    General: No Respiratory Distress.  Cardiovascular: S 1, S 2 IRR Respiratory: bilateral crackle, wheezing.   Discharge Instructions You were cared for by a hospitalist during your hospital stay. If you have any questions about your discharge medications or the care you received while you were in the hospital after you are discharged, you can call the unit and asked to speak with the hospitalist on call if the hospitalist that took care of you is not available. Once you are discharged, your primary care physician will handle any further medical issues. Please note that NO REFILLS for any discharge medications will be authorized once you are discharged, as it is imperative that you return to your primary care physician (or establish a relationship with a primary care physician if you do not have one) for your aftercare needs so  that they can reassess your need for medications and monitor your lab values.     Medication List    ASK your doctor about these medications       acetaminophen 325 MG tablet  Commonly known as:  TYLENOL  Take 650 mg by mouth every 6 (six) hours as needed. For pain/fever     albuterol  (2.5 MG/3ML) 0.083% nebulizer solution  Commonly known as:  PROVENTIL  Take 2.5 mg by nebulization every 6 (six) hours as needed for wheezing or shortness of breath.     albuterol 108 (90 BASE) MCG/ACT inhaler  Commonly known as:  PROVENTIL HFA;VENTOLIN HFA  Inhale 2 puffs into the lungs every 6 (six) hours as needed for wheezing.     ALPRAZolam 0.5 MG tablet  Commonly known as:  XANAX  Take 1 mg by mouth at bedtime as needed for anxiety.     CALCIUM 600+D 600-400 MG-UNIT per tablet  Generic drug:  Calcium Carbonate-Vitamin D  Take 1 tablet by mouth daily at 6 PM.     cholecalciferol 1000 UNITS tablet  Commonly known as:  VITAMIN D  Take 1,000 Units by mouth 2 (two) times daily with breakfast and lunch.     cyclobenzaprine 10 MG tablet  Commonly known as:  FLEXERIL  Take 10 mg by mouth 3 (three) times daily as needed for muscle spasms.     denosumab 60 MG/ML Soln injection  Commonly known as:  PROLIA  Inject 60 mg into the skin every 6 (six) months. Administer in upper arm, thigh, or abdomen     dextromethorphan-guaiFENesin 30-600 MG per 12 hr tablet  Commonly known as:  MUCINEX DM  Take 1 tablet by mouth every 12 (twelve) hours. scheduled     dicyclomine 10 MG capsule  Commonly known as:  BENTYL  Take 10-20 mg by mouth every 6 (six) hours as needed for spasms. 10-20mg  tablets depending on how bad the spasms are.     fluticasone 50 MCG/ACT nasal spray  Commonly known as:  FLONASE  Place 2 sprays into both nostrils daily.     isosorbide mononitrate 60 MG 24 hr tablet  Commonly known as:  IMDUR  Take 60 mg by mouth at bedtime.     levofloxacin 500 MG tablet  Commonly known as:  LEVAQUIN  Take 500 mg by mouth daily. 7 day course starting on 04/28/2014     levothyroxine 25 MCG tablet  Commonly known as:  SYNTHROID, LEVOTHROID  Take 50 mcg by mouth daily before breakfast.     loratadine 10 MG tablet  Commonly known as:  CLARITIN  Take 10 mg by mouth 2 (two) times  daily.     methylPREDNISolone sodium succinate 40 mg/mL injection  Commonly known as:  SOLU-MEDROL  Inject 80 mg into the vein once.     metoprolol 50 MG tablet  Commonly known as:  LOPRESSOR  Take 50 mg by mouth 2 (two) times daily.     montelukast 10 MG tablet  Commonly known as:  SINGULAIR  Take 10 mg by mouth at bedtime.     multivitamin with minerals Tabs tablet  Take 1 tablet by mouth daily.     nitroGLYCERIN 0.4 MG SL tablet  Commonly known as:  NITROSTAT  Place 0.4 mg under the tongue every 5 (five) minutes as needed. For chest pain     omega-3 acid ethyl esters 1 G capsule  Commonly known as:  LOVAZA  Take 2 g by mouth  2 (two) times daily.     omeprazole-sodium bicarbonate 40-1100 MG per capsule  Commonly known as:  ZEGERID  Take 1 capsule by mouth daily before breakfast.     ondansetron 4 MG tablet  Commonly known as:  ZOFRAN  Take 4 mg by mouth every 8 (eight) hours as needed for nausea or vomiting.     oxyCODONE 5 MG immediate release tablet  Commonly known as:  Oxy IR/ROXICODONE  Take one tablet by mouth every 4 hours as needed for moderate to severe pain     predniSONE 20 MG tablet  Commonly known as:  DELTASONE  Take 60 mg by mouth daily. 3 day course starting on 04/28/2014     rivaroxaban 20 MG Tabs tablet  Commonly known as:  XARELTO  Take 1 tablet (20 mg total) by mouth daily with supper.     rosuvastatin 20 MG tablet  Commonly known as:  CRESTOR  Take 20 mg by mouth at bedtime.     sertraline 100 MG tablet  Commonly known as:  ZOLOFT  Take 100 mg by mouth at bedtime.     traMADol 50 MG tablet  Commonly known as:  ULTRAM  Take 50 mg by mouth every 6 (six) hours as needed for moderate pain.     VITAMIN C PO  Take 1 tablet by mouth every morning.     VITAMIN E PO  Take 1 capsule by mouth every morning.       Allergies  Allergen Reactions  . Aspirin Other (See Comments)    REACTION: regular strength causes "heart to beat fast"  .  Captopril Hypertension  . Naproxen Other (See Comments)    Tongue swelling  . Penicillins     Swelling around site  . Sulfonamide Derivatives Hives  . Clindamycin/Lincomycin       The results of significant diagnostics from this hospitalization (including imaging, microbiology, ancillary and laboratory) are listed below for reference.    Significant Diagnostic Studies: Dg Ankle Complete Right  03/31/2014   CLINICAL DATA:  Fracture with subtalar arthrodesis.  Revision.  EXAM: RIGHT ANKLE - COMPLETE 3+ VIEW; DG C-ARM 1-60 MIN  COMPARISON:  None.  FINDINGS: C-arm films document revision of subtalar arthrodesis. Intramedullary tibial rod has been removed.  IMPRESSION: No adverse features.   Electronically Signed   By: Rolla Flatten M.D.   On: 03/31/2014 16:42   Ct Angio Chest Pe W/cm &/or Wo Cm  04/30/2014   CLINICAL DATA:  Recent ankle fracture, back pain, shortness of breath.  EXAM: CT ANGIOGRAPHY CHEST WITH CONTRAST  TECHNIQUE: Multidetector CT imaging of the chest was performed using the standard protocol during bolus administration of intravenous contrast. Multiplanar CT image reconstructions and MIPs were obtained to evaluate the vascular anatomy.  CONTRAST:  184mL OMNIPAQUE IOHEXOL 350 MG/ML SOLN  COMPARISON:  DG CHEST 1V PORT dated 04/29/2014  FINDINGS: Suboptimal contrast opacification of the main pulmonary artery (220 Hounsfield units), limiting assessment for subsegmental pulmonary embolism, no pulmonary arterial filling defects seen to the segmental branches. Main pulmonary artery is 2.8 cm in transaxial dimension.  The heart appears moderately enlarged, Coronary artery calcifications present. Pericardium is nonsuspicious. Thoracic aorta is normal in course and caliber with moderate calcific atherosclerosis. No lymphadenopathy by CT size criteria.  Moderate right pleural effusion, lobulated in appearance could reflect loculation. Hypoenhancing right lower lobe consolidation. Trace left  pleural effusion. Pulmonary vasculature engorgement. No pneumothorax. Mild effacement of the right lower lobe bronchi.  Included view of  the abdomen is unremarkable. Severe degenerative change of the right shoulder with tiny calcifications which could reflect loose bodies. Moderate degenerative change of the thoracic spine, motion through the lower thoracic level results in spurious elongation of a single thoracic vertebra.  Review of the MIP images confirms the above findings.  IMPRESSION: Suboptimal contrast bolus timing with no convincing evidence of pulmonary arterial embolism to at least the segmental branches.  Moderate right pleural effusion, possibly loculated, with hypoenhancing underlying lung suggesting atelectasis and superimposed pneumonia.  Cardiomegaly and pulmonary vasculature congestion.   Electronically Signed   By: Elon Alas   On: 04/30/2014 01:55   Dg Chest Port 1 View  04/29/2014   CLINICAL DATA:  Shortness of breath and cough with history of bronchitis  EXAM: PORTABLE CHEST - 1 VIEW  COMPARISON:  DG CHEST 1V PORT dated 04/01/2014  FINDINGS: There is new increased density in the right hemi thorax consistent with a pleural effusion. The hemidiaphragm is obscured. On the left the lung is well-expanded. The interstitial markings are mildly increased. The cardiac silhouette is enlarged and the pulmonary vascularity is engorged. The trachea is midline. There are degenerative changes of both shoulders.  IMPRESSION: The findings are consistent with congestive heart failure with pulmonary interstitial edema. A moderate size right pleural effusion is suspected.   Electronically Signed   By: David  Martinique   On: 04/29/2014 23:33   Dg Chest Port 1 View  04/01/2014   CLINICAL DATA:  New onset of cough.  EXAM: PORTABLE CHEST - 1 VIEW  COMPARISON:  Chest x-ray 12/30/2013.  FINDINGS: Lung volumes are normal. No consolidative airspace disease. No pleural effusions. No evidence of pulmonary edema.  Mild cardiomegaly. Upper mediastinal contours are slightly distorted by patient's rotation to the right. Atherosclerotic calcifications within the thoracic aorta.  IMPRESSION: 1. No radiographic evidence of acute cardiopulmonary disease. 2. Mild cardiomegaly. 3. Atherosclerosis.   Electronically Signed   By: Vinnie Langton M.D.   On: 04/01/2014 18:40   Dg C-arm 1-60 Min  03/31/2014   CLINICAL DATA:  Fracture with subtalar arthrodesis.  Revision.  EXAM: RIGHT ANKLE - COMPLETE 3+ VIEW; DG C-ARM 1-60 MIN  COMPARISON:  None.  FINDINGS: C-arm films document revision of subtalar arthrodesis. Intramedullary tibial rod has been removed.  IMPRESSION: No adverse features.   Electronically Signed   By: Rolla Flatten M.D.   On: 03/31/2014 16:42    Microbiology: Recent Results (from the past 240 hour(s))  CULTURE, BLOOD (ROUTINE X 2)     Status: None   Collection Time    04/30/14  9:32 AM      Result Value Ref Range Status   Specimen Description BLOOD RIGHT ARM   Final   Special Requests BOTTLES DRAWN AEROBIC ONLY 8CC BOTTLE   Final   Culture PENDING   Incomplete   Report Status PENDING   Incomplete  CULTURE, BLOOD (ROUTINE X 2)     Status: None   Collection Time    04/30/14  9:49 AM      Result Value Ref Range Status   Specimen Description BLOOD LEFT ARM   Final   Special Requests     Final   Value: BOTTLES DRAWN AEROBIC AND ANAEROBIC 6CC EACH BOTTLE   Culture PENDING   Incomplete   Report Status PENDING   Incomplete     Labs: Basic Metabolic Panel:  Recent Labs Lab 04/29/14 2248 04/30/14 0525  NA 126* 128*  K 4.1 3.7  CL 86* 87*  CO2 24 24  GLUCOSE 133* 201*  BUN 17 14  CREATININE 0.59 0.56  CALCIUM 8.7 8.3*   Liver Function Tests: No results found for this basename: AST, ALT, ALKPHOS, BILITOT, PROT, ALBUMIN,  in the last 168 hours No results found for this basename: LIPASE, AMYLASE,  in the last 168 hours No results found for this basename: AMMONIA,  in the last 168  hours CBC:  Recent Labs Lab 04/29/14 2248 04/30/14 0525  WBC 25.2* 24.6*  NEUTROABS 22.5*  --   HGB 12.9 13.0  HCT 35.4* 36.9  MCV 93.4 91.1  PLT 220 257   Cardiac Enzymes: No results found for this basename: CKTOTAL, CKMB, CKMBINDEX, TROPONINI,  in the last 168 hours BNP: BNP (last 3 results)  Recent Labs  04/29/14 2248  PROBNP 1774.0*   CBG: No results found for this basename: GLUCAP,  in the last 168 hours     Signed:  Norval Slaven A Makinze Jani  [facility] 04/30/2014, 10:38 AM

## 2014-04-30 NOTE — H&P (Signed)
[facility] History and Physical  BRILEY BUMGARNER GNF:[id number] DOB: 1936-12-05    PCP:   Redge Gainer, MD   Chief Complaint: shortness of breath and palpitation.  HPI: [name] is an 78 y.o. female with hx of afib on Xarelto and betablocker, known CAD, fibromyalgia, HTN, recent right ankle surgery on right ankle, asthma, COPD, presented to the ER with persistent shortness of breath not responding to neb tx.  She denied substernal Chest pain, but had some pain in the right posterior ribs.  She has no fever, chills, nausea or vomiting.  She has no edema, but had some orthopnea. Evaluation in the ER included a CXR which showed vasuclar congestion and interstitial edema, BNP of 1774, negative troponin, and EKG showed afib with RVR of 140.  She was given NTP, IV Lasix, and 10mg  of Cardiazem IV, felt markedly better.  Hospitalist was asked to admit her for further work up and Tx.  Rewiew of Systems:  Constitutional: Negative for malaise, fever and chills. No significant weight loss or weight gain Eyes: Negative for eye pain, redness and discharge, diplopia, visual changes, or flashes of light. ENMT: Negative for ear pain, hoarseness, nasal congestion, sinus pressure and sore throat. No headaches; tinnitus, drooling, or problem swallowing. Cardiovascular: Negative for chest pain, palpitations, diaphoresis,  ; No orthopnea, PND Respiratory: Negative for cough, hemoptysis, wheezing and stridor. No pleuritic chestpain. Gastrointestinal: Negative for nausea, vomiting, diarrhea, constipation, abdominal pain, melena, blood in stool, hematemesis, jaundice and rectal bleeding.    Genitourinary: Negative for frequency, dysuria, incontinence,flank pain and hematuria; Musculoskeletal: Negative for back pain and neck pain. Negative for swelling and trauma.;  Skin: . Negative for pruritus, rash, abrasions, bruising and skin lesion.; ulcerations Neuro: Negative for headache, lightheadedness and neck  stiffness. Negative for weakness, altered level of consciousness , altered mental status, extremity weakness, burning feet, involuntary movement, seizure and syncope.  Psych: negative for anxiety, depression, insomnia, tearfulness, panic attacks, hallucinations, paranoia, suicidal or homicidal ideation    Past Medical History  Diagnosis Date  . GERD (gastroesophageal reflux disease)   . Anxiety disorder   . Arthritis   . CAD (coronary artery disease)     Cath May 2010.  Nonbstructive  . Depression   . Hypothyroid   . Asthmatic bronchitis   . Hypertension     dr Percival Spanish  . OA (osteoarthritis)   . Chronic bronchitis   . 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 Encephalitis     d/t meningitis  . PONV (postoperative nausea and vomiting)     history of cardiac arrest day 1 post surgery  in 2008  . Esophageal motility disorder   . Atrial fibrillation   . Hyperlipidemia   . Fibromyalgia   . Vitamin D deficiency   . Status post dilation of esophageal narrowing   . Bowel obstruction     blockage    Past Surgical History  Procedure Laterality Date  . Total knee arthroplasty Right   . Back surgery    . Coronary angioplasty with stent placement  2003  . Sinus surgery with instatrak    . Knee arthroscopy  03/26/2012    Procedure: ARTHROSCOPY KNEE;  Surgeon: Wylene Simmer, MD;  Location: [place];  Service: Orthopedics;  Laterality: Left;  with Debridement of Lateral Meniscus tear  . Rotator cuff repair Bilateral   . Ankle fusion  08/27/2012    Procedure: ARTHRODESIS ANKLE;  Surgeon: Wylene Simmer, MD;  Location: [place];  Service: Orthopedics;  Laterality: Right;  Arthrodesis right ankle and subtalar joint  . Foot arthrodesis, subtalar Right 2013  . Removal of implant Right 03/31/2014    DR HEWITT  . Hardware revision  03/31/2014    SUBTALOR  ARTHRODESIS       DR HEWITT  . Hardware removal Right 03/31/2014    Procedure: REMOVAL OF DEEP IMPLANTS X 3  RIGHT ;   Surgeon: Wylene Simmer, MD;  Location: [place];  Service: Orthopedics;  Laterality: Right;  . Arthrodesis tibiofibular Right 03/31/2014    Procedure: REVISION OF SUBTALOR ARTHRODESIS  RIGHT ;  Surgeon: Wylene Simmer, MD;  Location: [place];  Service: Orthopedics;  Laterality: Right;    Medications:  HOME MEDS: Prior to Admission medications   Medication Sig Start Date End Date Taking? Authorizing Provider  acetaminophen (TYLENOL) 325 MG tablet Take 650 mg by mouth every 6 (six) hours as needed. For pain/fever   Yes Historical Provider, MD  albuterol (PROVENTIL HFA;VENTOLIN HFA) 108 (90 BASE) MCG/ACT inhaler Inhale 2 puffs into the lungs every 6 (six) hours as needed for wheezing. 09/06/13  Yes Chipper Herb, MD  ALPRAZolam Duanne Moron) 0.5 MG tablet Take 1 mg by mouth at bedtime as needed for anxiety.   Yes Historical Provider, MD  Ascorbic Acid (VITAMIN C PO) Take 1 tablet by mouth every morning.   Yes Historical Provider, MD  Calcium Carbonate-Vitamin D (CALCIUM 600+D) 600-400 MG-UNIT per tablet Take 1 tablet by mouth daily at 6 PM.    Yes Historical Provider, MD  cholecalciferol (VITAMIN D) 1000 UNITS tablet Take 1,000 Units by mouth 2 (two) times daily with breakfast and lunch.    Yes Historical Provider, MD  cyclobenzaprine (FLEXERIL) 10 MG tablet Take 10 mg by mouth 3 (three) times daily as needed for muscle spasms.   Yes Historical Provider, MD  dextromethorphan-guaiFENesin (MUCINEX DM) 30-600 MG per 12 hr tablet Take 1 tablet by mouth every 12 (twelve) hours. scheduled   Yes Historical Provider, MD  dicyclomine (BENTYL) 10 MG capsule Take 10-20 mg by mouth every 6 (six) hours as needed for spasms. 10-20mg  tablets depending on how bad the spasms are.   Yes Historical Provider, MD  fluticasone (FLONASE) 50 MCG/ACT nasal spray Place 2 sprays into both nostrils daily.   Yes Historical Provider, MD  isosorbide mononitrate (IMDUR) 60 MG 24 hr tablet Take 60 mg by mouth at bedtime.   Yes Historical Provider,  MD  levofloxacin (LEVAQUIN) 500 MG tablet Take 500 mg by mouth daily. 7 day course starting on 04/28/2014   Yes Historical Provider, MD  levothyroxine (SYNTHROID, LEVOTHROID) 25 MCG tablet Take 50 mcg by mouth daily before breakfast.   Yes Historical Provider, MD  loratadine (CLARITIN) 10 MG tablet Take 10 mg by mouth 2 (two) times daily.   Yes Historical Provider, MD  methylPREDNISolone sodium succinate (SOLU-MEDROL) 40 mg/mL injection Inject 80 mg into the vein once.   Yes Historical Provider, MD  metoprolol (LOPRESSOR) 50 MG tablet Take 50 mg by mouth 2 (two) times daily.   Yes Historical Provider, MD  montelukast (SINGULAIR) 10 MG tablet Take 10 mg by mouth at bedtime.   Yes Historical Provider, MD  Multiple Vitamin (MULTIVITAMIN WITH MINERALS) TABS Take 1 tablet by mouth daily.   Yes Historical Provider, MD  omega-3 acid ethyl esters (LOVAZA) 1 G capsule Take 2 g by mouth 2 (two) times daily.   Yes Historical Provider, MD  omeprazole-sodium bicarbonate (ZEGERID) 40-1100 MG  per capsule Take 1 capsule by mouth daily before breakfast.   Yes Historical Provider, MD  ondansetron (ZOFRAN) 4 MG tablet Take 4 mg by mouth every 8 (eight) hours as needed for nausea or vomiting.   Yes Historical Provider, MD  oxyCODONE (OXY IR/ROXICODONE) 5 MG immediate release tablet Take one tablet by mouth every 4 hours as needed for moderate to severe pain 04/07/14  Yes Pricilla Larsson, NP  predniSONE (DELTASONE) 20 MG tablet Take 60 mg by mouth daily. 3 day course starting on 04/28/2014   Yes Historical Provider, MD  rivaroxaban (XARELTO) 20 MG TABS tablet Take 1 tablet (20 mg total) by mouth daily with supper. 04/05/14  Yes Wylene Simmer, MD  rosuvastatin (CRESTOR) 20 MG tablet Take 20 mg by mouth at bedtime.   Yes Historical Provider, MD  sertraline (ZOLOFT) 100 MG tablet Take 100 mg by mouth at bedtime.   Yes Historical Provider, MD  traMADol (ULTRAM) 50 MG tablet Take 50 mg by mouth every 6 (six) hours as needed for  moderate pain.  03/04/14  Yes Historical Provider, MD  VITAMIN E PO Take 1 capsule by mouth every morning.   Yes Historical Provider, MD  albuterol (PROVENTIL) (2.5 MG/3ML) 0.083% nebulizer solution Take 2.5 mg by nebulization every 6 (six) hours as needed for wheezing or shortness of breath.    Historical Provider, MD  denosumab (PROLIA) 60 MG/ML SOLN injection Inject 60 mg into the skin every 6 (six) months. Administer in upper arm, thigh, or abdomen    Historical Provider, MD  nitroGLYCERIN (NITROSTAT) 0.4 MG SL tablet Place 0.4 mg under the tongue every 5 (five) minutes as needed. For chest pain    Historical Provider, MD     Allergies:  Allergies  Allergen Reactions  . Aspirin Other (See Comments)    REACTION: regular strength causes "heart to beat fast"  . Captopril Hypertension  . Naproxen Other (See Comments)    Tongue swelling  . Penicillins     Swelling around site  . Sulfonamide Derivatives Hives  . Clindamycin/Lincomycin     Social History:   reports that she quit smoking about 24 years ago. Her smoking use included Cigarettes. She has a 15 pack-year smoking history. She has never used smokeless tobacco. She reports that she does not drink alcohol or use illicit drugs.  Family History: Family History  Problem Relation Age of Onset  . Prostate cancer Brother   . Heart disease Mother   . Emphysema Sister   . Asthma Mother      Physical Exam: Filed Vitals:   04/30/14 0159 04/30/14 0200 04/30/14 0230 04/30/14 0300  BP: 127/76 127/76 135/59 119/61  Pulse: 104 109 108 102  Temp:      TempSrc:      Resp: 23 20 16 21   Height:      Weight:      SpO2: 92% 91% 91% 92%   Blood pressure 119/61, pulse 102, temperature 99.1 F (37.3 C), temperature source Oral, resp. rate 21, height 5\' 1"  (1.549 m), weight 69.4 kg (153 lb), SpO2 92.00%.  GEN:  Pleasant patient lying in the stretcher in no acute distress; cooperative with exam. PSYCH:  alert and oriented x4; does not  appear anxious or depressed; affect is appropriate. HEENT: Mucous membranes pink and anicteric; PERRLA; EOM intact; no cervical lymphadenopathy nor thyromegaly or carotid bruit; no JVD; There were no stridor. Neck is very supple. Breasts:: Not examined CHEST WALL: No tenderness CHEST: Normal respiration, diffuse  wheezing with bisilar rales, and scattered rhonchi. HEART: Irregular and rapid. There are no murmur, rub, or gallops.   BACK: No kyphosis or scoliosis; no CVA tenderness ABDOMEN: soft and non-tender; no masses, no organomegaly, normal abdominal bowel sounds; no pannus; no intertriginous candida. There is no rebound and no distention. Rectal Exam: Not done EXTREMITIES: No bone or joint deformity; age-appropriate arthropathy of the hands and knees; no edema; no ulcerations.  There is no calf tenderness.  Right ankle in cast. Genitalia: not examined PULSES: 2+ and symmetric SKIN: Normal hydration no rash or ulceration CNS: Cranial nerves 2-12 grossly intact no focal lateralizing neurologic deficit.  Speech is fluent; uvula elevated with phonation, facial symmetry and tongue midline. DTR are normal bilaterally, cerebella exam is intact, barbinski is negative and strengths are equaled bilaterally.  No sensory loss.   Labs on Admission:  Basic Metabolic Panel:  Recent Labs Lab 04/29/14 2248  NA 126*  K 4.1  CL 86*  CO2 24  GLUCOSE 133*  BUN 17  CREATININE 0.59  CALCIUM 8.7   CBC:  Recent Labs Lab 04/29/14 2248  WBC 25.2*  NEUTROABS 22.5*  HGB 12.9  HCT 35.4*  MCV 93.4  PLT 220   Cardiac Enzymes: No results found for this basename: CKTOTAL, CKMB, CKMBINDEX, TROPONINI,  in the last 168 hours  CBG: No results found for this basename: GLUCAP,  in the last 168 hours   Radiological Exams on Admission: Ct Angio Chest Pe W/cm &/or Wo Cm  04/30/2014   CLINICAL DATA:  Recent ankle fracture, back pain, shortness of breath.  EXAM: CT ANGIOGRAPHY CHEST WITH CONTRAST   TECHNIQUE: Multidetector CT imaging of the chest was performed using the standard protocol during bolus administration of intravenous contrast. Multiplanar CT image reconstructions and MIPs were obtained to evaluate the vascular anatomy.  CONTRAST:  135mL OMNIPAQUE IOHEXOL 350 MG/ML SOLN  COMPARISON:  DG CHEST 1V PORT dated 04/29/2014  FINDINGS: Suboptimal contrast opacification of the main pulmonary artery (220 Hounsfield units), limiting assessment for subsegmental pulmonary embolism, no pulmonary arterial filling defects seen to the segmental branches. Main pulmonary artery is 2.8 cm in transaxial dimension.  The heart appears moderately enlarged, Coronary artery calcifications present. Pericardium is nonsuspicious. Thoracic aorta is normal in course and caliber with moderate calcific atherosclerosis. No lymphadenopathy by CT size criteria.  Moderate right pleural effusion, lobulated in appearance could reflect loculation. Hypoenhancing right lower lobe consolidation. Trace left pleural effusion. Pulmonary vasculature engorgement. No pneumothorax. Mild effacement of the right lower lobe bronchi.  Included view of the abdomen is unremarkable. Severe degenerative change of the right shoulder with tiny calcifications which could reflect loose bodies. Moderate degenerative change of the thoracic spine, motion through the lower thoracic level results in spurious elongation of a single thoracic vertebra.  Review of the MIP images confirms the above findings.  IMPRESSION: Suboptimal contrast bolus timing with no convincing evidence of pulmonary arterial embolism to at least the segmental branches.  Moderate right pleural effusion, possibly loculated, with hypoenhancing underlying lung suggesting atelectasis and superimposed pneumonia.  Cardiomegaly and pulmonary vasculature congestion.   Electronically Signed   By: Elon Alas   On: 04/30/2014 01:55   Dg Chest Port 1 View  04/29/2014   CLINICAL DATA:  Shortness  of breath and cough with history of bronchitis  EXAM: PORTABLE CHEST - 1 VIEW  COMPARISON:  DG CHEST 1V PORT dated 04/01/2014  FINDINGS: There is new increased density in the right hemi thorax consistent with a  pleural effusion. The hemidiaphragm is obscured. On the left the lung is well-expanded. The interstitial markings are mildly increased. The cardiac silhouette is enlarged and the pulmonary vascularity is engorged. The trachea is midline. There are degenerative changes of both shoulders.  IMPRESSION: The findings are consistent with congestive heart failure with pulmonary interstitial edema. A moderate size right pleural effusion is suspected.   Electronically Signed   By: David  Martinique   On: 04/29/2014 23:33    EKG: Independently reviewed. afib with rapid ventricular rate.   Assessment/Plan Present on Admission:  . CHF (congestive heart failure) . HYPOTHYROIDISM . HYPERLIPIDEMIA . ATRIAL FIBRILLATION, PAROXYSMAL . REACTIVE AIRWAY DISEASE . Nonunion of subtalar arthrodesis  PLAN:  I suspect her SOB is multifactorial including COPD exacerbation, CHF, deconditioned, and afib with RVR.  She had a cast on right ankle, and PE was r/out with CTPA.   Will give IV Cardiazem PRN, continue her meds, continue her Xarelto for afib.  She will be tx for CHF with NTP and IV diuretics.  Will follow her lytes and renal fx tests carefully.  For her COPD, will give one dose of IV solumedrol and give oral prednisone tomorrow, along with continuing her levoquin orally.  She will be given nebs.  She is stable, full code, and will be admtted to SDU under [place].   Thank you for allowing me to participate in her care.  Other plans as per orders.  Code Status: FULL CODE.   Orvan Falconer, MD. [facility] Pager [phone] 7pm to 7am.  04/30/2014, 3:29 AM

## 2014-04-30 NOTE — ED Notes (Signed)
Patient placed on bedpan.

## 2014-04-30 NOTE — Progress Notes (Signed)
Pt transferred via care link to [facility].

## 2014-04-30 NOTE — Progress Notes (Signed)
Pt is very anxious, restless, and works herself up. She complains of pain on her right side, she is worried that we are not elevating her right foot high enough, so another pillow was added, and at the patients request another pillow was tied onto the original with a sheet, she also c/o anxiety, shob, and insomnia. Pt has been given multiple pain medications, and antianxiety medication. Room darkened and music turned on to try and help patient to relax. The more restless she gets, the harder it is for her to breath. Practicing relaxation techniques with patient.  Maree Krabbe RN BSN

## 2014-04-30 NOTE — Progress Notes (Signed)
[facility] PROGRESS NOTE  [name] VOJ:[id number] DOB: Oct 24, 1936 DOA: 04/29/2014 PCP: Redge Gainer, MD  Assessment/Plan: 1-Acute Respiratory failure, Hypoxic; Multifactorial secondary to PNA, COPD exacerbation, Heart Failure.  Will order schedule nebulizer treatments: ipratropium, Xopenex.  Will continue with IV solumedrol.  Will Continue with IV lasix.  Will start Vancomycin and zosyn. CT show probably right pleural effusion loculated.  Will consult pulmonary due to respiratory failure and right side probably loculated pleural effussion.  Will check, no ECHO on system.   2-A fib with RVR; HR now 100. Continue with metoprolol oral. Will add PRN lopressor. Continue with xarelto.  If need could consider Cardizem Gtt if BP allows it. Will hold Imdur to avoid hypotension.   3-Acute COPD exacerbation. See problem Number 1.  4-Acute Heart Failure; continue with IV lasix. Will order, ECHO.  5-PNA; will treat for [place]; Start Vancomycin and Zosyn. Will order blood culture.  6-Hypothyroidism; continue with synthroid.  7-Hyponatremia; monitor. Improving.   Code Status: Full Code.  Family Communication: Care discussed with patient.  Disposition Plan: Remain in the ICU   Consultants:  Pulmonary.   Procedures:  none  Antibiotics:  Vancomycin 5-16  Zosyn 5-16  Levaquin 5-15---5-16  HPI/Subjective: Complaining of right side chest pain. Mild dyspnea. Doesn't feel well.   Objective: Filed Vitals:   04/30/14 0854  BP: 114/84  Pulse: 103  Temp:   Resp:     Intake/Output Summary (Last 24 hours) at 04/30/14 0903 Last data filed at 04/30/14 0800  Gross per 24 hour  Intake    700 ml  Output    300 ml  Net    400 ml   Filed Weights   04/29/14 2218  Weight: 69.4 kg (153 lb)    Exam:   General:  No distress.   Cardiovascular: S 1, S 2 RRR  Respiratory: right side wheezing, bilateral crackles.   Abdomen: BS present, soft, NT.    Musculoskeletal: trace edema.   Data Reviewed: Basic Metabolic Panel:  Recent Labs Lab 04/29/14 2248 04/30/14 0525  NA 126* 128*  K 4.1 3.7  CL 86* 87*  CO2 24 24  GLUCOSE 133* 201*  BUN 17 14  CREATININE 0.59 0.56  CALCIUM 8.7 8.3*   Liver Function Tests: No results found for this basename: AST, ALT, ALKPHOS, BILITOT, PROT, ALBUMIN,  in the last 168 hours No results found for this basename: LIPASE, AMYLASE,  in the last 168 hours No results found for this basename: AMMONIA,  in the last 168 hours CBC:  Recent Labs Lab 04/29/14 2248 04/30/14 0525  WBC 25.2* 24.6*  NEUTROABS 22.5*  --   HGB 12.9 13.0  HCT 35.4* 36.9  MCV 93.4 91.1  PLT 220 257   Cardiac Enzymes: No results found for this basename: CKTOTAL, CKMB, CKMBINDEX, TROPONINI,  in the last 168 hours BNP (last 3 results)  Recent Labs  04/29/14 2248  PROBNP 1774.0*   CBG: No results found for this basename: GLUCAP,  in the last 168 hours  No results found for this or any previous visit (from the past 240 hour(s)).   Studies: Ct Angio Chest Pe W/cm &/or Wo Cm  04/30/2014   CLINICAL DATA:  Recent ankle fracture, back pain, shortness of breath.  EXAM: CT ANGIOGRAPHY CHEST WITH CONTRAST  TECHNIQUE: Multidetector CT imaging of the chest was performed using the standard protocol during bolus administration of intravenous contrast. Multiplanar CT image reconstructions and MIPs were obtained to evaluate the vascular anatomy.  CONTRAST:  158mL OMNIPAQUE IOHEXOL 350 MG/ML SOLN  COMPARISON:  DG CHEST 1V PORT dated 04/29/2014  FINDINGS: Suboptimal contrast opacification of the main pulmonary artery (220 Hounsfield units), limiting assessment for subsegmental pulmonary embolism, no pulmonary arterial filling defects seen to the segmental branches. Main pulmonary artery is 2.8 cm in transaxial dimension.  The heart appears moderately enlarged, Coronary artery calcifications present. Pericardium is nonsuspicious. Thoracic  aorta is normal in course and caliber with moderate calcific atherosclerosis. No lymphadenopathy by CT size criteria.  Moderate right pleural effusion, lobulated in appearance could reflect loculation. Hypoenhancing right lower lobe consolidation. Trace left pleural effusion. Pulmonary vasculature engorgement. No pneumothorax. Mild effacement of the right lower lobe bronchi.  Included view of the abdomen is unremarkable. Severe degenerative change of the right shoulder with tiny calcifications which could reflect loose bodies. Moderate degenerative change of the thoracic spine, motion through the lower thoracic level results in spurious elongation of a single thoracic vertebra.  Review of the MIP images confirms the above findings.  IMPRESSION: Suboptimal contrast bolus timing with no convincing evidence of pulmonary arterial embolism to at least the segmental branches.  Moderate right pleural effusion, possibly loculated, with hypoenhancing underlying lung suggesting atelectasis and superimposed pneumonia.  Cardiomegaly and pulmonary vasculature congestion.   Electronically Signed   By: Elon Alas   On: 04/30/2014 01:55   Dg Chest Port 1 View  04/29/2014   CLINICAL DATA:  Shortness of breath and cough with history of bronchitis  EXAM: PORTABLE CHEST - 1 VIEW  COMPARISON:  DG CHEST 1V PORT dated 04/01/2014  FINDINGS: There is new increased density in the right hemi thorax consistent with a pleural effusion. The hemidiaphragm is obscured. On the left the lung is well-expanded. The interstitial markings are mildly increased. The cardiac silhouette is enlarged and the pulmonary vascularity is engorged. The trachea is midline. There are degenerative changes of both shoulders.  IMPRESSION: The findings are consistent with congestive heart failure with pulmonary interstitial edema. A moderate size right pleural effusion is suspected.   Electronically Signed   By: David  Martinique   On: 04/29/2014 23:33     Scheduled Meds: . atorvastatin  10 mg Oral q1800  . calcium-vitamin D  1 tablet Oral q1800  . cholecalciferol  1,000 Units Oral BID WC  . fluticasone  2 spray Each Nare Daily  . furosemide  40 mg Intravenous Daily  . ipratropium  0.5 mg Nebulization Q6H  . levalbuterol  0.63 mg Nebulization Q6H  . levothyroxine  50 mcg Oral QAC breakfast  . methylPREDNISolone (SOLU-MEDROL) injection  60 mg Intravenous Q12H  . metoprolol  50 mg Oral BID  . montelukast  10 mg Oral QHS  . omega-3 acid ethyl esters  2 g Oral BID  . pantoprazole  80 mg Oral Daily  . rivaroxaban  20 mg Oral Q supper  . sertraline  100 mg Oral QHS  . sodium chloride  3 mL Intravenous Q12H   Continuous Infusions:   Principal Problem:   CHF (congestive heart failure) Active Problems:   HYPOTHYROIDISM   HYPERLIPIDEMIA   ATRIAL FIBRILLATION, PAROXYSMAL   REACTIVE AIRWAY DISEASE   Nonunion of subtalar arthrodesis    Time spent:35 minutes.     [facility] Pager [phone]. If 7PM-7AM, please contact night-coverage at [email or web address], password [facility] 04/30/2014, 9:03 AM  LOS: 1 day

## 2014-04-30 NOTE — H&P (Signed)
PULMONARY / CRITICAL CARE MEDICINE   Name: [name] MRN: [id number] DOB: Oct 10, 1936    ADMISSION DATE:  04/29/2014 CONSULTATION DATE:  5/16   REFERRING MD :  [facility]  PRIMARY SERVICE:  PCCM   CHIEF COMPLAINT:  RLL PNA/Effusion [name]   BRIEF PATIENT DESCRIPTION:  78 yoWF admitted to [facility] 5/15 for acute resp failure for RLL PNA , pleural effusion and atrial fib with rVR .Transfered to [facility] ICU 5/16 d/t worsening resp status , PCCM to admit .  [facility] office pt w/ RAD (nml PFT)   SIGNIFICANT EVENTS / STUDIES:  5/15 CT chest >neg for PE , RLL consolidation/mod effusion  5/16 transfer from [facility] to [facility] ICU    LINES / TUBES:   CULTURES: 5/15 BC x 2 >>   ANTIBIOTICS: 5/15 Vanc  5/15 Primaxin   HISTORY OF PRESENT ILLNESS:     [name] is an 78 y.o. female former smoker  Followed by Dr Elsworth Soho in [place] office as recently as dec 2014 or reacitive airways (sinus v gerd). [place] pulm hx includes   She smoked a PPD x 20-25 yrs before quitting in 1990. She develops recurrent chest colds requiring several rounds of Abx to get better.  Data:  - SHe underwent esophageal dilation for hypertensive LES (Dr Olevia Perches) & is on protonix two times a day  .- Evaluation for chest pain in 6/10 (Dr Percival Spanish) has shown non obstructive CAD - attributed to esophageal spasm.Sucralfate added by GI to zegerid .   - Took macrodantin x 2 yrs for UTIs , stopped '10   - Prednisone makes her hyper and nervous. Has osteoporosis with fractures.   - PFTs nml '11 .  Spirometry normal July 2014 - RAST - IgE 225 -high but no sensitivity to common indoor/ outdoor allergens  - Underwent endoscopic sinus surgery 1/12 by dr Constance Holster : Treated x 6 wks with clinda for chronic sinusitis, started wheezing again when ABx stopped    She also has h hx of afib on Xarelto and betablocker, known CAD, fibromyalgia, HTN, recent right ankle surgery on right ankle, asthma,    She presented to the ER at [facility] 04/29/2014  with persistent  shortness of breath not responding to neb tx. She denied substernal Chest pain, but had some pain in the right posterior ribs. She has no fever, chills, nausea or vomiting.  Evaluation in the ER included a CXR which showed vasuclar congestion and interstitial edema, BNP of 1774, negative troponin, and EKG showed afib with RVR of 140. She was given NTP, IV Lasix, and 10mg  of Cardiazem IV. HR improved w/ controlled rate.  CT chest neg for PE, , mod right pleural effusion ? Loculated, RLL consolidation  Pt was admitted to [facility] , no sign improvement in resp status 5/16 with transfer to [facility] ICU for PCCM to admit .  She was started on Vanc and Primaxin .   Just prior to transfer she got more hyopxemic and also had pain in right chest with resultant spinting. Requiring NRB to keep sats >90%    PAST MEDICAL HISTORY :  Past Medical History  Diagnosis Date  . GERD (gastroesophageal reflux disease)   . Anxiety disorder   . Arthritis   . CAD (coronary artery disease)     Cath May 2010.  Nonbstructive  . Depression   . Hypothyroid   . Asthmatic bronchitis   . Hypertension     dr Percival Spanish  . OA (osteoarthritis)   .  Chronic bronchitis   . 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 Encephalitis     d/t meningitis  . PONV (postoperative nausea and vomiting)     history of cardiac arrest day 1 post surgery  in 2008  . Esophageal motility disorder   . Atrial fibrillation   . Hyperlipidemia   . Fibromyalgia   . Vitamin D deficiency   . Status post dilation of esophageal narrowing   . Bowel obstruction     blockage   Past Surgical History  Procedure Laterality Date  . Total knee arthroplasty Right   . Back surgery    . Coronary angioplasty with stent placement  2003  . Sinus surgery with instatrak    . Knee arthroscopy  03/26/2012    Procedure: ARTHROSCOPY KNEE;  Surgeon: Wylene Simmer, MD;  Location: [place];  Service: Orthopedics;  Laterality: Left;  with Debridement of  Lateral Meniscus tear  . Rotator cuff repair Bilateral   . Ankle fusion  08/27/2012    Procedure: ARTHRODESIS ANKLE;  Surgeon: Wylene Simmer, MD;  Location: [place];  Service: Orthopedics;  Laterality: Right;  Arthrodesis right ankle and subtalar joint  . Foot arthrodesis, subtalar Right 2013  . Removal of implant Right 03/31/2014    DR HEWITT  . Hardware revision  03/31/2014    SUBTALOR  ARTHRODESIS       DR HEWITT  . Hardware removal Right 03/31/2014    Procedure: REMOVAL OF DEEP IMPLANTS X 3  RIGHT ;  Surgeon: Wylene Simmer, MD;  Location: [place];  Service: Orthopedics;  Laterality: Right;  . Arthrodesis tibiofibular Right 03/31/2014    Procedure: REVISION OF SUBTALOR ARTHRODESIS  RIGHT ;  Surgeon: Wylene Simmer, MD;  Location: [place];  Service: Orthopedics;  Laterality: Right;   Prior to Admission medications   Medication Sig Start Date End Date Taking? Authorizing Provider  acetaminophen (TYLENOL) 325 MG tablet Take 650 mg by mouth every 6 (six) hours as needed. For pain/fever   Yes Historical Provider, MD  albuterol (PROVENTIL HFA;VENTOLIN HFA) 108 (90 BASE) MCG/ACT inhaler Inhale 2 puffs into the lungs every 6 (six) hours as needed for wheezing. 09/06/13  Yes Chipper Herb, MD  ALPRAZolam Duanne Moron) 0.5 MG tablet Take 1 mg by mouth at bedtime as needed for anxiety.   Yes Historical Provider, MD  Ascorbic Acid (VITAMIN C PO) Take 1 tablet by mouth every morning.   Yes Historical Provider, MD  Calcium Carbonate-Vitamin D (CALCIUM 600+D) 600-400 MG-UNIT per tablet Take 1 tablet by mouth daily at 6 PM.    Yes Historical Provider, MD  cholecalciferol (VITAMIN D) 1000 UNITS tablet Take 1,000 Units by mouth 2 (two) times daily with breakfast and lunch.    Yes Historical Provider, MD  cyclobenzaprine (FLEXERIL) 10 MG tablet Take 10 mg by mouth 3 (three) times daily as needed for muscle spasms.   Yes Historical Provider, MD  dextromethorphan-guaiFENesin (MUCINEX DM) 30-600 MG per 12 hr tablet Take 1 tablet by  mouth every 12 (twelve) hours. scheduled   Yes Historical Provider, MD  dicyclomine (BENTYL) 10 MG capsule Take 10-20 mg by mouth every 6 (six) hours as needed for spasms. 10-20mg  tablets depending on how bad the spasms are.   Yes Historical Provider, MD  fluticasone (FLONASE) 50 MCG/ACT nasal spray Place 2 sprays into both nostrils daily.   Yes Historical Provider, MD  isosorbide mononitrate (IMDUR) 60 MG 24 hr tablet Take 60 mg by mouth  at bedtime.   Yes Historical Provider, MD  levofloxacin (LEVAQUIN) 500 MG tablet Take 500 mg by mouth daily. 7 day course starting on 04/28/2014   Yes Historical Provider, MD  levothyroxine (SYNTHROID, LEVOTHROID) 25 MCG tablet Take 50 mcg by mouth daily before breakfast.   Yes Historical Provider, MD  loratadine (CLARITIN) 10 MG tablet Take 10 mg by mouth 2 (two) times daily.   Yes Historical Provider, MD  methylPREDNISolone sodium succinate (SOLU-MEDROL) 40 mg/mL injection Inject 80 mg into the vein once.   Yes Historical Provider, MD  metoprolol (LOPRESSOR) 50 MG tablet Take 50 mg by mouth 2 (two) times daily.   Yes Historical Provider, MD  montelukast (SINGULAIR) 10 MG tablet Take 10 mg by mouth at bedtime.   Yes Historical Provider, MD  Multiple Vitamin (MULTIVITAMIN WITH MINERALS) TABS Take 1 tablet by mouth daily.   Yes Historical Provider, MD  omega-3 acid ethyl esters (LOVAZA) 1 G capsule Take 2 g by mouth 2 (two) times daily.   Yes Historical Provider, MD  omeprazole-sodium bicarbonate (ZEGERID) 40-1100 MG per capsule Take 1 capsule by mouth daily before breakfast.   Yes Historical Provider, MD  ondansetron (ZOFRAN) 4 MG tablet Take 4 mg by mouth every 8 (eight) hours as needed for nausea or vomiting.   Yes Historical Provider, MD  oxyCODONE (OXY IR/ROXICODONE) 5 MG immediate release tablet Take one tablet by mouth every 4 hours as needed for moderate to severe pain 04/07/14  Yes Pricilla Larsson, NP  predniSONE (DELTASONE) 20 MG tablet Take 60 mg by mouth  daily. 3 day course starting on 04/28/2014   Yes Historical Provider, MD  rivaroxaban (XARELTO) 20 MG TABS tablet Take 1 tablet (20 mg total) by mouth daily with supper. 04/05/14  Yes Wylene Simmer, MD  rosuvastatin (CRESTOR) 20 MG tablet Take 20 mg by mouth at bedtime.   Yes Historical Provider, MD  sertraline (ZOLOFT) 100 MG tablet Take 100 mg by mouth at bedtime.   Yes Historical Provider, MD  traMADol (ULTRAM) 50 MG tablet Take 50 mg by mouth every 6 (six) hours as needed for moderate pain.  03/04/14  Yes Historical Provider, MD  VITAMIN E PO Take 1 capsule by mouth every morning.   Yes Historical Provider, MD  albuterol (PROVENTIL) (2.5 MG/3ML) 0.083% nebulizer solution Take 2.5 mg by nebulization every 6 (six) hours as needed for wheezing or shortness of breath.    Historical Provider, MD  denosumab (PROLIA) 60 MG/ML SOLN injection Inject 60 mg into the skin every 6 (six) months. Administer in upper arm, thigh, or abdomen    Historical Provider, MD  nitroGLYCERIN (NITROSTAT) 0.4 MG SL tablet Place 0.4 mg under the tongue every 5 (five) minutes as needed. For chest pain    Historical Provider, MD   Allergies  Allergen Reactions  . Aspirin Other (See Comments)    REACTION: regular strength causes "heart to beat fast"  . Captopril Hypertension  . Naproxen Other (See Comments)    Tongue swelling  . Penicillins     Swelling around site  . Sulfonamide Derivatives Hives  . Clindamycin/Lincomycin     FAMILY HISTORY:  Family History  Problem Relation Age of Onset  . Prostate cancer Brother   . Heart disease Mother   . Emphysema Sister   . Asthma Mother    SOCIAL HISTORY:  reports that she quit smoking about 24 years ago. Her smoking use included Cigarettes. She has a 15 pack-year smoking history. She has never  used smokeless tobacco. She reports that she does not drink alcohol or use illicit drugs.  REVIEW OF SYSTEMS:   Constitutional:   No  weight loss, night sweats,  Fevers, chills,   +atigue, or  lassitude.  HEENT:   No headaches,  Difficulty swallowing,  Tooth/dental problems, or  Sore throat,                No sneezing, itching, ear ache, nasal congestion, post nasal drip,   CV:  No chest pain,  Orthopnea, PND, swelling in lower extremities, anasarca, dizziness, palptations,   GI  No heartburn, indigestion, abdominal pain, nausea, vomiting, diarrhea, change in bowel habits, loss of appetite, bloody stools.   Resp:  No chest wall deformity  Skin: no rash or lesions.  GU: no dysuria, change in color of urine, no urgency or frequency.  No flank pain, no hematuria   MS:  No joint pain or swelling.  No decreased range of motion.  No back pain.  Psych:   +depression or anxiety.   SUBJECTIVE:    VITAL SIGNS: Temp:  [98.2 F (36.8 C)-99.1 F (37.3 C)] 98.2 F (36.8 C) (05/16 1200) Pulse Rate:  [40-137] 71 (05/16 1200) Resp:  [15-26] 20 (05/16 1200) BP: (107-171)/(51-102) 107/51 mmHg (05/16 1200) SpO2:  [85 %-96 %] 91 % (05/16 1257) Weight:  [153 lb (69.4 kg)] 153 lb (69.4 kg) (05/16 0901) HEMODYNAMICS:   VENTILATOR SETTINGS:   INTAKE / OUTPUT: Intake/Output     05/15 0701 - 05/16 0700 05/16 0701 - 05/17 0700   Other  1450   Total Intake(mL/kg)  1450 (20.9)   Urine (mL/kg/hr) 300    Total Output 300     Net -300 +1450          PHYSICAL EXAMINATION: General:  Anxious  Neuro:  A/o x 3 follows commands  HEENT:  Intact  Cardiovascular:  RRR , no m/r/g  Lungs: diminished bs in bases  Abdomen:  Soft nt , bs + Musculoskeletal:  Intact . RLE distal incast Skin:  Intact   LABS:  CBC  Recent Labs Lab 04/29/14 2248 04/30/14 0525  WBC 25.2* 24.6*  HGB 12.9 13.0  HCT 35.4* 36.9  PLT 220 257   Coag's No results found for this basename: APTT, INR,  in the last 168 hours BMET  Recent Labs Lab 04/29/14 2248 04/30/14 0525  NA 126* 128*  K 4.1 3.7  CL 86* 87*  CO2 24 24  BUN 17 14  CREATININE 0.59 0.56  GLUCOSE 133* 201*    Electrolytes  Recent Labs Lab 04/29/14 2248 04/30/14 0525  CALCIUM 8.7 8.3*   Sepsis Markers No results found for this basename: LATICACIDVEN, PROCALCITON, O2SATVEN,  in the last 168 hours ABG No results found for this basename: PHART, PCO2ART, PO2ART,  in the last 168 hours Liver Enzymes No results found for this basename: AST, ALT, ALKPHOS, BILITOT, ALBUMIN,  in the last 168 hours Cardiac Enzymes  Recent Labs Lab 04/29/14 2248  PROBNP 1774.0*   Glucose No results found for this basename: GLUCAP,  in the last 168 hours  Imaging Ct Angio Chest Pe W/cm &/or Wo Cm  04/30/2014   CLINICAL DATA:  Recent ankle fracture, back pain, shortness of breath.  EXAM: CT ANGIOGRAPHY CHEST WITH CONTRAST  TECHNIQUE: Multidetector CT imaging of the chest was performed using the standard protocol during bolus administration of intravenous contrast. Multiplanar CT image reconstructions and MIPs were obtained to evaluate the vascular anatomy.  CONTRAST:  133mL OMNIPAQUE IOHEXOL 350 MG/ML SOLN  COMPARISON:  DG CHEST 1V PORT dated 04/29/2014  FINDINGS: Suboptimal contrast opacification of the main pulmonary artery (220 Hounsfield units), limiting assessment for subsegmental pulmonary embolism, no pulmonary arterial filling defects seen to the segmental branches. Main pulmonary artery is 2.8 cm in transaxial dimension.  The heart appears moderately enlarged, Coronary artery calcifications present. Pericardium is nonsuspicious. Thoracic aorta is normal in course and caliber with moderate calcific atherosclerosis. No lymphadenopathy by CT size criteria.  Moderate right pleural effusion, lobulated in appearance could reflect loculation. Hypoenhancing right lower lobe consolidation. Trace left pleural effusion. Pulmonary vasculature engorgement. No pneumothorax. Mild effacement of the right lower lobe bronchi.  Included view of the abdomen is unremarkable. Severe degenerative change of the right shoulder with tiny  calcifications which could reflect loose bodies. Moderate degenerative change of the thoracic spine, motion through the lower thoracic level results in spurious elongation of a single thoracic vertebra.  Review of the MIP images confirms the above findings.  IMPRESSION: Suboptimal contrast bolus timing with no convincing evidence of pulmonary arterial embolism to at least the segmental branches.  Moderate right pleural effusion, possibly loculated, with hypoenhancing underlying lung suggesting atelectasis and superimposed pneumonia.  Cardiomegaly and pulmonary vasculature congestion.   Electronically Signed   By: Elon Alas   On: 04/30/2014 01:55   Dg Chest Port 1 View  04/29/2014   CLINICAL DATA:  Shortness of breath and cough with history of bronchitis  EXAM: PORTABLE CHEST - 1 VIEW  COMPARISON:  DG CHEST 1V PORT dated 04/01/2014  FINDINGS: There is new increased density in the right hemi thorax consistent with a pleural effusion. The hemidiaphragm is obscured. On the left the lung is well-expanded. The interstitial markings are mildly increased. The cardiac silhouette is enlarged and the pulmonary vascularity is engorged. The trachea is midline. There are degenerative changes of both shoulders.  IMPRESSION: The findings are consistent with congestive heart failure with pulmonary interstitial edema. A moderate size right pleural effusion is suspected.   Electronically Signed   By: David  Martinique   On: 04/29/2014 23:33     CXR: 5/15 congestive heart failure with  pulmonary interstitial edema. A moderate size right pleural effusion  is suspected.   ASSESSMENT / PLAN:  PULMONARY A: 1-Acute Respiratory failure, Hypoxic; Multifactorial secondary to PNA, Asthma exacerbation, Heart Failure. /pleural effusion -loculated   ?empyema   - has laryngeal voice? Some VCD going on as well  P:   O2 to keep sat >90%  May need intubation if cont to decompensate with increased WOB  Cont Nebs ipratropium,  Xopenex.  Continue with IV solumedrol. -decrease dose as contributes to her anixiety  Continue with IV lasix. As b/p /scr allow  Continue Vancomycin and zosyn.  Check cxr in am  Consult Thoracic surgery ; might need VATS     CARDIOVASCULAR A: Atrial Fib w/ RVR >now in NSR  CHF   P:  Tr bnp  Cont diuresis as b/p and scr allow  Check tsh  Check echo  Hold Xarelto for now , consider Hep Drip if needed for procedure esp VATS; timing per Pharmacy for heparin gtt Change metoprolol to prn  D/c lovaza for now    RENAL A:  Hyponatremia  P:   Tr bmet ; check ur osm, creat, na and concomitant serum osm, creat and Na Replace electrolytes as indicated   GASTROINTESTINAL A:  GERD  P:   NPO  PPI   HEMATOLOGIC A:  P:  Follow h/h   INFECTIOUS A:  RLL PNA  P:   Continue IV abx  Follow cx date  Tr wbc and temp   ENDOCRINE A:  Hyperglycemia   P:   SSI   NEUROLOGIC A:  Anxiety /Agitation / Baseline fibromylagia - on xanax, oxycodone, zoloft, ultram at baseline Currently acute right chest pain due to  P:   Avoid oversedation , d/c morphine  But use fentanyl for pain prn Low dose xanax if needed for anxity Hold oxycodone, zoloft and ultram for now Monitor closley    MSK A: RLE in cast from recent surgery P Duplex LE for DVT  TODAY'S SUMMARY: 78 yo former smoker with RLL PNA , pleural effusion with worsening resp distress .   Melvenia Needles NP-C  Pulmonary and [place] Pager: [phone]  04/30/2014, 3:25 PM     I have personally obtained a history, examined the patient, evaluated laboratory and imaging results, formulated the assessment and plan and placed orders. CRITICAL CARE: The patient is critically ill with multiple organ systems failure and requires high complexity decision making for assessment and support, frequent evaluation and titration of therapies, application of advanced monitoring technologies and  extensive interpretation of multiple databases. Critical Care Time devoted to patient care services described in this note is  40 minutes D/w son and patient: might need VATS. Address hyponatermia. Control pain and anxiety   Dr. Brand Males, M.D., [facility].C.P Pulmonary and Critical Care Medicine Staff Physician [place] [facility] Pager: [phone], If no answer or between  15:00h - 7:00h: call [phone]  04/30/2014 5:21 PM

## 2014-05-01 ENCOUNTER — Inpatient Hospital Stay (HOSPITAL_COMMUNITY): Payer: PRIVATE HEALTH INSURANCE

## 2014-05-01 DIAGNOSIS — R0602 Shortness of breath: Secondary | ICD-10-CM

## 2014-05-01 DIAGNOSIS — J942 Hemothorax: Secondary | ICD-10-CM | POA: Diagnosis present

## 2014-05-01 DIAGNOSIS — I369 Nonrheumatic tricuspid valve disorder, unspecified: Secondary | ICD-10-CM

## 2014-05-01 DIAGNOSIS — I4891 Unspecified atrial fibrillation: Secondary | ICD-10-CM

## 2014-05-01 DIAGNOSIS — I509 Heart failure, unspecified: Secondary | ICD-10-CM

## 2014-05-01 LAB — CBC
HCT: 38 % (ref 36.0–46.0)
Hemoglobin: 13.2 g/dL (ref 12.0–15.0)
MCH: 32.5 pg (ref 26.0–34.0)
MCHC: 34.7 g/dL (ref 30.0–36.0)
MCV: 93.6 fL (ref 78.0–100.0)
Platelets: 275 10*3/uL (ref 150–400)
RBC: 4.06 MIL/uL (ref 3.87–5.11)
RDW: 14.4 % (ref 11.5–15.5)
WBC: 28.9 10*3/uL — ABNORMAL HIGH (ref 4.0–10.5)

## 2014-05-01 LAB — BASIC METABOLIC PANEL
BUN: 25 mg/dL — ABNORMAL HIGH (ref 6–23)
CO2: 25 mEq/L (ref 19–32)
Calcium: 8.8 mg/dL (ref 8.4–10.5)
Chloride: 87 mEq/L — ABNORMAL LOW (ref 96–112)
Creatinine, Ser: 0.81 mg/dL (ref 0.50–1.10)
GFR calc Af Amer: 80 mL/min — ABNORMAL LOW (ref >90)
GFR calc non Af Amer: 69 mL/min — ABNORMAL LOW (ref >90)
Glucose, Bld: 163 mg/dL — ABNORMAL HIGH (ref 70–99)
Potassium: 4.7 mEq/L (ref 3.7–5.3)
Sodium: 130 mEq/L — ABNORMAL LOW (ref 137–147)

## 2014-05-01 LAB — HEPATIC FUNCTION PANEL
ALT: 45 U/L — ABNORMAL HIGH (ref 0–35)
AST: 63 U/L — ABNORMAL HIGH (ref 0–37)
Albumin: 3 g/dL — ABNORMAL LOW (ref 3.5–5.2)
Alkaline Phosphatase: 183 U/L — ABNORMAL HIGH (ref 39–117)
Bilirubin, Direct: <0.2 mg/dL (ref 0.0–0.3)
Total Bilirubin: 0.6 mg/dL (ref 0.3–1.2)
Total Protein: 8.6 g/dL — ABNORMAL HIGH (ref 6.0–8.3)

## 2014-05-01 LAB — LEGIONELLA ANTIGEN, URINE: Legionella Antigen, Urine: NEGATIVE

## 2014-05-01 LAB — APTT: aPTT: 43 seconds — ABNORMAL HIGH (ref 24–37)

## 2014-05-01 LAB — PROCALCITONIN: Procalcitonin: 2.1 ng/mL

## 2014-05-01 LAB — HEPARIN LEVEL (UNFRACTIONATED): Heparin Unfractionated: 1.1 IU/mL — ABNORMAL HIGH (ref 0.30–0.70)

## 2014-05-01 MED ORDER — METOPROLOL TARTRATE 25 MG PO TABS
25.0000 mg | ORAL_TABLET | Freq: Two times a day (BID) | ORAL | Status: DC
  Filled 2014-05-01: qty 1

## 2014-05-01 MED ORDER — METOPROLOL TARTRATE 25 MG PO TABS
25.0000 mg | ORAL_TABLET | Freq: Two times a day (BID) | ORAL | Status: DC
  Administered 2014-05-01 – 2014-05-07 (×9): 25 mg via ORAL
  Filled 2014-05-01 (×15): qty 1

## 2014-05-01 MED ORDER — METOPROLOL TARTRATE 25 MG PO TABS: 25.0000 mg | ORAL_TABLET | Freq: Two times a day (BID) | ORAL | Status: DC

## 2014-05-01 NOTE — Progress Notes (Signed)
Echo Lab  2D Echocardiogram completed.  [name], [name] 05/01/2014 12:28 PM

## 2014-05-01 NOTE — Progress Notes (Signed)
VASCULAR LAB PRELIMINARY  PRELIMINARY  PRELIMINARY  PRELIMINARY  Bilateral lower extremity venous Dopplers completed.    Preliminary report:  Study was technically limited by cast on right lower leg.  No obvious evidence of DVT or SVT noted in the visualized veins of the bilateral lower extremities.   Iantha Fallen, RVT 05/01/2014, 12:34 PM

## 2014-05-01 NOTE — Progress Notes (Addendum)
TCTS DAILY ICU PROGRESS NOTE                   [address]            [place] [address]          [phone]        Total Length of Stay:  LOS: 2 days   Subjective: Feeling SOB, but not worse then on admission, cough persistes  Objective: Vital signs in last 24 hours: Temp:  [98.2 F (36.8 C)-98.4 F (36.9 C)] 98.4 F (36.9 C) (05/17 0800) Pulse Rate:  [40-137] 101 (05/17 0900) Cardiac Rhythm:  [-] Atrial fibrillation (05/17 0800) Resp:  [10-27] 11 (05/17 0900) BP: (84-158)/(50-101) 125/61 mmHg (05/17 0900) SpO2:  [89 %-95 %] 93 % (05/17 0900) FiO2 (%):  [55 %] 55 % (05/16 1450) Weight:  [151 lb 14.4 oz (68.9 kg)-152 lb 12.5 oz (69.3 kg)] 151 lb 14.4 oz (68.9 kg) (05/17 0223)  Filed Weights   04/30/14 0901 04/30/14 1450 05/01/14 0223  Weight: 153 lb (69.4 kg) 152 lb 12.5 oz (69.3 kg) 151 lb 14.4 oz (68.9 kg)    Weight change: -0 oz (-0 kg)   Hemodynamic parameters for last 24 hours:    Intake/Output from previous day: 05/16 0701 - 05/17 0700 In: 3197.1 [P.O.:480; I.V.:267.1; IV Piggyback:600] Out: 425 [Urine:425]  Intake/Output this shift: Total I/O In: 23 [I.V.:23] Out: -   Current Meds: Scheduled Meds: . atorvastatin  10 mg Oral q1800  . Chlorhexidine Gluconate Cloth  6 each Topical Q0600  . fluticasone  2 spray Each Nare Daily  . furosemide  40 mg Intravenous Daily  . imipenem-cilastatin  250 mg Intravenous 4 times per day  . ipratropium  0.5 mg Nebulization Q6H  . levalbuterol  0.63 mg Nebulization Q6H  . levothyroxine  50 mcg Oral QAC breakfast  . montelukast  10 mg Oral QHS  . mupirocin ointment  1 application Nasal BID  . pantoprazole  40 mg Oral Daily  . sertraline  100 mg Oral QHS  . sodium chloride  3 mL Intravenous Q12H  . vancomycin  750 mg Intravenous Q12H   Continuous Infusions: . sodium chloride 10 mL/hr at 04/30/14 2000   PRN Meds:.ALPRAZolam, ALPRAZolam, HYDROmorphone (DILAUDID) injection, levalbuterol,  metoprolol  General appearance: alert, cooperative and no distress Heart: irregularly irregular rhythm and tachy Lungs: coarse upper airway ronchi, dim in bases Abdomen: benign Extremities: no edema Wound: N/A  Lab Results: CBC: Recent Labs  04/30/14 0525 05/01/14 0244  WBC 24.6* 28.9*  HGB 13.0 13.2  HCT 36.9 38.0  PLT 257 275   BMET:  Recent Labs  04/30/14 0525 05/01/14 0244  NA 128* 130*  K 3.7 4.7  CL 87* 87*  CO2 24 25  GLUCOSE 201* 163*  BUN 14 25*  CREATININE 0.56 0.81  CALCIUM 8.3* 8.8    PT/INR: No results found for this basename: LABPROT, INR,  in the last 72 hours Radiology: Ct Angio Chest Pe W/cm &/or Wo Cm  04/30/2014   CLINICAL DATA:  Recent ankle fracture, back pain, shortness of breath.  EXAM: CT ANGIOGRAPHY CHEST WITH CONTRAST  TECHNIQUE: Multidetector CT imaging of the chest was performed using the standard protocol during bolus administration of intravenous contrast. Multiplanar CT image reconstructions and MIPs were obtained to evaluate the vascular anatomy.  CONTRAST:  152mL OMNIPAQUE IOHEXOL 350 MG/ML SOLN  COMPARISON:  DG CHEST 1V PORT dated 04/29/2014  FINDINGS: Suboptimal contrast opacification of  the main pulmonary artery (220 Hounsfield units), limiting assessment for subsegmental pulmonary embolism, no pulmonary arterial filling defects seen to the segmental branches. Main pulmonary artery is 2.8 cm in transaxial dimension.  The heart appears moderately enlarged, Coronary artery calcifications present. Pericardium is nonsuspicious. Thoracic aorta is normal in course and caliber with moderate calcific atherosclerosis. No lymphadenopathy by CT size criteria.  Moderate right pleural effusion, lobulated in appearance could reflect loculation. Hypoenhancing right lower lobe consolidation. Trace left pleural effusion. Pulmonary vasculature engorgement. No pneumothorax. Mild effacement of the right lower lobe bronchi.  Included view of the abdomen is  unremarkable. Severe degenerative change of the right shoulder with tiny calcifications which could reflect loose bodies. Moderate degenerative change of the thoracic spine, motion through the lower thoracic level results in spurious elongation of a single thoracic vertebra.  Review of the MIP images confirms the above findings.  IMPRESSION: Suboptimal contrast bolus timing with no convincing evidence of pulmonary arterial embolism to at least the segmental branches.  Moderate right pleural effusion, possibly loculated, with hypoenhancing underlying lung suggesting atelectasis and superimposed pneumonia.  Cardiomegaly and pulmonary vasculature congestion.   Electronically Signed   By: Elon Alas   On: 04/30/2014 01:55   Dg Chest Port 1 View  05/01/2014   CLINICAL DATA:  Respiratory distress.  EXAM: PORTABLE CHEST - 1 VIEW  COMPARISON:  Yesterday.  FINDINGS: Decreased inspiration. Stable enlarged cardiac silhouette. No gross change in prominence of the pulmonary vasculature and interstitial markings. Moderate-sized right pleural effusion, increased. Bilateral shoulder degenerative changes.  IMPRESSION: 1. Moderate-sized right pleural effusion, increased. 2. Stable cardiomegaly and interstitial pulmonary edema.   Electronically Signed   By: Enrique Sack M.D.   On: 05/01/2014 06:27   Dg Chest Port 1 View  04/30/2014   CLINICAL DATA:  Shortness of with cough and congestion  EXAM: PORTABLE CHEST - 1 VIEW  COMPARISON:  CT ANGIO CHEST W/CM &/OR WO/CM dated 04/30/2014; DG CHEST 1V PORT dated 04/29/2014  FINDINGS: The left lung is adequately inflated. On the right there is persistent volume loss that is slightly less conspicuous than on yesterday's study. This is consistent with the known moderate-sized pleural effusion. The pulmonary interstitial markings have improved bilaterally. Cardiopericardial silhouette where visualized appears enlarged. The central pulmonary vascularity remains prominent. There is no  pneumothorax. There are degenerative changes of both shoulders.  IMPRESSION: There has been slight interval improvement in the appearance of the pulmonary interstitium bilaterally. A moderate-sized right-sided pleural effusion remains. There remain findings of mild CHF.   Electronically Signed   By: David  Martinique   On: 04/30/2014 17:43   Dg Chest Port 1 View  04/29/2014   CLINICAL DATA:  Shortness of breath and cough with history of bronchitis  EXAM: PORTABLE CHEST - 1 VIEW  COMPARISON:  DG CHEST 1V PORT dated 04/01/2014  FINDINGS: There is new increased density in the right hemi thorax consistent with a pleural effusion. The hemidiaphragm is obscured. On the left the lung is well-expanded. The interstitial markings are mildly increased. The cardiac silhouette is enlarged and the pulmonary vascularity is engorged. The trachea is midline. There are degenerative changes of both shoulders.  IMPRESSION: The findings are consistent with congestive heart failure with pulmonary interstitial edema. A moderate size right pleural effusion is suspected.   Electronically Signed   By: David  Martinique   On: 04/29/2014 23:33     Assessment/Plan:  1 Dr Servando Snare is recommending IR placement of catheter for drainage of right effusion  and if unsuccessful may require VATS     John Giovanni 05/01/2014 10:28 AM  Case discussed with Dr Joya Gaskins, attempt at thoracentesis  was not successful. Persistent elevated WBC fever staying down,  Will try CT guided placement of small bore chest tube. If unable to drain effusion will consider VATS. Discussed with patient and son. Hold heparin for now, last dose of xarelto 5/15 pm . I have seen and examined [name] and agree with the above assessment  and plan.  [name] MD Beeper [phone] Office [phone] 05/01/2014 1:50 PM

## 2014-05-01 NOTE — Progress Notes (Signed)
Patient ID: [name], female   DOB: 27-Jun-1936, 78 y.o.   MRN: [id number] Request received from CCM  for placement of a right chest drain in pt with elevated WBC, RLL PNA/ complex loculated right pleural effusion and recent thoracentesis attempt by CCM yielding minimal bloody fluid. Additional PMH as below. Exam: pt awake with frequent coughing bouts on O2 via N/C; chest- dim BS bases, coarse upper fields;heart- tachy, irreg irreg; abd- soft,+BS,NT; ext- rt foot cast.    Filed Vitals:   05/01/14 0825 05/01/14 0826 05/01/14 0900 05/01/14 1200  BP:   125/61 128/89  Pulse:  124 101   Temp:    97.6 F (36.4 C)  TempSrc:    Oral  Resp:  19 11   Height:      Weight:      SpO2: 93% 92% 93%    Past Medical History  Diagnosis Date  . GERD (gastroesophageal reflux disease)   . Anxiety disorder   . Arthritis   . CAD (coronary artery disease)     Cath May 2010.  Nonbstructive  . Depression   . Hypothyroid   . Asthmatic bronchitis   . Hypertension     dr Percival Spanish  . OA (osteoarthritis)   . Chronic bronchitis   . 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 Encephalitis     d/t meningitis  . PONV (postoperative nausea and vomiting)     history of cardiac arrest day 1 post surgery  in 2008  . Esophageal motility disorder   . Atrial fibrillation   . Hyperlipidemia   . Fibromyalgia   . Vitamin D deficiency   . Status post dilation of esophageal narrowing   . Bowel obstruction     blockage   Past Surgical History  Procedure Laterality Date  . Total knee arthroplasty Right   . Back surgery    . Coronary angioplasty with stent placement  2003  . Sinus surgery with instatrak    . Knee arthroscopy  03/26/2012    Procedure: ARTHROSCOPY KNEE;  Surgeon: Wylene Simmer, MD;  Location: [facility];  Service: Orthopedics;  Laterality: Left;  with Debridement of Lateral Meniscus tear  . Rotator cuff repair Bilateral   . Ankle fusion  08/27/2012    Procedure: ARTHRODESIS ANKLE;   Surgeon: Wylene Simmer, MD;  Location: [facility];  Service: Orthopedics;  Laterality: Right;  Arthrodesis right ankle and subtalar joint  . Foot arthrodesis, subtalar Right 2013  . Removal of implant Right 03/31/2014    DR HEWITT  . Hardware revision  03/31/2014    SUBTALOR  ARTHRODESIS       DR HEWITT  . Hardware removal Right 03/31/2014    Procedure: REMOVAL OF DEEP IMPLANTS X 3  RIGHT ;  Surgeon: Wylene Simmer, MD;  Location: [facility];  Service: Orthopedics;  Laterality: Right;  . Arthrodesis tibiofibular Right 03/31/2014    Procedure: REVISION OF SUBTALOR ARTHRODESIS  RIGHT ;  Surgeon: Wylene Simmer, MD;  Location: [facility];  Service: Orthopedics;  Laterality: Right;  Ct Angio Chest Pe W/cm &/or Wo Cm  04/30/2014   CLINICAL DATA:  Recent ankle fracture, back pain, shortness of breath.  EXAM: CT ANGIOGRAPHY CHEST WITH CONTRAST  TECHNIQUE: Multidetector CT imaging of the chest was performed using the standard protocol during bolus administration of intravenous contrast. Multiplanar CT image reconstructions and MIPs were obtained to evaluate the vascular anatomy.  CONTRAST:  139mL OMNIPAQUE IOHEXOL 350 MG/ML SOLN  COMPARISON:  DG CHEST 1V PORT dated 04/29/2014  FINDINGS: Suboptimal contrast opacification of the main pulmonary artery (220 Hounsfield units), limiting assessment for subsegmental pulmonary embolism, no pulmonary arterial filling defects seen to the segmental branches. Main pulmonary artery is 2.8 cm in transaxial dimension.  The heart appears moderately enlarged, Coronary artery calcifications present. Pericardium is nonsuspicious. Thoracic aorta is normal in course and caliber with moderate calcific atherosclerosis. No lymphadenopathy by CT size criteria.  Moderate right pleural effusion, lobulated in appearance could reflect loculation. Hypoenhancing right lower lobe consolidation. Trace left pleural effusion. Pulmonary vasculature engorgement. No pneumothorax. Mild effacement of the right lower lobe  bronchi.  Included view of the abdomen is unremarkable. Severe degenerative change of the right shoulder with tiny calcifications which could reflect loose bodies. Moderate degenerative change of the thoracic spine, motion through the lower thoracic level results in spurious elongation of a single thoracic vertebra.  Review of the MIP images confirms the above findings.  IMPRESSION: Suboptimal contrast bolus timing with no convincing evidence of pulmonary arterial embolism to at least the segmental branches.  Moderate right pleural effusion, possibly loculated, with hypoenhancing underlying lung suggesting atelectasis and superimposed pneumonia.  Cardiomegaly and pulmonary vasculature congestion.   Electronically Signed   By: Elon Alas   On: 04/30/2014 01:55   Dg Chest Port 1 View  05/01/2014   CLINICAL DATA:  Right pleural effusion; cough and congestion  EXAM: PORTABLE CHEST - 1 VIEW  COMPARISON:  Study obtained earlier in the day  FINDINGS: There is a persistent right pleural effusion with consolidation in the right base. Left lung is clear. Heart is mildly enlarged with normal pulmonary vascularity. There is atherosclerotic change in the aorta. No adenopathy. No pneumothorax.  IMPRESSION: Persistent right pleural effusion with right base consolidation. Left lung clear. Stable cardiac prominence.   Electronically Signed   By: Lowella Grip M.D.   On: 05/01/2014 11:16   Dg Chest Port 1 View  05/01/2014   CLINICAL DATA:  Respiratory distress.  EXAM: PORTABLE CHEST - 1 VIEW  COMPARISON:  Yesterday.  FINDINGS: Decreased inspiration. Stable enlarged cardiac silhouette. No gross change in prominence of the pulmonary vasculature and interstitial markings. Moderate-sized right pleural effusion, increased. Bilateral shoulder degenerative changes.  IMPRESSION: 1. Moderate-sized right pleural effusion, increased. 2. Stable cardiomegaly and interstitial pulmonary edema.   Electronically Signed   By: Enrique Sack M.D.   On: 05/01/2014 06:27   Dg Chest Port 1 View  04/30/2014   CLINICAL DATA:  Shortness of with cough and congestion  EXAM: PORTABLE CHEST - 1 VIEW  COMPARISON:  CT ANGIO CHEST W/CM &/OR WO/CM dated 04/30/2014; DG CHEST 1V PORT dated 04/29/2014  FINDINGS: The left lung is adequately inflated. On the right there is persistent volume loss that is slightly less conspicuous than on yesterday's study. This is consistent with the known moderate-sized pleural effusion. The pulmonary interstitial markings have improved bilaterally. Cardiopericardial silhouette where visualized appears enlarged. The central pulmonary vascularity remains prominent. There is no pneumothorax. There are degenerative changes of both shoulders.  IMPRESSION: There has been slight interval improvement in the appearance of the pulmonary interstitium bilaterally. A moderate-sized right-sided pleural effusion remains. There remain findings of mild CHF.   Electronically Signed   By: David  Martinique   On: 04/30/2014 17:43   Dg Chest Port 1 View  04/29/2014   CLINICAL DATA:  Shortness of breath and cough with history of bronchitis  EXAM: PORTABLE CHEST - 1 VIEW  COMPARISON:  DG CHEST 1V PORT dated 04/01/2014  FINDINGS: There is new increased density in the right hemi thorax consistent with a pleural effusion. The hemidiaphragm is obscured. On the left the lung is well-expanded. The interstitial markings are mildly increased. The cardiac silhouette is enlarged and the pulmonary vascularity is engorged. The trachea is midline. There are degenerative changes of both shoulders.  IMPRESSION: The findings are consistent with congestive heart failure with pulmonary interstitial edema. A moderate size right pleural effusion is suspected.   Electronically Signed   By: David  Martinique   On: 04/29/2014 23:33   Dg Chest Port 1 View  04/01/2014   CLINICAL DATA:  New onset of cough.  EXAM: PORTABLE CHEST - 1 VIEW  COMPARISON:  Chest x-ray 12/30/2013.   FINDINGS: Lung volumes are normal. No consolidative airspace disease. No pleural effusions. No evidence of pulmonary edema. Mild cardiomegaly. Upper mediastinal contours are slightly distorted by patient's rotation to the right. Atherosclerotic calcifications within the thoracic aorta.  IMPRESSION: 1. No radiographic evidence of acute cardiopulmonary disease. 2. Mild cardiomegaly. 3. Atherosclerosis.   Electronically Signed   By: Vinnie Langton M.D.   On: 04/01/2014 18:40  Results for orders placed during the hospital encounter of 04/29/14  MRSA PCR SCREENING      Result Value Ref Range   MRSA by PCR POSITIVE (*) NEGATIVE  CULTURE, BLOOD (ROUTINE X 2)      Result Value Ref Range   Specimen Description BLOOD RIGHT ARM     Special Requests BOTTLES DRAWN AEROBIC ONLY 8CC BOTTLE     Culture NO GROWTH 1 DAY     Report Status PENDING    CULTURE, BLOOD (ROUTINE X 2)      Result Value Ref Range   Specimen Description BLOOD LEFT ARM     Special Requests       Value: BOTTLES DRAWN AEROBIC AND ANAEROBIC 6CC EACH BOTTLE   Culture NO GROWTH 1 DAY     Report Status PENDING    CBC WITH DIFFERENTIAL      Result Value Ref Range   WBC 25.2 (*) 4.0 - 10.5 K/uL   RBC 3.79 (*) 3.87 - 5.11 MIL/uL   Hemoglobin 12.9  12.0 - 15.0 g/dL   HCT 35.4 (*) 36.0 - 46.0 %   MCV 93.4  78.0 - 100.0 fL   MCH 34.0  26.0 - 34.0 pg   MCHC 36.4 (*) 30.0 - 36.0 g/dL   RDW 13.8  11.5 - 15.5 %   Platelets 220  150 - 400 K/uL   Neutrophils Relative % 89 (*) 43 - 77 %   Neutro Abs 22.5 (*) 1.7 - 7.7 K/uL   Lymphocytes Relative 4 (*) 12 - 46 %   Lymphs Abs 1.1  0.7 - 4.0 K/uL   Monocytes Relative 6  3 - 12 %   Monocytes Absolute 1.6 (*) 0.1 - 1.0 K/uL   Eosinophils Relative 0  0 - 5 %   Eosinophils Absolute 0.0  0.0 - 0.7 K/uL   Basophils Relative 0  0 - 1 %   Basophils Absolute 0.0  0.0 - 0.1 K/uL  BASIC METABOLIC PANEL      Result Value Ref Range   Sodium 126 (*) 137 - 147 mEq/L   Potassium 4.1  3.7 - 5.3 mEq/L    Chloride 86 (*) 96 - 112 mEq/L   CO2 24  19 - 32 mEq/L   Glucose, Bld 133 (*) 70 - 99 mg/dL  BUN 17  6 - 23 mg/dL   Creatinine, Ser 0.59  0.50 - 1.10 mg/dL   Calcium 8.7  8.4 - 10.5 mg/dL   GFR calc non Af Amer 87 (*) >90 mL/min   GFR calc Af Amer >90  >90 mL/min  PRO B NATRIURETIC PEPTIDE      Result Value Ref Range   Pro B Natriuretic peptide (BNP) 1774.0 (*) 0 - 450 pg/mL  BASIC METABOLIC PANEL      Result Value Ref Range   Sodium 128 (*) 137 - 147 mEq/L   Potassium 3.7  3.7 - 5.3 mEq/L   Chloride 87 (*) 96 - 112 mEq/L   CO2 24  19 - 32 mEq/L   Glucose, Bld 201 (*) 70 - 99 mg/dL   BUN 14  6 - 23 mg/dL   Creatinine, Ser 0.56  0.50 - 1.10 mg/dL   Calcium 8.3 (*) 8.4 - 10.5 mg/dL   GFR calc non Af Amer 88 (*) >90 mL/min   GFR calc Af Amer >90  >90 mL/min  CBC      Result Value Ref Range   WBC 24.6 (*) 4.0 - 10.5 K/uL   RBC 4.05  3.87 - 5.11 MIL/uL   Hemoglobin 13.0  12.0 - 15.0 g/dL   HCT 36.9  36.0 - 46.0 %   MCV 91.1  78.0 - 100.0 fL   MCH 32.1  26.0 - 34.0 pg   MCHC 35.2  30.0 - 36.0 g/dL   RDW 13.8  11.5 - 15.5 %   Platelets 257  150 - 400 K/uL  LACTIC ACID, PLASMA      Result Value Ref Range   Lactic Acid, Venous 1.3  0.5 - 2.2 mmol/L  PRO B NATRIURETIC PEPTIDE      Result Value Ref Range   Pro B Natriuretic peptide (BNP) 2780.0 (*) 0 - 450 pg/mL  STREP PNEUMONIAE URINARY ANTIGEN      Result Value Ref Range   Strep Pneumo Urinary Antigen NEGATIVE  NEGATIVE  TSH      Result Value Ref Range   TSH 0.521  0.350 - 4.500 uIU/mL  CBC      Result Value Ref Range   WBC 28.9 (*) 4.0 - 10.5 K/uL   RBC 4.06  3.87 - 5.11 MIL/uL   Hemoglobin 13.2  12.0 - 15.0 g/dL   HCT 38.0  36.0 - 46.0 %   MCV 93.6  78.0 - 100.0 fL   MCH 32.5  26.0 - 34.0 pg   MCHC 34.7  30.0 - 36.0 g/dL   RDW 14.4  11.5 - 15.5 %   Platelets 275  150 - 400 K/uL  BASIC METABOLIC PANEL      Result Value Ref Range   Sodium 130 (*) 137 - 147 mEq/L   Potassium 4.7  3.7 - 5.3 mEq/L   Chloride 87 (*)  96 - 112 mEq/L   CO2 25  19 - 32 mEq/L   Glucose, Bld 163 (*) 70 - 99 mg/dL   BUN 25 (*) 6 - 23 mg/dL   Creatinine, Ser 0.81  0.50 - 1.10 mg/dL   Calcium 8.8  8.4 - 10.5 mg/dL   GFR calc non Af Amer 69 (*) >90 mL/min   GFR calc Af Amer 80 (*) >90 mL/min  PROCALCITONIN      Result Value Ref Range   Procalcitonin 2.33    LEGIONELLA ANTIGEN, URINE      Result Value Ref Range  Specimen Description URINE, RANDOM     Special Requests NONE     Legionella Antigen, Urine       Value: Negative for Legionella pneumophilia serogroup 1     Performed at [facility]   Report Status 05/01/2014 FINAL    HEPATIC FUNCTION PANEL      Result Value Ref Range   Total Protein 8.6 (*) 6.0 - 8.3 g/dL   Albumin 3.0 (*) 3.5 - 5.2 g/dL   AST 63 (*) 0 - 37 U/L   ALT 45 (*) 0 - 35 U/L   Alkaline Phosphatase 183 (*) 39 - 117 U/L   Total Bilirubin 0.6  0.3 - 1.2 mg/dL   Bilirubin, Direct <0.2  0.0 - 0.3 mg/dL   Indirect Bilirubin NOT CALCULATED  0.3 - 0.9 mg/dL  SODIUM, URINE, RANDOM      Result Value Ref Range   Sodium, Ur <20    OSMOLALITY, URINE      Result Value Ref Range   Osmolality, Ur 434  390 - 1090 mOsm/kg  OSMOLALITY      Result Value Ref Range   Osmolality 290  275 - 300 mOsm/kg  CREATININE, URINE, RANDOM      Result Value Ref Range   Creatinine, Urine 142.41    HEPARIN LEVEL (UNFRACTIONATED)      Result Value Ref Range   Heparin Unfractionated 1.10 (*) 0.30 - 0.70 IU/mL  APTT      Result Value Ref Range   aPTT 43 (*) 24 - 37 seconds  PROCALCITONIN      Result Value Ref Range   Procalcitonin 2.10                                                                                                       A/P: Pt with hx RLL PNA, elevated WBC,dyspnea/cough, complex loculated right pleural effusion with recent unsuccessful attempt at thoracentesis by CCM. Plan is for CT guided placement of a right chest drain on 5/18. Details/risks of procedure d/w pt with her understanding and consent  . Pt's xarelto and IV heparin have been held.

## 2014-05-01 NOTE — Progress Notes (Signed)
eLink Physician-Brief Progress Note Patient Name: [name] DOB: May 17, 1936 MRN: [id number]  Date of Service  05/01/2014   HPI/Events of Note   Patient with chronic AF with elevated HR and bp.  She has wprn lopressor ordered for HR but no specific parameter for HTN.  She is on lopressor at home.  eICU Interventions  Stop as needed lopressor Start lopressor 25 mg po bid scheduled.   Intervention Category Intermediate Interventions: Hypertension - evaluation and management  Melvia Heaps 05/01/2014, 5:48 PM

## 2014-05-01 NOTE — Procedures (Signed)
Thoracentesis Procedure Note  Pre-operative Diagnosis: R pleural effusion  Post-operative Diagnosis: R pleural hemothorax  Indications: progressive R pleural effusion  Procedure Details  Consent: Informed consent was obtained. Risks of the procedure were discussed including: infection, bleeding, pain, pneumothorax.  Under sterile conditions the patient was positioned.Chlorhexidene and sterile drapes were utilized.  1% buffered lidocaine was used to anesthetize the 8th posterior R  rib space. Only a small amount of blood could be expressed. A dressing was applied to the wound and wound care instructions were provided.   Findings Minimal  ml of bloody pleural fluid was obtained.    Complications:  None; patient tolerated the procedure well.          Condition: stable  Plan Call thoracic surgery  D/c heparin drip  Attending Attestation: I performed the procedure.  Burnett Harry WrightMD Beeper  [phone]  Cell  [phone]  If no response or cell goes to voicemail, call beeper [phone]

## 2014-05-01 NOTE — Progress Notes (Signed)
Pts rhythm changed from NSR to Atrial flutter at a rate between 95-120. Spoke with CCM doctor who ordered to give PRN dose of IV lopressor 5mg . Med given. Pt is currently resting now after the last dose of anti anxiety medication. Will continue to monitor.   Maree Krabbe RN, BSN

## 2014-05-01 NOTE — Progress Notes (Signed)
PULMONARY / CRITICAL CARE MEDICINE   Name: [name] MRN: [id number] DOB: August 17, 1936    ADMISSION DATE:  04/29/2014 CONSULTATION DATE:  5/16   REFERRING MD :  [facility]  PRIMARY SERVICE:  PCCM   CHIEF COMPLAINT:  RLL PNA/Effusion [name]   BRIEF PATIENT DESCRIPTION:  78 yoWF admitted to [facility] 5/15 for acute resp failure for RLL PNA , pleural effusion and atrial fib with rVR .Transfered to [facility] ICU 5/16 d/t worsening resp status , PCCM to admit .  RA office pt w/ RAD (nml PFT)   SIGNIFICANT EVENTS / STUDIES:  5/15 CT chest >neg for PE , RLL consolidation/mod effusion  5/16 transfer from [facility] to [facility] ICU    LINES / TUBES: piv  CULTURES: 5/15 BC x 2 >>   ANTIBIOTICS: 5/15 Vanc  5/15 Primaxin   SUBJECTIVE:   Pt with incessant cough. C/o R sided pleuritic cp  VITAL SIGNS: Temp:  [98.2 F (36.8 C)-98.4 F (36.9 C)] 98.4 F (36.9 C) (05/17 0000) Pulse Rate:  [40-103] 85 (05/17 0100) Resp:  [12-27] 13 (05/17 0100) BP: (84-158)/(50-101) 135/65 mmHg (05/17 0100) SpO2:  [85 %-95 %] 92 % (05/17 0135) FiO2 (%):  [55 %] 55 % (05/16 1450) Weight:  [68.9 kg (151 lb 14.4 oz)-69.4 kg (153 lb)] 68.9 kg (151 lb 14.4 oz) (05/17 0223) HEMODYNAMICS: cv stable   VENTILATOR SETTINGS: Vent Mode:  [-]  FiO2 (%):  [55 %] 55 % Now on [place] oxygen sats OK  INTAKE / OUTPUT: Intake/Output     05/16 0701 - 05/17 0700 05/17 0701 - 05/18 0700   P.O. 480    I.V. (mL/kg) 192 (2.8)    Other 1850    IV Piggyback 500    Total Intake(mL/kg) 3022 (43.9)    Urine (mL/kg/hr) 425 (0.3)    Total Output 425     Net +2597            PHYSICAL EXAMINATION: General:  Anxious  Neuro:  A/o x 3 follows commands  HEENT:  Intact  Cardiovascular:  RRR , no m/r/g  Lungs: diminished bs in bases  Abdomen:  Soft nt , bs + Musculoskeletal:  Intact . RLE distal incast Skin:  Intact   LABS:  CBC  Recent Labs Lab 04/29/14 2248 04/30/14 0525 05/01/14 0244  WBC 25.2* 24.6* 28.9*  HGB 12.9 13.0 13.2   HCT 35.4* 36.9 38.0  PLT 220 257 275   Coag's  Recent Labs Lab 05/01/14 0244  APTT 43*   BMET  Recent Labs Lab 04/29/14 2248 04/30/14 0525 05/01/14 0244  NA 126* 128* 130*  K 4.1 3.7 4.7  CL 86* 87* 87*  CO2 24 24 25   BUN 17 14 25*  CREATININE 0.59 0.56 0.81  GLUCOSE 133* 201* 163*   Electrolytes  Recent Labs Lab 04/29/14 2248 04/30/14 0525 05/01/14 0244  CALCIUM 8.7 8.3* 8.8   Sepsis Markers  Recent Labs Lab 04/30/14 1707 04/30/14 1709 05/01/14 0244  LATICACIDVEN 1.3  --   --   PROCALCITON  --  2.33 2.10   ABG No results found for this basename: PHART, PCO2ART, PO2ART,  in the last 168 hours Liver Enzymes  Recent Labs Lab 05/01/14 0244  AST 63*  ALT 45*  ALKPHOS 183*  BILITOT 0.6  ALBUMIN 3.0*   Cardiac Enzymes  Recent Labs Lab 04/29/14 2248 04/30/14 1707  PROBNP 1774.0* 2780.0*   Glucose No results found for this basename: GLUCAP,  in the last 168 hours  Imaging Ct Angio Chest Pe W/cm &/or Wo Cm  04/30/2014   CLINICAL DATA:  Recent ankle fracture, back pain, shortness of breath.  EXAM: CT ANGIOGRAPHY CHEST WITH CONTRAST  TECHNIQUE: Multidetector CT imaging of the chest was performed using the standard protocol during bolus administration of intravenous contrast. Multiplanar CT image reconstructions and MIPs were obtained to evaluate the vascular anatomy.  CONTRAST:  125mL OMNIPAQUE IOHEXOL 350 MG/ML SOLN  COMPARISON:  DG CHEST 1V PORT dated 04/29/2014  FINDINGS: Suboptimal contrast opacification of the main pulmonary artery (220 Hounsfield units), limiting assessment for subsegmental pulmonary embolism, no pulmonary arterial filling defects seen to the segmental branches. Main pulmonary artery is 2.8 cm in transaxial dimension.  The heart appears moderately enlarged, Coronary artery calcifications present. Pericardium is nonsuspicious. Thoracic aorta is normal in course and caliber with moderate calcific atherosclerosis. No lymphadenopathy by  CT size criteria.  Moderate right pleural effusion, lobulated in appearance could reflect loculation. Hypoenhancing right lower lobe consolidation. Trace left pleural effusion. Pulmonary vasculature engorgement. No pneumothorax. Mild effacement of the right lower lobe bronchi.  Included view of the abdomen is unremarkable. Severe degenerative change of the right shoulder with tiny calcifications which could reflect loose bodies. Moderate degenerative change of the thoracic spine, motion through the lower thoracic level results in spurious elongation of a single thoracic vertebra.  Review of the MIP images confirms the above findings.  IMPRESSION: Suboptimal contrast bolus timing with no convincing evidence of pulmonary arterial embolism to at least the segmental branches.  Moderate right pleural effusion, possibly loculated, with hypoenhancing underlying lung suggesting atelectasis and superimposed pneumonia.  Cardiomegaly and pulmonary vasculature congestion.   Electronically Signed   By: Elon Alas   On: 04/30/2014 01:55   Dg Chest Port 1 View  05/01/2014   CLINICAL DATA:  Respiratory distress.  EXAM: PORTABLE CHEST - 1 VIEW  COMPARISON:  Yesterday.  FINDINGS: Decreased inspiration. Stable enlarged cardiac silhouette. No gross change in prominence of the pulmonary vasculature and interstitial markings. Moderate-sized right pleural effusion, increased. Bilateral shoulder degenerative changes.  IMPRESSION: 1. Moderate-sized right pleural effusion, increased. 2. Stable cardiomegaly and interstitial pulmonary edema.   Electronically Signed   By: Enrique Sack M.D.   On: 05/01/2014 06:27   Dg Chest Port 1 View  04/30/2014   CLINICAL DATA:  Shortness of with cough and congestion  EXAM: PORTABLE CHEST - 1 VIEW  COMPARISON:  CT ANGIO CHEST W/CM &/OR WO/CM dated 04/30/2014; DG CHEST 1V PORT dated 04/29/2014  FINDINGS: The left lung is adequately inflated. On the right there is persistent volume loss that is  slightly less conspicuous than on yesterday's study. This is consistent with the known moderate-sized pleural effusion. The pulmonary interstitial markings have improved bilaterally. Cardiopericardial silhouette where visualized appears enlarged. The central pulmonary vascularity remains prominent. There is no pneumothorax. There are degenerative changes of both shoulders.  IMPRESSION: There has been slight interval improvement in the appearance of the pulmonary interstitium bilaterally. A moderate-sized right-sided pleural effusion remains. There remain findings of mild CHF.   Electronically Signed   By: David  Martinique   On: 04/30/2014 17:43   Dg Chest Port 1 View  04/29/2014   CLINICAL DATA:  Shortness of breath and cough with history of bronchitis  EXAM: PORTABLE CHEST - 1 VIEW  COMPARISON:  DG CHEST 1V PORT dated 04/01/2014  FINDINGS: There is new increased density in the right hemi thorax consistent with a pleural effusion. The hemidiaphragm is obscured. On the left the  lung is well-expanded. The interstitial markings are mildly increased. The cardiac silhouette is enlarged and the pulmonary vascularity is engorged. The trachea is midline. There are degenerative changes of both shoulders.  IMPRESSION: The findings are consistent with congestive heart failure with pulmonary interstitial edema. A moderate size right pleural effusion is suspected.   Electronically Signed   By: David  Martinique   On: 04/29/2014 23:33     CXR: 5/17 congestive heart failure with  pulmonary interstitial edema. A moderate size right pleural effusion     ASSESSMENT / PLAN:  PULMONARY A: Acute Respiratory failure, Hypoxic; Multifactorial secondary to PNA, Asthma exacerbation, Heart Failure. /pleural effusion -loculated   ?empyema   - has laryngeal voice? Some VCD going on as well  P:   O2 to keep sat >90%   Cont Nebs ipratropium, Xopenex.  D/c solumedrol with severe anxiety and fear of decreased immune status with  medrol/poss empyema Continue with IV lasix. As b/p /scr allow  Continue Vancomycin and zosyn.  thora this AM Check cxr in am   CARDIOVASCULAR A: Atrial Fib w/ RVR >now in NSR  CHF   P:  Tr bnp  Cont diuresis as b/p and scr allow  Check tsh  F/u  echo  Off xarelto, HOLD heparin drip for procedure Cont metoprolol to prn     RENAL A:  Hyponatremia  P:   Tr bmet ;  Replace electrolytes as indicated   GASTROINTESTINAL A:  GERD  P:   NPO  PPI   HEMATOLOGIC A:   P:  Follow h/h   INFECTIOUS A:  RLL PNA  P:   Continue IV abx  Follow cx date  Tr wbc and temp   ENDOCRINE A:  Hyperglycemia   P:   SSI   NEUROLOGIC A:  Anxiety /Agitation / Baseline fibromylagia - on xanax, oxycodone, zoloft, ultram at baseline Currently acute right chest pain due to  P:   Avoid oversedation , d/c morphine  But use fentanyl for pain prn Low dose xanax if needed for anxity Hold oxycodone, zoloft and ultram for now Monitor closley    MSK A: RLE in cast from recent surgery P Duplex LE for DVT, although CT Angio neg for PE it was a suboptimal study  TODAY'S SUMMARY: 78 yo former smoker with RLL PNA , pleural effusion with worsening resp distress . Plan thoracentesis this AM.    I have personally obtained a history, examined the patient, evaluated laboratory and imaging results, formulated the assessment and plan and placed orders.  CRITICAL CARE: The patient is critically ill with multiple organ systems failure and requires high complexity decision making for assessment and support, frequent evaluation and titration of therapies, application of advanced monitoring technologies and extensive interpretation of multiple databases. Critical Care Time devoted to patient care services described in this note is  30 minutes    Phillip Heal Beeper  [phone]  Cell  [phone]  If no response or cell goes to voicemail, call beeper [phone]   05/01/2014 8:05 AM

## 2014-05-01 NOTE — Progress Notes (Addendum)
[facility] for heparin Indication: atrial fibrillation  Allergies  Allergen Reactions  . Aspirin Other (See Comments)    REACTION: regular strength causes "heart to beat fast"  . Captopril Hypertension  . Naproxen Other (See Comments)    Tongue swelling  . Penicillins     Swelling around site  . Sulfonamide Derivatives Hives  . Clindamycin/Lincomycin     Patient Measurements: Height: 5' (152.4 cm) Weight: 151 lb 14.4 oz (68.9 kg) IBW/kg (Calculated) : 45.5 Heparin Dosing Weight: 60kg  Vital Signs: Temp: 98.4 F (36.9 C) (05/17 0000) Temp src: Oral (05/17 0000) BP: 135/65 mmHg (05/17 0100) Pulse Rate: 85 (05/17 0100)  Labs:  Recent Labs  04/29/14 2248 04/30/14 0525 05/01/14 0244  HGB 12.9 13.0 13.2  HCT 35.4* 36.9 38.0  PLT 220 257 275  APTT  --   --  43*  HEPARINUNFRC  --   --  1.10*  CREATININE 0.59 0.56 0.81    Estimated Creatinine Clearance: 51.2 ml/min (by C-G formula based on Cr of 0.81).  Assessment: 78 yo female with PNA/pleural effusion, Xarelto on hold for thoracentesis, for heparin.  Heparin level remains elevated due to Xarelto  Goal of Therapy:  Heparin level 0.3-0.7 units/ml aPTT 66-102 seconds Monitor platelets by anticoagulation protocol: Yes   Plan:  Increase Heparin 1000 units/hr Check aPTTl in 8 hours.  [facility] Curb Jonatha Gagen 05/01/2014,5:05 AM

## 2014-05-02 ENCOUNTER — Inpatient Hospital Stay (HOSPITAL_COMMUNITY): Payer: PRIVATE HEALTH INSURANCE

## 2014-05-02 LAB — APTT: aPTT: 37 seconds (ref 24–37)

## 2014-05-02 LAB — PROTIME-INR
INR: 1.12 (ref 0.00–1.49)
Prothrombin Time: 14.2 seconds (ref 11.6–15.2)

## 2014-05-02 LAB — CBC
HCT: 34.9 % — ABNORMAL LOW (ref 36.0–46.0)
Hemoglobin: 12.1 g/dL (ref 12.0–15.0)
MCH: 32.6 pg (ref 26.0–34.0)
MCHC: 34.7 g/dL (ref 30.0–36.0)
MCV: 94.1 fL (ref 78.0–100.0)
Platelets: 242 10*3/uL (ref 150–400)
RBC: 3.71 MIL/uL — ABNORMAL LOW (ref 3.87–5.11)
RDW: 14.4 % (ref 11.5–15.5)
WBC: 25 10*3/uL — ABNORMAL HIGH (ref 4.0–10.5)

## 2014-05-02 LAB — PROCALCITONIN: Procalcitonin: 0.85 ng/mL

## 2014-05-02 LAB — BASIC METABOLIC PANEL
BUN: 27 mg/dL — ABNORMAL HIGH (ref 6–23)
CO2: 26 mEq/L (ref 19–32)
Calcium: 8 mg/dL — ABNORMAL LOW (ref 8.4–10.5)
Chloride: 90 mEq/L — ABNORMAL LOW (ref 96–112)
Creatinine, Ser: 0.73 mg/dL (ref 0.50–1.10)
GFR calc Af Amer: >90 mL/min (ref >90)
GFR calc non Af Amer: 81 mL/min — ABNORMAL LOW (ref >90)
Glucose, Bld: 97 mg/dL (ref 70–99)
Potassium: 4.4 mEq/L (ref 3.7–5.3)
Sodium: 130 mEq/L — ABNORMAL LOW (ref 137–147)

## 2014-05-02 LAB — VANCOMYCIN, TROUGH: Vancomycin Tr: 13 ug/mL (ref 10.0–20.0)

## 2014-05-02 MED ORDER — DILTIAZEM HCL 30 MG PO TABS
30.0000 mg | ORAL_TABLET | Freq: Four times a day (QID) | ORAL | Status: DC
  Administered 2014-05-02 – 2014-05-03 (×5): 30 mg via ORAL
  Filled 2014-05-02 (×9): qty 1

## 2014-05-02 MED ORDER — VANCOMYCIN HCL IN DEXTROSE 1-5 GM/200ML-% IV SOLN
1000.0000 mg | Freq: Two times a day (BID) | INTRAVENOUS | Status: DC
  Administered 2014-05-02 – 2014-05-10 (×16): 1000 mg via INTRAVENOUS
  Filled 2014-05-02 (×20): qty 200

## 2014-05-02 MED ORDER — FENTANYL CITRATE 0.05 MG/ML IJ SOLN
INTRAMUSCULAR | Status: AC | PRN
  Administered 2014-05-02 (×2): 50 ug via INTRAVENOUS

## 2014-05-02 MED ORDER — DILTIAZEM LOAD VIA INFUSION
5.0000 mg | Freq: Once | INTRAVENOUS | Status: AC
  Administered 2014-05-02: 5 mg via INTRAVENOUS
  Filled 2014-05-02: qty 5

## 2014-05-02 MED ORDER — MIDAZOLAM HCL 2 MG/2ML IJ SOLN
INTRAMUSCULAR | Status: AC | PRN
  Administered 2014-05-02 (×2): 1 mg via INTRAVENOUS

## 2014-05-02 MED ORDER — DILTIAZEM LOAD VIA INFUSION
5.0000 mg | Freq: Once | INTRAVENOUS | Status: AC
  Administered 2014-05-02: 5 mg via INTRAVENOUS

## 2014-05-02 MED ORDER — FUROSEMIDE 10 MG/ML IJ SOLN
40.0000 mg | Freq: Three times a day (TID) | INTRAMUSCULAR | Status: AC
  Administered 2014-05-02 (×2): 40 mg via INTRAVENOUS
  Filled 2014-05-02 (×2): qty 4

## 2014-05-02 MED ORDER — FENTANYL CITRATE 0.05 MG/ML IJ SOLN
INTRAMUSCULAR | Status: AC
  Filled 2014-05-02: qty 4

## 2014-05-02 MED ORDER — MIDAZOLAM HCL 2 MG/2ML IJ SOLN
INTRAMUSCULAR | Status: AC
  Filled 2014-05-02: qty 4

## 2014-05-02 MED ORDER — LIDOCAINE HCL 1 % IJ SOLN
INTRAMUSCULAR | Status: AC
  Filled 2014-05-02: qty 10

## 2014-05-02 MED ORDER — ONDANSETRON HCL 4 MG/2ML IJ SOLN
4.0000 mg | Freq: Once | INTRAMUSCULAR | Status: AC
  Administered 2014-05-02: 4 mg via INTRAVENOUS
  Filled 2014-05-02: qty 2

## 2014-05-02 MED ORDER — HYDROCODONE-ACETAMINOPHEN 5-325 MG PO TABS
1.0000 | ORAL_TABLET | ORAL | Status: DC | PRN
  Administered 2014-05-02 – 2014-05-03 (×3): 2 via ORAL
  Filled 2014-05-02 (×3): qty 2

## 2014-05-02 NOTE — Care Management Note (Addendum)
    Page 1 of 1   05/11/2014     7:17:09 PM CARE MANAGEMENT NOTE 05/11/2014  Patient:  [name], [name]   Account Number:  [id number]  Date Initiated:  05/02/2014  Documentation initiated by:  Elissa Hefty  Subjective/Objective Assessment:   adm w heart failure     Action/Plan:   from nsg facility   Anticipated DC Date:  05/12/2014   Anticipated DC Plan:  [place]  In-house referral  Clinical Social Worker      DC Planning Services  CM consult      Choice offered to / List presented to:             Status of service:  Completed, signed off Medicare Important Message given?   (If response is "NO", the following Medicare IM given date fields will be blank) Date Medicare IM given:   Date Additional Medicare IM given:  05/11/2014  Discharge Disposition:  [place]  Per UR Regulation:  Reviewed for med. necessity/level of care/duration of stay  If discussed at [facility] of Stay Meetings, dates discussed:    Comments:  Contact:  Atkins,Timothy Son [phone]   [phone]  05/11/14 1916 Tomi Bamberger RN ,BSN [phone] patient 's cxr showed increased resp secretions, will need to see if this is clear by tomorrow for possible dc to snf per MD note.  05-06-14 9:55am [name], RNBSN [phone] Patient looks good - awake and alert.  Prior to ankle injury lived at home alone, independent.  Agreeable to go to SNF for ST rehab post but does not want to go back to [facility].  SW updated.  CM will continue to follow.

## 2014-05-02 NOTE — Progress Notes (Signed)
PULMONARY / CRITICAL CARE MEDICINE   Name: [name] MRN: [id number] DOB: 08/26/1936    ADMISSION DATE:  04/29/2014 CONSULTATION DATE:  5/16   REFERRING MD :  [facility]  PRIMARY SERVICE:  PCCM   CHIEF COMPLAINT:  RLL PNA/Effusion [name]   BRIEF PATIENT DESCRIPTION:  78 yoWF admitted to [facility] 5/15 for acute resp failure for RLL PNA , pleural effusion and atrial fib with rVR .Transfered to [facility] ICU 5/16 d/t worsening resp status , PCCM to admit .  RA office pt w/ RAD (nml PFT)   SIGNIFICANT EVENTS / STUDIES:  5/15 CT chest >neg for PE , RLL consolidation/mod effusion  5/16 transfer from [facility] to [facility] ICU    LINES / TUBES: piv  CULTURES: 5/15 BC x 2 >>   ANTIBIOTICS: 5/15 Vanc  5/15 Primaxin   SUBJECTIVE:   No events overnight  VITAL SIGNS: Temp:  [97.6 F (36.4 C)-99.1 F (37.3 C)] 98.1 F (36.7 C) (05/18 0710) Pulse Rate:  [35-151] 100 (05/18 1000) Resp:  [11-28] 17 (05/18 1000) BP: (95-158)/(47-118) 137/74 mmHg (05/18 1000) SpO2:  [85 %-100 %] 97 % (05/18 1000) Weight:  [158 lb 8.2 oz (71.9 kg)] 158 lb 8.2 oz (71.9 kg) (05/18 0500) HEMODYNAMICS: cv stable   VENTILATOR SETTINGS:   Now on [place] oxygen sats OK  INTAKE / OUTPUT: Intake/Output     05/17 0701 - 05/18 0700 05/18 0701 - 05/19 0700   P.O. 590    I.V. (mL/kg) 253 (3.5) 30 (0.4)   Other     IV Piggyback 700    Total Intake(mL/kg) 1543 (21.5) 30 (0.4)   Urine (mL/kg/hr) 1300 (0.8) 75 (0.3)   Total Output 1300 75   Net +243 -45         PHYSICAL EXAMINATION: General:  Anxious, respiratory distress Neuro:  A/o x 3 follows commands  HEENT:  Intact  Cardiovascular:  RRR , no m/r/g  Lungs: diminished bs in bases  Abdomen:  Soft nt , bs + Musculoskeletal:  Intact . RLE distal incast Skin:  Intact   LABS:  CBC  Recent Labs Lab 04/30/14 0525 05/01/14 0244 05/02/14 0532  WBC 24.6* 28.9* 25.0*  HGB 13.0 13.2 12.1  HCT 36.9 38.0 34.9*  PLT 257 275 242   Coag's  Recent Labs Lab  05/01/14 0244 05/02/14 0532  APTT 43* 37  INR  --  1.12   BMET  Recent Labs Lab 04/30/14 0525 05/01/14 0244 05/02/14 0532  NA 128* 130* 130*  K 3.7 4.7 4.4  CL 87* 87* 90*  CO2 24 25 26   BUN 14 25* 27*  CREATININE 0.56 0.81 0.73  GLUCOSE 201* 163* 97   Electrolytes  Recent Labs Lab 04/30/14 0525 05/01/14 0244 05/02/14 0532  CALCIUM 8.3* 8.8 8.0*   Sepsis Markers  Recent Labs Lab 04/30/14 1707 04/30/14 1709 05/01/14 0244 05/02/14 0532  LATICACIDVEN 1.3  --   --   --   PROCALCITON  --  2.33 2.10 0.85   ABG No results found for this basename: PHART, PCO2ART, PO2ART,  in the last 168 hours Liver Enzymes  Recent Labs Lab 05/01/14 0244  AST 63*  ALT 45*  ALKPHOS 183*  BILITOT 0.6  ALBUMIN 3.0*   Cardiac Enzymes  Recent Labs Lab 04/29/14 2248 04/30/14 1707  PROBNP 1774.0* 2780.0*   Glucose No results found for this basename: GLUCAP,  in the last 168 hours  Imaging Dg Chest Port 1 View  05/02/2014   CLINICAL DATA:  Cough and wheezing  EXAM: PORTABLE CHEST - 1 VIEW  COMPARISON:  May 01, 2014  FINDINGS: There is persistent effusion with consolidation involving much of the right middle and lower lobes, stable. Left lung remains clear. Heart is mildly enlarged with normal pulmonary vascularity, stable. No adenopathy. There is arthropathy in both shoulders. No pneumothorax.  IMPRESSION: Right effusion. Consolidation in portions of right middle and lower lobes. Left lung remains clear. No change in cardiac silhouette.   Electronically Signed   By: Lowella Grip M.D.   On: 05/02/2014 07:29   Dg Chest Port 1 View  05/01/2014   CLINICAL DATA:  Right pleural effusion; cough and congestion  EXAM: PORTABLE CHEST - 1 VIEW  COMPARISON:  Study obtained earlier in the day  FINDINGS: There is a persistent right pleural effusion with consolidation in the right base. Left lung is clear. Heart is mildly enlarged with normal pulmonary vascularity. There is atherosclerotic  change in the aorta. No adenopathy. No pneumothorax.  IMPRESSION: Persistent right pleural effusion with right base consolidation. Left lung clear. Stable cardiac prominence.   Electronically Signed   By: Lowella Grip M.D.   On: 05/01/2014 11:16   Dg Chest Port 1 View  05/01/2014   CLINICAL DATA:  Respiratory distress.  EXAM: PORTABLE CHEST - 1 VIEW  COMPARISON:  Yesterday.  FINDINGS: Decreased inspiration. Stable enlarged cardiac silhouette. No gross change in prominence of the pulmonary vasculature and interstitial markings. Moderate-sized right pleural effusion, increased. Bilateral shoulder degenerative changes.  IMPRESSION: 1. Moderate-sized right pleural effusion, increased. 2. Stable cardiomegaly and interstitial pulmonary edema.   Electronically Signed   By: Enrique Sack M.D.   On: 05/01/2014 06:27   Dg Chest Port 1 View  04/30/2014   CLINICAL DATA:  Shortness of with cough and congestion  EXAM: PORTABLE CHEST - 1 VIEW  COMPARISON:  CT ANGIO CHEST W/CM &/OR WO/CM dated 04/30/2014; DG CHEST 1V PORT dated 04/29/2014  FINDINGS: The left lung is adequately inflated. On the right there is persistent volume loss that is slightly less conspicuous than on yesterday's study. This is consistent with the known moderate-sized pleural effusion. The pulmonary interstitial markings have improved bilaterally. Cardiopericardial silhouette where visualized appears enlarged. The central pulmonary vascularity remains prominent. There is no pneumothorax. There are degenerative changes of both shoulders.  IMPRESSION: There has been slight interval improvement in the appearance of the pulmonary interstitium bilaterally. A moderate-sized right-sided pleural effusion remains. There remain findings of mild CHF.   Electronically Signed   By: David  Martinique   On: 04/30/2014 17:43     CXR: 5/17 congestive heart failure with  pulmonary interstitial edema. A moderate size right pleural effusion     ASSESSMENT /  PLAN:  PULMONARY A: Acute Respiratory failure, Hypoxic; Multifactorial secondary to PNA, Asthma exacerbation, Heart Failure. /pleural effusion -loculated   ?empyema   - has laryngeal voice? Some VCD going on as well  P:   - O2 to keep sat >90%   - Cont Nebs ipratropium, Xopenex.  - D/ced solumedrol with severe anxiety and fear of decreased immune status with medrol/poss empyema, this is not a COPD exacerbation. - Increase lasix to 40 mg IV q8 x2 doses. - IR to place chest tube today.  - Continue Vancomycin and primaxin.  - Check cxr in am.  CARDIOVASCULAR A: Atrial Fib w/ RVR >now in NSR  CHF   P:  - Cont diuresis - F/u echo. - Off xarelto, HOLD heparin drip for procedure -  Cont metoprolol to prn  - Add cardizem for HR control.  RENAL A:  Hyponatremia  P:   - BMET in AM. - Replace electrolytes as indicated. - Lasix 40 mg IV q8 x2 doses.  GASTROINTESTINAL A:  GERD  P:   - NPO for procedure then may eat. - PPI.  HEMATOLOGIC A:   P:  - Follow H/H.  INFECTIOUS A:  RLL PNA  P:   - Continue IV abx  - Follow cx date  - Will need culture from fluid once chest tube is in. - If unable to drain then will need VATS.  ENDOCRINE A:  Hyperglycemia   P:   - SSI.  NEUROLOGIC A:  Anxiety /Agitation / Baseline fibromylagia - on xanax, oxycodone, zoloft, ultram at baseline Currently acute right chest pain due to  P:   - Avoid oversedation , d/c morphine  But use fentanyl for pain prn. - Low dose xanax if needed for anxity. - Hold oxycodone, zoloft and ultram for now. - Monitor closley.  MSK A: RLE in cast from recent surgery P Duplex LE for DVT, although CT Angio neg for PE it was a suboptimal study  TODAY'S SUMMARY: 78 yo former smoker with RLL PNA , pleural effusion with worsening resp distress.  Chest tube per IR today, if ineffective then will need VATS.  Spoke with patient and family extensively today, explained the full case and recommended they discuss  patient's wishes incase she goes to the OR and we are unable to get off vent which they are currently doing.  I have personally obtained a history, examined the patient, evaluated laboratory and imaging results, formulated the assessment and plan and placed orders.  CRITICAL CARE: The patient is critically ill with multiple organ systems failure and requires high complexity decision making for assessment and support, frequent evaluation and titration of therapies, application of advanced monitoring technologies and extensive interpretation of multiple databases. Critical Care Time devoted to patient care services described in this note is  30 minutes   Rush Farmer, M.D. [facility] Pulmonary/Critical Care Medicine. Pager: [phone]. After hours pager: [phone].

## 2014-05-02 NOTE — Progress Notes (Signed)
ANTIBIOTIC CONSULT NOTE - FOLLOW UP  Pharmacy Consult for vancomycin Indication: PNA  Allergies  Allergen Reactions  . Aspirin Other (See Comments)    REACTION: regular strength causes "heart to beat fast"  . Captopril Hypertension  . Naproxen Other (See Comments)    Tongue swelling  . Penicillins     Swelling around site  . Sulfonamide Derivatives Hives  . Clindamycin/Lincomycin     Patient Measurements: Height: 5' (152.4 cm) Weight: 158 lb 8.2 oz (71.9 kg) IBW/kg (Calculated) : 45.5  Vital Signs: Temp: 99.1 F (37.3 C) (05/18 1128) Temp src: Oral (05/18 1200) BP: 104/75 mmHg (05/18 1226) Pulse Rate: 111 (05/18 1226) Intake/Output from previous day: 05/17 0701 - 05/18 0700 In: 1543 [P.O.:590; I.V.:253; IV Piggyback:700] Out: 1300 [Urine:1300] Intake/Output from this shift: Total I/O In: 55 [I.V.:55] Out: 900 [Urine:900]  Labs:  Recent Labs  04/30/14 0525 04/30/14 1904 05/01/14 0244 05/02/14 0532  WBC 24.6*  --  28.9* 25.0*  HGB 13.0  --  13.2 12.1  PLT 257  --  275 242  LABCREA  --  142.41  --   --   CREATININE 0.56  --  0.81 0.73   Estimated Creatinine Clearance: 53 ml/min (by C-G formula based on Cr of 0.73).  Recent Labs  05/02/14 1004  [place] 13.0     Microbiology: Recent Results (from the past 720 hour(s))  MRSA PCR SCREENING     Status: Abnormal   Collection Time    04/30/14  9:15 AM      Result Value Ref Range Status   MRSA by PCR POSITIVE (*) NEGATIVE Final   Comment:            The GeneXpert MRSA Assay (FDA     approved for NASAL specimens     only), is one component of a     comprehensive MRSA colonization     surveillance program. It is not     intended to diagnose MRSA     infection nor to guide or     monitor treatment for     MRSA infections.     RESULT CALLED TO, READ BACK BY AND VERIFIED WITH:     A.STONE AT 1125 ON 04/30/14 BY S.VANHOORNE  CULTURE, BLOOD (ROUTINE X 2)     Status: None   Collection Time    04/30/14   9:32 AM      Result Value Ref Range Status   Specimen Description BLOOD RIGHT ARM   Final   Special Requests BOTTLES DRAWN AEROBIC ONLY 8CC BOTTLE   Final   Culture NO GROWTH 2 DAYS   Final   Report Status PENDING   Incomplete  CULTURE, BLOOD (ROUTINE X 2)     Status: None   Collection Time    04/30/14  9:49 AM      Result Value Ref Range Status   Specimen Description BLOOD LEFT ARM   Final   Special Requests     Final   Value: BOTTLES DRAWN AEROBIC AND ANAEROBIC 6CC EACH BOTTLE   Culture NO GROWTH 2 DAYS   Final   Report Status PENDING   Incomplete    Anti-infectives   Start     Dose/Rate Route Frequency Ordered Stop   04/30/14 2300  vancomycin (VANCOCIN) IVPB 750 mg/150 ml premix     750 mg 150 mL/hr over 60 Minutes Intravenous Every 12 hours 04/30/14 0931     04/30/14 1200  piperacillin-tazobactam (ZOSYN) IVPB 3.375 g  Status:  Discontinued     3.375 g 12.5 mL/hr over 240 Minutes Intravenous Every 8 hours 04/30/14 0931 04/30/14 0938   04/30/14 1200  imipenem-cilastatin (PRIMAXIN) 250 mg in sodium chloride 0.9 % 100 mL IVPB     250 mg 200 mL/hr over 30 Minutes Intravenous 4 times per day 04/30/14 1000     04/30/14 1100  vancomycin (VANCOCIN) IVPB 1000 mg/200 mL premix     1,000 mg 200 mL/hr over 60 Minutes Intravenous  Once 04/30/14 0931 04/30/14 1258   04/30/14 1000  levofloxacin (LEVAQUIN) tablet 500 mg  Status:  Discontinued     500 mg Oral Daily 04/30/14 0224 04/30/14 0859      Assessment: 78 yo female with PNA on primaxin and vancomycin.  WBC= 25, afebrile, SCr= 0.73 and a vancomycin trough today was 13 (below goal).  vanc 5/15>> primaxin 5/15>>  5/15 bld >> pending 5/16 MRSA pcr pos 5/16 RVP >> collected   Goal of Therapy:  Vancomycin trough level 15-20 mcg/ml  Plan:  -Change vancomycin to 1000mg  IV q12h -No imipenem dose changes needed -Will follow renal function, cultures and clinical progress  Hildred Laser, Pharm D 05/02/2014 12:33 PM

## 2014-05-02 NOTE — Procedures (Signed)
CT R #50F pleural drain placement ~16ml serosanguinous fluid out, sample sent for GS, C&S No complication No blood loss. See complete dictation in [facility].

## 2014-05-03 ENCOUNTER — Inpatient Hospital Stay (HOSPITAL_COMMUNITY): Payer: PRIVATE HEALTH INSURANCE | Admitting: Anesthesiology

## 2014-05-03 ENCOUNTER — Inpatient Hospital Stay (HOSPITAL_COMMUNITY): Payer: PRIVATE HEALTH INSURANCE

## 2014-05-03 ENCOUNTER — Encounter (HOSPITAL_COMMUNITY)
Admission: EM | Disposition: A | Discharge status: Skilled Nursing Facility | Payer: Self-pay | Source: Home / Self Care | Attending: Internal Medicine

## 2014-05-03 ENCOUNTER — Encounter (HOSPITAL_COMMUNITY): Payer: PRIVATE HEALTH INSURANCE | Admitting: Anesthesiology

## 2014-05-03 ENCOUNTER — Encounter (HOSPITAL_COMMUNITY): Payer: Self-pay | Admitting: Anesthesiology

## 2014-05-03 DIAGNOSIS — J869 Pyothorax without fistula: Secondary | ICD-10-CM

## 2014-05-03 HISTORY — PX: DECORTICATION: SHX5101

## 2014-05-03 HISTORY — PX: VIDEO ASSISTED THORACOSCOPY (VATS)/EMPYEMA: SHX6172

## 2014-05-03 HISTORY — PX: VIDEO BRONCHOSCOPY: SHX5072

## 2014-05-03 LAB — RESPIRATORY VIRUS PANEL
Adenovirus: NOT DETECTED
Influenza A H1: NOT DETECTED
Influenza A H3: NOT DETECTED
Influenza A: NOT DETECTED
Influenza B: NOT DETECTED
Metapneumovirus: NOT DETECTED
Parainfluenza 1: NOT DETECTED
Parainfluenza 2: NOT DETECTED
Parainfluenza 3: DETECTED — AB
Respiratory Syncytial Virus A: NOT DETECTED
Respiratory Syncytial Virus B: NOT DETECTED
Rhinovirus: NOT DETECTED

## 2014-05-03 LAB — BODY FLUID CELL COUNT WITH DIFFERENTIAL
Eos, Fluid: 0 %
Lymphs, Fluid: 4 %
Monocyte-Macrophage-Serous Fluid: 9 % — ABNORMAL LOW (ref 50–90)
Neutrophil Count, Fluid: 87 % — ABNORMAL HIGH (ref 0–25)
Total Nucleated Cell Count, Fluid: 3933 cu mm — ABNORMAL HIGH (ref 0–1000)

## 2014-05-03 LAB — PROTEIN, TOTAL: Total Protein: 7 g/dL (ref 6.0–8.3)

## 2014-05-03 LAB — CBC
HCT: 38.1 % (ref 36.0–46.0)
Hemoglobin: 12.9 g/dL (ref 12.0–15.0)
MCH: 31.4 pg (ref 26.0–34.0)
MCHC: 33.9 g/dL (ref 30.0–36.0)
MCV: 92.7 fL (ref 78.0–100.0)
Platelets: 269 10*3/uL (ref 150–400)
RBC: 4.11 MIL/uL (ref 3.87–5.11)
RDW: 14.4 % (ref 11.5–15.5)
WBC: 20.2 10*3/uL — ABNORMAL HIGH (ref 4.0–10.5)

## 2014-05-03 LAB — POCT I-STAT 3, ART BLOOD GAS (G3+)
Acid-Base Excess: 4 mmol/L — ABNORMAL HIGH (ref 0.0–2.0)
Bicarbonate: 31.3 mEq/L — ABNORMAL HIGH (ref 20.0–24.0)
O2 Saturation: 94 %
Patient temperature: 98.6
TCO2: 33 mmol/L (ref 0–100)
pCO2 arterial: 55.1 mmHg — ABNORMAL HIGH (ref 35.0–45.0)
pH, Arterial: 7.362 (ref 7.350–7.450)
pO2, Arterial: 77 mmHg — ABNORMAL LOW (ref 80.0–100.0)

## 2014-05-03 LAB — GLUCOSE, SEROUS FLUID: Glucose, Fluid: <20 mg/dL

## 2014-05-03 LAB — LACTATE DEHYDROGENASE, PLEURAL OR PERITONEAL FLUID: LD, Fluid: 4052 U/L — ABNORMAL HIGH (ref 3–23)

## 2014-05-03 LAB — GLUCOSE, CAPILLARY: Glucose-Capillary: 165 mg/dL — ABNORMAL HIGH (ref 70–99)

## 2014-05-03 LAB — TYPE AND SCREEN
ABO/RH(D): O POS
Antibody Screen: NEGATIVE

## 2014-05-03 LAB — LACTATE DEHYDROGENASE: LDH: 291 U/L — ABNORMAL HIGH (ref 94–250)

## 2014-05-03 LAB — CHOLESTEROL, TOTAL: Cholesterol: 126 mg/dL (ref 0–200)

## 2014-05-03 LAB — PROTEIN, BODY FLUID: Total protein, fluid: 4.9 g/dL

## 2014-05-03 LAB — ABO/RH: ABO/RH(D): O POS

## 2014-05-03 LAB — HEPARIN LEVEL (UNFRACTIONATED): Heparin Unfractionated: 0.32 IU/mL (ref 0.30–0.70)

## 2014-05-03 SURGERY — BRONCHOSCOPY, VIDEO-ASSISTED
Anesthesia: General | Site: Chest | Laterality: Right

## 2014-05-03 MED ORDER — MIDAZOLAM HCL 2 MG/2ML IJ SOLN
INTRAMUSCULAR | Status: AC
  Filled 2014-05-03: qty 2

## 2014-05-03 MED ORDER — METOCLOPRAMIDE HCL 5 MG/ML IJ SOLN
10.0000 mg | Freq: Four times a day (QID) | INTRAMUSCULAR | Status: AC
  Administered 2014-05-03 – 2014-05-04 (×4): 10 mg via INTRAVENOUS
  Filled 2014-05-03 (×4): qty 2

## 2014-05-03 MED ORDER — PROPOFOL 10 MG/ML IV BOLUS
INTRAVENOUS | Status: AC
  Filled 2014-05-03: qty 20

## 2014-05-03 MED ORDER — POTASSIUM CHLORIDE 10 MEQ/50ML IV SOLN: 10.0000 meq | Freq: Every day | INTRAVENOUS | Status: DC | PRN

## 2014-05-03 MED ORDER — LEVALBUTEROL HCL 0.63 MG/3ML IN NEBU
0.6300 mg | INHALATION_SOLUTION | Freq: Three times a day (TID) | RESPIRATORY_TRACT | Status: DC
  Administered 2014-05-03: 0.63 mg via RESPIRATORY_TRACT
  Filled 2014-05-03 (×4): qty 3

## 2014-05-03 MED ORDER — CHLORHEXIDINE GLUCONATE 0.12 % MT SOLN
15.0000 mL | Freq: Two times a day (BID) | OROMUCOSAL | Status: DC
  Administered 2014-05-03 – 2014-05-06 (×5): 15 mL via OROMUCOSAL
  Filled 2014-05-03 (×5): qty 15

## 2014-05-03 MED ORDER — 0.9 % SODIUM CHLORIDE (POUR BTL) OPTIME
TOPICAL | Status: DC | PRN
  Administered 2014-05-03: 2000 mL

## 2014-05-03 MED ORDER — ACETAMINOPHEN 160 MG/5ML PO SOLN
1000.0000 mg | Freq: Four times a day (QID) | ORAL | Status: AC
  Administered 2014-05-03: 1000 mg via ORAL
  Filled 2014-05-03: qty 40.6

## 2014-05-03 MED ORDER — ROCURONIUM BROMIDE 50 MG/5ML IV SOLN
INTRAVENOUS | Status: AC
  Filled 2014-05-03: qty 1

## 2014-05-03 MED ORDER — HEPARIN (PORCINE) IN NACL 100-0.45 UNIT/ML-% IJ SOLN
1000.0000 [IU]/h | INTRAMUSCULAR | Status: DC
  Filled 2014-05-03: qty 250

## 2014-05-03 MED ORDER — FENTANYL CITRATE 0.05 MG/ML IJ SOLN
50.0000 ug | Freq: Once | INTRAMUSCULAR | Status: AC
  Administered 2014-05-03: 50 ug via INTRAVENOUS
  Filled 2014-05-03: qty 2

## 2014-05-03 MED ORDER — BIOTENE DRY MOUTH MT LIQD
15.0000 mL | Freq: Four times a day (QID) | OROMUCOSAL | Status: DC
  Administered 2014-05-03 – 2014-05-06 (×12): 15 mL via OROMUCOSAL

## 2014-05-03 MED ORDER — ONDANSETRON HCL 4 MG/2ML IJ SOLN
4.0000 mg | Freq: Four times a day (QID) | INTRAMUSCULAR | Status: DC | PRN
  Administered 2014-05-04 – 2014-05-12 (×6): 4 mg via INTRAVENOUS
  Filled 2014-05-03 (×6): qty 2

## 2014-05-03 MED ORDER — ACETAMINOPHEN 500 MG PO TABS
1000.0000 mg | ORAL_TABLET | Freq: Four times a day (QID) | ORAL | Status: AC
  Administered 2014-05-03 – 2014-05-07 (×12): 1000 mg via ORAL
  Filled 2014-05-03 (×19): qty 2

## 2014-05-03 MED ORDER — ONDANSETRON HCL 4 MG/2ML IJ SOLN
INTRAMUSCULAR | Status: AC
  Filled 2014-05-03: qty 2

## 2014-05-03 MED ORDER — VANCOMYCIN HCL IN DEXTROSE 1-5 GM/200ML-% IV SOLN: 1000.0000 mg | Freq: Two times a day (BID) | INTRAVENOUS | Status: DC

## 2014-05-03 MED ORDER — LACTATED RINGERS IV SOLN
INTRAVENOUS | Status: DC | PRN
  Administered 2014-05-03: 15:00:00 via INTRAVENOUS

## 2014-05-03 MED ORDER — BISACODYL 5 MG PO TBEC
10.0000 mg | DELAYED_RELEASE_TABLET | Freq: Every day | ORAL | Status: DC
  Administered 2014-05-07 – 2014-05-14 (×8): 10 mg via ORAL
  Filled 2014-05-03 (×9): qty 2

## 2014-05-03 MED ORDER — FENTANYL BOLUS VIA INFUSION
25.0000 ug | INTRAVENOUS | Status: DC | PRN
  Administered 2014-05-04: 25 ug via INTRAVENOUS
  Administered 2014-05-04: 50 ug via INTRAVENOUS
  Filled 2014-05-03: qty 50

## 2014-05-03 MED ORDER — IPRATROPIUM BROMIDE 0.02 % IN SOLN
0.5000 mg | Freq: Three times a day (TID) | RESPIRATORY_TRACT | Status: DC
  Administered 2014-05-03 – 2014-05-04 (×4): 0.5 mg via RESPIRATORY_TRACT
  Filled 2014-05-03 (×4): qty 2.5

## 2014-05-03 MED ORDER — PANTOPRAZOLE SODIUM 40 MG IV SOLR
40.0000 mg | Freq: Every day | INTRAVENOUS | Status: DC
  Administered 2014-05-04 – 2014-05-06 (×3): 40 mg via INTRAVENOUS
  Filled 2014-05-03 (×4): qty 40

## 2014-05-03 MED ORDER — DEXTROSE-NACL 5-0.45 % IV SOLN
INTRAVENOUS | Status: DC
  Administered 2014-05-03 – 2014-05-06 (×3): via INTRAVENOUS

## 2014-05-03 MED ORDER — SENNOSIDES-DOCUSATE SODIUM 8.6-50 MG PO TABS
1.0000 | ORAL_TABLET | Freq: Every day | ORAL | Status: DC
Start: 2014-05-03 — End: 2014-05-14
  Administered 2014-05-03 – 2014-05-13 (×9): 1 via ORAL
  Filled 2014-05-03 (×12): qty 1

## 2014-05-03 MED ORDER — DILTIAZEM 12 MG/ML ORAL SUSPENSION
30.0000 mg | Freq: Four times a day (QID) | ORAL | Status: AC
  Administered 2014-05-03 – 2014-05-05 (×7): 30 mg via ORAL
  Filled 2014-05-03 (×11): qty 3

## 2014-05-03 MED ORDER — LIDOCAINE HCL (CARDIAC) 20 MG/ML IV SOLN
INTRAVENOUS | Status: DC | PRN
  Administered 2014-05-03: 50 mg via INTRAVENOUS

## 2014-05-03 MED ORDER — ROCURONIUM BROMIDE 100 MG/10ML IV SOLN
INTRAVENOUS | Status: DC | PRN
  Administered 2014-05-03: 10 mg via INTRAVENOUS
  Administered 2014-05-03: 50 mg via INTRAVENOUS

## 2014-05-03 MED ORDER — LEVALBUTEROL HCL 0.63 MG/3ML IN NEBU
0.6300 mg | INHALATION_SOLUTION | Freq: Four times a day (QID) | RESPIRATORY_TRACT | Status: DC
  Administered 2014-05-03 – 2014-05-14 (×42): 0.63 mg via RESPIRATORY_TRACT
  Filled 2014-05-03 (×84): qty 3

## 2014-05-03 MED ORDER — FENTANYL CITRATE 0.05 MG/ML IJ SOLN
INTRAMUSCULAR | Status: AC
  Filled 2014-05-03: qty 5

## 2014-05-03 MED ORDER — MIDAZOLAM HCL 5 MG/5ML IJ SOLN
INTRAMUSCULAR | Status: DC | PRN
  Administered 2014-05-03: 2 mg via INTRAVENOUS

## 2014-05-03 MED ORDER — PROPOFOL 10 MG/ML IV BOLUS
INTRAVENOUS | Status: DC | PRN
  Administered 2014-05-03: 100 mg via INTRAVENOUS

## 2014-05-03 MED ORDER — FENTANYL CITRATE 0.05 MG/ML IJ SOLN
INTRAMUSCULAR | Status: DC | PRN
  Administered 2014-05-03: 150 ug via INTRAVENOUS
  Administered 2014-05-03: 100 ug via INTRAVENOUS

## 2014-05-03 MED ORDER — SODIUM CHLORIDE 0.9 % IV SOLN
0.0000 ug/h | INTRAVENOUS | Status: DC
  Administered 2014-05-03: 50 ug/h via INTRAVENOUS
  Administered 2014-05-04: 200 ug/h via INTRAVENOUS
  Filled 2014-05-03 (×2): qty 50

## 2014-05-03 MED ORDER — LIDOCAINE HCL (CARDIAC) 20 MG/ML IV SOLN
INTRAVENOUS | Status: AC
  Filled 2014-05-03: qty 5

## 2014-05-03 SURGICAL SUPPLY — 81 items
ADH SKN CLS APL DERMABOND .7 (GAUZE/BANDAGES/DRESSINGS) ×2
APL SRG 22X2 LUM MLBL SLNT (VASCULAR PRODUCTS)
APL SRG 7X2 LUM MLBL SLNT (VASCULAR PRODUCTS)
APPLICATOR TIP COSEAL (VASCULAR PRODUCTS) IMPLANT
APPLICATOR TIP EXT COSEAL (VASCULAR PRODUCTS) IMPLANT
BLADE SURG 11 STRL SS (BLADE) ×2 IMPLANT
BRUSH CYTOL CELLEBRITY 1.5X140 (MISCELLANEOUS) IMPLANT
CANISTER SUCTION 2500CC (MISCELLANEOUS) ×3 IMPLANT
CATH KIT ON Q 5IN SLV (PAIN MANAGEMENT) IMPLANT
CATH THORACIC 28FR (CATHETERS) IMPLANT
CATH THORACIC 36FR (CATHETERS) IMPLANT
CATH THORACIC 36FR RT ANG (CATHETERS) IMPLANT
CLEANER TIP ELECTROSURG 2X2 (MISCELLANEOUS) ×4 IMPLANT
CLIP TI MEDIUM 6 (CLIP) IMPLANT
CONN ST 1/4X3/8  BEN (MISCELLANEOUS) ×2
CONN ST 1/4X3/8 BEN (MISCELLANEOUS) IMPLANT
CONN Y 3/8X3/8X3/8  BEN (MISCELLANEOUS) ×1
CONN Y 3/8X3/8X3/8 BEN (MISCELLANEOUS) IMPLANT
CONT SPEC 4OZ CLIKSEAL STRL BL (MISCELLANEOUS) ×6 IMPLANT
COVER SURGICAL LIGHT HANDLE (MISCELLANEOUS) ×3 IMPLANT
COVER TABLE BACK 60X90 (DRAPES) ×3 IMPLANT
DERMABOND ADVANCED (GAUZE/BANDAGES/DRESSINGS) ×1
DERMABOND ADVANCED .7 DNX12 (GAUZE/BANDAGES/DRESSINGS) IMPLANT
DRAIN CHANNEL 32F RND 10.7 FF (WOUND CARE) ×2 IMPLANT
DRAPE LAPAROSCOPIC ABDOMINAL (DRAPES) ×3 IMPLANT
DRAPE WARM FLUID 44X44 (DRAPE) ×3 IMPLANT
DRILL BIT 7/64X5 (BIT) IMPLANT
ELECT BLADE 4.0 EZ CLEAN MEGAD (MISCELLANEOUS) ×3
ELECT REM PT RETURN 9FT ADLT (ELECTROSURGICAL) ×3
ELECTRODE BLDE 4.0 EZ CLN MEGD (MISCELLANEOUS) ×2 IMPLANT
ELECTRODE REM PT RTRN 9FT ADLT (ELECTROSURGICAL) ×2 IMPLANT
FORCEPS BIOP RJ4 1.8 (CUTTING FORCEPS) IMPLANT
GLOVE BIO SURGEON STRL SZ 6.5 (GLOVE) ×6 IMPLANT
GOWN STRL REUS W/ TWL LRG LVL3 (GOWN DISPOSABLE) ×6 IMPLANT
GOWN STRL REUS W/TWL LRG LVL3 (GOWN DISPOSABLE) ×9
KIT BASIN OR (CUSTOM PROCEDURE TRAY) ×3 IMPLANT
KIT ROOM TURNOVER OR (KITS) ×3 IMPLANT
KIT SUCTION CATH 14FR (SUCTIONS) ×4 IMPLANT
MARKER SKIN DUAL TIP RULER LAB (MISCELLANEOUS) ×3 IMPLANT
NDL BIOPSY TRANSBRONCH 21G (NEEDLE) IMPLANT
NEEDLE BIOPSY TRANSBRONCH 21G (NEEDLE) IMPLANT
NS IRRIG 1000ML POUR BTL (IV SOLUTION) ×6 IMPLANT
OIL SILICONE PENTAX (PARTS (SERVICE/REPAIRS)) ×3 IMPLANT
PACK CHEST (CUSTOM PROCEDURE TRAY) ×3 IMPLANT
PAD ARMBOARD 7.5X6 YLW CONV (MISCELLANEOUS) ×6 IMPLANT
PASSER SUT SWANSON 36MM LOOP (INSTRUMENTS) ×1 IMPLANT
SCISSORS LAP 5X35 DISP (ENDOMECHANICALS) IMPLANT
SEALANT PROGEL (MISCELLANEOUS) IMPLANT
SEALANT SURG COSEAL 4ML (VASCULAR PRODUCTS) IMPLANT
SEALANT SURG COSEAL 8ML (VASCULAR PRODUCTS) IMPLANT
SOLUTION ANTI FOG 6CC (MISCELLANEOUS) ×3 IMPLANT
SPONGE GAUZE 4X4 12PLY (GAUZE/BANDAGES/DRESSINGS) ×3 IMPLANT
SPONGE GAUZE 4X4 12PLY STER LF (GAUZE/BANDAGES/DRESSINGS) ×1 IMPLANT
SUT PROLENE 3 0 SH DA (SUTURE) IMPLANT
SUT PROLENE 4 0 RB 1 (SUTURE)
SUT PROLENE 4-0 RB1 .5 CRCL 36 (SUTURE) IMPLANT
SUT SILK  1 MH (SUTURE) ×5
SUT SILK 1 MH (SUTURE) ×4 IMPLANT
SUT SILK 2 0SH CR/8 30 (SUTURE) IMPLANT
SUT SILK 3 0SH CR/8 30 (SUTURE) IMPLANT
SUT VIC AB 1 CTX 18 (SUTURE) IMPLANT
SUT VIC AB 1 CTX 36 (SUTURE) ×3
SUT VIC AB 1 CTX36XBRD ANBCTR (SUTURE) IMPLANT
SUT VIC AB 2-0 CTX 36 (SUTURE) ×1 IMPLANT
SUT VIC AB 3-0 X1 27 (SUTURE) ×1 IMPLANT
SUT VICRYL 2 TP 1 (SUTURE) ×4 IMPLANT
SWAB COLLECTION DEVICE MRSA (MISCELLANEOUS) IMPLANT
SYR 20ML ECCENTRIC (SYRINGE) ×3 IMPLANT
SYSTEM SAHARA CHEST DRAIN ATS (WOUND CARE) ×3 IMPLANT
TAPE CLOTH 4X10 WHT NS (GAUZE/BANDAGES/DRESSINGS) ×3 IMPLANT
TAPE CLOTH SURG 4X10 WHT LF (GAUZE/BANDAGES/DRESSINGS) ×1 IMPLANT
TAPE UMBILICAL COTTON 1/8X30 (MISCELLANEOUS) ×3 IMPLANT
TIP APPLICATOR SPRAY EXTEND 16 (VASCULAR PRODUCTS) IMPLANT
TOWEL OR 17X24 6PK STRL BLUE (TOWEL DISPOSABLE) ×6 IMPLANT
TOWEL OR 17X26 10 PK STRL BLUE (TOWEL DISPOSABLE) ×6 IMPLANT
TRAP SPECIMEN MUCOUS 40CC (MISCELLANEOUS) ×5 IMPLANT
TRAY FOLEY CATH 14FRSI W/METER (CATHETERS) ×3 IMPLANT
TUBE ANAEROBIC SPECIMEN COL (MISCELLANEOUS) IMPLANT
TUBE CONNECTING 12X1/4 (SUCTIONS) ×6 IMPLANT
TUNNELER SHEATH ON-Q 11GX8 DSP (PAIN MANAGEMENT) IMPLANT
WATER STERILE IRR 1000ML POUR (IV SOLUTION) ×6 IMPLANT

## 2014-05-03 NOTE — Anesthesia Preprocedure Evaluation (Signed)
Anesthesia Evaluation  Patient identified by MRN, date of birth, ID band Patient awake    Reviewed: Allergy & Precautions, H&P , NPO status , reviewed documented beta blocker date and time   Airway       Dental  (+) Edentulous Upper, Partial Lower   Pulmonary former smoker,          Cardiovascular hypertension, Rhythm:Irregular     Neuro/Psych    GI/Hepatic   Endo/Other    Renal/GU      Musculoskeletal   Abdominal   Peds  Hematology   Anesthesia Other Findings   Reproductive/Obstetrics                           Anesthesia Physical Anesthesia Plan  ASA: III  Anesthesia Plan: General   Post-op Pain Management:    Induction:   Airway Management Planned:   Additional Equipment: Arterial line  Intra-op Plan:   Post-operative Plan:   Informed Consent:   Plan Discussed with:   Anesthesia Plan Comments:         Anesthesia Quick Evaluation

## 2014-05-03 NOTE — Progress Notes (Signed)
Pt transferred to short stay with 2S RN Ailene Ravel and OR RN. Beside report given to [name], short stay RN. VS stable at time of transfer, belongings with family, consent signed. Pt and family's questions were answered by MD Servando Snare. Pain medicine given prior to transfer. No further questions or complaints at this time. Thelma Comp

## 2014-05-03 NOTE — Anesthesia Postprocedure Evaluation (Signed)
  Anesthesia Post-op Note  Patient: DELBERTA FOLTS  Procedure(s) Performed: Procedure(s): VIDEO BRONCHOSCOPY (N/A) VIDEO ASSISTED THORACOSCOPY (VATS)/EMPYEMA (Right) DECORTICATION (Right)  Patient Location: SICU  Anesthesia Type:General  Level of Consciousness: sedated and Patient remains intubated per anesthesia plan  Airway and Oxygen Therapy: Patient remains intubated per anesthesia plan and Patient placed on Ventilator (see vital sign flow sheet for setting)  Post-op Pain: none  Post-op Assessment: Post-op Vital signs reviewed, Patient's Cardiovascular Status Stable, Respiratory Function Stable, Patent Airway and No signs of Nausea or vomiting  Post-op Vital Signs: stable  Last Vitals:  Filed Vitals:   05/03/14 1300  BP: 110/65  Pulse: 73  Temp:   Resp: 13    Complications: No apparent anesthesia complications

## 2014-05-03 NOTE — Brief Op Note (Addendum)
      [address]       [place] [address]             [phone]       05/03/2014  4:06 PM  PATIENT:  [name]  78 y.o. female  PRE-OPERATIVE DIAGNOSIS:  (R) PLEURAL EFFUSION, empyema   POST-OPERATIVE DIAGNOSIS: same  PROCEDURE:  Procedure(s):  VIDEO BRONCHOSCOPY (N/A)  VIDEO ASSISTED MINI THORACOTOMY -Drainage of Empyema -Debridement of Pleural Peel  SURGEON:  Surgeon(s) and Role:    * [name], MD - Primary  PHYSICIAN ASSISTANT: Erin Barrett PA-C  ANESTHESIA:   general  EBL:  Total I/O In: 313 [I.V.:13; IV Piggyback:300] Out: 240 [Urine:230; Drains:10]  BLOOD ADMINISTERED:none  DRAINS: 38 Blake Drain x 2 Right Chest   LOCAL MEDICATIONS USED:  NONE  SPECIMEN:  Source of Specimen:  Right Pleural Fluid, Right Pleural Peel  DISPOSITION OF SPECIMEN:  Pathology, Microbiology  COUNTS:  YES   DICTATION: .Dragon Dictation  PLAN OF CARE: Admit to inpatient   PATIENT DISPOSITION:  ICU - intubated and hemodynamically stable.   Delay start of Pharmacological VTE agent (>24hrs) due to surgical blood loss or risk of bleeding: yes

## 2014-05-03 NOTE — Progress Notes (Signed)
[place] for heparin Indication: atrial fibrillation  Allergies  Allergen Reactions  . Aspirin Other (See Comments)    REACTION: regular strength causes "heart to beat fast"  . Captopril Hypertension  . Naproxen Other (See Comments)    Tongue swelling  . Penicillins     Swelling around site  . Sulfonamide Derivatives Hives  . Clindamycin/Lincomycin     Patient Measurements: Height: 5' (152.4 cm) Weight: 153 lb 8 oz (69.627 kg) IBW/kg (Calculated) : 45.5 Heparin Dosing Weight: 60kg  Vital Signs: Temp: 97.7 F (36.5 C) (05/19 0625) Temp src: Oral (05/19 0625) BP: 109/52 mmHg (05/19 0619) Pulse Rate: 77 (05/19 0619)  Labs:  Recent Labs  05/01/14 0244 05/02/14 0532 05/03/14 0339  HGB 13.2 12.1 12.9  HCT 38.0 34.9* 38.1  PLT 275 242 269  APTT 43* 37  --   LABPROT  --  14.2  --   INR  --  1.12  --   HEPARINUNFRC 1.10*  --   --   CREATININE 0.81 0.73  --     Estimated Creatinine Clearance: 52 ml/min (by C-G formula based on Cr of 0.73).  Assessment: 78 yo female with PNA/pleural effusion, Xarelto on hold for thoracentesis - last dose 5/15. Heparin discontinued after blood found in chest post thoracentesis 5/17. Pt also had R chest drain placed 5/18. Heparin to restart this morning. CBC remains stable.  Goal of Therapy:  Heparin level 0.3-0.7 units/ml aPTT 66-102 seconds Monitor platelets by anticoagulation protocol: Yes   Plan:  Check baseline heparin level to see if Xarelto still affecting Restart Heparin at 1000 units/hr Depending on baseline heparin level, will f/u heparin level and/or aPTT at 8 hours post gtt start Daily heparin level and CBC  Sherlon Handing, PharmD, BCPS Clinical pharmacist, pager [phone] 05/03/2014,6:54 AM

## 2014-05-03 NOTE — Progress Notes (Signed)
eLink Physician-Brief Progress Note Patient Name: [name] DOB: Sep 11, 1936 MRN: [id number]  Date of Service  05/03/2014   HPI/Events of Note   Pulling at tubes/lines despite sedation  eICU Interventions  Restrain wrists   Intervention Category Minor Interventions: Routine modifications to care plan (e.g. PRN medications for pain, fever)  Juanito Doom 05/03/2014, 8:19 PM

## 2014-05-03 NOTE — Transfer of Care (Signed)
Immediate Anesthesia Transfer of Care Note  Patient: [name]  Procedure(s) Performed: Procedure(s): VIDEO BRONCHOSCOPY (N/A) VIDEO ASSISTED THORACOSCOPY (VATS)/EMPYEMA (Right) DECORTICATION (Right)  Patient Location: PACU and SICU  Anesthesia Type:General  Level of Consciousness: sedated, unresponsive and Patient remains intubated per anesthesia plan  Airway & Oxygen Therapy: Patient remains intubated per anesthesia plan and Patient placed on Ventilator (see vital sign flow sheet for setting)  Post-op Assessment: Post -op Vital signs reviewed and stable  Post vital signs: Reviewed and stable  Complications: No apparent anesthesia complications

## 2014-05-03 NOTE — Progress Notes (Signed)
eLink Physician-Brief Progress Note Patient Name: [name] DOB: 17-Nov-1936 MRN: [id number]  Date of Service  05/03/2014   HPI/Events of Note   Returned from OR on Vent stable  eICU Interventions  Vent orders placed, sedation (fentanyl ordered)   Intervention Category Major Interventions: Respiratory failure - evaluation and management  Juanito Doom 05/03/2014, 4:41 PM

## 2014-05-03 NOTE — Progress Notes (Signed)
Patient seen this am. Discussed situation with patient and family. Enlarging effusion and not draining from tube placed yesterday The goals risks and alternatives of the planned surgical procedure Bronchoscopy, Right VATS and drainage of empyema  have been discussed with the patient in detail. The risks of the procedure including death, infection, stroke, myocardial infarction, bleeding, blood transfusion have all been discussed specifically.  I have quoted [name] a 4 % of perioperative mortality and a complication rate as high as 50 %. The patient's questions have been answered.Bertram Millard [facility] is willing  to proceed with the planned procedure. Patient and Family aware of post op need for mechanical ventilation.  [name] MD      [address] [place] [address] Office [phone]   Beeper [phone]

## 2014-05-03 NOTE — Progress Notes (Signed)
PULMONARY / CRITICAL CARE MEDICINE   Name: [name] MRN: [id number] DOB: [birth year]    ADMISSION DATE:  04/29/2014 CONSULTATION DATE:  5/16   REFERRING MD :  [facility]  PRIMARY SERVICE:  PCCM   CHIEF COMPLAINT:  RLL PNA/Effusion [name]   BRIEF PATIENT DESCRIPTION:  78 yoWF admitted to [facility] 5/15 for acute resp failure for RLL PNA , pleural effusion and atrial fib with rVR .Transfered to [facility] ICU 5/16 d/t worsening resp status , PCCM to admit .  RA office pt w/ RAD (nml PFT)   SIGNIFICANT EVENTS / STUDIES:  5/15 CT chest >neg for PE , RLL consolidation/mod effusion  5/16 transfer from [facility] to [facility] ICU   LINES / TUBES: piv Right pig tail via IR 5/18>>>  CULTURES: 5/15 BC x 2 >> Pleural 5/19>>>  ANTIBIOTICS: 5/15 Vanc  5/15 Primaxin   SUBJECTIVE:   No events overnight, minimal output from chest tube overnight.  VITAL SIGNS: Temp:  [97.8 F (36.6 C)-99.1 F (37.3 C)] 98.3 F (36.8 C) (05/19 0500) Pulse Rate:  [36-128] 79 (05/19 0500) Resp:  [11-24] 11 (05/19 0500) BP: (80-161)/(33-128) 138/56 mmHg (05/19 0500) SpO2:  [90 %-100 %] 97 % (05/19 0500) Weight:  [153 lb 8 oz (69.627 kg)] 153 lb 8 oz (69.627 kg) (05/19 0500) HEMODYNAMICS: cv stable   VENTILATOR SETTINGS:   Now on [place] oxygen sats OK  INTAKE / OUTPUT: Intake/Output     05/18 0701 - 05/19 0700   P.O. 200   I.V. (mL/kg) 205 (2.9)   Other 5   IV Piggyback 700   Total Intake(mL/kg) 1110 (15.9)   Urine (mL/kg/hr) 2125 (1.3)   Chest Tube 26 (0)   Total Output 2151   Net -1041        PHYSICAL EXAMINATION: General:  Anxious, no respiratory distress Neuro:  A/o x 3 follows commands  HEENT:  Intact  Cardiovascular:  RRR , no m/r/g  Lungs: diminished bs in bases  Abdomen:  Soft nt , bs + Musculoskeletal:  Intact . RLE distal incast Skin:  Intact   LABS:  CBC  Recent Labs Lab 05/01/14 0244 05/02/14 0532 05/03/14 0339  WBC 28.9* 25.0* 20.2*  HGB 13.2 12.1 12.9  HCT 38.0 34.9* 38.1  PLT 275  242 269   Coag's  Recent Labs Lab 05/01/14 0244 05/02/14 0532  APTT 43* 37  INR  --  1.12   BMET  Recent Labs Lab 04/30/14 0525 05/01/14 0244 05/02/14 0532  NA 128* 130* 130*  K 3.7 4.7 4.4  CL 87* 87* 90*  CO2 24 25 26   BUN 14 25* 27*  CREATININE 0.56 0.81 0.73  GLUCOSE 201* 163* 97   Electrolytes  Recent Labs Lab 04/30/14 0525 05/01/14 0244 05/02/14 0532  CALCIUM 8.3* 8.8 8.0*   Sepsis Markers  Recent Labs Lab 04/30/14 1707 04/30/14 1709 05/01/14 0244 05/02/14 0532  LATICACIDVEN 1.3  --   --   --   PROCALCITON  --  2.33 2.10 0.85   ABG No results found for this basename: PHART, PCO2ART, PO2ART,  in the last 168 hours Liver Enzymes  Recent Labs Lab 05/01/14 0244  AST 63*  ALT 45*  ALKPHOS 183*  BILITOT 0.6  ALBUMIN 3.0*   Cardiac Enzymes  Recent Labs Lab 04/29/14 2248 04/30/14 1707  PROBNP 1774.0* 2780.0*   Glucose No results found for this basename: GLUCAP,  in the last 168 hours  Imaging Dg Chest [facility] 1 View  05/02/2014  CLINICAL DATA:  Cough and wheezing  EXAM: PORTABLE CHEST - 1 VIEW  COMPARISON:  May 01, 2014  FINDINGS: There is persistent effusion with consolidation involving much of the right middle and lower lobes, stable. Left lung remains clear. Heart is mildly enlarged with normal pulmonary vascularity, stable. No adenopathy. There is arthropathy in both shoulders. No pneumothorax.  IMPRESSION: Right effusion. Consolidation in portions of right middle and lower lobes. Left lung remains clear. No change in cardiac silhouette.   Electronically Signed   By: Lowella Grip M.D.   On: 05/02/2014 07:29   Dg Chest Port 1 View  05/01/2014   CLINICAL DATA:  Right pleural effusion; cough and congestion  EXAM: PORTABLE CHEST - 1 VIEW  COMPARISON:  Study obtained earlier in the day  FINDINGS: There is a persistent right pleural effusion with consolidation in the right base. Left lung is clear. Heart is mildly enlarged with normal  pulmonary vascularity. There is atherosclerotic change in the aorta. No adenopathy. No pneumothorax.  IMPRESSION: Persistent right pleural effusion with right base consolidation. Left lung clear. Stable cardiac prominence.   Electronically Signed   By: Lowella Grip M.D.   On: 05/01/2014 11:16   Ct Image Guided Drainage By Percutaneous Catheter  05/02/2014   CLINICAL DATA:  Pneumonia.  Loculated right pleural effusion.  EXAM: CT GUIDED RIGHT PLEURAL DRAIN CATHETER PLACEMENT  ANESTHESIA/SEDATION: Intravenous Fentanyl and Versed were administered as conscious sedation during continuous cardiorespiratory monitoring by the radiology RN, with a total moderate sedation time of 28 minutes.  PROCEDURE: The procedure, risks, benefits, and alternatives were explained to the patient. Questions regarding the procedure were encouraged and answered. The patient understands and consents to the procedure.  patient placed in left lateral decubitus position. limited axial scans through the thorax obtained. an appropriate skin entry site was determined.  The posterior right chest was prepped with Betadinein a sterile fashion, and a sterile drape was applied covering the operative field. A sterile gown and sterile gloves were used for the procedure. Local anesthesia was provided with 1% Lidocaine.  Under CT fluoroscopic guidance, a 19 gauge percutaneous entry needle was advanced into the right pleural space. Fluid could be aspirated. An Amplatz guidewire advanced easily, its position confirmed on CT fluoroscopy. Tract was dilated to facilitate placement of a 14 French pigtail catheter, placed into the posterior dependent aspect of the loculated effusion. CT confirmed appropriate catheter positioning. Catheter was placed to a Pleur-Evac -20 cmH2O suction and secured externally with 0 Prolene suture and StatLock. The patient tolerated the procedure well.  COMPLICATIONS: No immediate  FINDINGS: Loculated moderate right pleural  effusion is again demonstrated, with consolidation in the right lower lung with air bronchograms. No left effusion. Coronary and aortic calcifications evident.  #55F French pigtail right pleural drain catheter was placed under CT. Only a fraction of the effusion was aspirated into the Pleur-Evac device, suggesting loculation of the fluid. A sample of the aspirate was sent for Gram stain, culture and sensitivity. A few small gas bubbles introduced during catheter placement also demonstrate loculation of the pleural fluid. Marland Kitchen  IMPRESSION: 1. Technically successful CT guided 14 French right pleural drain catheter placement, with partial evacuation of the loculated pleural effusion.   Electronically Signed   By: Arne Cleveland M.D.   On: 05/02/2014 13:54   CXR: 5/17 congestive heart failure with  pulmonary interstitial edema. A moderate size right pleural effusion   ASSESSMENT / PLAN:  PULMONARY A: Acute Respiratory failure, Hypoxic; Multifactorial  secondary to PNA, Asthma exacerbation, Heart Failure. /pleural effusion -loculated   ?empyema   - has laryngeal voice? Some VCD going on as well  P:   - O2 to keep sat >90%   - Cont Nebs ipratropium, Xopenex.  - D/ced solumedrol with severe anxiety and fear of decreased immune status with medrol/poss empyema, this is not a COPD exacerbation. - Continue lasix to 40 mg IV q8 x2 doses, first day with negative fluid balance. - IR placed chest tube 5/18, minimal drainage, will call CVTS back to evaluate patient, anticipate will need surgery.  - Continue Vancomycin and primaxin.  - Check cxr in am.  CARDIOVASCULAR A: Atrial Fib w/ RVR >now in NSR  CHF   P:  - Cont diuresis - F/u echo. - Off xarelto, restart heparin then will decide when to hold pending CVTS evaluation. - Cont metoprolol to prn  - Added cardizem for HR control, will continue for now.  RENAL A:  Hyponatremia  P:   - BMET in AM. - Replace electrolytes as indicated. - Lasix 40 mg IV  q8 x2 doses.  GASTROINTESTINAL A:  GERD  P:   - May start diabetic diet. - PPI.  HEMATOLOGIC A:   P:  - Follow H/H.  INFECTIOUS A:  RLL PNA  P:   - Continue IV abx  - Follow cx date  - Will need culture from fluid once chest tube is in. - Fluid analysis reordered 5/18. - If unable to drain then will need VATS.  ENDOCRINE A:  Hyperglycemia   P:   - SSI.  NEUROLOGIC A:  Anxiety /Agitation / Baseline fibromylagia - on xanax, oxycodone, zoloft, ultram at baseline Currently acute right chest pain due to  P:   - Avoid oversedation , d/c morphine  But use fentanyl for pain prn. - Low dose xanax if needed for anxity. - Hold oxycodone, zoloft and ultram for now. - Monitor closley.  MSK A: RLE in cast from recent surgery P Duplex LE for DVT negative but technically limited study, although CT Angio neg for PE it was a suboptimal study On heparin for a-fib regardless.  TODAY'S SUMMARY: 78 yo former smoker with RLL PNA , pleural effusion with worsening resp distress.  Chest tube per IR today, if ineffective then will need VATS.  Spoke with patient and family extensively, explained the full case and recommended they discuss patient's wishes incase she goes to the OR and we are unable to get off vent which they are currently doing.  Will need to talk to CVTS today regarding ?need for VATS as drainage from CT is minimal.  I have personally obtained a history, examined the patient, evaluated laboratory and imaging results, formulated the assessment and plan and placed orders.  Rush Farmer, M.D. [facility]. Pager: [phone]. After hours pager: [phone].

## 2014-05-03 NOTE — Progress Notes (Signed)
[place] Progress Note Patient Name: [name] DOB: 28-Feb-1936 MRN: [id number]  Date of Service  05/03/2014   HPI/Events of Note   afib Pre-op dilt  eICU Interventions  Cont dilt per tube, liquid   Intervention Category Intermediate Interventions: Arrhythmia - evaluation and management  Juanito Doom 05/03/2014, 6:15 PM

## 2014-05-03 NOTE — Progress Notes (Signed)
Subjective: Rt chest tube drain placed 5/18 Some better per pt Resting   Objective: Vital signs in last 24 hours: Temp:  [97.7 F (36.5 C)-99.1 F (37.3 C)] 97.8 F (36.6 C) (05/19 0830) Pulse Rate:  [36-128] 71 (05/19 0900) Resp:  [11-24] 17 (05/19 0900) BP: (80-161)/(33-128) 110/58 mmHg (05/19 0900) SpO2:  [90 %-100 %] 96 % (05/19 0900) Weight:  [69.627 kg (153 lb 8 oz)] 69.627 kg (153 lb 8 oz) (05/19 0500) Last BM Date: 05/01/14  Intake/Output from previous day: 05/18 0701 - 05/19 0700 In: 1230 [P.O.:200; I.V.:225; IV Piggyback:800] Out: 2151 [Urine:2125; Chest Tube:26] Intake/Output this shift: Total I/O In: 10 [I.V.:10] Out: 10 [Drains:10]  PE:  Afeb; vss Rt chest tube intact No bleeding; NT; no air leak Output 50 cc in pleurvac Serous fluid CXR this am- no real change cxs no growth so far   Lab Results:   Recent Labs  05/02/14 0532 05/03/14 0339  WBC 25.0* 20.2*  HGB 12.1 12.9  HCT 34.9* 38.1  PLT 242 269   BMET  Recent Labs  05/01/14 0244 05/02/14 0532  NA 130* 130*  K 4.7 4.4  CL 87* 90*  CO2 25 26  GLUCOSE 163* 97  BUN 25* 27*  CREATININE 0.81 0.73  CALCIUM 8.8 8.0*   PT/INR  Recent Labs  05/02/14 0532  LABPROT 14.2  INR 1.12   ABG No results found for this basename: PHART, PCO2, PO2, HCO3,  in the last 72 hours  Studies/Results: Dg Chest Port 1 View  05/03/2014   CLINICAL DATA:  Chest tube.  EXAM: PORTABLE CHEST - 1 VIEW  COMPARISON:  DG CHEST 1V PORT dated 05/02/2014  FINDINGS: Right chest tube noted projected over the right mid chest. An extremely tiny right apical pneumothorax cannot be excluded. Mediastinum and hilar structures are normal. Persistent consolidation within the right middle lobe and right lower lobe with persistent right pleural effusion. No significant change from prior exam. Left lung is clear. No acute osseous abnormality .  IMPRESSION: 1. Persistent right lung base infiltrate and right pleural effusion, no  significant change from prior exam. 2. Right chest tube noted over right mid chest. An extremely tiny right apical pneumothorax cannot be excluded.   Electronically Signed   By: Marcello Moores  Register   On: 05/03/2014 07:27   Dg Chest Port 1 View  05/02/2014   CLINICAL DATA:  Cough and wheezing  EXAM: PORTABLE CHEST - 1 VIEW  COMPARISON:  May 01, 2014  FINDINGS: There is persistent effusion with consolidation involving much of the right middle and lower lobes, stable. Left lung remains clear. Heart is mildly enlarged with normal pulmonary vascularity, stable. No adenopathy. There is arthropathy in both shoulders. No pneumothorax.  IMPRESSION: Right effusion. Consolidation in portions of right middle and lower lobes. Left lung remains clear. No change in cardiac silhouette.   Electronically Signed   By: Lowella Grip M.D.   On: 05/02/2014 07:29   Dg Chest Port 1 View  05/01/2014   CLINICAL DATA:  Right pleural effusion; cough and congestion  EXAM: PORTABLE CHEST - 1 VIEW  COMPARISON:  Study obtained earlier in the day  FINDINGS: There is a persistent right pleural effusion with consolidation in the right base. Left lung is clear. Heart is mildly enlarged with normal pulmonary vascularity. There is atherosclerotic change in the aorta. No adenopathy. No pneumothorax.  IMPRESSION: Persistent right pleural effusion with right base consolidation. Left lung clear. Stable cardiac prominence.   Electronically Signed  By: Lowella Grip M.D.   On: 05/01/2014 11:16   Ct Image Guided Drainage By Percutaneous Catheter  05/02/2014   CLINICAL DATA:  Pneumonia.  Loculated right pleural effusion.  EXAM: CT GUIDED RIGHT PLEURAL DRAIN CATHETER PLACEMENT  ANESTHESIA/SEDATION: Intravenous Fentanyl and Versed were administered as conscious sedation during continuous cardiorespiratory monitoring by the radiology RN, with a total moderate sedation time of 28 minutes.  PROCEDURE: The procedure, risks, benefits, and alternatives  were explained to the patient. Questions regarding the procedure were encouraged and answered. The patient understands and consents to the procedure.  patient placed in left lateral decubitus position. limited axial scans through the thorax obtained. an appropriate skin entry site was determined.  The posterior right chest was prepped with Betadinein a sterile fashion, and a sterile drape was applied covering the operative field. A sterile gown and sterile gloves were used for the procedure. Local anesthesia was provided with 1% Lidocaine.  Under CT fluoroscopic guidance, a 19 gauge percutaneous entry needle was advanced into the right pleural space. Fluid could be aspirated. An Amplatz guidewire advanced easily, its position confirmed on CT fluoroscopy. Tract was dilated to facilitate placement of a 14 French pigtail catheter, placed into the posterior dependent aspect of the loculated effusion. CT confirmed appropriate catheter positioning. Catheter was placed to a Pleur-Evac -20 cmH2O suction and secured externally with 0 Prolene suture and StatLock. The patient tolerated the procedure well.  COMPLICATIONS: No immediate  FINDINGS: Loculated moderate right pleural effusion is again demonstrated, with consolidation in the right lower lung with air bronchograms. No left effusion. Coronary and aortic calcifications evident.  #2F French pigtail right pleural drain catheter was placed under CT. Only a fraction of the effusion was aspirated into the Pleur-Evac device, suggesting loculation of the fluid. A sample of the aspirate was sent for Gram stain, culture and sensitivity. A few small gas bubbles introduced during catheter placement also demonstrate loculation of the pleural fluid. Marland Kitchen  IMPRESSION: 1. Technically successful CT guided 14 French right pleural drain catheter placement, with partial evacuation of the loculated pleural effusion.   Electronically Signed   By: Arne Cleveland M.D.   On: 05/02/2014 13:54     Anti-infectives: Anti-infectives   Start     Dose/Rate Route Frequency Ordered Stop   05/02/14 1400  vancomycin (VANCOCIN) IVPB 1000 mg/200 mL premix     1,000 mg 200 mL/hr over 60 Minutes Intravenous Every 12 hours 05/02/14 1234     04/30/14 2300  vancomycin (VANCOCIN) IVPB 750 mg/150 ml premix  Status:  Discontinued     750 mg 150 mL/hr over 60 Minutes Intravenous Every 12 hours 04/30/14 0931 05/02/14 1234   04/30/14 1200  piperacillin-tazobactam (ZOSYN) IVPB 3.375 g  Status:  Discontinued     3.375 g 12.5 mL/hr over 240 Minutes Intravenous Every 8 hours 04/30/14 0931 04/30/14 0938   04/30/14 1200  imipenem-cilastatin (PRIMAXIN) 250 mg in sodium chloride 0.9 % 100 mL IVPB     250 mg 200 mL/hr over 30 Minutes Intravenous 4 times per day 04/30/14 1000     04/30/14 1100  vancomycin (VANCOCIN) IVPB 1000 mg/200 mL premix     1,000 mg 200 mL/hr over 60 Minutes Intravenous  Once 04/30/14 0931 04/30/14 1258   04/30/14 1000  levofloxacin (LEVAQUIN) tablet 500 mg  Status:  Discontinued     500 mg Oral Daily 04/30/14 0224 04/30/14 0859      Assessment/Plan: s/p * No surgery found *  Rt chest tube  drain intact Output slow cxr unchanged Will follow   LOS: 4 days    Lavonia Drafts 05/03/2014

## 2014-05-04 ENCOUNTER — Inpatient Hospital Stay (HOSPITAL_COMMUNITY): Payer: PRIVATE HEALTH INSURANCE

## 2014-05-04 LAB — BASIC METABOLIC PANEL
BUN: 21 mg/dL (ref 6–23)
CO2: 25 mEq/L (ref 19–32)
Calcium: 6.5 mg/dL — ABNORMAL LOW (ref 8.4–10.5)
Chloride: 91 mEq/L — ABNORMAL LOW (ref 96–112)
Creatinine, Ser: 0.82 mg/dL (ref 0.50–1.10)
GFR calc Af Amer: 79 mL/min — ABNORMAL LOW (ref >90)
GFR calc non Af Amer: 68 mL/min — ABNORMAL LOW (ref >90)
Glucose, Bld: 185 mg/dL — ABNORMAL HIGH (ref 70–99)
Potassium: 3.6 mEq/L — ABNORMAL LOW (ref 3.7–5.3)
Sodium: 130 mEq/L — ABNORMAL LOW (ref 137–147)

## 2014-05-04 LAB — CBC
HCT: 37.5 % (ref 36.0–46.0)
Hemoglobin: 12.4 g/dL (ref 12.0–15.0)
MCH: 30.8 pg (ref 26.0–34.0)
MCHC: 33.1 g/dL (ref 30.0–36.0)
MCV: 93.1 fL (ref 78.0–100.0)
Platelets: 246 10*3/uL (ref 150–400)
RBC: 4.03 MIL/uL (ref 3.87–5.11)
RDW: 14.3 % (ref 11.5–15.5)
WBC: 26.4 10*3/uL — ABNORMAL HIGH (ref 4.0–10.5)

## 2014-05-04 LAB — POCT I-STAT 3, ART BLOOD GAS (G3+)
Acid-Base Excess: 2 mmol/L (ref 0.0–2.0)
Acid-Base Excess: 4 mmol/L — ABNORMAL HIGH (ref 0.0–2.0)
Bicarbonate: 28.9 mEq/L — ABNORMAL HIGH (ref 20.0–24.0)
Bicarbonate: 29.4 mEq/L — ABNORMAL HIGH (ref 20.0–24.0)
O2 Saturation: 98 %
O2 Saturation: 96 %
Patient temperature: 98
Patient temperature: 98.2
TCO2: 31 mmol/L (ref 0–100)
TCO2: 31 mmol/L (ref 0–100)
pCO2 arterial: 53.2 mmHg — ABNORMAL HIGH (ref 35.0–45.0)
pCO2 arterial: 47.4 mmHg — ABNORMAL HIGH (ref 35.0–45.0)
pH, Arterial: 7.342 — ABNORMAL LOW (ref 7.350–7.450)
pH, Arterial: 7.4 (ref 7.350–7.450)
pO2, Arterial: 119 mmHg — ABNORMAL HIGH (ref 80.0–100.0)
pO2, Arterial: 85 mmHg (ref 80.0–100.0)

## 2014-05-04 LAB — HEPARIN LEVEL (UNFRACTIONATED): Heparin Unfractionated: 0.32 IU/mL (ref 0.30–0.70)

## 2014-05-04 LAB — MAGNESIUM: Magnesium: 1.9 mg/dL (ref 1.5–2.5)

## 2014-05-04 LAB — PHOSPHORUS: Phosphorus: 3.2 mg/dL (ref 2.3–4.6)

## 2014-05-04 MED ORDER — FENTANYL CITRATE 0.05 MG/ML IJ SOLN
25.0000 ug | INTRAMUSCULAR | Status: DC | PRN
  Administered 2014-05-04: 50 ug via INTRAVENOUS
  Administered 2014-05-04 (×2): 25 ug via INTRAVENOUS
  Administered 2014-05-05 – 2014-05-12 (×23): 50 ug via INTRAVENOUS
  Administered 2014-05-12: 25 ug via INTRAVENOUS
  Filled 2014-05-04 (×28): qty 2

## 2014-05-04 MED ORDER — IPRATROPIUM BROMIDE 0.02 % IN SOLN
0.5000 mg | Freq: Four times a day (QID) | RESPIRATORY_TRACT | Status: DC
  Administered 2014-05-04 – 2014-05-14 (×38): 0.5 mg via RESPIRATORY_TRACT
  Filled 2014-05-04 (×40): qty 2.5

## 2014-05-04 MED ORDER — METOPROLOL TARTRATE 1 MG/ML IV SOLN
2.5000 mg | INTRAVENOUS | Status: DC | PRN
  Administered 2014-05-04: 5 mg via INTRAVENOUS

## 2014-05-04 MED ORDER — METOPROLOL TARTRATE 1 MG/ML IV SOLN
INTRAVENOUS | Status: AC
  Administered 2014-05-04: 5 mg via INTRAVENOUS
  Filled 2014-05-04: qty 5

## 2014-05-04 MED ORDER — SODIUM CHLORIDE 0.9 % IV SOLN
500.0000 mg | Freq: Three times a day (TID) | INTRAVENOUS | Status: DC
  Administered 2014-05-04 – 2014-05-09 (×14): 500 mg via INTRAVENOUS
  Filled 2014-05-04 (×17): qty 500

## 2014-05-04 MED ORDER — SODIUM CHLORIDE 0.9 % IJ SOLN
10.0000 mL | Freq: Two times a day (BID) | INTRAMUSCULAR | Status: DC
  Administered 2014-05-04 – 2014-05-07 (×5): 10 mL
  Administered 2014-05-07: 20 mL
  Administered 2014-05-08 (×2): 10 mL
  Administered 2014-05-09: 20 mL
  Administered 2014-05-09: 10 mL

## 2014-05-04 MED ORDER — HEPARIN (PORCINE) IN NACL 100-0.45 UNIT/ML-% IJ SOLN
1000.0000 [IU]/h | INTRAMUSCULAR | Status: DC
  Administered 2014-05-04 – 2014-05-05 (×2): 1000 [IU]/h via INTRAVENOUS
  Filled 2014-05-04 (×4): qty 250

## 2014-05-04 MED ORDER — SODIUM CHLORIDE 0.9 % IJ SOLN: 10.0000 mL | INTRAMUSCULAR | Status: DC | PRN

## 2014-05-04 NOTE — Progress Notes (Addendum)
Fentanyl 200 mL wasted in sink with Lillia Dallas, RN. Witnessed waste in sink

## 2014-05-04 NOTE — Progress Notes (Signed)
Patient ID: [name], female   DOB: November 15, 1936, 78 y.o.   MRN: [id number] TCTS DAILY ICU PROGRESS NOTE                   [address]            [place] [address]          [phone]   1 Day Post-Op Procedure(s) (LRB): VIDEO BRONCHOSCOPY (N/A) VIDEO ASSISTED THORACOSCOPY (VATS)/EMPYEMA (Right) DECORTICATION (Right)  Total Length of Stay:  LOS: 5 days   Subjective: Opens eyes, follows commends on vent and getting iv sedation  Objective: Vital signs in last 24 hours: Temp:  [97.7 F (36.5 C)-98.6 F (37 C)] 98 F (36.7 C) (05/19 2358) Pulse Rate:  [64-124] 99 (05/20 0600) Cardiac Rhythm:  [-] Atrial fibrillation (05/20 0600) Resp:  [9-22] 22 (05/20 0600) BP: (104-158)/(50-84) 142/66 mmHg (05/20 0600) SpO2:  [93 %-100 %] 98 % (05/20 0722) Arterial Line BP: (93-167)/(56-94) 114/59 mmHg (05/20 0600) FiO2 (%):  [40 %-50 %] 40 % (05/20 0722) Weight:  [154 lb 8.7 oz (70.1 kg)] 154 lb 8.7 oz (70.1 kg) (05/20 0342)  Filed Weights   05/02/14 0500 05/03/14 0500 05/04/14 0342  Weight: 158 lb 8.2 oz (71.9 kg) 153 lb 8 oz (69.627 kg) 154 lb 8.7 oz (70.1 kg)    Weight change: 1 lb 0.7 oz (0.473 kg)   Hemodynamic parameters for last 24 hours:    Intake/Output from previous day: 05/19 0701 - 05/20 0700 In: 3409.8 [I.V.:2456.8; NG/GT:153; IV Piggyback:800] Out: 920 [Urine:730; Drains:10; Chest Tube:180]  Intake/Output this shift: Total I/O In: -  Out: 15 [Urine:15]  Current Meds: Scheduled Meds: . acetaminophen  1,000 mg Oral 4 times per day   Or  . acetaminophen (TYLENOL) oral liquid 160 mg/5 mL  1,000 mg Oral 4 times per day  . antiseptic oral rinse  15 mL Mouth Rinse QID  . atorvastatin  10 mg Oral q1800  . bisacodyl  10 mg Oral Daily  . chlorhexidine  15 mL Mouth Rinse BID  . Chlorhexidine Gluconate Cloth  6 each Topical Q0600  . diltiazem  30 mg Oral 4 times per day  . fluticasone  2 spray Each Nare Daily  . imipenem-cilastatin  250 mg  Intravenous 4 times per day  . ipratropium  0.5 mg Nebulization TID  . levalbuterol  0.63 mg Nebulization Q6H  . levothyroxine  50 mcg Oral QAC breakfast  . metoCLOPramide (REGLAN) injection  10 mg Intravenous 4 times per day  . metoprolol tartrate  25 mg Oral BID  . montelukast  10 mg Oral QHS  . mupirocin ointment  1 application Nasal BID  . pantoprazole (PROTONIX) IV  40 mg Intravenous Daily  . senna-docusate  1 tablet Oral QHS  . sertraline  100 mg Oral QHS  . sodium chloride  3 mL Intravenous Q12H  . vancomycin  1,000 mg Intravenous Q12H   Continuous Infusions: . sodium chloride 10 mL/hr at 05/02/14 1856  . dextrose 5 % and 0.45% NaCl 100 mL/hr at 05/04/14 0600  . fentaNYL infusion INTRAVENOUS 200 mcg/hr (05/04/14 0602)   PRN Meds:.ALPRAZolam, ALPRAZolam, fentaNYL, HYDROmorphone (DILAUDID) injection, levalbuterol, ondansetron (ZOFRAN) IV, potassium chloride  General appearance: cooperative, appears older than stated age and mild distress Neurologic: intact Heart: regular rate and rhythm, S1, S2 normal, no murmur, click, rub or gallop Lungs: diminished breath sounds RLL and RML Abdomen: soft, non-tender; bowel sounds normal; no masses,  no organomegaly  Extremities: extremities normal, atraumatic, no cyanosis or edema and Homans sign is negative, no sign of DVT Wound: intact, no air leak  Lab Results: CBC: Recent Labs  05/03/14 0339 05/04/14 0420  WBC 20.2* 26.4*  HGB 12.9 12.4  HCT 38.1 37.5  PLT 269 246   BMET:  Recent Labs  05/02/14 0532 05/04/14 0420  NA 130* 130*  K 4.4 3.6*  CL 90* 91*  CO2 26 25  GLUCOSE 97 185*  BUN 27* 21  CREATININE 0.73 0.82  CALCIUM 8.0* 6.5*    PT/INR:  Recent Labs  05/02/14 0532  LABPROT 14.2  INR 1.12   Radiology: Portable Chest Xray  05/03/2014   CLINICAL DATA:  Endotracheal tube placement  EXAM: PORTABLE CHEST - 1 VIEW  COMPARISON:  Prior radiograph from earlier the same day  FINDINGS: There has been interval placement  of an endotracheal tube with tip located 1.9 cm above the carina. Right subclavian central venous catheter has also been placed with tip overlying the cavoatrial junction.  Large pigtail right-sided chest tube has been removed with placement of 2 new chest tubes into the right hemi thorax. The tip of 1 chest tube overlies the lateral right upper lobe. The second tip overlies the right lung apex. A moderate right pleural effusion is similar as compared to prior study. There is patchy opacity within the mid and lower right lung which may reflect atelectasis, effusion, or infiltrate. The left lung is grossly clear.  No definite pneumothorax now seen.  IMPRESSION: 1. Tip of the endotracheal tube 1.9 cm above the carina. 2. Interval placement of right subclavian central venous catheter with tip overlying the cavoatrial junction. 3. Interval removal of pigtail right chest tube with placement of 2 new right-sided chest tubes as above. No pneumothorax now identified. 4. Persistent moderate to large right pleural effusion. 5. Patchy right mid and lower lung opacity, which may reflect atelectasis, effusion, or infiltrate.   Electronically Signed   By: Jeannine Boga M.D.   On: 05/03/2014 17:35     Assessment/Plan: S/P Procedure(s) (LRB): VIDEO BRONCHOSCOPY (N/A) VIDEO ASSISTED THORACOSCOPY (VATS)/EMPYEMA (Right) DECORTICATION (Right) leave chest tubes now cultures pending Primary critical care per ccm Low urine output, cr stable currently   [name] 05/04/2014 7:35 AM

## 2014-05-04 NOTE — Progress Notes (Signed)
[place] for Heparin Indication: atrial fibrillation  Allergies  Allergen Reactions  . Aspirin Other (See Comments)    REACTION: regular strength causes "heart to beat fast"  . Captopril Hypertension  . Naproxen Other (See Comments)    Tongue swelling  . Penicillins     Swelling around site  . Sulfonamide Derivatives Hives  . Clindamycin/Lincomycin     Labs:  Recent Labs  05/02/14 0532 05/03/14 0339 05/03/14 0900 05/04/14 0420  HGB 12.1 12.9  --  12.4  HCT 34.9* 38.1  --  37.5  PLT 242 269  --  246  APTT 37  --   --   --   LABPROT 14.2  --   --   --   INR 1.12  --   --   --   HEPARINUNFRC  --   --  0.32  --   CREATININE 0.73  --   --  0.82    Estimated Creatinine Clearance: 49.8 ml/min (by C-G formula based on Cr of 0.82).  Assessment: 78 yo female with PNA/pleural effusion, on Xarelto PTA- last dose 5/15. Heparin discontinued after blood found in chest post thoracentesis 5/17. Pt also had R chest drain placed 5/18.  Now s/p VATS  To restart heparin  Goal of Therapy:  Heparin level 0.3-0.7 units/ml aPTT 66-102 seconds Monitor platelets by anticoagulation protocol: Yes   Plan:  Restart Heparin at 1000 units/hr Heparin level 8 hours after heparin starts Daily heparin level, CBC  Thank you. Anette Guarneri, PharmD [phone]  05/04/2014,10:13 AM

## 2014-05-04 NOTE — Progress Notes (Signed)
Events noted. IR placed drain was not sufficient for drainage. Pt taken to OR yesterday, IR drain removed and replaced with large bore drains X 2 by CTS. IR will sign off.  Ascencion Dike PA-C Interventional Radiology 05/04/2014 8:54 AM

## 2014-05-04 NOTE — Procedures (Signed)
Extubation Procedure Note  Patient Details:   Name: [name] DOB: 1936-05-25 MRN: [id number]   Airway Documentation:     Evaluation  O2 sats: stable throughout Complications: No apparent complications Patient did tolerate procedure well. Bilateral Breath Sounds: Rhonchi Suctioning: Airway Yes Extubated to 4l/min [place] Vocalizes well IS instructed 564ml achieved.  Jones Skene [facility] 05/04/2014, 10:43 AM

## 2014-05-04 NOTE — Addendum Note (Signed)
Addendum created 05/04/14 0383 by Eligha Bridegroom, CRNA   Modules edited: Anesthesia Blocks and Procedures, Anesthesia LDA, Clinical Notes   Clinical Notes:  File: [id number]; File: [id number]

## 2014-05-04 NOTE — Progress Notes (Signed)
ANTICOAGULATION CONSULT NOTE - Follow Up Consult  Pharmacy Consult for Heparin Indication: atrial fibrillation  Allergies  Allergen Reactions  . Aspirin Other (See Comments)    REACTION: regular strength causes "heart to beat fast"  . Captopril Hypertension  . Naproxen Other (See Comments)    Tongue swelling  . Penicillins     Swelling around site  . Sulfonamide Derivatives Hives  . Clindamycin/Lincomycin     Patient Measurements: Height: 4\' 11"  (149.9 cm) Weight: 154 lb 8.7 oz (70.1 kg) IBW/kg (Calculated) : 43.2 Heparin Dosing Weight:    Vital Signs: Temp: 98.2 F (36.8 C) (05/20 1100) Temp src: Oral (05/20 1100) BP: 108/61 mmHg (05/20 1900) Pulse Rate: 87 (05/20 1900)  Labs:  Recent Labs  05/02/14 0532 05/03/14 0339 05/03/14 0900 05/04/14 0420 05/04/14 1800  HGB 12.1 12.9  --  12.4  --   HCT 34.9* 38.1  --  37.5  --   PLT 242 269  --  246  --   APTT 37  --   --   --   --   LABPROT 14.2  --   --   --   --   INR 1.12  --   --   --   --   HEPARINUNFRC  --   --  0.32  --  0.32  CREATININE 0.73  --   --  0.82  --     Estimated Creatinine Clearance: 49.8 ml/min (by C-G formula based on Cr of 0.82).  Assessment: Heparin for Afib (Xarelto PTA). Heparin level 0.32 remains in goal range.  Goal of Therapy:  Heparin level 0.3-0.7 units/ml Monitor platelets by anticoagulation protocol: Yes   Plan:  Continue heparin at 1000 units/hr Daily heparin level and CBC  Sueann Brownley S. Alford Highland, PharmD, [facility] Clinical Staff Pharmacist Pager [phone]  Wayland Salinas 05/04/2014,7:16 PM

## 2014-05-04 NOTE — Anesthesia Procedure Notes (Addendum)
Procedure Name: Intubation Date/Time: 05/03/2014 4:05 PM Performed by: Eligha Bridegroom Pre-anesthesia Checklist: Patient identified, Patient being monitored, Emergency Drugs available, Timeout performed and Suction available Oxygen Delivery Method: Circle system utilized Preoxygenation: Pre-oxygenation with 100% oxygen Laryngoscope Size: Mac and 3 Grade View: Grade II Tube type: Subglottic suction tube Number of attempts: 1 Airway Equipment and Method: Stylet Placement Confirmation: ETT inserted through vocal cords under direct vision,  breath sounds checked- equal and bilateral and positive ETCO2 Secured at: 21 cm Tube secured with: Tape Dental Injury: Teeth and Oropharynx as per pre-operative assessment    Procedure Name: Intubation Date/Time: 05/03/2014 2:56 PM Performed by: Eligha Bridegroom Pre-anesthesia Checklist: Emergency Drugs available, Timeout performed, Patient identified, Patient being monitored and Suction available Patient Re-evaluated:Patient Re-evaluated prior to inductionOxygen Delivery Method: Circle system utilized Preoxygenation: Pre-oxygenation with 100% oxygen Laryngoscope Size: Mac and 3 Grade View: Grade II Endobronchial tube: Right and 35 Fr Number of attempts: 1 Airway Equipment and Method: Stylet Placement Confirmation: ETT inserted through vocal cords under direct vision,  breath sounds checked- equal and bilateral and positive ETCO2 Tube secured with: Tape Dental Injury: Teeth and Oropharynx as per pre-operative assessment

## 2014-05-04 NOTE — Progress Notes (Signed)
PULMONARY / CRITICAL CARE MEDICINE   Name: [name] MRN: [id number] DOB: Mar 15, 1936    ADMISSION DATE:  04/29/2014 CONSULTATION DATE:  5/16   REFERRING MD :  [facility]  PRIMARY SERVICE:  PCCM   CHIEF COMPLAINT:  RLL PNA/Effusion [name]   BRIEF PATIENT DESCRIPTION:  78 yoWF NHR remote ex smoker admitted to [facility] 5/15 for acute resp failure for RLL PNA , pleural effusion and atrial fib with rVR .Transfered to [facility] ICU 5/16 d/t worsening resp status , PCCM to admit .  RA office pt w/ RAD (nml spiro 2014 )  Recent adm to rehab after ankle surgery 4/16-4/20 [facility]  SIGNIFICANT EVENTS / STUDIES:  5/15 CT chest >neg for PE , RLL consolidation/mod effusion  5/16 transfer from [facility] to [facility] ICU  5/19 VATS drainange of rt empyema  LINES / TUBES: piv Right pig tail via IR 5/18>>>  CULTURES: 5/15 BC x 2 >>ng Pleural 5/19>>>ng RVP >> para influenza  ANTIBIOTICS: 5/15 Vanc  5/15 Primaxin   SUBJECTIVE:   Afebrile Awake on low dose fent gtt  VITAL SIGNS: Temp:  [97.7 F (36.5 C)-98.6 F (37 C)] 98.5 F (36.9 C) (05/20 0800) Pulse Rate:  [35-124] 51 (05/20 0900) Resp:  [9-22] 18 (05/20 0900) BP: (106-158)/(50-95) 115/95 mmHg (05/20 0900) SpO2:  [93 %-100 %] 98 % (05/20 0900) Arterial Line BP: (93-167)/(56-94) 142/63 mmHg (05/20 0900) FiO2 (%):  [40 %-50 %] 40 % (05/20 0800) Weight:  [70.1 kg (154 lb 8.7 oz)] 70.1 kg (154 lb 8.7 oz) (05/20 0342) HEMODYNAMICS: cv stable   VENTILATOR SETTINGS: Vent Mode:  [-] PSV;CPAP FiO2 (%):  [40 %-50 %] 40 % Set Rate:  [14 bmp] 14 bmp Vt Set:  [350 mL] 350 mL PEEP:  [5 cmH20] 5 cmH20 Pressure Support:  [10 cmH20] 10 cmH20 Plateau Pressure:  [16 cmH20-17 cmH20] 17 cmH20 Now on [place] oxygen sats OK  INTAKE / OUTPUT: Intake/Output     05/19 0701 - 05/20 0700 05/20 0701 - 05/21 0700   P.O.     I.V. (mL/kg) 2556.8 (36.5) 275 (3.9)   Other     NG/GT 153    IV Piggyback 800    Total Intake(mL/kg) 3509.8 (50.1) 275 (3.9)   Urine  (mL/kg/hr) 730 (0.4) 40 (0.2)   Drains 10 (0)    Chest Tube 180 (0.1)    Total Output 920 40   Net +2589.8 +235         PHYSICAL EXAMINATION: General:  Anxious, no respiratory distress Neuro:  A/o x 3 follows commands  HEENT:  Intact  Cardiovascular:  RRR , no m/r/g  Lungs: diminished bs in bases , rt chest tube Abdomen:  Soft nt , bs + Musculoskeletal:  Intact . RLE distal incast Skin:  Intact   LABS:  CBC  Recent Labs Lab 05/02/14 0532 05/03/14 0339 05/04/14 0420  WBC 25.0* 20.2* 26.4*  HGB 12.1 12.9 12.4  HCT 34.9* 38.1 37.5  PLT 242 269 246   Coag's  Recent Labs Lab 05/01/14 0244 05/02/14 0532  APTT 43* 37  INR  --  1.12   BMET  Recent Labs Lab 05/01/14 0244 05/02/14 0532 05/04/14 0420  NA 130* 130* 130*  K 4.7 4.4 3.6*  CL 87* 90* 91*  CO2 25 26 25   BUN 25* 27* 21  CREATININE 0.81 0.73 0.82  GLUCOSE 163* 97 185*   Electrolytes  Recent Labs Lab 05/01/14 0244 05/02/14 0532 05/04/14 0420  CALCIUM 8.8 8.0*  6.5*  MG  --   --  1.9  PHOS  --   --  3.2   Sepsis Markers  Recent Labs Lab 04/30/14 1707 04/30/14 1709 05/01/14 0244 05/02/14 0532  LATICACIDVEN 1.3  --   --   --   PROCALCITON  --  2.33 2.10 0.85   ABG  Recent Labs Lab 05/03/14 1658 05/04/14 0418  PHART 7.362 7.342*  PCO2ART 55.1* 53.2*  PO2ART 77.0* 119.0*   Liver Enzymes  Recent Labs Lab 05/01/14 0244  AST 63*  ALT 45*  ALKPHOS 183*  BILITOT 0.6  ALBUMIN 3.0*   Cardiac Enzymes  Recent Labs Lab 04/29/14 2248 04/30/14 1707  PROBNP 1774.0* 2780.0*   Glucose  Recent Labs Lab 05/03/14 1955  GLUCAP 165*    Imaging Dg Chest Port 1 View  05/04/2014   CLINICAL DATA:  Status post VATS  EXAM: PORTABLE CHEST - 1 VIEW  COMPARISON:  05/03/2014  FINDINGS: An endotracheal tube is noted 2 cm above the carina and stable in position. A nasogastric catheter seen within the stomach which is new from the prior exam. A right-sided subclavian line is noted within the  right atrium. This is somewhat accentuated by the poor inspiratory effort. Multiple right-sided chest tubes are again identified and stable. No pneumothorax is seen. The left lung is clear. Persistent increased density is noted in the right lung stable from the previous exam. No new focal abnormality is seen.  IMPRESSION: New nasogastric catheter in satisfactory position.  Postsurgical changes on the right stable in appearance.  No new focal abnormality is seen.   Electronically Signed   By: Inez Catalina M.D.   On: 05/04/2014 09:08   Portable Chest Xray  05/03/2014   CLINICAL DATA:  Endotracheal tube placement  EXAM: PORTABLE CHEST - 1 VIEW  COMPARISON:  Prior radiograph from earlier the same day  FINDINGS: There has been interval placement of an endotracheal tube with tip located 1.9 cm above the carina. Right subclavian central venous catheter has also been placed with tip overlying the cavoatrial junction.  Large pigtail right-sided chest tube has been removed with placement of 2 new chest tubes into the right hemi thorax. The tip of 1 chest tube overlies the lateral right upper lobe. The second tip overlies the right lung apex. A moderate right pleural effusion is similar as compared to prior study. There is patchy opacity within the mid and lower right lung which may reflect atelectasis, effusion, or infiltrate. The left lung is grossly clear.  No definite pneumothorax now seen.  IMPRESSION: 1. Tip of the endotracheal tube 1.9 cm above the carina. 2. Interval placement of right subclavian central venous catheter with tip overlying the cavoatrial junction. 3. Interval removal of pigtail right chest tube with placement of 2 new right-sided chest tubes as above. No pneumothorax now identified. 4. Persistent moderate to large right pleural effusion. 5. Patchy right mid and lower lung opacity, which may reflect atelectasis, effusion, or infiltrate.   Electronically Signed   By: Jeannine Boga M.D.   On:  05/03/2014 17:35   Dg Chest Port 1 View  05/03/2014   CLINICAL DATA:  Chest tube.  EXAM: PORTABLE CHEST - 1 VIEW  COMPARISON:  DG CHEST 1V PORT dated 05/02/2014  FINDINGS: Right chest tube noted projected over the right mid chest. An extremely tiny right apical pneumothorax cannot be excluded. Mediastinum and hilar structures are normal. Persistent consolidation within the right middle lobe and right lower lobe with persistent  right pleural effusion. No significant change from prior exam. Left lung is clear. No acute osseous abnormality .  IMPRESSION: 1. Persistent right lung base infiltrate and right pleural effusion, no significant change from prior exam. 2. Right chest tube noted over right mid chest. An extremely tiny right apical pneumothorax cannot be excluded.   Electronically Signed   By: Marcello Moores  Register   On: 05/03/2014 07:27   Ct Image Guided Drainage By Percutaneous Catheter  05/02/2014   CLINICAL DATA:  Pneumonia.  Loculated right pleural effusion.  EXAM: CT GUIDED RIGHT PLEURAL DRAIN CATHETER PLACEMENT  ANESTHESIA/SEDATION: Intravenous Fentanyl and Versed were administered as conscious sedation during continuous cardiorespiratory monitoring by the radiology RN, with a total moderate sedation time of 28 minutes.  PROCEDURE: The procedure, risks, benefits, and alternatives were explained to the patient. Questions regarding the procedure were encouraged and answered. The patient understands and consents to the procedure.  patient placed in left lateral decubitus position. limited axial scans through the thorax obtained. an appropriate skin entry site was determined.  The posterior right chest was prepped with Betadinein a sterile fashion, and a sterile drape was applied covering the operative field. A sterile gown and sterile gloves were used for the procedure. Local anesthesia was provided with 1% Lidocaine.  Under CT fluoroscopic guidance, a 19 gauge percutaneous entry needle was advanced into the  right pleural space. Fluid could be aspirated. An Amplatz guidewire advanced easily, its position confirmed on CT fluoroscopy. Tract was dilated to facilitate placement of a 14 French pigtail catheter, placed into the posterior dependent aspect of the loculated effusion. CT confirmed appropriate catheter positioning. Catheter was placed to a Pleur-Evac -20 cmH2O suction and secured externally with 0 Prolene suture and StatLock. The patient tolerated the procedure well.  COMPLICATIONS: No immediate  FINDINGS: Loculated moderate right pleural effusion is again demonstrated, with consolidation in the right lower lung with air bronchograms. No left effusion. Coronary and aortic calcifications evident.  #60F French pigtail right pleural drain catheter was placed under CT. Only a fraction of the effusion was aspirated into the Pleur-Evac device, suggesting loculation of the fluid. A sample of the aspirate was sent for Gram stain, culture and sensitivity. A few small gas bubbles introduced during catheter placement also demonstrate loculation of the pleural fluid. Marland Kitchen  IMPRESSION: 1. Technically successful CT guided 14 French right pleural drain catheter placement, with partial evacuation of the loculated pleural effusion.   Electronically Signed   By: Arne Cleveland M.D.   On: 05/02/2014 13:54   CXR: 5/17 congestive heart failure with  pulmonary interstitial edema. A moderate size right pleural effusion   ASSESSMENT / PLAN:  PULMONARY A: Acute Respiratory failure, Hypoxic; Multifactorial secondary to PNA, Asthma exacerbation, Heart Failure. /pleural effusion -empyema s/p VATS  - has laryngeal voice? Some VCD going on as well Asthma/reactive airways  P:   - O2 to keep sat >90%   - Cont Nebs ipratropium, Xopenex.  -SBTs with goal extubation  - Continue Vancomycin and primaxin.    CARDIOVASCULAR A: Atrial Fib w/ RVR >now in NSR  CHF   P:  - Cont diuresis - F/u echo. - Off xarelto, restart heparin if  OK with surgery - Cont metoprolol to prn  -ct cardizem PO  RENAL A:  Hyponatremia  hypokalemia P:   - BMET in AM. - Replace electrolytes as indicated. - Lasix on hold  GASTROINTESTINAL A:  GERD  P:   - PPI.  HEMATOLOGIC A:   P:  -  Follow H/H.  INFECTIOUS A:  RLL PNA  P:   - Continue IV abx  - Follow VATS cx    ENDOCRINE A:  Hyperglycemia   P:   - SSI.  NEUROLOGIC A:  Anxiety /Agitation / Baseline fibromylagia - on xanax, oxycodone, zoloft, ultram at baseline Currently acute right chest pain due to  P:   - Avoid oversedation , use fentanyl for pain prn. - Low dose xanax if needed for anxity. - Hold oxycodone, zoloft and ultram for now. - Monitor closley.  MSK A: RLE in cast from recent surgery P Duplex LE for DVT negative but technically limited study, although CT Angio neg for PE it was a suboptimal study On heparin for a-fib regardless.  TODAY'S SUMMARY: 78 yo former smoker with RLL PNA , pleural effusion with worsening resp distress.  Hope to extubate, restart heparin if OK with TCTS   I have personally obtained a history, examined the patient, evaluated laboratory and imaging results, formulated the assessment and plan and placed orders.   CC time x 69m  Kara Mead MD. [place] Flood. [facility] Pager [phone] If no response call [phone]

## 2014-05-04 NOTE — Progress Notes (Signed)
POD  # 1 decortication  BP 103/83  Pulse 89  Temp(Src) 98.2 F (36.8 C) (Oral)  Resp 17  Ht 4\' 11"  (1.499 m)  Wt 154 lb 8.7 oz (70.1 kg)  BMI 31.20 kg/m2  SpO2 79%   Intake/Output Summary (Last 24 hours) at 05/04/14 1749 Last data filed at 05/04/14 1400  Gross per 24 hour  Intake 2956.75 ml  Output    720 ml  Net 2236.75 ml    CT 50 ml so far today  Pain well controlled

## 2014-05-05 ENCOUNTER — Inpatient Hospital Stay (HOSPITAL_COMMUNITY): Payer: PRIVATE HEALTH INSURANCE

## 2014-05-05 LAB — CBC
HCT: 29.7 % — ABNORMAL LOW (ref 36.0–46.0)
Hemoglobin: 9.9 g/dL — ABNORMAL LOW (ref 12.0–15.0)
MCH: 30.6 pg (ref 26.0–34.0)
MCHC: 33.3 g/dL (ref 30.0–36.0)
MCV: 91.7 fL (ref 78.0–100.0)
Platelets: 246 10*3/uL (ref 150–400)
RBC: 3.24 MIL/uL — ABNORMAL LOW (ref 3.87–5.11)
RDW: 14.4 % (ref 11.5–15.5)
WBC: 22.5 10*3/uL — ABNORMAL HIGH (ref 4.0–10.5)

## 2014-05-05 LAB — COMPREHENSIVE METABOLIC PANEL
ALT: 19 U/L (ref 0–35)
AST: 29 U/L (ref 0–37)
Albumin: 1.6 g/dL — ABNORMAL LOW (ref 3.5–5.2)
Alkaline Phosphatase: 103 U/L (ref 39–117)
BUN: 14 mg/dL (ref 6–23)
CO2: 26 mEq/L (ref 19–32)
Calcium: 5.9 mg/dL — CL (ref 8.4–10.5)
Chloride: 94 mEq/L — ABNORMAL LOW (ref 96–112)
Creatinine, Ser: 0.66 mg/dL (ref 0.50–1.10)
GFR calc Af Amer: >90 mL/min (ref >90)
GFR calc non Af Amer: 84 mL/min — ABNORMAL LOW (ref >90)
Glucose, Bld: 151 mg/dL — ABNORMAL HIGH (ref 70–99)
Potassium: 2.8 mEq/L — CL (ref 3.7–5.3)
Sodium: 131 mEq/L — ABNORMAL LOW (ref 137–147)
Total Bilirubin: 0.6 mg/dL (ref 0.3–1.2)
Total Protein: 5.3 g/dL — ABNORMAL LOW (ref 6.0–8.3)

## 2014-05-05 LAB — CULTURE, BLOOD (ROUTINE X 2)
Culture: NO GROWTH
Culture: NO GROWTH

## 2014-05-05 LAB — HEPARIN LEVEL (UNFRACTIONATED): Heparin Unfractionated: 0.3 IU/mL (ref 0.30–0.70)

## 2014-05-05 LAB — VANCOMYCIN, TROUGH: Vancomycin Tr: 18.6 ug/mL (ref 10.0–20.0)

## 2014-05-05 MED ORDER — POTASSIUM CHLORIDE 10 MEQ/50ML IV SOLN
10.0000 meq | INTRAVENOUS | Status: AC
  Administered 2014-05-05: 10 meq via INTRAVENOUS

## 2014-05-05 MED ORDER — POTASSIUM CHLORIDE 10 MEQ/50ML IV SOLN
10.0000 meq | INTRAVENOUS | Status: DC
  Administered 2014-05-05 (×5): 10 meq via INTRAVENOUS
  Filled 2014-05-05 (×8): qty 50

## 2014-05-05 MED ORDER — DILTIAZEM HCL 30 MG PO TABS
30.0000 mg | ORAL_TABLET | Freq: Four times a day (QID) | ORAL | Status: DC
  Administered 2014-05-05 – 2014-05-14 (×34): 30 mg via ORAL
  Filled 2014-05-05 (×40): qty 1

## 2014-05-05 MED ORDER — SODIUM CHLORIDE 0.9 % IV SOLN
1.0000 g | Freq: Once | INTRAVENOUS | Status: AC
  Administered 2014-05-05: 1 g via INTRAVENOUS
  Filled 2014-05-05: qty 10

## 2014-05-05 NOTE — OR Nursing (Signed)
LATE ENTRY: ordered respiratory cultures for Right LL washings and Right pleural fluide

## 2014-05-05 NOTE — Progress Notes (Signed)
PULMONARY / CRITICAL CARE MEDICINE   Name: [name] MRN: [id number] DOB: 01/05/1936    ADMISSION DATE:  04/29/2014 CONSULTATION DATE:  5/16   REFERRING MD :  [facility]  PRIMARY SERVICE:  PCCM   CHIEF COMPLAINT:  RLL PNA/Effusion [name]   BRIEF PATIENT DESCRIPTION:  78 yoWF NHR remote ex smoker [facility]15 for acute resp failure for RLL PNA , pleural effusion and atrial fib with rVR .Transfered to [facility] ICU 5/16 d/t worsening resp status , PCCM to admit .  RA office pt w/ RAD (nml spiro 2014 )  Recent adm to rehab after ankle surgery 4/16-4/20 [facility]  SIGNIFICANT EVENTS / STUDIES:  5/15 CT chest >neg for PE , RLL consolidation/mod effusion  5/16 [facility] ICU  5/19 VATS drainange of rt empyema  LINES / TUBES: piv Right pig tail via IR 5/18>>>  CULTURES: 5/15 BC x 2 >>ng Pleural 5/19>>>ng RVP >> para influenza  ANTIBIOTICS: 5/15 Vanc  5/15 Primaxin   SUBJECTIVE:   Afebrile Voice hoarse C/o dyspnea  VITAL SIGNS: Temp:  [98.3 F (36.8 C)-98.4 F (36.9 C)] 98.4 F (36.9 C) (05/21 0400) Pulse Rate:  [42-113] 77 (05/21 1041) Resp:  [9-23] 14 (05/21 1000) BP: (97-125)/(38-83) 114/48 mmHg (05/21 1000) SpO2:  [91 %-100 %] 100 % (05/21 1041) Arterial Line BP: (91-147)/(39-63) 122/47 mmHg (05/21 1000) Weight:  [71.7 kg (158 lb 1.1 oz)] 71.7 kg (158 lb 1.1 oz) (05/21 0500) HEMODYNAMICS: cv stable   VENTILATOR SETTINGS:   Now on [place] oxygen sats OK  INTAKE / OUTPUT: Intake/Output     05/20 0701 - 05/21 0700 05/21 0701 - 05/22 0700   I.V. (mL/kg) 2265 (31.6) 330 (4.6)   NG/GT     IV Piggyback 750 150   Total Intake(mL/kg) 3015 (42.1) 480 (6.7)   Urine (mL/kg/hr) 770 (0.4) 350 (1.2)   Drains     Chest Tube 90 (0.1)    Total Output 860 350   Net +2155 +130         PHYSICAL EXAMINATION: General:  Anxious, no respiratory distress Neuro:  A/o x 3 follows commands  HEENT:  Intact  Cardiovascular:  RRR , no m/r/g  Lungs: diminished bs  in bases , rt chest tube Abdomen:  Soft nt , bs + Musculoskeletal:  Intact . RLE distal incast Skin:  Intact   LABS:  CBC  Recent Labs Lab 05/03/14 0339 05/04/14 0420 05/05/14 0425  WBC 20.2* 26.4* 22.5*  HGB 12.9 12.4 9.9*  HCT 38.1 37.5 29.7*  PLT 269 246 246   Coag's  Recent Labs Lab 05/01/14 0244 05/02/14 0532  APTT 43* 37  INR  --  1.12   BMET  Recent Labs Lab 05/02/14 0532 05/04/14 0420 05/05/14 0425  NA 130* 130* 131*  K 4.4 3.6* 2.8*  CL 90* 91* 94*  CO2 26 25 26   BUN 27* 21 14  CREATININE 0.73 0.82 0.66  GLUCOSE 97 185* 151*   Electrolytes  Recent Labs Lab 05/02/14 0532 05/04/14 0420 05/05/14 0425  CALCIUM 8.0* 6.5* 5.9*  MG  --  1.9  --   PHOS  --  3.2  --    Sepsis Markers  Recent Labs Lab 04/30/14 1707 04/30/14 1709 05/01/14 0244 05/02/14 0532  LATICACIDVEN 1.3  --   --   --   PROCALCITON  --  2.33 2.10 0.85   ABG  Recent Labs Lab 05/03/14 1658 05/04/14 0418 05/04/14 1020  PHART 7.362 7.342* 7.400  PCO2ART 55.1* 53.2* 47.4*  PO2ART 77.0* 119.0* 85.0   Liver Enzymes  Recent Labs Lab 05/01/14 0244 05/05/14 0425  AST 63* 29  ALT 45* 19  ALKPHOS 183* 103  BILITOT 0.6 0.6  ALBUMIN 3.0* 1.6*   Cardiac Enzymes  Recent Labs Lab 04/29/14 2248 04/30/14 1707  PROBNP 1774.0* 2780.0*   Glucose  Recent Labs Lab 05/03/14 1955  GLUCAP 165*    Imaging Dg Chest Port 1 View  05/05/2014   CLINICAL DATA:  Status post decortication of the right pleural space for empyema  EXAM: PORTABLE CHEST - 1 VIEW  COMPARISON:  DG CHEST 1V PORT dated 05/04/2014  FINDINGS: There has been interval extubation of the trachea and esophagus. Obscuration of the right heart border. Mild prominence of the cardiopericardial silhouette. The left lung adequately inflated and clear. On the right there is persistent volume loss. Slightly increased fluid in the minor fissure is present. Two chest tubes in place and stable with tips in the upper  hemithorax. Right subclavian venous catheter tip in mid portion of the SVC.  IMPRESSION: 1. Slightly increased pleural space fluid on the right with decreased aeration since yesterday's study. Two right-sided chest tubes in stable position 2. Stable prominence of the cardiac silhouette and central pulmonary vascularity. 3. Interval extubation of the trachea and esophagus.   Electronically Signed   By: David  Martinique   On: 05/05/2014 07:38   Dg Chest Port 1 View  05/04/2014   CLINICAL DATA:  Status post VATS  EXAM: PORTABLE CHEST - 1 VIEW  COMPARISON:  05/03/2014  FINDINGS: An endotracheal tube is noted 2 cm above the carina and stable in position. A nasogastric catheter seen within the stomach which is new from the prior exam. A right-sided subclavian line is noted within the right atrium. This is somewhat accentuated by the poor inspiratory effort. Multiple right-sided chest tubes are again identified and stable. No pneumothorax is seen. The left lung is clear. Persistent increased density is noted in the right lung stable from the previous exam. No new focal abnormality is seen.  IMPRESSION: New nasogastric catheter in satisfactory position.  Postsurgical changes on the right stable in appearance.  No new focal abnormality is seen.   Electronically Signed   By: Inez Catalina M.D.   On: 05/04/2014 09:08   Portable Chest Xray  05/03/2014   CLINICAL DATA:  Endotracheal tube placement  EXAM: PORTABLE CHEST - 1 VIEW  COMPARISON:  Prior radiograph from earlier the same day  FINDINGS: There has been interval placement of an endotracheal tube with tip located 1.9 cm above the carina. Right subclavian central venous catheter has also been placed with tip overlying the cavoatrial junction.  Large pigtail right-sided chest tube has been removed with placement of 2 new chest tubes into the right hemi thorax. The tip of 1 chest tube overlies the lateral right upper lobe. The second tip overlies the right lung apex. A  moderate right pleural effusion is similar as compared to prior study. There is patchy opacity within the mid and lower right lung which may reflect atelectasis, effusion, or infiltrate. The left lung is grossly clear.  No definite pneumothorax now seen.  IMPRESSION: 1. Tip of the endotracheal tube 1.9 cm above the carina. 2. Interval placement of right subclavian central venous catheter with tip overlying the cavoatrial junction. 3. Interval removal of pigtail right chest tube with placement of 2 new right-sided chest tubes as above. No pneumothorax  now identified. 4. Persistent moderate to large right pleural effusion. 5. Patchy right mid and lower lung opacity, which may reflect atelectasis, effusion, or infiltrate.   Electronically Signed   By: Jeannine Boga M.D.   On: 05/03/2014 17:35   CXR: 5/17 congestive heart failure with  pulmonary interstitial edema. A moderate size right pleural effusion   ASSESSMENT / PLAN:  PULMONARY A: Acute Respiratory failure, Hypoxic; Multifactorial secondary to PNA, Asthma exacerbation, Heart Failure. /pleural effusion -empyema s/p VATS  - has laryngeal voice? Some VCD going on as well Asthma/reactive airways  P:   - O2 to keep sat >90%   - Cont Nebs ipratropium, Xopenex.  -chest tubes per TCTS   CARDIOVASCULAR A: Atrial Fib w/ RVR >now in NSR  CHF   P:  - Cont diuresis - F/u echo. - Off xarelto,ct  Heparin-can change to lovenox once chest tubes out,resume xarelto on dc - Cont metoprolol to prn  -ct cardizem PO  RENAL A:  Hyponatremia  hypokalemia P:   - BMET in AM. - Replace electrolytes as indicated. - Lasix on hold  GASTROINTESTINAL A:  GERD  P:   - PPI.  HEMATOLOGIC A:   P:  - Follow H/H.  INFECTIOUS A:  RLL PNA  P:   - - Continue Vancomycin and primaxin.  - Follow VATS cx    ENDOCRINE A:  Hyperglycemia   P:   - SSI.  NEUROLOGIC A:  Anxiety /Agitation / Baseline fibromylagia - on xanax, oxycodone, zoloft,  ultram at baseline Currently acute right chest pain due to  P:   - use fentanyl for pain prn. - Low dose xanax if needed for anxity. - Hold oxycodone, zoloft and ultram for now. - Monitor closley.  MSK A: RLE in cast from recent surgery Duplex LE for DVT negative,CT Angio neg for PE  P  On heparin for a-fib regardless.  TODAY'S SUMMARY: 78 yo former smoker with RLL PNA , empyema,extubated postop but continus to have upper airway issues & deconditioning   I have personally obtained a history, examined the patient, evaluated laboratory and imaging results, formulated the assessment and plan and placed orders.   CC time x 56m  Kara Mead MD. FCCP. [facility] Pager [phone] If no response call [phone]

## 2014-05-05 NOTE — Evaluation (Signed)
Physical Therapy Evaluation Patient Details Name: [name] MRN: [id number] DOB: [birth year] Today's Date: 05/05/2014   History of Present Illness  78 y.o. female adm with PNA, empyema. Underwent VATS on 05/03/14. Pt with recent REMOVAL OF DEEP IMPLANTS X 3  and REVISION OF SUBTALOR ARTHRODESIS on RIGHT and will be NWB on RLE for a couple more weeks.  Clinical Impression  Pt admitted with above. Pt currently with functional limitations due to the deficits listed below (see PT Problem List).  Pt will benefit from skilled PT to increase their independence and safety with mobility to allow discharge back to SNF for rehab. Pt much weaker now than after ankle surgery and will continue to require SNF.     Follow Up Recommendations SNF    Equipment Recommendations  Other (comment) (To be determined)    Recommendations for Other Services       Precautions / Restrictions Precautions Precautions: Fall Restrictions Weight Bearing Restrictions: Yes LLE Weight Bearing: Non weight bearing      Mobility  Bed Mobility Overal bed mobility: Needs Assistance Bed Mobility: Supine to Sit     Supine to sit: +2 for physical assistance;Max assist     General bed mobility comments: Assist to bring legs over and elevate trunk and hips to EOB.  Transfers Overall transfer level: Needs assistance   Transfers: Squat Pivot Transfers     Squat pivot transfers: +2 physical assistance;Total assist     General transfer comment: Pt unable to bear all her weight on lt leg and unable to achieve standing. Used bed pad to pivot pt to chair.  Ambulation/Gait                Stairs            Wheelchair Mobility    Modified Rankin (Stroke Patients Only)       Balance Overall balance assessment: Needs assistance Sitting-balance support: Bilateral upper extremity supported;Feet supported Sitting balance-Leahy Scale: Poor         Standing balance comment: Unable to stand with +2  total assist                             Pertinent Vitals/Pain VSS    Home Living Family/patient expects to be discharged to:: Skilled nursing facility                      Prior Function Level of Independence: Needs assistance   Gait / Transfers Assistance Needed: Was amb very short distances with assist of therapy and rolling walker. NWB on rt foot.           Hand Dominance   Dominant Hand: Right    Extremity/Trunk Assessment   Upper Extremity Assessment: Generalized weakness           Lower Extremity Assessment: Generalized weakness;RLE deficits/detail RLE Deficits / Details: Pt with short leg cast on.       Communication      Cognition Arousal/Alertness: Awake/alert Behavior During Therapy: WFL for tasks assessed/performed Overall Cognitive Status: Difficult to assess                      General Comments      Exercises        Assessment/Plan    PT Assessment Patient needs continued PT services  PT Diagnosis Difficulty walking;Generalized weakness;Acute pain   PT Problem List Decreased strength;Decreased activity tolerance;Decreased balance;Decreased  mobility;Pain  PT Treatment Interventions DME instruction;Functional mobility training;Therapeutic activities;Therapeutic exercise;Balance training;Patient/family education   PT Goals (Current goals can be found in the Care Plan section) Acute Rehab PT Goals Patient Stated Goal: didn't state PT Goal Formulation: With patient Time For Goal Achievement: 05/12/14 Potential to Achieve Goals: Fair    Frequency Min 2X/week   Barriers to discharge        Co-evaluation               End of Session Equipment Utilized During Treatment: Other (comment) (no gait belt due to chest tube.) Activity Tolerance: Patient limited by fatigue Patient left: in chair;with call bell/phone within reach;with family/visitor present Nurse Communication: Mobility status;Need for lift  equipment         Time: 5732-2025 PT Time Calculation (min): 32 min   Charges:   PT Evaluation $Initial PT Evaluation Tier I: 1 Procedure PT Treatments $Therapeutic Activity: 8-22 mins   PT G CodesShary Decamp Layla Gramm 05/05/2014, 1:47 PM  [employer] PT [phone]

## 2014-05-05 NOTE — Op Note (Signed)
[name], [name] NO.:  [id number]  MEDICAL RECORD NO.:  [id number]  LOCATION:  2S16C                        FACILITY:  [facility]  PHYSICIAN:  Lanelle Bal, MD    DATE OF BIRTH:  1936-09-16  DATE OF PROCEDURE:  05/03/2014 DATE OF DISCHARGE:                              OPERATIVE REPORT   PREOPERATIVE DIAGNOSIS:  Right lower lobe hospital-acquired pneumonia with empyema.  POSTOPERATIVE DIAGNOSIS:  Right lower lobe hospital-acquired pneumonia with empyema.  SURGICAL PROCEDURE:  Bronchoscopy, right video-assisted thoracoscopy, minithoracotomy, decortication.  SURGEON:  Lanelle Bal, MD.  FIRST ASSISTANT:  Providence Crosby, PA  BRIEF HISTORY:  The patient is a 78 year old female who in mid April had revision surgery to her right foot and was in a cast.  Following this, she was in a skilled nursing facility.  She developed increasing cough the day prior to admission, presented to the [facility] Emergency Room in rapid atrial fibrillation.  Further evaluation with chest x-ray showed severely consolidated right lower lobe.  CT scan of the chest confirmed moderate-sized possibly loculated effusion and consolidation of the right lower lobe.  Because of intermittent atrial fibrillation, the patient had been on Xarelto.  She was transferred to [facility] for further treatment.  Appropriate antibiotics were started.  The patient's Xarelto was stopped.  Attempted thoracentesis was unsuccessful and CT-guided pigtail drainage of the effusion also was unsuccessful.  I then recommended to the patient that we proceed with bronchoscopy, right video-assisted thoracoscopy, and drainage of empyema.  She agreed and signed informed consent.  She and her family both aware that with her fragile overall medical condition, and severe right lower lobe pneumonia.  The period of prolonged ventilation was possible postoperatively.  DESCRIPTION OF PROCEDURE:  The patient was  brought from the ICU to the operating room, arterial and central lines were placed.  She underwent general endotracheal anesthesia with a single-lumen endotracheal tube. Through this, a fiberoptic bronchoscopy was performed that demonstrated significant pulmonary secretions throughout the tracheobronchial tree but particularly tenacious in the right lower lobe.  This area was suctioned and irrigated well and material sent for culture.  The scope was removed.  She was turned in lateral decubitus position with the right side up.  A second time-out was performed and the site confirmed the previously placed pigtail had been removed.  The patient's chest was prepped with Betadine and draped in sterile manner.  Incision was made in the lower posterior axillary line at approximately the seventh intercostal space.  The patient has significant loculated effusion through the port incision which had been expanded slightly.  We were able to decorticate the lung and drained the pleural effusion.  Two Blake drains were then left in place.  Appropriate cultures and pathology were sent and the proteinaceous debris removed with the decortication.  The lung was then reinflated.  Pericostal sutures were placed with the lower rib drilled with a small hole.  The muscle layers were closed with interrupted 0 Vicryl, running 2-0 Vicryl in subcutaneous tissue, and 3-0 subcuticular stitch in skin edges. Dermabond was applied.  The patient was left intubated and was transferred to the Surgical Intensive Care Unit for  further postoperative care.  Blood loss was approximately 100 mL.  Sponge and needle count was reported as correct at the completion of the procedure.  The patient tolerated the procedure without obvious complication.     Lanelle Bal, MD     EG/MEDQ  D:  05/05/2014  T:  05/05/2014  Job:  [id number]

## 2014-05-05 NOTE — Evaluation (Signed)
Clinical/Bedside Swallow Evaluation Patient Details  Name: [name] MRN: [id number] Date of Birth: 1936-09-02  Today's Date: 05/05/2014 Time: 1410-1430 SLP Time Calculation (min): 20 min  Past Medical History:  Past Medical History  Diagnosis Date  . GERD (gastroesophageal reflux disease)   . Anxiety disorder   . Arthritis   . CAD (coronary artery disease)     Cath May 2010.  Nonbstructive  . Depression   . Hypothyroid   . Asthmatic bronchitis   . Hypertension     dr Percival Spanish  . OA (osteoarthritis)   . Chronic bronchitis   . 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 Encephalitis     d/t meningitis  . PONV (postoperative nausea and vomiting)     history of cardiac arrest day 1 post surgery  in 2008  . Esophageal motility disorder   . Atrial fibrillation   . Hyperlipidemia   . Fibromyalgia   . Vitamin D deficiency   . Status post dilation of esophageal narrowing   . Bowel obstruction     blockage   Past Surgical History:  Past Surgical History  Procedure Laterality Date  . Total knee arthroplasty Right   . Back surgery    . Coronary angioplasty with stent placement  2003  . Sinus surgery with instatrak    . Knee arthroscopy  03/26/2012    Procedure: ARTHROSCOPY KNEE;  Surgeon: Wylene Simmer, MD;  Location: [place];  Service: Orthopedics;  Laterality: Left;  with Debridement of Lateral Meniscus tear  . Rotator cuff repair Bilateral   . Ankle fusion  08/27/2012    Procedure: ARTHRODESIS ANKLE;  Surgeon: Wylene Simmer, MD;  Location: [place];  Service: Orthopedics;  Laterality: Right;  Arthrodesis right ankle and subtalar joint  . Foot arthrodesis, subtalar Right 2013  . Removal of implant Right 03/31/2014    DR HEWITT  . Hardware revision  03/31/2014    SUBTALOR  ARTHRODESIS       DR HEWITT  . Hardware removal Right 03/31/2014    Procedure: REMOVAL OF DEEP IMPLANTS X 3  RIGHT ;  Surgeon: Wylene Simmer, MD;  Location: [place];  Service: Orthopedics;   Laterality: Right;  . Arthrodesis tibiofibular Right 03/31/2014    Procedure: REVISION OF SUBTALOR ARTHRODESIS  RIGHT ;  Surgeon: Wylene Simmer, MD;  Location: [place];  Service: Orthopedics;  Laterality: Right;   HPI:  14 yoWF N[facility] remote ex smoker admitted to [facility] 5/15 for acute resp failure for RLL PNA, pleural effusion and atrial fib with rVR. Transfered to [facility] ICU 5/16 d/t worsening resp status. Underwent VATS drainage of right empyema on 5/19, extubated on 5/20. Pulmonoligst questions VCD (possible abnormal phonation prior to intubation). History includes esophageal dilation for hypertensive LES (Dr Olevia Perches) & is on protonix two times a day. Pt has an MBS 01/07/14 showing normal oropharyngeal function, probable primary esophageal dysphagia. Pt coughs unrelated to PO intake.    Assessment / Plan / Recommendation Clinical Impression  Pt demonstrates multiple risk factors for aspiration and has immediate cough following sips of liquids. Given aphonia following intubation, weak cough, overt evidence of aspriation and history of esophageal dysphagia, would not recommend diet yet. Pt may have ice chips after oral care, take necessary meds in puree, crushed if large. Pt may need MBS (FEES not available) prior to diet, but unsure if pt can participate with chest tube. Will f/u tomorrow and discuss with MD then.  Aspiration Risk  Severe    Diet Recommendation NPO except meds;Ice chips PRN after oral care        Other  Recommendations Oral Care Recommendations: Oral care Q4 per protocol   Follow Up Recommendations       Frequency and Duration min 2x/week  2 weeks   Pertinent Vitals/Pain NA    SLP Swallow Goals     Swallow Study Prior Functional Status       General HPI: 4 yoWF NHR remote ex smoker admitted to [facility] 5/15 for acute resp failure for RLL PNA, pleural effusion and atrial fib with rVR. Transfered to [facility] ICU 5/16 d/t worsening resp status. Underwent VATS drainage of right empyema on  5/19, extubated on 5/20. Pulmonoligst questions VCD (possible abnormal phonation prior to intubation). History includes esophageal dilation for hypertensive LES (Dr Olevia Perches) & is on protonix two times a day. Pt has an MBS 01/07/14 showing normal oropharyngeal function, probable primary esophageal dysphagia. Pt coughs unrelated to PO intake.  Type of Study: Bedside swallow evaluation Diet Prior to this Study: NPO Temperature Spikes Noted: No Respiratory Status: Nasal cannula History of Recent Intubation: Yes Length of Intubations (days): 2 days Date extubated: 05/04/14 Behavior/Cognition: Alert;Cooperative;Pleasant mood Oral Cavity - Dentition: Adequate natural dentition (partial, at bedside) Self-Feeding Abilities: Able to feed self;Needs assist Patient Positioning: Upright in chair Baseline Vocal Quality: Aphonic Volitional Cough: Weak Volitional Swallow: Able to elicit    Oral/Motor/Sensory Function Overall Oral Motor/Sensory Function: Appears within functional limits for tasks assessed   Ice Chips Ice chips: Within functional limits Presentation: Spoon   Thin Liquid Thin Liquid: Impaired Presentation: Cup Pharyngeal  Phase Impairments: Multiple swallows;Cough - Immediate    Nectar Thick Nectar Thick Liquid: Not tested   Honey Thick Honey Thick Liquid: Not tested   Puree Puree: Impaired Presentation: Spoon Pharyngeal Phase Impairments: Multiple swallows;Cough - Delayed   Solid   GO    Solid: Not tested      [name], MA CCC-SLP [phone]  Katherene Ponto Ramzy Cappelletti 05/05/2014,2:40 PM

## 2014-05-05 NOTE — Progress Notes (Signed)
[facility] ADULT ICU REPLACEMENT PROTOCOL FOR AM LAB REPLACEMENT ONLY  The patient does apply for the [facility] Adult ICU Electrolyte Replacment Protocol based on the criteria listed below:   1. Is GFR >/= 40 ml/min? yes  Patient's GFR today is 84 2. Is urine output >/= 0.5 ml/kg/hr for the last 6 hours? yes Patient's UOP is 0.7 ml/kg/hr 3. Is BUN < 60 mg/dL? yes  Patient's BUN today is 14 4. Abnormal electrolyte(s): K 2.8 5. Ordered repletion with: per protocol 6. If a panic level lab has been reported, has the CCM MD in charge been notified? yes.   Physician:  Dr Reine Just 05/05/2014 5:52 AM

## 2014-05-05 NOTE — Progress Notes (Signed)
[facility] Progress Note Patient Name: [name] DOB: 1935/12/23 MRN: [id number]  Date of Service  05/05/2014   HPI/Events of Note   hypocalcemia  eICU Interventions  repeleted   Intervention Category Minor Interventions: Electrolytes abnormality - evaluation and management  Juanito Doom 05/05/2014, 9:26 PM

## 2014-05-05 NOTE — Progress Notes (Signed)
criticalCRITICAL VALUE ALERT  Critical value received:  Potassium 2.8 and Calcium 5.9  Date of notification:  05/05/2014  Time of notification:  6967  Critical value read back: yes  Nurse who received alert:  Allen Derry, RN  MD notified (1st page):  [place] Notified 0541  No further orders received at this time.  Will continue to monitor carefully.

## 2014-05-05 NOTE — Progress Notes (Signed)
05/05/14   Pharmacy- Vancomycin 1650   Vancomycin Trough 18.6  A/P:  78yo female on Vancomycin 1000mg  IV q12 for HCAP, empyema.  Pt has been AFeb and WBC is improved today.  Cr is < 1.  Though trough did not result until almost 1700, I did verify with RN that it was drawn appropriately at 1425.  Therefore this is a true trough as dose was due at 1430.  Pleural tissue culture is (+) staph aureus with sensitivities pending; all other cultures are NTD.  1-  Continue Vancomycin 1000mg  IV q12 2-  Watch renal fxn 3-  F/U culture data.  Gracy Bruins, PharmD Clinical Pharmacist [facility]

## 2014-05-05 NOTE — Progress Notes (Signed)
[name] for Heparin Indication: atrial fibrillation  Allergies  Allergen Reactions  . Aspirin Other (See Comments)    REACTION: regular strength causes "heart to beat fast"  . Captopril Hypertension  . Naproxen Other (See Comments)    Tongue swelling  . Penicillins     Swelling around site  . Sulfonamide Derivatives Hives  . Clindamycin/Lincomycin     Labs:  Recent Labs  05/03/14 0339 05/03/14 0900 05/04/14 0420 05/04/14 1800 05/05/14 0425  HGB 12.9  --  12.4  --  9.9*  HCT 38.1  --  37.5  --  29.7*  PLT 269  --  246  --  246  HEPARINUNFRC  --  0.32  --  0.32 0.30  CREATININE  --   --  0.82  --  0.66    Estimated Creatinine Clearance: 51.6 ml/min (by C-G formula based on Cr of 0.66).  Assessment: 78 yo female with PNA/pleural effusion, on Xarelto PTA- last dose 5/15. Heparin discontinued after blood found in chest post thoracentesis 5/17. Pt also had R chest drain placed 5/18.  Now s/p VATS  Heparin level therapeutic  Goal of Therapy:  Heparin level 0.3-0.7 units/ml aPTT 66-102 seconds Monitor platelets by anticoagulation protocol: Yes   Plan:  Continue heparin at 1000 units / hr Daily heparin level, CBC  Thank you. Anette Guarneri, PharmD [phone]  05/05/2014,12:12 PM

## 2014-05-05 NOTE — Progress Notes (Addendum)
Patient ID: [name], female   DOB: [birth year], 78 y.o.   MRN: [id number] TCTS DAILY ICU PROGRESS NOTE                   [address]            [place] [address]          [phone]   2 Days Post-Op Procedure(s) (LRB): VIDEO BRONCHOSCOPY (N/A) VIDEO ASSISTED THORACOSCOPY (VATS)/EMPYEMA (Right) DECORTICATION (Right)  Total Length of Stay:  LOS: 6 days   Subjective: Still weak, tolerating extubation   Objective: Vital signs in last 24 hours: Temp:  [98.2 F (36.8 C)-98.4 F (36.9 C)] 98.4 F (36.9 C) (05/21 0400) Pulse Rate:  [37-113] 72 (05/21 0800) Cardiac Rhythm:  [-] Atrial fibrillation (05/21 0800) Resp:  [9-23] 13 (05/21 0800) BP: (97-150)/(38-95) 108/47 mmHg (05/21 0800) SpO2:  [91 %-100 %] 100 % (05/21 0800) Arterial Line BP: (91-157)/(39-66) 124/49 mmHg (05/21 0800) Weight:  [158 lb 1.1 oz (71.7 kg)] 158 lb 1.1 oz (71.7 kg) (05/21 0500)  Filed Weights   05/03/14 0500 05/04/14 0342 05/05/14 0500  Weight: 153 lb 8 oz (69.627 kg) 154 lb 8.7 oz (70.1 kg) 158 lb 1.1 oz (71.7 kg)    Weight change: 3 lb 8.4 oz (1.6 kg)   Hemodynamic parameters for last 24 hours:    Intake/Output from previous day: 05/20 0701 - 05/21 0700 In: 3015 [I.V.:2265; IV Piggyback:750] Out: 860 [Urine:770; Chest Tube:90]  Intake/Output this shift: Total I/O In: 160 [I.V.:110; IV Piggyback:50] Out: 100 [Urine:100]  Current Meds: Scheduled Meds: . acetaminophen  1,000 mg Oral 4 times per day   Or  . acetaminophen (TYLENOL) oral liquid 160 mg/5 mL  1,000 mg Oral 4 times per day  . antiseptic oral rinse  15 mL Mouth Rinse QID  . atorvastatin  10 mg Oral q1800  . bisacodyl  10 mg Oral Daily  . chlorhexidine  15 mL Mouth Rinse BID  . diltiazem  30 mg Oral 4 times per day  . fluticasone  2 spray Each Nare Daily  . imipenem-cilastatin  500 mg Intravenous 3 times per day  . ipratropium  0.5 mg Nebulization Q6H  . levalbuterol  0.63 mg Nebulization Q6H  .  levothyroxine  50 mcg Oral QAC breakfast  . metoprolol tartrate  25 mg Oral BID  . montelukast  10 mg Oral QHS  . pantoprazole (PROTONIX) IV  40 mg Intravenous Daily  . potassium chloride  10 mEq Intravenous Q1H  . senna-docusate  1 tablet Oral QHS  . sertraline  100 mg Oral QHS  . sodium chloride  10-40 mL Intracatheter Q12H  . sodium chloride  3 mL Intravenous Q12H  . vancomycin  1,000 mg Intravenous Q12H   Continuous Infusions: . sodium chloride 10 mL/hr at 05/02/14 1856  . dextrose 5 % and 0.45% NaCl 100 mL/hr at 05/05/14 0800  . heparin 1,000 Units/hr (05/05/14 0800)   PRN Meds:.ALPRAZolam, ALPRAZolam, fentaNYL, HYDROmorphone (DILAUDID) injection, levalbuterol, metoprolol, ondansetron (ZOFRAN) IV, sodium chloride  General appearance: fatigued, mild distress and slowed mentation Neurologic: intact Heart: regular rate and rhythm, S1, S2 normal, no murmur, click, rub or gallop Lungs: diminished breath sounds bilaterally Abdomen: soft, non-tender; bowel sounds normal; no masses,  no organomegaly Extremities: extremities normal, atraumatic, no cyanosis or edema and Homans sign is negative, no sign of DVT Wound: no air leak from chest tube  Lab Results: CBC: Recent Labs  05/04/14 0420 05/05/14 0425  WBC 26.4* 22.5*  HGB 12.4 9.9*  HCT 37.5 29.7*  PLT 246 246   BMET:  Recent Labs  05/04/14 0420 05/05/14 0425  NA 130* 131*  K 3.6* 2.8*  CL 91* 94*  CO2 25 26  GLUCOSE 185* 151*  BUN 21 14  CREATININE 0.82 0.66  CALCIUM 6.5* 5.9*    PT/INR: No results found for this basename: LABPROT, INR,  in the last 72 hours Radiology: Dg Chest Port 1 View  05/05/2014   CLINICAL DATA:  Status post decortication of the right pleural space for empyema  EXAM: PORTABLE CHEST - 1 VIEW  COMPARISON:  DG CHEST 1V PORT dated 05/04/2014  FINDINGS: There has been interval extubation of the trachea and esophagus. Obscuration of the right heart border. Mild prominence of the cardiopericardial  silhouette. The left lung adequately inflated and clear. On the right there is persistent volume loss. Slightly increased fluid in the minor fissure is present. Two chest tubes in place and stable with tips in the upper hemithorax. Right subclavian venous catheter tip in mid portion of the SVC.  IMPRESSION: 1. Slightly increased pleural space fluid on the right with decreased aeration since yesterday's study. Two right-sided chest tubes in stable position 2. Stable prominence of the cardiac silhouette and central pulmonary vascularity. 3. Interval extubation of the trachea and esophagus.   Electronically Signed   By: David  Martinique   On: 05/05/2014 07:38   Culture from tissue staph   Assessment/Plan: S/P Procedure(s) (LRB): VIDEO BRONCHOSCOPY (N/A) VIDEO ASSISTED THORACOSCOPY (VATS)/EMPYEMA (Right) DECORTICATION (Right) Mobilize leave chest tubes in place Need pt  Needs swallowing evauation    [name] 05/05/2014 8:09 AM

## 2014-05-06 ENCOUNTER — Inpatient Hospital Stay (HOSPITAL_COMMUNITY): Payer: PRIVATE HEALTH INSURANCE

## 2014-05-06 ENCOUNTER — Encounter (HOSPITAL_COMMUNITY): Payer: Self-pay | Admitting: Cardiothoracic Surgery

## 2014-05-06 LAB — BASIC METABOLIC PANEL
BUN: 8 mg/dL (ref 6–23)
BUN: 7 mg/dL (ref 6–23)
CO2: 22 mEq/L (ref 19–32)
CO2: 22 mEq/L (ref 19–32)
Calcium: 5.6 mg/dL — CL (ref 8.4–10.5)
Calcium: 6.5 mg/dL — ABNORMAL LOW (ref 8.4–10.5)
Chloride: 94 mEq/L — ABNORMAL LOW (ref 96–112)
Chloride: 95 mEq/L — ABNORMAL LOW (ref 96–112)
Creatinine, Ser: 0.57 mg/dL (ref 0.50–1.10)
Creatinine, Ser: 0.58 mg/dL (ref 0.50–1.10)
GFR calc Af Amer: >90 mL/min (ref >90)
GFR calc Af Amer: >90 mL/min (ref >90)
GFR calc non Af Amer: 88 mL/min — ABNORMAL LOW (ref >90)
GFR calc non Af Amer: 87 mL/min — ABNORMAL LOW (ref >90)
Glucose, Bld: 294 mg/dL — ABNORMAL HIGH (ref 70–99)
Glucose, Bld: 111 mg/dL — ABNORMAL HIGH (ref 70–99)
Potassium: 3.5 mEq/L — ABNORMAL LOW (ref 3.7–5.3)
Potassium: 4 mEq/L (ref 3.7–5.3)
Sodium: 128 mEq/L — ABNORMAL LOW (ref 137–147)
Sodium: 129 mEq/L — ABNORMAL LOW (ref 137–147)

## 2014-05-06 LAB — TISSUE CULTURE

## 2014-05-06 LAB — CBC
HCT: 32.3 % — ABNORMAL LOW (ref 36.0–46.0)
Hemoglobin: 11.1 g/dL — ABNORMAL LOW (ref 12.0–15.0)
MCH: 32.1 pg (ref 26.0–34.0)
MCHC: 34.4 g/dL (ref 30.0–36.0)
MCV: 93.4 fL (ref 78.0–100.0)
Platelets: 240 10*3/uL (ref 150–400)
RBC: 3.46 MIL/uL — ABNORMAL LOW (ref 3.87–5.11)
RDW: 14.6 % (ref 11.5–15.5)
WBC: 21.2 10*3/uL — ABNORMAL HIGH (ref 4.0–10.5)

## 2014-05-06 LAB — BODY FLUID CULTURE: Culture: NO GROWTH

## 2014-05-06 LAB — HEPARIN LEVEL (UNFRACTIONATED): Heparin Unfractionated: 0.39 IU/mL (ref 0.30–0.70)

## 2014-05-06 MED ORDER — PANTOPRAZOLE SODIUM 40 MG PO TBEC
40.0000 mg | DELAYED_RELEASE_TABLET | Freq: Every day | ORAL | Status: DC
  Administered 2014-05-07 – 2014-05-08 (×2): 40 mg via ORAL
  Filled 2014-05-06 (×2): qty 1

## 2014-05-06 MED ORDER — ENOXAPARIN SODIUM 80 MG/0.8ML ~~LOC~~ SOLN
1.0000 mg/kg | Freq: Two times a day (BID) | SUBCUTANEOUS | Status: DC
  Administered 2014-05-06 – 2014-05-14 (×16): 70 mg via SUBCUTANEOUS
  Filled 2014-05-06 (×18): qty 0.8

## 2014-05-06 NOTE — Progress Notes (Signed)
PULMONARY / CRITICAL CARE MEDICINE   Name: [name] MRN: [id number] DOB: 09-04-1936    ADMISSION DATE:  04/29/2014 CONSULTATION DATE:  5/16   REFERRING MD :  [facility]  PRIMARY SERVICE:  PCCM   CHIEF COMPLAINT:  RLL PNA/Effusion [name]   BRIEF PATIENT DESCRIPTION:  72 yoWF NHR remote ex smoker admitted to [facility] 5/15 for acute resp failure for RLL PNA , pleural effusion and atrial fib with rVR .Transfered to [facility] ICU 5/16 d/t worsening resp status , PCCM to admit .  RA office pt w/ RAD (nml spiro 2014 )  Recent adm to rehab after ankle surgery 4/16-4/20 [facility]  SIGNIFICANT EVENTS / STUDIES:  5/15 CT chest >neg for PE , RLL consolidation/mod effusion  5/16 transfer from [facility] to [facility] ICU  5/19 VATS drainange of rt empyema  LINES / TUBES: piv Right pig tail via IR 5/18>>>  CULTURES: 5/15 BC x 2 >>ng Pleural 5/19>>>ng Tissue 5/19 pleural peel >> MRSA RVP >> para influenza  ANTIBIOTICS: 5/15 Vanc  5/15 Primaxin   SUBJECTIVE:   Afebrile Voice hoarse -improving C/o dyspnea  VITAL SIGNS: Temp:  [98.3 F (36.8 C)-99.6 F (37.6 C)] 98.8 F (37.1 C) (05/22 1224) Pulse Rate:  [63-130] 76 (05/22 1500) Resp:  [13-35] 18 (05/22 1500) BP: (108-149)/(47-110) 126/103 mmHg (05/22 1500) SpO2:  [97 %-100 %] 100 % (05/22 1500) Weight:  [72.3 kg (159 lb 6.3 oz)] 72.3 kg (159 lb 6.3 oz) (05/22 0500) HEMODYNAMICS:   VENTILATOR SETTINGS:   Now on [name] oxygen sats OK  INTAKE / OUTPUT: Intake/Output     05/21 0701 - 05/22 0700 05/22 0701 - 05/23 0700   P.O.  200   I.V. (mL/kg) 2440 (33.7) 550 (7.6)   IV Piggyback 1060 300   Total Intake(mL/kg) 3500 (48.4) 1050 (14.5)   Urine (mL/kg/hr) 2225 (1.3) 1200 (1.9)   Chest Tube 80 (0) 10 (0)   Total Output 2305 1210   Net +1195 -160         PHYSICAL EXAMINATION: General:  Anxious, no respiratory distress Neuro:  A/o x 3 follows commands  HEENT:  Intact  Cardiovascular:  RRR , no m/r/g  Lungs: diminished bs in bases , rt chest  tube -no leak Abdomen:  Soft nt , bs + Musculoskeletal:  Intact . RLE distal incast Skin:  Intact   LABS:  CBC  Recent Labs Lab 05/04/14 0420 05/05/14 0425 05/06/14 0310  WBC 26.4* 22.5* 21.2*  HGB 12.4 9.9* 11.1*  HCT 37.5 29.7* 32.3*  PLT 246 246 240   Coag's  Recent Labs Lab 05/01/14 0244 05/02/14 0532  APTT 43* 37  INR  --  1.12   BMET  Recent Labs Lab 05/05/14 0425 05/05/14 2000 05/06/14 0310  NA 131* 128* 129*  K 2.8* 3.5* 4.0  CL 94* 94* 95*  CO2 26 22 22   BUN 14 8 7   CREATININE 0.66 0.57 0.58  GLUCOSE 151* 294* 111*   Electrolytes  Recent Labs Lab 05/04/14 0420 05/05/14 0425 05/05/14 2000 05/06/14 0310  CALCIUM 6.5* 5.9* 5.6* 6.5*  MG 1.9  --   --   --   PHOS 3.2  --   --   --    Sepsis Markers  Recent Labs Lab 04/30/14 1707 04/30/14 1709 05/01/14 0244 05/02/14 0532  LATICACIDVEN 1.3  --   --   --   PROCALCITON  --  2.33 2.10 0.85   ABG  Recent Labs Lab 05/03/14 1658 05/04/14  0418 05/04/14 1020  PHART 7.362 7.342* 7.400  PCO2ART 55.1* 53.2* 47.4*  PO2ART 77.0* 119.0* 85.0   Liver Enzymes  Recent Labs Lab 05/01/14 0244 05/05/14 0425  AST 63* 29  ALT 45* 19  ALKPHOS 183* 103  BILITOT 0.6 0.6  ALBUMIN 3.0* 1.6*   Cardiac Enzymes  Recent Labs Lab 04/29/14 2248 04/30/14 1707  PROBNP 1774.0* 2780.0*   Glucose  Recent Labs Lab 05/03/14 1955  GLUCAP 165*    Imaging Dg Chest Port 1 View  05/06/2014   CLINICAL DATA:  pleural effusion  EXAM: PORTABLE CHEST - 1 VIEW  COMPARISON:  May 05, 2014  FINDINGS: There are chest tubes on the right, unchanged in position. There is no demonstrable pneumothorax. Central catheter tip is at the cavoatrial junction. There is persistent consolidation involving portions of the right middle and lower lobes with small right effusion. Left lung is clear. Heart is mildly enlarged with normal pulmonary vascularity, stable. There is atherosclerotic change in aorta.  IMPRESSION: Tube and  catheter positions are unchanged without pneumothorax. There is significant lung collapse on the right with right effusion. Left lung is clear. The overall appearance is stable compared to 1 day prior.   Electronically Signed   By: Lowella Grip M.D.   On: 05/06/2014 08:05   Dg Chest Port 1 View  05/05/2014   CLINICAL DATA:  Status post decortication of the right pleural space for empyema  EXAM: PORTABLE CHEST - 1 VIEW  COMPARISON:  DG CHEST 1V PORT dated 05/04/2014  FINDINGS: There has been interval extubation of the trachea and esophagus. Obscuration of the right heart border. Mild prominence of the cardiopericardial silhouette. The left lung adequately inflated and clear. On the right there is persistent volume loss. Slightly increased fluid in the minor fissure is present. Two chest tubes in place and stable with tips in the upper hemithorax. Right subclavian venous catheter tip in mid portion of the SVC.  IMPRESSION: 1. Slightly increased pleural space fluid on the right with decreased aeration since yesterday's study. Two right-sided chest tubes in stable position 2. Stable prominence of the cardiac silhouette and central pulmonary vascularity. 3. Interval extubation of the trachea and esophagus.   Electronically Signed   By: David  Martinique   On: 05/05/2014 07:38   Dg Swallowing Func-speech Pathology  05/06/2014   [name], CCC-SLP     05/06/2014 11:10 AM Objective Swallowing Evaluation: Modified Barium Swallowing Study   Patient Details  Name: [name] MRN: [id number] Date of Birth: [birth year]  Today's Date: 05/06/2014 Time: 4193-7902 SLP Time Calculation (min): 25 min  Past Medical History:  Past Medical History  Diagnosis Date  . GERD (gastroesophageal reflux disease)   . Anxiety disorder   . Arthritis   . CAD (coronary artery disease)     Cath May 2010.  Nonbstructive  . Depression   . Hypothyroid   . Asthmatic bronchitis   . Hypertension     dr Percival Spanish  . OA (osteoarthritis)   .  Chronic bronchitis   . 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 Encephalitis     d/t meningitis  . PONV (postoperative nausea and vomiting)     history of cardiac arrest day 1 post surgery  in 2008  . Esophageal motility disorder   . Atrial fibrillation   . Hyperlipidemia   . Fibromyalgia   . Vitamin D deficiency   . Status post dilation of esophageal narrowing   .  Bowel obstruction     blockage   Past Surgical History:  Past Surgical History  Procedure Laterality Date  . Total knee arthroplasty Right   . Back surgery    . Coronary angioplasty with stent placement  2003  . Sinus surgery with instatrak    . Knee arthroscopy  03/26/2012    Procedure: ARTHROSCOPY KNEE;  Surgeon: Wylene Simmer, MD;   Location: [place];  Service: Orthopedics;  Laterality: Left;  with  Debridement of Lateral Meniscus tear  . Rotator cuff repair Bilateral   . Ankle fusion  08/27/2012    Procedure: ARTHRODESIS ANKLE;  Surgeon: Wylene Simmer, MD;   Location: [place];  Service: Orthopedics;  Laterality: Right;   Arthrodesis right ankle and subtalar joint  . Foot arthrodesis, subtalar Right 2013  . Removal of implant Right 03/31/2014    DR HEWITT  . Hardware revision  03/31/2014    SUBTALOR  ARTHRODESIS       DR HEWITT  . Hardware removal Right 03/31/2014    Procedure: REMOVAL OF DEEP IMPLANTS X 3  RIGHT ;  Surgeon: Wylene Simmer, MD;  Location: [place];  Service: Orthopedics;  Laterality:  Right;  . Arthrodesis tibiofibular Right 03/31/2014    Procedure: REVISION OF SUBTALOR ARTHRODESIS  RIGHT ;  Surgeon:  Wylene Simmer, MD;  Location: [place];  Service: Orthopedics;   Laterality: Right;  . Video bronchoscopy N/A 05/03/2014    Procedure: VIDEO BRONCHOSCOPY;  Surgeon: [name], MD;   Location: [facility] OR;  Service: Thoracic;  Laterality: N/A;  . Video assisted thoracoscopy (vats)/empyema Right 05/03/2014    Procedure: VIDEO ASSISTED THORACOSCOPY (VATS)/EMPYEMA;   Surgeon: [name], MD;  Location: [place];  Service:  Thoracic;   Laterality: Right;  . Decortication Right 05/03/2014    Procedure: DECORTICATION;  Surgeon: [name], MD;   Location: [place];  Service: Thoracic;  Laterality: Right;   HPI:  56 yoWF NHR remote ex smoker admitted to [facility] 5[facility]15 for acute resp  failure for RLL PNA, pleural effusion and atrial fib with rVR.  Transfered to [facility] ICU 5/16 d/t worsening resp status. Underwent  VATS drainage of right empyema on 5/19, extubated on 5/20.  Pulmonoligst questions VCD (possible abnormal phonation prior to  intubation). History includes esophageal dilation for  hypertensive LES (Dr Olevia Perches) & is on protonix two times a day. Pt  has an MBS 01/07/14 showing normal oropharyngeal function,  probable primary esophageal dysphagia. Pt coughs unrelated to PO  intake.      Assessment / Plan / Recommendation Clinical Impression  Dysphagia Diagnosis: Mild cervical esophageal phase  dysphagia;Suspected primary esophageal dysphagia Clinical impression: Pt presents with an abnormal but functional  oral and oropharyngeal dysphagia. Pt briefly orally holds bolus,  then transits to pharynx with timely laryngeal elevation, but  delayed hyoid excursion. Pt fully protects airway as bolus pauses  in the pharynx and then hyoid pulls open UES for transit through  cerivcal esophagus past prominent cricopharyngeus. No pentration  or aspiration occurred. Pt is recommended to consume thin liquids  and soft solids to facilitate intake and esophageal transit, also  per pts wishes. Esophageal precautions needed: follow solids with  liquids, stay upright 30-60 minutes after meals, crush large  pills in puree. SLP will f/u x1 due to mild risk associated with  decreased cough and aphonia.     Treatment Recommendation  Therapy as outlined in treatment plan below    Diet Recommendation Dysphagia 3 (Mechanical Soft);Thin  liquid   Liquid Administration via: Cup;Straw Medication Administration: Crushed with puree Supervision: Staff to assist with self feeding  Compensations: Slow rate;Small sips/bites;Follow solids with  liquid Postural Changes and/or Swallow Maneuvers: Seated upright 90  degrees;Upright 30-60 min after meal    Other  Recommendations Oral Care Recommendations: Oral care BID   Follow Up Recommendations  Inpatient Rehab    Frequency and Duration min 2x/week  1 week   Pertinent Vitals/Pain NA    SLP Swallow Goals     General HPI: [name] NHR remote ex smoker admitted to [facility] 5[facility] for  acute resp failure for RLL PNA, pleural effusion and atrial fib  with rVR. Transfered to [facility] ICU 5/16 d/t worsening resp status.  Underwent VATS drainage of right empyema on 5/19, extubated on  5/20. Pulmonoligst questions VCD (possible abnormal phonation  prior to intubation). History includes esophageal dilation for  hypertensive LES (Dr Olevia Perches) & is on protonix two times a day. Pt  has an MBS 01/07/14 showing normal oropharyngeal function,  probable primary esophageal dysphagia. Pt coughs unrelated to PO  intake.  Type of Study: Modified Barium Swallowing Study Reason for Referral: Objectively evaluate swallowing function Diet Prior to this Study: NPO Temperature Spikes Noted: No Respiratory Status: Nasal cannula History of Recent Intubation: Yes Length of Intubations (days): 2 days Date extubated: 05/04/14 Behavior/Cognition: Alert;Cooperative;Pleasant mood Oral Cavity - Dentition: Adequate natural dentition Oral Motor / Sensory Function: Within functional limits Self-Feeding Abilities: Able to feed self Patient Positioning: Upright in chair Baseline Vocal Quality: Aphonic Volitional Cough: Weak Volitional Swallow: Able to elicit Anatomy:  (Hypertensive UES, previously dx) Pharyngeal Secretions: Not observed secondary MBS    Reason for Referral Objectively evaluate swallowing function   Oral Phase Oral Preparation/Oral Phase Oral Phase: WFL (slight hesitation with bolus transit)   Pharyngeal Phase Pharyngeal Phase Pharyngeal Phase: Impaired Pharyngeal - Nectar Pharyngeal -  Nectar Teaspoon: Delayed swallow initiation Pharyngeal - Nectar Cup: Delayed swallow initiation Pharyngeal - Thin Pharyngeal - Thin Cup: Delayed swallow initiation Pharyngeal - Thin Straw: Delayed swallow initiation Pharyngeal - Solids Pharyngeal - Puree: Delayed swallow initiation Pharyngeal - Regular: Delayed swallow initiation Pharyngeal - Pill: Delayed swallow initiation  Cervical Esophageal Phase    GO    Cervical Esophageal Phase Cervical Esophageal Phase: Impaired Cervical Esophageal Phase - Comment Cervical Esophageal Comment: Hypertensive UES, but all boluses  passed well, no residuals. Significant esophageal stasis, no  radiologist present to confirm.         [name], [place] CCC-SLP [place] Deblois 05/06/2014, 11:09 AM    CXR: 5/17 congestive heart failure with  pulmonary interstitial edema. A moderate size right pleural effusion   ASSESSMENT / PLAN:  PULMONARY A: Acute Respiratory failure, Hypoxic; Multifactorial secondary to PNA, Asthma exacerbation, Heart Failure. /pleural effusion -empyema s/p VATS  - has component of VCD  Asthma/reactive airways  P:   - O2 to keep sat >90%   - Cont Nebs ipratropium, Xopenex.  -chest tubes per TCTS   CARDIOVASCULAR A: Atrial Fib w/ RVR >now in NSR  CHF  Duplex LE for DVT negative,CT Angio neg for PE  P:  - Cont diuresis - Off xarelto,ct  Heparin-can change to lovenox ,resume xarelto on dc - Cont metoprolol to prn  -ct cardizem PO  RENAL A:  Hyponatremia  hypokalemia P:   - BMET in AM. - Replace electrolytes as indicated. - Lasix on hold  GASTROINTESTINAL A:  GERD  P:   - PPI. -dys 3  diet  HEMATOLOGIC A:   P:  - Follow H/H.  INFECTIOUS A:  RLL PNA ?MRSA empyema  P:   - - Continue Vancomycin and primaxin.    ENDOCRINE A:  Hyperglycemia   P:   - SSI.  NEUROLOGIC A:  Anxiety /Agitation / Baseline fibromylagia - on xanax, oxycodone, zoloft, ultram at baseline P:   - use fentanyl for pain  prn. - Low dose xanax if needed for anxiety. - Hold oxycodone, zoloft and ultram for now. - Monitor closley.  MSK A: RLE in cast from recent surgery  PT consult, wt bearing limited  TODAY'S SUMMARY: 78 yo former smoker with RLL PNA , empyema,extubated postop but continues to have upper airway issues & deconditioning OK tot ransfer to SDU   I have personally obtained a history, examined the patient, evaluated laboratory and imaging results, formulated the assessment and plan and placed orders.   CC time x 85m  Kara Mead MD. FCCP. [place] [facility] Pager [phone] If no response call [phone]

## 2014-05-06 NOTE — Progress Notes (Signed)
TCTS BRIEF SICU PROGRESS NOTE  3 Days Post-Op  S/P Procedure(s) (LRB): VIDEO BRONCHOSCOPY (N/A) VIDEO ASSISTED THORACOSCOPY (VATS)/EMPYEMA (Right) DECORTICATION (Right)   Stable day  Plan: Continue current plan  Rexene Alberts 05/06/2014 8:09 PM

## 2014-05-06 NOTE — Progress Notes (Addendum)
ANTICOAGULATION / Antibiotic CONSULT NOTE   Pharmacy Consult for Heparin  / Vancomycin   / Primaxin Indication: atrial fibrillation  / MRSA infection s/p VATS (pleual tissue)  Allergies  Allergen Reactions  . Aspirin Other (See Comments)    REACTION: regular strength causes "heart to beat fast"  . Captopril Hypertension  . Naproxen Other (See Comments)    Tongue swelling  . Penicillins     Swelling around site  . Sulfonamide Derivatives Hives  . Clindamycin/Lincomycin     Labs:  Recent Labs  05/04/14 0420 05/04/14 1800 05/05/14 0425 05/05/14 2000 05/06/14 0310  HGB 12.4  --  9.9*  --  11.1*  HCT 37.5  --  29.7*  --  32.3*  PLT 246  --  246  --  240  HEPARINUNFRC  --  0.32 0.30  --  0.39  CREATININE 0.82  --  0.66 0.57 0.58    Estimated Creatinine Clearance: 51.8 ml/min (by C-G formula based on Cr of 0.58).  Assessment: 78 yo female with PNA/pleural effusion, on Xarelto PTA- last dose 5/15. Heparin discontinued after blood found in chest post thoracentesis 5/17. Pt also had R chest drain placed 5/18.  Now s/p VATS with pleural tissue culture positive for MRSA  Heparin level therapeutic  Goal of Therapy:  Heparin level 0.3-0.7 units/ml aPTT 66-102 seconds Monitor platelets by anticoagulation protocol: Yes Vancomycin trough = 15 to 20 mcg / dl Appropriate Primaxin dosing   Plan:  Continue heparin at 1000 units / hr Daily heparin level, CBC Continue Vancomycin 1 Gram iv Q 12 (recent trough 5/21) Change Primaxin to 500 mg iv Q 8 hours -- discontinue?  Thank you. Anette Guarneri, PharmD [phone]  05/06/2014,1:08 PM  Addendum:  Asked to change heparin drip to Lovenox.  Will start with Lovenox 70mg  sq q12h one hour after heparin drip discontinued.  Heide Guile, PharmD, BCPS Clinical Pharmacist Pager [phone]

## 2014-05-06 NOTE — Procedures (Signed)
Objective Swallowing Evaluation: Modified Barium Swallowing Study  Patient Details  Name: [name] MRN: [id number] Date of Birth: [birth year]  Today's Date: 05/06/2014 Time: 9371-6967 SLP Time Calculation (min): 25 min  Past Medical History:  Past Medical History  Diagnosis Date  . GERD (gastroesophageal reflux disease)   . Anxiety disorder   . Arthritis   . CAD (coronary artery disease)     Cath May 2010.  Nonbstructive  . Depression   . Hypothyroid   . Asthmatic bronchitis   . Hypertension     dr Percival Spanish  . OA (osteoarthritis)   . Chronic bronchitis   . 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 Encephalitis     d/t meningitis  . PONV (postoperative nausea and vomiting)     history of cardiac arrest day 1 post surgery  in 2008  . Esophageal motility disorder   . Atrial fibrillation   . Hyperlipidemia   . Fibromyalgia   . Vitamin D deficiency   . Status post dilation of esophageal narrowing   . Bowel obstruction     blockage   Past Surgical History:  Past Surgical History  Procedure Laterality Date  . Total knee arthroplasty Right   . Back surgery    . Coronary angioplasty with stent placement  2003  . Sinus surgery with instatrak    . Knee arthroscopy  03/26/2012    Procedure: ARTHROSCOPY KNEE;  Surgeon: Wylene Simmer, MD;  Location: [place];  Service: Orthopedics;  Laterality: Left;  with Debridement of Lateral Meniscus tear  . Rotator cuff repair Bilateral   . Ankle fusion  08/27/2012    Procedure: ARTHRODESIS ANKLE;  Surgeon: Wylene Simmer, MD;  Location: [place];  Service: Orthopedics;  Laterality: Right;  Arthrodesis right ankle and subtalar joint  . Foot arthrodesis, subtalar Right 2013  . Removal of implant Right 03/31/2014    DR HEWITT  . Hardware revision  03/31/2014    SUBTALOR  ARTHRODESIS       DR HEWITT  . Hardware removal Right 03/31/2014    Procedure: REMOVAL OF DEEP IMPLANTS X 3  RIGHT ;  Surgeon: Wylene Simmer, MD;  Location: [place];  Service: Orthopedics;  Laterality: Right;  . Arthrodesis tibiofibular Right 03/31/2014    Procedure: REVISION OF SUBTALOR ARTHRODESIS  RIGHT ;  Surgeon: Wylene Simmer, MD;  Location: [place];  Service: Orthopedics;  Laterality: Right;  . Video bronchoscopy N/A 05/03/2014    Procedure: VIDEO BRONCHOSCOPY;  Surgeon: [name], MD;  Location: [facility];  Service: Thoracic;  Laterality: N/A;  . Video assisted thoracoscopy (vats)/empyema Right 05/03/2014    Procedure: VIDEO ASSISTED THORACOSCOPY (VATS)/EMPYEMA;  Surgeon: [name], MD;  Location: [place];  Service: Thoracic;  Laterality: Right;  . Decortication Right 05/03/2014    Procedure: DECORTICATION;  Surgeon: [name], MD;  Location: [place];  Service: Thoracic;  Laterality: Right;   HPI:  [name] NHR remote ex smoker admitted to [facility] 5[facility]15 for acute resp failure for RLL PNA, pleural effusion and atrial fib with rVR. Transfered to [facility] ICU 5/16 d/t worsening resp status. Underwent VATS drainage of right empyema on 5/19, extubated on 5/20. Pulmonoligst questions VCD (possible abnormal phonation prior to intubation). History includes esophageal dilation for hypertensive LES (Dr Olevia Perches) & is on protonix two times a day. Pt has an MBS 01/07/14 showing normal oropharyngeal function, probable primary esophageal dysphagia. Pt coughs unrelated to PO intake.  Assessment / Plan / Recommendation Clinical Impression  Dysphagia Diagnosis: Mild cervical esophageal phase dysphagia;Suspected primary esophageal dysphagia Clinical impression: Pt presents with an abnormal but functional oral and oropharyngeal dysphagia. Pt briefly orally holds bolus, then transits to pharynx with timely laryngeal elevation, but delayed hyoid excursion. Pt fully protects airway as bolus pauses in the pharynx and then hyoid pulls open UES for transit through cerivcal esophagus past prominent cricopharyngeus. No pentration or aspiration occurred. Pt is recommended to  consume thin liquids and soft solids to facilitate intake and esophageal transit, also per pts wishes. Esophageal precautions needed: follow solids with liquids, stay upright 30-60 minutes after meals, crush large pills in puree. SLP will f/u x1 due to mild risk associated with decreased cough and aphonia.     Treatment Recommendation  Therapy as outlined in treatment plan below    Diet Recommendation Dysphagia 3 (Mechanical Soft);Thin liquid   Liquid Administration via: Cup;Straw Medication Administration: Crushed with puree Supervision: Staff to assist with self feeding Compensations: Slow rate;Small sips/bites;Follow solids with liquid Postural Changes and/or Swallow Maneuvers: Seated upright 90 degrees;Upright 30-60 min after meal    Other  Recommendations Oral Care Recommendations: Oral care BID   Follow Up Recommendations  Inpatient Rehab    Frequency and Duration min 2x/week  1 week   Pertinent Vitals/Pain NA    SLP Swallow Goals     General HPI: [name] NHR remote ex smoker admitted to [facility] 5[facility] for acute resp failure for RLL PNA, pleural effusion and atrial fib with rVR. Transfered to [facility] ICU 5/16 d/t worsening resp status. Underwent VATS drainage of right empyema on 5/19, extubated on 5/20. Pulmonoligst questions VCD (possible abnormal phonation prior to intubation). History includes esophageal dilation for hypertensive LES (Dr Olevia Perches) & is on protonix two times a day. Pt has an MBS 01/07/14 showing normal oropharyngeal function, probable primary esophageal dysphagia. Pt coughs unrelated to PO intake.  Type of Study: Modified Barium Swallowing Study Reason for Referral: Objectively evaluate swallowing function Diet Prior to this Study: NPO Temperature Spikes Noted: No Respiratory Status: Nasal cannula History of Recent Intubation: Yes Length of Intubations (days): 2 days Date extubated: 05/04/14 Behavior/Cognition: Alert;Cooperative;Pleasant mood Oral Cavity - Dentition:  Adequate natural dentition Oral Motor / Sensory Function: Within functional limits Self-Feeding Abilities: Able to feed self Patient Positioning: Upright in chair Baseline Vocal Quality: Aphonic Volitional Cough: Weak Volitional Swallow: Able to elicit Anatomy:  (Hypertensive UES, previously dx) Pharyngeal Secretions: Not observed secondary MBS    Reason for Referral Objectively evaluate swallowing function   Oral Phase Oral Preparation/Oral Phase Oral Phase: WFL (slight hesitation with bolus transit)   Pharyngeal Phase Pharyngeal Phase Pharyngeal Phase: Impaired Pharyngeal - Nectar Pharyngeal - Nectar Teaspoon: Delayed swallow initiation Pharyngeal - Nectar Cup: Delayed swallow initiation Pharyngeal - Thin Pharyngeal - Thin Cup: Delayed swallow initiation Pharyngeal - Thin Straw: Delayed swallow initiation Pharyngeal - Solids Pharyngeal - Puree: Delayed swallow initiation Pharyngeal - Regular: Delayed swallow initiation Pharyngeal - Pill: Delayed swallow initiation  Cervical Esophageal Phase    GO    Cervical Esophageal Phase Cervical Esophageal Phase: Impaired Cervical Esophageal Phase - Comment Cervical Esophageal Comment: Hypertensive UES, but all boluses passed well, no residuals. Significant esophageal stasis, no radiologist present to confirm.         [name], [place] CCC-SLP [place] 05/06/2014, 11:09 AM

## 2014-05-06 NOTE — Progress Notes (Signed)
CRITICAL VALUE ALERT  Critical value received:  MRSA growth on pleural peel  Date of notification:  05/06/14   Critical value read back:yes   MD notified (1st page):  Elsworth Soho, MD   Responding MD:  Elsworth Soho, MD  Time MD responded:  [phone]

## 2014-05-06 NOTE — Progress Notes (Signed)
Patient ID: [name], female   DOB: 01/07/1936, 78 y.o.   MRN: [id number] TCTS DAILY ICU PROGRESS NOTE                   [address]            [place] [address]          [phone]   3 Days Post-Op Procedure(s) (LRB): VIDEO BRONCHOSCOPY (N/A) VIDEO ASSISTED THORACOSCOPY (VATS)/EMPYEMA (Right) DECORTICATION (Right)  Total Length of Stay:  LOS: 7 days   Subjective: Up in chair, very weak voice, failed bedside swallowing eval yesterday   Objective: Vital signs in last 24 hours: Temp:  [98.3 F (36.8 C)-99 F (37.2 C)] 98.6 F (37 C) (05/22 0400) Pulse Rate:  [54-130] 90 (05/22 0600) Cardiac Rhythm:  [-] Normal sinus rhythm (05/22 0400) Resp:  [12-35] 25 (05/22 0600) BP: (98-146)/(37-102) 132/55 mmHg (05/22 0600) SpO2:  [97 %-100 %] 97 % (05/22 0600) Arterial Line BP: (111-124)/(43-49) 122/47 mmHg (05/21 1000) Weight:  [159 lb 6.3 oz (72.3 kg)] 159 lb 6.3 oz (72.3 kg) (05/22 0500)  Filed Weights   05/04/14 0342 05/05/14 0500 05/06/14 0500  Weight: 154 lb 8.7 oz (70.1 kg) 158 lb 1.1 oz (71.7 kg) 159 lb 6.3 oz (72.3 kg)    Weight change: 1 lb 5.2 oz (0.6 kg)   Hemodynamic parameters for last 24 hours:    Intake/Output from previous day: 05/21 0701 - 05/22 0700 In: 3390 [I.V.:2330; IV Piggyback:1060] Out: 2305 [Urine:2225; Chest Tube:80]  Intake/Output this shift: Total I/O In: 1520 [I.V.:1010; IV Piggyback:510] Out: 1430 [Urine:1400; Chest Tube:30]  Current Meds: Scheduled Meds: . acetaminophen  1,000 mg Oral 4 times per day   Or  . acetaminophen (TYLENOL) oral liquid 160 mg/5 mL  1,000 mg Oral 4 times per day  . antiseptic oral rinse  15 mL Mouth Rinse QID  . atorvastatin  10 mg Oral q1800  . bisacodyl  10 mg Oral Daily  . chlorhexidine  15 mL Mouth Rinse BID  . diltiazem  30 mg Oral 4 times per day  . fluticasone  2 spray Each Nare Daily  . imipenem-cilastatin  500 mg Intravenous 3 times per day  . ipratropium  0.5 mg Nebulization  Q6H  . levalbuterol  0.63 mg Nebulization Q6H  . levothyroxine  50 mcg Oral QAC breakfast  . metoprolol tartrate  25 mg Oral BID  . montelukast  10 mg Oral QHS  . pantoprazole (PROTONIX) IV  40 mg Intravenous Daily  . senna-docusate  1 tablet Oral QHS  . sertraline  100 mg Oral QHS  . sodium chloride  10-40 mL Intracatheter Q12H  . sodium chloride  3 mL Intravenous Q12H  . vancomycin  1,000 mg Intravenous Q12H   Continuous Infusions: . sodium chloride 10 mL/hr at 05/02/14 1856  . dextrose 5 % and 0.45% NaCl 100 mL/hr at 05/06/14 0409  . heparin 1,000 Units/hr (05/05/14 1900)   PRN Meds:.ALPRAZolam, ALPRAZolam, fentaNYL, HYDROmorphone (DILAUDID) injection, levalbuterol, metoprolol, ondansetron (ZOFRAN) IV, sodium chloride  General appearance: alert, fatigued and no distress Neurologic: intact Heart: regular rate and rhythm, S1, S2 normal, no murmur, click, rub or gallop Lungs: diminished breath sounds RLL Abdomen: soft, non-tender; bowel sounds normal; no masses,  no organomegaly Extremities: extremities normal, atraumatic, no cyanosis or edema, Homans sign is negative, no sign of DVT and cast rt foot Wound: no air leak  Lab Results: CBC: Recent Labs  05/05/14 0425 05/06/14 0310  WBC 22.5* 21.2*  HGB 9.9* 11.1*  HCT 29.7* 32.3*  PLT 246 240   BMET:  Recent Labs  05/05/14 2000 05/06/14 0310  NA 128* 129*  K 3.5* 4.0  CL 94* 95*  CO2 22 22  GLUCOSE 294* 111*  BUN 8 7  CREATININE 0.57 0.58  CALCIUM 5.6* 6.5*    PT/INR: No results found for this basename: LABPROT, INR,  in the last 72 hours Radiology: Dg Chest Port 1 View  05/05/2014   CLINICAL DATA:  Status post decortication of the right pleural space for empyema  EXAM: PORTABLE CHEST - 1 VIEW  COMPARISON:  DG CHEST 1V PORT dated 05/04/2014  FINDINGS: There has been interval extubation of the trachea and esophagus. Obscuration of the right heart border. Mild prominence of the cardiopericardial silhouette. The left  lung adequately inflated and clear. On the right there is persistent volume loss. Slightly increased fluid in the minor fissure is present. Two chest tubes in place and stable with tips in the upper hemithorax. Right subclavian venous catheter tip in mid portion of the SVC.  IMPRESSION: 1. Slightly increased pleural space fluid on the right with decreased aeration since yesterday's study. Two right-sided chest tubes in stable position 2. Stable prominence of the cardiac silhouette and central pulmonary vascularity. 3. Interval extubation of the trachea and esophagus.   Electronically Signed   By: David  Martinique   On: 05/05/2014 07:38     Assessment/Plan: S/P Procedure(s) (LRB): VIDEO BRONCHOSCOPY (N/A) VIDEO ASSISTED THORACOSCOPY (VATS)/EMPYEMA (Right) DECORTICATION (Right) Mobilize Diuresis Continue ABX therapy due to  preop infection Will need nutrition D/c one chest tube Primary care per CCM   [name] 05/06/2014 6:37 AM

## 2014-05-06 NOTE — Progress Notes (Signed)
Clinical Social Work Department BRIEF PSYCHOSOCIAL ASSESSMENT 05/06/2014  Patient:  [name],[name]     Account Number:  [id number]     Admit date:  04/29/2014  Clinical Social Worker:  PURITZ,LINDSAY, LCSWA  Date/Time:  05/06/2014 03:47 PM  Referred by:  Physician  Date Referred:  05/06/2014 Referred for  SNF Placement   Other Referral:   Interview type:  Patient Other interview type:    PSYCHOSOCIAL DATA Living Status:  FACILITY Admitted from facility:  [facility] Level of care:  Skilled Nursing Facility Primary support name:  Timothy Atkins Primary support relationship to patient:  CHILD, ADULT Degree of support available:   Good    CURRENT CONCERNS Current Concerns  Post-Acute Placement   Other Concerns:    SOCIAL WORK ASSESSMENT / PLAN CSW met with patient and explained role and reason for visit. Patient states she is from [facility] but states she does not want to go back there and would like to go to the [facility] if possible. CSW explained that social worker will fax her information out to other [place] facilities and a weekend social worker will provide bed offers.   Assessment/plan status:  Psychosocial Support/Ongoing Assessment of Needs Other assessment/ plan:   Information/referral to community resources:   SNF information    PATIENT'S/FAMILY'S RESPONSE TO PLAN OF CARE: Patient states that she would like to go somewhere else rather than [facility].        Lindsay Puritz, MSW, LCSWA [phone]  

## 2014-05-06 NOTE — Progress Notes (Signed)
Clinical Social Work Department CLINICAL SOCIAL WORK PLACEMENT NOTE 05/06/2014  Patient:  [name], [name]  Account Number:  [id number] [place] date:  04/29/2014  Clinical Social Worker:  Megan Salon  Date/time:  05/06/2014 03:51 PM  Clinical Social Work is seeking post-discharge placement for this patient at the following level of care:   SKILLED NURSING   (*CSW will update this form in Epic as items are completed)   05/06/2014  Patient/family provided with [place] Department of Clinical Social Work's list of facilities offering this level of care within the geographic area requested by the patient (or if unable, by the patient's family).  05/06/2014  Patient/family informed of their freedom to choose among providers that offer the needed level of care, that participate in Medicare, Medicaid or managed care program needed by the patient, have an available bed and are willing to accept the patient.  05/06/2014  Patient/family informed of [facility]' ownership interest in [facility], as well as of the fact that they are under no obligation to receive care at this facility.  PASARR submitted to EDS on 05/06/2014 PASARR number received from EDS on 05/06/2014  FL2 transmitted to all facilities in geographic area requested by pt/family on  05/06/2014 FL2 transmitted to all facilities within larger geographic area on   Patient informed that his/her managed care company has contracts with or will negotiate with  certain facilities, including the following:     Patient/family informed of bed offers received:   Patient chooses bed at  Physician recommends and patient chooses bed at    Patient to be transferred to  on   Patient to be transferred to facility by   The following physician request were entered in Epic:   Additional Comments:  Jeanette Caprice, MSW, [place]

## 2014-05-06 NOTE — Progress Notes (Signed)
Dr. Elsworth Soho and Dr. Ainsley Spinner are ok with pt travelling to radiology for [facility] with chest tube (FEES unavailable). Will plan for MBS at 10am. [name], [place] CCC-SLP [phone]

## 2014-05-06 NOTE — Progress Notes (Signed)
CRITICAL VALUE ALERT  Critical value received:  Calcium 5.6  Date of notification:  05/05/14  Time of notification:  0910  Critical value read back:yes  Nurse who received alert:  Paulla Fore  MD notified (1st page):  E-link MD Time of first page:  0913 MD notified (2nd page):  Time of second page:  Responding MD:  [place]ells Time MD responded:  [phone]

## 2014-05-07 ENCOUNTER — Inpatient Hospital Stay (HOSPITAL_COMMUNITY): Payer: PRIVATE HEALTH INSURANCE

## 2014-05-07 LAB — CBC
HCT: 29.3 % — ABNORMAL LOW (ref 36.0–46.0)
Hemoglobin: 10 g/dL — ABNORMAL LOW (ref 12.0–15.0)
MCH: 30.2 pg (ref 26.0–34.0)
MCHC: 34.1 g/dL (ref 30.0–36.0)
MCV: 88.5 fL (ref 78.0–100.0)
Platelets: 260 10*3/uL (ref 150–400)
RBC: 3.31 MIL/uL — ABNORMAL LOW (ref 3.87–5.11)
RDW: 14.4 % (ref 11.5–15.5)
WBC: 16.8 10*3/uL — ABNORMAL HIGH (ref 4.0–10.5)

## 2014-05-07 LAB — BASIC METABOLIC PANEL
BUN: 8 mg/dL (ref 6–23)
CO2: 24 mEq/L (ref 19–32)
Calcium: 6.6 mg/dL — ABNORMAL LOW (ref 8.4–10.5)
Chloride: 97 mEq/L (ref 96–112)
Creatinine, Ser: 0.55 mg/dL (ref 0.50–1.10)
GFR calc Af Amer: >90 mL/min (ref >90)
GFR calc non Af Amer: 89 mL/min — ABNORMAL LOW (ref >90)
Glucose, Bld: 132 mg/dL — ABNORMAL HIGH (ref 70–99)
Potassium: 3 mEq/L — ABNORMAL LOW (ref 3.7–5.3)
Sodium: 131 mEq/L — ABNORMAL LOW (ref 137–147)

## 2014-05-07 LAB — BODY FLUID CULTURE
Culture: NO GROWTH
Culture: NO GROWTH
Gram Stain: NONE SEEN
Gram Stain: NONE SEEN
Special Requests: 0.3

## 2014-05-07 MED ORDER — POTASSIUM CHLORIDE 10 MEQ/50ML IV SOLN
10.0000 meq | INTRAVENOUS | Status: AC
  Administered 2014-05-07 (×4): 10 meq via INTRAVENOUS
  Filled 2014-05-07: qty 50

## 2014-05-07 MED ORDER — FUROSEMIDE 40 MG PO TABS
40.0000 mg | ORAL_TABLET | Freq: Once | ORAL | Status: AC
  Administered 2014-05-07: 40 mg via ORAL
  Filled 2014-05-07: qty 1

## 2014-05-07 NOTE — Progress Notes (Signed)
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  RA office pt w/ RAD (nml spiro 2014 )  Recent adm to rehab after ankle surgery 4/16-4/20 [facility]  SIGNIFICANT EVENTS / STUDIES:  5/15 CT chest >neg for PE , RLL consolidation/mod effusion  5/16 transfer from [facility] to [facility] ICU  5/19 VATS drainange of rt empyema  LINES / TUBES: piv Right pig tail via IR 5/18>>>  CULTURES: 5/15 BC x 2 >>ng Pleural 5/19>>>ng Tissue 5/19 pleural peel >> MRSA RVP >> para influenza  ANTIBIOTICS: 5/15 Vanc  5/15 Primaxin   SUBJECTIVE:   Afebrile Voice hoarse -but improving  dyspnea -improved, pain better  VITAL SIGNS: Temp:  [97.7 F (36.5 C)-98.8 F (37.1 C)] 97.7 F (36.5 C) (05/23 0725) Pulse Rate:  [61-94] 76 (05/23 0700) Resp:  [8-26] 21 (05/23 0700) BP: (97-149)/(39-110) 109/58 mmHg (05/23 0700) SpO2:  [94 %-100 %] 100 % (05/23 0700) Weight:  [73.2 kg (161 lb 6 oz)] 73.2 kg (161 lb 6 oz) (05/23 0500) HEMODYNAMICS:   VENTILATOR SETTINGS:   Now on [place] oxygen sats OK  INTAKE / OUTPUT: Intake/Output     05/22 0701 - 05/23 0700 05/23 0701 - 05/24 0700   P.O. 200    I.V. (mL/kg) 830 (11.3)    IV Piggyback 750    Total Intake(mL/kg) 1780 (24.3)    Urine (mL/kg/hr) 2665 (1.5)    Chest Tube 40 (0)    Total Output 2705     Net -925           PHYSICAL EXAMINATION: General:  Anxious, no respiratory distress Neuro:  A/o x 3 follows commands , non focal HEENT:  Intact  Cardiovascular:  RRR , no m/r/g  Lungs: diminished bs in bases , rt chest tube -no leak Abdomen:   Soft nt , bs + Musculoskeletal:  Intact . RLE distal in cast Skin:  Intact   LABS:  CBC  Recent Labs Lab 05/05/14 0425 05/06/14 0310 05/07/14 0442  WBC 22.5* 21.2* 16.8*  HGB 9.9* 11.1* 10.0*  HCT 29.7* 32.3* 29.3*  PLT 246 240 260   Coag's  Recent Labs Lab 05/01/14 0244 05/02/14 0532  APTT 43* 37  INR  --  1.12   BMET  Recent Labs Lab 05/05/14 2000 05/06/14 0310 05/07/14 0442  NA 128* 129* 131*  K 3.5* 4.0 3.0*  CL 94* 95* 97  CO2 22 22 24   BUN 8 7 8   CREATININE 0.57 0.58 0.55  GLUCOSE 294* 111* 132*   Electrolytes  Recent Labs Lab 05/04/14 0420  05/05/14 2000 05/06/14 0310 05/07/14 0442  CALCIUM 6.5*  < > 5.6* 6.5* 6.6*  MG 1.9  --   --   --   --   PHOS 3.2  --   --   --   --   < > = values in this interval not displayed. Sepsis Markers  Recent Labs Lab 04/30/14 1707 04/30/14 1709 05/01/14 0244 05/02/14 0532  LATICACIDVEN 1.3  --   --   --  PROCALCITON  --  2.33 2.10 0.85   ABG  Recent Labs Lab 05/03/14 1658 05/04/14 0418 05/04/14 1020  PHART 7.362 7.342* 7.400  PCO2ART 55.1* 53.2* 47.4*  PO2ART 77.0* 119.0* 85.0   Liver Enzymes  Recent Labs Lab 05/01/14 0244 05/05/14 0425  AST 63* 29  ALT 45* 19  ALKPHOS 183* 103  BILITOT 0.6 0.6  ALBUMIN 3.0* 1.6*   Cardiac Enzymes  Recent Labs Lab 04/30/14 1707  PROBNP 2780.0*   Glucose  Recent Labs Lab 05/03/14 1955  GLUCAP 165*    Imaging Dg Chest Port 1 View  05/06/2014   CLINICAL DATA:  pleural effusion  EXAM: PORTABLE CHEST - 1 VIEW  COMPARISON:  May 05, 2014  FINDINGS: There are chest tubes on the right, unchanged in position. There is no demonstrable pneumothorax. Central catheter tip is at the cavoatrial junction. There is persistent consolidation involving portions of the right middle and lower lobes with small right effusion. Left lung is clear. Heart is mildly enlarged with normal pulmonary vascularity, stable. There is atherosclerotic change in aorta.   IMPRESSION: Tube and catheter positions are unchanged without pneumothorax. There is significant lung collapse on the right with right effusion. Left lung is clear. The overall appearance is stable compared to 1 day prior.   Electronically Signed   By: Lowella Grip M.D.   On: 05/06/2014 08:05   Dg Swallowing Func-speech Pathology  05/06/2014   [name], CCC-SLP     05/06/2014 11:10 AM Objective Swallowing Evaluation: Modified Barium Swallowing Study   Patient Details  Name: [name] MRN: [id number] Date of Birth: 12/11/1936  Today's Date: 05/06/2014 Time: 3536-1443 SLP Time Calculation (min): 25 min  Past Medical History:  Past Medical History  Diagnosis Date  . GERD (gastroesophageal reflux disease)   . Anxiety disorder   . Arthritis   . CAD (coronary artery disease)     Cath May 2010.  Nonbstructive  . Depression   . Hypothyroid   . Asthmatic bronchitis   . Hypertension     dr Percival Spanish  . OA (osteoarthritis)   . Chronic bronchitis   . 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 Encephalitis     d/t meningitis  . PONV (postoperative nausea and vomiting)     history of cardiac arrest day 1 post surgery  in 2008  . Esophageal motility disorder   . Atrial fibrillation   . Hyperlipidemia   . Fibromyalgia   . Vitamin D deficiency   . Status post dilation of esophageal narrowing   . Bowel obstruction     blockage   Past Surgical History:  Past Surgical History  Procedure Laterality Date  . Total knee arthroplasty Right   . Back surgery    . Coronary angioplasty with stent placement  2003  . Sinus surgery with instatrak    . Knee arthroscopy  03/26/2012    Procedure: ARTHROSCOPY KNEE;  Surgeon: Wylene Simmer, MD;   Location: [place];  Service: Orthopedics;  Laterality: Left;  with  Debridement of Lateral Meniscus tear  . Rotator cuff repair Bilateral   . Ankle fusion  08/27/2012    Procedure: ARTHRODESIS ANKLE;  Surgeon: Wylene Simmer, MD;   Location: [place];  Service: Orthopedics;   Laterality: Right;   Arthrodesis right ankle and subtalar joint  . Foot arthrodesis, subtalar Right 2013  . Removal of implant Right 03/31/2014    DR HEWITT  . Hardware revision  03/31/2014    SUBTALOR  ARTHRODESIS       DR HEWITT  . Hardware removal Right 03/31/2014    Procedure: REMOVAL OF DEEP IMPLANTS X 3  RIGHT ;  Surgeon: Wylene Simmer, MD;  Location: [place];  Service: Orthopedics;  Laterality:  Right;  . Arthrodesis tibiofibular Right 03/31/2014    Procedure: REVISION OF SUBTALOR ARTHRODESIS  RIGHT ;  Surgeon:  Wylene Simmer, MD;  Location: [place];  Service: Orthopedics;   Laterality: Right;  . Video bronchoscopy N/A 05/03/2014    Procedure: VIDEO BRONCHOSCOPY;  Surgeon: [name], MD;   Location: [facility] OR;  Service: Thoracic;  Laterality: N/A;  . Video assisted thoracoscopy (vats)/empyema Right 05/03/2014    Procedure: VIDEO ASSISTED THORACOSCOPY (VATS)/EMPYEMA;   Surgeon: [name], MD;  Location: [place];  Service:  Thoracic;  Laterality: Right;  . Decortication Right 05/03/2014    Procedure: DECORTICATION;  Surgeon: [name], MD;   Location: [place];  Service: Thoracic;  Laterality: Right;   HPI:  89 yoWF NHR remote ex smoker admitted to [facility] 5/15 for acute resp  failure for RLL PNA, pleural effusion and atrial fib with rVR.  Transfered to [facility] ICU 5/16 d/t worsening resp status. Underwent  VATS drainage of right empyema on 5/19, extubated on 5/20.  Pulmonoligst questions VCD (possible abnormal phonation prior to  intubation). History includes esophageal dilation for  hypertensive LES (Dr Olevia Perches) & is on protonix two times a day. Pt  has an MBS 01/07/14 showing normal oropharyngeal function,  probable primary esophageal dysphagia. Pt coughs unrelated to PO  intake.      Assessment / Plan / Recommendation Clinical Impression  Dysphagia Diagnosis: Mild cervical esophageal phase  dysphagia;Suspected primary esophageal dysphagia Clinical impression: Pt presents with an abnormal but functional  oral and  oropharyngeal dysphagia. Pt briefly orally holds bolus,  then transits to pharynx with timely laryngeal elevation, but  delayed hyoid excursion. Pt fully protects airway as bolus pauses  in the pharynx and then hyoid pulls open UES for transit through  cerivcal esophagus past prominent cricopharyngeus. No pentration  or aspiration occurred. Pt is recommended to consume thin liquids  and soft solids to facilitate intake and esophageal transit, also  per pts wishes. Esophageal precautions needed: follow solids with  liquids, stay upright 30-60 minutes after meals, crush large  pills in puree. SLP will f/u x1 due to mild risk associated with  decreased cough and aphonia.     Treatment Recommendation  Therapy as outlined in treatment plan below    Diet Recommendation Dysphagia 3 (Mechanical Soft);Thin liquid   Liquid Administration via: Cup;Straw Medication Administration: Crushed with puree Supervision: Staff to assist with self feeding Compensations: Slow rate;Small sips/bites;Follow solids with  liquid Postural Changes and/or Swallow Maneuvers: Seated upright 90  degrees;Upright 30-60 min after meal    Other  Recommendations Oral Care Recommendations: Oral care BID   Follow Up Recommendations  Inpatient Rehab    Frequency and Duration min 2x/week  1 week   Pertinent Vitals/Pain NA    SLP Swallow Goals     General HPI: 61 yoWF NHR remote ex smoker admitted to [facility] 5/15 for  acute resp failure for RLL PNA, pleural effusion and atrial fib  with rVR. Transfered to [facility] ICU 5/16 d/t worsening resp status.  Underwent VATS drainage of right empyema on 5/19, extubated on  5/20. Pulmonoligst questions VCD (possible abnormal phonation  prior to intubation). History includes esophageal dilation for  hypertensive LES (Dr Olevia Perches) & is on protonix two times a day. Pt  has an MBS 01/07/14 showing normal oropharyngeal function,  probable primary esophageal dysphagia. Pt coughs unrelated to PO  intake.  Type of Study: Modified Barium  Swallowing Study Reason for Referral: Objectively evaluate swallowing function Diet Prior to this Study: NPO Temperature Spikes Noted: No Respiratory Status: Nasal cannula History of Recent Intubation: Yes Length of Intubations (days): 2 days Date extubated: 05/04/14 Behavior/Cognition: Alert;Cooperative;Pleasant mood Oral Cavity - Dentition: Adequate natural dentition Oral Motor / Sensory Function: Within functional limits Self-Feeding Abilities: Able to feed self Patient Positioning: Upright in chair Baseline Vocal Quality: Aphonic Volitional Cough: Weak Volitional Swallow: Able to elicit Anatomy:  (Hypertensive UES, previously dx) Pharyngeal Secretions: Not observed secondary MBS    Reason for Referral Objectively evaluate swallowing function   Oral Phase Oral Preparation/Oral Phase Oral Phase: WFL (slight hesitation with bolus transit)   Pharyngeal Phase Pharyngeal Phase Pharyngeal Phase: Impaired Pharyngeal - Nectar Pharyngeal - Nectar Teaspoon: Delayed swallow initiation Pharyngeal - Nectar Cup: Delayed swallow initiation Pharyngeal - Thin Pharyngeal - Thin Cup: Delayed swallow initiation Pharyngeal - Thin Straw: Delayed swallow initiation Pharyngeal - Solids Pharyngeal - Puree: Delayed swallow initiation Pharyngeal - Regular: Delayed swallow initiation Pharyngeal - Pill: Delayed swallow initiation  Cervical Esophageal Phase    GO    Cervical Esophageal Phase Cervical Esophageal Phase: Impaired Cervical Esophageal Phase - Comment Cervical Esophageal Comment: Hypertensive UES, but all boluses  passed well, no residuals. Significant esophageal stasis, no  radiologist present to confirm.         [name], [place] CCC-SLP [place] Deblois 05/06/2014, 11:09 AM    CXR: 5/17 congestive heart failure with  pulmonary interstitial edema. A moderate size right pleural effusion   ASSESSMENT / PLAN:  PULMONARY A: Acute Respiratory failure, Hypoxic; Multifactorial secondary to PNA, Asthma  exacerbation, Heart Failure. /pleural effusion -empyema s/p VATS  - has component of VCD  Asthma/reactive airways  P:   - O2 to keep sat >90%   - Cont Nebs ipratropium, Xopenex.  -chest tubes per TCTS   CARDIOVASCULAR A: Atrial Fib w/ RVR >now in NSR  CHF  Duplex LE for DVT negative,CT Angio neg for PE  P:  - Cont diuresis - Off xarelto, changed to lovenox ,resume xarelto on dc - Cont metoprolol to prn  -ct cardizem PO  RENAL A:  Hyponatremia  hypokalemia P:   - BMET in AM. - Replace electrolytes as indicated. - Lasix resume  GASTROINTESTINAL A:  GERD  P:   - PPI. -dys 3 diet  HEMATOLOGIC A:   P:  - Follow H/H.  INFECTIOUS A:  RLL PNA ?MRSA empyema  P:   - - Continue Vancomycin and primaxin.    ENDOCRINE A:  Hyperglycemia   P:   - SSI.  NEUROLOGIC A:  Anxiety /Agitation / Baseline fibromylagia - on xanax, oxycodone, zoloft, ultram at baseline P:   - use fentanyl for pain prn. - Low dose xanax if needed for anxiety. - Hold oxycodone, zoloft and ultram for now. - Monitor closley.  MSK A: RLE in cast from recent surgery  PT consult, wt bearing limited  TODAY'S SUMMARY: 78 yo former smoker with RLL PNA , empyema,extubated postop but continues to have upper airway issues & deconditioning OK to transfer to SDU   I have personally obtained a history, examined the patient, evaluated laboratory and imaging results, formulated the assessment and plan and placed orders.  Kara Mead MD. Sh[place] Flood. [place] [facility] [facility] Pager [phone] If no response call [phone]

## 2014-05-07 NOTE — Plan of Care (Signed)
Problem: Phase I Progression Outcomes Goal: Activity tolerated as ordered Outcome: Not Met (add Reason) Cast on right leg; PT working with strengthening and conditioning

## 2014-05-07 NOTE — Progress Notes (Signed)
      [address]       [place] [address]             [phone]        CARDIOTHORACIC SURGERY PROGRESS NOTE   R4 Days Post-Op Procedure(s) (LRB): VIDEO BRONCHOSCOPY (N/A) VIDEO ASSISTED THORACOSCOPY (VATS)/EMPYEMA (Right) DECORTICATION (Right)  Subjective: No complaints other than foot pain and mild soreness in chest.  Objective: Vital signs: BP Readings from Last 1 Encounters:  05/07/14 109/58   Pulse Readings from Last 1 Encounters:  05/07/14 76   Resp Readings from Last 1 Encounters:  05/07/14 21   Temp Readings from Last 1 Encounters:  05/07/14 97.7 F (36.5 C) Oral    Hemodynamics:    Physical Exam:  Rhythm:   sinus  Breath sounds: coarse  Heart sounds:  RRR  Incisions:  Dressing intact  Abdomen:  soft  Extremities:  warm   Intake/Output from previous day: 05/22 0701 - 05/23 0700 In: 1780 [P.O.:200; I.V.:830; IV Piggyback:750] Out: 2705 [Urine:2665; Chest Tube:40] Intake/Output this shift: Total I/O In: 120 [I.V.:20; IV Piggyback:100] Out: 515 [Urine:515]  Lab Results:  CBC: Recent Labs  05/06/14 0310 05/07/14 0442  WBC 21.2* 16.8*  HGB 11.1* 10.0*  HCT 32.3* 29.3*  PLT 240 260    BMET:  Recent Labs  05/06/14 0310 05/07/14 0442  NA 129* 131*  K 4.0 3.0*  CL 95* 97  CO2 22 24  GLUCOSE 111* 132*  BUN 7 8  CREATININE 0.58 0.55  CALCIUM 6.5* 6.6*     CBG (last 3)  No results found for this basename: GLUCAP,  in the last 72 hours  ABG    Component Value Date/Time   PHART 7.400 05/04/2014 1020   PCO2ART 47.4* 05/04/2014 1020   PO2ART 85.0 05/04/2014 1020   HCO3 29.4* 05/04/2014 1020   TCO2 31 05/04/2014 1020   O2SAT 96.0 05/04/2014 1020    CXR: Stable, slightly improved opacity RLL  Assessment/Plan: S/P Procedure(s) (LRB): VIDEO BRONCHOSCOPY (N/A) VIDEO ASSISTED THORACOSCOPY (VATS)/EMPYEMA (Right) DECORTICATION (Right)  Stable s/p VATS for decortication Trivial chest tube output CXR appearance  slowly improving Will d/c last remaining tube Remainder of care per Pulm/CCM team  Rexene Alberts 05/07/2014 9:37 AM

## 2014-05-07 NOTE — Progress Notes (Signed)
Pt complained of pain with coughing.  Nurse contacted MD which instructed nurse to administer scheduled tylenol at this time, no additional orders. Nurse will continue to monitor

## 2014-05-07 NOTE — Progress Notes (Signed)
[facility] ADULT ICU REPLACEMENT PROTOCOL FOR AM LAB REPLACEMENT ONLY  The patient does apply for the [facility] Adult ICU Electrolyte Replacment Protocol based on the criteria listed below:   1. Is GFR >/= 40 ml/min? yes  Patient's GFR today is 89 2. Is urine output >/= 0.5 ml/kg/hr for the last 6 hours? yes Patient's UOP is 1.2 ml/kg/hr 3. Is BUN < 60 mg/dL? yes  Patient's BUN today is 8 4. Abnormal electrolyte(s): K 3.0 5. Ordered repletion with: per protocol 6. If a panic level lab has been reported, has the CCM MD in charge been notified? no.   Physician:    Billy Fischer 05/07/2014 6:01 AM

## 2014-05-08 ENCOUNTER — Inpatient Hospital Stay (HOSPITAL_COMMUNITY): Payer: PRIVATE HEALTH INSURANCE

## 2014-05-08 LAB — CULTURE, RESPIRATORY

## 2014-05-08 LAB — CULTURE, RESPIRATORY W GRAM STAIN

## 2014-05-08 LAB — BASIC METABOLIC PANEL
BUN: 8 mg/dL (ref 6–23)
CO2: 23 mEq/L (ref 19–32)
Calcium: 6.6 mg/dL — ABNORMAL LOW (ref 8.4–10.5)
Chloride: 98 mEq/L (ref 96–112)
Creatinine, Ser: 0.5 mg/dL (ref 0.50–1.10)
GFR calc Af Amer: >90 mL/min (ref >90)
GFR calc non Af Amer: >90 mL/min (ref >90)
Glucose, Bld: 87 mg/dL (ref 70–99)
Potassium: 3.6 mEq/L — ABNORMAL LOW (ref 3.7–5.3)
Sodium: 133 mEq/L — ABNORMAL LOW (ref 137–147)

## 2014-05-08 MED ORDER — METOPROLOL TARTRATE 25 MG PO TABS
25.0000 mg | ORAL_TABLET | Freq: Two times a day (BID) | ORAL | Status: DC
  Administered 2014-05-08 – 2014-05-14 (×12): 25 mg via ORAL
  Filled 2014-05-08 (×13): qty 1

## 2014-05-08 MED ORDER — GUAIFENESIN 100 MG/5ML PO SYRP
300.0000 mg | ORAL_SOLUTION | Freq: Four times a day (QID) | ORAL | Status: DC | PRN
  Administered 2014-05-08 – 2014-05-10 (×5): 300 mg via ORAL
  Filled 2014-05-08 (×7): qty 15

## 2014-05-08 MED ORDER — GUAIFENESIN ER 600 MG PO TB12
600.0000 mg | ORAL_TABLET | Freq: Two times a day (BID) | ORAL | Status: DC | PRN
  Filled 2014-05-08: qty 1

## 2014-05-08 NOTE — Progress Notes (Addendum)
      [address]       [place] [address]             [phone]      5 Days Post-Op Procedure(s) (LRB): VIDEO BRONCHOSCOPY (N/A) VIDEO ASSISTED THORACOSCOPY (VATS)/EMPYEMA (Right) DECORTICATION (Right)  Subjective:  [name] continues to have pain in her left foot.  Chest discomfort improved after removal of chest tube  Objective: Vital signs in last 24 hours: Temp:  [97.5 F (36.4 C)-98.4 F (36.9 C)] 98.2 F (36.8 C) (05/24 0847) Pulse Rate:  [73-90] 86 (05/24 0847) Cardiac Rhythm:  [-] Normal sinus rhythm (05/24 0800) Resp:  [16-21] 19 (05/24 0847) BP: (105-140)/(47-87) 131/52 mmHg (05/24 0847) SpO2:  [94 %-100 %] 95 % (05/24 0847) Weight:  [164 lb 14.5 oz (74.8 kg)] 164 lb 14.5 oz (74.8 kg) (05/24 0500)  Intake/Output from previous day: 05/23 0701 - 05/24 0700 In: 1829 [P.O.:840; I.V.:20; IV Piggyback:850] Out: 1920 [HBZJI:9678]  General appearance: alert, cooperative and no distress Heart: regular rate and rhythm Lungs: coarse bilateral Abdomen: soft, non-tender; bowel sounds normal; no masses,  no organomegaly Wound: clean and dry  Lab Results:  Recent Labs  05/06/14 0310 05/07/14 0442  WBC 21.2* 16.8*  HGB 11.1* 10.0*  HCT 32.3* 29.3*  PLT 240 260   BMET:  Recent Labs  05/07/14 0442 05/08/14 0615  NA 131* 133*  K 3.0* 3.6*  CL 97 98  CO2 24 23  GLUCOSE 132* 87  BUN 8 8  CREATININE 0.55 0.50  CALCIUM 6.6* 6.6*    PT/INR: No results found for this basename: LABPROT, INR,  in the last 72 hours ABG    Component Value Date/Time   PHART 7.400 05/04/2014 1020   HCO3 29.4* 05/04/2014 1020   TCO2 31 05/04/2014 1020   O2SAT 96.0 05/04/2014 1020   CBG (last 3)  No results found for this basename: GLUCAP,  in the last 72 hours  Assessment/Plan: S/P Procedure(s) (LRB): VIDEO BRONCHOSCOPY (N/A) VIDEO ASSISTED THORACOSCOPY (VATS)/EMPYEMA (Right) DECORTICATION (Right)  1. Chest tube removed yesterday- CXR shows no  pneumothorax, continued pleural effusion/thickening stable 2. Care per CCM, will follow intermittently   LOS: 9 days    [name] 05/08/2014  I have seen and examined the patient and agree with the assessment and plan as outlined.  Rexene Alberts 05/08/2014 9:59 AM

## 2014-05-08 NOTE — Progress Notes (Signed)
PULMONARY / CRITICAL CARE MEDICINE   Name: [name] MRN: [id number] DOB: 03/03/1936    ADMISSION DATE:  04/29/2014 CONSULTATION DATE:  5/16   REFERRING MD :  AP TRH  PRIMARY SERVICE:  PCCM   CHIEF COMPLAINT:  RLL PNA/Effusion [name]   BRIEF PATIENT DESCRIPTION:  78 yoWF NHR remote ex smoker admitted to [facility] 5/15 for acute resp failure for RLL PNA , pleural effusion and atrial fib with rVR .Transfered to [facility] ICU 5/16 d/t worsening resp status , PCCM to admit .  RA office pt w/ RAD (nml spiro 2014 )  Recent adm to rehab after ankle surgery 4/16-4/20 [facility]  SIGNIFICANT EVENTS / STUDIES:  5/15 CT chest >neg for PE , RLL consolidation/mod effusion  5/16 transfer [facility] ICU  5/19 VATS drainange of rt empyema  LINES / TUBES: piv Right pig tail via IR 5/18>>>  CULTURES: 5/15 BC x 2 >>ng Pleural 5/19>>>ng Tissue 5/19 pleural peel >> MRSA RVP >> para influenza  ANTIBIOTICS: 5/15 Vanc  5/15 Primaxin   SUBJECTIVE:  5/24 Bloody mucus hemoptysis on lovenox. Chest tube removed yesterday. Afebrile Voice hoarse -but improving  dyspnea -improved, pain better  VITAL SIGNS: Temp:  [97.5 F (36.4 C)-98.4 F (36.9 C)] 98.2 F (36.8 C) (05/24 0847) Pulse Rate:  [73-90] 86 (05/24 0847) Resp:  [16-21] 19 (05/24 0847) BP: (105-140)/(47-87) 131/52 mmHg (05/24 0847) SpO2:  [94 %-100 %] 95 % (05/24 0847) Weight:  [164 lb 14.5 oz (74.8 kg)] 164 lb 14.5 oz (74.8 kg) (05/24 0500) HEMODYNAMICS:   VENTILATOR SETTINGS:   Now on [place] oxygen sats OK  INTAKE / OUTPUT: Intake/Output     05/23 0701 - 05/24 0700 05/24 0701 - 05/25 0700   P.O. 840 120   I.V. (mL/kg) 20 (0.3)    IV Piggyback 850    Total Intake(mL/kg) 1710 (22.9) 120 (1.6)   Urine (mL/kg/hr) 1920 (1.1)    Chest Tube     Total Output 1920     Net -210 +120        Stool Occurrence  1 x    PHYSICAL EXAMINATION: General:  Anxious, no respiratory distress. Asleep and snoring when came in, woke easily Neuro:   A/o x 3 follows commands , non focal HEENT:  Intact  Cardiovascular:  RRR , no m/r/g  Lungs: diminished bs in bases , R chest tube site dressing is dry Abdomen:  Soft nt , bs + Musculoskeletal:  Intact . RLE distal in cast Skin:  Intact   LABS:  CBC  Recent Labs Lab 05/05/14 0425 05/06/14 0310 05/07/14 0442  WBC 22.5* 21.2* 16.8*  HGB 9.9* 11.1* 10.0*  HCT 29.7* 32.3* 29.3*  PLT 246 240 260   Coag's  Recent Labs Lab 05/02/14 0532  APTT 37  INR 1.12   BMET  Recent Labs Lab 05/06/14 0310 05/07/14 0442 05/08/14 0615  NA 129* 131* 133*  K 4.0 3.0* 3.6*  CL 95* 97 98  CO2 22 24 23   BUN 7 8 8   CREATININE 0.58 0.55 0.50  GLUCOSE 111* 132* 87   Electrolytes  Recent Labs Lab 05/04/14 0420  05/06/14 0310 05/07/14 0442 05/08/14 0615  CALCIUM 6.5*  < > 6.5* 6.6* 6.6*  MG 1.9  --   --   --   --   PHOS 3.2  --   --   --   --   < > = values in this interval not displayed. Sepsis Markers  Recent Labs Lab 05/02/14 0532  PROCALCITON 0.85   ABG  Recent Labs Lab 05/03/14 1658 05/04/14 0418 05/04/14 1020  PHART 7.362 7.342* 7.400  PCO2ART 55.1* 53.2* 47.4*  PO2ART 77.0* 119.0* 85.0   Liver Enzymes  Recent Labs Lab 05/05/14 0425  AST 29  ALT 19  ALKPHOS 103  BILITOT 0.6  ALBUMIN 1.6*   Cardiac Enzymes No results found for this basename: TROPONINI, PROBNP,  in the last 168 hours Glucose  Recent Labs Lab 05/03/14 1955  GLUCAP 165*    Imaging Dg Chest Port 1 View  05/07/2014   CLINICAL DATA:  Postoperative check status post VATS  EXAM: PORTABLE CHEST - 1 VIEW  COMPARISON:  Prior chest x-ray 05/06/2014  FINDINGS: Stable position of typically directed right chest tube. The second chest tube has been removed. Improved inspiratory volumes with decreasing atelectasis of the right lung. Right subclavian approach central venous catheter is unchanged with the tip at the superior cavoatrial junction. No evidence of a pneumothorax. Small residual right  pleural effusion versus pleural thickening. Right basilar atelectasis persists. Stable cardiac and mediastinal contours. Aortic atherosclerosis. The left lung remains well area. No acute osseous abnormality.  IMPRESSION: 1. Interval removal of 1 of the 2 right-sided chest tubes. No visible pneumothorax. 2. Other support apparatus remain in stable and satisfactory position. 3. Improved inspiratory volumes and aeration in the right upper and mid lung. 4. Persistent small right pleural effusion versus pleural thickening and right basilar opacity.   Electronically Signed   By: Jacqulynn Cadet M.D.   On: 05/07/2014 09:54   CXR: Persistent opacification R lower  ASSESSMENT / PLAN:  PULMONARY A: Acute Respiratory failure, Hypoxic; Multifactorial secondary to PNA, Asthma exacerbation, Heart Failure. /pleural effusion -empyema s/p VATS  - has component of VCD  Asthma/reactive airways Hemoptysis on lovenox after removal of chest tube.  P:   - O2 to keep sat >90%   - Cont Nebs ipratropium, Xopenex.  -chest tubes per TCTS -watch for further heme   CARDIOVASCULAR A: Atrial Fib w/ RVR >now in NSR  CHF  Duplex LE for DVT negative,CT Angio neg for PE  P:  - Cont diuresis - Off xarelto, changed to lovenox ,resume xarelto on dc - Cont metoprolol to prn  -ct cardizem PO  RENAL A:  Hyponatremia  hypokalemia P:   - BMET in AM. - Replace electrolytes as indicated. - Lasix resume  GASTROINTESTINAL A:  GERD  P:   - PPI. -dys 3 diet  HEMATOLOGIC A:   P:  - Follow H/H.  INFECTIOUS A:  RLL PNA ?MRSA empyema  P:   - - Continue Vancomycin and primaxin.    ENDOCRINE A:  Hyperglycemia   P:   - SSI.  NEUROLOGIC A:  Anxiety /Agitation / Baseline fibromylagia - on xanax, oxycodone, zoloft, ultram at baseline P:   - use fentanyl for pain prn. - Low dose xanax if needed for anxiety. - Hold oxycodone, zoloft and ultram for now. - Monitor closley.  MSK A: RLE in cast from recent  surgery  PT consult, wt bearing limited  TODAY'S SUMMARY: 78 yo former smoker with RLL PNA , empyema,extubated postop but continues to have upper airway issues & deconditioning OK to transfer to SDU     CD Annamaria Boots, MD  FCCP. [facility] Pager [phone]   M [phone] If no response call [phone]

## 2014-05-08 NOTE — Progress Notes (Signed)
Nurse initial assessment; Pt c/o SOB, labored breathing, expiratory wheezes.  Nurse requested respiratory to assess.  Respiratory administered breathing treatment at 0800.  Nurse paged MD.  Nurse will continue to monitor

## 2014-05-09 DIAGNOSIS — E039 Hypothyroidism, unspecified: Secondary | ICD-10-CM

## 2014-05-09 DIAGNOSIS — J45909 Unspecified asthma, uncomplicated: Secondary | ICD-10-CM

## 2014-05-09 LAB — GLUCOSE, CAPILLARY: Glucose-Capillary: 100 mg/dL — ABNORMAL HIGH (ref 70–99)

## 2014-05-09 MED ORDER — PANTOPRAZOLE SODIUM 40 MG PO TBEC
40.0000 mg | DELAYED_RELEASE_TABLET | Freq: Two times a day (BID) | ORAL | Status: DC
  Administered 2014-05-09 – 2014-05-14 (×11): 40 mg via ORAL
  Filled 2014-05-09 (×11): qty 1

## 2014-05-09 MED ORDER — BENZOCAINE 10 % MT GEL
Freq: Four times a day (QID) | OROMUCOSAL | Status: DC | PRN
  Administered 2014-05-09: 1 via OROMUCOSAL
  Administered 2014-05-10: 10:00:00 via OROMUCOSAL
  Administered 2014-05-10: 1 via OROMUCOSAL
  Filled 2014-05-09: qty 9.4

## 2014-05-09 MED ORDER — GELCLAIR MT GEL
1.0000 | OROMUCOSAL | Status: DC | PRN
  Filled 2014-05-09: qty 1

## 2014-05-09 NOTE — Progress Notes (Signed)
PULMONARY / CRITICAL CARE MEDICINE   Name: [name] MRN: [id number] DOB: 07/08/1936    ADMISSION DATE:  04/29/2014 CONSULTATION DATE:  5/16   REFERRING MD :  AP TRH  PRIMARY SERVICE:  PCCM   CHIEF COMPLAINT:  RLL PNA/Effusion [name]   BRIEF PATIENT DESCRIPTION:  19 yoWF NHR remote ex smoker admitted [facility] for acute resp failure for RLL PNA , pleural effusion and atrial fib with rVR .Transfered to [facility] ICU 5/16 d/t worsening resp status , PCCM   Adm   RA office pt w/ RAD (nml spiro 2014 )  Recent adm to rehab after ankle surgery 4/16-4/20 [facility]  SIGNIFICANT EVENTS / STUDIES:  5/15 CT chest >neg for PE , RLL consolidation/mod effusion  5/16 transfer from [facility] to [facility] ICU  5/19 VATS drainange of rt empyema  LINES / TUBES:  Right pig tail via IR 5/18/ VATS with CT chest tube 5/19 >>> all tubes out  5/23   CULTURES: 5/15 BC x 2 >>ng RVP 5/16 >> para influenza Pleural 5/19>>>ng Tissue 5/19 pleural peel >> MRSA Bronchial Washings 5/19> MSSA     ANTIBIOTICS: 5/15 Vanc >>> 5/15 Primaxin > 5/25  SUBJECTIVE:    NAD but quite hoarse/ barking upper airway cough, using cough drops and not flutter  VITAL SIGNS: Temp:  [98 F (36.7 C)-98.6 F (37 C)] 98 F (36.7 C) (05/25 0800) Pulse Rate:  [75-92] 86 (05/25 0800) Resp:  [13-24] 16 (05/25 0800) BP: (113-132)/(45-79) 116/49 mmHg (05/25 0800) SpO2:  [93 %-100 %] 100 % (05/25 0842) Weight:  [160 lb 11.5 oz (72.9 kg)] 160 lb 11.5 oz (72.9 kg) (05/25 0414) FIO2:  2lpm NP    HEMODYNAMICS:   VENTILATOR SETTINGS:   Now on [place] oxygen sats OK  INTAKE / OUTPUT:  Intake/Output Summary (Last 24 hours) at 05/09/14 0936 Last data filed at 05/09/14 0421  Gross per 24 hour  Intake    250 ml  Output   1450 ml  Net  -1200 ml     PHYSICAL EXAMINATION: General:  Anxious, no respiratory distress.   Neuro:  A/o x 3   HEENT:  Intact  Cardiovascular:  RRR , no m/r/g  Lungs: diminished bs in bases , R chest tube site  dressing is dry/ marked pseudowheeze Abdomen:  Soft nt , bs + Musculoskeletal:  Intact . RLE distal in cast Skin:  Intact   LABS:  CBC  Recent Labs Lab 05/05/14 0425 05/06/14 0310 05/07/14 0442  WBC 22.5* 21.2* 16.8*  HGB 9.9* 11.1* 10.0*  HCT 29.7* 32.3* 29.3*  PLT 246 240 260   Coag's No results found for this basename: APTT, INR,  in the last 168 hours BMET  Recent Labs Lab 05/06/14 0310 05/07/14 0442 05/08/14 0615  NA 129* 131* 133*  K 4.0 3.0* 3.6*  CL 95* 97 98  CO2 22 24 23   BUN 7 8 8   CREATININE 0.58 0.55 0.50  GLUCOSE 111* 132* 87   Electrolytes  Recent Labs Lab 05/04/14 0420  05/06/14 0310 05/07/14 0442 05/08/14 0615  CALCIUM 6.5*  < > 6.5* 6.6* 6.6*  MG 1.9  --   --   --   --   PHOS 3.2  --   --   --   --   < > = values in this interval not displayed. Sepsis Markers No results found for this basename: LATICACIDVEN, PROCALCITON, O2SATVEN,  in the last 168 hours ABG  Recent Labs Lab  05/03/14 1658 05/04/14 0418 05/04/14 1020  PHART 7.362 7.342* 7.400  PCO2ART 55.1* 53.2* 47.4*  PO2ART 77.0* 119.0* 85.0   Liver Enzymes  Recent Labs Lab 05/05/14 0425  AST 29  ALT 19  ALKPHOS 103  BILITOT 0.6  ALBUMIN 1.6*   Cardiac Enzymes No results found for this basename: TROPONINI, PROBNP,  in the last 168 hours Glucose  Recent Labs Lab 05/03/14 1955  GLUCAP 165*    Imaging Dg Chest Port 1 View  05/08/2014   CLINICAL DATA:  Follow up empyema.  Right thoracostomy tube removal.  EXAM: PORTABLE CHEST - 1 VIEW  COMPARISON:  05/07/2014 and prior radiographs  FINDINGS: A right thoracostomy tube has been removed. There is no evidence of pneumothorax.  Right mid lower lung airspace disease/ atelectasis and small right pleural effusion versus pleural thickening again noted.  A right subclavian central venous catheter is again noted with tip overlying cavoatrial junction.  The left lung is clear.  Cardiomediastinal silhouette is stable.  IMPRESSION:  Right thoracostomy tube removal without pneumothorax. Otherwise unchanged chest radiograph.   Electronically Signed   By: Hassan Rowan M.D.   On: 05/08/2014 12:03      ASSESSMENT / PLAN:  PULMONARY A: Acute Respiratory failure, Hypoxic; Multifactorial secondary to PNA, Asthma exacerbation, Heart Failure. /pleural effusion -empyema s/p VATS 5/19   - has component of VCD  Asthma/reactive airways Hemoptysis on lovenox after removal of chest tube.  P:   - O2 to keep sat >90%   - Cont Nebs ipratropium, Xopenex.  - max rx for GERD added 5/25 and advised no mint/menthol products   CARDIOVASCULAR A: Atrial Fib w/ RVR >now in NSR  CHF  Duplex LE for DVT negative,CT Angio neg for PE  P:  - Cont diuresis -  lovenox ,resume xarelto on dc - Cont metoprolol  prn  -ct cardizem PO  RENAL A:  Hyponatremia  hypokalemia P:    - Replace electrolytes as indicated.    GASTROINTESTINAL A:  GERD  P:   - PPI. -dys 3 diet  HEMATOLOGIC A:   P:  - Follow H/H.  INFECTIOUS A:  RLL PNA MRSA empyema  P:   - - abx per dashboard/ dc'd primaxin 5/25 as no indication    ENDOCRINE A:  Hyperglycemia  Hypothyroidism Lab Results  Component Value Date   TSH 0.521 04/30/2014     P:   - SSI.  NEUROLOGIC A:  Anxiety /Agitation / Baseline fibromylagia - on xanax, oxycodone, zoloft, ultram at baseline P:   - use fentanyl for pain prn. - Low dose xanax if needed for anxiety. - Holding oxycodone, zoloft and ultram for now.    MSK A: RLE in cast from recent surgery  PT consult, wt bearing limited        Christinia Gully, MD Pulmonary and [place] [phone] After 5:30 PM or weekends, call [phone]

## 2014-05-09 NOTE — Progress Notes (Signed)
ANTICOAGULATION / Antibiotic CONSULT NOTE   Pharmacy Consult for Vancomycin/Lovenox  Indication: MRSA infection s/p VATS (pleual tissue)/afib   Allergies  Allergen Reactions  . Aspirin Other (See Comments)    REACTION: regular strength causes "heart to beat fast"  . Captopril Hypertension  . Naproxen Other (See Comments)    Tongue swelling  . Penicillins     Swelling around site  . Sulfonamide Derivatives Hives  . Clindamycin/Lincomycin     Labs:  Recent Labs  05/07/14 0442 05/08/14 0615  HGB 10.0*  --   HCT 29.3*  --   PLT 260  --   CREATININE 0.55 0.50    Estimated Creatinine Clearance: 52 ml/min (by C-G formula based on Cr of 0.5).  Assessment: 78 yo female with PNA/pleural effusion, on Xarelto PTA- last dose 5/15. Heparin discontinued after blood found in chest post thoracentesis 5/17. Pt also had R chest drain placed 5/18.  Now s/p VATS with pleural tissue culture positive for MRSA. Patient also on lovenox 70mg  sq every 12 hours   ID: HCAP/Empyema s/p VATS, afebrile, WBC = 21.2>16.8  CXR showing no pneumothorax, cont pleural effusion stable  Vanc 5/15>>  5/18 VT= 13 5/21 VT = 18.9 (therapeutic) ==> continue vancomycin 1g/12  Renal function has been stable since last trough, patient remains afebrile   Goal of Therapy:  Monitor platelets by anticoagulation protocol: Yes Vancomycin trough = 15 to 20 mcg / dl   Plan:  Continue Lovenox 70mg  q12h (to resume xarelto on dc) Continue Vancomycin 1 Gram iv Q 12h Given the fact that the patient's renal function remains stable and patient remains afebrile, will evaluate whether or not to draw trough tomorrow with CBC q72h  Joe Gee B. Leitha Schuller, PharmD Clinical Pharmacist - Resident Phone: [phone] Pager: [phone] 05/09/2014 1:33 PM

## 2014-05-09 NOTE — Clinical Social Work Note (Signed)
Pt transferred from 2S.  Report received.  2C CSW now following.  Nonnie Done, [place] [phone]  Clinical Social Work

## 2014-05-09 NOTE — Progress Notes (Addendum)
      [name]       [place] [address]             [phone]      6 Days Post-Op Procedure(s) (LRB): VIDEO BRONCHOSCOPY (N/A) VIDEO ASSISTED THORACOSCOPY (VATS)/EMPYEMA (Right) DECORTICATION (Right)  Subjective:  [name] is feeling better this morning.  She has not had much hemoptysis after cessation of Lovenox.  Objective: Vital signs in last 24 hours: Temp:  [98 F (36.7 C)-98.6 F (37 C)] 98 F (36.7 C) (05/25 0800) Pulse Rate:  [75-92] 86 (05/25 0800) Cardiac Rhythm:  [-] Normal sinus rhythm (05/24 1931) Resp:  [13-24] 16 (05/25 0800) BP: (113-132)/(45-79) 116/49 mmHg (05/25 0800) SpO2:  [93 %-100 %] 100 % (05/25 0842) Weight:  [160 lb 11.5 oz (72.9 kg)] 160 lb 11.5 oz (72.9 kg) (05/25 0414)  Intake/Output from previous day: 05/24 0701 - 05/25 0700 In: 370 [P.O.:240; I.V.:130] Out: 1450 [Urine:1450]  General appearance: alert, cooperative and no distress Heart: regular rate and rhythm Lungs: coarse Wound: clean and dry  Lab Results:  Recent Labs  05/07/14 0442  WBC 16.8*  HGB 10.0*  HCT 29.3*  PLT 260   BMET:  Recent Labs  05/07/14 0442 05/08/14 0615  NA 131* 133*  K 3.0* 3.6*  CL 97 98  CO2 24 23  GLUCOSE 132* 87  BUN 8 8  CREATININE 0.55 0.50  CALCIUM 6.6* 6.6*    PT/INR: No results found for this basename: LABPROT, INR,  in the last 72 hours ABG    Component Value Date/Time   PHART 7.400 05/04/2014 1020   HCO3 29.4* 05/04/2014 1020   TCO2 31 05/04/2014 1020   O2SAT 96.0 05/04/2014 1020   CBG (last 3)  No results found for this basename: GLUCAP,  in the last 72 hours  Assessment/Plan: S/P Procedure(s) (LRB): VIDEO BRONCHOSCOPY (N/A) VIDEO ASSISTED THORACOSCOPY (VATS)/EMPYEMA (Right) DECORTICATION (Right)  1. VATS- incision clean and dry 2. MRSA Empyema- continue ABX  3. Dispo- care per primary   LOS: 10 days    Ellwood Handler 05/09/2014  I have seen and examined the patient and agree with the  assessment and plan as outlined.  Rexene Alberts 05/09/2014 10:02 AM

## 2014-05-10 LAB — CBC
HCT: 27.7 % — ABNORMAL LOW (ref 36.0–46.0)
Hemoglobin: 9.8 g/dL — ABNORMAL LOW (ref 12.0–15.0)
MCH: 31.1 pg (ref 26.0–34.0)
MCHC: 35.4 g/dL (ref 30.0–36.0)
MCV: 87.9 fL (ref 78.0–100.0)
Platelets: 326 10*3/uL (ref 150–400)
RBC: 3.15 MIL/uL — ABNORMAL LOW (ref 3.87–5.11)
RDW: 14.7 % (ref 11.5–15.5)
WBC: 20 10*3/uL — ABNORMAL HIGH (ref 4.0–10.5)

## 2014-05-10 LAB — VANCOMYCIN, TROUGH: Vancomycin Tr: 22.9 ug/mL — ABNORMAL HIGH (ref 10.0–20.0)

## 2014-05-10 MED ORDER — SODIUM CHLORIDE 0.9 % IJ SOLN
10.0000 mL | INTRAMUSCULAR | Status: DC | PRN
  Administered 2014-05-10 – 2014-05-11 (×4): 10 mL

## 2014-05-10 MED ORDER — WHITE PETROLATUM GEL
Status: AC
  Administered 2014-05-10: 0.2
  Filled 2014-05-10: qty 5

## 2014-05-10 MED ORDER — VANCOMYCIN HCL IN DEXTROSE 750-5 MG/150ML-% IV SOLN
750.0000 mg | Freq: Two times a day (BID) | INTRAVENOUS | Status: AC
  Administered 2014-05-11 – 2014-05-12 (×3): 750 mg via INTRAVENOUS
  Filled 2014-05-10 (×5): qty 150

## 2014-05-10 NOTE — Progress Notes (Signed)
PULMONARY / CRITICAL CARE MEDICINE   Name: [name] MRN: [id number] DOB: 01/19/1936    ADMISSION DATE:  04/29/2014 CONSULTATION DATE:  5/16   REFERRING MD :  [facility]  PRIMARY SERVICE:  PCCM   CHIEF COMPLAINT:  RLL PNA/Effusion [name]   BRIEF PATIENT DESCRIPTION:  76 yoWF NHR remote ex smoker admitted to [facility] 5/15 for acute resp failure for RLL PNA , pleural effusion and atrial fib with rVR .Transfered to [facility] ICU 5/16 d/t worsening resp status , PCCM   Adm   RA office pt w/ RAD (nml spiro 2014 )  Recent adm to rehab after ankle surgery 4/16-4/20 [facility]  SIGNIFICANT EVENTS / STUDIES:  5/15 CT chest >neg for PE , RLL consolidation/mod effusion  5/16 transfer from [facility] to [facility] ICU  5/19 VATS drainange of rt empyema  LINES / TUBES:  Right pig tail via IR 5/18/ VATS with CT chest tube 5/19 >>> all tubes out  5/23   CULTURES: 5/15 BC x 2 >>ng RVP 5/16 >> para influenza Pleural 5/19>>>ng Tissue 5/19 pleural peel >> MRSA Bronchial Washings 5/19> MSSA     ANTIBIOTICS: 5/15 Vanc >>> 5/15 Primaxin > 5/25  SUBJECTIVE:    Voice improved but remains hoarse/ upper airway cough afebrile  VITAL SIGNS: Temp:  [98 F (36.7 C)-99.1 F (37.3 C)] 98 F (36.7 C) (05/26 0721) Pulse Rate:  [72-135] 88 (05/26 0721) Resp:  [17-23] 20 (05/26 0721) BP: (108-140)/(41-72) 115/64 mmHg (05/26 0829) SpO2:  [96 %-100 %] 98 % (05/26 0906) Weight:  [72.5 kg (159 lb 13.3 oz)] 72.5 kg (159 lb 13.3 oz) (05/26 0402) FIO2:  2lpm NP    HEMODYNAMICS:   VENTILATOR SETTINGS:   Now on [place] oxygen sats OK  INTAKE / OUTPUT:  Intake/Output Summary (Last 24 hours) at 05/10/14 1121 Last data filed at 05/10/14 0700  Gross per 24 hour  Intake    480 ml  Output    950 ml  Net   -470 ml     PHYSICAL EXAMINATION: General:  Anxious, no respiratory distress.   Neuro:  A/o x 3   HEENT:  Intact  Cardiovascular:  RRR , no m/r/g  Lungs: diminished bs in bases , R chest tube site dressing is dry/  some pseudowheeze Abdomen:  Soft nt , bs + Musculoskeletal:  Intact . RLE distal in cast Skin:  Intact   LABS:  CBC  Recent Labs Lab 05/06/14 0310 05/07/14 0442 05/10/14 0430  WBC 21.2* 16.8* 20.0*  HGB 11.1* 10.0* 9.8*  HCT 32.3* 29.3* 27.7*  PLT 240 260 326   Coag's No results found for this basename: APTT, INR,  in the last 168 hours BMET  Recent Labs Lab 05/06/14 0310 05/07/14 0442 05/08/14 0615  NA 129* 131* 133*  K 4.0 3.0* 3.6*  CL 95* 97 98  CO2 22 24 23   BUN 7 8 8   CREATININE 0.58 0.55 0.50  GLUCOSE 111* 132* 87   Electrolytes  Recent Labs Lab 05/04/14 0420  05/06/14 0310 05/07/14 0442 05/08/14 0615  CALCIUM 6.5*  < > 6.5* 6.6* 6.6*  MG 1.9  --   --   --   --   PHOS 3.2  --   --   --   --   < > = values in this interval not displayed. Sepsis Markers No results found for this basename: LATICACIDVEN, PROCALCITON, O2SATVEN,  in the last 168 hours ABG  Recent Labs Lab 05/03/14 1658 05/04/14  0418 05/04/14 1020  PHART 7.362 7.342* 7.400  PCO2ART 55.1* 53.2* 47.4*  PO2ART 77.0* 119.0* 85.0   Liver Enzymes  Recent Labs Lab 05/05/14 0425  AST 29  ALT 19  ALKPHOS 103  BILITOT 0.6  ALBUMIN 1.6*   Cardiac Enzymes No results found for this basename: TROPONINI, PROBNP,  in the last 168 hours Glucose  Recent Labs Lab 05/03/14 1955 05/09/14 0844  GLUCAP 165* 100*    Imaging No results found.    ASSESSMENT / PLAN:  PULMONARY A: Acute Respiratory failure, Hypoxic; Multifactorial secondary to PNA, Asthma exacerbation, Heart Failure. /pleural effusion -empyema s/p VATS 5/19   - has component of VCD  Asthma/reactive airways Hemoptysis on lovenox after removal of chest tube.  P:   - O2 to keep sat >90%   - Cont Nebs ipratropium, Xopenex.  - max rx for GERD added 5/25 and advised no mint/menthol products -ENT input-has seen Constance Holster in the past   CARDIOVASCULAR A: Atrial Fib w/ RVR >now in NSR  CHF  Duplex LE for DVT negative,CT  Angio neg for PE  P:  - Cont diuresis -  lovenox ,resume xarelto on dc - Cont metoprolol  prn  -ct cardizem PO  RENAL A:  Hyponatremia  hypokalemia P:    - Replace electrolytes as indicated.    GASTROINTESTINAL A:  GERD  P:   - PPI. -dys 3 diet  HEMATOLOGIC A:  Leucocytosis P:  - Follow H/H.  INFECTIOUS A:  RLL PNA MRSA empyema  P:   - - abx per dashboard/ dc'd primaxin 5/25 as no indication  -vanc x 14 ds?   ENDOCRINE A:  Hyperglycemia  Hypothyroidism Lab Results  Component Value Date   TSH 0.521 04/30/2014     P:   - SSI.  NEUROLOGIC A:  Anxiety /Agitation / Baseline fibromylagia - on xanax, oxycodone, zoloft, ultram at baseline P:   - use fentanyl for pain prn. - Low dose xanax if needed for anxiety. - Holding oxycodone, zoloft and ultram for now.    MSK A: RLE in cast from recent surgery  PT consult, wt bearing limited    Consider dc back to rehab facility, Pt does not want to go back to [place] Transfer to floor  Kara Mead MD. FCCP. [facility] Pager [phone] If no response call [phone]

## 2014-05-10 NOTE — Progress Notes (Signed)
ANTIBIOTIC CONSULT NOTE - FOLLOW UP  Pharmacy Consult for vancomycin Indication: MRSA empyema  Allergies  Allergen Reactions  . Aspirin Other (See Comments)    REACTION: regular strength causes "heart to beat fast"  . Captopril Hypertension  . Naproxen Other (See Comments)    Tongue swelling  . Penicillins     Swelling around site  . Sulfonamide Derivatives Hives  . Clindamycin/Lincomycin     Patient Measurements: Height: 4\' 11"  (149.9 cm) Weight: 159 lb 13.3 oz (72.5 kg) IBW/kg (Calculated) : 43.2 Vital Signs: Temp: 98.3 F (36.8 C) (05/26 1313) Temp src: Oral (05/26 1313) BP: 134/65 mmHg (05/26 1313) Pulse Rate: 75 (05/26 1313) Intake/Output from previous day: 05/25 0701 - 05/26 0700 In: 528 [P.O.:120; I.V.:208; IV Piggyback:200] Out: 1075 [Urine:1075] Intake/Output from this shift: Total I/O In: 120 [P.O.:120] Out: 250 [Urine:250]  Labs:  Recent Labs  05/08/14 0615 05/10/14 0430  WBC  --  20.0*  HGB  --  9.8*  PLT  --  326  CREATININE 0.50  --    Estimated Creatinine Clearance: 51.9 ml/min (by C-G formula based on Cr of 0.5).  Recent Labs  05/10/14 1330  VANCOTROUGH 22.9*     Microbiology: 5/19 pleural tissue> MRSA (on pleural peel) 5/19 pleural fluid> NEG 5/19 resp cx> rare MSSA  5/15 bld >> NGTD (final) 5/16 MRSA pcr pos 5/16 RVP >> parainfluenza 3  Anti-infectives   Start     Dose/Rate Route Frequency Ordered Stop   05/11/14 2000  vancomycin (VANCOCIN) IVPB 750 mg/150 ml premix     750 mg 150 mL/hr over 60 Minutes Intravenous Every 12 hours 05/10/14 1503     05/04/14 2200  imipenem-cilastatin (PRIMAXIN) 500 mg in sodium chloride 0.9 % 100 mL IVPB  Status:  Discontinued     500 mg 200 mL/hr over 30 Minutes Intravenous 3 times per day 05/04/14 1410 05/09/14 0935   05/03/14 1645  vancomycin (VANCOCIN) IVPB 1000 mg/200 mL premix  Status:  Discontinued     1,000 mg 200 mL/hr over 60 Minutes Intravenous Every 12 hours 05/03/14 1633 05/03/14  1638   05/02/14 1400  vancomycin (VANCOCIN) IVPB 1000 mg/200 mL premix  Status:  Discontinued     1,000 mg 200 mL/hr over 60 Minutes Intravenous Every 12 hours 05/02/14 1234 05/10/14 1503   04/30/14 2300  vancomycin (VANCOCIN) IVPB 750 mg/150 ml premix  Status:  Discontinued     750 mg 150 mL/hr over 60 Minutes Intravenous Every 12 hours 04/30/14 0931 05/02/14 1234   04/30/14 1200  piperacillin-tazobactam (ZOSYN) IVPB 3.375 g  Status:  Discontinued     3.375 g 12.5 mL/hr over 240 Minutes Intravenous Every 8 hours 04/30/14 0931 04/30/14 0938   04/30/14 1200  imipenem-cilastatin (PRIMAXIN) 250 mg in sodium chloride 0.9 % 100 mL IVPB  Status:  Discontinued     250 mg 200 mL/hr over 30 Minutes Intravenous 4 times per day 04/30/14 1000 05/04/14 1410   04/30/14 1100  vancomycin (VANCOCIN) IVPB 1000 mg/200 mL premix     1,000 mg 200 mL/hr over 60 Minutes Intravenous  Once 04/30/14 0931 04/30/14 1258   04/30/14 1000  levofloxacin (LEVAQUIN) tablet 500 mg  Status:  Discontinued     500 mg Oral Daily 04/30/14 0224 04/30/14 0859      Assessment: 46 YOF on day # 11 of vancomycin for MRSA empyema s/p VATS. WBC is stable at 20. Patient is afebrile. Vancomycin trough is elevated at 22.9 on 1g IV q12h.  Goal of Therapy:  Vancomycin trough level 15-20 mcg/ml  Plan:  1. Decrease vancomycin to 750mg  IV q12h for predicted trough of 17.5. Next dose delayed until 8PM tonight to allow level to fall.  2. Continue to monitor and follow-up duration of therapy with MD.   Sloan Leiter, PharmD, BCPS Clinical Pharmacist [phone] 05/10/2014,3:03 PM

## 2014-05-10 NOTE — Progress Notes (Signed)
Speech Language Pathology Treatment: Dysphagia  Patient Details Name: [name] MRN: [id number] DOB: February 26, 1936 Today's Date: 05/10/2014 Time: 4656-8127 SLP Time Calculation (min): 16 min  Assessment / Plan / Recommendation Clinical Impression  Treatment focus on continued education re: esophageal precautions/strategies.  Pt. required mild-mod cues to state techniques and implement.  Consumption of regular solid and thin liquids appeared functional.  Recommend cups sips thin versus straw at present to decrease risk of air intake and decrease volume and velocity.  Per pt., MD stated possible ENT consult for assessment of vocal cord function which SLP feels would be beneficial.  Recommend continue Dys 3 softer texture to decrease dry/crumbly textures with dysphonia and thin liquids and continuation of esophageal precautions. ST will sign off, please reconsult if needed.   HPI HPI: 84 yoWF NHR remote ex smoker [facility] for acute resp failure for RLL PNA, pleural effusion and atrial fib with rVR. Transfered to [facility] ICU 5/16 d/t worsening resp status. Underwent VATS drainage of right empyema on 5/19, extubated on 5/20. Pulmonoligst questions VCD (possible abnormal phonation prior to intubation). History includes esophageal dilation for hypertensive LES (Dr Olevia Perches) & is on protonix two times a day. Pt has an MBS 01/07/14 showing normal oropharyngeal function, probable primary esophageal dysphagia. Pt coughs unrelated to PO intake.    Pertinent Vitals WDL  SLP Plan  Continue with current plan of care    Recommendations Diet recommendations: Dysphagia 3 (mechanical soft);Thin liquid Liquids provided via: Cup (preferably cup only) Medication Administration: Whole meds with puree Supervision: Patient able to self feed;Full supervision/cueing for compensatory strategies Compensations: Slow rate;Small sips/bites;Follow solids with liquid Postural Changes and/or Swallow Maneuvers: Seated upright  90 degrees;Upright 30-60 min after meal              Oral Care Recommendations: Oral care BID Follow up Recommendations: Inpatient Rehab Plan: Continue with current plan of care    GO     Houston Siren M.Ed [employer] [phone]  05/10/2014

## 2014-05-10 NOTE — Progress Notes (Signed)
05/10/2014 foley removed at 1250. Patient had 250cc of yellow cloudy urine. [facility] RN.

## 2014-05-10 NOTE — Progress Notes (Signed)
Pt transfer from 2C15, no c/o pain. Will monitor pt.

## 2014-05-10 NOTE — Clinical Social Work Note (Signed)
Clinical Social Worker continuing to follow patient and family for support and discharge planning needs.  CSW spoke with patient at bedside and patient son over the phone to provide available bed offers.  Patient and patient son both state that patient needs to go to a "[facility] facility," however are unable to state the reasoning behind the request.  Patient and patient son both state preference to [place] has called [facility] and left a message to further discuss possible bed offer.  CSW provided patient and son with available bed offers at [facility] and [facility].  CSW remains available for support and to facilitate patient discharge needs once medically stable.  Barbette Or, [facility]

## 2014-05-10 NOTE — Progress Notes (Signed)
       [address]       [place] [address]             [phone]          7 Days Post-Op Procedure(s) (LRB): VIDEO BRONCHOSCOPY (N/A) VIDEO ASSISTED THORACOSCOPY (VATS)/EMPYEMA (Right) DECORTICATION (Right)  Subjective: Cough, some chest congestion this am, but no further hemoptysis.   Objective: Vital signs in last 24 hours: Patient Vitals for the past 24 hrs:  BP Temp Temp src Pulse Resp SpO2 Weight  05/10/14 0721 - 98 F (36.7 C) Oral 88 20 99 % -  05/10/14 0645 126/70 mmHg - - - - - -  05/10/14 0600 126/70 mmHg - - 76 21 97 % -  05/10/14 0402 108/56 mmHg 98 F (36.7 C) Oral 78 21 100 % 159 lb 13.3 oz (72.5 kg)  05/10/14 0222 - - - - - 99 % -  05/10/14 0200 117/42 mmHg - - 78 19 99 % -  05/10/14 0000 129/41 mmHg - - 79 18 98 % -  05/09/14 2326 122/41 mmHg 98.2 F (36.8 C) Oral 79 23 98 % -  05/09/14 2212 125/52 mmHg - - 87 - - -  05/09/14 2044 - - - - - 100 % -  05/09/14 1959 140/60 mmHg 98.2 F (36.8 C) Oral 82 18 96 % -  05/09/14 1950 - - - 135 21 98 % -  05/09/14 1828 - - - - - 99 % -  05/09/14 1809 131/51 mmHg - - - - - -  05/09/14 1549 108/72 mmHg 98.1 F (36.7 C) Oral 74 19 100 % -  05/09/14 1514 - - - - - 100 % -  05/09/14 1241 - - - - - 100 % -  05/09/14 1200 125/57 mmHg 99.1 F (37.3 C) Oral 72 17 97 % -  05/09/14 1025 125/77 mmHg - - 83 - - -  05/09/14 0842 - - - - - 100 % -   Current Weight  05/10/14 159 lb 13.3 oz (72.5 kg)     Intake/Output from previous day: 05/25 0701 - 05/26 0700 In: 528 [P.O.:120; I.V.:208; IV Piggyback:200] Out: 1075 [Urine:1075]    PHYSICAL EXAM:  Heart: RRR Lungs: Coarse rhonchi bilaterally Wound: Clean and dry      Lab Results: CBC: Recent Labs  05/10/14 0430  WBC 20.0*  HGB 9.8*  HCT 27.7*  PLT 326   BMET:  Recent Labs  05/08/14 0615  NA 133*  K 3.6*  CL 98  CO2 23  GLUCOSE 87  BUN 8  CREATININE 0.50  CALCIUM 6.6*    PT/INR: No results found for this basename:  LABPROT, INR,  in the last 72 hours    Assessment/Plan: S/P Procedure(s) (LRB): VIDEO BRONCHOSCOPY (N/A) VIDEO ASSISTED THORACOSCOPY (VATS)/EMPYEMA (Right) DECORTICATION (Right) MRS empyema- stable from surgical standpoint.  On Vanc per pharmacy.  WBC up slightly today, no fever. Medical issues per pulm/CCM.   LOS: 11 days    Coolidge Breeze 05/10/2014

## 2014-05-11 ENCOUNTER — Inpatient Hospital Stay (HOSPITAL_COMMUNITY): Payer: PRIVATE HEALTH INSURANCE

## 2014-05-11 LAB — CBC
HCT: 25.8 % — ABNORMAL LOW (ref 36.0–46.0)
Hemoglobin: 9.1 g/dL — ABNORMAL LOW (ref 12.0–15.0)
MCH: 31.5 pg (ref 26.0–34.0)
MCHC: 35.3 g/dL (ref 30.0–36.0)
MCV: 89.3 fL (ref 78.0–100.0)
Platelets: 357 10*3/uL (ref 150–400)
RBC: 2.89 MIL/uL — ABNORMAL LOW (ref 3.87–5.11)
RDW: 14.6 % (ref 11.5–15.5)
WBC: 15.1 10*3/uL — ABNORMAL HIGH (ref 4.0–10.5)

## 2014-05-11 MED ORDER — DILTIAZEM HCL 30 MG PO TABS: 30.0000 mg | ORAL_TABLET | Freq: Four times a day (QID) | ORAL | Status: DC

## 2014-05-11 MED ORDER — SALINE SPRAY 0.65 % NA SOLN: 1.0000 | Freq: Every day | NASAL | Status: DC

## 2014-05-11 MED ORDER — IPRATROPIUM BROMIDE 0.02 % IN SOLN: 0.5000 mg | Freq: Four times a day (QID) | RESPIRATORY_TRACT | Status: DC

## 2014-05-11 MED ORDER — LEVALBUTEROL HCL 0.63 MG/3ML IN NEBU: 0.6300 mg | INHALATION_SOLUTION | Freq: Four times a day (QID) | RESPIRATORY_TRACT | Status: DC

## 2014-05-11 MED ORDER — ALPRAZOLAM 0.25 MG PO TABS: 0.2500 mg | ORAL_TABLET | Freq: Two times a day (BID) | ORAL | Status: DC | PRN

## 2014-05-11 MED ORDER — BISACODYL 5 MG PO TBEC: 10.0000 mg | DELAYED_RELEASE_TABLET | Freq: Every day | ORAL | Status: DC

## 2014-05-11 MED ORDER — SALINE SPRAY 0.65 % NA SOLN
1.0000 | Freq: Every day | NASAL | Status: DC
  Administered 2014-05-11 – 2014-05-14 (×20): 1 via NASAL
  Filled 2014-05-11: qty 44

## 2014-05-11 MED ORDER — LEVALBUTEROL HCL 0.63 MG/3ML IN NEBU: 0.6300 mg | INHALATION_SOLUTION | RESPIRATORY_TRACT | Status: DC | PRN

## 2014-05-11 MED ORDER — LIDOCAINE VISCOUS 2 % MT SOLN
15.0000 mL | OROMUCOSAL | Status: AC
  Administered 2014-05-11: 15 mL via OROMUCOSAL
  Filled 2014-05-11: qty 15

## 2014-05-11 NOTE — Progress Notes (Signed)
Per MD order, central line removed. IV cathter intact. Vaseline pressure gauze to site, pressure held x 5 min, no bleeding to site. Pt instructed not to get out of bed for 30 min after the removal of the central line. Instucted to keep dressing CDI x 24hours, if bleeding occurs hold pressure, if bleeding does not stop contact MD or go to the ED. Pt verbalized understanding and did not have any questions. Ferris Fielden M Loreen Bankson  

## 2014-05-11 NOTE — Discharge Summary (Signed)
Physician Discharge Summary       Patient ID: [name] MRN: [id number] DOB/AGE: 76-May-1937 76 y.o.  Admit date: 04/29/2014 Discharge date: 05/11/2014  Discharge Diagnoses:   Acute respiratory hypoxic failure  Pneumonia  MRSA Empyema s/p VATS 5/19 Asthmatic exacerbation Reactive airway disease Vocal cord dysfunction Hemoptysis  Cough  Atrial fibrillation  Heart failure  GERD Non-union of right ankle    Detailed Hospital Course:   [name] is an 78 y.o. female former smoker Followed by Dr Elsworth Soho in [place] office as recently as dec 2014 for reacitive airways (sinus v gerd). She presented to the ER at [facility] 04/29/2014 with persistent shortness of breath not responding to neb tx. She denied substernal Chest pain, but had some pain in the right posterior ribs. She has no fever, chills, nausea or vomiting. Evaluation in the ER included a CXR which showed vasuclar congestion and interstitial edema, BNP of 1774, negative troponin, and EKG showed afib with RVR of 140. She was given NTG, IV Lasix, and 10mg  of Cardiazem IV. HR improved w/ controlled rate. CT chest neg for PE, mod right pleural effusion ? Loculated, RLL consolidation. Pt was admitted to [facility], no sign improvement in resp status 5/16 with transfer to [facility] ICU for PCCM to admit with working diagnosis of Right lower lobe pneumonia with loculated pleural effusion and worsening respiratory failure. She was started on Vanc and Primaxin .  Requiring NRB to keep sats >90%. Thoracic surgery was consulted. Had CT guided 14 fr chest tube placed on 5/18. This did not drain adequately so she ended up going to OR for right VATS on 5/19 by Dr Servando Snare. Cultures from VATS came back as positive for MRSA. Antibiotics were narrowed to vancomycin mono-coverage and course has been completed. All chest tubes were removed 5/23.  Oxygen was weaned to 2 liters. Efforts were focused on rehab and conditioning. Her course was complicated by persistent cough and  upper airway wheezing consistent with upper exacerbation of her VCD. We maximized GERD treatment and also asked ENT to evaluate her. She was seen by Dr Constance Holster who did a flexible laryngoscopy at the bedside. The findings were consistent with diffuse inflammation of the vocal cords and supraglottic larynx. In addition the the therapies we had already added he added nasal saline washes. She improved to point that she was cleared for d/c as of 5/27 and she will be discharged to SNF with the plan as outlined below per active problem list.     Discharge Plan by diagnoses   Acute Hypoxic Respiratory failure:  Multifactorial secondary to PNA, Asthma exacerbation, Heart Failure, MRSA empyema s/p VATS 0000000, complicated by element of Vocal cord dysfunction and cough   Plan :   - O2 to keep sat >90%   - Continue nebs - atrovent, xopenex.   - Max rx for GERD added 5/25 and advised no mint/menthol products   -ENT has recommended addition of nasal saline spray and nasal steroid   - Continue guaifenesin.   - Vanc completed    - Dys 3 softer texture to decrease dry/crumbly textures with dysphonia and thin liquids and continuation of  esophageal precautions  -f/u thoracic surgery appointment made   -f/u dr Elsworth Soho June 29th at 11am   Atrial Fib w/ RVR > now in NSR  CHF  Plan:   - Cont diuresis   - resume xarelto on dc   - Cont cardizem PO and low dose beta-blocker   GERD  Plan:   - PPI.  Hypothyroidism  Plan:   - Continue synthroid.    Anxiety /Agitation / Baseline fibromylagia - on xanax, oxycodone, zoloft, ultram at baseline  Plan:   - Low dose xanax if needed for anxiety.   - cont zoloft    RLE in cast from recent ankle surgery  Plan:   - PT recommending SNF placement.   - Non weight bearing on right lower extremity   - f/u with Dr Doran Durand on June 29 at  109 am will need to arrange transportation from [facility] tests/ studies/ interventions and procedures   Consults Cardiothoracic surgery   LINES / TUBES:  Right pig tail via IR 5/18/ VATS with CT chest tube 5/19 >>> all tubes out 5/23   CULTURES:  5/15 BC x 2 >>ng  RVP 5/16 >> para influenza  Pleural 5/19>>>ng  Tissue 5/19 pleural peel >> MRSA  Bronchial Washings 5/19> MSSA   ANTIBIOTICS:  5/15 Vanc >>> 5/27 5/15 Primaxin > 5/25   Discharge Exam: BP 136/78  Pulse 69  Temp(Src) 98.4 F (36.9 C) (Oral)  Resp 18  Ht 4\' 11"  (1.499 m)  Wt 74 kg (163 lb 2.3 oz)  BMI 32.93 kg/m2  SpO2 97% Room air  General: Resting in bed, in NAD.  Neuro: A&O x 3  HEENT: Intact  Cardiovascular: RRR , no m/r/g  Lungs: diminished bs in bases , R chest tube site dressing C/D/I. Mild pseudowheeze.  Abdomen: Soft nt , bs +  Musculoskeletal: Intact . RLE distal in cast  Skin: Intact   Labs at discharge Lab Results  Component Value Date   CREATININE 0.50 05/08/2014   BUN 8 05/08/2014   NA 133* 05/08/2014   K 3.6* 05/08/2014   CL 98 05/08/2014   CO2 23 05/08/2014   Lab Results  Component Value Date   WBC 15.1* 05/11/2014   HGB 9.1* 05/11/2014   HCT 25.8* 05/11/2014   MCV 89.3 05/11/2014   PLT 357 05/11/2014   Lab Results  Component Value Date   ALT 19 05/05/2014   AST 29 05/05/2014   ALKPHOS 103 05/05/2014   BILITOT 0.6 05/05/2014   Lab Results  Component Value Date   INR 1.12 05/02/2014   INR 3.0 03/17/2013   INR 1.0 05/03/2009    Current radiology studies Dg Chest Port 1 View  05/11/2014   CLINICAL DATA:  Followup right empyema  EXAM: PORTABLE CHEST - 1 VIEW  COMPARISON:  05/08/2014  FINDINGS: Right pleural fluid and hazy right lung airspace opacities and ill-defined interstitial densities are stable. No new lung opacities. No pneumothorax.  Right subclavian central venous line tip lies in the lower superior vena cava, well positioned.  IMPRESSION: Stable appearance from the prior study. Persistent right pleural effusion and lung opacities. No pneumothorax.   Electronically Signed   By: Lajean Manes M.D.   On: 05/11/2014 07:26    Disposition:  03-Skilled Nursing Facility      Discharge Instructions   Diet - low sodium heart healthy    Complete by:  As directed   Dys 3 softer texture to decrease dry/crumbly textures     Increase activity slowly    Complete by:  As directed   Non-weight bearing right lower extremity            Medication List    STOP taking these medications       albuterol (2.5 MG/3ML) 0.083% nebulizer solution  Commonly known  as:  PROVENTIL     albuterol 108 (90 BASE) MCG/ACT inhaler  Commonly known as:  PROVENTIL HFA;VENTOLIN HFA     cyclobenzaprine 10 MG tablet  Commonly known as:  FLEXERIL     dicyclomine 10 MG capsule  Commonly known as:  BENTYL     isosorbide mononitrate 60 MG 24 hr tablet  Commonly known as:  IMDUR     levofloxacin 500 MG tablet  Commonly known as:  LEVAQUIN     methylPREDNISolone sodium succinate 40 mg/mL injection  Commonly known as:  SOLU-MEDROL     predniSONE 20 MG tablet  Commonly known as:  DELTASONE     traMADol 50 MG tablet  Commonly known as:  ULTRAM     VITAMIN E PO      TAKE these medications       acetaminophen 325 MG tablet  Commonly known as:  TYLENOL  Take 650 mg by mouth every 6 (six) hours as needed. For pain/fever     ALPRAZolam 0.5 MG tablet  Commonly known as:  XANAX  Take 1 mg by mouth at bedtime as needed for anxiety.     ALPRAZolam 0.25 MG tablet  Commonly known as:  XANAX  Take 1 tablet (0.25 mg total) by mouth 2 (two) times daily as needed for anxiety.     bisacodyl 5 MG EC tablet  Commonly known as:  DULCOLAX  Take 2 tablets (10 mg total) by mouth daily.     CALCIUM 600+D 600-400 MG-UNIT per tablet  Generic drug:  Calcium Carbonate-Vitamin D  Take 1 tablet by mouth daily at 6 PM.     cholecalciferol 1000 UNITS tablet  Commonly known as:  VITAMIN D  Take 1,000 Units by mouth 2 (two) times daily with breakfast and lunch.     denosumab 60 MG/ML Soln injection   Commonly known as:  PROLIA  Inject 60 mg into the skin every 6 (six) months. Administer in upper arm, thigh, or abdomen     dextromethorphan-guaiFENesin 30-600 MG per 12 hr tablet  Commonly known as:  MUCINEX DM  Take 1 tablet by mouth every 12 (twelve) hours. scheduled     diltiazem 30 MG tablet  Commonly known as:  CARDIZEM  Take 1 tablet (30 mg total) by mouth every 6 (six) hours.     fluticasone 50 MCG/ACT nasal spray  Commonly known as:  FLONASE  Place 2 sprays into both nostrils daily.     ipratropium 0.02 % nebulizer solution  Commonly known as:  ATROVENT  Take 2.5 mLs (0.5 mg total) by nebulization every 6 (six) hours.     levalbuterol 0.63 MG/3ML nebulizer solution  Commonly known as:  XOPENEX  Take 3 mLs (0.63 mg total) by nebulization every 2 (two) hours as needed for wheezing or shortness of breath.     levalbuterol 0.63 MG/3ML nebulizer solution  Commonly known as:  XOPENEX  Take 3 mLs (0.63 mg total) by nebulization every 6 (six) hours.     levothyroxine 25 MCG tablet  Commonly known as:  SYNTHROID, LEVOTHROID  Take 50 mcg by mouth daily before breakfast.     loratadine 10 MG tablet  Commonly known as:  CLARITIN  Take 10 mg by mouth 2 (two) times daily.     metoprolol 50 MG tablet  Commonly known as:  LOPRESSOR  Take 50 mg by mouth 2 (two) times daily.     montelukast 10 MG tablet  Commonly known as:  SINGULAIR  Take  10 mg by mouth at bedtime.     multivitamin with minerals Tabs tablet  Take 1 tablet by mouth daily.     nitroGLYCERIN 0.4 MG SL tablet  Commonly known as:  NITROSTAT  Place 0.4 mg under the tongue every 5 (five) minutes as needed. For chest pain     omega-3 acid ethyl esters 1 G capsule  Commonly known as:  LOVAZA  Take 2 g by mouth 2 (two) times daily.     omeprazole-sodium bicarbonate 40-1100 MG per capsule  Commonly known as:  ZEGERID  Take 1 capsule by mouth daily before breakfast.     ondansetron 4 MG tablet  Commonly known  as:  ZOFRAN  Take 4 mg by mouth every 8 (eight) hours as needed for nausea or vomiting.     oxyCODONE 5 MG immediate release tablet  Commonly known as:  Oxy IR/ROXICODONE  Take one tablet by mouth every 4 hours as needed for moderate to severe pain     rivaroxaban 20 MG Tabs tablet  Commonly known as:  XARELTO  Take 1 tablet (20 mg total) by mouth daily with supper.     rosuvastatin 20 MG tablet  Commonly known as:  CRESTOR  Take 20 mg by mouth at bedtime.     sertraline 100 MG tablet  Commonly known as:  ZOLOFT  Take 100 mg by mouth at bedtime.     sodium chloride 0.65 % Soln nasal spray  Commonly known as:  OCEAN  Place 1 spray into both nostrils 6 (six) times daily.     VITAMIN C PO  Take 1 tablet by mouth every morning.       Follow-up Information   Follow up with Wylene Simmer, MD On 05/13/2014. (1030 am )    Specialty:  Orthopedic Surgery   Contact information:   [address] [place] [address] [phone]       Follow up with [name], MD.   Specialty:  Cardiothoracic Surgery   Contact information:   [place] [address] [phone]       Follow up with Rigoberto Noel., MD On 06/13/2014. (11am )    Specialty:  Pulmonary Disease   Contact information:   [address] [address] [phone]       Follow up with [name], MD On 06/02/2014. (Have a chest x-ray at [facility] at 10:00, then see MD at 11:00)    Specialty:  Cardiothoracic Surgery   Contact information:   [place] [address] [phone]       Follow up with [facility] N[facility] On 05/18/2014. (For suture removal by RN at 10:00)       Discharged Condition: fair  Physician Statement:   The Patient was personally examined, the discharge assessment and plan has been personally reviewed and I agree with ACNP Babcock's assessment and plan. > 30 minutes of time have been dedicated  to discharge assessment, planning and discharge instructions.   Signed: Erick Colace 05/11/2014, 1:12 PM  Independently examined pt, evaluated data & formulated above discharge care plan with NP who scribed this note & edited by me. Discussed discharge care plan with pt & son Tyler Pita MD. FCCP. [facility] Pager [phone] If no response call [phone]

## 2014-05-11 NOTE — Progress Notes (Signed)
Per Dr. Alva Garnet, pt can be discharged after the f/u chest xray is completed if pt is not in any respiratory distress. Films do not need to be seen by anyone ahead of discharge.

## 2014-05-11 NOTE — Consult Note (Signed)
Reason for Consult: Hoarseness Referring Physician: Brand Males, MD  [name] is an 78 y.o. female.  HPI: Admitted for an empyema, has had chest tube. Trouble with her voice. History of reflux. Seen in the office last year for reflux.  Past Medical History  Diagnosis Date  . GERD (gastroesophageal reflux disease)   . Anxiety disorder   . Arthritis   . CAD (coronary artery disease)     Cath May 2010.  Nonbstructive  . Depression   . Hypothyroid   . Asthmatic bronchitis   . Hypertension     dr Percival Spanish  . OA (osteoarthritis)   . Chronic bronchitis   . 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 Encephalitis     d/t meningitis  . PONV (postoperative nausea and vomiting)     history of cardiac arrest day 1 post surgery  in 2008  . Esophageal motility disorder   . Atrial fibrillation   . Hyperlipidemia   . Fibromyalgia   . Vitamin D deficiency   . Status post dilation of esophageal narrowing   . Bowel obstruction     blockage    Past Surgical History  Procedure Laterality Date  . Total knee arthroplasty Right   . Back surgery    . Coronary angioplasty with stent placement  2003  . Sinus surgery with instatrak    . Knee arthroscopy  03/26/2012    Procedure: ARTHROSCOPY KNEE;  Surgeon: Wylene Simmer, MD;  Location: [place];  Service: Orthopedics;  Laterality: Left;  with Debridement of Lateral Meniscus tear  . Rotator cuff repair Bilateral   . Ankle fusion  08/27/2012    Procedure: ARTHRODESIS ANKLE;  Surgeon: Wylene Simmer, MD;  Location: [place];  Service: Orthopedics;  Laterality: Right;  Arthrodesis right ankle and subtalar joint  . Foot arthrodesis, subtalar Right 2013  . Removal of implant Right 03/31/2014    DR HEWITT  . Hardware revision  03/31/2014    SUBTALOR  ARTHRODESIS       DR HEWITT  . Hardware removal Right 03/31/2014    Procedure: REMOVAL OF DEEP IMPLANTS X 3  RIGHT ;  Surgeon: Wylene Simmer, MD;  Location: [place] [facility];  Service: Orthopedics;   Laterality: Right;  . Arthrodesis tibiofibular Right 03/31/2014    Procedure: REVISION OF SUBTALOR ARTHRODESIS  RIGHT ;  Surgeon: Wylene Simmer, MD;  Location: [place] [facility];  Service: Orthopedics;  Laterality: Right;  . Video bronchoscopy N/A 05/03/2014    Procedure: VIDEO BRONCHOSCOPY;  Surgeon: [name], MD;  [facility];  Service: Thoracic;  Laterality: N/A;  . Video assisted thoracoscopy (vats)/empyema Right 05/03/2014    Procedure: VIDEO ASSISTED THORACOSCOPY (VATS)/EMPYEMA;  Surgeon: [name], MD;  Location: [place];  Service: Thoracic;  Laterality: Right;  . Decortication Right 05/03/2014    Procedure: DECORTICATION;  Surgeon: [name], MD;  Location: [facility];  Service: Thoracic;  Laterality: Right;    Family History  Problem Relation Age of Onset  . Prostate cancer Brother   . Heart disease Mother   . Emphysema Sister   . Asthma Mother     Social History:  reports that she quit smoking about 24 years ago. Her smoking use included Cigarettes. She has a 15 pack-year smoking history. She has never used smokeless tobacco. She reports that she does not drink alcohol or use illicit drugs.  Allergies:  Allergies  Allergen Reactions  . Aspirin Other (See Comments)  REACTION: regular strength causes "heart to beat fast"  . Captopril Hypertension  . Naproxen Other (See Comments)    Tongue swelling  . Penicillins     Swelling around site  . Sulfonamide Derivatives Hives  . Clindamycin/Lincomycin     Medications: Reviewed  Results for orders placed during the hospital encounter of 04/29/14 (from the past 48 hour(s))  CBC     Status: Abnormal   Collection Time    05/10/14  4:30 AM      Result Value Ref Range   WBC 20.0 (*) 4.0 - 10.5 K/uL   RBC 3.15 (*) 3.87 - 5.11 MIL/uL   Hemoglobin 9.8 (*) 12.0 - 15.0 g/dL   HCT 27.7 (*) 36.0 - 46.0 %   MCV 87.9  78.0 - 100.0 fL   MCH 31.1  26.0 - 34.0 pg   MCHC 35.4  30.0 - 36.0 g/dL   RDW 14.7  11.5 - 15.5 %    Platelets 326  150 - 400 K/uL  VANCOMYCIN, TROUGH     Status: Abnormal   Collection Time    05/10/14  1:30 PM      Result Value Ref Range   Vancomycin Tr 22.9 (*) 10.0 - 20.0 ug/mL  CBC     Status: Abnormal   Collection Time    05/11/14  6:40 AM      Result Value Ref Range   WBC 15.1 (*) 4.0 - 10.5 K/uL   RBC 2.89 (*) 3.87 - 5.11 MIL/uL   Hemoglobin 9.1 (*) 12.0 - 15.0 g/dL   HCT 25.8 (*) 36.0 - 46.0 %   MCV 89.3  78.0 - 100.0 fL   MCH 31.5  26.0 - 34.0 pg   MCHC 35.3  30.0 - 36.0 g/dL   RDW 14.6  11.5 - 15.5 %   Platelets 357  150 - 400 K/uL    Dg Chest Port 1 View  05/11/2014   CLINICAL DATA:  Followup right empyema  EXAM: PORTABLE CHEST - 1 VIEW  COMPARISON:  05/08/2014  FINDINGS: Right pleural fluid and hazy right lung airspace opacities and ill-defined interstitial densities are stable. No new lung opacities. No pneumothorax.  Right subclavian central venous line tip lies in the lower superior vena cava, well positioned.  IMPRESSION: Stable appearance from the prior study. Persistent right pleural effusion and lung opacities. No pneumothorax.   Electronically Signed   By: Lajean Manes M.D.   On: 05/11/2014 07:26    IOE:[id number] except as listed in admit H&P  Blood pressure 136/78, pulse 69, temperature 98.4 F (36.9 C), temperature source Oral, resp. rate 18, height 4\' 11"  (1.499 m), weight 163 lb 2.3 oz (74 kg), SpO2 97.00%.  PHYSICAL EXAM: Overall appearance: Elderly lady, in no distress, coughs frequently Head:  Normocephalic, atraumatic. Ears: External ears normal. Nose: External nose is healthy in appearance. Internal nasal exam reveals leftward septal deviation. Diffuse crusting of the right nasal cavity. Oral Cavity:  There are no mucosal lesions or masses identified. Oral Pharynx/Hypopharynx/Larynx: no signs of any mucosal lesions or masses identified.  Larynx/Hypopharynx: See below Neuro:  No identifiable neurologic deficits. Neck: No palpable neck  masses.  Studies Reviewed: none  Procedures: Flexible fiberoptic laryngoscopy was performed. Cords move well. Diffuse inflammation of the cords and supraglottic larynx. No obstruction. No tumors. Oral pharynx and hypopharynx are clear. Nasopharynx clear. Nasal cavity filled with crusty dry blood on the right side.   Assessment/Plan: Larynx with normal mobility, no lesions noted. She does have  severe inflammatory changes throughout the larynx probably related to this infectious process. As the infection clears, her voice should return to her normal premorbid state. No further therapy needed. I will order some nasal saline spray for her nasal cavities.  [name] 05/11/2014, 10:42 AM

## 2014-05-11 NOTE — Progress Notes (Signed)
Physical Therapy Re-Evaluation Patient Details Name: [name] MRN: [id number] DOB: 1936/11/17 Today's Date: 05/11/2014    History of Present Illness 78 y.o. female adm with PNA, empyema. Underwent VATS on 05/03/14. Pt with recent REMOVAL OF DEEP IMPLANTS X 3  and REVISION OF SUBTALOR ARTHRODESIS on RIGHT and will be NWB on RLE for a couple more weeks.    PT Comments    Pt progressing towards physical therapy goals. Functionally improved from last PT visit - goals updated to reflect this. Continue to recommend SNF at discharge to improve safety and independence with mobility.   Follow Up Recommendations  SNF     Equipment Recommendations  Other (comment) (TBD at next venue of care)    Recommendations for Other Services       Precautions / Restrictions Precautions Precautions: Fall Restrictions Weight Bearing Restrictions: Yes RLE Weight Bearing: Non weight bearing LLE Weight Bearing: Weight bearing as tolerated    Mobility  Bed Mobility Overal bed mobility: Needs Assistance Bed Mobility: Supine to Sit     Supine to sit: Min guard     General bed mobility comments: Pt able to perform transition to EOB with trunk support and steadying assist. VC's for pt to scoot forward to rest feet on floor - pt able to recall NWB precautions on RLE.   Transfers Overall transfer level: Needs assistance Equipment used: Rolling walker (2 wheeled) Transfers: Sit to/from Stand Sit to Stand: Min assist;+2 physical assistance         General transfer comment: Pt able to power-up to full standing position with +2 assist and cueing for NWB status on RLE. Pt bearing some weight initially (appeared to be TTWB) but was able to hold LE completely off ground after she gained her balance on the LLE. Pt able to hold static stand with support from therapist and tech for about 20 seconds.   Ambulation/Gait             General Gait Details: Pt declining ambulation attempt at this time due  to sudden nausea after stand.    Stairs            Wheelchair Mobility    Modified Rankin (Stroke Patients Only)       Balance Overall balance assessment: Needs assistance Sitting-balance support: Feet supported;Bilateral upper extremity supported Sitting balance-Leahy Scale: Fair     Standing balance support: Bilateral upper extremity supported Standing balance-Leahy Scale: Poor Standing balance comment: Assist from gait belt at waist to maintain standing while maintaining NWB status on RLE.                    Cognition Arousal/Alertness: Awake/alert Behavior During Therapy: WFL for tasks assessed/performed Overall Cognitive Status: Difficult to assess                      Exercises      General Comments        Pertinent Vitals/Pain Vitals stable on RA throughout session.     Home Living                      Prior Function            PT Goals (current goals can now be found in the care plan section) Acute Rehab PT Goals Patient Stated Goal: To discharge today.  PT Goal Formulation: With patient Time For Goal Achievement: 05/12/14 Potential to Achieve Goals: Fair Progress towards PT goals: Progressing toward  goals    Frequency  Min 2X/week    PT Plan Current plan remains appropriate    Co-evaluation             End of Session Equipment Utilized During Treatment: Gait belt Activity Tolerance: Other (comment) (Pt limited by nausea) Patient left: in bed;with bed alarm set;with call bell/phone within reach     Time: 1531-1550 PT Time Calculation (min): 19 min  Charges:  $Therapeutic Activity: 8-22 mins                    G Codes:      Jolyn Lent 06-04-14, 4:29 PM  Jolyn Lent, PT, DPT Acute Rehabilitation Services Pager: [phone]

## 2014-05-11 NOTE — Clinical Social Work Note (Signed)
3:45PM: CSW received call from patient's RN that order has been placed for xray and that this could possibly delay discharge. CSW spoke with Dr. Servando Snare who wants xray performed and reviewed prior to discharge. Dr. Servando Snare will then make his recommendations. RN will discharge patient if patient is cleared for discharge. RN has number for report and transport numbers. DC packet is on chart. RN has been instructed to make sure patient's son in contacted if patient is transported to facility.   4:50PM: CSW was notified by facility that patient will be placed in a shared room tonight and transitioned to a private room tomorrow. Facility said that they had a private room for patient earlier in the day and CSW inquired about why this had changed. Facility was unable to give a reason and only able to say that patient would have a private room starting tomorrow (05/12/14). CSW asked facility's admissions coordinator if the patient needed to be placed in a private room due to her contact isolation for MRSA (which was indicated in documentation sent to facility days ago). Admissions coordinator Levada Dy stated, "We have reviewed her documentation and she can be in a shared room until tomorrow. I've already spoken with the son and he is ok with this.  CSW contacted patient's son who was upset by the news that the patient would not have a private room. Patient's son Octavia Bruckner states, "I don't understand why she can't stay another night in the hospital since she will have a private room tomorrow." CSW explained that once a patient is deemed medically stable for DC that the patient needs to be discharged to the next level of care, in this case SNF. CSW empathized with patient's son and validated his frustration with the situation. Tim states that he would like to make a complaint and speak with management. CSW offered to have CSW Surveyor, quantity contact patient's son to discuss the situation further. Patient's son was appreciative  of this. CSW Surveyor, quantity will contact patient's son in the morning.     Full placement note to come once patient has been discharged.   Liz Beach MSW, [place], [place], [phone]

## 2014-05-11 NOTE — Clinical Social Work Placement (Signed)
Clinical Social Work Department CLINICAL SOCIAL WORK PLACEMENT NOTE 05/11/2014  Patient:  [name], [name]  Account Number:  [id number] [place] date:  04/29/2014  Clinical Social Worker:  Megan Salon  Date/time:  05/06/2014 03:51 PM  Clinical Social Work is seeking post-discharge placement for this patient at the following level of care:   SKILLED NURSING   (*CSW will update this form in Epic as items are completed)   05/06/2014  Patient/family provided with [facility] Department of Clinical Social Work's list of facilities offering this level of care within the geographic area requested by the patient (or if unable, by the patient's family).  05/06/2014  Patient/family informed of their freedom to choose among providers that offer the needed level of care, that participate in Medicare, Medicaid or managed care program needed by the patient, have an available bed and are willing to accept the patient.  05/06/2014  Patient/family informed of [facility]' ownership interest in [facility], as well as of the fact that they are under no obligation to receive care at this facility.  PASARR submitted to EDS on 05/06/2014 PASARR number received from EDS on 05/06/2014  FL2 transmitted to all facilities in geographic area requested by pt/family on  05/06/2014 FL2 transmitted to all facilities within larger geographic area on   Patient informed that his/her managed care company has contracts with or will negotiate with  certain facilities, including the following:     Patient/family informed of bed offers received:  05/10/2014 Patient chooses bed at [facility] Physician recommends and patient chooses bed at    Patient to be transferred to [place] on   Patient to be transferred to facility by   The following physician request were entered in Epic:   Additional Comments:    Liz Beach MSW, [place], [place], [phone]

## 2014-05-11 NOTE — Progress Notes (Addendum)
8 Days Post-Op Procedure(s) (LRB): VIDEO BRONCHOSCOPY (N/A) VIDEO ASSISTED THORACOSCOPY (VATS)/EMPYEMA (Right) DECORTICATION (Right) Subjective: Feeling a bit better, +cough and hoarseness persist   Objective: Vital signs in last 24 hours: Temp:  [98.2 F (36.8 C)-98.8 F (37.1 C)] 98.4 F (36.9 C) (05/27 0427) Pulse Rate:  [69-88] 69 (05/27 0427) Cardiac Rhythm:  [-] Normal sinus rhythm (05/26 0830) Resp:  [17-23] 18 (05/27 0427) BP: (115-147)/(64-81) 136/78 mmHg (05/27 0427) SpO2:  [93 %-100 %] 97 % (05/27 0427) Weight:  [163 lb 2.3 oz (74 kg)] 163 lb 2.3 oz (74 kg) (05/27 0427)  Hemodynamic parameters for last 24 hours:    Intake/Output from previous day: 05/26 0701 - 05/27 0700 In: 240 [P.O.:240] Out: 350 [Urine:350] Intake/Output this shift:    General appearance: alert, cooperative and no distress Heart: regular rate and rhythm Lungs: dim inlower fields, coarse upper airway ronchi Abdomen: benign Extremities: no sig edema noted Wound: incis healing well  Lab Results:  Recent Labs  05/10/14 0430 05/11/14 0640  WBC 20.0* 15.1*  HGB 9.8* 9.1*  HCT 27.7* 25.8*  PLT 326 357   BMET: No results found for this basename: NA, K, CL, CO2, GLUCOSE, BUN, CREATININE, CALCIUM,  in the last 72 hours  PT/INR: No results found for this basename: LABPROT, INR,  in the last 72 hours ABG    Component Value Date/Time   PHART 7.400 05/04/2014 1020   HCO3 29.4* 05/04/2014 1020   TCO2 31 05/04/2014 1020   O2SAT 96.0 05/04/2014 1020   CBG (last 3)   Recent Labs  05/09/14 0844  GLUCAP 100*   Scheduled Meds: . atorvastatin  10 mg Oral q1800  . bisacodyl  10 mg Oral Daily  . diltiazem  30 mg Oral 4 times per day  . enoxaparin (LOVENOX) injection  1 mg/kg Subcutaneous Q12H  . fluticasone  2 spray Each Nare Daily  . ipratropium  0.5 mg Nebulization Q6H  . levalbuterol  0.63 mg Nebulization Q6H  . levothyroxine  50 mcg Oral QAC breakfast  . metoprolol tartrate  25 mg Oral  BID  . montelukast  10 mg Oral QHS  . pantoprazole  40 mg Oral BID AC  . senna-docusate  1 tablet Oral QHS  . sertraline  100 mg Oral QHS  . sodium chloride  3 mL Intravenous Q12H  . vancomycin  750 mg Intravenous Q12H   Continuous Infusions: . sodium chloride 10 mL/hr (05/09/14 2340)   PRN Meds:.ALPRAZolam, ALPRAZolam, benzocaine, fentaNYL, guaifenesin, levalbuterol, metoprolol, ondansetron (ZOFRAN) IV, sodium chloride  Dg Chest Port 1 View  05/11/2014   CLINICAL DATA:  Followup right empyema  EXAM: PORTABLE CHEST - 1 VIEW  COMPARISON:  05/08/2014  FINDINGS: Right pleural fluid and hazy right lung airspace opacities and ill-defined interstitial densities are stable. No new lung opacities. No pneumothorax.  Right subclavian central venous line tip lies in the lower superior vena cava, well positioned.  IMPRESSION: Stable appearance from the prior study. Persistent right pleural effusion and lung opacities. No pneumothorax.   Electronically Signed   By: Lajean Manes M.D.   On: 05/11/2014 07:26    Assessment/Plan: S/P Procedure(s) (LRB): VIDEO BRONCHOSCOPY (N/A) VIDEO ASSISTED THORACOSCOPY (VATS)/EMPYEMA (Right) DECORTICATION (Right)  1 conts aggressive pulm rx as per CCM 2 conts current abx 3 other management per primary service, SNF at discharge    LOS: 12 days    [name] 05/11/2014  Follow up pa & lat chest xray before d/c I have seen and  examined [name] and agree with the above assessment  and plan.  [name] MD Beeper [phone] Office [phone] 05/11/2014 3:38 PM

## 2014-05-11 NOTE — Progress Notes (Signed)
Dr. Alva Garnet said to go ahead with discharge only if Dr Servando Snare is in agreement following the chest xray. If not, postpone discharge until tomorrow.

## 2014-05-11 NOTE — Progress Notes (Signed)
[address]       [place] [address]             [phone]                 8 Days Post-Op Procedure(s) (LRB): VIDEO BRONCHOSCOPY (N/A) VIDEO ASSISTED THORACOSCOPY (VATS)/EMPYEMA (Right) DECORTICATION (Right)  LOS: 12 days   Subjective: Fatigued, does sit up with help  Objective: Vital signs in last 24 hours: Patient Vitals for the past 24 hrs:  BP Temp Temp src Pulse Resp SpO2 Weight  05/11/14 1331 124/70 mmHg 98.4 F (36.9 C) Oral 82 16 90 % -  05/11/14 0427 136/78 mmHg 98.4 F (36.9 C) Oral 69 18 97 % 163 lb 2.3 oz (74 kg)  05/11/14 0117 124/72 mmHg - - 73 - - -  05/10/14 2047 118/81 mmHg 98.2 F (36.8 C) Oral 88 18 98 % -    Filed Weights   05/09/14 0414 05/10/14 0402 05/11/14 0427  Weight: 160 lb 11.5 oz (72.9 kg) 159 lb 13.3 oz (72.5 kg) 163 lb 2.3 oz (74 kg)    Hemodynamic parameters for last 24 hours:    Intake/Output from previous day: 05/26 0701 - 05/27 0700 In: 240 [P.O.:240] Out: 350 [Urine:350] Intake/Output this shift:    Scheduled Meds: . atorvastatin  10 mg Oral q1800  . bisacodyl  10 mg Oral Daily  . diltiazem  30 mg Oral 4 times per day  . enoxaparin (LOVENOX) injection  1 mg/kg Subcutaneous Q12H  . fluticasone  2 spray Each Nare Daily  . ipratropium  0.5 mg Nebulization Q6H  . levalbuterol  0.63 mg Nebulization Q6H  . levothyroxine  50 mcg Oral QAC breakfast  . metoprolol tartrate  25 mg Oral BID  . montelukast  10 mg Oral QHS  . pantoprazole  40 mg Oral BID AC  . senna-docusate  1 tablet Oral QHS  . sertraline  100 mg Oral QHS  . sodium chloride  1 spray Each Nare 6 X Daily  . sodium chloride  3 mL Intravenous Q12H  . vancomycin  750 mg Intravenous Q12H   Continuous Infusions: . sodium chloride 10 mL/hr (05/09/14 2340)   PRN Meds:.ALPRAZolam, ALPRAZolam, benzocaine, fentaNYL, guaifenesin, levalbuterol, metoprolol, ondansetron (ZOFRAN) IV, sodium chloride  General appearance: alert, cooperative and  fatigued Neurologic: intact Heart: regular rate and rhythm, S1, S2 normal, no murmur, click, rub or gallop Lungs: diminished breath sounds RLL and RML Abdomen: soft, non-tender; bowel sounds normal; no masses,  no organomegaly Extremities: extremities normal, atraumatic, no cyanosis or edema and Homans sign is negative, no sign of DVT Wound: intact Increased ronchi this afternoon and audible gurgling Lab Results: CBC: Recent Labs  05/10/14 0430 05/11/14 0640  WBC 20.0* 15.1*  HGB 9.8* 9.1*  HCT 27.7* 25.8*  PLT 326 357   BMET: No results found for this basename: NA, K, CL, CO2, GLUCOSE, BUN, CREATININE, CALCIUM,  in the last 72 hours  PT/INR: No results found for this basename: LABPROT, INR,  in the last 72 hours   Radiology Dg Chest Port 1 View  05/11/2014   CLINICAL DATA:  Followup right empyema  EXAM: PORTABLE CHEST - 1 VIEW  COMPARISON:  05/08/2014  FINDINGS: Right pleural fluid and hazy right lung airspace opacities and ill-defined interstitial densities are stable. No new lung opacities. No pneumothorax.  Right subclavian central venous line tip lies in the lower superior vena cava, well positioned.  IMPRESSION: Stable appearance from  the prior study. Persistent right pleural effusion and lung opacities. No pneumothorax.   Electronically Signed   By: Lajean Manes M.D.   On: 05/11/2014 07:26     Assessment/Plan: S/P Procedure(s) (LRB): VIDEO BRONCHOSCOPY (N/A) VIDEO ASSISTED THORACOSCOPY (VATS)/EMPYEMA (Right) DECORTICATION (Right) Have ordered chest xray pa and lat before planned d/c With increased respiratory secretion s would wait on late d/c to SNF and consider d/c tomorrow if clearer   [name] MD 05/11/2014 5:45 PM

## 2014-05-11 NOTE — Progress Notes (Signed)
Dr Servando Snare asked to be paged to review chest xrays before patient is discharged.

## 2014-05-11 NOTE — Progress Notes (Signed)
PULMONARY / CRITICAL CARE MEDICINE   Name: [name] MRN: [id number] DOB: [birth year]    ADMISSION DATE:  04/29/2014 CONSULTATION DATE:  5/16   REFERRING MD :  [facility]  PRIMARY SERVICE:  PCCM   CHIEF COMPLAINT:  RLL PNA/Effusion [name]   BRIEF PATIENT DESCRIPTION:  6 yoWF NHR remote ex smoker admitted to [facility] 5/15 for acute resp failure for RLL PNA , pleural effusion and atrial fib with rVR .Transfered to [facility] ICU 5/16 d/t worsening resp status , PCCM   Adm   [facility] office pt w/ RAD (nml spiro 2014 )  Recent adm to rehab after ankle surgery 4/16-4/20 [facility]  SIGNIFICANT EVENTS / STUDIES:  5/15 CT chest >neg for PE , RLL consolidation/mod effusion  5/16 transfer from [facility] to [facility] ICU  5/19 VATS drainange of rt empyema  LINES / TUBES: Right pig tail via IR 5/18/ VATS with CT chest tube 5/19 >>> all tubes out  5/23   CULTURES: 5/15 BC x 2 >>ng RVP 5/16 >> para influenza Pleural 5/19>>>ng Tissue 5/19 pleural peel >> MRSA Bronchial Washings 5/19> MSSA   ANTIBIOTICS: 5/15 Vanc >>> 5/15 Primaxin > 5/25  SUBJECTIVE:    Voice improved but remains hoarse/ upper airway cough afebrile  VITAL SIGNS: Temp:  [98.2 F (36.8 C)-98.8 F (37.1 C)] 98.4 F (36.9 C) (05/27 0427) Pulse Rate:  [69-88] 69 (05/27 0427) Resp:  [17-23] 18 (05/27 0427) BP: (118-147)/(65-81) 136/78 mmHg (05/27 0427) SpO2:  [93 %-100 %] 97 % (05/27 0427) Weight:  [74 kg (163 lb 2.3 oz)] 74 kg (163 lb 2.3 oz) (05/27 0427) FIO2:  2lpm NP    HEMODYNAMICS:   VENTILATOR SETTINGS:   Now on [place] oxygen sats OK  INTAKE / OUTPUT:  Intake/Output Summary (Last 24 hours) at 05/11/14 1032 Last data filed at 05/10/14 1700  Gross per 24 hour  Intake    240 ml  Output    350 ml  Net   -110 ml     PHYSICAL EXAMINATION: General:  Resting in bed, in NAD. Neuro:  A&O x 3   HEENT:  Intact  Cardiovascular:  RRR , no m/r/g  Lungs: diminished bs in bases , R chest tube site dressing C/D/I.  Mild  pseudowheeze. Abdomen:  Soft nt , bs + Musculoskeletal:  Intact . RLE distal in cast Skin:  Intact   LABS:  CBC  Recent Labs Lab 05/07/14 0442 05/10/14 0430 05/11/14 0640  WBC 16.8* 20.0* 15.1*  HGB 10.0* 9.8* 9.1*  HCT 29.3* 27.7* 25.8*  PLT 260 326 357   Coag's No results found for this basename: APTT, INR,  in the last 168 hours BMET  Recent Labs Lab 05/06/14 0310 05/07/14 0442 05/08/14 0615  NA 129* 131* 133*  K 4.0 3.0* 3.6*  CL 95* 97 98  CO2 22 24 23   BUN 7 8 8   CREATININE 0.58 0.55 0.50  GLUCOSE 111* 132* 87   Electrolytes  Recent Labs Lab 05/06/14 0310 05/07/14 0442 05/08/14 0615  CALCIUM 6.5* 6.6* 6.6*   Sepsis Markers No results found for this basename: LATICACIDVEN, PROCALCITON, O2SATVEN,  in the last 168 hours ABG No results found for this basename: PHART, PCO2ART, PO2ART,  in the last 168 hours Liver Enzymes  Recent Labs Lab 05/05/14 0425  AST 29  ALT 19  ALKPHOS 103  BILITOT 0.6  ALBUMIN 1.6*   Cardiac Enzymes No results found for this basename: TROPONINI, PROBNP,  in the last 168 hours  Glucose  Recent Labs Lab 05/09/14 0844  GLUCAP 100*    Imaging Dg Chest Port 1 View  05/11/2014   CLINICAL DATA:  Followup right empyema  EXAM: PORTABLE CHEST - 1 VIEW  COMPARISON:  05/08/2014  FINDINGS: Right pleural fluid and hazy right lung airspace opacities and ill-defined interstitial densities are stable. No new lung opacities. No pneumothorax.  Right subclavian central venous line tip lies in the lower superior vena cava, well positioned.  IMPRESSION: Stable appearance from the prior study. Persistent right pleural effusion and lung opacities. No pneumothorax.   Electronically Signed   By: Lajean Manes M.D.   On: 05/11/2014 07:26      ASSESSMENT / PLAN:  PULMONARY A:  Acute Respiratory failure, Hypoxic; Multifactorial secondary to PNA, Asthma exacerbation, Heart Failure, pleural effusion - empyema s/p VATS 5/19   - has component  of VCD  Asthma/reactive airways Hemoptysis on lovenox after removal of chest tube. Cough Duplex LE for DVT negative, CT Angio neg for PE  P:   - O2 to keep sat >90%   - Continue nebs - atrovent, xopenex. - Max rx for GERD added 5/25 and advised no mint/menthol products - ?ENT input - has seen [name] in the past - Continue guaifenesin.  CARDIOVASCULAR A:  Atrial Fib w/ RVR > now in NSR  CHF  P:  - Cont diuresis - Lovenox for now, resume xarelto on dc - Cont metoprolol  prn  - Cont cardizem PO  RENAL A:   Hyponatremia  Hypokalemia P:   - Replace electrolytes as indicated.   GASTROINTESTINAL A:   GERD  P:   - PPI. - Dys 3 diet per SLP recs.  HEMATOLOGIC A:  No acute issues. P:  - Follow H/H.  INFECTIOUS A:   RLL PNA MRSA empyema  P:   - Continue Vanc through 5/28 (14 days total).  ENDOCRINE A:   Hyperglycemia - intermittent Hypothyroidism P:   - Continue synthroid.  NEUROLOGIC A:   Anxiety /Agitation / Baseline fibromylagia - on xanax, oxycodone, zoloft, ultram at baseline P:   - Use fentanyl for pain prn. - Low dose xanax if needed for anxiety. - Holding oxycodone, zoloft and ultram for now.   MSK A:  RLE in cast from recent surgery P: - PT recommending SNF placement. - NWB LLE.   Consider transfer to SNF today.  2 offers available per case management.  Attending to follow.   [name], [facility] Pgr: [phone]  or [phone]

## 2014-05-11 NOTE — Clinical Social Work Note (Signed)
2:00pm: [facility] called CSW and stated that they needed an update PT note for patient before patient could come to the facility for insurance purposes. CSW has obtained PT eval and treat order from MD. Facility's admission coordinator states that she needs note ASAP so that she can send this to the insurance. Once PT has seen patient, CSW will send updated note to facility and have patient transported to facility.    Liz Beach MSW, [place], [place], [phone]

## 2014-05-12 NOTE — Progress Notes (Signed)
Orthopedic Tech Progress Note Patient Details:  [name] [birth year] [id number] Spoke with nursing to clarify exactly what was to be ordered/applied here. Nurse was unaware of order and was going to call MD for clarification. Spoke with Tye Maryland from [facility] to see if they even carried cast shoes; no one available at this time. [employer] rep to call back with more info.  Patient ID: [name], female   DOB: 08-17-1936, 78 y.o.   MRN: [id number]   [name] 05/12/2014, 2:07 PM

## 2014-05-12 NOTE — Progress Notes (Addendum)
[address]       [place] [address]             [phone]                 9 Days Post-Op Procedure(s) (LRB): VIDEO BRONCHOSCOPY (N/A) VIDEO ASSISTED THORACOSCOPY (VATS)/EMPYEMA (Right) DECORTICATION (Right)  LOS: 13 days   Subjective: Patient states "short winded" with exertion. She is not having as much secretions.  Objective: Vital signs in last 24 hours: Patient Vitals for the past 24 hrs:  BP Temp Temp src Pulse Resp SpO2 Weight  05/12/14 0519 125/74 mmHg 98.4 F (36.9 C) Oral 65 12 92 % -  05/12/14 0500 - - - - - - 155 lb (70.308 kg)  05/12/14 0013 112/73 mmHg - - - - - -  05/11/14 2240 135/68 mmHg - - 79 - - -  05/11/14 2121 114/69 mmHg 97.7 F (36.5 C) Axillary 82 12 94 % -  05/11/14 1331 124/70 mmHg 98.4 F (36.9 C) Oral 82 16 90 % -    Filed Weights   05/10/14 0402 05/11/14 0427 05/12/14 0500  Weight: 159 lb 13.3 oz (72.5 kg) 163 lb 2.3 oz (74 kg) 155 lb (70.308 kg)    Hemodynamic parameters for last 24 hours:    Intake/Output from previous day: 05/27 0701 - 05/28 0700 In: 355.8 [I.V.:355.8] Out: 500 [Urine:500] Intake/Output this shift:    Scheduled Meds: . atorvastatin  10 mg Oral q1800  . bisacodyl  10 mg Oral Daily  . diltiazem  30 mg Oral 4 times per day  . enoxaparin (LOVENOX) injection  1 mg/kg Subcutaneous Q12H  . fluticasone  2 spray Each Nare Daily  . ipratropium  0.5 mg Nebulization Q6H  . levalbuterol  0.63 mg Nebulization Q6H  . levothyroxine  50 mcg Oral QAC breakfast  . metoprolol tartrate  25 mg Oral BID  . montelukast  10 mg Oral QHS  . pantoprazole  40 mg Oral BID AC  . senna-docusate  1 tablet Oral QHS  . sertraline  100 mg Oral QHS  . sodium chloride  1 spray Each Nare 6 X Daily  . sodium chloride  3 mL Intravenous Q12H  . vancomycin  750 mg Intravenous Q12H   Continuous Infusions: . sodium chloride 10 mL/hr (05/09/14 2340)   PRN Meds:.ALPRAZolam, ALPRAZolam, benzocaine, fentaNYL, guaifenesin,  levalbuterol, metoprolol, ondansetron (ZOFRAN) IV, sodium chloride  General appearance: Alert, cooperative and fatigued Heart: Regular rate and rhythm Lungs: Coarse breath sounds (rhonchi) Abdomen: soft, non-tender; bowel sounds normal; no masses,  no organomegaly Wound:Clean, dry, and no signs of infection  Lab Results:   CBC:  Recent Labs  05/10/14 0430 05/11/14 0640  WBC 20.0* 15.1*  HGB 9.8* 9.1*  HCT 27.7* 25.8*  PLT 326 357   BMET: No results found for this basename: NA, K, CL, CO2, GLUCOSE, BUN, CREATININE, CALCIUM,  in the last 72 hours  PT/INR: No results found for this basename: LABPROT, INR,  in the last 72 hours   Radiology Dg Chest 2 View  05/11/2014   CLINICAL DATA:  Postop chest surgery, right-sided chest discomfort, shortness of breath, rattling sounds, history bronchitis, coronary artery disease, atrial fibrillation, hypertension  EXAM: CHEST  2 VIEW  COMPARISON:  05/11/2014  FINDINGS: Enlargement of cardiac silhouette with pulmonary vascular congestion.  Calcified mildly tortuous thoracic aorta.  Persistent RIGHT pleural effusion and RIGHT lung infiltrates.  Interval removal of RIGHT subclavian line.  No  pneumothorax.  Osseous structures unremarkable.  IMPRESSION: Persistent RIGHT pleural effusion and scattered RIGHT lung infiltrates.  Enlargement of cardiac silhouette with pulmonary vascular congestion.   Electronically Signed   By: Lavonia Dana M.D.   On: 05/11/2014 18:35   Dg Chest Port 1 View  05/11/2014   CLINICAL DATA:  Followup right empyema  EXAM: PORTABLE CHEST - 1 VIEW  COMPARISON:  05/08/2014  FINDINGS: Right pleural fluid and hazy right lung airspace opacities and ill-defined interstitial densities are stable. No new lung opacities. No pneumothorax.  Right subclavian central venous line tip lies in the lower superior vena cava, well positioned.  IMPRESSION: Stable appearance from the prior study. Persistent right pleural effusion and lung opacities. No  pneumothorax.   Electronically Signed   By: Lajean Manes M.D.   On: 05/11/2014 07:26     Assessment/Plan: S/P Procedure(s) (LRB): VIDEO BRONCHOSCOPY (N/A) VIDEO ASSISTED THORACOSCOPY (VATS)/EMPYEMA (Right) DECORTICATION (Right) PA/LAT CXR yesterday evening showed enlargement of cardiac silhouette, pulmonary vascular congestion, no pneumothorax, and persistent right pleural effusion and scattered infiltrates. Patient to go to SNF soon-per CCM  [name] Pinks PA-C  05/12/2014 8:01 AM  Still congested, chest still with rll opacity but improving Still with cough Sutures removed so does not need appointment 6/2 for suture removal Patient reports that cast on foot was to be removed yesterday in ortho office but obviously did not make appointment, ? Consult orth about removal before d/c  I have seen and examined [name] and agree with the above assessment  and plan.  [name] MD Beeper [phone] Office [phone] 05/12/2014 8:43 AM

## 2014-05-12 NOTE — Clinical Social Work Note (Signed)
CSW spoke with patient's son to apologize for the confusion created yesterday with his mother's discharge. Patient's son was very appreciative of CSW following up with him and requests that Dr. Servando Snare contact him so that he can speak with him. CSW will continue to provide support to patient and son.    Liz Beach MSW, [place], [place], [phone]

## 2014-05-12 NOTE — Progress Notes (Signed)
Subjective: Called to see [name] since she's an inpatient.  She denies any pain in the right foot / ankle.  She has been NWB on the R LE since surgery 6 weeks ago.   Objective: Vital signs in last 24 hours: Temp:  [97.7 F (36.5 C)-98.4 F (36.9 C)] 98.2 F (36.8 C) (05/28 1319) Pulse Rate:  [65-82] 71 (05/28 1319) Resp:  [12] 12 (05/28 0519) BP: (112-136)/(66-74) 136/66 mmHg (05/28 1319) SpO2:  [92 %-94 %] 92 % (05/28 0519) Weight:  [70.308 kg (155 lb)] 70.308 kg (155 lb) (05/28 0500)  Intake/Output from previous day: 05/27 0701 - 05/28 0700 In: 355.8 [I.V.:355.8] Out: 500 [Urine:500] Intake/Output this shift:     Recent Labs  05/10/14 0430 05/11/14 0640  HGB 9.8* 9.1*    Recent Labs  05/10/14 0430 05/11/14 0640  WBC 20.0* 15.1*  RBC 3.15* 2.89*  HCT 27.7* 25.8*  PLT 326 357   No results found for this basename: NA, K, CL, CO2, BUN, CREATININE, GLUCOSE, CALCIUM,  in the last 72 hours No results found for this basename: LABPT, INR,  in the last 72 hours  PE:  wn elderly female in nad.  R LE immobilized in a cast.  NVI at toes.  Assessment/Plan: 6 weeks s/p revision subtalar arthrodesis - At this point, I'm going to keep her in a cast.  She can begin to bear weight in the cast for transfers and should f/u with me in clinic in 2-3 weeks for cast removal and films.  She understands this plan and agrees.   [name] 05/12/2014, 1:40 PM

## 2014-05-12 NOTE — Discharge Instructions (Signed)
Bear weight as tolerated on the right foot in the cast with a cast shoe for transfers.

## 2014-05-12 NOTE — Progress Notes (Signed)
Orthopedic Tech Progress Note Patient Details:  [name] 16-May-1936 [id number] Cast shoe ordered through [facility]. Spoke with Gregary Signs. Patient ID: [name], female   DOB: Dec 16, 1936, 78 y.o.   MRN: [id number]   [name] 05/12/2014, 2:58 PM

## 2014-05-13 LAB — CBC
HCT: 30.2 % — ABNORMAL LOW (ref 36.0–46.0)
Hemoglobin: 11.1 g/dL — ABNORMAL LOW (ref 12.0–15.0)
MCH: 33 pg (ref 26.0–34.0)
MCHC: 36.8 g/dL — ABNORMAL HIGH (ref 30.0–36.0)
MCV: 89.9 fL (ref 78.0–100.0)
Platelets: 289 10*3/uL (ref 150–400)
RBC: 3.36 MIL/uL — ABNORMAL LOW (ref 3.87–5.11)
RDW: 14.8 % (ref 11.5–15.5)
WBC: 16.4 10*3/uL — ABNORMAL HIGH (ref 4.0–10.5)

## 2014-05-13 MED ORDER — ONDANSETRON HCL 4 MG PO TABS
4.0000 mg | ORAL_TABLET | Freq: Four times a day (QID) | ORAL | Status: DC | PRN
  Administered 2014-05-13 – 2014-05-14 (×2): 4 mg via ORAL
  Filled 2014-05-13 (×2): qty 1

## 2014-05-13 MED ORDER — PREDNISONE 10 MG PO TABS: ORAL_TABLET | ORAL | Status: DC

## 2014-05-13 MED ORDER — PREDNISONE 20 MG PO TABS
20.0000 mg | ORAL_TABLET | Freq: Every day | ORAL | Status: DC
  Administered 2014-05-13 – 2014-05-14 (×2): 20 mg via ORAL
  Filled 2014-05-13 (×3): qty 1

## 2014-05-13 NOTE — Progress Notes (Signed)
PT Cancellation Note  Patient Details Name: [name] MRN: [id number] DOB: 1936-03-28   Cancelled Treatment:    Reason Eval/Treat Not Completed: Medical issues which prohibited therapy (pt stated she's very SOB and sick to her stomach, RN notified. ) Will follow.    Lucile Crater 05/13/2014, 1:24 PM [phone]

## 2014-05-13 NOTE — Clinical Social Work Note (Signed)
CSW has not heard from attending as to whether or not patient is stable for DC. Facility that patient is going to is prepared for a weekend discharge if necessary.   Liz Beach MSW, [place], [place], [phone]

## 2014-05-13 NOTE — Progress Notes (Signed)
On call physician paged for PO nausea med.

## 2014-05-13 NOTE — Progress Notes (Signed)
She has persistent upper airway cough of unclear etiology ENt evaluation negative (seen again by dr Constance Holster this admit) GI evaluation by dr Henrene Pastor She had responded to an extended course of prednisone in the past & we will try this again- note that she was started on sterios at the [facility] & received a couple of doses initially when I saw her when she had bronchospasm. She ahs persistent rt effusion/ infiltrate which may be post VATS - I do not think further drainage is required, this may just scar up eventually. She has received 2 weeks of antibiotics for MRSA so, doubt more needed - clinically decreased WC & afebrile Ok to discharge from my standpoint today  Recommend- in addition, start Prednisone 10 mg tabs  Take 2 tabs daily with food x 10 ds, then 1 tab daily with food Until seen in the office in 2-3 weks (with TP) FU with dr hewitt for cast, wt bearing ok  Kara Mead MD. FCCP. [facility] Pager [phone] If no response call [phone]

## 2014-05-13 NOTE — Progress Notes (Signed)
Pt coughed up small amount of bright red bloody sputum this AM.

## 2014-05-13 NOTE — Progress Notes (Addendum)
[address]       [place] [address]             [phone]                 10 Days Post-Op Procedure(s) (LRB): VIDEO BRONCHOSCOPY (N/A) VIDEO ASSISTED THORACOSCOPY (VATS)/EMPYEMA (Right) DECORTICATION (Right)  LOS: 14 days   Subjective: Patient states still "short winded" with exertion. She states secretions are blood tinged at times.  Objective: Vital signs in last 24 hours: Patient Vitals for the past 24 hrs:  BP Temp Temp src Pulse Resp SpO2 Weight  05/13/14 0520 115/77 mmHg 98.5 F (36.9 C) Oral 75 16 94 % -  05/13/14 0411 - - - - - - 155 lb 8 oz (70.534 kg)  05/13/14 0151 - - - - - 92 % -  05/12/14 2327 128/81 mmHg - - - - - -  05/12/14 2210 145/82 mmHg - - 82 - - -  05/12/14 2103 124/66 mmHg 98.2 F (36.8 C) Oral 75 18 95 % -  05/12/14 1750 122/79 mmHg - - - - - -  05/12/14 1319 136/66 mmHg 98.2 F (36.8 C) Oral 71 - - -    Filed Weights   05/11/14 0427 05/12/14 0500 05/13/14 0411  Weight: 163 lb 2.3 oz (74 kg) 155 lb (70.308 kg) 155 lb 8 oz (70.534 kg)    Hemodynamic parameters for last 24 hours:    Intake/Output from previous day: 05/28 0701 - 05/29 0700 In: 620 [P.O.:320; IV Piggyback:300] Out: -  Intake/Output this shift:    Scheduled Meds: . atorvastatin  10 mg Oral q1800  . bisacodyl  10 mg Oral Daily  . diltiazem  30 mg Oral 4 times per day  . enoxaparin (LOVENOX) injection  1 mg/kg Subcutaneous Q12H  . fluticasone  2 spray Each Nare Daily  . ipratropium  0.5 mg Nebulization Q6H  . levalbuterol  0.63 mg Nebulization Q6H  . levothyroxine  50 mcg Oral QAC breakfast  . metoprolol tartrate  25 mg Oral BID  . montelukast  10 mg Oral QHS  . pantoprazole  40 mg Oral BID AC  . senna-docusate  1 tablet Oral QHS  . sertraline  100 mg Oral QHS  . sodium chloride  1 spray Each Nare 6 X Daily  . sodium chloride  3 mL Intravenous Q12H  . vancomycin  750 mg Intravenous Q12H   Continuous Infusions: . sodium chloride 10 mL/hr  (05/09/14 2340)   PRN Meds:.ALPRAZolam, ALPRAZolam, benzocaine, fentaNYL, guaifenesin, levalbuterol, metoprolol, ondansetron (ZOFRAN) IV, sodium chloride  General appearance: Alert, cooperative and fatigued Heart: Regular rate and rhythm Lungs: Coarse breath sounds (rhonchi) Abdomen: soft, non-tender; bowel sounds normal; no masses,  no organomegaly Wound:Clean, dry, and no signs of infection  Lab Results:   CBC:  Recent Labs  05/11/14 0640  WBC 15.1*  HGB 9.1*  HCT 25.8*  PLT 357   BMET: No results found for this basename: NA, K, CL, CO2, GLUCOSE, BUN, CREATININE, CALCIUM,  in the last 72 hours  PT/INR: No results found for this basename: LABPROT, INR,  in the last 72 hours   Radiology Dg Chest 2 View  05/11/2014   CLINICAL DATA:  Postop chest surgery, right-sided chest discomfort, shortness of breath, rattling sounds, history bronchitis, coronary artery disease, atrial fibrillation, hypertension  EXAM: CHEST  2 VIEW  COMPARISON:  05/11/2014  FINDINGS: Enlargement of cardiac silhouette with pulmonary vascular congestion.  Calcified mildly  tortuous thoracic aorta.  Persistent RIGHT pleural effusion and RIGHT lung infiltrates.  Interval removal of RIGHT subclavian line.  No pneumothorax.  Osseous structures unremarkable.  IMPRESSION: Persistent RIGHT pleural effusion and scattered RIGHT lung infiltrates.  Enlargement of cardiac silhouette with pulmonary vascular congestion.   Electronically Signed   By: Lavonia Dana M.D.   On: 05/11/2014 18:35     Assessment/Plan: S/P Procedure(s) (LRB): VIDEO BRONCHOSCOPY (N/A) VIDEO ASSISTED THORACOSCOPY (VATS)/EMPYEMA (Right) DECORTICATION (Right) PA/LAT CXR yesterday evening showed enlargement of cardiac silhouette, pulmonary vascular congestion, no pneumothorax, and persistent right pleural effusion and scattered infiltrates. Pulmonary/CCM to evaluate Appreciate Dr. Nona Dell inpatient evaluation. Cast to remain. Patient will follow up with  him in 2-3 weeks.  [name] Pinks PA-C  05/13/2014 7:54 AM  To SNF per pulmonary I have seen and examined [name] and agree with the above assessment  and plan.  [name] MD [facility] [phone] Office [phone] 05/13/2014 1:39 PM

## 2014-05-14 DIAGNOSIS — I10 Essential (primary) hypertension: Secondary | ICD-10-CM

## 2014-05-14 MED ORDER — ACETAMINOPHEN 325 MG PO TABS: 650.0000 mg | ORAL_TABLET | Freq: Four times a day (QID) | ORAL | Status: DC | PRN

## 2014-05-14 NOTE — Progress Notes (Signed)
On call physician paged for PO pain med.

## 2014-05-14 NOTE — Progress Notes (Addendum)
[address]       ,[place] [address]             [phone]                 11 Days Post-Op Procedure(s) (LRB): VIDEO BRONCHOSCOPY (N/A) VIDEO ASSISTED THORACOSCOPY (VATS)/EMPYEMA (Right) DECORTICATION (Right)  LOS: 15 days   Subjective: Patient states still "short winded" with exertion. She is able to expectorate sputum at times.  Objective: Vital signs in last 24 hours: Patient Vitals for the past 24 hrs:  BP Temp Temp src Pulse Resp SpO2 Weight  05/14/14 0744 - - - - - 97 % -  05/14/14 0455 - - - - - 100 % -  05/14/14 0434 - - - - - 83 % -  05/14/14 0430 102/71 mmHg 97.3 F (36.3 C) Oral 100 16 77 % 159 lb 14.4 oz (72.53 kg)  05/14/14 0207 - - - 72 18 96 % -  05/13/14 2016 - - - - - 95 % -  05/13/14 2015 - - - - - 95 % -  05/13/14 1954 128/76 mmHg 97.4 F (36.3 C) Oral 78 18 99 % -  05/13/14 1947 - - - 77 18 99 % -  05/13/14 1731 126/67 mmHg - - - - - -  05/13/14 1353 147/74 mmHg 97.3 F (36.3 C) Oral 72 18 98 % -  05/13/14 1256 139/81 mmHg - - - - - -  05/13/14 1015 118/72 mmHg - - - - - -    Filed Weights   05/12/14 0500 05/13/14 0411 05/14/14 0430  Weight: 155 lb (70.308 kg) 155 lb 8 oz (70.534 kg) 159 lb 14.4 oz (72.53 kg)    Intake/Output from previous day: 05/29 0701 - 05/30 0700 In: 355 [P.O.:355] Out: -  Intake/Output this shift:    Scheduled Meds: . atorvastatin  10 mg Oral q1800  . bisacodyl  10 mg Oral Daily  . diltiazem  30 mg Oral 4 times per day  . enoxaparin (LOVENOX) injection  1 mg/kg Subcutaneous Q12H  . fluticasone  2 spray Each Nare Daily  . ipratropium  0.5 mg Nebulization Q6H  . levalbuterol  0.63 mg Nebulization Q6H  . levothyroxine  50 mcg Oral QAC breakfast  . metoprolol tartrate  25 mg Oral BID  . montelukast  10 mg Oral QHS  . pantoprazole  40 mg Oral BID AC  . predniSONE  20 mg Oral Q breakfast  . senna-docusate  1 tablet Oral QHS  . sertraline  100 mg Oral QHS  . sodium chloride  1 spray Each  Nare 6 X Daily  . sodium chloride  3 mL Intravenous Q12H   Continuous Infusions: . sodium chloride 10 mL/hr (05/09/14 2340)   PRN Meds:.acetaminophen, ALPRAZolam, ALPRAZolam, benzocaine, fentaNYL, guaifenesin, levalbuterol, metoprolol, ondansetron (ZOFRAN) IV, ondansetron, sodium chloride  General appearance: Alert, cooperative and fatigued Heart: Regular rate and rhythm Lungs: Clear on the left and diminished at right base Abdomen: soft, non-tender; bowel sounds normal; no masses,  no organomegaly Wound:Clean, dry, and no signs of infection  Lab Results:   CBC:  Recent Labs  05/13/14 0844  WBC 16.4*  HGB 11.1*  HCT 30.2*  PLT 289   BMET: No results found for this basename: NA, K, CL, CO2, GLUCOSE, BUN, CREATININE, CALCIUM,  in the last 72 hours  PT/INR: No results found for this basename: LABPROT, INR,  in the last 72 hours  Radiology No results found.   Assessment/Plan: S/P Procedure(s) (LRB): VIDEO BRONCHOSCOPY (N/A) VIDEO ASSISTED THORACOSCOPY (VATS)/EMPYEMA (Right) DECORTICATION (Right)  Pulmonary/CCM to evaluate. Discharge to SNF per pulmonary when appropriate.  Lars Pinks PA-C  05/14/2014 9:36 AM  Patient seen and examined, agree with above. OK for dc to rehab from our standpoint

## 2014-05-14 NOTE — Progress Notes (Signed)
PULMONARY / CRITICAL CARE MEDICINE   Name: [name] MRN: [id number] DOB: 1936/09/04    ADMISSION DATE:  04/29/2014 CONSULTATION DATE:  5/16   REFERRING MD :  [facility]  PRIMARY SERVICE:  PCCM   CHIEF COMPLAINT:  RLL PNA/Effusion [name]   BRIEF PATIENT DESCRIPTION:  14 yoWF NHR remote smoker admitted to [facility] 5/15 for acute resp failure for RLL PNA , pleural effusion and atrial fib with rVR .Transfered to [facility] ICU 5/16 d/t worsening resp status , PCCM   Adm   RA office pt w/ RAD (nml spiro 2014 )  Recent adm to rehab after ankle surgery 4/16-4/20 [facility]  SIGNIFICANT EVENTS / STUDIES:  5/15 CT chest >neg for PE , RLL consolidation/mod effusion  5/16 transfer from [facility] to [facility] ICU  5/19 VATS drainange of rt empyema  LINES / TUBES:  Right pig tail via IR 5/18/ VATS with CT chest tube 5/19 >>> all tubes out  5/23   CULTURES: 5/15 BC x 2 >>ng RVP 5/16 >> para influenza Pleural 5/19>>>ng Tissue 5/19 pleural peel >> MRSA Bronchial Washings 5/19> MSSA     ANTIBIOTICS: 5/15 Vanc >>> 5/28 5/15 Primaxin > 5/24  SUBJECTIVE:    nad at rest  VITAL SIGNS: Temp:  [97.3 F (36.3 C)-97.4 F (36.3 C)] 97.3 F (36.3 C) (05/30 0430) Pulse Rate:  [72-100] 96 (05/30 1037) Resp:  [16-18] 16 (05/30 0430) BP: (102-147)/(67-81) 108/68 mmHg (05/30 1037) SpO2:  [77 %-100 %] 97 % (05/30 0744) Weight:  [159 lb 14.4 oz (72.53 kg)] 159 lb 14.4 oz (72.53 kg) (05/30 0430) FIO2:  2lpm NP    INTAKE / OUTPUT:  Intake/Output Summary (Last 24 hours) at 05/14/14 1115 Last data filed at 05/13/14 2003  Gross per 24 hour  Intake    355 ml  Output      0 ml  Net    355 ml     PHYSICAL EXAMINATION: General: chronically ill appearing/ no respiratory distress.   Neuro:  A/o x 3   HEENT:  Intact  Cardiovascular:  RRR , no m/r/g  Lungs: diminished bs in bases , R chest tube site dressing is dry/ some pseudowheeze Abdomen:  Soft nt , bs + Musculoskeletal:  Intact . RLE distal in cast Skin:   Intact   LABS:  CBC  Recent Labs Lab 05/10/14 0430 05/11/14 0640 05/13/14 0844  WBC 20.0* 15.1* 16.4*  HGB 9.8* 9.1* 11.1*  HCT 27.7* 25.8* 30.2*  PLT 326 357 289   Coag's No results found for this basename: APTT, INR,  in the last 168 hours BMET  Recent Labs Lab 05/08/14 0615  NA 133*  K 3.6*  CL 98  CO2 23  BUN 8  CREATININE 0.50  GLUCOSE 87   Electrolytes  Recent Labs Lab 05/08/14 0615  CALCIUM 6.6*   Sepsis Markers No results found for this basename: LATICACIDVEN, PROCALCITON, O2SATVEN,  in the last 168 hours ABG No results found for this basename: PHART, PCO2ART, PO2ART,  in the last 168 hours Liver Enzymes No results found for this basename: AST, ALT, ALKPHOS, BILITOT, ALBUMIN,  in the last 168 hours Cardiac Enzymes No results found for this basename: TROPONINI, PROBNP,  in the last 168 hours Glucose  Recent Labs Lab 05/09/14 0844  GLUCAP 100*    Imaging No results found.    ASSESSMENT / PLAN:  PULMONARY A: Acute Respiratory failure, Hypoxic; Multifactorial secondary to PNA, Asthma exacerbation, Heart Failure. /pleural effusion -empyema s/p  VATS 5/19   - has component of VCD  Asthma  Hemoptysis on lovenox after removal of chest tube.  P:     - Cont Nebs ipratropium, Xopenex.  - max rx for GERD added 5/25 and advised no mint/menthol products     CARDIOVASCULAR A: Atrial Fib w/ RVR >now in NSR  CHF  Duplex LE for DVT negative,CT Angio neg for PE  P:  - Cont diuresis -  lovenox ,resume xarelto on dc - Cont metoprolol  prn  -  ct cardizem PO  RENAL A:  Hyponatremia  hypokalemia P:    - Replace electrolytes as indicated.    GASTROINTESTINAL A:  GERD  P:   - PPI. -dys 3 diet  HEMATOLOGIC A:  Leucocytosis P:  - Follow H/H.  INFECTIOUS A:  RLL PNA MRSA empyema  P:   - - abx per dashboard/ dc'd primaxin 5/25 as no indication      ENDOCRINE A:  Hyperglycemia  Hypothyroidism Lab Results  Component Value Date    TSH 0.521 04/30/2014     P:   - SSI.  NEUROLOGIC A:  Anxiety /Agitation / Baseline fibromylagia - on xanax, oxycodone, zoloft, ultram at baseline P:   - use fentanyl for pain prn. - Low dose xanax if needed for anxiety. - Holding oxycodone, zoloft and ultram for now.    MSK A: RLE in cast from recent surgery    [place] for discharge to [place], MD Pulmonary and [place] [phone] After 5:30 PM or weekends, call [phone]

## 2014-05-14 NOTE — Progress Notes (Signed)
Report given to [facility] [place]. 

## 2014-05-14 NOTE — Progress Notes (Signed)
Pt d/c to [facility] in [place] by ambulance.  Family aware of discharge.  No further CSW needs.

## 2014-05-14 NOTE — Discharge Summary (Addendum)
Physician Discharge Summary       Patient ID: [name] MRN: [id number] DOB/AGE: 1936-10-03 78 y.o.  Admit date: 04/29/2014 Discharge date: 05/14/2014  Discharge Diagnoses:  Acute respiratory hypoxic failure  Pneumonia  MRSA Empyema s/p VATS 5/19  Asthmatic exacerbation  Reactive airway disease  Vocal cord dysfunction  Hemoptysis  Cough  Atrial fibrillation  Heart failure  GERD  Non-union of right ankle   Detailed Hospital Course:  [name] is an 78 y.o. female former smoker Followed by Dr [name] in [place] office as recently as dec 2014 for reacitive airways (sinus v gerd). She presented to the ER at [facility] 04/29/2014 with persistent shortness of breath not responding to neb tx. She denied substernal Chest pain, but had some pain in the right posterior ribs. She has no fever, chills, nausea or vomiting. Evaluation in the ER included a CXR which showed vasuclar congestion and interstitial edema, BNP of 1774, negative troponin, and EKG showed afib with RVR of 140. She was given NTG, IV Lasix, and 10mg  of Cardiazem IV. HR improved w/ controlled rate. CT chest neg for PE, mod right pleural effusion ? Loculated, RLL consolidation. Pt was admitted to [facility], no sign improvement in resp status 5/16 with transfer to [facility] ICU for PCCM to admit with working diagnosis of Right lower lobe pneumonia with loculated pleural effusion and worsening respiratory failure. She was started on Vanc and Primaxin . Requiring NRB to keep sats >90%. Thoracic surgery was consulted. Had CT guided 14 fr chest tube placed on 5/18. This did not drain adequately so she ended up going to OR for right VATS on 5/19 by Dr [name]. Cultures from VATS came back as positive for MRSA. Antibiotics were narrowed to vancomycin mono-coverage and course has been completed. All chest tubes were removed 5/23. Oxygen was weaned to 2 liters. Efforts were focused on rehab and conditioning. Her course was complicated by persistent cough and upper  airway wheezing consistent with upper exacerbation of her VCD. We maximized GERD treatment and also asked ENT to evaluate her. She was seen by Dr [name] who did a flexible laryngoscopy at the bedside. The findings were consistent with diffuse inflammation of the vocal cords and supraglottic larynx. In addition the the therapies we had already added [name] added nasal saline washes. She improved to point that she was cleared for d/c as of 5/27 from medical stand-point but thoracic surgery wanted to keep her here another day. We did add low dose steroids for upper airway cough. Also asked ortho to see her as she was to see Dr [name] in the office on 29th. [name] saw her and said that she can bear weight in the cast and needs to f/u with him in 2-3 weeks. She was clear for d/c as of 5/29. And d/c to SNF on 5/30.    Discharge Plan by diagnoses   Acute Hypoxic Respiratory failure: Multifactorial secondary to PNA, Asthma exacerbation, Heart Failure, MRSA empyema s/p VATS 0000000, complicated by element of Vocal cord dysfunction and cough  Plan :  - O2 to keep sat >90%  - Continue nebs - atrovent, xopenex.  - Max rx for GERD added 5/25 and advised no mint/menthol products  -ENT has recommended addition of nasal saline spray and nasal steroid  - Continue guaifenesin.  - Vanc completed  - Dys 3 softer texture to decrease dry/crumbly textures with dysphonia and thin liquids and continuation of esophageal precautions  -f/u thoracic surgery appointment made  -f/u  dr [name] June 29th at 11am  -prednisone slow taper, start 20mg  daily for 10 days, then 10mg  daily until seen by [place] in office.    Atrial Fib w/ RVR > now in NSR  CHF  Plan:  - Cont diuresis  - resume xarelto on dc  - Cont cardizem PO and low dose beta-blocker   GERD  Plan:  - PPI.   Hypothyroidism  Plan:  - Continue synthroid.   Anxiety /Agitation / Baseline fibromylagia - on xanax, oxycodone, zoloft, ultram at baseline  Plan:  - Low dose xanax if  needed for anxiety.  - cont zoloft   RLE in cast from recent ankle surgery  Plan:  - PT recommending SNF placement.  - wt bearing ok w/ transfer  - f/u w/ [facility] 2-3 weeks     Significant Hospital tests/ studies/ interventions and procedures  Consults  Cardiothoracic surgery, ortho and ENT   LINES / TUBES:  Right pig tail via IR 5/18/ VATS with CT chest tube 5/19 >>> all tubes out 5/23   CULTURES:  5/15 BC x 2 >>ng  RVP 5/16 >> para influenza  Pleural 5/19>>>ng  Tissue 5/19 pleural peel >> MRSA  Bronchial Washings 5/19> MSSA   ANTIBIOTICS:  5/15 Vanc >>> 5/27  5/15 Primaxin > 5/25   Discharge Exam: BP 110/70  Pulse 96  Temp(Src) 97.3 F (36.3 C) (Oral)  Resp 16  Ht 4\' 11"  (1.499 m)  Wt 72.53 kg (159 lb 14.4 oz)  BMI 32.28 kg/m2  SpO2 97%  General: chronically ill appearing/ no respiratory distress.  Neuro: A/o x 3  HEENT: Intact  Cardiovascular: RRR , no m/r/g  Lungs: diminished bs in bases , R chest tube site dressing is dry/ some pseudowheeze  Abdomen: Soft nt , bs +  Musculoskeletal: Intact . RLE distal in cast  Skin: Intact   Labs at discharge Lab Results  Component Value Date   CREATININE 0.50 05/08/2014   BUN 8 05/08/2014   NA 133* 05/08/2014   K 3.6* 05/08/2014   CL 98 05/08/2014   CO2 23 05/08/2014   Lab Results  Component Value Date   WBC 16.4* 05/13/2014   HGB 11.1* 05/13/2014   HCT 30.2* 05/13/2014   MCV 89.9 05/13/2014   PLT 289 05/13/2014   Lab Results  Component Value Date   ALT 19 05/05/2014   AST 29 05/05/2014   ALKPHOS 103 05/05/2014   BILITOT 0.6 05/05/2014   Lab Results  Component Value Date   INR 1.12 05/02/2014   INR 3.0 03/17/2013   INR 1.0 05/03/2009    Current radiology studies No results found.  Disposition:  03-Skilled Nursing Facility      Discharge Instructions   Diet - low sodium heart healthy    Complete by:  As directed   Dys 3 softer texture to decrease dry/crumbly textures     Discharge to SNF when bed  available    Complete by:  As directed      Increase activity slowly    Complete by:  As directed   Non-weight bearing right lower extremity     Weight bearing as tolerated    Complete by:  As directed   In the cast with cast shoe for transfers.  Laterality:  right  Extremity:  Lower            Medication List    STOP taking these medications       albuterol (2.5 MG/3ML) 0.083% nebulizer solution  Commonly known as:  PROVENTIL     albuterol 108 (90 BASE) MCG/ACT inhaler  Commonly known as:  PROVENTIL HFA;VENTOLIN HFA     cyclobenzaprine 10 MG tablet  Commonly known as:  FLEXERIL     dicyclomine 10 MG capsule  Commonly known as:  BENTYL     isosorbide mononitrate 60 MG 24 hr tablet  Commonly known as:  IMDUR     levofloxacin 500 MG tablet  Commonly known as:  LEVAQUIN     methylPREDNISolone sodium succinate 40 mg/mL injection  Commonly known as:  SOLU-MEDROL     traMADol 50 MG tablet  Commonly known as:  ULTRAM     VITAMIN E PO      TAKE these medications       acetaminophen 325 MG tablet  Commonly known as:  TYLENOL  Take 650 mg by mouth every 6 (six) hours as needed. For pain/fever     acetaminophen 325 MG tablet  Commonly known as:  TYLENOL  Take 2 tablets (650 mg total) by mouth every 6 (six) hours as needed for mild pain or moderate pain.     ALPRAZolam 0.5 MG tablet  Commonly known as:  XANAX  Take 1 mg by mouth at bedtime as needed for anxiety.     ALPRAZolam 0.25 MG tablet  Commonly known as:  XANAX  Take 1 tablet (0.25 mg total) by mouth 2 (two) times daily as needed for anxiety.     bisacodyl 5 MG EC tablet  Commonly known as:  DULCOLAX  Take 2 tablets (10 mg total) by mouth daily.     CALCIUM 600+D 600-400 MG-UNIT per tablet  Generic drug:  Calcium Carbonate-Vitamin D  Take 1 tablet by mouth daily at 6 PM.     cholecalciferol 1000 UNITS tablet  Commonly known as:  VITAMIN D  Take 1,000 Units by mouth 2 (two) times daily with breakfast  and lunch.     denosumab 60 MG/ML Soln injection  Commonly known as:  PROLIA  Inject 60 mg into the skin every 6 (six) months. Administer in upper arm, thigh, or abdomen     dextromethorphan-guaiFENesin 30-600 MG per 12 hr tablet  Commonly known as:  MUCINEX DM  Take 1 tablet by mouth every 12 (twelve) hours. scheduled     diltiazem 30 MG tablet  Commonly known as:  CARDIZEM  Take 1 tablet (30 mg total) by mouth every 6 (six) hours.     fluticasone 50 MCG/ACT nasal spray  Commonly known as:  FLONASE  Place 2 sprays into both nostrils daily.     ipratropium 0.02 % nebulizer solution  Commonly known as:  ATROVENT  Take 2.5 mLs (0.5 mg total) by nebulization every 6 (six) hours.     levalbuterol 0.63 MG/3ML nebulizer solution  Commonly known as:  XOPENEX  Take 3 mLs (0.63 mg total) by nebulization every 2 (two) hours as needed for wheezing or shortness of breath.     levalbuterol 0.63 MG/3ML nebulizer solution  Commonly known as:  XOPENEX  Take 3 mLs (0.63 mg total) by nebulization every 6 (six) hours.     levothyroxine 25 MCG tablet  Commonly known as:  SYNTHROID, LEVOTHROID  Take 50 mcg by mouth daily before breakfast.     loratadine 10 MG tablet  Commonly known as:  CLARITIN  Take 10 mg by mouth 2 (two) times daily.     metoprolol 50 MG tablet  Commonly known as:  LOPRESSOR  Take 50  mg by mouth 2 (two) times daily.     montelukast 10 MG tablet  Commonly known as:  SINGULAIR  Take 10 mg by mouth at bedtime.     multivitamin with minerals Tabs tablet  Take 1 tablet by mouth daily.     nitroGLYCERIN 0.4 MG SL tablet  Commonly known as:  NITROSTAT  Place 0.4 mg under the tongue every 5 (five) minutes as needed. For chest pain     omega-3 acid ethyl esters 1 G capsule  Commonly known as:  LOVAZA  Take 2 g by mouth 2 (two) times daily.     omeprazole-sodium bicarbonate 40-1100 MG per capsule  Commonly known as:  ZEGERID  Take 1 capsule by mouth daily before  breakfast.     ondansetron 4 MG tablet  Commonly known as:  ZOFRAN  Take 4 mg by mouth every 8 (eight) hours as needed for nausea or vomiting.     oxyCODONE 5 MG immediate release tablet  Commonly known as:  Oxy IR/ROXICODONE  Take one tablet by mouth every 4 hours as needed for moderate to severe pain     predniSONE 10 MG tablet  Commonly known as:  DELTASONE  Prednisone 10 mg , take 2 tabs x 10 days , then decrease to 1 tablet daily and stay at that dose.     rivaroxaban 20 MG Tabs tablet  Commonly known as:  XARELTO  Take 1 tablet (20 mg total) by mouth daily with supper.     rosuvastatin 20 MG tablet  Commonly known as:  CRESTOR  Take 20 mg by mouth at bedtime.     sertraline 100 MG tablet  Commonly known as:  ZOLOFT  Take 100 mg by mouth at bedtime.     sodium chloride 0.65 % Soln nasal spray  Commonly known as:  OCEAN  Place 1 spray into both nostrils 6 (six) times daily.     VITAMIN C PO  Take 1 tablet by mouth every morning.       Follow-up Information      Follow up with Rigoberto Noel., MD On 06/13/2014. (11am )    Specialty:  Pulmonary Disease   Contact information:   [address] [place] [address] [phone]       Follow up with [name], MD On 06/09/2014. (Have a chest x-ray at [facility] at 3:00 pm, then see MD at 4:00 pm)    Specialty:  Cardiothoracic Surgery   Contact information:   [address] [place] [address] [phone]       Follow up with Wylene Simmer, MD In 2 weeks.   Specialty:  Orthopedic Surgery   Contact information:   [address] [place] [address] [phone]       Follow up with [facility], NP On 06/06/2014. (Tammy Parret ANP-BC 10 am)    Specialty:  Nurse Practitioner   Contact information:   [place] [place] [address] [phone]       Discharged Condition: good   Signed: Erick Colace 05/14/2014, 1:09 PM   Physician Statement:    The Patient was personally examined, the discharge assessment and plan has been personally reviewed and I agree with ACNP Babcock's assessment and plan. > 30 minutes of time have been dedicated to discharge assessment, planning and discharge instructions.  Please see today's progress notes also    Christinia Gully, MD Pulmonary and [facility] [phone]  After 5:30 PM or weekends, call [phone]

## 2014-05-14 NOTE — Progress Notes (Signed)
[name] to be D/C'd Skilled nursing facility per MD order.  Discussed with the patient and all questions fully answered.    Medication List    STOP taking these medications       albuterol (2.5 MG/3ML) 0.083% nebulizer solution  Commonly known as:  PROVENTIL     albuterol 108 (90 BASE) MCG/ACT inhaler  Commonly known as:  PROVENTIL HFA;VENTOLIN HFA     cyclobenzaprine 10 MG tablet  Commonly known as:  FLEXERIL     dicyclomine 10 MG capsule  Commonly known as:  BENTYL     isosorbide mononitrate 60 MG 24 hr tablet  Commonly known as:  IMDUR     levofloxacin 500 MG tablet  Commonly known as:  LEVAQUIN     methylPREDNISolone sodium succinate 40 mg/mL injection  Commonly known as:  SOLU-MEDROL     traMADol 50 MG tablet  Commonly known as:  ULTRAM     VITAMIN E PO      TAKE these medications       acetaminophen 325 MG tablet  Commonly known as:  TYLENOL  Take 650 mg by mouth every 6 (six) hours as needed. For pain/fever     acetaminophen 325 MG tablet  Commonly known as:  TYLENOL  Take 2 tablets (650 mg total) by mouth every 6 (six) hours as needed for mild pain or moderate pain.     ALPRAZolam 0.5 MG tablet  Commonly known as:  XANAX  Take 1 mg by mouth at bedtime as needed for anxiety.     ALPRAZolam 0.25 MG tablet  Commonly known as:  XANAX  Take 1 tablet (0.25 mg total) by mouth 2 (two) times daily as needed for anxiety.     bisacodyl 5 MG EC tablet  Commonly known as:  DULCOLAX  Take 2 tablets (10 mg total) by mouth daily.     CALCIUM 600+D 600-400 MG-UNIT per tablet  Generic drug:  Calcium Carbonate-Vitamin D  Take 1 tablet by mouth daily at 6 PM.     cholecalciferol 1000 UNITS tablet  Commonly known as:  VITAMIN D  Take 1,000 Units by mouth 2 (two) times daily with breakfast and lunch.     denosumab 60 MG/ML Soln injection  Commonly known as:  PROLIA  Inject 60 mg into the skin every 6 (six) months. Administer in upper arm, thigh, or abdomen     dextromethorphan-guaiFENesin 30-600 MG per 12 hr tablet  Commonly known as:  MUCINEX DM  Take 1 tablet by mouth every 12 (twelve) hours. scheduled     diltiazem 30 MG tablet  Commonly known as:  CARDIZEM  Take 1 tablet (30 mg total) by mouth every 6 (six) hours.     fluticasone 50 MCG/ACT nasal spray  Commonly known as:  FLONASE  Place 2 sprays into both nostrils daily.     ipratropium 0.02 % nebulizer solution  Commonly known as:  ATROVENT  Take 2.5 mLs (0.5 mg total) by nebulization every 6 (six) hours.     levalbuterol 0.63 MG/3ML nebulizer solution  Commonly known as:  XOPENEX  Take 3 mLs (0.63 mg total) by nebulization every 2 (two) hours as needed for wheezing or shortness of breath.     levalbuterol 0.63 MG/3ML nebulizer solution  Commonly known as:  XOPENEX  Take 3 mLs (0.63 mg total) by nebulization every 6 (six) hours.     levothyroxine 25 MCG tablet  Commonly known as:  SYNTHROID, LEVOTHROID  Take 50 mcg  by mouth daily before breakfast.     loratadine 10 MG tablet  Commonly known as:  CLARITIN  Take 10 mg by mouth 2 (two) times daily.     metoprolol 50 MG tablet  Commonly known as:  LOPRESSOR  Take 50 mg by mouth 2 (two) times daily.     montelukast 10 MG tablet  Commonly known as:  SINGULAIR  Take 10 mg by mouth at bedtime.     multivitamin with minerals Tabs tablet  Take 1 tablet by mouth daily.     nitroGLYCERIN 0.4 MG SL tablet  Commonly known as:  NITROSTAT  Place 0.4 mg under the tongue every 5 (five) minutes as needed. For chest pain     omega-3 acid ethyl esters 1 G capsule  Commonly known as:  LOVAZA  Take 2 g by mouth 2 (two) times daily.     omeprazole-sodium bicarbonate 40-1100 MG per capsule  Commonly known as:  ZEGERID  Take 1 capsule by mouth daily before breakfast.     ondansetron 4 MG tablet  Commonly known as:  ZOFRAN  Take 4 mg by mouth every 8 (eight) hours as needed for nausea or vomiting.     oxyCODONE 5 MG immediate  release tablet  Commonly known as:  Oxy IR/ROXICODONE  Take one tablet by mouth every 4 hours as needed for moderate to severe pain     predniSONE 10 MG tablet  Commonly known as:  DELTASONE  Prednisone 10 mg , take 2 tabs x 10 days , then decrease to 1 tablet daily and stay at that dose.     rivaroxaban 20 MG Tabs tablet  Commonly known as:  XARELTO  Take 1 tablet (20 mg total) by mouth daily with supper.     rosuvastatin 20 MG tablet  Commonly known as:  CRESTOR  Take 20 mg by mouth at bedtime.     sertraline 100 MG tablet  Commonly known as:  ZOLOFT  Take 100 mg by mouth at bedtime.     sodium chloride 0.65 % Soln nasal spray  Commonly known as:  OCEAN  Place 1 spray into both nostrils 6 (six) times daily.     VITAMIN C PO  Take 1 tablet by mouth every morning.        VVS, Skin clean, dry and intact without evidence of skin break down, no evidence of skin tears noted.   Transported via EMS to [facility] 05/14/2014 4:45 PM

## 2014-05-17 ENCOUNTER — Ambulatory Visit: Payer: Medicare HMO | Admitting: Family Medicine

## 2014-05-23 ENCOUNTER — Other Ambulatory Visit: Payer: Self-pay | Admitting: Family Medicine

## 2014-05-24 NOTE — Telephone Encounter (Signed)
Patient last seen in office on 01-13-14. Rx last filled on 04-20-14 for #60. Please advise. If approved please route to Pool A so nurse can phone in to pharmacy

## 2014-05-24 NOTE — Telephone Encounter (Signed)
Called into [facility] per [employer]

## 2014-05-24 NOTE — Telephone Encounter (Signed)
This is okay x1 

## 2014-05-31 ENCOUNTER — Encounter: Payer: Self-pay | Admitting: *Deleted

## 2014-05-31 LAB — FUNGUS CULTURE W SMEAR: Fungal Smear: NONE SEEN

## 2014-06-02 ENCOUNTER — Ambulatory Visit: Payer: Medicare HMO | Admitting: Cardiothoracic Surgery

## 2014-06-06 ENCOUNTER — Other Ambulatory Visit: Payer: Self-pay | Admitting: Cardiothoracic Surgery

## 2014-06-06 ENCOUNTER — Encounter: Payer: Self-pay | Admitting: Adult Health

## 2014-06-06 ENCOUNTER — Ambulatory Visit (INDEPENDENT_AMBULATORY_CARE_PROVIDER_SITE_OTHER)
Admission: RE | Admit: 2014-06-06 | Discharge: 2014-06-06 | Disposition: A | Discharge status: Home / Self Care | Payer: Medicare Other | Source: Ambulatory Visit | Attending: Adult Health | Admitting: Adult Health

## 2014-06-06 ENCOUNTER — Ambulatory Visit (INDEPENDENT_AMBULATORY_CARE_PROVIDER_SITE_OTHER): Payer: Medicare Other | Admitting: Adult Health

## 2014-06-06 VITALS — BP 138/80 | HR 77 | Temp 97.8°F | Ht 59.0 in | Wt 141.8 lb

## 2014-06-06 DIAGNOSIS — J869 Pyothorax without fistula: Secondary | ICD-10-CM

## 2014-06-06 DIAGNOSIS — J45909 Unspecified asthma, uncomplicated: Secondary | ICD-10-CM

## 2014-06-06 DIAGNOSIS — R042 Hemoptysis: Secondary | ICD-10-CM

## 2014-06-06 DIAGNOSIS — J9601 Acute respiratory failure with hypoxia: Secondary | ICD-10-CM

## 2014-06-06 MED ORDER — PREDNISONE 5 MG PO TABS: ORAL_TABLET | ORAL | Status: DC

## 2014-06-06 NOTE — Patient Instructions (Addendum)
Take prednisone 5mg  daily for 5 days then 1/2 daily for 5 days and stop.  Use Albuterol Neb every 6hrs as needed  May resume Budesonide Neb Twice daily  , rinse after use.  Please contact office for sooner follow up if symptoms do not improve or worsen or seek emergency care  Follow up Dr. Elsworth Soho  In 4 weeks and As needed

## 2014-06-06 NOTE — Progress Notes (Signed)
Subjective:    Patient ID: [name], female    DOB: 24-Oct-1936, 78 y.o.   MRN: [id number]  HPI  77/F remote ex smoker with reactive airway disease - ? GERD vs sinusitis, nml pFTs  She smoked a PPD x 20-25 yrs before quitting in 1990. She develops recurrent chest colds requiring several rounds of Abx to get better.   Data:  SHe underwent esophageal dilation for hypertensive LES (Dr Olevia Perches) & is on protonix two times a day . Evaluation for chest pain in 6/10 (Dr Percival Spanish) -no obstructive CAD - attributed to esophageal spasm.Sucralfate added by GI to zegerid .  Took macrodantin x 2 yrs for UTIs , stopped '10  Prednisone makes her hyper and nervous. Has osteoporosis with fractures.  PFTs nml '11 .  RAST - IgE 225 -high but no sensitivity to common indoor/ outdoor allergens  Underwent endoscopic sinus surgery 1/12 by dr Constance Holster  Treated x 6 wks with clinda for chronic sinusitis, started wheezing again when ABx stopped   06/24/2013  2 y FU -  Required abx in may 2014  Reconstructive surgery of RLE  Pt c/o bronchitis x 2 weeks. C/o cough w/ yellow-green tint, wheezing, chest tx, increase SOB.   symbicort was stopped 2012  CXR 4/13 nml  Spirometry -no obstruction  >>omnicef, pulmicort MDI   07/20/13 stopped pulmicort herself due to side effects  >>doxycycline x 10 d   11/15/2013 Acute OV  last week was called in Levaquin and prednisone taper.   12/30/13  going on for several months -maybe since 06/2013 with intermittent relief with abx & prednisone.  Pt was seen by Dr. Constance Holster from ENT and was told that everything checked out ok and was told that this may be related to her reflux problem. She  has appt with Dr. Henrene Pastor with GI on 12/29/13. Given another round of  prednisone . She is having cough with white sputum and wheezing.  Does have intermittent reflux esp at night.  Complains that cough is keeping her up at night.   06/06/2014 [facility] follow up  Patient presents for a post  hospital followup for a prolonged hospitalization with rehabilitation stay. Patient was admitted May 15 for acute hypoxic respiratory failure, secondary to pneumonia, asthma, exacerbation, congestive heart failure, and MRSA empyema requiring vats on May 19, complicated by an element of vocal cord dysfunction, and chronic cough. Patient had a slow to resolve right lower lobe pneumonia with a loculated pleural effusion and worsening respiratory failure despite aggressive antibiotics. Patient was seen by thoracic surgery. A chest tube was placed. However, did not drain well. Patient ended up going to surgery for VATS .  Cultures returned back for positive MRSA. Patient did have upper airway wheezing, consistent with vocal cord dysfunction. She was seen by ENT, who did a laryngoscopy at the bedside. That was consistent with diffuse inflammation of the vocal cords, and supraglottic larynx. Her hospital stay. Was, complicated by atrial fib  with RVR.   She was discharged to a rehabilitation center for physical therapy. Since discharge she is improving.  Reports breathing is doing well tho she does mention some fatigue/weakness. Cough is much better.  Is now home from rehab.  Has not restarted her Pulmiocrt neb .  On pred 10mg  daily  CXR today shows improved aeration w/ improving infiltrate /effusion on right    Past Medical History  Diagnosis Date  . GERD (gastroesophageal reflux disease)   . Anxiety disorder   .  Arthritis   . CAD (coronary artery disease)     Cath May 2010.  Nonbstructive  . Depression   . Hypothyroid   . Asthmatic bronchitis   . Hypertension     dr Percival Spanish  . OA (osteoarthritis)   . Chronic bronchitis   . 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 Encephalitis     d/t meningitis  . PONV (postoperative nausea and vomiting)     history of cardiac arrest day 1 post surgery  in 2008  . Esophageal motility disorder   . Atrial fibrillation   .  Hyperlipidemia   . Fibromyalgia   . Vitamin D deficiency   . Status post dilation of esophageal narrowing   . Bowel obstruction     blockage    Review of Systems  neg for any significant sore throat, dysphagia, itching, sneezing, nasal congestion or excess/ purulent secretions, fever, chills, sweats, unintended wt loss, pleuritic or exertional cp, hempoptysis, orthopnea pnd or change in chronic leg swelling. Also denies presyncope, palpitations, heartburn, abdominal pain, nausea, vomiting, diarrhea or change in bowel or urinary habits, dysuria,hematuria, rash, arthralgias, visual complaints, headache, numbness weakness or ataxia.     Objective:   Physical Exam   Gen. Pleasant, obese, in no distress, normal affect ENT - no lesions, no post nasal drip, class 2-3 airway Neck: No JVD, no thyromegaly, no carotid bruits Lungs: no use of accessory muscles, no dullness to percussion , decreased BS in bases   Cardiovascular: Rhythm regular, heart sounds  normal, no murmurs or gallops, no peripheral edema Abdomen: soft and non-tender, no hepatosplenomegaly, BS normal. Musculoskeletal: No deformities, no cyanosis or clubbing Neuro:  alert, non focal, no tremors       Assessment & Plan:

## 2014-06-07 ENCOUNTER — Telehealth: Payer: Self-pay | Admitting: Cardiology

## 2014-06-07 ENCOUNTER — Telehealth: Payer: Self-pay | Admitting: Family Medicine

## 2014-06-07 NOTE — Telephone Encounter (Signed)
New Message  Pt called states that she was taken off of isosorbide mononitrate 60 MG 24 hr tablet and was placed on an alternate medication. Pt is not sure of the name of the medication. (unable to research further). Pt states that when she was discharged from the hospital they did not give her a script to have the new medication filled and she states that she is not taking any medication at this moment. Pt requests a call back to discuss further.

## 2014-06-07 NOTE — Telephone Encounter (Signed)
FYI

## 2014-06-07 NOTE — Telephone Encounter (Signed)
According to hospital dc summary after her VATS, pt was sent home on diltiazem every 6 hours. Isosorbide was discontinued. She does not have refills for the diltiazem.  Home care nurse was at her home when I called patient. Her HR is 88 and regular, BP is 120/82.  Will forward to Dr. Percival Spanish for advice. Pt using [employer].

## 2014-06-07 NOTE — Telephone Encounter (Signed)
Ok- call if needed

## 2014-06-09 ENCOUNTER — Ambulatory Visit
Admission: RE | Admit: 2014-06-09 | Discharge: 2014-06-09 | Disposition: A | Discharge status: Home / Self Care | Payer: Medicare Other | Source: Ambulatory Visit | Attending: Cardiothoracic Surgery | Admitting: Cardiothoracic Surgery

## 2014-06-09 ENCOUNTER — Encounter: Payer: Self-pay | Admitting: Cardiothoracic Surgery

## 2014-06-09 ENCOUNTER — Ambulatory Visit (INDEPENDENT_AMBULATORY_CARE_PROVIDER_SITE_OTHER): Payer: Medicare Other | Admitting: Cardiothoracic Surgery

## 2014-06-09 VITALS — BP 188/94 | HR 74 | Resp 18 | Ht 59.0 in | Wt 141.0 lb

## 2014-06-09 DIAGNOSIS — A4902 Methicillin resistant Staphylococcus aureus infection, unspecified site: Secondary | ICD-10-CM

## 2014-06-09 DIAGNOSIS — J9601 Acute respiratory failure with hypoxia: Secondary | ICD-10-CM

## 2014-06-09 DIAGNOSIS — Z09 Encounter for follow-up examination after completed treatment for conditions other than malignant neoplasm: Secondary | ICD-10-CM

## 2014-06-09 DIAGNOSIS — J869 Pyothorax without fistula: Secondary | ICD-10-CM

## 2014-06-09 DIAGNOSIS — J9600 Acute respiratory failure, unspecified whether with hypoxia or hypercapnia: Secondary | ICD-10-CM

## 2014-06-09 NOTE — Progress Notes (Signed)
[address]       [place] [address]             [phone]                  [name] [place] Medical Record #[id number] Date of Birth: [birth year]  Referring XB:[id number], Belva Crome, MD Primary Cardiology: Primary Care:MOORE, Elenore Rota, MD  Chief Complaint:  Follow Up Visit 05/03/2014  OPERATIVE REPORT  PREOPERATIVE DIAGNOSIS: Right lower lobe hospital-acquired pneumonia  with empyema.  POSTOPERATIVE DIAGNOSIS: Right lower lobe hospital-acquired pneumonia  with empyema.  SURGICAL PROCEDURE: Bronchoscopy, right video-assisted thoracoscopy,  minithoracotomy, decortication.    History of Present Illness:     Patient has slowly improved from when she was in the hospital, now ambulating with a cane. Her right foot cast has been removed. She was discharged from the hospital  dated skilled nursing facility after her episode of respiratory failure pneumonia empyema. She is now back at home. She notes she is call the cardiology office several times to straighten out her blood pressure medications but has not heard back from them.     Zubrod Score: At the time of surgery this patient's most appropriate activity status/level should be described as: []     0    Normal activity, no symptoms []     1    Restricted in physical strenuous activity but ambulatory, able to do out light work [x]     2    Ambulatory and capable of self care, unable to do work activities, up and about                 >50 % of waking hours                                                                                   []     3    Only limited self care, in bed greater than 50% of waking hours []     4    Completely disabled, no self care, confined to bed or chair []     5    Moribund  History  Smoking status  . Former Smoker -- 1.00 packs/day for 15 years  . Types: Cigarettes  . Quit date: 12/16/1989  Smokeless tobacco  . Never Used       Allergies  Allergen Reactions  . Aspirin  Other (See Comments)    REACTION: regular strength causes "heart to beat fast"  . Captopril Hypertension  . Naproxen Other (See Comments)    Tongue swelling  . Penicillins     Swelling around site  . Sulfonamide Derivatives Hives  . Clindamycin/Lincomycin     Current Outpatient Prescriptions  Medication Sig Dispense Refill  . acetaminophen (TYLENOL) 325 MG tablet Take 2 tablets (650 mg total) by mouth every 6 (six) hours as needed for mild pain or moderate pain.      Marland Kitchen ALPRAZolam (XANAX) 0.5 MG tablet TAKE 2 TABLETS AT BEDTIME AS NEEDED  60 tablet  0  . Ascorbic Acid (VITAMIN C PO) Take 1 tablet by mouth every morning.      . Calcium Carbonate-Vitamin D (CALCIUM  600+D) 600-400 MG-UNIT per tablet Take 1 tablet by mouth daily at 6 PM.       . cholecalciferol (VITAMIN D) 1000 UNITS tablet Take 1,000 Units by mouth 2 (two) times daily with breakfast and lunch.       . denosumab (PROLIA) 60 MG/ML SOLN injection Inject 60 mg into the skin every 6 (six) months. Administer in upper arm, thigh, or abdomen      . dextromethorphan-guaiFENesin (MUCINEX DM) 30-600 MG per 12 hr tablet Take 1 tablet by mouth every 12 (twelve) hours. scheduled      . fluticasone (FLONASE) 50 MCG/ACT nasal spray Place 2 sprays into both nostrils daily.      Marland Kitchen levalbuterol (XOPENEX) 0.63 MG/3ML nebulizer solution Take 3 mLs (0.63 mg total) by nebulization every 2 (two) hours as needed for wheezing or shortness of breath.  3 mL  12  . levothyroxine (SYNTHROID, LEVOTHROID) 25 MCG tablet Take 50 mcg by mouth daily before breakfast.      . loratadine (CLARITIN) 10 MG tablet Take 10 mg by mouth daily.       . metoprolol (LOPRESSOR) 50 MG tablet Take 50 mg by mouth 2 (two) times daily.      . montelukast (SINGULAIR) 10 MG tablet Take 10 mg by mouth at bedtime.      . Multiple Vitamin (MULTIVITAMIN WITH MINERALS) TABS Take 1 tablet by mouth daily.      . nitroGLYCERIN (NITROSTAT) 0.4 MG SL tablet Place 0.4 mg under the tongue  every 5 (five) minutes as needed. For chest pain      . omega-3 acid ethyl esters (LOVAZA) 1 G capsule Take 2 g by mouth 2 (two) times daily.      Marland Kitchen omeprazole-sodium bicarbonate (ZEGERID) 40-1100 MG per capsule Take 1 capsule by mouth daily before breakfast.      . ondansetron (ZOFRAN) 4 MG tablet Take 4 mg by mouth every 8 (eight) hours as needed for nausea or vomiting.      . predniSONE (DELTASONE) 10 MG tablet Take 10 mg by mouth daily with breakfast.      . predniSONE (DELTASONE) 5 MG tablet 1 tab daily for 5 days then 1/2 daily for 5 days and stop  10 tablet  0  . rivaroxaban (XARELTO) 20 MG TABS tablet Take 1 tablet (20 mg total) by mouth daily with supper.  30 tablet  0  . rosuvastatin (CRESTOR) 20 MG tablet Take 20 mg by mouth at bedtime.      . sertraline (ZOLOFT) 100 MG tablet Take 100 mg by mouth at bedtime.       No current facility-administered medications for this visit.       Physical Exam: BP 188/94  Pulse 74  Resp 18  Ht 4\' 11"  (1.499 m)  Wt 141 lb (63.957 kg)  BMI 28.46 kg/m2  SpO2 96%  General appearance: alert, cooperative, appears older than stated age and no distress Neurologic: intact Heart: regular rate and rhythm, S1, S2 normal, no murmur, click, rub or gallop Lungs: diminished breath sounds bibasilar Abdomen: soft, non-tender; bowel sounds normal; no masses,  no organomegaly Extremities: Cast on her right foot has been removed she has minimal edema currently walking with a cane Wound: Her thoracotomy incision is well-healed without evidence of infection   Diagnostic Studies & Laboratory data:         Recent Radiology Findings: Dg Chest 2 View  06/09/2014   CLINICAL DATA:  Respiratory failure.  EXAM: CHEST  2 VIEW  COMPARISON:  06/06/2014.  FINDINGS: Mediastinum and hilar structures are normal. Continued improvement of congestive heart failure and interstitial edema. Minimal residual interstitial changes are present. Small residual right pleural  effusion. No pneumothorax. Heart size stable. No acute bony abnormality.  IMPRESSION: 1. Continued improvement congestive heart failure and interstitial edema.  2. Improving infiltrate and pleural effusion on the right.   Electronically Signed   By: Marcello Moores  Register   On: 06/09/2014 15:09      Recent Labs: Lab Results  Component Value Date   WBC 16.4* 05/13/2014   HGB 11.1* 05/13/2014   HCT 30.2* 05/13/2014   PLT 289 05/13/2014   GLUCOSE 87 05/08/2014   CHOL 126 05/03/2014   TRIG 116 01/13/2014   HDL 102 01/13/2014   LDLCALC 53 01/13/2014   ALT 19 05/05/2014   AST 29 05/05/2014   NA 133* 05/08/2014   K 3.6* 05/08/2014   CL 98 05/08/2014   CREATININE 0.50 05/08/2014   BUN 8 05/08/2014   CO2 23 05/08/2014   TSH 0.521 04/30/2014   INR 1.12 05/02/2014      Assessment / Plan:   Slow but steady improvement after her admission with respiratory failure severe pneumonia and associated empyema. She is now ambulating, chest x-ray much improved She remains hypertensive after discharge from the nursing home and medical followup she is confused about her blood pressure medications, she is called : Heart care t I've not made a return appointment to see me she is closely followed by the pulmonary service.   Buffy Ehler B 06/09/2014 4:59 PM

## 2014-06-09 NOTE — Telephone Encounter (Signed)
Ask [facility]

## 2014-06-10 ENCOUNTER — Telehealth: Payer: Self-pay | Admitting: Adult Health

## 2014-06-10 ENCOUNTER — Telehealth: Payer: Self-pay | Admitting: Pulmonary Disease

## 2014-06-10 DIAGNOSIS — J449 Chronic obstructive pulmonary disease, unspecified: Secondary | ICD-10-CM

## 2014-06-10 MED ORDER — DILTIAZEM HCL ER COATED BEADS 120 MG PO CP24: 120.0000 mg | ORAL_CAPSULE | Freq: Every day | ORAL | Status: DC

## 2014-06-10 MED ORDER — BUDESONIDE 0.5 MG/2ML IN SUSP: 0.5000 mg | Freq: Two times a day (BID) | RESPIRATORY_TRACT | Status: DC

## 2014-06-10 NOTE — Telephone Encounter (Signed)
Called spoke with patient who reported that she is unable to afford the Pulmicort copay of $95 thru her local pharmacy.  Advised pt that we have one more option before changing her medications > send order to DME company to see if they can provide this any cheaper.  Pt is okay with this course of action and is aware that the process will not be completed until next week as TP is scheduled off until 7.1.15.  Dr Elsworth Soho, may we send order to DME for this nebulized medication?  Would you mind signing the rx since TP is not in the office?  Thank you!

## 2014-06-10 NOTE — Telephone Encounter (Signed)
Pt aware RX for budesonide has been sent. Nothing further needed

## 2014-06-10 NOTE — Telephone Encounter (Signed)
rx sent into pharmacy as requested Pt states understanding about starting it.

## 2014-06-10 NOTE — Telephone Encounter (Signed)
We can put her on Cardizem 120 mg daily.   Disp number 31 with 11 refills.

## 2014-06-11 NOTE — Telephone Encounter (Signed)
OK 

## 2014-06-13 ENCOUNTER — Inpatient Hospital Stay: Payer: Medicare HMO | Admitting: Pulmonary Disease

## 2014-06-13 MED ORDER — BUDESONIDE 0.5 MG/2ML IN SUSP: 0.5000 mg | Freq: Two times a day (BID) | RESPIRATORY_TRACT | Status: DC

## 2014-06-13 NOTE — Telephone Encounter (Signed)
ATC pt. Line continued to ring, unable to leave vm to inform pt that the order has been placed and someone should be contacting her regarding the medication. Order has been placed for DME to provide pulmicort. WCB.

## 2014-06-13 NOTE — Telephone Encounter (Signed)
Pulmicort script printed, signed by RA and given to Lewis.

## 2014-06-14 ENCOUNTER — Telehealth: Payer: Self-pay | Admitting: Pulmonary Disease

## 2014-06-14 NOTE — Telephone Encounter (Signed)
Called spoke with pt. She needed clarification on budesonide. I advised her she takes this 1 vial twice daily. Made her aware the albuterol is PRN. Nothing further needed and she verbalized understanding

## 2014-06-15 NOTE — Assessment & Plan Note (Signed)
Recent admission with severe PNA /empyema s/p VATS  Improving aeration on cxr   Plan  Follow up with TS as planned  Follow up Dr. Elsworth Soho  In 4 weeks and As needed

## 2014-06-15 NOTE — Assessment & Plan Note (Signed)
Recent flare with severe PNA/empyema > May taper steroids  Restart pulmicort  Plan  Take prednisone 5mg  daily for 5 days then 1/2 daily for 5 days and stop.  Use Albuterol Neb every 6hrs as needed  May resume Budesonide Neb Twice daily  , rinse after use.  Please contact office for sooner follow up if symptoms do not improve or worsen or seek emergency care  Follow up Dr. Elsworth Soho  In 4 weeks and As needed

## 2014-06-19 LAB — AFB CULTURE WITH SMEAR (NOT AT ARMC): Acid Fast Smear: NONE SEEN

## 2014-06-20 ENCOUNTER — Encounter: Payer: Self-pay | Admitting: Family Medicine

## 2014-06-20 ENCOUNTER — Ambulatory Visit (INDEPENDENT_AMBULATORY_CARE_PROVIDER_SITE_OTHER): Payer: PRIVATE HEALTH INSURANCE | Admitting: Family Medicine

## 2014-06-20 VITALS — BP 145/76 | HR 75 | Temp 97.0°F | Ht 59.0 in | Wt 146.2 lb

## 2014-06-20 DIAGNOSIS — E559 Vitamin D deficiency, unspecified: Secondary | ICD-10-CM

## 2014-06-20 DIAGNOSIS — R5383 Other fatigue: Secondary | ICD-10-CM

## 2014-06-20 DIAGNOSIS — M25579 Pain in unspecified ankle and joints of unspecified foot: Secondary | ICD-10-CM

## 2014-06-20 DIAGNOSIS — I10 Essential (primary) hypertension: Secondary | ICD-10-CM

## 2014-06-20 DIAGNOSIS — E785 Hyperlipidemia, unspecified: Secondary | ICD-10-CM

## 2014-06-20 DIAGNOSIS — R5381 Other malaise: Secondary | ICD-10-CM

## 2014-06-20 DIAGNOSIS — M25571 Pain in right ankle and joints of right foot: Secondary | ICD-10-CM

## 2014-06-20 LAB — POCT CBC
Granulocyte percent: 52.1 %G (ref 37–80)
HCT, POC: 39.1 % (ref 37.7–47.9)
Hemoglobin: 12.9 g/dL (ref 12.2–16.2)
Lymph, poc: 3.7 — AB (ref 0.6–3.4)
MCH, POC: 30.3 pg (ref 27–31.2)
MCHC: 33 g/dL (ref 31.8–35.4)
MCV: 91.9 fL (ref 80–97)
MPV: 6.8 fL (ref 0–99.8)
POC Granulocyte: 4.3 (ref 2–6.9)
POC LYMPH PERCENT: 44.2 %L (ref 10–50)
Platelet Count, POC: 379 10*3/uL (ref 142–424)
RBC: 4.3 M/uL (ref 4.04–5.48)
RDW, POC: 14.9 %
WBC: 8.3 10*3/uL (ref 4.6–10.2)

## 2014-06-20 NOTE — Patient Instructions (Addendum)
Continue current medications. Continue good therapeutic lifestyle changes which include good diet and exercise. Fall precautions discussed with patient. If an FOBT was given today- please return it to our front desk. If you are over 76 years old - you may need Prevnar 70 or the adult Pneumonia vaccine.  Continue to drink plenty of water and fluids Gradually increase your exercise as it will take several months for you to recover from all that you have been through recently. Continue followup visits with the orthopedist, the pulmonologist, and the cardiologist. We will schedule you for your mammogram and DEXA scans.

## 2014-06-20 NOTE — Progress Notes (Signed)
Subjective:    Patient ID: [name], female    DOB: [birth year], 78 y.o.   MRN: [id number]  HPI Pt here for follow up and management a multiplicity of chronic medical problems which include hypertension, hyperlipidemia, hypothyroidism, CHF, atrial fibrillation, osteoporosis and recent hospitalization for aspiration pneumonia.She has recently been discharged from the nursing home. Several months ago she had reconstruction of her ankle.it was at that time that everything followed with the pneumonia and rehabilitation at the nursing home. She has been home from the nursing home now for about 2-1/2 weeks. She has followup visit scheduled with the orthopedist and the pulmonologist. She also sees a cardiologist. She will be scheduled for a mammogram and declines to have a pelvic exam done. She will also be scheduled for a bone density test.     Patient Active Problem List   Diagnosis Date Noted  . Hemothorax on right 05/01/2014  . CHF (congestive heart failure) 04/30/2014  . Acute respiratory failure 04/30/2014  . Acute respiratory failure with hypoxia 04/30/2014  . Empyema, right 04/30/2014  . 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 Osteoporosis 01/27/2014  . Fibromyalgia 08/09/2013  . REACTIVE AIRWAY DISEASE 08/01/2010  . HYPOTHYROIDISM 03/20/2010  . DEPRESSION 03/20/2010  . HYPERTENSION 03/20/2010  . ESOPHAGEAL MOTILITY DISORDER 03/20/2010  . CHEST PAIN 05/03/2009  . HYPERLIPIDEMIA 04/22/2009  . Cor athrscl-uns vessel 04/22/2009  . ATRIAL FIBRILLATION, PAROXYSMAL 04/22/2009  . ESOPHAGEAL STRICTURE 10/31/2004  . GERD 10/31/2004  . HIATAL HERNIA 10/06/2001   Outpatient Encounter Prescriptions as of 06/20/2014  Medication Sig  . acetaminophen (TYLENOL) 325 MG tablet Take 2 tablets (650 mg total) by mouth every 6 (six) hours as needed for mild pain or moderate pain.  Marland Kitchen ALPRAZolam (XANAX) 0.5 MG tablet TAKE 2 TABLETS AT BEDTIME AS NEEDED  . Ascorbic Acid (VITAMIN C PO) Take 1  tablet by mouth every morning.  . budesonide (PULMICORT) 0.5 MG/2ML nebulizer solution Take 2 mLs (0.5 mg total) by nebulization 2 (two) times daily. Dx 496  . Calcium Carbonate-Vitamin D (CALCIUM 600+D) 600-400 MG-UNIT per tablet Take 1 tablet by mouth daily at 6 PM.   . cholecalciferol (VITAMIN D) 1000 UNITS tablet Take 1,000 Units by mouth 2 (two) times daily with breakfast and lunch.   . denosumab (PROLIA) 60 MG/ML SOLN injection Inject 60 mg into the skin every 6 (six) months. Administer in upper arm, thigh, or abdomen  . dextromethorphan-guaiFENesin (MUCINEX DM) 30-600 MG per 12 hr tablet Take 1 tablet by mouth every 12 (twelve) hours. scheduled  . diltiazem (CARDIZEM CD) 120 MG 24 hr capsule Take 1 capsule (120 mg total) by mouth daily.  . fluticasone (FLONASE) 50 MCG/ACT nasal spray Place 2 sprays into both nostrils daily.  Marland Kitchen levalbuterol (XOPENEX) 0.63 MG/3ML nebulizer solution Take 3 mLs (0.63 mg total) by nebulization every 2 (two) hours as needed for wheezing or shortness of breath.  . levothyroxine (SYNTHROID, LEVOTHROID) 25 MCG tablet Take 50 mcg by mouth daily before breakfast.  . loratadine (CLARITIN) 10 MG tablet Take 10 mg by mouth daily.   . metoprolol (LOPRESSOR) 50 MG tablet Take 50 mg by mouth 2 (two) times daily.  . montelukast (SINGULAIR) 10 MG tablet Take 10 mg by mouth at bedtime.  . Multiple Vitamin (MULTIVITAMIN WITH MINERALS) TABS Take 1 tablet by mouth daily.  . nitroGLYCERIN (NITROSTAT) 0.4 MG SL tablet Place 0.4 mg under the tongue every 5 (five) minutes as needed. For chest pain  . omega-3 acid ethyl  esters (LOVAZA) 1 G capsule Take 2 g by mouth 2 (two) times daily.  Marland Kitchen omeprazole-sodium bicarbonate (ZEGERID) 40-1100 MG per capsule Take 1 capsule by mouth daily before breakfast.  . ondansetron (ZOFRAN) 4 MG tablet Take 4 mg by mouth every 8 (eight) hours as needed for nausea or vomiting.  . rivaroxaban (XARELTO) 20 MG TABS tablet Take 1 tablet (20 mg total) by mouth  daily with supper.  . rosuvastatin (CRESTOR) 20 MG tablet Take 20 mg by mouth at bedtime.  . sertraline (ZOLOFT) 100 MG tablet Take 100 mg by mouth at bedtime.  . [DISCONTINUED] predniSONE (DELTASONE) 10 MG tablet Take 10 mg by mouth daily with breakfast.  . [DISCONTINUED] predniSONE (DELTASONE) 5 MG tablet 1 tab daily for 5 days then 1/2 daily for 5 days and stop            Review of Systems  Constitutional: Positive for fatigue.  Eyes: Negative.   Respiratory: Positive for cough (dry, mainly late in the day).   Cardiovascular: Negative for chest pain and palpitations.  Endocrine: Negative.   Genitourinary: Negative.   Musculoskeletal: Positive for arthralgias (stable).  Allergic/Immunologic: Negative.   Neurological: Negative for dizziness and headaches.       Objective:   Physical Exam  Nursing note and vitals reviewed. Constitutional: She is oriented to person, place, and time. She appears well-developed and well-nourished. No distress.  The patient is pleasant and cooperative and has a positive attitude considering all that she has been through recently. She is using a cane. She was able to get on the exam table with assistance.  HENT:  Head: Normocephalic and atraumatic.  Right Ear: External ear normal.  Left Ear: External ear normal.  Nose: Nose normal.  Mouth/Throat: Oropharynx is clear and moist.  Eyes: Conjunctivae and EOM are normal. Pupils are equal, round, and reactive to light. Right eye exhibits no discharge. Left eye exhibits no discharge. No scleral icterus.  Neck: Normal range of motion. Neck supple. No thyromegaly present.  There were no carotid bruits.  Cardiovascular: Normal rate, regular rhythm and normal heart sounds.  Exam reveals no gallop and no friction rub.   No murmur heard. Femoral pulses were intact bilaterally.The heart has a regular rate and rhythm at 72 per minute.  Pulmonary/Chest: Effort normal and breath sounds normal. No respiratory  distress. She has no wheezes. She has no rales. She exhibits no tenderness.  Abdominal: Soft. Bowel sounds are normal. She exhibits no mass. There is no tenderness. There is no rebound and no guarding.  Musculoskeletal: Normal range of motion. She exhibits no edema and no tenderness.  The patient had limited range of motion of that most recently operated ankle. She is wearing a brace. There was no pedal edema on either leg.  Lymphadenopathy:    She has no cervical adenopathy.  Neurological: She is alert and oriented to person, place, and time. She has normal reflexes. No cranial nerve deficit.  Skin: Skin is warm and dry. No rash noted. No erythema.  Psychiatric: She has a normal mood and affect. Her behavior is normal. Judgment and thought content normal.   BP 145/76  Pulse 75  Temp(Src) 97 F (36.1 C) (Oral)  Ht '4\' 11"'  (1.499 m)  Wt 146 lb 3.2 oz (66.316 kg)  BMI 29.51 kg/m2        Assessment & Plan:  1. Hyperlipidemia - NMR, lipoprofile - BMP8+EGFR - Hepatic function panel  2. Essential hypertension - POCT CBC - BMP8+EGFR  3. Vitamin D deficiency - Vit D  25 hydroxy (rtn osteoporosis monitoring)  4. Other malaise and fatigue -continue physical therapy and gradually increase exercise  5. Pain, joint, ankle and foot, right -continue treatment as per orthopedic  No orders of the defined types were placed in this encounter.   Patient Instructions  Continue current medications. Continue good therapeutic lifestyle changes which include good diet and exercise. Fall precautions discussed with patient. If an FOBT was given today- please return it to our front desk. If you are over 79 years old - you may need Prevnar 82 or the adult Pneumonia vaccine.  Continue to drink plenty of water and fluids Gradually increase your exercise as it will take several months for you to recover from all that you have been through recently. Continue followup visits with the orthopedist,  the pulmonologist, and the cardiologist. We will schedule you for your mammogram and DEXA scans.   Arrie Senate MD

## 2014-06-21 LAB — BMP8+EGFR
BUN/Creatinine Ratio: 11 (ref 11–26)
BUN: 9 mg/dL (ref 8–27)
CO2: 24 mmol/L (ref 18–29)
Calcium: 9.7 mg/dL (ref 8.7–10.3)
Chloride: 98 mmol/L (ref 97–108)
Creatinine, Ser: 0.82 mg/dL (ref 0.57–1.00)
GFR calc Af Amer: 80 mL/min/{1.73_m2} (ref >59)
GFR calc non Af Amer: 70 mL/min/{1.73_m2} (ref >59)
Glucose: 83 mg/dL (ref 65–99)
Potassium: 4.5 mmol/L (ref 3.5–5.2)
Sodium: 138 mmol/L (ref 134–144)

## 2014-06-21 LAB — NMR, LIPOPROFILE
Cholesterol: 138 mg/dL (ref 100–199)
HDL Cholesterol by NMR: 59 mg/dL (ref >39)
HDL Particle Number: 34.6 umol/L (ref >=30.5)
LDL Particle Number: 735 nmol/L (ref <1000)
LDL Size: 20.5 nm (ref >20.5)
LDLC SERPL CALC-MCNC: 61 mg/dL (ref 0–99)
LP-IR Score: 32 (ref <=45)
Small LDL Particle Number: 492 nmol/L (ref <=527)
Triglycerides by NMR: 90 mg/dL (ref 0–149)

## 2014-06-21 LAB — HEPATIC FUNCTION PANEL
ALT: 28 IU/L (ref 0–32)
AST: 36 IU/L (ref 0–40)
Albumin: 4.3 g/dL (ref 3.5–4.8)
Alkaline Phosphatase: 88 IU/L (ref 39–117)
Bilirubin, Direct: 0.11 mg/dL (ref 0.00–0.40)
Total Bilirubin: 0.3 mg/dL (ref 0.0–1.2)
Total Protein: 7.7 g/dL (ref 6.0–8.5)

## 2014-06-21 LAB — VITAMIN D 25 HYDROXY (VIT D DEFICIENCY, FRACTURES): Vit D, 25-Hydroxy: 46.9 ng/mL (ref 30.0–100.0)

## 2014-06-27 ENCOUNTER — Other Ambulatory Visit: Payer: Self-pay | Admitting: Family Medicine

## 2014-06-27 ENCOUNTER — Telehealth: Payer: Self-pay

## 2014-06-27 NOTE — Telephone Encounter (Signed)
Faxed prior authorization for Zegerid through cover my meds.

## 2014-06-27 NOTE — Telephone Encounter (Signed)
Patient last seen in office on 06-20-14. Rx last filled on 05-24-14 for #[id number]. Please advise. If approved please route to Pool A so nurse can phone in to pharmacy

## 2014-06-27 NOTE — Telephone Encounter (Signed)
This is okay to refill 

## 2014-06-28 ENCOUNTER — Ambulatory Visit: Payer: PRIVATE HEALTH INSURANCE

## 2014-06-28 ENCOUNTER — Other Ambulatory Visit: Payer: PRIVATE HEALTH INSURANCE

## 2014-06-28 ENCOUNTER — Other Ambulatory Visit: Payer: Self-pay | Admitting: Pharmacist

## 2014-06-28 ENCOUNTER — Telehealth: Payer: Self-pay | Admitting: Pharmacist

## 2014-06-28 MED ORDER — DENOSUMAB 60 MG/ML ~~LOC~~ SOLN: 60.0000 mg | SUBCUTANEOUS | Status: DC

## 2014-06-28 NOTE — Telephone Encounter (Signed)
Patient's prolia due 07/27/14.  DExa rescheduled 07/27/14. Prolia called to [facility]

## 2014-06-28 NOTE — Telephone Encounter (Signed)
Called into pharmacy

## 2014-06-28 NOTE — Telephone Encounter (Signed)
prolia due 07/27/2014- odered from [place] needed.  [facility] is forwarding information for PA.

## 2014-06-29 NOTE — Telephone Encounter (Signed)
Received approval from [place] for Zegerid.

## 2014-06-30 NOTE — Telephone Encounter (Signed)
Close Encounter 

## 2014-07-05 ENCOUNTER — Other Ambulatory Visit: Payer: Self-pay | Admitting: Internal Medicine

## 2014-07-05 ENCOUNTER — Other Ambulatory Visit: Payer: Self-pay | Admitting: Family Medicine

## 2014-07-06 ENCOUNTER — Ambulatory Visit: Payer: Self-pay

## 2014-07-06 ENCOUNTER — Other Ambulatory Visit: Payer: PRIVATE HEALTH INSURANCE

## 2014-07-08 ENCOUNTER — Other Ambulatory Visit: Payer: Self-pay | Admitting: *Deleted

## 2014-07-08 ENCOUNTER — Telehealth: Payer: Self-pay | Admitting: Cardiology

## 2014-07-08 MED ORDER — RIVAROXABAN 20 MG PO TABS: 20.0000 mg | ORAL_TABLET | Freq: Every day | ORAL | Status: DC

## 2014-07-08 NOTE — Telephone Encounter (Signed)
Pt says she can not get her Xarelto. Pharmacist is waiting for a prior authorization,need this asap please. Please call to [facility] Please let her know when this approved.

## 2014-07-08 NOTE — Telephone Encounter (Signed)
Spoke to patient. RN asked if patient has enough medication until Monday.Patient states she has enough medication. RN informed patient to come to office to pick samples. She states she can pickup samples on Tuesday.  RN  [facility] - prior authorization # 1 [id number]--   ID# [id number]   RN spoke to insurance rep Mallie Mussel- to start PA.  PER Henry , PA WAS STARTED WAS DENIED due to lack information . -NEXT STEP IS APPEAL Form was faxed to fill out.    RN called patient in formed patient it will take few days to get this process completed. Will call patient back with,if medication was approve or not.

## 2014-07-08 NOTE — Telephone Encounter (Signed)
Follow up     Pt was denied xarelto.  This is the PCP's office calling to see if we have did an appeal for this medication and if we are aware of this.  Patient needs her medication----per PCP

## 2014-07-12 ENCOUNTER — Ambulatory Visit (INDEPENDENT_AMBULATORY_CARE_PROVIDER_SITE_OTHER): Payer: Medicare Other | Admitting: Pulmonary Disease

## 2014-07-12 ENCOUNTER — Encounter: Payer: Self-pay | Admitting: Pulmonary Disease

## 2014-07-12 VITALS — BP 132/78 | HR 71 | Temp 98.1°F | Ht 59.0 in | Wt 147.8 lb

## 2014-07-12 DIAGNOSIS — J45909 Unspecified asthma, uncomplicated: Secondary | ICD-10-CM

## 2014-07-12 DIAGNOSIS — J869 Pyothorax without fistula: Secondary | ICD-10-CM

## 2014-07-12 NOTE — Patient Instructions (Signed)
Stay on budesonide Use albuterol as needed Will discontinue oxygen

## 2014-07-12 NOTE — Progress Notes (Signed)
   Subjective:    Patient ID: [name], female    DOB: Jul 08, 1936, 78 y.o.   MRN: [id number]  HPI  78/F remote ex smoker with reactive airway disease - ? GERD vs sinusitis, nml pFTs  She smoked a PPD x 20-25 yrs before quitting in 1990. She develops recurrent chest colds requiring several rounds of Abx to get better.  Data:  SHe underwent esophageal dilation for hypertensive LES (Dr Olevia Perches) & is on protonix two times a day . Evaluation for chest pain in 6/10 (Dr Percival Spanish) -no obstructive CAD - attributed to esophageal spasm.Sucralfate added by GI to zegerid .  Took macrodantin x 2 yrs for UTIs , stopped '10  Prednisone makes her hyper and nervous. Has osteoporosis with fractures.  PFTs nml '11 .  RAST - IgE 225 -high but no sensitivity to common indoor/ outdoor allergens  Underwent endoscopic sinus surgery 1/12 by dr Constance Holster  Treated x 6 wks with clinda for chronic sinusitis, started wheezing again when ABx stopped   06/24/2013 Spirometry -no obstruction    07/12/2014  Chief Complaint  Patient presents with  . Follow-up    reports breathing is better, dry cough, some PND/nasal congestion (allergy related). denies any wheezing, no chest tx. Reports using the albuterol neb QD.     She had a prolonged hospitalization with rehabilitation stay. Patient was admitted May 15 for acute hypoxic respiratory failure, secondary to pneumonia, asthma, exacerbation, congestive heart failure, and MRSA empyema requiring vats on May 19, complicated by an element of vocal cord dysfunction, and chronic cough.  Patient had a slow to resolve right lower lobe pneumonia with a loculated pleural effusion and worsening respiratory failure despite aggressive antibiotics.  Cultures returned back for positive MRSA. Patient did have upper airway wheezing, consistent with vocal cord dysfunction. She was seen by ENT, who did a laryngoscopy at the bedside. That was consistent with diffuse inflammation of the vocal cords,  and supraglottic larynx. Her hospital stay. Was, complicated by atrial fib with RVR. Reports breathing is doing well ,she does mention some fatigue/weakness. Cough is much better.  Is now home from rehab.  Has  restarted her Pulmiocrt neb .  Prednisone was tapered to off CXR showed improved aeration w/ improving infiltrate /effusion on right   She's not using her oxygen and would like this to be discontinued. She does desat to 87% on walking one lap around the office, but recovered spontaneously on resting     Review of Systems neg for any significant sore throat, dysphagia, itching, sneezing, nasal congestion or excess/ purulent secretions, fever, chills, sweats, unintended wt loss, pleuritic or exertional cp, hempoptysis, orthopnea pnd or change in chronic leg swelling. Also denies presyncope, palpitations, heartburn, abdominal pain, nausea, vomiting, diarrhea or change in bowel or urinary habits, dysuria,hematuria, rash, arthralgias, visual complaints, headache, numbness weakness or ataxia.     Objective:   Physical Exam  Gen. Pleasant, well-nourished, in no distress ENT - no lesions, no post nasal drip Neck: No JVD, no thyromegaly, no carotid bruits Lungs: no use of accessory muscles, no dullness to percussion, clear without rales or rhonchi  Cardiovascular: Rhythm regular, heart sounds  normal, no murmurs or gallops, no peripheral edema Musculoskeletal: No deformities, no cyanosis or clubbing        Assessment & Plan:

## 2014-07-13 ENCOUNTER — Telehealth: Payer: Self-pay | Admitting: Pulmonary Disease

## 2014-07-13 NOTE — Assessment & Plan Note (Signed)
resolved 

## 2014-07-13 NOTE — Assessment & Plan Note (Signed)
Stay on budesonide Use albuterol as needed Will discontinue oxyge

## 2014-07-13 NOTE — Telephone Encounter (Signed)
Order was faxed this AM Pt aware and nothing further needed

## 2014-07-20 ENCOUNTER — Encounter: Payer: Self-pay | Admitting: *Deleted

## 2014-07-20 NOTE — Telephone Encounter (Signed)
This encounter was created in error - please disregard.

## 2014-07-27 ENCOUNTER — Ambulatory Visit (INDEPENDENT_AMBULATORY_CARE_PROVIDER_SITE_OTHER): Payer: PRIVATE HEALTH INSURANCE | Admitting: Pharmacist

## 2014-07-27 ENCOUNTER — Ambulatory Visit (INDEPENDENT_AMBULATORY_CARE_PROVIDER_SITE_OTHER): Payer: PRIVATE HEALTH INSURANCE

## 2014-07-27 ENCOUNTER — Encounter: Payer: Self-pay | Admitting: Pharmacist

## 2014-07-27 VITALS — BP 122/70 | HR 70 | Ht 59.0 in | Wt 148.0 lb

## 2014-07-27 DIAGNOSIS — Z0000 Encounter for general adult medical examination without abnormal findings: Secondary | ICD-10-CM

## 2014-07-27 DIAGNOSIS — M810 Age-related osteoporosis without current pathological fracture: Secondary | ICD-10-CM

## 2014-07-27 DIAGNOSIS — H9190 Unspecified hearing loss, unspecified ear: Secondary | ICD-10-CM

## 2014-07-27 LAB — HM DEXA SCAN

## 2014-07-27 MED ORDER — DENOSUMAB 60 MG/ML ~~LOC~~ SOLN
60.0000 mg | Freq: Once | SUBCUTANEOUS | Status: AC
  Administered 2014-07-27: 60 mg via SUBCUTANEOUS

## 2014-07-27 NOTE — Patient Instructions (Addendum)
Health Maintenance Summary    INFLUENZA VACCINE Next Due 07/16/2014      COLONOSCOPY Next Due 08/29/2014     DEXA / Bone Density Next Due 07/27/2016 Done last 07/27/2014   Pneumonia Completed     Shingles / Zostavax Completed     Mammogram Needed / Due Now  Appt made today     TETANUS/TDAP Next Due 08/16/2021         Preventive Care for Adults A healthy lifestyle and preventive care can promote health and wellness. Preventive health guidelines for women include the following key practices.  A routine yearly physical is a good way to check with your health care provider about your health and preventive screening. It is a chance to share any concerns and updates on your health and to receive a thorough exam.  Visit your dentist for a routine exam and preventive care every 6 months. Brush your teeth twice a day and floss once a day. Good oral hygiene prevents tooth decay and gum disease.  The frequency of eye exams is based on your age, health, family medical history, use of contact lenses, and other factors. Follow your health care provider's recommendations for frequency of eye exams.  Eat a healthy diet. Foods like vegetables, fruits, whole grains, low-fat dairy products, and lean protein foods contain the nutrients you need without too many calories. Decrease your intake of foods high in solid fats, added sugars, and salt. Eat the right amount of calories for you.Get information about a proper diet from your health care provider, if necessary.  Regular physical exercise is one of the most important things you can do for your health. Most adults should get at least 150 minutes of moderate-intensity exercise (any activity that increases your heart rate and causes you to sweat) each week. In addition, most adults need muscle-strengthening exercises on 2 or more days a week.  Maintain a healthy weight. The body mass index (BMI) is a screening tool to identify possible weight problems. It provides an  estimate of body fat based on height and weight. Your health care provider can find your BMI and can help you achieve or maintain a healthy weight.For adults 20 years and older:  A BMI below 18.5 is considered underweight.  A BMI of 18.5 to 24.9 is normal.  A BMI of 25 to 29.9 is considered overweight.  A BMI of 30 and above is considered obese.  Maintain normal blood lipids and cholesterol levels by exercising and minimizing your intake of saturated fat. Eat a balanced diet with plenty of fruit and vegetables. Blood tests for lipids and cholesterol should begin at age 75 and be repeated every 5 years. If your lipid or cholesterol levels are high, you are over 50, or you are at high risk for heart disease, you may need your cholesterol levels checked more frequently.Ongoing high lipid and cholesterol levels should be treated with medicines if diet and exercise are not working.  If you smoke, find out from your health care provider how to quit. If you do not use tobacco, do not start.  Lung cancer screening is recommended for adults aged 61-80 years who are at high risk for developing lung cancer because of a history of smoking. A yearly low-dose CT scan of the lungs is recommended for people who have at least a 30-pack-year history of smoking and are a current smoker or have quit within the past 15 years. A pack year of smoking is smoking an average of  1 pack of cigarettes a day for 1 year (for example: 1 pack a day for 30 years or 2 packs a day for 15 years). Yearly screening should continue until the smoker has stopped smoking for at least 15 years. Yearly screening should be stopped for people who develop a health problem that would prevent them from having lung cancer treatment.  If you are pregnant, do not drink alcohol. If you are breastfeeding, be very cautious about drinking alcohol. If you are not pregnant and choose to drink alcohol, do not have more than 1 drink per day. One drink is  considered to be 12 ounces (355 mL) of beer, 5 ounces (148 mL) of wine, or 1.5 ounces (44 mL) of liquor.  Avoid use of street drugs. Do not share needles with anyone. Ask for help if you need support or instructions about stopping the use of drugs.  High blood pressure causes heart disease and increases the risk of stroke. Your blood pressure should be checked at least every 1 to 2 years. Ongoing high blood pressure should be treated with medicines if weight loss and exercise do not work.  If you are 37-11 years old, ask your health care provider if you should take aspirin to prevent strokes.  Diabetes screening involves taking a blood sample to check your fasting blood sugar level. This should be done once every 3 years, after age 52, if you are within normal weight and without risk factors for diabetes. Testing should be considered at a younger age or be carried out more frequently if you are overweight and have at least 1 risk factor for diabetes.  Breast cancer screening is essential preventive care for women. You should practice "breast self-awareness." This means understanding the normal appearance and feel of your breasts and may include breast self-examination. Any changes detected, no matter how small, should be reported to a health care provider. Women in their 47s and 30s should have a clinical breast exam (CBE) by a health care provider as part of a regular health exam every 1 to 3 years. After age 68, women should have a CBE every year. Starting at age 36, women should consider having a mammogram (breast X-ray test) every year. Women who have a family history of breast cancer should talk to their health care provider about genetic screening. Women at a high risk of breast cancer should talk to their health care providers about having an MRI and a mammogram every year.  Breast cancer gene (BRCA)-related cancer risk assessment is recommended for women who have family members with BRCA-related  cancers. BRCA-related cancers include breast, ovarian, tubal, and peritoneal cancers. Having family members with these cancers may be associated with an increased risk for harmful changes (mutations) in the breast cancer genes BRCA1 and BRCA2. Results of the assessment will determine the need for genetic counseling and BRCA1 and BRCA2 testing.  Routine pelvic exams to screen for cancer are no longer recommended for nonpregnant women who are considered low risk for cancer of the pelvic organs (ovaries, uterus, and vagina) and who do not have symptoms. Ask your health care provider if a screening pelvic exam is right for you.  If you have had past treatment for cervical cancer or a condition that could lead to cancer, you need Pap tests and screening for cancer for at least 20 years after your treatment. If Pap tests have been discontinued, your risk factors (such as having a new sexual partner) need to be reassessed to  determine if screening should be resumed. Some women have medical problems that increase the chance of getting cervical cancer. In these cases, your health care provider may recommend more frequent screening and Pap tests.  The HPV test is an additional test that may be used for cervical cancer screening. The HPV test looks for the virus that can cause the cell changes on the cervix. The cells collected during the Pap test can be tested for HPV. The HPV test could be used to screen women aged 64 years and older, and should be used in women of any age who have unclear Pap test results. After the age of 63, women should have HPV testing at the same frequency as a Pap test.  Colorectal cancer can be detected and often prevented. Most routine colorectal cancer screening begins at the age of 61 years and continues through age 72 years. However, your health care provider may recommend screening at an earlier age if you have risk factors for colon cancer. On a yearly basis, your health care provider  may provide home test kits to check for hidden blood in the stool. Use of a small camera at the end of a tube, to directly examine the colon (sigmoidoscopy or colonoscopy), can detect the earliest forms of colorectal cancer. Talk to your health care provider about this at age 53, when routine screening begins. Direct exam of the colon should be repeated every 5-10 years through age 25 years, unless early forms of pre-cancerous polyps or small growths are found.  People who are at an increased risk for hepatitis B should be screened for this virus. You are considered at high risk for hepatitis B if:  You were born in a country where hepatitis B occurs often. Talk with your health care provider about which countries are considered high risk.  Your parents were born in a high-risk country and you have not received a shot to protect against hepatitis B (hepatitis B vaccine).  You have HIV or AIDS.  You use needles to inject street drugs.  You live with, or have sex with, someone who has hepatitis B.  You get hemodialysis treatment.  You take certain medicines for conditions like cancer, organ transplantation, and autoimmune conditions.  Hepatitis C blood testing is recommended for all people born from [birth year] through 1963 and any individual with known risks for hepatitis C.  Practice safe sex. Use condoms and avoid high-risk sexual practices to reduce the spread of sexually transmitted infections (STIs). STIs include gonorrhea, chlamydia, syphilis, trichomonas, herpes, HPV, and human immunodeficiency virus (HIV). Herpes, HIV, and HPV are viral illnesses that have no cure. They can result in disability, cancer, and death.  You should be screened for sexually transmitted illnesses (STIs) including gonorrhea and chlamydia if:  You are sexually active and are younger than 24 years.  You are older than 24 years and your health care provider tells you that you are at risk for this type of  infection.  Your sexual activity has changed since you were last screened and you are at an increased risk for chlamydia or gonorrhea. Ask your health care provider if you are at risk.  If you are at risk of being infected with HIV, it is recommended that you take a prescription medicine daily to prevent HIV infection. This is called preexposure prophylaxis (PrEP). You are considered at risk if:  You are a heterosexual woman, are sexually active, and are at increased risk for HIV infection.  You take  drugs by injection.  You are sexually active with a partner who has HIV.  Talk with your health care provider about whether you are at high risk of being infected with HIV. If you choose to begin PrEP, you should first be tested for HIV. You should then be tested every 3 months for as long as you are taking PrEP.  Osteoporosis is a disease in which the bones lose minerals and strength with aging. This can result in serious bone fractures or breaks. The risk of osteoporosis can be identified using a bone density scan. Women ages 52 years and over and women at risk for fractures or osteoporosis should discuss screening with their health care providers. Ask your health care provider whether you should take a calcium supplement or vitamin D to reduce the rate of osteoporosis.  Menopause can be associated with physical symptoms and risks. Hormone replacement therapy is available to decrease symptoms and risks. You should talk to your health care provider about whether hormone replacement therapy is right for you.  Use sunscreen. Apply sunscreen liberally and repeatedly throughout the day. You should seek shade when your shadow is shorter than you. Protect yourself by wearing long sleeves, pants, a wide-brimmed hat, and sunglasses year round, whenever you are outdoors.  Once a month, do a whole body skin exam, using a mirror to look at the skin on your back. Tell your health care provider of new moles,  moles that have irregular borders, moles that are larger than a pencil eraser, or moles that have changed in shape or color.  Stay current with required vaccines (immunizations).  Influenza vaccine. All adults should be immunized every year.  Tetanus, diphtheria, and acellular pertussis (Td, Tdap) vaccine. Pregnant women should receive 1 dose of Tdap vaccine during each pregnancy. The dose should be obtained regardless of the length of time since the last dose. Immunization is preferred during the 27th-36th week of gestation. An adult who has not previously received Tdap or who does not know her vaccine status should receive 1 dose of Tdap. This initial dose should be followed by tetanus and diphtheria toxoids (Td) booster doses every 10 years. Adults with an unknown or incomplete history of completing a 3-dose immunization series with Td-containing vaccines should begin or complete a primary immunization series including a Tdap dose. Adults should receive a Td booster every 10 years.  Varicella vaccine. An adult without evidence of immunity to varicella should receive 2 doses or a second dose if she has previously received 1 dose. Pregnant females who do not have evidence of immunity should receive the first dose after pregnancy. This first dose should be obtained before leaving the health care facility. The second dose should be obtained 4-8 weeks after the first dose.  Human papillomavirus (HPV) vaccine. Females aged 13-26 years who have not received the vaccine previously should obtain the 3-dose series. The vaccine is not recommended for use in pregnant females. However, pregnancy testing is not needed before receiving a dose. If a female is found to be pregnant after receiving a dose, no treatment is needed. In that case, the remaining doses should be delayed until after the pregnancy. Immunization is recommended for any person with an immunocompromised condition through the age of 38 years if she  did not get any or all doses earlier. During the 3-dose series, the second dose should be obtained 4-8 weeks after the first dose. The third dose should be obtained 24 weeks after the first dose and  16 weeks after the second dose.  Zoster vaccine. One dose is recommended for adults aged 106 years or older unless certain conditions are present.  Measles, mumps, and rubella (MMR) vaccine. Adults born before [birth year] generally are considered immune to measles and mumps. Adults born in [birth year] or later should have 1 or more doses of MMR vaccine unless there is a contraindication to the vaccine or there is laboratory evidence of immunity to each of the three diseases. A routine second dose of MMR vaccine should be obtained at least 28 days after the first dose for students attending postsecondary schools, health care workers, or international travelers. People who received inactivated measles vaccine or an unknown type of measles vaccine during 1963-1967 should receive 2 doses of MMR vaccine. People who received inactivated mumps vaccine or an unknown type of mumps vaccine before 1979 and are at high risk for mumps infection should consider immunization with 2 doses of MMR vaccine. For females of childbearing age, rubella immunity should be determined. If there is no evidence of immunity, females who are not pregnant should be vaccinated. If there is no evidence of immunity, females who are pregnant should delay immunization until after pregnancy. Unvaccinated health care workers born before [birth year] who lack laboratory evidence of measles, mumps, or rubella immunity or laboratory confirmation of disease should consider measles and mumps immunization with 2 doses of MMR vaccine or rubella immunization with 1 dose of MMR vaccine.  Pneumococcal 13-valent conjugate (PCV13) vaccine. When indicated, a person who is uncertain of her immunization history and has no record of immunization should receive the PCV13 vaccine. An adult  aged 52 years or older who has certain medical conditions and has not been previously immunized should receive 1 dose of PCV13 vaccine. This PCV13 should be followed with a dose of pneumococcal polysaccharide (PPSV23) vaccine. The PPSV23 vaccine dose should be obtained at least 8 weeks after the dose of PCV13 vaccine. An adult aged 63 years or older who has certain medical conditions and previously received 1 or more doses of PPSV23 vaccine should receive 1 dose of PCV13. The PCV13 vaccine dose should be obtained 1 or more years after the last PPSV23 vaccine dose.  Pneumococcal polysaccharide (PPSV23) vaccine. When PCV13 is also indicated, PCV13 should be obtained first. All adults aged 39 years and older should be immunized. An adult younger than age 39 years who has certain medical conditions should be immunized. Any person who resides in a nursing home or long-term care facility should be immunized. An adult smoker should be immunized. People with an immunocompromised condition and certain other conditions should receive both PCV13 and PPSV23 vaccines. People with human immunodeficiency virus (HIV) infection should be immunized as soon as possible after diagnosis. Immunization during chemotherapy or radiation therapy should be avoided. Routine use of PPSV23 vaccine is not recommended for American Indians, [place] Natives, or people younger than 65 years unless there are medical conditions that require PPSV23 vaccine. When indicated, people who have unknown immunization and have no record of immunization should receive PPSV23 vaccine. One-time revaccination 5 years after the first dose of PPSV23 is recommended for people aged 19-64 years who have chronic kidney failure, nephrotic syndrome, asplenia, or immunocompromised conditions. People who received 1-2 doses of PPSV23 before age 17 years should receive another dose of PPSV23 vaccine at age 55 years or later if at least 5 years have passed since the previous  dose. Doses of PPSV23 are not needed for people immunized with PPSV23 at  or after age 1 years.  Meningococcal vaccine. Adults with asplenia or persistent complement component deficiencies should receive 2 doses of quadrivalent meningococcal conjugate (MenACWY-D) vaccine. The doses should be obtained at least 2 months apart. Microbiologists working with certain meningococcal bacteria, [employer] recruits, people at risk during an outbreak, and people who travel to or live in countries with a high rate of meningitis should be immunized. A first-year college student up through age 27 years who is living in a residence hall should receive a dose if she did not receive a dose on or after her 16th birthday. Adults who have certain high-risk conditions should receive one or more doses of vaccine.  Hepatitis A vaccine. Adults who wish to be protected from this disease, have certain high-risk conditions, work with hepatitis A-infected animals, work in hepatitis A research labs, or travel to or work in countries with a high rate of hepatitis A should be immunized. Adults who were previously unvaccinated and who anticipate close contact with an international adoptee during the first 60 days after arrival in the [place] from a country with a high rate of hepatitis A should be immunized.  Hepatitis B vaccine. Adults who wish to be protected from this disease, have certain high-risk conditions, may be exposed to blood or other infectious body fluids, are household contacts or sex partners of hepatitis B positive people, are clients or workers in certain care facilities, or travel to or work in countries with a high rate of hepatitis B should be immunized.  Haemophilus influenzae type b (Hib) vaccine. A previously unvaccinated person with asplenia or sickle cell disease or having a scheduled splenectomy should receive 1 dose of Hib vaccine. Regardless of previous immunization, a recipient of a hematopoietic stem cell  transplant should receive a 3-dose series 6-12 months after her successful transplant. Hib vaccine is not recommended for adults with HIV infection.  Ages 40 years and over  Blood pressure check.** / Every 1 to 2 years.  Lipid and cholesterol check.** / Every 5 years beginning at age 25 years.  Lung cancer screening. / Every year if you are aged 76-80 years and have a 30-pack-year history of smoking and currently smoke or have quit within the past 15 years. Yearly screening is stopped once you have quit smoking for at least 15 years or develop a health problem that would prevent you from having lung cancer treatment.  Clinical breast exam.** / Every year after age 33 years.  BRCA-related cancer risk assessment.** / For women who have family members with a BRCA-related cancer (breast, ovarian, tubal, or peritoneal cancers).  Mammogram.** / Every year beginning at age 65 years and continuing for as long as you are in good health. Consult with your health care provider.  Pap test.** / Every 3 years starting at age 44 years through age 11 or 50 years with 3 consecutive normal Pap tests. Testing can be stopped between 65 and 70 years with 3 consecutive normal Pap tests and no abnormal Pap or HPV tests in the past 10 years.  HPV screening.** / Every 3 years from ages 36 years through ages 56 or 28 years with a history of 3 consecutive normal Pap tests. Testing can be stopped between 65 and 70 years with 3 consecutive normal Pap tests and no abnormal Pap or HPV tests in the past 10 years.  Fecal occult blood test (FOBT) of stool. / Every year beginning at age 59 years and continuing until age 87 years. You  may not need to do this test if you get a colonoscopy every 10 years.  Flexible sigmoidoscopy or colonoscopy.** / Every 5 years for a flexible sigmoidoscopy or every 10 years for a colonoscopy beginning at age 27 years and continuing until age 28 years.  Hepatitis C blood test.** / For all people  born from [birth year] through 1965 and any individual with known risks for hepatitis C.  Osteoporosis screening.** / A one-time screening for women ages 74 years and over and women at risk for fractures or osteoporosis.  Skin self-exam. / Monthly.  Influenza vaccine. / Every year.  Tetanus, diphtheria, and acellular pertussis (Tdap/Td) vaccine.** / 1 dose of Td every 10 years.  Varicella vaccine.** / Consult your health care provider.  Zoster vaccine.** / 1 dose for adults aged 52 years or older.  Pneumococcal 13-valent conjugate (PCV13) vaccine.** / Consult your health care provider.  Pneumococcal polysaccharide (PPSV23) vaccine.** / 1 dose for all adults aged 54 years and older.  Meningococcal vaccine.** / Consult your health care provider.  Hepatitis A vaccine.** / Consult your health care provider.  Hepatitis B vaccine.** / Consult your health care provider.  Haemophilus influenzae type b (Hib) vaccine.** / Consult your health care provider. ** Family history and personal history of risk and conditions may change your health care provider's recommendations. Document Released: 01/28/2002 Document Revised: 04/18/2014 Document Reviewed: 04/29/2011 [facility] Patient Information 2015 [place], [place]. This information is not intended to replace advice given to you by your health care provider. Make sure you discuss any questions you have with your health care provider.

## 2014-07-27 NOTE — Progress Notes (Signed)
Patient ID: [name], female   DOB: 03/17/1936, 78 y.o.   MRN: [id number] Subjective:    [name] is a 78 y.o. female who presents for Medicare Initial Wellness Visit and Review DEXA / Osteoporosis  Preventive Screening-Counseling & Management  Tobacco History  Smoking status  . Former Smoker -- 1.00 packs/day for 15 years  . Types: Cigarettes  . Quit date: 12/16/1989  Smokeless tobacco  . Never Used     Current Problems (verified) Patient Active Problem List   Diagnosis Date Noted  . CHF (congestive heart failure) 04/30/2014  . Empyema, right 04/30/2014  . 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 Osteoporosis 01/27/2014  . Fibromyalgia 08/09/2013  . REACTIVE AIRWAY DISEASE 08/01/2010  . HYPOTHYROIDISM 03/20/2010  . DEPRESSION 03/20/2010  . HYPERTENSION 03/20/2010  . ESOPHAGEAL MOTILITY DISORDER 03/20/2010  . HYPERLIPIDEMIA 04/22/2009  . Cor athrscl-uns vessel 04/22/2009  . ATRIAL FIBRILLATION, PAROXYSMAL 04/22/2009  . ESOPHAGEAL STRICTURE 10/31/2004  . GERD 10/31/2004  . HIATAL HERNIA 10/06/2001    Medications Prior to Visit Current Outpatient Prescriptions on File Prior to Visit  Medication Sig Dispense Refill  . acetaminophen (TYLENOL) 325 MG tablet Take 2 tablets (650 mg total) by mouth every 6 (six) hours as needed for mild pain or moderate pain.      Marland Kitchen ALPRAZolam (XANAX) 0.5 MG tablet TAKE 2 TABLETS AT BEDTIME  60 tablet  1  . Ascorbic Acid (VITAMIN C PO) Take 1 tablet by mouth every morning.      . budesonide (PULMICORT) 0.5 MG/2ML nebulizer solution Take 2 mLs (0.5 mg total) by nebulization 2 (two) times daily. Dx 496  120 mL  6  . Calcium Carbonate-Vitamin D (CALCIUM 600+D) 600-400 MG-UNIT per tablet Take 1 tablet by mouth daily at 6 PM.       . cholecalciferol (VITAMIN D) 1000 UNITS tablet Take 1,000 Units by mouth 2 (two) times daily with breakfast and lunch.       . denosumab (PROLIA) 60 MG/ML SOLN injection Inject 60 mg into the skin every 6  (six) months. Administer in upper arm, thigh, or abdomen  1 mL  0  . dextromethorphan-guaiFENesin (MUCINEX DM) 30-600 MG per 12 hr tablet Take 1 tablet by mouth every 12 (twelve) hours. scheduled      . diltiazem (CARDIZEM CD) 120 MG 24 hr capsule Take 1 capsule (120 mg total) by mouth daily.  30 capsule  11  . fluticasone (FLONASE) 50 MCG/ACT nasal spray Place 2 sprays into both nostrils daily.      Marland Kitchen levothyroxine (SYNTHROID, LEVOTHROID) 25 MCG tablet Take 50 mcg by mouth daily before breakfast.      . loratadine (CLARITIN) 10 MG tablet Take 10 mg by mouth daily.       . metoprolol (LOPRESSOR) 50 MG tablet Take 50 mg by mouth 2 (two) times daily.      . montelukast (SINGULAIR) 10 MG tablet Take 10 mg by mouth at bedtime.      . Multiple Vitamin (MULTIVITAMIN WITH MINERALS) TABS Take 1 tablet by mouth daily.      . nitroGLYCERIN (NITROSTAT) 0.4 MG SL tablet Place 0.4 mg under the tongue every 5 (five) minutes as needed. For chest pain      . omega-3 acid ethyl esters (LOVAZA) 1 G capsule Take 2 g by mouth 2 (two) times daily.      Marland Kitchen omeprazole-sodium bicarbonate (ZEGERID) 40-1100 MG per capsule TAKE (1) CAPSULE DAILY  30 capsule  3  .  ondansetron (ZOFRAN) 4 MG tablet Take 4 mg by mouth every 8 (eight) hours as needed for nausea or vomiting.      . rivaroxaban (XARELTO) 20 MG TABS tablet Take 1 tablet (20 mg total) by mouth daily with supper.  30 tablet  0  . rosuvastatin (CRESTOR) 20 MG tablet Take 20 mg by mouth at bedtime.      . sertraline (ZOLOFT) 100 MG tablet TAKE 1 TABLET ONCE A DAY  30 tablet  2   No current facility-administered medications on file prior to visit.    Current Medications (verified) Current Outpatient Prescriptions  Medication Sig Dispense Refill  . acetaminophen (TYLENOL) 325 MG tablet Take 2 tablets (650 mg total) by mouth every 6 (six) hours as needed for mild pain or moderate pain.      Marland Kitchen ALPRAZolam (XANAX) 0.5 MG tablet TAKE 2 TABLETS AT BEDTIME  60 tablet  1  .  Ascorbic Acid (VITAMIN C PO) Take 1 tablet by mouth every morning.      . budesonide (PULMICORT) 0.5 MG/2ML nebulizer solution Take 2 mLs (0.5 mg total) by nebulization 2 (two) times daily. Dx 496  120 mL  6  . Calcium Carbonate-Vitamin D (CALCIUM 600+D) 600-400 MG-UNIT per tablet Take 1 tablet by mouth daily at 6 PM.       . cholecalciferol (VITAMIN D) 1000 UNITS tablet Take 1,000 Units by mouth 2 (two) times daily with breakfast and lunch.       . denosumab (PROLIA) 60 MG/ML SOLN injection Inject 60 mg into the skin every 6 (six) months. Administer in upper arm, thigh, or abdomen  1 mL  0  . dextromethorphan-guaiFENesin (MUCINEX DM) 30-600 MG per 12 hr tablet Take 1 tablet by mouth every 12 (twelve) hours. scheduled      . diltiazem (CARDIZEM CD) 120 MG 24 hr capsule Take 1 capsule (120 mg total) by mouth daily.  30 capsule  11  . fluticasone (FLONASE) 50 MCG/ACT nasal spray Place 2 sprays into both nostrils daily.      Marland Kitchen levothyroxine (SYNTHROID, LEVOTHROID) 25 MCG tablet Take 50 mcg by mouth daily before breakfast.      . loratadine (CLARITIN) 10 MG tablet Take 10 mg by mouth daily.       . metoprolol (LOPRESSOR) 50 MG tablet Take 50 mg by mouth 2 (two) times daily.      . montelukast (SINGULAIR) 10 MG tablet Take 10 mg by mouth at bedtime.      . Multiple Vitamin (MULTIVITAMIN WITH MINERALS) TABS Take 1 tablet by mouth daily.      . nitroGLYCERIN (NITROSTAT) 0.4 MG SL tablet Place 0.4 mg under the tongue every 5 (five) minutes as needed. For chest pain      . omega-3 acid ethyl esters (LOVAZA) 1 G capsule Take 2 g by mouth 2 (two) times daily.      Marland Kitchen omeprazole-sodium bicarbonate (ZEGERID) 40-1100 MG per capsule TAKE (1) CAPSULE DAILY  30 capsule  3  . ondansetron (ZOFRAN) 4 MG tablet Take 4 mg by mouth every 8 (eight) hours as needed for nausea or vomiting.      . rivaroxaban (XARELTO) 20 MG TABS tablet Take 1 tablet (20 mg total) by mouth daily with supper.  30 tablet  0  . rosuvastatin  (CRESTOR) 20 MG tablet Take 20 mg by mouth at bedtime.      . sertraline (ZOLOFT) 100 MG tablet TAKE 1 TABLET ONCE A DAY  30 tablet  2   No current facility-administered medications for this visit.     Allergies (verified) Aspirin; Captopril; Naproxen; Penicillins; Sulfonamide derivatives; and Clindamycin/lincomycin   PAST HISTORY  Family History Family History  Problem Relation Age of Onset  . Prostate cancer Brother   . Cancer Brother     PROSTATE  . Heart disease Mother   . Asthma Mother   . Congestive Heart Failure Mother   . Emphysema Sister   . COPD Sister   . Stroke Sister   . Heart disease Sister     Social History History  Substance Use Topics  . Smoking status: Former Smoker -- 1.00 packs/day for 15 years    Types: Cigarettes    Quit date: 12/16/1989  . Smokeless tobacco: Never Used  . Alcohol Use: No     Are there smokers in your home (other than you)? No  Risk Factors Current exercise habits: Exercise is limited by orthopedic condition(s): BACK PAIN.  Dietary issues discussed: NONE   Cardiac risk factors: advanced age (older than 40 for men, 41 for women), dyslipidemia, family history of premature cardiovascular disease, hypertension and sedentary lifestyle.  Depression Screen (Note: if answer to either of the following is "Yes", a more complete depression screening is indicated)   Over the past 2 weeks, have you felt down, depressed or hopeless? Yes  Over the past 2 weeks, have you felt little interest or pleasure in doing things? No  Have you lost interest or pleasure in daily life? No  Do you often feel hopeless? No  Do you cry easily over simple problems? No  Activities of Daily Living In your present state of health, do you have any difficulty performing the following activities?:  Driving? No Managing money?  No Feeding yourself? No Getting from bed to chair? No  Climbing a flight of stairs? Yes Preparing food and eating?: No Bathing or  showering? No Getting dressed: No Getting to the toilet? No Using the toilet:No Moving around from place to place: No In the past year have you fallen or had a near fall?:No   Are you sexually active?  No  Do you have more than one partner?  No  Hearing Difficulties: Yes Do you often ask people to speak up or repeat themselves? Yes Do you experience ringing or noises in your ears? No Do you have difficulty understanding soft or whispered voices? Yes   Do you feel that you have a problem with memory? No  Do you often misplace items? No  Do you feel safe at home?  Yes  Cognitive Testing  Alert? Yes  Normal Appearance?Yes  Oriented to person? Yes    Place? Yes   Time? Yes  Recall of three objects?  Yes  Can perform simple calculations? Yes  Displays appropriate judgment?Yes  Can read the correct time from a watch face?Yes   Advanced Directives have been discussed with the patient? Yes  List the Names of Other Physician/Practitioners you currently use: 1.  Pulmonary - Dr Alfonso Patten. [place] 2.  Cardiology - Dr Jenkins [place] 3.  GI - Dr Melinda Crutch any recent Medical Services you may have received from other than [facility] providers in the past year (date may be approximate).  Immunization History  Administered Date(s) Administered  . Influenza Split 08/28/2012  . Influenza Whole 09/18/2009  . Influenza,inj,Quad PF,36+ Mos 10/05/2013  . Pneumococcal Conjugate-13 01/13/2014  . Pneumococcal Polysaccharide-23 08/28/2012    Screening Tests Health Maintenance  Topic Date Due  .  Influenza Vaccine  07/16/2014  . Colonoscopy  08/29/2014  . Tetanus/tdap  08/16/2021  . Pneumococcal Polysaccharide Vaccine Age 29 And Over  Completed  . Zostavax  Completed    All answers were reviewed with the patient and necessary referrals were made:  Cherre Robins, [facility]   07/27/2014   History reviewed: allergies, current medications, past family history, past medical history, past social history, past  surgical history and problem list    Objective:   Body mass index is 29.88 kg/(m^2). BP 122/70  Pulse 70  Ht 4\' 11"  (1.499 m)  Wt 148 lb (67.132 kg)  BMI 29.88 kg/m2   DEXA Results Date of Test T-Score for AP Spine L1-L4 T-Score for Total Left Hip T-Score for Total Right Hip  07/27/2014 -1.5 -1.6 -1.9  07/15/2012 -1.8` -1.6 -2.0  05/29/2011 -2.2 -1.7 -2.2  02/16/2007 -2.1 -1.1 -1.6   Lowest T-Score at L-1 was -3.2 *(05/29/2011)  Assessment:     Osteoporosis - improved BMD since starting Prolia Initial Medicare Wellness Visit     Plan:     During the course of the visit the patient was educated and counseled about appropriate screening and preventive services including:    Pneumococcal vaccine - UTD  Influenza vaccine - UTD  Hepatitis B vaccine - discussed  Td vaccine - UTD  Screening mammography - appt made today  Screening Pap smear and pelvic exam   Bone densitometry screening - done today  Colorectal cancer screening - UTD  Glaucoma screening - patient to get appt   Referral made for audiologist  Advanced directives: has an advanced directive - a copy HAS NOT been provided., patient to bring in [place] 60mg  SQ every 6 months - injection brought in by patient today and given in office.   continue calcium 1200mg  daily through supplementation or diet.    recommend weight bearing exercise - 30 minutes at least 4 days per week.    Counseled and educated about fall risk and prevention.  Recheck DEXA:  2 years  Patient Instructions (the written plan) was given to the patient.  Medicare Attestation I have personally reviewed: The patient's medical and social history Their use of alcohol, tobacco or illicit drugs Their current medications and supplements The patient's functional ability including ADLs,fall risks, home safety risks, cognitive, and hearing and visual impairment Diet and physical activities Evidence for  depression or mood disorders  The patient's weight, height, BMI, and BP/HR have been recorded in the chart.  I have made referrals, counseling, and provided education to the patient based on review of the above and I have provided the patient with a written personalized care plan for preventive services.     Cherre Robins, [facility]   07/27/2014

## 2014-08-01 ENCOUNTER — Telehealth: Payer: Self-pay | Admitting: *Deleted

## 2014-08-01 NOTE — Telephone Encounter (Signed)
Having dysuria - knows it is a uti - cant come in because of her foot [facility]  Allergic to PCN, Sulfa, and clindamycin  Can we call in cipro

## 2014-08-01 NOTE — Telephone Encounter (Signed)
Cipro 500 twice daily for 3 days

## 2014-08-02 MED ORDER — CIPROFLOXACIN HCL 500 MG PO TABS: 500.0000 mg | ORAL_TABLET | Freq: Two times a day (BID) | ORAL | Status: DC

## 2014-08-02 NOTE — Telephone Encounter (Signed)
Sent in and rx aware

## 2014-08-05 ENCOUNTER — Other Ambulatory Visit: Payer: Self-pay | Admitting: Family Medicine

## 2014-08-15 NOTE — Progress Notes (Signed)
Not sure I staffed this patient on this day. I am just attesting the PA note  Dr. Brand Males, M.D., [facility].C.P Pulmonary and Critical Care Medicine Staff Physician [facility] Pager: [phone], If no answer or between  15:00h - 7:00h: call [phone]  08/15/2014 7:58 PM

## 2014-08-16 ENCOUNTER — Telehealth: Payer: Self-pay | Admitting: Cardiology

## 2014-08-16 ENCOUNTER — Telehealth: Payer: Self-pay | Admitting: *Deleted

## 2014-08-16 MED ORDER — RIVAROXABAN 20 MG PO TABS: 20.0000 mg | ORAL_TABLET | Freq: Every day | ORAL | Status: DC

## 2014-08-16 NOTE — Telephone Encounter (Signed)
Pt aware.

## 2014-08-16 NOTE — Telephone Encounter (Signed)
New message     Pt has 2 xeralto pills left.  Did we get prior approval from ins coompany?

## 2014-08-16 NOTE — Telephone Encounter (Signed)
According to documentation in the chart from 07/08/14 by Trixie Dredge, RN Xarelto had been denied and she was starting the appeal process.  Will forward information to Ivin Booty to follow up on what has occurred r/t this.

## 2014-08-18 NOTE — Telephone Encounter (Signed)
Faxed form to insurance.

## 2014-08-30 ENCOUNTER — Other Ambulatory Visit: Payer: Self-pay | Admitting: Family Medicine

## 2014-08-31 ENCOUNTER — Telehealth: Payer: Self-pay | Admitting: *Deleted

## 2014-08-31 NOTE — Telephone Encounter (Signed)
Open in error

## 2014-08-31 NOTE — Telephone Encounter (Signed)
This is okay to refill 

## 2014-08-31 NOTE — Telephone Encounter (Signed)
Last seen 06/20/14, last filled 08/01/14.Route to pool. Call into [facility]

## 2014-08-31 NOTE — Telephone Encounter (Signed)
Rx called to [facility] 

## 2014-08-31 NOTE — Telephone Encounter (Signed)
RN contacted  [employer] [facility] -prior authorization XARELTO  Has been approved until 08/24/15  Called left message for patient to call back

## 2014-09-01 ENCOUNTER — Telehealth: Payer: Self-pay | Admitting: Cardiology

## 2014-09-01 NOTE — Telephone Encounter (Signed)
Returned a call to patient informing her that looks like [name] was calling to notify her that her Xarelto has been approved. Patient thanked me for calling and states that she did receive a notice of approval letter from the insurance company.

## 2014-09-01 NOTE — Telephone Encounter (Signed)
Pt was returning [name]'s call from yesterday. Pt was not sure what it was in regards to. Please call  Thanks

## 2014-09-12 ENCOUNTER — Ambulatory Visit (INDEPENDENT_AMBULATORY_CARE_PROVIDER_SITE_OTHER): Payer: PRIVATE HEALTH INSURANCE

## 2014-09-12 DIAGNOSIS — Z23 Encounter for immunization: Secondary | ICD-10-CM

## 2014-09-20 ENCOUNTER — Encounter: Payer: Self-pay | Admitting: Family Medicine

## 2014-09-20 ENCOUNTER — Ambulatory Visit (INDEPENDENT_AMBULATORY_CARE_PROVIDER_SITE_OTHER): Payer: PRIVATE HEALTH INSURANCE | Admitting: Family Medicine

## 2014-09-20 VITALS — BP 152/84 | HR 63 | Temp 97.4°F | Ht 59.0 in | Wt 148.0 lb

## 2014-09-20 DIAGNOSIS — K219 Gastro-esophageal reflux disease without esophagitis: Secondary | ICD-10-CM

## 2014-09-20 DIAGNOSIS — M25571 Pain in right ankle and joints of right foot: Secondary | ICD-10-CM

## 2014-09-20 DIAGNOSIS — I4891 Unspecified atrial fibrillation: Secondary | ICD-10-CM

## 2014-09-20 DIAGNOSIS — E785 Hyperlipidemia, unspecified: Secondary | ICD-10-CM

## 2014-09-20 DIAGNOSIS — I10 Essential (primary) hypertension: Secondary | ICD-10-CM

## 2014-09-20 DIAGNOSIS — E559 Vitamin D deficiency, unspecified: Secondary | ICD-10-CM

## 2014-09-20 DIAGNOSIS — E039 Hypothyroidism, unspecified: Secondary | ICD-10-CM

## 2014-09-20 LAB — POCT CBC
Granulocyte percent: 58.1 %G (ref 37–80)
HCT, POC: 41.9 % (ref 37.7–47.9)
Hemoglobin: 13.7 g/dL (ref 12.2–16.2)
Lymph, poc: 3.1 (ref 0.6–3.4)
MCH, POC: 28.5 pg (ref 27–31.2)
MCHC: 32.7 g/dL (ref 31.8–35.4)
MCV: 87.1 fL (ref 80–97)
MPV: 6.4 fL (ref 0–99.8)
POC Granulocyte: 4.9 (ref 2–6.9)
POC LYMPH PERCENT: 37.5 %L (ref 10–50)
Platelet Count, POC: 331 10*3/uL (ref 142–424)
RBC: 4.8 M/uL (ref 4.04–5.48)
RDW, POC: 13.9 %
WBC: 8.4 10*3/uL (ref 4.6–10.2)

## 2014-09-20 MED ORDER — ALPRAZOLAM 0.5 MG PO TABS: ORAL_TABLET | ORAL | Status: DC

## 2014-09-20 NOTE — Progress Notes (Signed)
Subjective:    Patient ID: [name], female    DOB: 11/10/1936, 78 y.o.   MRN: [id number]  HPI Pt here for follow up and management of chronic medical problems. The patient complains today of some arthralgias in her right foot. She also has insomnia. She has multiple medical problems and has multiple joint surgery history. She is due for more surgery on her left ankle i.e. screws will be removed. The patient is to get her mammogram and a colonoscopy. She is also due to return FOBT. She'll get lab work done today per        Patient Active Problem List   Diagnosis Date Noted  . CHF (congestive heart failure) 04/30/2014  . Empyema, right 04/30/2014  . 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 Osteoporosis 01/27/2014  . Fibromyalgia 08/09/2013  . REACTIVE AIRWAY DISEASE 08/01/2010  . Hypothyroidism 03/20/2010  . DEPRESSION 03/20/2010  . HTN (hypertension) 03/20/2010  . ESOPHAGEAL MOTILITY DISORDER 03/20/2010  . Hyperlipidemia 04/22/2009  . Cor athrscl-uns vessel 04/22/2009  . ATRIAL FIBRILLATION, PAROXYSMAL 04/22/2009  . ESOPHAGEAL STRICTURE 10/31/2004  . GERD 10/31/2004  . HIATAL HERNIA 10/06/2001   Outpatient Encounter Prescriptions as of 09/20/2014  Medication Sig  . acetaminophen (TYLENOL) 325 MG tablet Take 2 tablets (650 mg total) by mouth every 6 (six) hours as needed for mild pain or moderate pain.  Marland Kitchen ALPRAZolam (XANAX) 0.5 MG tablet TAKE 2 TABLETS AT BEDTIME  . Ascorbic Acid (VITAMIN C PO) Take 1 tablet by mouth every morning.  . budesonide (PULMICORT) 0.5 MG/2ML nebulizer solution Take 2 mLs (0.5 mg total) by nebulization 2 (two) times daily. Dx 496  . Calcium Carbonate-Vitamin D (CALCIUM 600+D) 600-400 MG-UNIT per tablet Take 1 tablet by mouth daily at 6 PM.   . cholecalciferol (VITAMIN D) 1000 UNITS tablet Take 1,000 Units by mouth 2 (two) times daily with breakfast and lunch.   . denosumab (PROLIA) 60 MG/ML SOLN injection Inject 60 mg into the skin every 6  (six) months. Administer in upper arm, thigh, or abdomen  . dextromethorphan-guaiFENesin (MUCINEX DM) 30-600 MG per 12 hr tablet Take 1 tablet by mouth every 12 (twelve) hours. scheduled  . diltiazem (CARDIZEM CD) 120 MG 24 hr capsule Take 1 capsule (120 mg total) by mouth daily.  . fluticasone (FLONASE) 50 MCG/ACT nasal spray 2 SPRAYS IN EACH NOSTRIL ONCE A DAY  . levothyroxine (SYNTHROID, LEVOTHROID) 25 MCG tablet Take 50 mcg by mouth daily before breakfast.  . loratadine (CLARITIN) 10 MG tablet Take 10 mg by mouth daily.   . metoprolol (LOPRESSOR) 50 MG tablet Take 50 mg by mouth 2 (two) times daily.  . montelukast (SINGULAIR) 10 MG tablet Take 10 mg by mouth at bedtime.  . Multiple Vitamin (MULTIVITAMIN WITH MINERALS) TABS Take 1 tablet by mouth daily.  Marland Kitchen omega-3 acid ethyl esters (LOVAZA) 1 G capsule Take 2 g by mouth 2 (two) times daily.  Marland Kitchen omeprazole-sodium bicarbonate (ZEGERID) 40-1100 MG per capsule TAKE (1) CAPSULE DAILY  . ondansetron (ZOFRAN) 4 MG tablet Take 4 mg by mouth every 8 (eight) hours as needed for nausea or vomiting.  . rivaroxaban (XARELTO) 20 MG TABS tablet Take 1 tablet (20 mg total) by mouth daily with supper.  . rosuvastatin (CRESTOR) 20 MG tablet Take 20 mg by mouth at bedtime.  . sertraline (ZOLOFT) 100 MG tablet TAKE 1 TABLET ONCE A DAY  . [DISCONTINUED] fluticasone (FLONASE) 50 MCG/ACT nasal spray Place 2 sprays into both nostrils daily.  Marland Kitchen  nitroGLYCERIN (NITROSTAT) 0.4 MG SL tablet Place 0.4 mg under the tongue every 5 (five) minutes as needed. For chest pain  . [DISCONTINUED] ciprofloxacin (CIPRO) 500 MG tablet Take 1 tablet (500 mg total) by mouth 2 (two) times daily.    Review of Systems  Constitutional: Negative.   HENT: Negative.   Eyes: Negative.   Respiratory: Negative.   Cardiovascular: Negative.   Gastrointestinal: Negative.   Endocrine: Negative.   Genitourinary: Negative.   Musculoskeletal: Positive for arthralgias (right foot pain- Dr Doran Durand).   Skin: Negative.   Allergic/Immunologic: Negative.   Neurological: Negative.   Hematological: Negative.   Psychiatric/Behavioral: Negative.        Not sleeping well       Objective:   Physical Exam  Nursing note and vitals reviewed. Constitutional: She is oriented to person, place, and time. She appears well-developed and well-nourished. No distress.  The patient has a positive demeanor , is alert, and is doing well considering all that she has been through.  HENT:  Head: Normocephalic and atraumatic.  Right Ear: External ear normal.  Left Ear: External ear normal.  Nose: Nose normal.  Mouth/Throat: Oropharynx is clear and moist.  Eyes: Conjunctivae and EOM are normal. Pupils are equal, round, and reactive to light. Right eye exhibits no discharge. Left eye exhibits no discharge. No scleral icterus.  Neck: Normal range of motion. Neck supple. No thyromegaly present.  Cardiovascular: Normal rate, regular rhythm, normal heart sounds and intact distal pulses.  Exam reveals no gallop and no friction rub.   No murmur heard. At 72 per min  Pulmonary/Chest: Effort normal and breath sounds normal. No respiratory distress. She has no wheezes. She has no rales. She exhibits no tenderness.  Abdominal: Soft. Bowel sounds are normal. She exhibits no mass. There is no tenderness. There is no rebound and no guarding.  Musculoskeletal: Normal range of motion. She exhibits no edema and no tenderness.  Minimal edema in right foot with good pulse  Lymphadenopathy:    She has no cervical adenopathy.  Neurological: She is alert and oriented to person, place, and time. No cranial nerve deficit.  Reflexes decreased right lower extremity  Skin: Skin is warm and dry. No rash noted.  Psychiatric: She has a normal mood and affect. Her behavior is normal. Judgment and thought content normal.   BP 152/84  Pulse 63  Temp(Src) 97.4 F (36.3 C) (Oral)  Ht '4\' 11"'  (1.499 m)  Wt 148 lb (67.132 kg)  BMI 29.88  kg/m2        Assessment & Plan:  1. Atrial fibrillation, unspecified - POCT CBC  2. Hyperlipidemia - POCT CBC - NMR, lipoprofile  3. Essential hypertension - POCT CBC - BMP8+EGFR - Hepatic function panel  4. Hypothyroidism, unspecified hypothyroidism type - POCT CBC - Thyroid Panel With TSH  5. Gastroesophageal reflux disease, esophagitis presence not specified - POCT CBC  6. Vitamin D deficiency - Vit D  25 hydroxy (rtn osteoporosis monitoring)  7. Pain, joint, ankle and foot, right  Meds ordered this encounter  Medications  . ALPRAZolam (XANAX) 0.5 MG tablet    Sig: TAKE 2 TABLETS AT BEDTIME    Dispense:  60 tablet    Refill:  1   Patient Instructions                       Medicare Annual Wellness Visit  [facility] and the medical providers at [facility] strive to bring  you the best medical care.  In doing so we not only want to address your current medical conditions and concerns but also to detect new conditions early and prevent illness, disease and health-related problems.    Medicare offers a yearly Wellness Visit which allows our clinical staff to assess your need for preventative services including immunizations, lifestyle education, counseling to decrease risk of preventable diseases and screening for fall risk and other medical concerns.    This visit is provided free of charge (no copay) for all Medicare recipients. The clinical pharmacists at [facility] have begun to conduct these Wellness Visits which will also include a thorough review of all your medications.    As you primary medical provider recommend that you make an appointment for your Annual Wellness Visit if you have not done so already this year.  You may set up this appointment before you leave today or you may call back ([phone]) and schedule an appointment.  Please make sure when you call that you mention that you are scheduling your Annual  Wellness Visit with the clinical pharmacist so that the appointment may be made for the proper length of time.     Continue current medications. Continue good therapeutic lifestyle changes which include good diet and exercise. Fall precautions discussed with patient. If an FOBT was given today- please return it to our front desk. If you are over 14 years old - you may need Prevnar 64 or the adult Pneumonia vaccine.  Flu Shots will be available at our office starting mid- September. Please call and schedule a FLU CLINIC APPOINTMENT.   Do not forget to get your mammogram, colonoscopy, and return FOBT. Try melatonin 3 mg over-the-counter for sleep We will call you with your lab work  results once those results are available Continue followups with the orthopedist and cardiologist   Arrie Senate MD

## 2014-09-20 NOTE — Patient Instructions (Addendum)
Medicare Annual Wellness Visit  [facility] and the medical providers at [facility] strive to bring you the best medical care.  In doing so we not only want to address your current medical conditions and concerns but also to detect new conditions early and prevent illness, disease and health-related problems.    Medicare offers a yearly Wellness Visit which allows our clinical staff to assess your need for preventative services including immunizations, lifestyle education, counseling to decrease risk of preventable diseases and screening for fall risk and other medical concerns.    This visit is provided free of charge (no copay) for all Medicare recipients. The clinical pharmacists at [facility] have begun to conduct these Wellness Visits which will also include a thorough review of all your medications.    As you primary medical provider recommend that you make an appointment for your Annual Wellness Visit if you have not done so already this year.  You may set up this appointment before you leave today or you may call back ([phone]) and schedule an appointment.  Please make sure when you call that you mention that you are scheduling your Annual Wellness Visit with the clinical pharmacist so that the appointment may be made for the proper length of time.     Continue current medications. Continue good therapeutic lifestyle changes which include good diet and exercise. Fall precautions discussed with patient. If an FOBT was given today- please return it to our front desk. If you are over 81 years old - you may need Prevnar 9 or the adult Pneumonia vaccine.  Flu Shots will be available at our office starting mid- September. Please call and schedule a FLU CLINIC APPOINTMENT.   Do not forget to get your mammogram, colonoscopy, and return FOBT. Try melatonin 3 mg over-the-counter for sleep We will call you with your lab work   results once those results are available Continue followups with the orthopedist and cardiologist

## 2014-09-22 LAB — THYROID PANEL WITH TSH
Free Thyroxine Index: 2 (ref 1.2–4.9)
T3 Uptake Ratio: 26 % (ref 24–39)
T4, Total: 7.6 ug/dL (ref 4.5–12.0)
TSH: 3.11 u[IU]/mL (ref 0.450–4.500)

## 2014-09-22 LAB — BMP8+EGFR
BUN/Creatinine Ratio: 18 (ref 11–26)
BUN: 14 mg/dL (ref 8–27)
CO2: 25 mmol/L (ref 18–29)
Calcium: 9.5 mg/dL (ref 8.7–10.3)
Chloride: 93 mmol/L — ABNORMAL LOW (ref 97–108)
Creatinine, Ser: 0.79 mg/dL (ref 0.57–1.00)
GFR calc Af Amer: 84 mL/min/{1.73_m2} (ref >59)
GFR calc non Af Amer: 73 mL/min/{1.73_m2} (ref >59)
Glucose: 88 mg/dL (ref 65–99)
Potassium: 4.7 mmol/L (ref 3.5–5.2)
Sodium: 136 mmol/L (ref 134–144)

## 2014-09-22 LAB — NMR, LIPOPROFILE
Cholesterol: 162 mg/dL (ref 100–199)
HDL Cholesterol by NMR: 59 mg/dL (ref >39)
HDL Particle Number: 33 umol/L (ref >=30.5)
LDL Particle Number: 773 nmol/L (ref <1000)
LDL Size: 21 nm (ref >20.5)
LDLC SERPL CALC-MCNC: 72 mg/dL (ref 0–99)
LP-IR Score: 36 (ref <=45)
Small LDL Particle Number: 256 nmol/L (ref <=527)
Triglycerides by NMR: 153 mg/dL — ABNORMAL HIGH (ref 0–149)

## 2014-09-22 LAB — HEPATIC FUNCTION PANEL
ALT: 25 IU/L (ref 0–32)
AST: 27 IU/L (ref 0–40)
Albumin: 4.7 g/dL (ref 3.5–4.8)
Alkaline Phosphatase: 89 IU/L (ref 39–117)
Bilirubin, Direct: 0.13 mg/dL (ref 0.00–0.40)
Total Bilirubin: 0.5 mg/dL (ref 0.0–1.2)
Total Protein: 7.9 g/dL (ref 6.0–8.5)

## 2014-09-22 LAB — VITAMIN D 25 HYDROXY (VIT D DEFICIENCY, FRACTURES): Vit D, 25-Hydroxy: 50.6 ng/mL (ref 30.0–100.0)

## 2014-09-28 ENCOUNTER — Ambulatory Visit: Payer: Medicaid Other | Admitting: Cardiology

## 2014-10-03 ENCOUNTER — Other Ambulatory Visit: Payer: Self-pay | Admitting: Nurse Practitioner

## 2014-10-03 ENCOUNTER — Other Ambulatory Visit: Payer: Self-pay | Admitting: Family Medicine

## 2014-10-18 ENCOUNTER — Other Ambulatory Visit: Payer: PRIVATE HEALTH INSURANCE

## 2014-10-18 DIAGNOSIS — Z1212 Encounter for screening for malignant neoplasm of rectum: Secondary | ICD-10-CM

## 2014-10-19 ENCOUNTER — Other Ambulatory Visit: Payer: PRIVATE HEALTH INSURANCE

## 2014-10-19 NOTE — Addendum Note (Signed)
Addended by: Pollyann Kennedy F on: 10/19/2014 04:50 PM   Modules accepted: Orders

## 2014-10-20 LAB — FECAL OCCULT BLOOD, IMMUNOCHEMICAL: Fecal Occult Bld: POSITIVE — AB

## 2014-10-26 ENCOUNTER — Telehealth: Payer: Self-pay | Admitting: Cardiology

## 2014-10-26 NOTE — Telephone Encounter (Signed)
Pt need to have surgery,she need to know what to do about her Xarelto.

## 2014-10-26 NOTE — Telephone Encounter (Signed)
Patient is to have "hardware removal" - screws taken out - of foot. She had surgery in spring and is still in pain.   Dr. Doran Durand with [facility] will be doing same-day surgery.   Surgery is scheduled for 11/19 - she needs to know what to do about holding xarelto.   Patient states her surgery may be delayed - she had lab work done at PCP - positive hemoccult. Will f/up on that tomorrow.   Routed to Dr. Percival Spanish to advise on xarelto.

## 2014-10-27 ENCOUNTER — Telehealth: Payer: Self-pay | Admitting: Family Medicine

## 2014-10-27 ENCOUNTER — Other Ambulatory Visit (INDEPENDENT_AMBULATORY_CARE_PROVIDER_SITE_OTHER): Payer: PRIVATE HEALTH INSURANCE

## 2014-10-27 DIAGNOSIS — K921 Melena: Secondary | ICD-10-CM

## 2014-10-27 LAB — POCT CBC
Granulocyte percent: 51.3 %G (ref 37–80)
HCT, POC: 36 % — AB (ref 37.7–47.9)
Hemoglobin: 12.2 g/dL (ref 12.2–16.2)
Lymph, poc: 2.9 (ref 0.6–3.4)
MCH, POC: 30 pg (ref 27–31.2)
MCHC: 33.9 g/dL (ref 31.8–35.4)
MCV: 88.5 fL (ref 80–97)
MPV: 6.7 fL (ref 0–99.8)
POC Granulocyte: 3.5 (ref 2–6.9)
POC LYMPH PERCENT: 43.2 %L (ref 10–50)
Platelet Count, POC: 334 10*3/uL (ref 142–424)
RBC: 4.1 M/uL (ref 4.04–5.48)
RDW, POC: 14.3 %
WBC: 6.8 10*3/uL (ref 4.6–10.2)

## 2014-10-27 NOTE — Telephone Encounter (Signed)
Aware of results. 

## 2014-10-27 NOTE — Progress Notes (Signed)
Lab only 

## 2014-10-27 NOTE — Telephone Encounter (Signed)
-----   Message from Chipper Herb, MD sent at 10/27/2014 12:50 PM EST ----- The CBC has a normal white blood cell count. The hemoglobin is slightly decreased but consistent with past readings. The platelet count is adequate. Please forward this report to any of her specialist that she needs it forwarded to

## 2014-10-28 ENCOUNTER — Encounter: Payer: Self-pay | Admitting: *Deleted

## 2014-10-28 NOTE — Telephone Encounter (Signed)
She can hold the Xarelto two days prior to surgery and resume when OK with the surgeon.

## 2014-10-28 NOTE — Telephone Encounter (Signed)
Spoke with patient and provided information per Dr. Percival Spanish. Patient voiced understanding. Letter composed and faxed to Dr. Nona Dell office

## 2014-10-31 NOTE — Progress Notes (Signed)
Pt was in hospital 5/15 with pneumonia requiring vatz- Much better-slight sob-no oxygen Will need istat Dr Warren Lacy ok surgery and holding xarlalto

## 2014-11-01 ENCOUNTER — Telehealth: Payer: Self-pay | Admitting: Cardiology

## 2014-11-01 ENCOUNTER — Other Ambulatory Visit: Payer: PRIVATE HEALTH INSURANCE

## 2014-11-01 DIAGNOSIS — Z1212 Encounter for screening for malignant neoplasm of rectum: Secondary | ICD-10-CM

## 2014-11-01 NOTE — Progress Notes (Signed)
LAB ONLY 

## 2014-11-02 ENCOUNTER — Other Ambulatory Visit: Payer: Self-pay | Admitting: Family Medicine

## 2014-11-02 ENCOUNTER — Other Ambulatory Visit: Payer: Self-pay | Admitting: Internal Medicine

## 2014-11-02 ENCOUNTER — Other Ambulatory Visit: Payer: Self-pay | Admitting: Physician Assistant

## 2014-11-02 ENCOUNTER — Other Ambulatory Visit: Payer: Self-pay | Admitting: Nurse Practitioner

## 2014-11-02 NOTE — Telephone Encounter (Signed)
Last seen 09/20/14 DWM

## 2014-11-03 ENCOUNTER — Ambulatory Visit (HOSPITAL_BASED_OUTPATIENT_CLINIC_OR_DEPARTMENT_OTHER): Payer: Medicare Other | Admitting: Certified Registered"

## 2014-11-03 ENCOUNTER — Ambulatory Visit (HOSPITAL_BASED_OUTPATIENT_CLINIC_OR_DEPARTMENT_OTHER)
Admission: RE | Admit: 2014-11-03 | Discharge: 2014-11-03 | Disposition: A | Discharge status: Home / Self Care | Payer: Medicare Other | Source: Ambulatory Visit | Attending: Orthopedic Surgery | Admitting: Orthopedic Surgery

## 2014-11-03 ENCOUNTER — Encounter (HOSPITAL_BASED_OUTPATIENT_CLINIC_OR_DEPARTMENT_OTHER)
Admission: RE | Disposition: A | Discharge status: Home / Self Care | Payer: Self-pay | Source: Ambulatory Visit | Attending: Orthopedic Surgery

## 2014-11-03 ENCOUNTER — Encounter (HOSPITAL_BASED_OUTPATIENT_CLINIC_OR_DEPARTMENT_OTHER): Payer: Self-pay | Admitting: Certified Registered"

## 2014-11-03 DIAGNOSIS — Z8249 Family history of ischemic heart disease and other diseases of the circulatory system: Secondary | ICD-10-CM | POA: Insufficient documentation

## 2014-11-03 DIAGNOSIS — Z87891 Personal history of nicotine dependence: Secondary | ICD-10-CM | POA: Insufficient documentation

## 2014-11-03 DIAGNOSIS — Z882 Allergy status to sulfonamides status: Secondary | ICD-10-CM | POA: Insufficient documentation

## 2014-11-03 DIAGNOSIS — Z886 Allergy status to analgesic agent status: Secondary | ICD-10-CM | POA: Insufficient documentation

## 2014-11-03 DIAGNOSIS — I252 Old myocardial infarction: Secondary | ICD-10-CM | POA: Diagnosis not present

## 2014-11-03 DIAGNOSIS — J449 Chronic obstructive pulmonary disease, unspecified: Secondary | ICD-10-CM | POA: Insufficient documentation

## 2014-11-03 DIAGNOSIS — Z8674 Personal history of sudden cardiac arrest: Secondary | ICD-10-CM | POA: Diagnosis not present

## 2014-11-03 DIAGNOSIS — E559 Vitamin D deficiency, unspecified: Secondary | ICD-10-CM | POA: Diagnosis not present

## 2014-11-03 DIAGNOSIS — Z888 Allergy status to other drugs, medicaments and biological substances status: Secondary | ICD-10-CM | POA: Insufficient documentation

## 2014-11-03 DIAGNOSIS — I4891 Unspecified atrial fibrillation: Secondary | ICD-10-CM | POA: Diagnosis not present

## 2014-11-03 DIAGNOSIS — E039 Hypothyroidism, unspecified: Secondary | ICD-10-CM | POA: Diagnosis not present

## 2014-11-03 DIAGNOSIS — F418 Other specified anxiety disorders: Secondary | ICD-10-CM | POA: Insufficient documentation

## 2014-11-03 DIAGNOSIS — I10 Essential (primary) hypertension: Secondary | ICD-10-CM | POA: Insufficient documentation

## 2014-11-03 DIAGNOSIS — M1990 Unspecified osteoarthritis, unspecified site: Secondary | ICD-10-CM | POA: Diagnosis not present

## 2014-11-03 DIAGNOSIS — K5660 Unspecified intestinal obstruction: Secondary | ICD-10-CM | POA: Insufficient documentation

## 2014-11-03 DIAGNOSIS — Z8661 Personal history of infections of the central nervous system: Secondary | ICD-10-CM | POA: Insufficient documentation

## 2014-11-03 DIAGNOSIS — K219 Gastro-esophageal reflux disease without esophagitis: Secondary | ICD-10-CM | POA: Diagnosis not present

## 2014-11-03 DIAGNOSIS — Z880 Allergy status to penicillin: Secondary | ICD-10-CM | POA: Insufficient documentation

## 2014-11-03 DIAGNOSIS — Z8042 Family history of malignant neoplasm of prostate: Secondary | ICD-10-CM | POA: Diagnosis not present

## 2014-11-03 DIAGNOSIS — I2510 Atherosclerotic heart disease of native coronary artery without angina pectoris: Secondary | ICD-10-CM | POA: Diagnosis not present

## 2014-11-03 DIAGNOSIS — Z823 Family history of stroke: Secondary | ICD-10-CM | POA: Diagnosis not present

## 2014-11-03 DIAGNOSIS — M797 Fibromyalgia: Secondary | ICD-10-CM | POA: Diagnosis not present

## 2014-11-03 DIAGNOSIS — Z825 Family history of asthma and other chronic lower respiratory diseases: Secondary | ICD-10-CM | POA: Diagnosis not present

## 2014-11-03 DIAGNOSIS — E785 Hyperlipidemia, unspecified: Secondary | ICD-10-CM | POA: Diagnosis not present

## 2014-11-03 DIAGNOSIS — T8484XA Pain due to internal orthopedic prosthetic devices, implants and grafts, initial encounter: Secondary | ICD-10-CM | POA: Insufficient documentation

## 2014-11-03 DIAGNOSIS — J45909 Unspecified asthma, uncomplicated: Secondary | ICD-10-CM | POA: Insufficient documentation

## 2014-11-03 HISTORY — PX: HARDWARE REMOVAL: SHX979

## 2014-11-03 LAB — POCT I-STAT, CHEM 8
BUN: 15 mg/dL (ref 6–23)
Calcium, Ion: 1.07 mmol/L — ABNORMAL LOW (ref 1.13–1.30)
Chloride: 103 mEq/L (ref 96–112)
Creatinine, Ser: 0.6 mg/dL (ref 0.50–1.10)
Glucose, Bld: 93 mg/dL (ref 70–99)
HCT: 38 % (ref 36.0–46.0)
Hemoglobin: 12.9 g/dL (ref 12.0–15.0)
Potassium: 4.2 mEq/L (ref 3.7–5.3)
Sodium: 138 mEq/L (ref 137–147)
TCO2: 23 mmol/L (ref 0–100)

## 2014-11-03 LAB — FECAL OCCULT BLOOD, IMMUNOCHEMICAL: Fecal Occult Bld: POSITIVE — AB

## 2014-11-03 SURGERY — REMOVAL, HARDWARE
Anesthesia: General | Site: Foot | Laterality: Right

## 2014-11-03 MED ORDER — TRAMADOL HCL 50 MG PO TABS: 50.0000 mg | ORAL_TABLET | Freq: Four times a day (QID) | ORAL | Status: DC | PRN

## 2014-11-03 MED ORDER — ACETAMINOPHEN 500 MG PO TABS
ORAL_TABLET | ORAL | Status: AC
  Filled 2014-11-03: qty 2

## 2014-11-03 MED ORDER — LIDOCAINE HCL (CARDIAC) 20 MG/ML IV SOLN
INTRAVENOUS | Status: DC | PRN
  Administered 2014-11-03: 30 mg via INTRAVENOUS

## 2014-11-03 MED ORDER — ONDANSETRON HCL 4 MG/2ML IJ SOLN: 4.0000 mg | Freq: Once | INTRAMUSCULAR | Status: DC | PRN

## 2014-11-03 MED ORDER — BACITRACIN ZINC 500 UNIT/GM EX OINT
TOPICAL_OINTMENT | CUTANEOUS | Status: DC | PRN
  Administered 2014-11-03: 1 via TOPICAL

## 2014-11-03 MED ORDER — PROPOFOL 10 MG/ML IV BOLUS
INTRAVENOUS | Status: DC | PRN
  Administered 2014-11-03: 150 mg via INTRAVENOUS

## 2014-11-03 MED ORDER — SODIUM CHLORIDE 0.9 % IV SOLN: INTRAVENOUS | Status: DC

## 2014-11-03 MED ORDER — CHLORHEXIDINE GLUCONATE 4 % EX LIQD: 60.0000 mL | Freq: Once | CUTANEOUS | Status: DC

## 2014-11-03 MED ORDER — BUPIVACAINE-EPINEPHRINE (PF) 0.5% -1:200000 IJ SOLN
INTRAMUSCULAR | Status: AC
  Filled 2014-11-03: qty 30

## 2014-11-03 MED ORDER — EPHEDRINE SULFATE 50 MG/ML IJ SOLN
INTRAMUSCULAR | Status: DC | PRN
  Administered 2014-11-03 (×2): 10 mg via INTRAVENOUS

## 2014-11-03 MED ORDER — FENTANYL CITRATE 0.05 MG/ML IJ SOLN
INTRAMUSCULAR | Status: AC
  Filled 2014-11-03: qty 6

## 2014-11-03 MED ORDER — BACITRACIN ZINC 500 UNIT/GM EX OINT
TOPICAL_OINTMENT | CUTANEOUS | Status: AC
  Filled 2014-11-03: qty 28.35

## 2014-11-03 MED ORDER — ONDANSETRON HCL 4 MG/2ML IJ SOLN
INTRAMUSCULAR | Status: DC | PRN
Start: 2014-11-03 — End: 2014-11-03
  Administered 2014-11-03: 4 mg via INTRAVENOUS

## 2014-11-03 MED ORDER — CEFAZOLIN SODIUM-DEXTROSE 2-3 GM-% IV SOLR
INTRAVENOUS | Status: AC
  Filled 2014-11-03: qty 50

## 2014-11-03 MED ORDER — LIDOCAINE HCL (PF) 1 % IJ SOLN
INTRAMUSCULAR | Status: AC
  Filled 2014-11-03: qty 30

## 2014-11-03 MED ORDER — BUPIVACAINE-EPINEPHRINE 0.5% -1:200000 IJ SOLN
INTRAMUSCULAR | Status: DC | PRN
  Administered 2014-11-03: 10 mL

## 2014-11-03 MED ORDER — 0.9 % SODIUM CHLORIDE (POUR BTL) OPTIME
TOPICAL | Status: DC | PRN
  Administered 2014-11-03: 200 mL

## 2014-11-03 MED ORDER — FENTANYL CITRATE 0.05 MG/ML IJ SOLN: 50.0000 ug | INTRAMUSCULAR | Status: DC | PRN

## 2014-11-03 MED ORDER — MIDAZOLAM HCL 2 MG/2ML IJ SOLN: 1.0000 mg | INTRAMUSCULAR | Status: DC | PRN

## 2014-11-03 MED ORDER — CEFAZOLIN SODIUM-DEXTROSE 2-3 GM-% IV SOLR
2.0000 g | INTRAVENOUS | Status: AC
  Administered 2014-11-03: 2 g via INTRAVENOUS

## 2014-11-03 MED ORDER — ACETAMINOPHEN 500 MG PO TABS
1000.0000 mg | ORAL_TABLET | Freq: Once | ORAL | Status: AC
  Administered 2014-11-03: 1000 mg via ORAL

## 2014-11-03 MED ORDER — DEXAMETHASONE SODIUM PHOSPHATE 10 MG/ML IJ SOLN
INTRAMUSCULAR | Status: DC | PRN
  Administered 2014-11-03: 5 mg via INTRAVENOUS

## 2014-11-03 MED ORDER — FENTANYL CITRATE 0.05 MG/ML IJ SOLN: 25.0000 ug | INTRAMUSCULAR | Status: DC | PRN

## 2014-11-03 MED ORDER — FENTANYL CITRATE 0.05 MG/ML IJ SOLN
INTRAMUSCULAR | Status: DC | PRN
  Administered 2014-11-03: 50 ug via INTRAVENOUS

## 2014-11-03 MED ORDER — LACTATED RINGERS IV SOLN
INTRAVENOUS | Status: DC
  Administered 2014-11-03: 08:00:00 via INTRAVENOUS

## 2014-11-03 SURGICAL SUPPLY — 73 items
BAG DECANTER FOR FLEXI CONT (MISCELLANEOUS) IMPLANT
BANDAGE ELASTIC 4 VELCRO ST LF (GAUZE/BANDAGES/DRESSINGS) IMPLANT
BANDAGE ESMARK 6X9 LF (GAUZE/BANDAGES/DRESSINGS) IMPLANT
BLADE SURG 15 STRL LF DISP TIS (BLADE) ×2 IMPLANT
BLADE SURG 15 STRL SS (BLADE) ×6
BNDG CMPR 9X4 STRL LF SNTH (GAUZE/BANDAGES/DRESSINGS)
BNDG CMPR 9X6 STRL LF SNTH (GAUZE/BANDAGES/DRESSINGS) ×1
BNDG COHESIVE 4X5 TAN STRL (GAUZE/BANDAGES/DRESSINGS) ×2 IMPLANT
BNDG COHESIVE 6X5 TAN STRL LF (GAUZE/BANDAGES/DRESSINGS) IMPLANT
BNDG ESMARK 4X9 LF (GAUZE/BANDAGES/DRESSINGS) IMPLANT
BNDG ESMARK 6X9 LF (GAUZE/BANDAGES/DRESSINGS) ×3
CHLORAPREP W/TINT 26ML (MISCELLANEOUS) ×3 IMPLANT
CLOSURE WOUND 1/2 X4 (GAUZE/BANDAGES/DRESSINGS)
COVER BACK TABLE 60X90IN (DRAPES) ×3 IMPLANT
CUFF TOURNIQUET SINGLE 34IN LL (TOURNIQUET CUFF) IMPLANT
DECANTER SPIKE VIAL GLASS SM (MISCELLANEOUS) IMPLANT
DRAPE EXTREMITY T 121X128X90 (DRAPE) ×3 IMPLANT
DRAPE OEC MINIVIEW 54X84 (DRAPES) ×2 IMPLANT
DRAPE SURG 17X23 STRL (DRAPES) IMPLANT
DRAPE U-SHAPE 47X51 STRL (DRAPES) ×3 IMPLANT
DRSG EMULSION OIL 3X3 NADH (GAUZE/BANDAGES/DRESSINGS) ×3 IMPLANT
DRSG PAD ABDOMINAL 8X10 ST (GAUZE/BANDAGES/DRESSINGS) ×2 IMPLANT
DRSG TEGADERM 4X4.75 (GAUZE/BANDAGES/DRESSINGS) IMPLANT
ELECT REM PT RETURN 9FT ADLT (ELECTROSURGICAL) ×3
ELECTRODE REM PT RTRN 9FT ADLT (ELECTROSURGICAL) ×1 IMPLANT
GAUZE SPONGE 4X4 12PLY STRL (GAUZE/BANDAGES/DRESSINGS) ×3 IMPLANT
GLOVE BIO SURGEON STRL SZ7 (GLOVE) ×1 IMPLANT
GLOVE BIO SURGEON STRL SZ8 (GLOVE) ×3 IMPLANT
GLOVE BIOGEL PI IND STRL 7.0 (GLOVE) ×1 IMPLANT
GLOVE BIOGEL PI IND STRL 8 (GLOVE) ×1 IMPLANT
GLOVE BIOGEL PI INDICATOR 7.0 (GLOVE) ×2
GLOVE BIOGEL PI INDICATOR 8 (GLOVE) ×2
GLOVE ECLIPSE 6.5 STRL STRAW (GLOVE) ×2 IMPLANT
GLOVE EXAM NITRILE MD LF STRL (GLOVE) ×2 IMPLANT
GOWN STRL REUS W/ TWL LRG LVL3 (GOWN DISPOSABLE) ×2 IMPLANT
GOWN STRL REUS W/ TWL XL LVL3 (GOWN DISPOSABLE) ×1 IMPLANT
GOWN STRL REUS W/TWL LRG LVL3 (GOWN DISPOSABLE) ×3
GOWN STRL REUS W/TWL XL LVL3 (GOWN DISPOSABLE) ×3
NDL HYPO 25X1 1.5 SAFETY (NEEDLE) IMPLANT
NEEDLE HYPO 22GX1.5 SAFETY (NEEDLE) IMPLANT
NEEDLE HYPO 25X1 1.5 SAFETY (NEEDLE) ×3 IMPLANT
PACK BASIN DAY SURGERY FS (CUSTOM PROCEDURE TRAY) ×3 IMPLANT
PAD CAST 4YDX4 CTTN HI CHSV (CAST SUPPLIES) ×1 IMPLANT
PADDING CAST ABS 4INX4YD NS (CAST SUPPLIES)
PADDING CAST ABS COTTON 4X4 ST (CAST SUPPLIES) IMPLANT
PADDING CAST COTTON 4X4 STRL (CAST SUPPLIES) ×3
PADDING CAST COTTON 6X4 STRL (CAST SUPPLIES) IMPLANT
PENCIL BUTTON HOLSTER BLD 10FT (ELECTRODE) ×2 IMPLANT
PIN GUIDE DRILL TIP 2.8X300 (DRILL) ×2 IMPLANT
SANITIZER HAND PURELL 535ML FO (MISCELLANEOUS) ×3 IMPLANT
SHEET MEDIUM DRAPE 40X70 STRL (DRAPES) ×3 IMPLANT
SLEEVE SCD COMPRESS KNEE MED (MISCELLANEOUS) ×3 IMPLANT
SPLINT FAST PLASTER 5X30 (CAST SUPPLIES)
SPLINT PLASTER CAST FAST 5X30 (CAST SUPPLIES) IMPLANT
SPONGE LAP 18X18 X RAY DECT (DISPOSABLE) ×3 IMPLANT
STAPLER VISISTAT 35W (STAPLE) IMPLANT
STOCKINETTE 6  STRL (DRAPES) ×2
STOCKINETTE 6 STRL (DRAPES) ×1 IMPLANT
STRIP CLOSURE SKIN 1/2X4 (GAUZE/BANDAGES/DRESSINGS) IMPLANT
SUCTION FRAZIER TIP 10 FR DISP (SUCTIONS) IMPLANT
SUT ETHILON 3 0 PS 1 (SUTURE) ×2 IMPLANT
SUT MNCRL AB 3-0 PS2 18 (SUTURE) ×2 IMPLANT
SUT VIC AB 0 SH 27 (SUTURE) IMPLANT
SUT VIC AB 2-0 SH 27 (SUTURE)
SUT VIC AB 2-0 SH 27XBRD (SUTURE) IMPLANT
SUT VICRYL 4-0 PS2 18IN ABS (SUTURE) IMPLANT
SYR BULB 3OZ (MISCELLANEOUS) ×3 IMPLANT
SYR CONTROL 10ML LL (SYRINGE) ×2 IMPLANT
TOWEL OR 17X24 6PK STRL BLUE (TOWEL DISPOSABLE) ×5 IMPLANT
TOWEL OR NON WOVEN STRL DISP B (DISPOSABLE) IMPLANT
TUBE CONNECTING 20'X1/4 (TUBING)
TUBE CONNECTING 20X1/4 (TUBING) IMPLANT
UNDERPAD 30X30 INCONTINENT (UNDERPADS AND DIAPERS) ×3 IMPLANT

## 2014-11-03 NOTE — Brief Op Note (Signed)
11/03/2014  9:11 AM  PATIENT:  [name]  78 y.o. female  PRE-OPERATIVE DIAGNOSIS:  PAINFUL HARDWARE right foot talus and calcaneus  POST-OPERATIVE DIAGNOSIS:  Same  Procedure(s):  1.  Removal of deep implants from posterior right heel   2.  Removal of deep implants from lateral right foot (separate incision)   3.  Lateral and Harris Heel xrays of right foot    SURGEON:  Wylene Simmer, MD  ASSISTANT: n/a  ANESTHESIA:   General, regional  EBL:  minimal   TOURNIQUET:  n/a  COMPLICATIONS:  None apparent  DISPOSITION:  Extubated, awake and stable to recovery.  DICTATION ID:  [id number]

## 2014-11-03 NOTE — Anesthesia Procedure Notes (Signed)
Procedure Name: LMA Insertion Date/Time: 11/03/2014 8:33 AM Performed by: Amontae Ng Pre-anesthesia Checklist: Patient identified, Emergency Drugs available, Suction available and Patient being monitored Patient Re-evaluated:Patient Re-evaluated prior to inductionOxygen Delivery Method: Circle System Utilized Preoxygenation: Pre-oxygenation with 100% oxygen Intubation Type: IV induction Ventilation: Mask ventilation without difficulty LMA: LMA inserted LMA Size: 4.0 Number of attempts: 1 Airway Equipment and Method: bite block Placement Confirmation: positive ETCO2 Tube secured with: Tape Dental Injury: Teeth and Oropharynx as per pre-operative assessment

## 2014-11-03 NOTE — Anesthesia Preprocedure Evaluation (Signed)
Anesthesia Evaluation  Patient identified by MRN, date of birth, ID band Patient awake    Reviewed: Allergy & Precautions, H&P , NPO status , Patient's Chart, lab work & pertinent test results  History of Anesthesia Complications (+) PONV  Airway Mallampati: I  TM Distance: >3 FB Neck ROM: Full    Dental  (+) Teeth Intact, Partial Upper, Dental Advisory Given   Pulmonary former smoker,  breath sounds clear to auscultation        Cardiovascular hypertension, Pt. on medications + CAD Rhythm:Regular Rate:Normal     Neuro/Psych    GI/Hepatic GERD-  Medicated and Controlled,  Endo/Other    Renal/GU      Musculoskeletal   Abdominal   Peds  Hematology   Anesthesia Other Findings   Reproductive/Obstetrics                             Anesthesia Physical Anesthesia Plan  ASA: III  Anesthesia Plan: General   Post-op Pain Management:    Induction: Intravenous  Airway Management Planned: LMA  Additional Equipment:   Intra-op Plan:   Post-operative Plan: Extubation in OR  Informed Consent: I have reviewed the patients History and Physical, chart, labs and discussed the procedure including the risks, benefits and alternatives for the proposed anesthesia with the patient or authorized representative who has indicated his/her understanding and acceptance.   Dental advisory given  Plan Discussed with: CRNA, Anesthesiologist and Surgeon  Anesthesia Plan Comments:         Anesthesia Quick Evaluation

## 2014-11-03 NOTE — Anesthesia Postprocedure Evaluation (Signed)
  Anesthesia Post-op Note  Patient: [name]  Procedure(s) Performed: Procedure(s): HARDWARE REMOVAL OF SUBTALAR JOINT (Right)  Patient Location: PACU  Anesthesia Type: General   Level of Consciousness: awake, alert  and oriented  Airway and Oxygen Therapy: Patient Spontanous Breathing  Post-op Pain: mild  Post-op Assessment: Post-op Vital signs reviewed  Post-op Vital Signs: Reviewed  Last Vitals:  Filed Vitals:   11/03/14 1000  BP: 126/53  Pulse: 57  Temp:   Resp: 15    Complications: No apparent anesthesia complications

## 2014-11-03 NOTE — Discharge Instructions (Signed)
[name] Simmer, MD [place]  Please read the following information regarding your care after surgery.  Medications  You only need a prescription for the narcotic pain medicine (ex. oxycodone, Percocet, Norco).  All of the other medicines listed below are available over the counter. X acetominophen (Tylenol) 650 mg every 4-6 hours as you need for minor pain X tramadol as prescribed for moderate to severe pain  X To help prevent blood clots, resume taking your Xarelto tomorrow.  You should also get up every hour while you are awake to move around.    Weight Bearing X Bear weight when you are able on your operated leg or foot.  Cast / Splint / Dressing X Remove your dressing 3 days after surgery and cover the incisions with dry dressings.    After your dressing, cast or splint is removed; you may shower, but do not soak or scrub the wound.  Allow the water to run over it, and then gently pat it dry.  Swelling It is normal for you to have swelling where you had surgery.  To reduce swelling and pain, keep your toes above your nose for at least 3 days after surgery.  It may be necessary to keep your foot or leg elevated for several weeks.  If it hurts, it should be elevated.  Follow Up Call my office at [phone] when you are discharged from the hospital or surgery center to schedule an appointment to be seen two weeks after surgery.  Call my office at [phone] if you develop a fever >101.5 F, nausea, vomiting, bleeding from the surgical site or severe pain.     Post Anesthesia Home Care Instructions  Activity: Get plenty of rest for the remainder of the day. A responsible adult should stay with you for 24 hours following the procedure.  For the next 24 hours, DO NOT: -Drive a car -Paediatric nurse -Drink alcoholic beverages -Take any medication unless instructed by your physician -Make any legal decisions or sign important papers.  Meals: Start with liquid  foods such as gelatin or soup. Progress to regular foods as tolerated. Avoid greasy, spicy, heavy foods. If nausea and/or vomiting occur, drink only clear liquids until the nausea and/or vomiting subsides. Call your physician if vomiting continues.  Special Instructions/Symptoms: Your throat may feel dry or sore from the anesthesia or the breathing tube placed in your throat during surgery. If this causes discomfort, gargle with warm salt water. The discomfort should disappear within 24 hours.

## 2014-11-03 NOTE — Transfer of Care (Signed)
Immediate Anesthesia Transfer of Care Note  Patient: [name]  Procedure(s) Performed: Procedure(s): HARDWARE REMOVAL OF SUBTALAR JOINT (Right)  Patient Location: PACU  Anesthesia Type:General  Level of Consciousness: awake, alert , oriented and patient cooperative  Airway & Oxygen Therapy: Patient Spontanous Breathing and Patient connected to face mask oxygen  Post-op Assessment: Report given to PACU RN and Post -op Vital signs reviewed and stable  Post vital signs: Reviewed and stable  Complications: No apparent anesthesia complications

## 2014-11-03 NOTE — Op Note (Signed)
NAMEANBERLIN, DIEZ NO.:  [id number]  MEDICAL RECORD NO.:  [id number]  LOCATION:                                 FACILITY:  PHYSICIAN:  Wylene Simmer, MD        DATE OF BIRTH:  1936-02-20  DATE OF PROCEDURE:  11/03/2014 DATE OF DISCHARGE:                              OPERATIVE REPORT   PREOPERATIVE DIAGNOSIS:  Painful hardware, right foot talus and calcaneus.  POSTOPERATIVE DIAGNOSIS:  Painful hardware, right foot talus and calcaneus.  PROCEDURE: 1. Removal of deep implants from the posterior right heel. 2. Removal of deep implants from the lateral right foot through a     separate incision. 3. Lateral and Harris heel radiographs of the right foot.  SURGEON:  Wylene Simmer, MD  ANESTHESIA:  General, regional.  ESTIMATED BLOOD LOSS:  Minimal.  TOURNIQUET TIME:  Zero.  COMPLICATIONS:  None apparent.  DISPOSITION:  Extubated, awake, and stable to recovery.  INDICATIONS FOR PROCEDURE:  The patient is a 78 year old woman who is status post revision of subtalar arthrodesis.  She has developed pain at the posterior aspect of the heel where the screws are in place as well as along the lateral border of the foot where another screw head is in place.  She desires removal of the screws and an attempt to treat this pain.  She understands the risks and benefits, the alternative treatment options, and elects surgical treatment.  She specifically understands the risks of bleeding, infection, nerve damage, blood clots, need for additional surgery, continued pain, amputation, and death.  PROCEDURE IN DETAIL:  After preoperative consent was obtained and the correct operative site was identified, the patient was brought to the operating room and placed supine on the operating table.  General anesthesia was induced.  Preoperative antibiotics were administered. Surgical time-out was taken.  The right lower extremity was then prepped and draped in standard sterile  fashion.  The patient's previous posterior heel incisions were identified.  The medial and lateral incisions were again opened sharply with a scalpel.  Sharp dissection was carried down through the skin and subcutaneous tissue to the head of the screw.  The screw head was identified and cleaned of all soft tissue.  The guide pin was inserted into the screw head and the screwdriver was utilized to remove the screw in its entirety.  The more lateral screw was then identified and again cleaned of all soft tissue. The guide pin was inserted and then the screwdriver was used to remove the screw in its entirety.  The washer was then grasped with a hemostat. The washer was removed in its entirety as well.  Attention was then turned to the lateral aspect of the foot where the previous incision was identified.  The incision was opened again and sharp dissection was carried down through the subcutaneous tissue to the screw.  The screw head was identified and cleaned of all soft tissue.  A guide pin was inserted and the screwdriver was used to remove the screw in its entirety.  All 3 wounds were then irrigated copiously. Horizontal mattress sutures of 3-0 nylon were used to close skin incisions.  Sterile dressings were applied and followed by compression wrap.  The patient was awakened from anesthesia and transported to the recovery room in stable condition.  Prior to applying the dressings, 0.5% Marcaine with epinephrine was infiltrated into the subcutaneous tissue around the 3 incision sites.  FOLLOWUP PLAN:  The patient will be weightbearing as tolerated on the right foot.  She will follow up with me in 2 weeks for suture removal.  RADIOGRAPHS:  Lateral and Harris heel radiographs of the right foot were obtained intraoperatively today.  These show interval removal of all of the remaining hardware in the talus and calcaneus.  No acute injuries noted.     Wylene Simmer, MD     JH/MEDQ   D:  11/03/2014  T:  11/03/2014  Job:  [id number]

## 2014-11-03 NOTE — H&P (Signed)
[name] is an 78 y.o. female.   Chief Complaint: right foot pain HPI: 78 y/o female with painful deep implants in the right foot s/p revision subtalar arthrodesis.  She presents now for removal of the deep implants.  Past Medical History  Diagnosis Date  . GERD (gastroesophageal reflux disease)   . Anxiety disorder   . Arthritis   . CAD (coronary artery disease)     Cath May 2010.  Nonbstructive  . Depression   . Hypothyroid   . Asthmatic bronchitis   . Hypertension     dr Percival Spanish  . OA (osteoarthritis)   . Chronic bronchitis   . 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 Encephalitis     d/t meningitis  . PONV (postoperative nausea and vomiting)     history of cardiac arrest day 1 post surgery  in 2008  . Esophageal motility disorder   . Atrial fibrillation   . Hyperlipidemia   . Fibromyalgia   . Vitamin D deficiency   . Status post dilation of esophageal narrowing   . Bowel obstruction     blockage  . Anxiety     Past Surgical History  Procedure Laterality Date  . Total knee arthroplasty Right   . Back surgery    . Coronary angioplasty with stent placement  2003  . Sinus surgery with instatrak    . Knee arthroscopy  03/26/2012    Procedure: ARTHROSCOPY KNEE;  Surgeon: Wylene Simmer, MD;  Location: [facility];  Service: Orthopedics;  Laterality: Left;  with Debridement of Lateral Meniscus tear  . Rotator cuff repair Bilateral   . Ankle fusion  08/27/2012    Procedure: ARTHRODESIS ANKLE;  Surgeon: Wylene Simmer, MD;  Location: [facility];  Service: Orthopedics;  Laterality: Right;  Arthrodesis right ankle and subtalar joint  . Foot arthrodesis, subtalar Right 2013  . Removal of implant Right 03/31/2014    DR Honestii Marton  . Hardware revision  03/31/2014    SUBTALOR  ARTHRODESIS       DR Subrina Vecchiarelli  . Hardware removal Right 03/31/2014    Procedure: REMOVAL OF DEEP IMPLANTS X 3  RIGHT ;  Surgeon: Wylene Simmer, MD;  Location: [facility];  Service: Orthopedics;   Laterality: Right;  . Arthrodesis tibiofibular Right 03/31/2014    Procedure: REVISION OF SUBTALOR ARTHRODESIS  RIGHT ;  Surgeon: Wylene Simmer, MD;  Location: [facility];  Service: Orthopedics;  Laterality: Right;  . Video bronchoscopy N/A 05/03/2014    Procedure: VIDEO BRONCHOSCOPY;  Surgeon: [name], MD;  Location: [facility];  Service: Thoracic;  Laterality: N/A;  . Video assisted thoracoscopy (vats)/empyema Right 05/03/2014    Procedure: VIDEO ASSISTED THORACOSCOPY (VATS)/EMPYEMA;  Surgeon: [name], MD;  Location: [facility];  Service: Thoracic;  Laterality: Right;  . Decortication Right 05/03/2014    Procedure: DECORTICATION;  Surgeon: [name], MD;  Location: [facility];  Service: Thoracic;  Laterality: Right;    Family History  Problem Relation Age of Onset  . Prostate cancer Brother   . Cancer Brother     PROSTATE  . Heart disease Mother   . Asthma Mother   . Congestive Heart Failure Mother   . Emphysema Sister   . COPD Sister   . Stroke Sister   . Heart disease Sister    Social History:  reports that she quit smoking about 24 years ago. Her smoking use included Cigarettes. She has a 15 pack-year smoking  history. She has never used smokeless tobacco. She reports that she does not drink alcohol or use illicit drugs.  Allergies:  Allergies  Allergen Reactions  . Aspirin Other (See Comments)    REACTION: regular strength causes "heart to beat fast"  . Captopril Hypertension  . Naproxen Other (See Comments)    Tongue swelling  . Penicillins     Swelling around site  . Sulfonamide Derivatives Hives  . Clindamycin/Lincomycin     Medications Prior to Admission  Medication Sig Dispense Refill  . acetaminophen (TYLENOL) 325 MG tablet Take 2 tablets (650 mg total) by mouth every 6 (six) hours as needed for mild pain or moderate pain.    Marland Kitchen albuterol (PROVENTIL HFA;VENTOLIN HFA) 108 (90 BASE) MCG/ACT inhaler Inhale into the lungs every 6 (six) hours as needed for wheezing  or shortness of breath.    . ALPRAZolam (XANAX) 0.5 MG tablet TAKE 2 TABLETS AT BEDTIME 60 tablet 1  . Ascorbic Acid (VITAMIN C PO) Take 1 tablet by mouth every morning.    . budesonide (PULMICORT) 0.5 MG/2ML nebulizer solution Take 2 mLs (0.5 mg total) by nebulization 2 (two) times daily. Dx 496 120 mL 6  . Calcium Carbonate-Vitamin D (CALCIUM 600+D) 600-400 MG-UNIT per tablet Take 1 tablet by mouth daily at 6 PM.     . cholecalciferol (VITAMIN D) 1000 UNITS tablet Take 1,000 Units by mouth 2 (two) times daily with breakfast and lunch.     . CRESTOR 20 MG tablet TAKE 1 TABLET ONCE A DAY 30 tablet 5  . dextromethorphan-guaiFENesin (MUCINEX DM) 30-600 MG per 12 hr tablet Take 1 tablet by mouth every 12 (twelve) hours. scheduled    . diltiazem (CARDIZEM CD) 120 MG 24 hr capsule Take 1 capsule (120 mg total) by mouth daily. 30 capsule 11  . fluticasone (FLONASE) 50 MCG/ACT nasal spray 2 SPRAYS IN EACH NOSTRIL ONCE A DAY 16 g 5  . levothyroxine (SYNTHROID, LEVOTHROID) 25 MCG tablet Take 50 mcg by mouth daily before breakfast.    . loratadine (CLARITIN) 10 MG tablet Take 10 mg by mouth daily.     . metoprolol (LOPRESSOR) 50 MG tablet Take 50 mg by mouth 2 (two) times daily.    . montelukast (SINGULAIR) 10 MG tablet Take 10 mg by mouth at bedtime.    . montelukast (SINGULAIR) 10 MG tablet TAKE 1 TABLET ONCE A DAY 30 tablet 4  . Multiple Vitamin (MULTIVITAMIN WITH MINERALS) TABS Take 1 tablet by mouth daily.    . nitroGLYCERIN (NITROSTAT) 0.4 MG SL tablet Place 0.4 mg under the tongue every 5 (five) minutes as needed. For chest pain    . omega-3 acid ethyl esters (LOVAZA) 1 G capsule Take 2 g by mouth 2 (two) times daily.    Marland Kitchen omeprazole-sodium bicarbonate (ZEGERID) 40-1100 MG per capsule TAKE (1) CAPSULE DAILY 30 capsule 3  . ondansetron (ZOFRAN) 4 MG tablet TAKE 1 TABLET EVERY 12 HOURS AS NEEDED FOR NAUSEA 20 tablet 0  . rivaroxaban (XARELTO) 20 MG TABS tablet Take 1 tablet (20 mg total) by mouth  daily with supper. 30 tablet 0  . rosuvastatin (CRESTOR) 20 MG tablet Take 20 mg by mouth at bedtime.    . sertraline (ZOLOFT) 100 MG tablet TAKE 1 TABLET ONCE A DAY 30 tablet 2  . XARELTO 20 MG TABS tablet TAKE 1 TABLET ONCE A DAY 30 tablet 1  . denosumab (PROLIA) 60 MG/ML SOLN injection Inject 60 mg into the skin every 6 (six)  months. Administer in upper arm, thigh, or abdomen 1 mL 0    No results found for this or any previous visit (from the past 48 hour(s)). No results found.  ROS  No recent f/c/n/v/wt loss  Blood pressure 142/64, pulse 56, temperature 97.4 F (36.3 C), temperature source Oral, resp. rate 20, height 4\' 11"  (1.499 m), weight 68.947 kg (152 lb), SpO2 98 %. Physical Exam  wn wd elderly woman in nad.  A and O x 4.  Mood and affect normal.  EOMI.  Resp unlabored.  R foot with healed surgical incisions.  No lymphadenopathy.  Pulses are palpable.  sens to LT intact dorsally and plantarly.  Assessment/Plan R foot painful hardware - to OR for removal of deep implants.  The risks and benefits of the alternative treatment options have been discussed in detail.  The patient wishes to proceed with surgery and specifically understands risks of bleeding, infection, nerve damage, blood clots, need for additional surgery, amputation and death.   HEWITTJenny Reichmann 08-Nov-2014, 8:10 AM

## 2014-11-04 ENCOUNTER — Encounter (HOSPITAL_BASED_OUTPATIENT_CLINIC_OR_DEPARTMENT_OTHER): Payer: Self-pay | Admitting: Orthopedic Surgery

## 2014-11-04 ENCOUNTER — Telehealth: Payer: Self-pay

## 2014-11-04 DIAGNOSIS — R195 Other fecal abnormalities: Secondary | ICD-10-CM

## 2014-11-04 NOTE — Telephone Encounter (Signed)
-----   Message from Chipper Herb, MD sent at 11/03/2014  2:19 PM EST ----- This patient has had 2 FOBT's that were positive. She should be scheduled for a visit with the gastroenterologist for further evaluation and workup

## 2014-11-04 NOTE — Telephone Encounter (Signed)
Pt aware of results and has seen Dr. Henrene Pastor in the past; Referral to be submitted

## 2014-11-04 NOTE — Telephone Encounter (Signed)
Noted  

## 2014-11-05 NOTE — Addendum Note (Signed)
Addended by: Zannie Cove on: 11/05/2014 10:59 AM   Modules accepted: Orders

## 2014-11-07 ENCOUNTER — Telehealth: Payer: Self-pay | Admitting: Internal Medicine

## 2014-11-07 NOTE — Telephone Encounter (Signed)
Offered pt an appt for 11/08/04 but pt states she cannot come due to recent foot surgery.

## 2014-11-07 NOTE — Telephone Encounter (Signed)
Pt scheduled to see Dr. Henrene Pastor 12/30/14@2 :15pm. Pt states she did stool cards twice for her PCP and they were positive. Pt aware of appt.

## 2014-11-08 NOTE — Telephone Encounter (Signed)
Closed encounter °

## 2014-11-14 ENCOUNTER — Ambulatory Visit (INDEPENDENT_AMBULATORY_CARE_PROVIDER_SITE_OTHER): Payer: Medicare Other | Admitting: Adult Health

## 2014-11-14 ENCOUNTER — Encounter: Payer: Self-pay | Admitting: Adult Health

## 2014-11-14 VITALS — BP 118/76 | HR 66 | Temp 96.9°F | Ht 59.0 in | Wt 152.4 lb

## 2014-11-14 DIAGNOSIS — J4520 Mild intermittent asthma, uncomplicated: Secondary | ICD-10-CM

## 2014-11-14 NOTE — Assessment & Plan Note (Signed)
Controlled on current regimen  Continue to control for triggers of GERD and AR  Plan  Use Albuterol Neb every 6hrs as needed  Continue on Budesonide Neb Twice daily  , rinse after use.  Please contact office for sooner follow up if symptoms do not improve or worsen or seek emergency care  Follow up Dr. Elsworth Soho  In 4 months and As needed

## 2014-11-14 NOTE — Progress Notes (Signed)
   Subjective:    Patient ID: [name], female    DOB: 11/08/1936, 78 y.o.   MRN: [id number]  HPI  78/F remote ex smoker with reactive airway disease - ? GERD vs sinusitis, nml pFTs  She smoked a PPD x 20-25 yrs before quitting in 1990. She develops recurrent chest colds requiring several rounds of Abx to get better.  Data:  SHe underwent esophageal dilation for hypertensive LES (Dr Olevia Perches) & is on protonix two times a day . Evaluation for chest pain in 6/10 (Dr Percival Spanish) -no obstructive CAD - attributed to esophageal spasm.Sucralfate added by GI to zegerid .  Took macrodantin x 2 yrs for UTIs , stopped '10  Prednisone makes her hyper and nervous. Has osteoporosis with fractures.  PFTs nml '11 .  RAST - IgE 225 -high but no sensitivity to common indoor/ outdoor allergens  Underwent endoscopic sinus surgery 1/12 by dr Constance Holster  Treated x 6 wks with clinda for chronic sinusitis, started wheezing again when ABx stopped   06/24/2013 Spirometry -no obstruction    07/12/14  She had a prolonged hospitalization with rehabilitation stay. Patient was admitted May 15 for acute hypoxic respiratory failure, secondary to pneumonia, asthma, exacerbation, congestive heart failure, and MRSA empyema requiring vats on May 19, complicated by an element of vocal cord dysfunction, and chronic cough.  Patient had a slow to resolve right lower lobe pneumonia with a loculated pleural effusion and worsening respiratory failure despite aggressive antibiotics.  Cultures returned back for positive MRSA. Patient did have upper airway wheezing, consistent with vocal cord dysfunction. She was seen by ENT, who did a laryngoscopy at the bedside. That was consistent with diffuse inflammation of the vocal cords, and supraglottic larynx. Her hospital stay. Was, complicated by atrial fib with RVR. Reports breathing is doing well ,she does mention some fatigue/weakness. Cough is much better.  Is now home from rehab.  Has  restarted  her Pulmiocrt neb .  Prednisone was tapered to off CXR showed improved aeration w/ improving infiltrate /effusion on right   She's not using her oxygen and would like this to be discontinued. She does desat to 87% on walking one lap around the office, but recovered spontaneously on resting  11/14/2014 Follow up RAD w/ GERD/AR triggers Returns for 4 month follow up.  Reports breathing is doing well since last ov.   Does mention some recent cough and congestion but symptoms have improved.  Denies chest pain, shortness of breath , fever, orthopnea, PND or leg swelling    Review of Systems neg for any significant sore throat, dysphagia, itching, sneezing, nasal congestion or excess/ purulent secretions, fever, chills, sweats, unintended wt loss, pleuritic or exertional cp, hempoptysis, orthopnea pnd or change in chronic leg swelling. Also denies presyncope, palpitations, heartburn, abdominal pain, nausea, vomiting, diarrhea or change in bowel or urinary habits, dysuria,hematuria, rash, arthralgias, visual complaints, headache, numbness weakness or ataxia.     Objective:   Physical Exam  Gen. Pleasant, well-nourished, in no distress ENT - no lesions, no post nasal drip Neck: No JVD, no thyromegaly, no carotid bruits Lungs: no use of accessory muscles, no dullness to percussion, clear without rales or rhonchi  Cardiovascular: Rhythm regular, heart sounds  normal, no murmurs or gallops, no peripheral edema Musculoskeletal: No deformities, no cyanosis or clubbing        Assessment & Plan:

## 2014-11-14 NOTE — Patient Instructions (Signed)
Use Albuterol Neb every 6hrs as needed  Continue on Budesonide Neb Twice daily  , rinse after use.  Please contact office for sooner follow up if symptoms do not improve or worsen or seek emergency care  Follow up Dr. Elsworth Soho  In 4 months and As needed

## 2014-11-16 NOTE — Progress Notes (Signed)
Reviewed & agree with plan  

## 2014-11-21 ENCOUNTER — Telehealth: Payer: Self-pay | Admitting: Family Medicine

## 2014-11-21 ENCOUNTER — Other Ambulatory Visit: Payer: Self-pay | Admitting: Family Medicine

## 2014-11-21 NOTE — Telephone Encounter (Signed)
Pt given appt with Dietrich Pates 12/9 @ 10:45.

## 2014-11-23 ENCOUNTER — Ambulatory Visit: Payer: PRIVATE HEALTH INSURANCE | Admitting: Family Medicine

## 2014-11-25 ENCOUNTER — Ambulatory Visit (INDEPENDENT_AMBULATORY_CARE_PROVIDER_SITE_OTHER): Payer: Medicare Other | Admitting: Cardiology

## 2014-11-25 ENCOUNTER — Encounter: Payer: Self-pay | Admitting: Cardiology

## 2014-11-25 VITALS — BP 140/82 | HR 63 | Ht 60.0 in | Wt 152.0 lb

## 2014-11-25 DIAGNOSIS — R0602 Shortness of breath: Secondary | ICD-10-CM

## 2014-11-25 NOTE — Progress Notes (Signed)
HPI The patient presents for followup of known coronary disease and atrial fib.  Since I last saw her she has had a very complicated year.  She had ankle surgery earlier in the year. She was [place] and developed apparently an aspiration pneumonia. She was hospitalized in May for this and I have extensively reviewed his records and cardiology was not consult. She had pneumonia with pleural effusion requiring chest tube and then fax procedure. She had MRSA. She did have transient atrial fibrillation. She was told she had heart failure but I don't see an echocardiogram. I do see that her BNP was slightly elevated. She's had a difficult recovery from this. She's been fatigued. However, she's not describing any chest pressure, neck or arm discomfort. She's not noticing any palpitations She rarely has some isolated palpitations. Unfortunately she is having some problems with possible bronchitis. She actually is going to see an ENT physician soon.  She also might have to have redo ankle surgery.  The patient denies any new symptoms such as chest discomfort, neck or arm discomfort. There has been no new shortness of breath, PND or orthopnea. There has been no  presyncope or syncope.   Allergies  Allergen Reactions  . Aspirin Other (See Comments)    REACTION: regular strength causes "heart to beat fast"  . Captopril Hypertension  . Naproxen Other (See Comments)    Tongue swelling  . Penicillins     Swelling around site  . Sulfonamide Derivatives Hives  . Clindamycin/Lincomycin     Current Outpatient Prescriptions  Medication Sig Dispense Refill  . acetaminophen (TYLENOL) 325 MG tablet Take 2 tablets (650 mg total) by mouth every 6 (six) hours as needed for mild pain or moderate pain.    Marland Kitchen albuterol (PROVENTIL HFA;VENTOLIN HFA) 108 (90 BASE) MCG/ACT inhaler Inhale into the lungs every 6 (six) hours as needed for wheezing or shortness of breath.    . ALPRAZolam (XANAX) 0.5 MG tablet TAKE 2 TABLETS AT  BEDTIME 60 tablet 1  . Ascorbic Acid (VITAMIN C PO) Take 1 tablet by mouth every morning.    . budesonide (PULMICORT) 0.5 MG/2ML nebulizer solution Take 2 mLs (0.5 mg total) by nebulization 2 (two) times daily. Dx 496 120 mL 6  . Calcium Carbonate-Vitamin D (CALCIUM 600+D) 600-400 MG-UNIT per tablet Take 1 tablet by mouth daily at 6 PM.     . cholecalciferol (VITAMIN D) 1000 UNITS tablet Take 1,000 Units by mouth 2 (two) times daily with breakfast and lunch.     . CRESTOR 20 MG tablet TAKE 1 TABLET ONCE A DAY 30 tablet 5  . denosumab (PROLIA) 60 MG/ML SOLN injection Inject 60 mg into the skin every 6 (six) months. Administer in upper arm, thigh, or abdomen 1 mL 0  . dextromethorphan-guaiFENesin (MUCINEX DM) 30-600 MG per 12 hr tablet Take 1 tablet by mouth every 12 (twelve) hours. scheduled    . diltiazem (CARDIZEM CD) 120 MG 24 hr capsule Take 1 capsule (120 mg total) by mouth daily. 30 capsule 11  . fluticasone (FLONASE) 50 MCG/ACT nasal spray 2 SPRAYS IN EACH NOSTRIL ONCE A DAY 16 g 5  . levothyroxine (SYNTHROID, LEVOTHROID) 25 MCG tablet Take 50 mcg by mouth daily before breakfast.    . loratadine (CLARITIN) 10 MG tablet Take 10 mg by mouth daily.     . metoprolol (LOPRESSOR) 50 MG tablet Take 50 mg by mouth 2 (two) times daily.    . montelukast (SINGULAIR) 10 MG tablet TAKE  1 TABLET ONCE A DAY 30 tablet 4  . Multiple Vitamin (MULTIVITAMIN WITH MINERALS) TABS Take 1 tablet by mouth daily.    . nitroGLYCERIN (NITROSTAT) 0.4 MG SL tablet Place 0.4 mg under the tongue every 5 (five) minutes as needed. For chest pain    . omega-3 acid ethyl esters (LOVAZA) 1 G capsule TAKE (2) CAPSULES TWICE DAILY. 120 capsule 0  . omeprazole-sodium bicarbonate (ZEGERID) 40-1100 MG per capsule TAKE (1) CAPSULE DAILY 30 capsule 3  . ondansetron (ZOFRAN) 4 MG tablet TAKE 1 TABLET EVERY 12 HOURS AS NEEDED FOR NAUSEA 20 tablet 0  . sertraline (ZOLOFT) 100 MG tablet TAKE 1 TABLET ONCE A DAY 30 tablet 2  . traMADol  (ULTRAM) 50 MG tablet Take 1 tablet (50 mg total) by mouth every 6 (six) hours as needed for moderate pain or severe pain. 20 tablet 0  . XARELTO 20 MG TABS tablet TAKE 1 TABLET ONCE A DAY 30 tablet 1   No current facility-administered medications for this visit.    Past Medical History  Diagnosis Date  . GERD (gastroesophageal reflux disease)   . Anxiety disorder   . Arthritis   . CAD (coronary artery disease)     Cath May 2010.  Nonbstructive  . Depression   . Hypothyroid   . Asthmatic bronchitis   . Hypertension     dr Percival Spanish  . OA (osteoarthritis)   . Chronic bronchitis   . 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 Encephalitis     d/t meningitis  . PONV (postoperative nausea and vomiting)     history of cardiac arrest day 1 post surgery  in 2008  . Esophageal motility disorder   . Atrial fibrillation   . Hyperlipidemia   . Fibromyalgia   . Vitamin D deficiency   . Status post dilation of esophageal narrowing   . Bowel obstruction     blockage  . Anxiety     Past Surgical History  Procedure Laterality Date  . Total knee arthroplasty Right   . Back surgery    . Coronary angioplasty with stent placement  2003  . Sinus surgery with instatrak    . Knee arthroscopy  03/26/2012    Procedure: ARTHROSCOPY KNEE;  Surgeon: Wylene Simmer, MD;  Location: [facility];  Service: Orthopedics;  Laterality: Left;  with Debridement of Lateral Meniscus tear  . Rotator cuff repair Bilateral   . Ankle fusion  08/27/2012    Procedure: ARTHRODESIS ANKLE;  Surgeon: Wylene Simmer, MD;  Location: [facility];  Service: Orthopedics;  Laterality: Right;  Arthrodesis right ankle and subtalar joint  . Foot arthrodesis, subtalar Right 2013  . Removal of implant Right 03/31/2014    DR HEWITT  . Hardware revision  03/31/2014    SUBTALOR  ARTHRODESIS       DR HEWITT  . Hardware removal Right 03/31/2014    Procedure: REMOVAL OF DEEP IMPLANTS X 3  RIGHT ;  Surgeon: Wylene Simmer, MD;   Location: [facility];  Service: Orthopedics;  Laterality: Right;  . Arthrodesis tibiofibular Right 03/31/2014    Procedure: REVISION OF SUBTALOR ARTHRODESIS  RIGHT ;  Surgeon: Wylene Simmer, MD;  Location: [facility];  Service: Orthopedics;  Laterality: Right;  . Video bronchoscopy N/A 05/03/2014    Procedure: VIDEO BRONCHOSCOPY;  Surgeon: [name], MD;  Location[facility] OR;  Service: Thoracic;  Laterality: N/A;  . Video assisted thoracoscopy (vats)/empyema Right 05/03/2014    Procedure:  VIDEO ASSISTED THORACOSCOPY (VATS)/EMPYEMA;  Surgeon: [name], MD;  Location: [place];  Service: Thoracic;  Laterality: Right;  . Decortication Right 05/03/2014    Procedure: DECORTICATION;  Surgeon: [name], MD;  Location: [place];  Service: Thoracic;  Laterality: Right;  . Hardware removal Right 11/03/2014    Procedure: HARDWARE REMOVAL OF SUBTALAR JOINT;  Surgeon: Wylene Simmer, MD;  Location: [facility];  Service: Orthopedics;  Laterality: Right;    ROS:  As stated in the HPI and negative for all other systems.  PHYSICAL EXAM BP 140/82 mmHg  Pulse 63  Ht 5' (1.524 m)  Wt 152 lb (68.947 kg)  BMI 29.69 kg/m2 GENERAL:  Well appearing NECK:  No jugular venous distention, waveform within normal limits, carotid upstroke brisk and symmetric, no bruits, no thyromegaly LYMPHATICS:  No cervical, inguinal adenopathy LUNGS: decreased breath sounds with expiratory wheezes. BACK:  No CVA tenderness HEART:  PMI not displaced or sustained,S1 and S2 within normal limits, no S3, no S4, no clicks, no rubs, no murmurs ABD:  Flat, positive bowel sounds normal in frequency in pitch, no bruits, no rebound, no guarding, no midline pulsatile mass, no hepatomegaly, no splenomegaly EXT:  2 plus pulses throughout, no edema, no cyanosis no clubbing, right ankle swelling.  NEURO:  Nonfocal. SKIN:  No rashes, no nodules.   EKG:  Sinus rhythm, rate 63, axis within normal limits, intervals within normal limits,  no acute ST-T wave changes 11/25/2014  ASSESSMENT AND PLAN  CAD:  The patient has had no new symptoms since a negative stress perfusion in 2013.  She had no evidence of ischemia with her recent complex hospitalization. No further cardiac workup is suggested.  ATRIAL FIBRILLATION:  She had paroxysmal atrial fibrillation when she was acutely ill. She tolerates anticoagulation. No change in therapy is indicated.  HTN:  Her blood pressure is elevated but this might be due to that she's currently taking prednisone.  She will keep and I on this. No change in therapy is planned.  DIASTOLIC HF: She did have some evidence of mild volume overload with an elevated BNP during her hospitalization. I don't see a recent echo. However, she seems to be euvolemic today. I don't think any further cardiac workup is suggested. She will continue the meds as listed.  FATIGUE: This is a predominant complaint.  I will reduce her beta blocker and might need to stop this all together if this continues.  I will see her back to reassess.

## 2014-11-25 NOTE — Patient Instructions (Addendum)
Your physician recommends that you schedule a follow-up appointment in: 2 months with Dr. Percival Spanish   Decrease your metoprolol to 25 mg two times a day

## 2014-11-28 ENCOUNTER — Telehealth: Payer: Self-pay | Admitting: Family Medicine

## 2014-11-28 NOTE — Telephone Encounter (Signed)
Appointment given for tomorrow with Moore.  

## 2014-11-29 ENCOUNTER — Ambulatory Visit (INDEPENDENT_AMBULATORY_CARE_PROVIDER_SITE_OTHER): Payer: PRIVATE HEALTH INSURANCE | Admitting: Family Medicine

## 2014-11-29 ENCOUNTER — Telehealth: Payer: Self-pay | Admitting: Internal Medicine

## 2014-11-29 ENCOUNTER — Encounter: Payer: Self-pay | Admitting: Family Medicine

## 2014-11-29 VITALS — BP 136/81 | HR 69 | Temp 97.0°F | Ht 60.0 in | Wt 151.0 lb

## 2014-11-29 DIAGNOSIS — H9193 Unspecified hearing loss, bilateral: Secondary | ICD-10-CM

## 2014-11-29 DIAGNOSIS — R5382 Chronic fatigue, unspecified: Secondary | ICD-10-CM

## 2014-11-29 DIAGNOSIS — I4891 Unspecified atrial fibrillation: Secondary | ICD-10-CM

## 2014-11-29 DIAGNOSIS — H6123 Impacted cerumen, bilateral: Secondary | ICD-10-CM

## 2014-11-29 DIAGNOSIS — R195 Other fecal abnormalities: Secondary | ICD-10-CM

## 2014-11-29 DIAGNOSIS — K921 Melena: Secondary | ICD-10-CM

## 2014-11-29 DIAGNOSIS — R197 Diarrhea, unspecified: Secondary | ICD-10-CM

## 2014-11-29 NOTE — Progress Notes (Signed)
Subjective:    Patient ID: [name], female    DOB: [birth year], 78 y.o.   MRN: [id number]  HPI Patient here today for decreased hearing. Her audiologist asked that she has her ears washed out. She also complains of episodes of diarrhea.        Patient Active Problem List   Diagnosis Date Noted  . CHF (congestive heart failure) 04/30/2014  . Empyema, right 04/30/2014  . 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 Osteoporosis 01/27/2014  . Fibromyalgia 08/09/2013  . Asthma 08/01/2010  . Hypothyroidism 03/20/2010  . DEPRESSION 03/20/2010  . HTN (hypertension) 03/20/2010  . ESOPHAGEAL MOTILITY DISORDER 03/20/2010  . Hyperlipidemia 04/22/2009  . Cor athrscl-uns vessel 04/22/2009  . ATRIAL FIBRILLATION, PAROXYSMAL 04/22/2009  . ESOPHAGEAL STRICTURE 10/31/2004  . GERD 10/31/2004  . HIATAL HERNIA 10/06/2001   Outpatient Encounter Prescriptions as of 11/29/2014  Medication Sig  . acetaminophen (TYLENOL) 325 MG tablet Take 2 tablets (650 mg total) by mouth every 6 (six) hours as needed for mild pain or moderate pain.  Marland Kitchen albuterol (PROVENTIL HFA;VENTOLIN HFA) 108 (90 BASE) MCG/ACT inhaler Inhale into the lungs every 6 (six) hours as needed for wheezing or shortness of breath.  . ALPRAZolam (XANAX) 0.5 MG tablet TAKE 2 TABLETS AT BEDTIME  . Ascorbic Acid (VITAMIN C PO) Take 1 tablet by mouth every morning.  . budesonide (PULMICORT) 0.5 MG/2ML nebulizer solution Take 2 mLs (0.5 mg total) by nebulization 2 (two) times daily. Dx 496  . Calcium Carbonate-Vitamin D (CALCIUM 600+D) 600-400 MG-UNIT per tablet Take 1 tablet by mouth daily at 6 PM.   . cholecalciferol (VITAMIN D) 1000 UNITS tablet Take 1,000 Units by mouth 2 (two) times daily with breakfast and lunch.   . CRESTOR 20 MG tablet TAKE 1 TABLET ONCE A DAY  . denosumab (PROLIA) 60 MG/ML SOLN injection Inject 60 mg into the skin every 6 (six) months. Administer in upper arm, thigh, or abdomen  . dextromethorphan-guaiFENesin  (MUCINEX DM) 30-600 MG per 12 hr tablet Take 1 tablet by mouth every 12 (twelve) hours. scheduled  . diltiazem (CARDIZEM CD) 120 MG 24 hr capsule Take 1 capsule (120 mg total) by mouth daily.  . fluticasone (FLONASE) 50 MCG/ACT nasal spray 2 SPRAYS IN EACH NOSTRIL ONCE A DAY  . levothyroxine (SYNTHROID, LEVOTHROID) 25 MCG tablet Take 50 mcg by mouth daily before breakfast.  . loratadine (CLARITIN) 10 MG tablet Take 10 mg by mouth daily.   . metoprolol tartrate (LOPRESSOR) 25 MG tablet Take 25 mg by mouth 2 (two) times daily.  . montelukast (SINGULAIR) 10 MG tablet TAKE 1 TABLET ONCE A DAY  . Multiple Vitamin (MULTIVITAMIN WITH MINERALS) TABS Take 1 tablet by mouth daily.  . nitroGLYCERIN (NITROSTAT) 0.4 MG SL tablet Place 0.4 mg under the tongue every 5 (five) minutes as needed. For chest pain  . omega-3 acid ethyl esters (LOVAZA) 1 G capsule TAKE (2) CAPSULES TWICE DAILY.  Marland Kitchen omeprazole-sodium bicarbonate (ZEGERID) 40-1100 MG per capsule TAKE (1) CAPSULE DAILY  . ondansetron (ZOFRAN) 4 MG tablet TAKE 1 TABLET EVERY 12 HOURS AS NEEDED FOR NAUSEA  . sertraline (ZOLOFT) 100 MG tablet TAKE 1 TABLET ONCE A DAY  . traMADol (ULTRAM) 50 MG tablet Take 1 tablet (50 mg total) by mouth every 6 (six) hours as needed for moderate pain or severe pain.  Marland Kitchen XARELTO 20 MG TABS tablet TAKE 1 TABLET ONCE A DAY    Review of Systems  Constitutional: Negative.   HENT:  Negative.        Decrease in hearing  Eyes: Negative.   Respiratory: Negative.   Cardiovascular: Negative.   Gastrointestinal: Positive for diarrhea (mostly in the am).  Endocrine: Negative.   Genitourinary: Negative.   Musculoskeletal: Positive for back pain (low).  Skin: Negative.   Allergic/Immunologic: Negative.   Neurological: Negative.   Hematological: Negative.   Psychiatric/Behavioral: Negative.        Objective:   Physical Exam  Constitutional: She is oriented to person, place, and time. She appears well-developed and  well-nourished. No distress.  Alert  HENT:  Head: Normocephalic.  Right Ear: External ear normal.  Left Ear: External ear normal.  Nose: Nose normal.  Mouth/Throat: Oropharynx is clear and moist.  Eyes: Conjunctivae and EOM are normal. Pupils are equal, round, and reactive to light. Right eye exhibits no discharge. Left eye exhibits no discharge. No scleral icterus.  Neck: Normal range of motion. Neck supple. No thyromegaly present.  No carotid bruits  Cardiovascular: Normal rate, regular rhythm and normal heart sounds.   No murmur heard. The rhythm was regular today at 72/m  Pulmonary/Chest: Effort normal and breath sounds normal. No respiratory distress. She has no wheezes. She has no rales. She exhibits no tenderness.  Abdominal: Soft. Bowel sounds are normal. She exhibits no mass. There is tenderness. There is no rebound and no guarding.  There was slight tenderness in the left upper quadrant without organomegaly  Musculoskeletal: Normal range of motion. She exhibits no edema or tenderness.  The patient ambulates with a cane because of chronic ankle pain and arthritic problems in her lower extremities especially in the ankle.  Lymphadenopathy:    She has no cervical adenopathy.  Neurological: She is alert and oriented to person, place, and time.  Skin: Skin is warm and dry. No rash noted.  Psychiatric: She has a normal mood and affect. Her behavior is normal. Judgment and thought content normal.  Nursing note and vitals reviewed.  BP 136/81 mmHg  Pulse 69  Temp(Src) 97 F (36.1 C) (Oral)  Ht 5' (1.524 m)  Wt 151 lb (68.493 kg)  BMI 29.49 kg/m2        Assessment & Plan:  1. Excessive cerumen in ear canal, bilateral -cerumen was removed with ear curette  2. Hearing deficit, bilateral  3. Loose bowel movements - BMP8+EGFR  4. Chronic fatigue - POCT CBC - BMP8+EGFR  5. Blood in stool - POCT CBC -repeat FOBT  6. Atrial fibrillation, unspecified  Patient  Instructions  You may need to use DeBrox eardrops periodically to keep earwax softened so that it will come out on its own more easily  please hold caffeine and milk cheese ice cream and dairy products from your diet for the next few days.  Drink plenty of fluids  Continue to watch for blood in the stool  Return the FOBT   Arrie Senate MD

## 2014-11-29 NOTE — Addendum Note (Signed)
Addended by: Selmer Dominion on: 11/29/2014 10:59 AM   Modules accepted: Orders

## 2014-11-29 NOTE — Telephone Encounter (Signed)
Pt scheduled to see Alonza Bogus PA 11/30/14@10 :30am. Carlon to notify pt of appt, pt has been having bloody diarrhea for the past 2 weeks.

## 2014-11-29 NOTE — Patient Instructions (Addendum)
You may need to use DeBrox eardrops periodically to keep earwax softened so that it will come out on its own more easily  please hold caffeine and milk cheese ice cream and dairy products from your diet for the next few days.  Drink plenty of fluids  Continue to watch for blood in the stool  Return the FOBT

## 2014-11-30 ENCOUNTER — Ambulatory Visit (INDEPENDENT_AMBULATORY_CARE_PROVIDER_SITE_OTHER): Payer: Medicare Other | Admitting: Gastroenterology

## 2014-11-30 ENCOUNTER — Other Ambulatory Visit: Payer: Medicare Other

## 2014-11-30 ENCOUNTER — Telehealth: Payer: Self-pay | Admitting: *Deleted

## 2014-11-30 ENCOUNTER — Encounter: Payer: Self-pay | Admitting: Gastroenterology

## 2014-11-30 ENCOUNTER — Telehealth: Payer: Self-pay | Admitting: Family Medicine

## 2014-11-30 VITALS — BP 118/72 | HR 64 | Ht 60.0 in | Wt 153.4 lb

## 2014-11-30 DIAGNOSIS — R197 Diarrhea, unspecified: Secondary | ICD-10-CM

## 2014-11-30 DIAGNOSIS — Z7901 Long term (current) use of anticoagulants: Secondary | ICD-10-CM | POA: Insufficient documentation

## 2014-11-30 DIAGNOSIS — R195 Other fecal abnormalities: Secondary | ICD-10-CM | POA: Insufficient documentation

## 2014-11-30 LAB — CBC WITH DIFFERENTIAL
Basophils Absolute: 0.1 10*3/uL (ref 0.0–0.2)
Basos: 1 %
Eos: 4 %
Eosinophils Absolute: 0.3 10*3/uL (ref 0.0–0.4)
HCT: 36.3 % (ref 34.0–46.6)
Hemoglobin: 12.7 g/dL (ref 11.1–15.9)
Immature Grans (Abs): 0 10*3/uL (ref 0.0–0.1)
Immature Granulocytes: 0 %
Lymphocytes Absolute: 2.4 10*3/uL (ref 0.7–3.1)
Lymphs: 40 %
MCH: 31.7 pg (ref 26.6–33.0)
MCHC: 35 g/dL (ref 31.5–35.7)
MCV: 91 fL (ref 79–97)
Monocytes Absolute: 0.5 10*3/uL (ref 0.1–0.9)
Monocytes: 9 %
Neutrophils Absolute: 2.8 10*3/uL (ref 1.4–7.0)
Neutrophils Relative %: 46 %
Platelets: 370 10*3/uL (ref 150–379)
RBC: 4.01 x10E6/uL (ref 3.77–5.28)
RDW: 14.4 % (ref 12.3–15.4)
WBC: 6.1 10*3/uL (ref 3.4–10.8)

## 2014-11-30 LAB — BMP8+EGFR
BUN/Creatinine Ratio: 19 (ref 11–26)
BUN: 14 mg/dL (ref 8–27)
CO2: 23 mmol/L (ref 18–29)
Calcium: 9.5 mg/dL (ref 8.7–10.3)
Chloride: 95 mmol/L — ABNORMAL LOW (ref 97–108)
Creatinine, Ser: 0.75 mg/dL (ref 0.57–1.00)
GFR calc Af Amer: 90 mL/min/{1.73_m2} (ref >59)
GFR calc non Af Amer: 78 mL/min/{1.73_m2} (ref >59)
Glucose: 96 mg/dL (ref 65–99)
Potassium: 4.8 mmol/L (ref 3.5–5.2)
Sodium: 134 mmol/L (ref 134–144)

## 2014-11-30 MED ORDER — MOVIPREP 100 G PO SOLR: 1.0000 | Freq: Once | ORAL | Status: DC

## 2014-11-30 NOTE — Telephone Encounter (Signed)
  11/30/2014   RE: [name] DOB: [birth year] MRN: [id number]   Dear Dr. Percival Spanish,    We have scheduled the above patient for an Colonoscopy. Our records show that she is on anticoagulation therapy.   Please advise as to how long the patient may come off her therapy of Xarelto prior to the procedure, which is scheduled for 12-28-2014.  Please route the completed form to Evette Georges., CMA   Sincerely,    Hope Pigeon

## 2014-11-30 NOTE — Progress Notes (Signed)
Agree with initial assessment and plans 

## 2014-11-30 NOTE — Patient Instructions (Signed)
You have been scheduled for a colonoscopy. Please follow written instructions given to you at your visit today.  Please pick up your prep kit at the pharmacy within the next 1-3 days. If you use inhalers (even only as needed), please bring them with you on the day of your procedure.  You will be contacted by our office prior to your procedure for directions on holding your Xarelto.  If you do not hear from our office 1 week prior to your scheduled procedure, please call [phone] to discuss.   Please go to the basement for stool studies before leaving: Stool O & P GI pathogen panel  Stool culture

## 2014-11-30 NOTE — Telephone Encounter (Signed)
-----   Message from Chipper Herb, MD sent at 11/30/2014  7:52 AM EST ----- The CBC has a normal white blood cell count. The hemoglobin is good at 12.7. The platelet count is adequate. The blood sugar is good at 96. The creatinine, the most important kidney function test is within normal limits. The electrolytes are good except the chloride is slightly decreased but this is okay.

## 2014-11-30 NOTE — Progress Notes (Signed)
     11/30/2014 [name] [name] [id number] May 01, 1936   History of Present Illness:  This is a pleasant 78 year old female who is known to Dr. Henrene Pastor.  She presents to our office today for a couple of different issues.  First, she was recently found to be heme positive by her PCP on two occasions.  She does admit to seeing occasional bright red blood on the toilet paper, especially recently because she's had diarrhea and thinks that she is irritated from wiping so much.  Her Hgb is normal at 12.7 grams.  Her last colonoscopy was in 08/2004 at which time the study was normal it was recommended that she have another procedure in 10 years.  Her last EGD was in 04/2009 and was normal.  In regards to the diarrhea, she says that sometimes she has intermittent diarrhea, but for the past couple of weeks she has been having diarrhea 4-5 times per day, mostly in the morning and then it tapers off throughout the day.  Denies nocturnal BM's.  Denies recent antibiotic use.     Current Medications, Allergies, Past Medical History, Past Surgical History, Family History and Social History were reviewed in [employer] record.   Physical Exam: BP 118/72 mmHg  Pulse 64  Ht 5' (1.524 m)  Wt 153 lb 6 oz (69.57 kg)  BMI 29.95 kg/m2 General: Well developed white female in no acute distress Head: Normocephalic and atraumatic Eyes:  Sclerae anicteric, conjunctiva pink  Ears: Normal auditory acuity Lungs: Clear throughout to auscultation Heart: Regular rate and rhythm Abdomen: Soft, non-distended.  Normal bowel sounds.  Non-tender. Musculoskeletal: Symmetrical with no gross deformities  Extremities: No edema  Neurological: Alert oriented x 4, grossly non-focal Psychological:  Alert and cooperative. Normal mood and affect  Assessment and Recommendations: -Heme positive stool:  Sees occasional bright red blood that she thinks is from wiping so much with the diarrhea recently.  Hgb 12.7 grams  yesterday.  Last colonoscopy was 10 years ago.  Will schedule colonoscopy with Dr. Henrene Pastor.  The risks, benefits, and alternatives were discussed with the patient and she consents to proceed. -Chronic anticoagulation for atrial fibrillation, on Xarelto:  The risks benefits and alternatives to a temporary hold of anti-coagulants/anti-platelets for the procedure were discussed with the patient and she consents to proceed. Obtain clearance from Dr. Percival Spanish for ok to hold Xarelto prior to procedure. -Diarrhea:  Intermittent previously but worsened over the past two weeks.  She may have some IBS with her anxiety, but will check stool studies to rule out acute infectious process to explain recent worsening of diarrhea.

## 2014-12-01 LAB — OVA AND PARASITE EXAMINATION: OP: NONE SEEN

## 2014-12-01 NOTE — Telephone Encounter (Signed)
Patient aware.

## 2014-12-02 LAB — GASTROINTESTINAL PATHOGEN PANEL PCR
C. difficile Tox A/B, PCR: NEGATIVE
Campylobacter, PCR: NEGATIVE
Cryptosporidium, PCR: NEGATIVE
E coli (ETEC) LT/ST PCR: NEGATIVE
E coli (STEC) stx1/stx2, PCR: NEGATIVE
E coli 0157, PCR: NEGATIVE
Giardia lamblia, PCR: NEGATIVE
Norovirus, PCR: NEGATIVE
Rotavirus A, PCR: NEGATIVE
Salmonella, PCR: NEGATIVE
Shigella, PCR: NEGATIVE

## 2014-12-04 LAB — STOOL CULTURE

## 2014-12-08 ENCOUNTER — Encounter: Payer: Self-pay | Admitting: Internal Medicine

## 2014-12-13 ENCOUNTER — Other Ambulatory Visit: Payer: Self-pay

## 2014-12-13 MED ORDER — METOPROLOL TARTRATE 25 MG PO TABS: 25.0000 mg | ORAL_TABLET | Freq: Two times a day (BID) | ORAL | Status: DC

## 2014-12-14 NOTE — Telephone Encounter (Signed)
Patient notified of instructions below for holding Xarelto. Patient will hold Xarelto two days before procedure. Patient verbalized understanding.

## 2014-12-14 NOTE — Telephone Encounter (Signed)
[facility] to speak to Dr. Rosezella [place] nurse was transferred to [place] Pharmacist had to leave a message.

## 2014-12-14 NOTE — Telephone Encounter (Signed)
Pt has CHADS2 score of 2, would recommend holding 2 doses of Xarelto prior to colonoscopy.  Restart night of colonoscopy unless multiple polyps removed, then start 24 hrs after.

## 2014-12-16 HISTORY — PX: EYE SURGERY: SHX253

## 2014-12-20 ENCOUNTER — Telehealth: Payer: Self-pay | Admitting: Pharmacist

## 2014-12-21 ENCOUNTER — Other Ambulatory Visit: Payer: Medicaid Other

## 2014-12-21 DIAGNOSIS — Z1212 Encounter for screening for malignant neoplasm of rectum: Secondary | ICD-10-CM

## 2014-12-21 NOTE — Progress Notes (Signed)
Lab only 

## 2014-12-21 NOTE — Telephone Encounter (Signed)
Next prolia not due until 01/27/2015

## 2014-12-22 LAB — FECAL OCCULT BLOOD, IMMUNOCHEMICAL: Fecal Occult Bld: POSITIVE — AB

## 2014-12-23 ENCOUNTER — Other Ambulatory Visit (INDEPENDENT_AMBULATORY_CARE_PROVIDER_SITE_OTHER): Payer: Medicare Other

## 2014-12-23 DIAGNOSIS — R350 Frequency of micturition: Secondary | ICD-10-CM

## 2014-12-23 DIAGNOSIS — K921 Melena: Secondary | ICD-10-CM

## 2014-12-23 LAB — POCT URINALYSIS DIPSTICK
Bilirubin, UA: NEGATIVE
Blood, UA: NEGATIVE
Glucose, UA: NEGATIVE
Ketones, UA: NEGATIVE
Nitrite, UA: NEGATIVE
Protein, UA: NEGATIVE
Spec Grav, UA: 1.01
Urobilinogen, UA: NEGATIVE
pH, UA: 5

## 2014-12-23 LAB — POCT CBC
Granulocyte percent: 52 %G (ref 37–80)
HCT, POC: 39 % (ref 37.7–47.9)
Hemoglobin: 12.3 g/dL (ref 12.2–16.2)
Lymph, poc: 3.8 — AB (ref 0.6–3.4)
MCH, POC: 29 pg (ref 27–31.2)
MCHC: 31.7 g/dL — AB (ref 31.8–35.4)
MCV: 91.4 fL (ref 80–97)
MPV: 6.2 fL (ref 0–99.8)
POC Granulocyte: 4.8 (ref 2–6.9)
POC LYMPH PERCENT: 40.4 %L (ref 10–50)
Platelet Count, POC: 401 10*3/uL (ref 142–424)
RBC: 4.3 M/uL (ref 4.04–5.48)
RDW, POC: 13.5 %
WBC: 9.3 10*3/uL (ref 4.6–10.2)

## 2014-12-23 LAB — POCT UA - MICROSCOPIC ONLY
Bacteria, U Microscopic: NEGATIVE
Casts, Ur, LPF, POC: NEGATIVE
Crystals, Ur, HPF, POC: NEGATIVE
Mucus, UA: NEGATIVE
RBC, urine, microscopic: NEGATIVE
Yeast, UA: NEGATIVE

## 2014-12-23 MED ORDER — CIPROFLOXACIN HCL 500 MG PO TABS: 500.0000 mg | ORAL_TABLET | Freq: Two times a day (BID) | ORAL | Status: DC

## 2014-12-23 NOTE — Addendum Note (Signed)
Addended by: Zannie Cove on: 12/23/2014 03:47 PM   Modules accepted: Orders

## 2014-12-23 NOTE — Addendum Note (Signed)
Addended by: Selmer Dominion on: 12/23/2014 03:33 PM   Modules accepted: Orders

## 2014-12-23 NOTE — Progress Notes (Signed)
Lab only 

## 2014-12-23 NOTE — Progress Notes (Signed)
Patient aware of positive FOBT.      Per Dr. Tawanna Sat request, she will come in for CBC to check her hemoglobin levels.  She is scheduled for a colonoscopy next week.

## 2014-12-23 NOTE — Addendum Note (Signed)
Addended by: Shelbie Ammons on: 12/23/2014 01:09 PM   Modules accepted: Orders

## 2014-12-24 LAB — URINE CULTURE

## 2014-12-26 ENCOUNTER — Other Ambulatory Visit: Payer: Self-pay | Admitting: Family Medicine

## 2014-12-27 NOTE — Telephone Encounter (Signed)
Refilled tijll next visit 01/2015

## 2014-12-28 ENCOUNTER — Encounter: Payer: Self-pay | Admitting: Internal Medicine

## 2014-12-28 ENCOUNTER — Ambulatory Visit (AMBULATORY_SURGERY_CENTER): Payer: Medicare Other | Admitting: Internal Medicine

## 2014-12-28 VITALS — BP 160/75 | HR 60 | Temp 97.0°F | Resp 16 | Ht 60.0 in | Wt 153.0 lb

## 2014-12-28 DIAGNOSIS — D122 Benign neoplasm of ascending colon: Secondary | ICD-10-CM | POA: Diagnosis not present

## 2014-12-28 DIAGNOSIS — R195 Other fecal abnormalities: Secondary | ICD-10-CM | POA: Diagnosis not present

## 2014-12-28 DIAGNOSIS — R197 Diarrhea, unspecified: Secondary | ICD-10-CM

## 2014-12-28 DIAGNOSIS — I10 Essential (primary) hypertension: Secondary | ICD-10-CM | POA: Diagnosis not present

## 2014-12-28 MED ORDER — SODIUM CHLORIDE 0.9 % IV SOLN: 500.0000 mL | INTRAVENOUS | Status: DC

## 2014-12-28 NOTE — Patient Instructions (Addendum)
YOU HAD AN ENDOSCOPIC PROCEDURE TODAY AT THE  ENDOSCOPY CENTER: Refer to the procedure report that was given to you for any specific questions about what was found during the examination.  If the procedure report does not answer your questions, please call your gastroenterologist to clarify.  If you requested that your care partner not be given the details of your procedure findings, then the procedure report has been included in a sealed envelope for you to review at your convenience later.  YOU SHOULD EXPECT: Some feelings of bloating in the abdomen. Passage of more gas than usual.  Walking can help get rid of the air that was put into your GI tract during the procedure and reduce the bloating. If you had a lower endoscopy (such as a colonoscopy or flexible sigmoidoscopy) you may notice spotting of blood in your stool or on the toilet paper. If you underwent a bowel prep for your procedure, then you may not have a normal bowel movement for a few days.  DIET: Your first meal following the procedure should be a light meal and then it is ok to progress to your normal diet.  A half-sandwich or bowl of soup is an example of a good first meal.  Heavy or fried foods are harder to digest and may make you feel nauseous or bloated.  Likewise meals heavy in dairy and vegetables can cause extra gas to form and this can also increase the bloating.  Drink plenty of fluids but you should avoid alcoholic beverages for 24 hours.  ACTIVITY: Your care partner should take you home directly after the procedure.  You should plan to take it easy, moving slowly for the rest of the day.  You can resume normal activity the day after the procedure however you should NOT DRIVE or use heavy machinery for 24 hours (because of the sedation medicines used during the test).    SYMPTOMS TO REPORT IMMEDIATELY: A gastroenterologist can be reached at any hour.  During normal business hours, 8:30 AM to 5:00 PM Monday through Friday,  call [phone].  After hours and on weekends, please call the GI answering service at [phone] who will take a message and have the physician on call contact you.   Following lower endoscopy (colonoscopy or flexible sigmoidoscopy):  Excessive amounts of blood in the stool  Significant tenderness or worsening of abdominal pains  Swelling of the abdomen that is new, acute  Fever of 100F or higher  FOLLOW UP: If any biopsies were taken you will be contacted by phone or by letter within the next 1-3 weeks.  Call your gastroenterologist if you have not heard about the biopsies in 3 weeks.  Our staff will call the home number listed on your records the next business day following your procedure to check on you and address any questions or concerns that you may have at that time regarding the information given to you following your procedure. This is a courtesy call and so if there is no answer at the home number and we have not heard from you through the emergency physician on call, we will assume that you have returned to your regular daily activities without incident.  SIGNATURES/CONFIDENTIALITY: You and/or your care partner have signed paperwork which will be entered into your electronic medical record.  These signatures attest to the fact that that the information above on your After Visit Summary has been reviewed and is understood.  Full responsibility of the confidentiality of this   discharge information lies with you and/or your care-partner.    Handouts were given to your care partner on polyps, diverticulosis, hemorrhoids, and a high fiber diet with liberal fluid intake. You may resume your current medications today.  Per Dr. Henrene Pastor restart Xarelto today.  Also continue zegarid daily. Await biopsy results. Please call if any questions or concerns.

## 2014-12-28 NOTE — Op Note (Signed)
[facility]  [address] [place], [id number]   COLONOSCOPY PROCEDURE REPORT  PATIENT: [name], [name]  MR#: [id number] BIRTHDATE: [birth year] , 78  yrs. old GENDER: female ENDOSCOPIST: Eustace Quail, MD REFERRED ZG:YFVCBS Laurance Flatten, M.D. PROCEDURE DATE:  12/28/2014 PROCEDURE:   Colonoscopy with snare polypectomy x 2 First Screening Colonoscopy - Avg.  risk and is 50 yrs.  old or older - No.  Prior Negative Screening - Now for repeat screening. N/A  History of Adenoma - Now for follow-up colonoscopy & has been > or = to 3 yrs.  N/A  Polyps Removed Today? Yes. ASA CLASS:   Class III INDICATIONS:heme-positive stool.   Prior exam 08-2004 - negative MEDICATIONS: Monitored anesthesia care and Propofol 200 mg IV  DESCRIPTION OF PROCEDURE:   After the risks benefits and alternatives of the procedure were thoroughly explained, informed consent was obtained.  The digital rectal exam revealed no abnormalities of the rectum.   The LB [id number]  endoscope was introduced through the anus and advanced to the cecum, which was identified by both the appendix and ileocecal valve. No adverse events experienced.   The quality of the prep was excellent, using MoviPrep  The instrument was then slowly withdrawn as the colon was fully examined.  COLON FINDINGS: Two polyps measuring 2 mm in size were found in the ascending colon.  A polypectomy was performed with a cold snare. The resection was complete, the polyp tissue was completely retrieved and sent to histology.   There was moderate diverticulosis noted in the sigmoid colon.   The examination was otherwise normal.  Retroflexed views revealed internal hemorrhoids. The time to cecum=3 minutes 56 seconds.  Withdrawal time=8 minutes 44 seconds.  The scope was withdrawn and the procedure completed. COMPLICATIONS: There were no immediate complications.  ENDOSCOPIC IMPRESSION: 1.   Two polyps measuring 2 mm in size were found in  the ascending colon; polypectomy was performed with a cold snare 2.   Moderate diverticulosis was noted in the sigmoid colon 3.   The examination was otherwise normal  RECOMMENDATIONS: 1.  Resume Xarelto today 2.  Continue Zegarid daily 3.  Return to the care of your primary provider.  GI follow up as needed  eSigned:  Eustace Quail, MD 12/28/2014 3:08 PM   cc:

## 2014-12-28 NOTE — Progress Notes (Signed)
No problems noted in the recovery room. maw 

## 2014-12-28 NOTE — Progress Notes (Signed)
Called to room to assist during endoscopic procedure.  Patient ID and intended procedure confirmed with present staff. Received instructions for my participation in the procedure from the performing physician.  

## 2014-12-28 NOTE — Progress Notes (Signed)
Report to PACU, RN, vss, BBS= Clear.  

## 2014-12-29 ENCOUNTER — Telehealth: Payer: Self-pay | Admitting: *Deleted

## 2014-12-29 NOTE — Telephone Encounter (Signed)
  Follow up Call-  Call back number 12/28/2014  Post procedure Call Back phone  # [phone]  Permission to leave phone message Yes     Patient questions:  Do you have a fever, pain , or abdominal swelling? No. Pain Score  0 *  Have you tolerated food without any problems? Yes.    Have you been able to return to your normal activities? Yes.    Do you have any questions about your discharge instructions: Diet   No. Medications  No. Follow up visit  No.  Do you have questions or concerns about your Care? No.  Actions: * If pain score is 4 or above: No action needed, pain <4.

## 2014-12-30 ENCOUNTER — Ambulatory Visit: Payer: Medicare Other | Admitting: Internal Medicine

## 2015-01-03 ENCOUNTER — Encounter: Payer: Self-pay | Admitting: Internal Medicine

## 2015-01-04 ENCOUNTER — Other Ambulatory Visit: Payer: Self-pay | Admitting: Family Medicine

## 2015-01-04 NOTE — Telephone Encounter (Signed)
Last filled 12/05/14, call into [facility]

## 2015-01-08 DIAGNOSIS — J9690 Respiratory failure, unspecified, unspecified whether with hypoxia or hypercapnia: Secondary | ICD-10-CM | POA: Diagnosis not present

## 2015-01-08 DIAGNOSIS — E871 Hypo-osmolality and hyponatremia: Secondary | ICD-10-CM | POA: Diagnosis not present

## 2015-01-11 ENCOUNTER — Ambulatory Visit (INDEPENDENT_AMBULATORY_CARE_PROVIDER_SITE_OTHER): Payer: Medicare Other | Admitting: Family Medicine

## 2015-01-11 VITALS — BP 161/79 | HR 71 | Temp 97.7°F | Ht 60.0 in | Wt 155.4 lb

## 2015-01-11 DIAGNOSIS — N390 Urinary tract infection, site not specified: Secondary | ICD-10-CM

## 2015-01-11 DIAGNOSIS — M25552 Pain in left hip: Secondary | ICD-10-CM | POA: Diagnosis not present

## 2015-01-11 LAB — POCT URINALYSIS DIPSTICK
Bilirubin, UA: NEGATIVE
Blood, UA: NEGATIVE
Glucose, UA: 6
Ketones, UA: NEGATIVE
Nitrite, UA: NEGATIVE
Protein, UA: NEGATIVE
Spec Grav, UA: 1.015
Urobilinogen, UA: NEGATIVE
pH, UA: 6

## 2015-01-11 LAB — POCT UA - MICROSCOPIC ONLY
Casts, Ur, LPF, POC: NEGATIVE
Crystals, Ur, HPF, POC: NEGATIVE
Mucus, UA: NEGATIVE
Yeast, UA: NEGATIVE

## 2015-01-11 MED ORDER — CIPROFLOXACIN HCL 500 MG PO TABS: 500.0000 mg | ORAL_TABLET | Freq: Two times a day (BID) | ORAL | Status: DC

## 2015-01-11 MED ORDER — HYDROCODONE-ACETAMINOPHEN 5-325 MG PO TABS: 1.0000 | ORAL_TABLET | Freq: Four times a day (QID) | ORAL | Status: DC | PRN

## 2015-01-11 NOTE — Progress Notes (Signed)
   Subjective:    Patient ID: [name], female    DOB: [birth year], 79 y.o.   MRN: [id number]  HPI Patient is here for c/o left hip discomfort after falling last week.  She has bruise on left hip.  She had UTI and finished abx's last week.  She wants to do reflex UA. She uses a cane today but has walker at home and she states she was using walker when she fell a week ago.  She is using walker to ambulate today.  Review of Systems  Constitutional: Negative for fever.  HENT: Negative for ear pain.   Eyes: Negative for discharge.  Respiratory: Negative for cough.   Cardiovascular: Negative for chest pain.  Gastrointestinal: Negative for abdominal distention.  Endocrine: Negative for polyuria.  Genitourinary: Negative for difficulty urinating.  Musculoskeletal: Negative for gait problem and neck pain.  Skin: Negative for color change and rash.  Neurological: Negative for speech difficulty and headaches.  Psychiatric/Behavioral: Negative for agitation.       Objective:    BP 161/79 mmHg  Pulse 71  Temp(Src) 97.7 F (36.5 C) (Oral)  Ht 5' (1.524 m)  Wt 155 lb 6.4 oz (70.489 kg)  BMI 30.35 kg/m2 Physical Exam  Constitutional: She is oriented to person, place, and time. She appears well-developed and well-nourished.  HENT:  Head: Normocephalic and atraumatic.  Mouth/Throat: Oropharynx is clear and moist.  Eyes: Pupils are equal, round, and reactive to light.  Neck: Normal range of motion. Neck supple.  Cardiovascular: Normal rate and regular rhythm.   No murmur heard. Pulmonary/Chest: Effort normal and breath sounds normal.  Abdominal: Soft. Bowel sounds are normal. There is no tenderness.  Musculoskeletal: She exhibits tenderness.  TTP left hip and ecchymosis from left hip down left buttock and left thigh.  Gait unsteady with cane.  Neurological: She is alert and oriented to person, place, and time.  Skin: Skin is warm and dry.  Psychiatric: She has a normal mood and  affect.          Assessment & Plan:     ICD-9-CM ICD-10-CM   1. Left hip pain 719.45 M25.552 HYDROcodone-acetaminophen (NORCO) 5-325 MG per tablet  2. Urinary tract infection without hematuria, site unspecified 599.0 N39.0 POCT UA - Microscopic Only     POCT urinalysis dipstick   Explained to use heating pad on left hip hematoma and bruising to help with discomfort.  Discussed getting referral to PT for ambulation training if having difficulty with falls.  Return if symptoms worsen or fail to improve.  Lysbeth Penner FNP

## 2015-01-13 LAB — URINE CULTURE: Organism ID, Bacteria: NO GROWTH

## 2015-01-18 ENCOUNTER — Other Ambulatory Visit: Payer: Self-pay | Admitting: Family Medicine

## 2015-01-26 DIAGNOSIS — J449 Chronic obstructive pulmonary disease, unspecified: Secondary | ICD-10-CM | POA: Diagnosis not present

## 2015-01-31 ENCOUNTER — Ambulatory Visit: Payer: PRIVATE HEALTH INSURANCE | Admitting: Family Medicine

## 2015-01-31 ENCOUNTER — Ambulatory Visit: Payer: Medicare Other | Admitting: Cardiology

## 2015-02-01 ENCOUNTER — Ambulatory Visit (INDEPENDENT_AMBULATORY_CARE_PROVIDER_SITE_OTHER): Payer: Medicare Other | Admitting: Cardiology

## 2015-02-01 ENCOUNTER — Encounter: Payer: Self-pay | Admitting: Cardiology

## 2015-02-01 ENCOUNTER — Other Ambulatory Visit: Payer: Self-pay | Admitting: Family Medicine

## 2015-02-01 VITALS — BP 142/84 | HR 62 | Ht 61.0 in | Wt 154.3 lb

## 2015-02-01 DIAGNOSIS — I10 Essential (primary) hypertension: Secondary | ICD-10-CM

## 2015-02-01 DIAGNOSIS — I4891 Unspecified atrial fibrillation: Secondary | ICD-10-CM | POA: Diagnosis not present

## 2015-02-01 NOTE — Progress Notes (Signed)
HPI The patient presents for followup of known coronary disease and atrial fib.  Last year she had ankle surgery earlier in the year. She was [place] and developed apparently an aspiration pneumonia. She was hospitalized in May for this.  She had pneumonia with pleural effusion requiring chest tube and surgery. She had MRSA. She did have transient atrial fibrillation.   She is slowly recovered from all of this. Her biggest complaint has been fatigue. She doesn't have any overt cardiovascular complaints. She doesn't describe chest pressure, neck or arm discomfort. She doesn't have palpitations, presyncope or syncope. She doesn't have PND or orthopnea. She has no weight gain or edema.   Allergies  Allergen Reactions  . Aspirin Other (See Comments)    REACTION: regular strength causes "heart to beat fast"  . Captopril Hypertension  . Naproxen Other (See Comments)    Tongue swelling  . Penicillins     Swelling around site  . Sulfonamide Derivatives Hives  . Clindamycin/Lincomycin     Current Outpatient Prescriptions  Medication Sig Dispense Refill  . acetaminophen (TYLENOL) 325 MG tablet Take 2 tablets (650 mg total) by mouth every 6 (six) hours as needed for mild pain or moderate pain.    Marland Kitchen albuterol (PROVENTIL HFA;VENTOLIN HFA) 108 (90 BASE) MCG/ACT inhaler Inhale into the lungs every 6 (six) hours as needed for wheezing or shortness of breath.    . ALPRAZolam (XANAX) 0.5 MG tablet TAKE 2 TABLETS AT BEDTIME 60 tablet 1  . Ascorbic Acid (VITAMIN C PO) Take 1 tablet by mouth every morning.    . budesonide (PULMICORT) 0.5 MG/2ML nebulizer solution Take 2 mLs (0.5 mg total) by nebulization 2 (two) times daily. Dx 496 120 mL 6  . Calcium Carbonate-Vitamin D (CALCIUM 600+D) 600-400 MG-UNIT per tablet Take 1 tablet by mouth daily at 6 PM.     . cholecalciferol (VITAMIN D) 1000 UNITS tablet Take 1,000 Units by mouth 2 (two) times daily with breakfast and lunch.     . CRESTOR 20 MG tablet TAKE  1 TABLET ONCE A DAY 30 tablet 5  . denosumab (PROLIA) 60 MG/ML SOLN injection Inject 60 mg into the skin every 6 (six) months. Administer in upper arm, thigh, or abdomen 1 mL 0  . dextromethorphan-guaiFENesin (MUCINEX DM) 30-600 MG per 12 hr tablet Take 1 tablet by mouth every 12 (twelve) hours. scheduled    . diltiazem (CARDIZEM CD) 120 MG 24 hr capsule Take 1 capsule (120 mg total) by mouth daily. 30 capsule 11  . fluticasone (FLONASE) 50 MCG/ACT nasal spray 2 SPRAYS IN EACH NOSTRIL ONCE A DAY 16 g 5  . HYDROcodone-acetaminophen (NORCO) 5-325 MG per tablet Take 1 tablet by mouth every 6 (six) hours as needed for moderate pain. 20 tablet 0  . levothyroxine (SYNTHROID, LEVOTHROID) 25 MCG tablet Take 50 mcg by mouth daily before breakfast.    . loratadine (CLARITIN) 10 MG tablet Take 10 mg by mouth daily.     . metoprolol tartrate (LOPRESSOR) 25 MG tablet Take 1 tablet (25 mg total) by mouth 2 (two) times daily. 60 tablet 6  . montelukast (SINGULAIR) 10 MG tablet TAKE 1 TABLET ONCE A DAY 30 tablet 4  . Multiple Vitamin (MULTIVITAMIN WITH MINERALS) TABS Take 1 tablet by mouth daily.    . nitroGLYCERIN (NITROSTAT) 0.4 MG SL tablet Place 0.4 mg under the tongue every 5 (five) minutes as needed. For chest pain    . omega-3 acid ethyl esters (LOVAZA) 1 G capsule  TAKE (2) CAPSULES TWICE DAILY. 120 capsule 0  . omeprazole-sodium bicarbonate (ZEGERID) 40-1100 MG per capsule TAKE (1) CAPSULE DAILY 30 capsule 3  . ondansetron (ZOFRAN) 4 MG tablet TAKE 1 TABLET EVERY 12 HOURS AS NEEDED FOR NAUSEA 20 tablet 0  . sertraline (ZOLOFT) 100 MG tablet TAKE 1 TABLET ONCE A DAY 30 tablet 2  . traMADol (ULTRAM) 50 MG tablet Take 1 tablet (50 mg total) by mouth every 6 (six) hours as needed for moderate pain or severe pain. 20 tablet 0  . XARELTO 20 MG TABS tablet TAKE 1 TABLET ONCE A DAY 30 tablet 2   No current facility-administered medications for this visit.    Past Medical History  Diagnosis Date  . GERD  (gastroesophageal reflux disease)   . Anxiety disorder   . Arthritis   . CAD (coronary artery disease)     Cath May 2010.  Nonbstructive  . Depression   . Hypothyroid   . Asthmatic bronchitis   . Hypertension   . OA (osteoarthritis)   . Chronic bronchitis   . 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 Encephalitis     d/t meningitis  . PONV (postoperative nausea and vomiting)     history of cardiac arrest day 1 post surgery  in 2008  . Esophageal motility disorder   . Atrial fibrillation   . Hyperlipidemia   . Fibromyalgia   . Vitamin D deficiency   . Status post dilation of esophageal narrowing   . Bowel obstruction     blockage  . Anxiety   . Congestive heart disease     Preserved EF    Past Surgical History  Procedure Laterality Date  . Total knee arthroplasty Right   . Back surgery    . Coronary angioplasty with stent placement  2003  . Sinus surgery with instatrak    . Knee arthroscopy  03/26/2012    Procedure: ARTHROSCOPY KNEE;  Surgeon: Wylene Simmer, MD;  Location: [facility];  Service: Orthopedics;  Laterality: Left;  with Debridement of Lateral Meniscus tear  . Rotator cuff repair Bilateral   . Ankle fusion  08/27/2012    Procedure: ARTHRODESIS ANKLE;  Surgeon: Wylene Simmer, MD;  Location: [facility];  Service: Orthopedics;  Laterality: Right;  Arthrodesis right ankle and subtalar joint  . Foot arthrodesis, subtalar Right 2013  . Removal of implant Right 03/31/2014    DR HEWITT  . Hardware revision  03/31/2014    SUBTALOR  ARTHRODESIS       DR HEWITT  . Hardware removal Right 03/31/2014    Procedure: REMOVAL OF DEEP IMPLANTS X 3  RIGHT ;  Surgeon: Wylene Simmer, MD;  Location: [facility];  Service: Orthopedics;  Laterality: Right;  . Arthrodesis tibiofibular Right 03/31/2014    Procedure: REVISION OF SUBTALOR ARTHRODESIS  RIGHT ;  Surgeon: Wylene Simmer, MD;  Location: [facility];  Service: Orthopedics;  Laterality: Right;  . Video bronchoscopy N/A 05/03/2014     Procedure: VIDEO BRONCHOSCOPY;  Surgeon: [name], MD;  Location: [facility];  Service: Thoracic;  Laterality: N/A;  . Video assisted thoracoscopy (vats)/empyema Right 05/03/2014    Procedure: VIDEO ASSISTED THORACOSCOPY (VATS)/EMPYEMA;  Surgeon: [name], MD;  Location: [facility];  Service: Thoracic;  Laterality: Right;  . Decortication Right 05/03/2014    Procedure: DECORTICATION;  Surgeon: [name], MD;  Location: [facility];  Service: Thoracic;  Laterality: Right;  . Hardware removal Right 11/03/2014  Procedure: HARDWARE REMOVAL OF SUBTALAR JOINT;  Surgeon: Wylene Simmer, MD;  Location: [place] [facility];  Service: Orthopedics;  Laterality: Right;    ROS:  As stated in the HPI and negative for all other systems.  PHYSICAL EXAM BP 142/84 mmHg  Pulse 62  Ht 5\' 1"  (1.549 m)  Wt 154 lb 4.8 oz (69.99 kg)  BMI 29.17 kg/m2 GENERAL:  Well appearing NECK:  No jugular venous distention, waveform within normal limits, carotid upstroke brisk and symmetric, no bruits, no thyromegaly LUNGS: decreased breath sounds with expiratory wheezes. BACK:  No CVA tenderness HEART:  PMI not displaced or sustained,S1 and S2 within normal limits, no S3, no S4, no clicks, no rubs, no murmurs ABD:  Flat, positive bowel sounds normal in frequency in pitch, no bruits, no rebound, no guarding, no midline pulsatile mass, no hepatomegaly, no splenomegaly EXT:  2 plus pulses throughout, no edema, no cyanosis no clubbing, right ankle swelling.    EKG:  Sinus rhythm, rate 63, axis within normal limits, intervals within normal limits, no acute ST-T wave changes 02/01/2015  ASSESSMENT AND PLAN  CAD:  The patient has had no new symptoms since a negative stress perfusion in 2013.    ATRIAL FIBRILLATION:  She had paroxysmal atrial fibrillation when she was acutely ill. She tolerates anticoagulation. No change in therapy is indicated.  I did review the GI records. Although there was some guaiac positive  stool apparently and polyps she's not anemic and she was approved to go back on anticoagulant.  HTN:  The blood pressure is at target. No change in medications is indicated. We will continue with therapeutic lifestyle changes (TLC).  DIASTOLIC HF: She seems to be euvolemic.  No change in therapy is indicated.   FATIGUE: This is a predominant complaint.  However, she says she doesn't sleep at all. This certainly would be a primary contributor and I will defer management to Redge Gainer, MD

## 2015-02-01 NOTE — Patient Instructions (Signed)
Your physician recommends that you schedule a follow-up appointment in: one year with Dr. Hochrein  

## 2015-02-02 ENCOUNTER — Ambulatory Visit: Payer: Medicare Other | Admitting: Family Medicine

## 2015-02-06 ENCOUNTER — Ambulatory Visit (INDEPENDENT_AMBULATORY_CARE_PROVIDER_SITE_OTHER): Payer: Medicare Other | Admitting: Family Medicine

## 2015-02-06 ENCOUNTER — Encounter: Payer: Self-pay | Admitting: Family Medicine

## 2015-02-06 ENCOUNTER — Ambulatory Visit (INDEPENDENT_AMBULATORY_CARE_PROVIDER_SITE_OTHER): Payer: Medicare Other

## 2015-02-06 VITALS — BP 170/87 | HR 69 | Temp 97.1°F | Ht 61.0 in | Wt 155.0 lb

## 2015-02-06 DIAGNOSIS — K219 Gastro-esophageal reflux disease without esophagitis: Secondary | ICD-10-CM | POA: Diagnosis not present

## 2015-02-06 DIAGNOSIS — I480 Paroxysmal atrial fibrillation: Secondary | ICD-10-CM

## 2015-02-06 DIAGNOSIS — E559 Vitamin D deficiency, unspecified: Secondary | ICD-10-CM | POA: Diagnosis not present

## 2015-02-06 DIAGNOSIS — M25511 Pain in right shoulder: Secondary | ICD-10-CM

## 2015-02-06 DIAGNOSIS — I10 Essential (primary) hypertension: Secondary | ICD-10-CM | POA: Diagnosis not present

## 2015-02-06 DIAGNOSIS — E785 Hyperlipidemia, unspecified: Secondary | ICD-10-CM | POA: Diagnosis not present

## 2015-02-06 DIAGNOSIS — E039 Hypothyroidism, unspecified: Secondary | ICD-10-CM | POA: Diagnosis not present

## 2015-02-06 LAB — POCT CBC
Granulocyte percent: 52.2 %G (ref 37–80)
HCT, POC: 38.1 % (ref 37.7–47.9)
Hemoglobin: 12.1 g/dL — AB (ref 12.2–16.2)
Lymph, poc: 2.7 (ref 0.6–3.4)
MCH, POC: 29.1 pg (ref 27–31.2)
MCHC: 31.6 g/dL — AB (ref 31.8–35.4)
MCV: 92 fL (ref 80–97)
MPV: 6.4 fL (ref 0–99.8)
POC Granulocyte: 3.7 (ref 2–6.9)
POC LYMPH PERCENT: 38.2 %L (ref 10–50)
Platelet Count, POC: 361 10*3/uL (ref 142–424)
RBC: 4.15 M/uL (ref 4.04–5.48)
RDW, POC: 12.8 %
WBC: 7.1 10*3/uL (ref 4.6–10.2)

## 2015-02-06 NOTE — Patient Instructions (Addendum)
Medicare Annual Wellness Visit  [facility] and the medical providers at [facility] strive to bring you the best medical care.  In doing so we not only want to address your current medical conditions and concerns but also to detect new conditions early and prevent illness, disease and health-related problems.    Medicare offers a yearly Wellness Visit which allows our clinical staff to assess your need for preventative services including immunizations, lifestyle education, counseling to decrease risk of preventable diseases and screening for fall risk and other medical concerns.    This visit is provided free of charge (no copay) for all Medicare recipients. The clinical pharmacists at [facility] have begun to conduct these Wellness Visits which will also include a thorough review of all your medications.    As you primary medical provider recommend that you make an appointment for your Annual Wellness Visit if you have not done so already this year.  You may set up this appointment before you leave today or you may call back ([phone]) and schedule an appointment.  Please make sure when you call that you mention that you are scheduling your Annual Wellness Visit with the clinical pharmacist so that the appointment may be made for the proper length of time.     Continue current medications. Continue good therapeutic lifestyle changes which include good diet and exercise. Fall precautions discussed with patient. If an FOBT was given today- please return it to our front desk. If you are over 22 years old - you may need Prevnar 69 or the adult Pneumonia vaccine.  Flu Shots are still available at our office. If you still haven't had one please call to set up a nurse visit to get one.   After your visit with [place] today you will receive a survey in the mail or online from [employer] regarding your care with [place]. Please take a moment to  fill this out. Your feedback is very important to [place] as you can help [place] better understand your patient needs as well as improve your experience and satisfaction. WE CARE ABOUT YOU!!!   Continue follow-up with cardiology as discussed with him in 1 year Continue to be careful do not put yourself at risk for falling and move slowly Try to exercise as much as possible using her arms and your legs and even the pedal cycle that you put at your feet pedal with Watch your sodium intake Monitor blood pressures over the next couple of weeks and bring them by for review along with pulse rates. We will call you with your lab work results once they become available as well as the x-ray report from the radiologist. We will schedule you for a visit with the orthopedic surgeon regarding your shoulder.

## 2015-02-06 NOTE — Progress Notes (Signed)
Subjective:    Patient ID: [name], female    DOB: 06/20/1936, 79 y.o.   MRN: [id number]  HPI Pt here for follow up and management of chronic medical problems which includes hypertension, hyperlipidemia, and hypothyroid. She is taking medications regularly. The patient saw the cardiologist in January and he wants to see her back in 1 year unless there is any problems. He did reduce her Lopressor to 50 mg daily, 20 5 in the morning and 20 5 at night. She has not noticed that her blood pressure is been any higher since this reduction was done. This reduction in the Lopressor was mostly because she felt fatigued and she says this fatigue does seem to be better. She denies any chest pain. She denies any shortness of breath or problems with her stomach or voiding habits. She is concerned about the blood in the stool situation and she did have a colonoscopy and polyps were removed and she was told by the gastroenterologist that she had diverticulosis. We will recheck the FOBT.         Patient Active Problem List   Diagnosis Date Noted  . Diarrhea 11/30/2014  . Heme + stool 11/30/2014  . Chronic anticoagulation 11/30/2014  . CHF (congestive heart failure) 04/30/2014  . 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 Osteoporosis 01/27/2014  . Fibromyalgia 08/09/2013  . Asthma 08/01/2010  . Hypothyroidism 03/20/2010  . DEPRESSION 03/20/2010  . HTN (hypertension) 03/20/2010  . ESOPHAGEAL MOTILITY DISORDER 03/20/2010  . Hyperlipidemia 04/22/2009  . Cor athrscl-uns vessel 04/22/2009  . ATRIAL FIBRILLATION, PAROXYSMAL 04/22/2009  . ESOPHAGEAL STRICTURE 10/31/2004  . GERD 10/31/2004  . HIATAL HERNIA 10/06/2001   Outpatient Encounter Prescriptions as of 02/06/2015  Medication Sig  . acetaminophen (TYLENOL) 325 MG tablet Take 2 tablets (650 mg total) by mouth every 6 (six) hours as needed for mild pain or moderate pain.  Marland Kitchen albuterol (PROVENTIL HFA;VENTOLIN HFA) 108 (90 BASE) MCG/ACT  inhaler Inhale into the lungs every 6 (six) hours as needed for wheezing or shortness of breath.  . ALPRAZolam (XANAX) 0.5 MG tablet TAKE 2 TABLETS AT BEDTIME  . Ascorbic Acid (VITAMIN C PO) Take 1 tablet by mouth every morning.  . budesonide (PULMICORT) 0.5 MG/2ML nebulizer solution Take 2 mLs (0.5 mg total) by nebulization 2 (two) times daily. Dx 496  . Calcium Carbonate-Vitamin D (CALCIUM 600+D) 600-400 MG-UNIT per tablet Take 1 tablet by mouth daily at 6 PM.   . cholecalciferol (VITAMIN D) 1000 UNITS tablet Take 1,000 Units by mouth 2 (two) times daily with breakfast and lunch.   . CRESTOR 20 MG tablet TAKE 1 TABLET ONCE A DAY  . denosumab (PROLIA) 60 MG/ML SOLN injection Inject 60 mg into the skin every 6 (six) months. Administer in upper arm, thigh, or abdomen  . dextromethorphan-guaiFENesin (MUCINEX DM) 30-600 MG per 12 hr tablet Take 1 tablet by mouth every 12 (twelve) hours. scheduled  . diltiazem (CARDIZEM CD) 120 MG 24 hr capsule Take 1 capsule (120 mg total) by mouth daily.  . fluticasone (FLONASE) 50 MCG/ACT nasal spray 2 SPRAYS IN EACH NOSTRIL ONCE A DAY  . levothyroxine (SYNTHROID, LEVOTHROID) 25 MCG tablet Take 50 mcg by mouth daily before breakfast.  . loratadine (CLARITIN) 10 MG tablet Take 10 mg by mouth daily.   . metoprolol tartrate (LOPRESSOR) 25 MG tablet Take 1 tablet (25 mg total) by mouth 2 (two) times daily.  . montelukast (SINGULAIR) 10 MG tablet TAKE 1 TABLET ONCE A DAY  .  Multiple Vitamin (MULTIVITAMIN WITH MINERALS) TABS Take 1 tablet by mouth daily.  . nitroGLYCERIN (NITROSTAT) 0.4 MG SL tablet Place 0.4 mg under the tongue every 5 (five) minutes as needed. For chest pain  . omega-3 acid ethyl esters (LOVAZA) 1 G capsule TAKE (2) CAPSULES TWICE DAILY.  Marland Kitchen omeprazole-sodium bicarbonate (ZEGERID) 40-1100 MG per capsule TAKE (1) CAPSULE DAILY  . ondansetron (ZOFRAN) 4 MG tablet TAKE 1 TABLET EVERY 12 HOURS AS NEEDED FOR NAUSEA  . sertraline (ZOLOFT) 100 MG tablet TAKE  1 TABLET ONCE A DAY  . traMADol (ULTRAM) 50 MG tablet Take 1 tablet (50 mg total) by mouth every 6 (six) hours as needed for moderate pain or severe pain.  Marland Kitchen XARELTO 20 MG TABS tablet TAKE 1 TABLET ONCE A DAY  . [DISCONTINUED] HYDROcodone-acetaminophen (NORCO) 5-325 MG per tablet Take 1 tablet by mouth every 6 (six) hours as needed for moderate pain.    Review of Systems  Constitutional: Negative.   HENT: Negative.   Eyes: Negative.   Respiratory: Negative.   Cardiovascular: Negative.   Gastrointestinal: Negative.   Endocrine: Negative.   Genitourinary: Negative.   Musculoskeletal: Positive for arthralgias (right foot pain - followed by Dr Doran Durand --- right shoulder pain).  Skin: Negative.   Allergic/Immunologic: Negative.   Neurological: Negative.   Hematological: Negative.   Psychiatric/Behavioral: Negative.        Objective:   Physical Exam  Constitutional: She is oriented to person, place, and time. She appears well-developed and well-nourished. No distress.  The patient is pleasant and alert but frustrated with the fact that she can't walk and be more independent because of her continued and ongoing right foot and ankle pain.  HENT:  Head: Normocephalic and atraumatic.  Right Ear: External ear normal.  Left Ear: External ear normal.  Nose: Nose normal.  Mouth/Throat: Oropharynx is clear and moist. No oropharyngeal exudate.  Eyes: Conjunctivae and EOM are normal. Pupils are equal, round, and reactive to light. Right eye exhibits no discharge. Left eye exhibits no discharge. No scleral icterus.  Neck: Normal range of motion. Neck supple. No thyromegaly present.  Without bruits or adenopathy  Cardiovascular: Normal rate, regular rhythm, normal heart sounds and intact distal pulses.   No murmur heard. The heart is regular at 72/m  Pulmonary/Chest: Effort normal and breath sounds normal. No respiratory distress. She has no wheezes. She has no rales. She exhibits no tenderness.    The patient has a dry cough but in no rales or wheezes  Abdominal: Soft. Bowel sounds are normal. She exhibits no mass. There is no tenderness. There is no rebound and no guarding.  Musculoskeletal: Normal range of motion. She exhibits tenderness. She exhibits no edema.  There is some tenderness in the ankle. The right ankle is is inverted and somewhat out of position with is normal position. This is secondary to the multiple surgeries she's had.  Lymphadenopathy:    She has no cervical adenopathy.  Neurological: She is alert and oriented to person, place, and time. She has normal reflexes. No cranial nerve deficit.  Skin: Skin is warm and dry. No rash noted.  Psychiatric: She has a normal mood and affect. Her behavior is normal. Judgment and thought content normal.  Nursing note and vitals reviewed.  BP 168/84 mmHg  Pulse 69  Temp(Src) 97.1 F (36.2 C) (Oral)  Ht '5\' 1"'  (1.549 m)  Wt 155 lb (70.308 kg)  BMI 29.30 kg/m2  Repeat blood pressure by me in the right  arm was 160/90.  [facility] reading (PRIMARY) by  Dr. Governor Rooks shoulder---  Degenerative changes and  osteoporotic changes.                                     Assessment & Plan:  1. Hyperlipidemia -Continue with Crestor and aggressive therapeutic lifestyle changes - POCT CBC - NMR, lipoprofile  2. Essential hypertension -Bring blood pressures for review in a couple weeks and continue current treatment - POCT CBC - BMP8+EGFR - Hepatic function panel  3. Gastroesophageal reflux disease, esophagitis presence not specified -Continue with Zegerid as the patient has had no complaints with reflux. - POCT CBC  4. Hypothyroidism, unspecified hypothyroidism type -Continue with current therapy pending results of lab work - POCT CBC - Thyroid Panel With TSH  5. Vitamin D deficiency -Continue with vitamin D pending results of lab work - POCT CBC - Vit D  25 hydroxy (rtn osteoporosis monitoring)  6. Paroxysmal atrial  fibrillation -The patient is doing well with this as she is in normal sinus rhythm today and has no complaints related to recurring bouts of atrial fibrillation this time. - POCT CBC  7. Right shoulder pain -The patient had a previous rotator cuff repair in visit shoulder 14 or 15 years ago and has not injured it but we will re-x-ray it and have her see the orthopedic surgeon for further evaluation - DG Shoulder Right; Future  No orders of the defined types were placed in this encounter.   Patient Instructions                       Medicare Annual Wellness Visit  [facility] and the medical providers at [facility] strive to bring you the best medical care.  In doing so we not only want to address your current medical conditions and concerns but also to detect new conditions early and prevent illness, disease and health-related problems.    Medicare offers a yearly Wellness Visit which allows our clinical staff to assess your need for preventative services including immunizations, lifestyle education, counseling to decrease risk of preventable diseases and screening for fall risk and other medical concerns.    This visit is provided free of charge (no copay) for all Medicare recipients. The clinical pharmacists at [facility] have begun to conduct these Wellness Visits which will also include a thorough review of all your medications.    As you primary medical provider recommend that you make an appointment for your Annual Wellness Visit if you have not done so already this year.  You may set up this appointment before you leave today or you may call back ([phone]) and schedule an appointment.  Please make sure when you call that you mention that you are scheduling your Annual Wellness Visit with the clinical pharmacist so that the appointment may be made for the proper length of time.     Continue current medications. Continue good therapeutic  lifestyle changes which include good diet and exercise. Fall precautions discussed with patient. If an FOBT was given today- please return it to our front desk. If you are over 28 years old - you may need Prevnar 46 or the adult Pneumonia vaccine.  Flu Shots are still available at our office. If you still haven't had one please call to set up a nurse visit to get one.   After  your visit with [place] today you will receive a survey in the mail or online from [employer] regarding your care with [place]. Please take a moment to fill this out. Your feedback is very important to [place] as you can help [place] better understand your patient needs as well as improve your experience and satisfaction. WE CARE ABOUT YOU!!!   Continue follow-up with cardiology as discussed with him in 1 year Continue to be careful do not put yourself at risk for falling and move slowly Try to exercise as much as possible using her arms and your legs and even the pedal cycle that you put at your feet pedal with Watch your sodium intake Monitor blood pressures over the next couple of weeks and bring them by for review along with pulse rates. We will call you with your lab work results once they become available as well as the x-ray report from the radiologist. We will schedule you for a visit with the orthopedic surgeon regarding your shoulder.    Arrie Senate MD

## 2015-02-07 ENCOUNTER — Telehealth: Payer: Self-pay | Admitting: Pulmonary Disease

## 2015-02-07 LAB — NMR, LIPOPROFILE
Cholesterol: 137 mg/dL (ref 100–199)
HDL Cholesterol by NMR: 72 mg/dL (ref >39)
HDL Particle Number: 38.4 umol/L (ref >=30.5)
LDL Particle Number: 443 nmol/L (ref <1000)
LDL Size: 20.9 nm (ref >20.5)
LDL-C: 55 mg/dL (ref 0–99)
LP-IR Score: <25 (ref <=45)
Small LDL Particle Number: <90 nmol/L (ref <=527)
Triglycerides by NMR: 49 mg/dL (ref 0–149)

## 2015-02-07 LAB — BMP8+EGFR
BUN/Creatinine Ratio: 20 (ref 11–26)
BUN: 16 mg/dL (ref 8–27)
CO2: 24 mmol/L (ref 18–29)
Calcium: 9.6 mg/dL (ref 8.7–10.3)
Chloride: 97 mmol/L (ref 97–108)
Creatinine, Ser: 0.8 mg/dL (ref 0.57–1.00)
GFR calc Af Amer: 83 mL/min/{1.73_m2} (ref >59)
GFR calc non Af Amer: 72 mL/min/{1.73_m2} (ref >59)
Glucose: 90 mg/dL (ref 65–99)
Potassium: 5.2 mmol/L (ref 3.5–5.2)
Sodium: 137 mmol/L (ref 134–144)

## 2015-02-07 LAB — HEPATIC FUNCTION PANEL
ALT: 15 IU/L (ref 0–32)
AST: 19 IU/L (ref 0–40)
Albumin: 4.6 g/dL (ref 3.5–4.8)
Alkaline Phosphatase: 58 IU/L (ref 39–117)
Bilirubin Total: 0.3 mg/dL (ref 0.0–1.2)
Bilirubin, Direct: 0.12 mg/dL (ref 0.00–0.40)
Total Protein: 7.4 g/dL (ref 6.0–8.5)

## 2015-02-07 LAB — THYROID PANEL WITH TSH
Free Thyroxine Index: 1.9 (ref 1.2–4.9)
T3 Uptake Ratio: 27 % (ref 24–39)
T4, Total: 7 ug/dL (ref 4.5–12.0)
TSH: 2.37 u[IU]/mL (ref 0.450–4.500)

## 2015-02-07 LAB — VITAMIN D 25 HYDROXY (VIT D DEFICIENCY, FRACTURES): Vit D, 25-Hydroxy: 56.6 ng/mL (ref 30.0–100.0)

## 2015-02-07 MED ORDER — PREDNISONE (PAK) 5 MG PO TABS: ORAL_TABLET | ORAL | Status: DC

## 2015-02-07 MED ORDER — LEVOFLOXACIN 500 MG PO TABS: 500.0000 mg | ORAL_TABLET | Freq: Every day | ORAL | Status: DC

## 2015-02-07 NOTE — Telephone Encounter (Signed)
Per SN- call in levaquin 500 mg # 7 QD, take OTC Align, and pred dose pack 5 mg  Needs ov with RA in 2 wks  Spoke with the pt and notified of this and she verbalized understanding  Rxs were sent and ov with RA for 02/28/15 at 2:45

## 2015-02-07 NOTE — Telephone Encounter (Signed)
Spoke with pt. States that for the last few days she has been having increased coughing and wheezing. Cough is producing yellow mucus. Denies chest tightness. Would like prednisone sent in.  Allergies  Allergen Reactions  . Aspirin Other (See Comments)    REACTION: regular strength causes "heart to beat fast"  . Captopril Hypertension  . Naproxen Other (See Comments)    Tongue swelling  . Penicillins     Swelling around site  . Sulfonamide Derivatives Hives  . Clindamycin/Lincomycin     SN - please advise for RA (he is 3pm [facility] provider). Thanks.

## 2015-02-08 DIAGNOSIS — J9690 Respiratory failure, unspecified, unspecified whether with hypoxia or hypercapnia: Secondary | ICD-10-CM | POA: Diagnosis not present

## 2015-02-08 DIAGNOSIS — E871 Hypo-osmolality and hyponatremia: Secondary | ICD-10-CM | POA: Diagnosis not present

## 2015-02-10 ENCOUNTER — Telehealth: Payer: Self-pay | Admitting: Pulmonary Disease

## 2015-02-10 MED ORDER — BENZONATATE 200 MG PO CAPS: 200.0000 mg | ORAL_CAPSULE | Freq: Four times a day (QID) | ORAL | Status: DC | PRN

## 2015-02-10 NOTE — Telephone Encounter (Signed)
Prednisone 10 mg take  4 each am x 2 days,   2 each am x 2 days,  1 each am x 2 days and stop   Delsym is the strongest non narcotic cough med Can supplement this with ultram already on list and ok to call in refill x 40

## 2015-02-10 NOTE — Telephone Encounter (Signed)
Fine with me Take tessilon 200 mg four times daily as neede #40 2 refills

## 2015-02-10 NOTE — Telephone Encounter (Signed)
Called and spoke to pt. Informed her of the medication. Rx sent to preferred pharmacy. Pt verbalized understanding and denied any further questions or concerns at this time.

## 2015-02-10 NOTE — Telephone Encounter (Signed)
Called and spoke to pt. Pt stated she is already on pred taper and levaquin that was started on 02/07/15. Pt stated she is requesting Tessalon 200mg  for cough suppression.   MW please advise.

## 2015-02-10 NOTE — Telephone Encounter (Signed)
Spoke with pt. Reports still having cough and wheezing. She has started antibiotic that we sent in earlier this week. Coughing is getting worse. She can't afford narcotic cough syrups. Would like to know what to do about cough.  MW- please advise for RA as he is on vacation today.

## 2015-02-15 ENCOUNTER — Other Ambulatory Visit: Payer: Self-pay | Admitting: Family Medicine

## 2015-02-16 ENCOUNTER — Other Ambulatory Visit: Payer: Self-pay | Admitting: *Deleted

## 2015-02-16 MED ORDER — OMEGA-3-ACID ETHYL ESTERS 1 G PO CAPS: ORAL_CAPSULE | ORAL | Status: DC

## 2015-02-17 ENCOUNTER — Telehealth: Payer: Self-pay | Admitting: Pulmonary Disease

## 2015-02-17 NOTE — Telephone Encounter (Signed)
Pt is calling back. Wants Rx today - [phone]

## 2015-02-17 NOTE — Telephone Encounter (Signed)
Should not be taking lovaza when coughing so hard as can cause cough  Ok to call in tessalon max dose is 200 mg every 4 hours prn  Max dose neb is qh4h prn and if not comfortable on that needs to go to ER

## 2015-02-17 NOTE — Telephone Encounter (Signed)
Alva pt - Patient has taken a round of prednisone and a round of antibiotics and she cannot get the cough under control.  Wheezing a lot.  Unable to cough up anything.  Patient's chest is very tight and she is very short winded.  Can hear wheezing over the phone.  Using Neb twice a day.  Using [employer], she took 2 of the Tessalon perles at one time and she said that helped her.  Patient would like something called into [facility].    Sent to Dr. Melvyn Novas (DOD) in Dr. Bari Mantis absence.

## 2015-02-17 NOTE — Telephone Encounter (Signed)
Called made pt aware of below. She already has the tess pearls and neb medication. She will stop taking the lovaza to see if it helps. Aware to seek emergency care if worsens or no better

## 2015-02-21 DIAGNOSIS — J449 Chronic obstructive pulmonary disease, unspecified: Secondary | ICD-10-CM | POA: Diagnosis not present

## 2015-02-22 DIAGNOSIS — M19011 Primary osteoarthritis, right shoulder: Secondary | ICD-10-CM | POA: Diagnosis not present

## 2015-02-27 ENCOUNTER — Telehealth: Payer: Self-pay | Admitting: Family Medicine

## 2015-02-28 ENCOUNTER — Ambulatory Visit (INDEPENDENT_AMBULATORY_CARE_PROVIDER_SITE_OTHER): Payer: Medicare Other | Admitting: Pulmonary Disease

## 2015-02-28 ENCOUNTER — Encounter: Payer: Self-pay | Admitting: Pulmonary Disease

## 2015-02-28 ENCOUNTER — Ambulatory Visit (INDEPENDENT_AMBULATORY_CARE_PROVIDER_SITE_OTHER)
Admission: RE | Admit: 2015-02-28 | Discharge: 2015-02-28 | Disposition: A | Discharge status: Home / Self Care | Payer: Medicare Other | Source: Ambulatory Visit | Attending: Pulmonary Disease | Admitting: Pulmonary Disease

## 2015-02-28 VITALS — BP 122/74 | HR 71 | Temp 97.0°F | Ht 60.0 in | Wt 154.4 lb

## 2015-02-28 DIAGNOSIS — J90 Pleural effusion, not elsewhere classified: Secondary | ICD-10-CM

## 2015-02-28 NOTE — Patient Instructions (Signed)
CXR today Stay on budesonide wice daily - rinse mouth after use - if voice remians raspy, OK to cut down to once /daily Tessalon perles as needed for cough

## 2015-02-28 NOTE — Progress Notes (Signed)
   Subjective:    Patient ID: [name], female    DOB: [birth year], 79 y.o.   MRN: [id number]  HPI  78/F remote ex smoker with reactive airway disease - ? GERD vs sinusitis, nml pFTs  She smoked a PPD x 20-25 yrs before quitting in 1990. She develops recurrent chest colds requiring several rounds of Abx to get better.    Significant tests/ events  SHe underwent esophageal dilation for hypertensive LES (Dr Olevia Perches) & is on protonix two times a day . Evaluation for chest pain in 05/2009 (Dr Percival Spanish) -no obstructive CAD - attributed to esophageal spasm.Sucralfate added by GI to zegerid .  Took macrodantin x 2 yrs for UTIs , stopped 2010  Prednisone makes her hyper and nervous. Has osteoporosis with fractures.  PFTs nml '11 .  06/2013 Spirometry -no obstruction   RAST - IgE 225 -high but no sensitivity to common indoor/ outdoor allergens  Underwent endoscopic sinus surgery 1/12 by dr Constance Holster  Treated x 6 wks with clinda for chronic sinusitis, started wheezing again when ABx stopped    Admitted 04/2014  for acute hypoxic respiratory failure, secondary to pneumonia, asthma, exacerbation, congestive heart failure, and MRSA empyema requiring vats on May 19, complicated by an element of vocal cord dysfunction, and chronic cough. Patient did have upper airway wheezing, consistent with vocal cord dysfunction. ENT eval >> laryngoscopy bedside- consistent with diffuse inflammation of the vocal cords, and supraglottic larynx.     02/28/2015  Chief Complaint  Patient presents with  . Follow-up    reports breathing is getting better. C/o hoarseness/raspy voice. Wheezing is better. Still has dry cough(worse at night), runny nose, PND, nasal cong.    01/2015 cough + wheeze >> pred given, lovaza stopped, tessalon really helped 4 month follow up.  Reports breathing is doing well since last ov.  Does mention some recent cough and congestion but symptoms have improved.  Denies chest pain,  shortness of breath , fever, orthopnea, PND or leg swelling Ambulates with cane Complains of raspy voice  Review of Systems neg for any significant sore throat, dysphagia, itching, sneezing, nasal congestion or excess/ purulent secretions, fever, chills, sweats, unintended wt loss, pleuritic or exertional cp, hempoptysis, orthopnea pnd or change in chronic leg swelling. Also denies presyncope, palpitations, heartburn, abdominal pain, nausea, vomiting, diarrhea or change in bowel or urinary habits, dysuria,hematuria, rash, arthralgias, visual complaints, headache, numbness weakness or ataxia.     Objective:   Physical Exam  Gen. Pleasant, well-nourished, in no distress ENT - no lesions, no post nasal drip Neck: No JVD, no thyromegaly, no carotid bruits Lungs: no use of accessory muscles, no dullness to percussion, clear without rales or rhonchi  Cardiovascular: Rhythm regular, heart sounds  normal, no murmurs or gallops, no peripheral edema Musculoskeletal: No deformities, no cyanosis or clubbing         Assessment & Plan:

## 2015-02-28 NOTE — Assessment & Plan Note (Signed)
CXR today Stay on budesonide wice daily - rinse mouth after use - if voice remians raspy, OK to cut down to once /daily Tessalon perles as needed for cough

## 2015-03-01 ENCOUNTER — Telehealth: Payer: Self-pay | Admitting: Pulmonary Disease

## 2015-03-01 NOTE — Telephone Encounter (Signed)
OK to restart Lovaza & see if cough worsens

## 2015-03-01 NOTE — Telephone Encounter (Signed)
Pt states that when she had bronchitis and she was put on Tessalon Perles for cough and was advised to stop Lovaza as this could be causing the cough to worsen.  Pt states that she doesn't ever cough and never had a chronic cough outside of having bronchitis. Pt states that since Bronchitis has cleared up, she has not been coughing at all, she is a little raspy today but feels this is due to allergies. Pt wants to know if is possible that the Lovaza is NOT causing issues as she never had chronic cough prior to bronchitis.  Pt has not started back on Lovaza, would like to know if she needs to stay off this and have her Cardiologist put her on something else.   Inge Rise, CMA at 02/17/2015 5:13 PM     Status: Signed       Expand All Collapse All   Called made pt aware of below. She already has the tess pearls and neb medication. She will stop taking the lovaza to see if it helps. Aware to seek emergency care if worsens or no better            Tanda Rockers, MD at 02/17/2015 5:08 PM     Status: Signed       Expand All Collapse All   Should not be taking lovaza when coughing so hard as can cause cough  Ok to call in tessalon max dose is 200 mg every 4 hours prn  Max dose neb is qh4h prn and if not comfortable on that needs to go to ER       Please advise Dr Elsworth Soho. Thanks.

## 2015-03-01 NOTE — Telephone Encounter (Signed)
Patient notified.  Nothing further needed. 

## 2015-03-01 NOTE — Telephone Encounter (Signed)
Prolia is at [facility] for patient to pick up.  Appt for 03/02/15 for adminsitration

## 2015-03-02 ENCOUNTER — Ambulatory Visit (INDEPENDENT_AMBULATORY_CARE_PROVIDER_SITE_OTHER): Payer: Medicare Other | Admitting: Pharmacist

## 2015-03-02 ENCOUNTER — Other Ambulatory Visit: Payer: Self-pay | Admitting: Family Medicine

## 2015-03-02 ENCOUNTER — Other Ambulatory Visit: Payer: Self-pay | Admitting: Internal Medicine

## 2015-03-02 DIAGNOSIS — M810 Age-related osteoporosis without current pathological fracture: Secondary | ICD-10-CM

## 2015-03-02 MED ORDER — DENOSUMAB 60 MG/ML ~~LOC~~ SOLN
60.0000 mg | Freq: Once | SUBCUTANEOUS | Status: AC
Start: 2015-03-02 — End: 2015-03-02
  Administered 2015-03-02: 60 mg via SUBCUTANEOUS

## 2015-03-02 NOTE — Patient Instructions (Signed)

## 2015-03-02 NOTE — Progress Notes (Signed)
Patient ID: [name], female   DOB: [birth year], 79 y.o.   MRN: [id number]    [name] is a 79 y.o. female who presents for  Osteoporosis and Prolia Administration  Last injection was 07/27/2014   Current Problems (verified) Patient Active Problem List   Diagnosis Date Noted  . Diarrhea 11/30/2014  . Heme + stool 11/30/2014  . Chronic anticoagulation 11/30/2014  . CHF (congestive heart failure) 04/30/2014  . 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 Osteoporosis 01/27/2014  . Fibromyalgia 08/09/2013  . Asthma 08/01/2010  . Hypothyroidism 03/20/2010  . DEPRESSION 03/20/2010  . HTN (hypertension) 03/20/2010  . ESOPHAGEAL MOTILITY DISORDER 03/20/2010  . Hyperlipidemia 04/22/2009  . Cor athrscl-uns vessel 04/22/2009  . ATRIAL FIBRILLATION, PAROXYSMAL 04/22/2009  . ESOPHAGEAL STRICTURE 10/31/2004  . GERD 10/31/2004  . HIATAL HERNIA 10/06/2001     Current Medications (verified) Current Outpatient Prescriptions  Medication Sig Dispense Refill  . acetaminophen (TYLENOL) 325 MG tablet Take 2 tablets (650 mg total) by mouth every 6 (six) hours as needed for mild pain or moderate pain.    Marland Kitchen albuterol (PROVENTIL HFA;VENTOLIN HFA) 108 (90 BASE) MCG/ACT inhaler Inhale into the lungs every 6 (six) hours as needed for wheezing or shortness of breath.    . ALPRAZolam (XANAX) 0.5 MG tablet TAKE 2 TABLETS AT BEDTIME 60 tablet 1  . Ascorbic Acid (VITAMIN C PO) Take 1 tablet by mouth every morning.    . benzonatate (TESSALON) 200 MG capsule Take 1 capsule (200 mg total) by mouth 4 (four) times daily as needed for cough. 40 capsule 2  . budesonide (PULMICORT) 0.5 MG/2ML nebulizer solution Take 2 mLs (0.5 mg total) by nebulization 2 (two) times daily. Dx 496 120 mL 6  . Calcium Carbonate-Vitamin D (CALCIUM 600+D) 600-400 MG-UNIT per tablet Take 1 tablet by mouth daily at 6 PM.     . cholecalciferol (VITAMIN D) 1000 UNITS tablet Take 1,000 Units by mouth 2 (two) times daily with  breakfast and lunch.     . CRESTOR 20 MG tablet TAKE 1 TABLET ONCE A DAY 30 tablet 5  . denosumab (PROLIA) 60 MG/ML SOLN injection Inject 60 mg into the skin every 6 (six) months. Administer in upper arm, thigh, or abdomen 1 mL 0  . dextromethorphan-guaiFENesin (MUCINEX DM) 30-600 MG per 12 hr tablet Take 1 tablet by mouth 2 (two) times daily as needed. scheduled    . diltiazem (CARDIZEM CD) 120 MG 24 hr capsule Take 1 capsule (120 mg total) by mouth daily. 30 capsule 11  . fluticasone (FLONASE) 50 MCG/ACT nasal spray 2 SPRAYS IN EACH NOSTRIL ONCE A DAY 16 g 5  . levothyroxine (SYNTHROID, LEVOTHROID) 25 MCG tablet Take 50 mcg by mouth daily before breakfast.    . loratadine (CLARITIN) 10 MG tablet Take 10 mg by mouth daily.     . metoprolol tartrate (LOPRESSOR) 25 MG tablet Take 1 tablet (25 mg total) by mouth 2 (two) times daily. 60 tablet 6  . montelukast (SINGULAIR) 10 MG tablet TAKE 1 TABLET ONCE A DAY 30 tablet 4  . Multiple Vitamin (MULTIVITAMIN WITH MINERALS) TABS Take 1 tablet by mouth daily.    . nitroGLYCERIN (NITROSTAT) 0.4 MG SL tablet Place 0.4 mg under the tongue every 5 (five) minutes as needed. For chest pain    . omega-3 acid ethyl esters (LOVAZA) 1 G capsule TAKE (2) CAPSULES TWICE DAILY. 120 capsule 0  . omega-3 acid ethyl esters (LOVAZA) 1 G capsule TAKE (2)  CAPSULES TWICE DAILY. 120 capsule 4  . omeprazole-sodium bicarbonate (ZEGERID) 40-1100 MG per capsule TAKE (1) CAPSULE DAILY 30 capsule 3  . ondansetron (ZOFRAN) 4 MG tablet TAKE 1 TABLET EVERY 12 HOURS AS NEEDED FOR NAUSEA 20 tablet 0  . PROAIR HFA 108 (90 BASE) MCG/ACT inhaler USE 2 PUFFS EVERY 6 HOURS AS NEEDED FOR WHEEZING 8.5 g 5  . sertraline (ZOLOFT) 100 MG tablet TAKE 1 TABLET ONCE A DAY 30 tablet 2  . traMADol (ULTRAM) 50 MG tablet Take 1 tablet (50 mg total) by mouth every 6 (six) hours as needed for moderate pain or severe pain. 20 tablet 0  . XARELTO 20 MG TABS tablet TAKE 1 TABLET ONCE A DAY 30 tablet 2   No  current facility-administered medications for this visit.     Allergies (verified) Aspirin; Captopril; Naproxen; Penicillins; Sulfonamide derivatives; and Clindamycin/lincomycin   PAST HISTORY  Family History Family History  Problem Relation Age of Onset  . Prostate cancer Brother   . Cancer Brother     PROSTATE  . Heart disease Mother   . Asthma Mother   . Congestive Heart Failure Mother   . Emphysema Sister   . COPD Sister   . Stroke Sister   . Heart disease Sister     Social History History  Substance Use Topics  . Smoking status: Former Smoker -- 1.00 packs/day for 15 years    Types: Cigarettes    Quit date: 12/16/1989  . Smokeless tobacco: Never Used  . Alcohol Use: No        Objective:   There is no weight on file to calculate BMI. There were no vitals taken for this visit.   DEXA Results Date of Test T-Score for AP Spine L1-L4 T-Score for Total Left Hip T-Score for Total Right Hip  07/27/2014 -1.5 -1.6 -1.9  07/15/2012 -1.8` -1.6 -2.0  05/29/2011 -2.2 -1.7 -2.2  02/16/2007 -2.1 -1.1 -1.6   Lowest T-Score at L-1 was -3.2 *(05/29/2011)  Assessment:     Osteoporosis - improved BMD since starting Prolia      Plan:     1.  prolia 60mg  SQ administered today - patient brought prolia from home / pharmacy.  2.  Continue calcium 1200mg  daily from supplementation and diet 3.  Weight bearing exercise as able.  4.  Fall prevention discussed.  5.  Recheck DEXA 0/07/2016  [name], PharmD, CPP

## 2015-03-03 ENCOUNTER — Other Ambulatory Visit: Payer: Self-pay | Admitting: Family Medicine

## 2015-03-04 NOTE — Telephone Encounter (Signed)
Last filled 02/03/15, last seen 02/06/15. Call into [facility]

## 2015-03-07 ENCOUNTER — Other Ambulatory Visit: Payer: Medicare Other

## 2015-03-07 DIAGNOSIS — Z1212 Encounter for screening for malignant neoplasm of rectum: Secondary | ICD-10-CM | POA: Diagnosis not present

## 2015-03-07 NOTE — Progress Notes (Signed)
Lab only 

## 2015-03-09 DIAGNOSIS — E871 Hypo-osmolality and hyponatremia: Secondary | ICD-10-CM | POA: Diagnosis not present

## 2015-03-09 DIAGNOSIS — J9690 Respiratory failure, unspecified, unspecified whether with hypoxia or hypercapnia: Secondary | ICD-10-CM | POA: Diagnosis not present

## 2015-03-09 LAB — FECAL OCCULT BLOOD, IMMUNOCHEMICAL: Fecal Occult Bld: NEGATIVE

## 2015-03-13 ENCOUNTER — Telehealth: Payer: Self-pay | Admitting: Family Medicine

## 2015-03-14 ENCOUNTER — Telehealth: Payer: Self-pay | Admitting: Family Medicine

## 2015-03-14 ENCOUNTER — Encounter: Payer: Self-pay | Admitting: Family Medicine

## 2015-03-14 NOTE — Telephone Encounter (Signed)
Pt called about BP readings   DWM wants her to cont monitoring them and to bring more readings by in 4 weeks - this was left as a detailed message on VM

## 2015-03-20 ENCOUNTER — Encounter: Payer: Self-pay | Admitting: Family

## 2015-03-20 ENCOUNTER — Ambulatory Visit (INDEPENDENT_AMBULATORY_CARE_PROVIDER_SITE_OTHER): Payer: Medicare Other

## 2015-03-20 ENCOUNTER — Telehealth: Payer: Self-pay | Admitting: Family Medicine

## 2015-03-20 ENCOUNTER — Ambulatory Visit (INDEPENDENT_AMBULATORY_CARE_PROVIDER_SITE_OTHER): Payer: Medicare Other | Admitting: Family

## 2015-03-20 VITALS — BP 137/77 | HR 68 | Temp 97.3°F | Resp 16 | Ht 60.0 in

## 2015-03-20 DIAGNOSIS — W19XXXA Unspecified fall, initial encounter: Secondary | ICD-10-CM

## 2015-03-20 DIAGNOSIS — S93601A Unspecified sprain of right foot, initial encounter: Secondary | ICD-10-CM | POA: Diagnosis not present

## 2015-03-20 DIAGNOSIS — M79671 Pain in right foot: Secondary | ICD-10-CM | POA: Diagnosis not present

## 2015-03-20 MED ORDER — METHYLPREDNISOLONE (PAK) 4 MG PO TABS: ORAL_TABLET | ORAL | Status: DC

## 2015-03-20 MED ORDER — HYDROCODONE-ACETAMINOPHEN 5-325 MG PO TABS: 1.0000 | ORAL_TABLET | Freq: Four times a day (QID) | ORAL | Status: DC | PRN

## 2015-03-20 MED ORDER — KETOROLAC TROMETHAMINE 30 MG/ML IJ SOLN
30.0000 mg | Freq: Once | INTRAMUSCULAR | Status: AC
  Administered 2015-03-20: 30 mg via INTRAMUSCULAR

## 2015-03-20 MED ORDER — MELOXICAM 7.5 MG PO TABS: 7.5000 mg | ORAL_TABLET | Freq: Every day | ORAL | Status: DC

## 2015-03-20 NOTE — Patient Instructions (Signed)
Foot Sprain The muscles and cord like structures which attach muscle to bone (tendons) that surround the feet are made up of units. A foot sprain can occur at the weakest spot in any of these units. This condition is most often caused by injury to or overuse of the foot, as from playing contact sports, or aggravating a previous injury, or from poor conditioning, or obesity. SYMPTOMS  Pain with movement of the foot.  Tenderness and swelling at the injury site.  Loss of strength is present in moderate or severe sprains. THE THREE GRADES OR SEVERITY OF FOOT SPRAIN ARE:  Mild (Grade I): Slightly pulled muscle without tearing of muscle or tendon fibers or loss of strength.  Moderate (Grade II): Tearing of fibers in a muscle, tendon, or at the attachment to bone, with small decrease in strength.  Severe (Grade III): Rupture of the muscle-tendon-bone attachment, with separation of fibers. Severe sprain requires surgical repair. Often repeating (chronic) sprains are caused by overuse. Sudden (acute) sprains are caused by direct injury or over-use. DIAGNOSIS  Diagnosis of this condition is usually by your own observation. If problems continue, a caregiver may be required for further evaluation and treatment. X-rays may be required to make sure there are not breaks in the bones (fractures) present. Continued problems may require physical therapy for treatment. PREVENTION  Use strength and conditioning exercises appropriate for your sport.  Warm up properly prior to working out.  Use athletic shoes that are made for the sport you are participating in.  Allow adequate time for healing. Early return to activities makes repeat injury more likely, and can lead to an unstable arthritic foot that can result in prolonged disability. Mild sprains generally heal in 3 to 10 days, with moderate and severe sprains taking 2 to 10 weeks. Your caregiver can help you determine the proper time required for  healing. HOME CARE INSTRUCTIONS   Apply ice to the injury for 15-20 minutes, 03-04 times per day. Put the ice in a plastic bag and place a towel between the bag of ice and your skin.  An elastic wrap (like an Ace bandage) may be used to keep swelling down.  Keep foot above the level of the heart, or at least raised on a footstool, when swelling and pain are present.  Try to avoid use other than gentle range of motion while the foot is painful. Do not resume use until instructed by your caregiver. Then begin use gradually, not increasing use to the point of pain. If pain does develop, decrease use and continue the above measures, gradually increasing activities that do not cause discomfort, until you gradually achieve normal use.  Use crutches if and as instructed, and for the length of time instructed.  Keep injured foot and ankle wrapped between treatments.  Massage foot and ankle for comfort and to keep swelling down. Massage from the toes up towards the knee.  Only take over-the-counter or prescription medicines for pain, discomfort, or fever as directed by your caregiver. SEEK IMMEDIATE MEDICAL CARE IF:   Your pain and swelling increase, or pain is not controlled with medications.  You have loss of feeling in your foot or your foot turns cold or blue.  You develop new, unexplained symptoms, or an increase of the symptoms that brought you to your caregiver. MAKE SURE YOU:   Understand these instructions.  Will watch your condition.  Will get help right away if you are not doing well or get worse. Document Released:   05/24/2002 Document Revised: 02/24/2012 Document Reviewed: 07/21/2008 ExitCare Patient Information 2015 [employer]. This information is not intended to replace advice given to you by your health care provider. Make sure you discuss any questions you have with your health care provider.  

## 2015-03-20 NOTE — Progress Notes (Signed)
   Subjective:    Patient ID: [name], female    DOB: 29-Jul-1936, 79 y.o.   MRN: [id number]  Foot Injury  The incident occurred 12 to 24 hours ago. The injury mechanism was a fall. The pain is present in the right foot. The pain is at a severity of 10/10. The pain is mild. The pain has been constant since onset. Associated symptoms include an inability to bear weight. Pertinent negatives include no loss of sensation, numbness or tingling. She reports no foreign bodies present. She has tried elevation, acetaminophen and rest for the symptoms. The treatment provided mild relief.      Review of Systems  Constitutional: Negative.   HENT: Negative.   Eyes: Negative.   Respiratory: Negative.  Negative for shortness of breath.   Cardiovascular: Negative.  Negative for palpitations.  Gastrointestinal: Negative.   Endocrine: Negative.   Genitourinary: Negative.   Musculoskeletal: Negative.   Neurological: Negative.  Negative for tingling, numbness and headaches.  Hematological: Negative.   Psychiatric/Behavioral: Negative.   All other systems reviewed and are negative.      Objective:   Physical Exam  Constitutional: She is oriented to person, place, and time. She appears well-developed and well-nourished. No distress.  HENT:  Head: Normocephalic and atraumatic.  Right Ear: External ear normal.  Left Ear: External ear normal.  Nose: Nose normal.  Mouth/Throat: Oropharynx is clear and moist.  Eyes: Pupils are equal, round, and reactive to light.  Neck: Normal range of motion. Neck supple. No thyromegaly present.  Cardiovascular: Normal rate, regular rhythm, normal heart sounds and intact distal pulses.   No murmur heard. Pulmonary/Chest: Effort normal and breath sounds normal. No respiratory distress. She has no wheezes.  Abdominal: Soft. Bowel sounds are normal. She exhibits no distension. There is no tenderness.  Musculoskeletal: Normal range of motion. She exhibits edema  (Trace amt of swelling of right foot, pulses good) and tenderness.  Neurological: She is alert and oriented to person, place, and time. She has normal reflexes. No cranial nerve deficit.  Skin: Skin is warm and dry.  Psychiatric: She has a normal mood and affect. Her behavior is normal. Judgment and thought content normal.  Vitals reviewed.   BP 137/77 mmHg  Pulse 68  Temp(Src) 97.3 F (36.3 C) (Oral)  Resp 16  Ht 5' (1.524 m)  X-ray- No fracture present Preliminary reading by Evelina Dun, FNP [facility]      Assessment & Plan:  1. Fall, initial encounter - DG Ankle Complete Right; Future  2. Right foot sprain, initial encounter -Ice -Rest- If it hurts don't do it -Elevation - ketorolac (TORADOL) 30 MG/ML injection 30 mg; Inject 1 mL (30 mg total) into the muscle once. - meloxicam (MOBIC) 7.5 MG tablet; Take 1 tablet (7.5 mg total) by mouth daily.  Dispense: 30 tablet; Refill: 0 - HYDROcodone-acetaminophen (NORCO/VICODIN) 5-325 MG per tablet; Take 1 tablet by mouth every 6 (six) hours as needed for moderate pain.  Dispense: 30 tablet; Refill: 0 - methylPREDNIsolone (MEDROL DOSPACK) 4 MG tablet; follow package directions  Dispense: 21 tablet; Refill: 0  Evelina Dun, FNP

## 2015-03-20 NOTE — Telephone Encounter (Signed)
Appointment given for today at 2 with [facility]

## 2015-03-23 ENCOUNTER — Other Ambulatory Visit: Payer: Self-pay | Admitting: *Deleted

## 2015-03-23 MED ORDER — NITROGLYCERIN 0.4 MG SL SUBL: 0.4000 mg | SUBLINGUAL_TABLET | SUBLINGUAL | Status: DC | PRN

## 2015-03-28 ENCOUNTER — Other Ambulatory Visit: Payer: Self-pay | Admitting: Family Medicine

## 2015-03-29 NOTE — Telephone Encounter (Signed)
Last seen 03/17/15  [name]

## 2015-04-03 ENCOUNTER — Other Ambulatory Visit: Payer: Self-pay | Admitting: Family Medicine

## 2015-04-04 ENCOUNTER — Telehealth: Payer: Self-pay | Admitting: Cardiology

## 2015-04-04 NOTE — Telephone Encounter (Signed)
Lattie Haw is calling in to see if the pt would be a good candidate for the CHF program.

## 2015-04-04 NOTE — Telephone Encounter (Signed)
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

## 2015-04-05 ENCOUNTER — Telehealth: Payer: Self-pay | Admitting: Family Medicine

## 2015-04-05 DIAGNOSIS — S93601A Unspecified sprain of right foot, initial encounter: Secondary | ICD-10-CM

## 2015-04-05 NOTE — Telephone Encounter (Signed)
Referral sent per pt's request

## 2015-04-05 NOTE — Telephone Encounter (Signed)
Thanks but she doesn't really need HF clinic.

## 2015-04-06 NOTE — Telephone Encounter (Signed)
Returned call to [place] with [facility] no answer.Left message on her personal voice mail Dr.Hochrein advised she does not need heart failure clinic.

## 2015-04-10 DIAGNOSIS — S93601D Unspecified sprain of right foot, subsequent encounter: Secondary | ICD-10-CM | POA: Diagnosis not present

## 2015-04-21 ENCOUNTER — Other Ambulatory Visit: Payer: Self-pay | Admitting: Family Medicine

## 2015-04-27 ENCOUNTER — Other Ambulatory Visit: Payer: Self-pay | Admitting: Family Medicine

## 2015-05-01 ENCOUNTER — Other Ambulatory Visit: Payer: Self-pay | Admitting: Family Medicine

## 2015-05-01 NOTE — Telephone Encounter (Signed)
Last filled 04/04/15, last seen 02/06/15. Call into [facility]

## 2015-05-01 NOTE — Telephone Encounter (Signed)
Script called to pharmacy

## 2015-05-01 NOTE — Telephone Encounter (Signed)
Lat seen 03/20/15 [name]  If approved route to nurse to call into [facility]

## 2015-05-03 ENCOUNTER — Encounter: Payer: Self-pay | Admitting: Family Medicine

## 2015-05-03 ENCOUNTER — Ambulatory Visit (INDEPENDENT_AMBULATORY_CARE_PROVIDER_SITE_OTHER): Payer: Medicare Other | Admitting: Family Medicine

## 2015-05-03 VITALS — BP 134/82 | HR 68 | Temp 97.0°F | Ht 60.0 in | Wt 160.0 lb

## 2015-05-03 DIAGNOSIS — R300 Dysuria: Secondary | ICD-10-CM

## 2015-05-03 DIAGNOSIS — R3989 Other symptoms and signs involving the genitourinary system: Secondary | ICD-10-CM

## 2015-05-03 DIAGNOSIS — N390 Urinary tract infection, site not specified: Secondary | ICD-10-CM

## 2015-05-03 LAB — POCT URINALYSIS DIPSTICK
Bilirubin, UA: NEGATIVE
Blood, UA: NEGATIVE
Glucose, UA: NEGATIVE
Ketones, UA: NEGATIVE
Nitrite, UA: NEGATIVE
Spec Grav, UA: 1.01
Urobilinogen, UA: NEGATIVE
pH, UA: 6

## 2015-05-03 LAB — POCT UA - MICROSCOPIC ONLY
Casts, Ur, LPF, POC: NEGATIVE
Crystals, Ur, HPF, POC: NEGATIVE
Mucus, UA: NEGATIVE
Yeast, UA: NEGATIVE

## 2015-05-03 MED ORDER — CIPROFLOXACIN HCL 500 MG PO TABS: 500.0000 mg | ORAL_TABLET | Freq: Two times a day (BID) | ORAL | Status: DC

## 2015-05-03 NOTE — Patient Instructions (Signed)
Drink plenty of fluids Take antibiotic as directed twice daily for 7 days We will do a culture and sensitivity to make sure that we have chosen the correct antibiotic and we will call you let you know if we have You should recheck the urinalysis in 10-14 days to make sure that the urinary tract infection is completely cleared

## 2015-05-03 NOTE — Progress Notes (Signed)
Subjective:    Patient ID: [name], female    DOB: [birth year], 79 y.o.   MRN: [id number]  HPI Patient here today for possible uti. Patient has suprapubic pressure and fullness for several days and some low back discomfort.     Patient Active Problem List   Diagnosis Date Noted  . Diarrhea 11/30/2014  . Heme + stool 11/30/2014  . Chronic anticoagulation 11/30/2014  . CHF (congestive heart failure) 04/30/2014  . 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 Osteoporosis 01/27/2014  . Fibromyalgia 08/09/2013  . Asthma 08/01/2010  . Hypothyroidism 03/20/2010  . DEPRESSION 03/20/2010  . HTN (hypertension) 03/20/2010  . ESOPHAGEAL MOTILITY DISORDER 03/20/2010  . Hyperlipidemia 04/22/2009  . Cor athrscl-uns vessel 04/22/2009  . ATRIAL FIBRILLATION, PAROXYSMAL 04/22/2009  . ESOPHAGEAL STRICTURE 10/31/2004  . GERD 10/31/2004  . HIATAL HERNIA 10/06/2001   Outpatient Encounter Prescriptions as of 05/03/2015  Medication Sig  . acetaminophen (TYLENOL) 325 MG tablet Take 2 tablets (650 mg total) by mouth every 6 (six) hours as needed for mild pain or moderate pain.  Marland Kitchen albuterol (PROVENTIL HFA;VENTOLIN HFA) 108 (90 BASE) MCG/ACT inhaler Inhale into the lungs every 6 (six) hours as needed for wheezing or shortness of breath.  . ALLERGY RELIEF 10 MG tablet TAKE 1 TABLET ONCE A DAY  . ALPRAZolam (XANAX) 0.5 MG tablet TAKE 2 TABLETS AT BEDTIME  . Ascorbic Acid (VITAMIN C PO) Take 1 tablet by mouth every morning.  . benzonatate (TESSALON) 200 MG capsule Take 1 capsule (200 mg total) by mouth 4 (four) times daily as needed for cough.  . budesonide (PULMICORT) 0.5 MG/2ML nebulizer solution Take 2 mLs (0.5 mg total) by nebulization 2 (two) times daily. Dx 496  . Calcium Carbonate-Vitamin D (CALCIUM 600+D) 600-400 MG-UNIT per tablet Take 1 tablet by mouth daily at 6 PM.   . cholecalciferol (VITAMIN D) 1000 UNITS tablet Take 1,000 Units by mouth 2 (two) times daily with breakfast and lunch.     . CRESTOR 20 MG tablet TAKE 1 TABLET ONCE A DAY  . denosumab (PROLIA) 60 MG/ML SOLN injection Inject 60 mg into the skin every 6 (six) months. Administer in upper arm, thigh, or abdomen  . dextromethorphan-guaiFENesin (MUCINEX DM) 30-600 MG per 12 hr tablet Take 1 tablet by mouth 2 (two) times daily as needed. scheduled  . dicyclomine (BENTYL) 10 MG capsule TAKE (1) OR (2) CAPSULES EVERY SIX HOURS AS NEEDED FOR SPASM  . diltiazem (CARDIZEM CD) 120 MG 24 hr capsule Take 1 capsule (120 mg total) by mouth daily.  . fluticasone (FLONASE) 50 MCG/ACT nasal spray 2 SPRAYS IN EACH NOSTRIL ONCE A DAY  . HYDROcodone-acetaminophen (NORCO/VICODIN) 5-325 MG per tablet Take 1 tablet by mouth every 6 (six) hours as needed for moderate pain.  Marland Kitchen levothyroxine (SYNTHROID, LEVOTHROID) 25 MCG tablet Take 50 mcg by mouth daily before breakfast.  . loratadine (CLARITIN) 10 MG tablet Take 10 mg by mouth daily.   . meloxicam (MOBIC) 7.5 MG tablet Take 1 tablet (7.5 mg total) by mouth daily.  . methylPREDNIsolone (MEDROL DOSPACK) 4 MG tablet follow package directions  . metoprolol tartrate (LOPRESSOR) 25 MG tablet Take 1 tablet (25 mg total) by mouth 2 (two) times daily.  . montelukast (SINGULAIR) 10 MG tablet TAKE 1 TABLET ONCE A DAY  . Multiple Vitamin (MULTIVITAMIN WITH MINERALS) TABS Take 1 tablet by mouth daily.  . nitroGLYCERIN (NITROSTAT) 0.4 MG SL tablet Place 1 tablet (0.4 mg total) under the tongue every 5 (  five) minutes as needed for chest pain.  Marland Kitchen omega-3 acid ethyl esters (LOVAZA) 1 G capsule TAKE (2) CAPSULES TWICE DAILY.  Marland Kitchen omeprazole-sodium bicarbonate (ZEGERID) 40-1100 MG per capsule TAKE (1) CAPSULE DAILY  . ondansetron (ZOFRAN) 4 MG tablet TAKE 1 TABLET EVERY 12 HOURS AS NEEDED FOR NAUSEA  . PROAIR HFA 108 (90 BASE) MCG/ACT inhaler USE 2 PUFFS EVERY 6 HOURS AS NEEDED FOR WHEEZING  . sertraline (ZOLOFT) 100 MG tablet TAKE 1 TABLET ONCE A DAY  . traMADol (ULTRAM) 50 MG tablet Take 1 tablet (50 mg  total) by mouth every 6 (six) hours as needed for moderate pain or severe pain.  Marland Kitchen XARELTO 20 MG TABS tablet TAKE 1 TABLET ONCE A DAY   No facility-administered encounter medications on file as of 05/03/2015.      Review of Systems  Constitutional: Negative.   HENT: Negative.   Eyes: Negative.   Respiratory: Negative.   Cardiovascular: Negative.   Gastrointestinal: Negative.   Endocrine: Negative.   Genitourinary: Positive for dysuria.       Dark colored urine  Musculoskeletal: Negative.   Skin: Negative.   Allergic/Immunologic: Negative.   Neurological: Negative.   Hematological: Negative.   Psychiatric/Behavioral: Negative.        Objective:   Physical Exam  Constitutional: She is oriented to person, place, and time. She appears well-developed and well-nourished.  Abdominal: Soft. There is tenderness. There is no rebound and no guarding.  Suprapubic and low back tenderness  Neurological: She is alert and oriented to person, place, and time.  Skin: Skin is warm and dry. No rash noted.  Psychiatric: She has a normal mood and affect. Her behavior is normal. Judgment and thought content normal.   BP 134/82 mmHg  Pulse 68  Temp(Src) 97 F (36.1 C) (Oral)  Ht 5' (1.524 m)  Wt 160 lb (72.576 kg)  BMI 31.25 kg/m2  Results for orders placed or performed in visit on 05/03/15  POCT UA - Microscopic Only  Result Value Ref Range   WBC, Ur, HPF, POC 5-8    RBC, urine, microscopic 1-3    Bacteria, U Microscopic occ    Mucus, UA negative    Epithelial cells, urine per micros occ    Crystals, Ur, HPF, POC negative    Casts, Ur, LPF, POC negative    Yeast, UA negative   POCT urinalysis dipstick  Result Value Ref Range   Color, UA gold    Clarity, UA clear    Glucose, UA negative    Bilirubin, UA negative    Ketones, UA negative    Spec Grav, UA 1.010    Blood, UA negative    pH, UA 6.0    Protein, UA trace    Urobilinogen, UA negative    Nitrite, UA negative     Leukocytes, UA large (3+)          Assessment & Plan:  1. Dysuria -Drink plenty of fluids and take antibiotic as directed - POCT UA - Microscopic Only - POCT urinalysis dipstick - Urine culture  2. Abnormal urine color -Drink plenty of fluids including water intake antibiotic as directed  3. UTI (lower urinary tract infection) -Take Cipro twice daily for 7 days and recheck urinalysis and 10-14 days  Patient Instructions  Drink plenty of fluids Take antibiotic as directed twice daily for 7 days We will do a culture and sensitivity to make sure that we have chosen the correct antibiotic and we will call you  let you know if we have You should recheck the urinalysis in 10-14 days to make sure that the urinary tract infection is completely cleared   Arrie Senate MD

## 2015-05-05 DIAGNOSIS — S93601D Unspecified sprain of right foot, subsequent encounter: Secondary | ICD-10-CM | POA: Diagnosis not present

## 2015-05-05 LAB — URINE CULTURE

## 2015-05-08 ENCOUNTER — Telehealth: Payer: Self-pay

## 2015-05-08 DIAGNOSIS — S93601A Unspecified sprain of right foot, initial encounter: Secondary | ICD-10-CM

## 2015-05-08 MED ORDER — HYDROCODONE-ACETAMINOPHEN 5-325 MG PO TABS: 1.0000 | ORAL_TABLET | Freq: Four times a day (QID) | ORAL | Status: DC | PRN

## 2015-05-08 NOTE — Telephone Encounter (Signed)
You may refill this medication 1 and make sure that it is only used at nighttime if needed

## 2015-05-10 ENCOUNTER — Other Ambulatory Visit (INDEPENDENT_AMBULATORY_CARE_PROVIDER_SITE_OTHER): Payer: Medicare Other

## 2015-05-10 DIAGNOSIS — N390 Urinary tract infection, site not specified: Secondary | ICD-10-CM

## 2015-05-10 LAB — POCT URINALYSIS DIPSTICK
Bilirubin, UA: NEGATIVE
Blood, UA: NEGATIVE
Glucose, UA: NEGATIVE
Ketones, UA: NEGATIVE
Leukocytes, UA: NEGATIVE
Nitrite, UA: NEGATIVE
Protein, UA: NEGATIVE
Spec Grav, UA: 1.015
Urobilinogen, UA: NEGATIVE
pH, UA: 5

## 2015-05-10 LAB — POCT UA - MICROSCOPIC ONLY
Casts, Ur, LPF, POC: NEGATIVE
Crystals, Ur, HPF, POC: NEGATIVE
Mucus, UA: NEGATIVE
RBC, urine, microscopic: NEGATIVE
WBC, Ur, HPF, POC: NEGATIVE
Yeast, UA: NEGATIVE

## 2015-05-10 NOTE — Progress Notes (Signed)
Lab only 

## 2015-05-23 DIAGNOSIS — J449 Chronic obstructive pulmonary disease, unspecified: Secondary | ICD-10-CM | POA: Diagnosis not present

## 2015-05-24 DIAGNOSIS — H3531 Nonexudative age-related macular degeneration: Secondary | ICD-10-CM | POA: Diagnosis not present

## 2015-05-24 DIAGNOSIS — H25813 Combined forms of age-related cataract, bilateral: Secondary | ICD-10-CM | POA: Diagnosis not present

## 2015-05-24 DIAGNOSIS — H47323 Drusen of optic disc, bilateral: Secondary | ICD-10-CM | POA: Diagnosis not present

## 2015-05-27 ENCOUNTER — Other Ambulatory Visit: Payer: Self-pay | Admitting: Cardiology

## 2015-05-29 NOTE — Telephone Encounter (Signed)
Rx(s) sent to pharmacy electronically.  

## 2015-06-09 ENCOUNTER — Encounter: Payer: Self-pay | Admitting: Family Medicine

## 2015-06-09 ENCOUNTER — Ambulatory Visit (INDEPENDENT_AMBULATORY_CARE_PROVIDER_SITE_OTHER): Payer: Medicare Other | Admitting: Family Medicine

## 2015-06-09 VITALS — BP 136/91 | HR 69 | Temp 97.5°F | Ht 60.0 in | Wt 159.0 lb

## 2015-06-09 DIAGNOSIS — M25511 Pain in right shoulder: Secondary | ICD-10-CM

## 2015-06-09 DIAGNOSIS — E039 Hypothyroidism, unspecified: Secondary | ICD-10-CM

## 2015-06-09 DIAGNOSIS — E785 Hyperlipidemia, unspecified: Secondary | ICD-10-CM | POA: Diagnosis not present

## 2015-06-09 DIAGNOSIS — I480 Paroxysmal atrial fibrillation: Secondary | ICD-10-CM

## 2015-06-09 DIAGNOSIS — M79604 Pain in right leg: Secondary | ICD-10-CM

## 2015-06-09 DIAGNOSIS — I10 Essential (primary) hypertension: Secondary | ICD-10-CM | POA: Diagnosis not present

## 2015-06-09 DIAGNOSIS — E559 Vitamin D deficiency, unspecified: Secondary | ICD-10-CM

## 2015-06-09 DIAGNOSIS — K219 Gastro-esophageal reflux disease without esophagitis: Secondary | ICD-10-CM

## 2015-06-09 LAB — POCT CBC
Granulocyte percent: 48.3 %G (ref 37–80)
HCT, POC: 41.1 % (ref 37.7–47.9)
Hemoglobin: 12.7 g/dL (ref 12.2–16.2)
Lymph, poc: 2.9 (ref 0.6–3.4)
MCH, POC: 28.5 pg (ref 27–31.2)
MCHC: 30.8 g/dL — AB (ref 31.8–35.4)
MCV: 92.5 fL (ref 80–97)
MPV: 6.3 fL (ref 0–99.8)
POC Granulocyte: 3.3 (ref 2–6.9)
POC LYMPH PERCENT: 42.7 %L (ref 10–50)
Platelet Count, POC: 407 10*3/uL (ref 142–424)
RBC: 4.44 M/uL (ref 4.04–5.48)
RDW, POC: 13.1 %
WBC: 6.9 10*3/uL (ref 4.6–10.2)

## 2015-06-09 LAB — LIPID PANEL
HDL: 62 mg/dL (ref 35–70)
LDL Cholesterol: 58 mg/dL
Triglycerides: 107 mg/dL (ref 40–160)

## 2015-06-09 MED ORDER — ALPRAZOLAM 0.5 MG PO TABS: 1.0000 mg | ORAL_TABLET | Freq: Every day | ORAL | Status: DC

## 2015-06-09 NOTE — Patient Instructions (Addendum)
Medicare Annual Wellness Visit  [facility] and the medical providers at [facility] strive to bring you the best medical care.  In doing so we not only want to address your current medical conditions and concerns but also to detect new conditions early and prevent illness, disease and health-related problems.    Medicare offers a yearly Wellness Visit which allows our clinical staff to assess your need for preventative services including immunizations, lifestyle education, counseling to decrease risk of preventable diseases and screening for fall risk and other medical concerns.    This visit is provided free of charge (no copay) for all Medicare recipients. The clinical pharmacists at [facility] have begun to conduct these Wellness Visits which will also include a thorough review of all your medications.    As you primary medical provider recommend that you make an appointment for your Annual Wellness Visit if you have not done so already this year.  You may set up this appointment before you leave today or you may call back ([phone]) and schedule an appointment.  Please make sure when you call that you mention that you are scheduling your Annual Wellness Visit with the clinical pharmacist so that the appointment may be made for the proper length of time.     Continue current medications. Continue good therapeutic lifestyle changes which include good diet and exercise. Fall precautions discussed with patient. If an FOBT was given today- please return it to our front desk. If you are over 66 years old - you may need Prevnar 75 or the adult Pneumonia vaccine.  Flu Shots are still available at our office. If you still haven't had one please call to set up a nurse visit to get one.   After your visit with [place] today you will receive a survey in the mail or online from [employer] regarding your care with [place]. Please take a moment to  fill this out. Your feedback is very important to [place] as you can help [place] better understand your patient needs as well as improve your experience and satisfaction. WE CARE ABOUT YOU!!!   The patient should follow-up regularly with her cardiologist every 6-12 months and her orthopedic surgeon. She should continue to be careful and did not put yourself at risk for falling She should stay as active as possible physically

## 2015-06-09 NOTE — Progress Notes (Signed)
Subjective:    Patient ID: [name], female    DOB: Mar 13, 1936, 79 y.o.   MRN: [id number]  HPI Pt here for follow up and management of chronic medical problems which includes hypertension, hyperlipidemia, and hypothyroid. She is taking medications regularly. Patient takes a lot of medications and is followed regularly by the orthopedist and the cardiologist. She has paroxysmal atrial fibrillation, she continues to have problems with her foot and ankle. Chest fibromyalgia asthma and hypothyroidism. She also has gastroesophageal reflux disease. The patient's biggest complaint today is her fatigue. She feels like this is partially due to the fact that she is not able to walk a lot and is not getting enough exercise and when she does try to exert herself she gets very tired and feels fatigued. She is currently still seeing orthopedic surgeon regarding her right shoulder past rotator cuff repair and inability to use this arm well and see another orthopedist regarding her right ankle deformity and pain. She is planning to seek a second opinion regarding her right ankle pain. Her GI reflux is controlled with Zegerid. She denies chest pain or shortness of breath and has not seen any blood in the stool is not currently having any problems with passing her water.      Patient Active Problem List   Diagnosis Date Noted  . Diarrhea 11/30/2014  . Heme + stool 11/30/2014  . Chronic anticoagulation 11/30/2014  . CHF (congestive heart failure) 04/30/2014  . 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 Osteoporosis 01/27/2014  . Fibromyalgia 08/09/2013  . Asthma 08/01/2010  . Hypothyroidism 03/20/2010  . DEPRESSION 03/20/2010  . HTN (hypertension) 03/20/2010  . ESOPHAGEAL MOTILITY DISORDER 03/20/2010  . Hyperlipidemia 04/22/2009  . Cor athrscl-uns vessel 04/22/2009  . ATRIAL FIBRILLATION, PAROXYSMAL 04/22/2009  . ESOPHAGEAL STRICTURE 10/31/2004  . GERD 10/31/2004  . HIATAL HERNIA 10/06/2001    Outpatient Encounter Prescriptions as of 06/09/2015  Medication Sig  . acetaminophen (TYLENOL) 325 MG tablet Take 2 tablets (650 mg total) by mouth every 6 (six) hours as needed for mild pain or moderate pain.  Marland Kitchen albuterol (PROVENTIL HFA;VENTOLIN HFA) 108 (90 BASE) MCG/ACT inhaler Inhale into the lungs every 6 (six) hours as needed for wheezing or shortness of breath.  . ALPRAZolam (XANAX) 0.5 MG tablet TAKE 2 TABLETS AT BEDTIME  . Ascorbic Acid (VITAMIN C PO) Take 1 tablet by mouth every morning.  . budesonide (PULMICORT) 0.5 MG/2ML nebulizer solution Take 2 mLs (0.5 mg total) by nebulization 2 (two) times daily. Dx 496  . Calcium Carbonate-Vitamin D (CALCIUM 600+D) 600-400 MG-UNIT per tablet Take 1 tablet by mouth daily at 6 PM.   . cholecalciferol (VITAMIN D) 1000 UNITS tablet Take 1,000 Units by mouth 2 (two) times daily with breakfast and lunch.   . CRESTOR 20 MG tablet TAKE 1 TABLET ONCE A DAY  . denosumab (PROLIA) 60 MG/ML SOLN injection Inject 60 mg into the skin every 6 (six) months. Administer in upper arm, thigh, or abdomen  . dextromethorphan-guaiFENesin (MUCINEX DM) 30-600 MG per 12 hr tablet Take 1 tablet by mouth 2 (two) times daily as needed. scheduled  . dicyclomine (BENTYL) 10 MG capsule TAKE (1) OR (2) CAPSULES EVERY SIX HOURS AS NEEDED FOR SPASM  . diltiazem (CARDIZEM CD) 120 MG 24 hr capsule Take 1 capsule (120 mg total) by mouth daily.  . fluticasone (FLONASE) 50 MCG/ACT nasal spray 2 SPRAYS IN EACH NOSTRIL ONCE A DAY  . HYDROcodone-acetaminophen (NORCO/VICODIN) 5-325 MG per tablet Take  1 tablet by mouth every 6 (six) hours as needed for moderate pain.  Marland Kitchen levothyroxine (SYNTHROID, LEVOTHROID) 25 MCG tablet Take 50 mcg by mouth daily before breakfast.  . loratadine (CLARITIN) 10 MG tablet Take 10 mg by mouth daily.   . metoprolol tartrate (LOPRESSOR) 25 MG tablet Take 1 tablet (25 mg total) by mouth 2 (two) times daily.  . montelukast (SINGULAIR) 10 MG tablet TAKE 1 TABLET  ONCE A DAY  . Multiple Vitamin (MULTIVITAMIN WITH MINERALS) TABS Take 1 tablet by mouth daily.  . nitroGLYCERIN (NITROSTAT) 0.4 MG SL tablet Place 1 tablet (0.4 mg total) under the tongue every 5 (five) minutes as needed for chest pain.  Marland Kitchen omega-3 acid ethyl esters (LOVAZA) 1 G capsule TAKE (2) CAPSULES TWICE DAILY.  Marland Kitchen omeprazole-sodium bicarbonate (ZEGERID) 40-1100 MG per capsule TAKE (1) CAPSULE DAILY  . ondansetron (ZOFRAN) 4 MG tablet TAKE 1 TABLET EVERY 12 HOURS AS NEEDED FOR NAUSEA  . PROAIR HFA 108 (90 BASE) MCG/ACT inhaler USE 2 PUFFS EVERY 6 HOURS AS NEEDED FOR WHEEZING  . sertraline (ZOLOFT) 100 MG tablet TAKE 1 TABLET ONCE A DAY  . traMADol (ULTRAM) 50 MG tablet Take 1 tablet (50 mg total) by mouth every 6 (six) hours as needed for moderate pain or severe pain.  Marland Kitchen XARELTO 20 MG TABS tablet TAKE 1 TABLET ONCE A DAY  . [DISCONTINUED] benzonatate (TESSALON) 200 MG capsule Take 1 capsule (200 mg total) by mouth 4 (four) times daily as needed for cough.  . [DISCONTINUED] ciprofloxacin (CIPRO) 500 MG tablet Take 1 tablet (500 mg total) by mouth 2 (two) times daily.  . [DISCONTINUED] methylPREDNIsolone (MEDROL DOSPACK) 4 MG tablet follow package directions   No facility-administered encounter medications on file as of 06/09/2015.      Review of Systems  Constitutional: Positive for fatigue.  HENT: Negative.   Eyes: Negative.   Respiratory: Negative.   Cardiovascular: Negative.   Gastrointestinal: Negative.   Endocrine: Negative.   Genitourinary: Negative.   Musculoskeletal: Negative.   Skin: Negative.   Allergic/Immunologic: Negative.   Neurological: Negative.   Hematological: Negative.   Psychiatric/Behavioral: Negative.        Objective:   Physical Exam  Constitutional: She is oriented to person, place, and time. She appears well-developed and well-nourished. No distress.  HENT:  Head: Normocephalic and atraumatic.  Right Ear: External ear normal.  Left Ear: External  ear normal.  Nose: Nose normal.  Mouth/Throat: Oropharynx is clear and moist.  Eyes: Conjunctivae and EOM are normal. Pupils are equal, round, and reactive to light. Right eye exhibits no discharge. Left eye exhibits no discharge. No scleral icterus.  Neck: Normal range of motion. Neck supple. No thyromegaly present.  Cardiovascular: Normal rate, regular rhythm, normal heart sounds and intact distal pulses.   No murmur heard. Rhythm today is regular at about 72/m.  Pulmonary/Chest: Effort normal and breath sounds normal. No respiratory distress. She has no wheezes. She has no rales. She exhibits no tenderness.  Lungs are clear anteriorly and posteriorly without wheezing.  Abdominal: Soft. Bowel sounds are normal. She exhibits no mass. There is no tenderness. There is no rebound and no guarding.  No abdominal bruits  Musculoskeletal: She exhibits no edema or tenderness.  She has a deformity of the right lower extremity foot and ankle secondary to injury and subsequent multiple surgeries. Range of motion is limited because of this. She also has had a history of bilateral rotator cuff repairs and has limited range of motion  of both upper extremities.  Lymphadenopathy:    She has no cervical adenopathy.  Neurological: She is alert and oriented to person, place, and time. She has normal reflexes. No cranial nerve deficit.  Skin: Skin is warm and dry. No rash noted.  Psychiatric: She has a normal mood and affect. Her behavior is normal. Judgment and thought content normal.  Nursing note and vitals reviewed.  BP 136/91 mmHg  Pulse 69  Temp(Src) 97.5 F (36.4 C) (Oral)  Ht 5' (1.524 m)  Wt 159 lb (72.122 kg)  BMI 31.05 kg/m2        Assessment & Plan:  1. Hyperlipidemia -She should continue with current treatment pending results of lab work being done today. - POCT CBC - NMR, lipoprofile  2. Essential hypertension -The diastolic blood pressure is slightly elevated today but we will not  make any changes in therapy. - POCT CBC - BMP8+EGFR - Hepatic function panel  3. Gastroesophageal reflux disease, esophagitis presence not specified -The patient's GERD is well controlled with Zegerid - POCT CBC - Hepatic function panel  4. Hypothyroidism, unspecified hypothyroidism type -She is currently not taking any thyroid medication and we will recheck this to make sure that this is not playing a role with her fatigue - POCT CBC - Thyroid Panel With TSH  5. Vitamin D deficiency -Continue vitamin D treatment pending results of lab work - POCT CBC - Vit D  25 hydroxy (rtn osteoporosis monitoring)  6. Paroxysmal atrial fibrillation -Patient had a regular rate and rhythm today at 72/m. - POCT CBC  7. Right shoulder pain -She is still seeing the orthopedic surgeon regarding her rotator cuff problems  8. Pain of right lower extremity -She has plans to see a new orthopedic surgeon for another opinion regarding her ankle pain  Meds ordered this encounter  Medications  . ALPRAZolam (XANAX) 0.5 MG tablet    Sig: Take 2 tablets (1 mg total) by mouth at bedtime.    Dispense:  60 tablet    Refill:  2   Patient Instructions                       Medicare Annual Wellness Visit  [facility] and the medical providers at [facility] strive to bring you the best medical care.  In doing so we not only want to address your current medical conditions and concerns but also to detect new conditions early and prevent illness, disease and health-related problems.    Medicare offers a yearly Wellness Visit which allows our clinical staff to assess your need for preventative services including immunizations, lifestyle education, counseling to decrease risk of preventable diseases and screening for fall risk and other medical concerns.    This visit is provided free of charge (no copay) for all Medicare recipients. The clinical pharmacists at [facility] have begun to conduct these Wellness Visits which will also include a thorough review of all your medications.    As you primary medical provider recommend that you make an appointment for your Annual Wellness Visit if you have not done so already this year.  You may set up this appointment before you leave today or you may call back ([phone]) and schedule an appointment.  Please make sure when you call that you mention that you are scheduling your Annual Wellness Visit with the clinical pharmacist so that the appointment may be made for the proper length of time.  Continue current medications. Continue good therapeutic lifestyle changes which include good diet and exercise. Fall precautions discussed with patient. If an FOBT was given today- please return it to our front desk. If you are over 61 years old - you may need Prevnar 64 or the adult Pneumonia vaccine.  Flu Shots are still available at our office. If you still haven't had one please call to set up a nurse visit to get one.   After your visit with [place] today you will receive a survey in the mail or online from [employer] regarding your care with [place]. Please take a moment to fill this out. Your feedback is very important to [place] as you can help [place] better understand your patient needs as well as improve your experience and satisfaction. WE CARE ABOUT YOU!!!   The patient should follow-up regularly with her cardiologist every 6-12 months and her orthopedic surgeon. She should continue to be careful and did not put yourself at risk for falling She should stay as active as possible physically   Arrie Senate MD

## 2015-06-10 LAB — THYROID PANEL WITH TSH
Free Thyroxine Index: 1.7 (ref 1.2–4.9)
T3 Uptake Ratio: 26 % (ref 24–39)
T4, Total: 6.5 ug/dL (ref 4.5–12.0)
TSH: 1.8 u[IU]/mL (ref 0.450–4.500)

## 2015-06-10 LAB — BMP8+EGFR
BUN/Creatinine Ratio: 14 (ref 11–26)
BUN: 9 mg/dL (ref 8–27)
CO2: 24 mmol/L (ref 18–29)
Calcium: 9.4 mg/dL (ref 8.7–10.3)
Chloride: 98 mmol/L (ref 97–108)
Creatinine, Ser: 0.65 mg/dL (ref 0.57–1.00)
GFR calc Af Amer: 99 mL/min/{1.73_m2} (ref >59)
GFR calc non Af Amer: 86 mL/min/{1.73_m2} (ref >59)
Glucose: 92 mg/dL (ref 65–99)
Potassium: 4.5 mmol/L (ref 3.5–5.2)
Sodium: 138 mmol/L (ref 134–144)

## 2015-06-10 LAB — NMR, LIPOPROFILE
Cholesterol: 141 mg/dL (ref 100–199)
HDL Cholesterol by NMR: 62 mg/dL (ref >39)
HDL Particle Number: 37.8 umol/L (ref >=30.5)
LDL Particle Number: 846 nmol/L (ref <1000)
LDL Size: 20.6 nm (ref >20.5)
LDL-C: 58 mg/dL (ref 0–99)
LP-IR Score: 41 (ref <=45)
Small LDL Particle Number: 538 nmol/L — ABNORMAL HIGH (ref <=527)
Triglycerides by NMR: 107 mg/dL (ref 0–149)

## 2015-06-10 LAB — HEPATIC FUNCTION PANEL
ALT: 18 IU/L (ref 0–32)
AST: 24 IU/L (ref 0–40)
Albumin: 4.6 g/dL (ref 3.5–4.8)
Alkaline Phosphatase: 77 IU/L (ref 39–117)
Bilirubin Total: 0.5 mg/dL (ref 0.0–1.2)
Bilirubin, Direct: 0.13 mg/dL (ref 0.00–0.40)
Total Protein: 7.5 g/dL (ref 6.0–8.5)

## 2015-06-10 LAB — VITAMIN D 25 HYDROXY (VIT D DEFICIENCY, FRACTURES): Vit D, 25-Hydroxy: 48.7 ng/mL (ref 30.0–100.0)

## 2015-06-12 ENCOUNTER — Telehealth: Payer: Self-pay | Admitting: Family Medicine

## 2015-06-12 DIAGNOSIS — S93601A Unspecified sprain of right foot, initial encounter: Secondary | ICD-10-CM

## 2015-06-13 ENCOUNTER — Other Ambulatory Visit: Payer: Self-pay | Admitting: *Deleted

## 2015-06-13 DIAGNOSIS — S93601A Unspecified sprain of right foot, initial encounter: Secondary | ICD-10-CM

## 2015-06-13 MED ORDER — HYDROCODONE-ACETAMINOPHEN 5-325 MG PO TABS: 1.0000 | ORAL_TABLET | Freq: Four times a day (QID) | ORAL | Status: DC | PRN

## 2015-06-13 NOTE — Telephone Encounter (Signed)
Aware [facility] is a possibility and rx is ready

## 2015-06-13 NOTE — Telephone Encounter (Signed)
Patient states that she has had foot surgery 3 times and wants to know if you could recommend a good foot dr for her to go to for her right foot. Patient also needs a refill on her hydrocodone. Last filled [id number]. Please advise and route back to pool A

## 2015-06-13 NOTE — Telephone Encounter (Signed)
Hydrocodone may be refilled and check with [facility] regarding a foot orthopedic doctor that she may can see that can help her

## 2015-06-15 ENCOUNTER — Telehealth: Payer: Self-pay | Admitting: Pulmonary Disease

## 2015-06-15 MED ORDER — AZITHROMYCIN 250 MG PO TABS: ORAL_TABLET | ORAL | Status: DC

## 2015-06-15 NOTE — Telephone Encounter (Signed)
Spoke with pt. Reports increased coughing and wheezing. Cough is producing green mucus. Denies chest tightness, SOB or fever. Offered appointment today in [place] with RA, she very rudely declined. Stated," Just call me something in!"  RA - please advise. Thanks.

## 2015-06-15 NOTE — Telephone Encounter (Signed)
z-pak 

## 2015-06-15 NOTE — Telephone Encounter (Signed)
Called pt and is aware of recs. RX sent in. Nothing further needed 

## 2015-06-16 DIAGNOSIS — J449 Chronic obstructive pulmonary disease, unspecified: Secondary | ICD-10-CM | POA: Diagnosis not present

## 2015-06-26 ENCOUNTER — Other Ambulatory Visit: Payer: Self-pay | Admitting: Family Medicine

## 2015-06-26 ENCOUNTER — Other Ambulatory Visit: Payer: Self-pay | Admitting: Family

## 2015-06-26 ENCOUNTER — Other Ambulatory Visit: Payer: Self-pay | Admitting: Internal Medicine

## 2015-06-26 DIAGNOSIS — H2512 Age-related nuclear cataract, left eye: Secondary | ICD-10-CM | POA: Diagnosis not present

## 2015-06-26 DIAGNOSIS — H25812 Combined forms of age-related cataract, left eye: Secondary | ICD-10-CM | POA: Diagnosis not present

## 2015-06-27 NOTE — Telephone Encounter (Signed)
Refill called to [facility] 

## 2015-06-27 NOTE — Telephone Encounter (Signed)
Last seen 06/09/15 DWM  If approved route to nurse to call into [facility]

## 2015-07-03 ENCOUNTER — Ambulatory Visit: Payer: Medicare Other | Admitting: Adult Health

## 2015-07-07 ENCOUNTER — Telehealth: Payer: Self-pay | Admitting: Pharmacist

## 2015-07-07 NOTE — Telephone Encounter (Signed)
Patient wanted to know if it would be ok to try glucosamine and chondrointin.   Reviewed medication list.   I did advise that she give it at least 3 months to see effects.

## 2015-07-11 ENCOUNTER — Encounter: Payer: Self-pay | Admitting: Adult Health

## 2015-07-11 ENCOUNTER — Ambulatory Visit (INDEPENDENT_AMBULATORY_CARE_PROVIDER_SITE_OTHER): Payer: Medicare Other | Admitting: Adult Health

## 2015-07-11 VITALS — BP 128/78 | HR 60 | Temp 98.1°F | Ht 60.0 in | Wt 159.0 lb

## 2015-07-11 DIAGNOSIS — J4531 Mild persistent asthma with (acute) exacerbation: Secondary | ICD-10-CM

## 2015-07-11 MED ORDER — HYDROCODONE-HOMATROPINE 5-1.5 MG/5ML PO SYRP: 5.0000 mL | ORAL_SOLUTION | Freq: Four times a day (QID) | ORAL | Status: DC | PRN

## 2015-07-11 MED ORDER — PREDNISONE 10 MG PO TABS: ORAL_TABLET | ORAL | Status: DC

## 2015-07-11 NOTE — Progress Notes (Signed)
   Subjective:    Patient ID: [name], female    DOB: 03-Aug-1936, 79 y.o.   MRN: [id number]  HPI  78/F remote ex smoker with reactive airway disease - ? GERD vs sinusitis, nml pFTs  She smoked a PPD x 20-25 yrs before quitting in 1990. She develops recurrent chest colds requiring several rounds of Abx to get better.    Significant tests/ events  SHe underwent esophageal dilation for hypertensive LES (Dr Olevia Perches) & is on protonix two times a day . Evaluation for chest pain in 05/2009 (Dr Percival Spanish) -no obstructive CAD - attributed to esophageal spasm.Sucralfate added by GI to zegerid .  Took macrodantin x 2 yrs for UTIs , stopped 2010  Prednisone makes her hyper and nervous. Has osteoporosis with fractures.  PFTs nml '11 .  06/2013 Spirometry -no obstruction   RAST - IgE 225 -high but no sensitivity to common indoor/ outdoor allergens  Underwent endoscopic sinus surgery 1/12 by dr Constance Holster  Treated x 6 wks with clinda for chronic sinusitis, started wheezing again when ABx stopped    Admitted 04/2014  for acute hypoxic respiratory failure, secondary to pneumonia, asthma, exacerbation, congestive heart failure, and MRSA empyema requiring vats on May 19, complicated by an element of vocal cord dysfunction, and chronic cough. Patient did have upper airway wheezing, consistent with vocal cord dysfunction. ENT eval >> laryngoscopy bedside- consistent with diffuse inflammation of the vocal cords, and supraglottic larynx.     01/2015 cough + wheeze >> pred given, lovaza stopped, tessalon really helped 4 month follow up.  Reports breathing is doing well since last ov.  Does mention some recent cough and congestion but symptoms have improved.  Denies chest pain, shortness of breath , fever, orthopnea, PND or leg swelling Ambulates with cane Complains of raspy voice  07/11/2015 Follow up : RAD /Bronchitis -[facility]  Pt returns for 4 month follow up  Says she was doing well until  3  weeks ago.  Developed cough and congestion , minimally productive.  Called zpack . Got some better but still has lingering wheezing  Cough is worse at night. Would like cough syrup to help her rest.    Remains on Budesonide neb Twice daily   Denies chest pain, orthopnea, edema , fever or hemoptysis     Review of Systems neg for any significant sore throat, dysphagia, itching, sneezing, nasal congestion or excess/ purulent secretions, fever, chills, sweats, unintended wt loss, pleuritic or exertional cp, hempoptysis, orthopnea pnd or change in chronic leg swelling. Also denies presyncope, palpitations, heartburn, abdominal pain, nausea, vomiting, diarrhea or change in bowel or urinary habits, dysuria,hematuria, rash, arthralgias, visual complaints, headache, numbness weakness or ataxia.     Objective:   Physical Exam  Gen. Pleasant, well-nourished, in no distress ENT - no lesions, no post nasal drip Neck: No JVD, no thyromegaly, no carotid bruits Lungs: no use of accessory muscles, no dullness to percussion, clear without rales or rhonchi  Cardiovascular: Rhythm regular, heart sounds  normal, no murmurs or gallops, no peripheral edema Musculoskeletal: No deformities, no cyanosis or clubbing         Assessment & Plan:

## 2015-07-11 NOTE — Assessment & Plan Note (Signed)
Flare with slow to resolve bronchitis   Plan  prednisone taper over next week .  Mucinex Twice daily  As needed  Cough/congestion  Delsym 2 tsp Twice daily  As needed  Cough  May use Hydromet 1/2 -1 tsp every 8hr As needed  Cough, may make you sleepy.  Please contact office for sooner follow up if symptoms do not improve or worsen or seek emergency care   follow up Dr. Elsworth Soho  In 3-4 months and As needed

## 2015-07-11 NOTE — Patient Instructions (Addendum)
Prednisone taper over next week .  Mucinex Twice daily  As needed  Cough/congestion  Delsym 2 tsp Twice daily  As needed  Cough  May use Hydromet 1/2 -1 tsp every 8hr As needed  Cough, may make you sleepy.  Please contact office for sooner follow up if symptoms do not improve or worsen or seek emergency care   follow up Dr. Elsworth Soho  In 3-4 months and As needed

## 2015-07-12 NOTE — Progress Notes (Signed)
Reviewed & agree with plan  

## 2015-07-17 DIAGNOSIS — H2511 Age-related nuclear cataract, right eye: Secondary | ICD-10-CM | POA: Diagnosis not present

## 2015-07-18 ENCOUNTER — Telehealth: Payer: Self-pay | Admitting: Adult Health

## 2015-07-18 MED ORDER — AZITHROMYCIN 250 MG PO TABS: ORAL_TABLET | ORAL | Status: DC

## 2015-07-18 NOTE — Telephone Encounter (Signed)
Called spoke with pt. Aware of recs. Nothing further needed and RX sent in.

## 2015-07-18 NOTE — Telephone Encounter (Signed)
Spoke with the pt  She was last seen by TP 07/11/15 She states that she finally got her hydromet filled yesterday and now that she has started taking med her cough has become more prod and she is coughing up moderate amounts of green sputum  Her breathing is no better or worse and she denies any other new co's  No wheezing, CP, fever or other co's  She believes that she may need an abx  Please advise thanks! Allergies  Allergen Reactions  . Aspirin Other (See Comments)    REACTION: regular strength causes "heart to beat fast"  . Captopril Hypertension  . Naproxen Other (See Comments)    Tongue swelling  . Penicillins     Swelling around site  . Sulfonamide Derivatives Hives  . Clindamycin/Lincomycin

## 2015-07-18 NOTE — Telephone Encounter (Signed)
Did she take pred taper. ?  hydromet is only to help her rest with cough

## 2015-07-18 NOTE — Telephone Encounter (Signed)
Can have Zpack #1 tad  Has multiple drug allergies .  If not better will ov to recheck  Please contact office for sooner follow up if symptoms do not improve or worsen or seek emergency care

## 2015-07-18 NOTE — Telephone Encounter (Signed)
Yes she took the pred taper  Her concern is with the green sputum that she has started coughing up

## 2015-07-19 ENCOUNTER — Telehealth: Payer: Self-pay | Admitting: Pulmonary Disease

## 2015-07-19 MED ORDER — PREDNISONE 10 MG PO TABS: ORAL_TABLET | ORAL | Status: DC

## 2015-07-19 NOTE — Telephone Encounter (Signed)
Spoke with Frankey Poot, NP.  States she is just leaving a house call with pt.   Finished 2nd zpak- states pt c/o sob even at rest, wheezing.  Weight is stable. No prod cough.  Denies fever. Pt states she was improving while on prednisone but has began worsening since finishing pred taper.   Please call pt with recs, not Clarise Cruz.    RA please advise on recs.  Thanks!

## 2015-07-19 NOTE — Telephone Encounter (Signed)
Longer taper Prednisone 10 mg tabs  Take 2 tabs daily with food x 7ds, then 1 tab daily with food x 7ds then STOP

## 2015-07-19 NOTE — Telephone Encounter (Signed)
Informed pt recs from RA. Will send rx to pharmacy per RA. Nothing further needed.

## 2015-07-25 ENCOUNTER — Other Ambulatory Visit: Payer: Self-pay | Admitting: Cardiology

## 2015-07-25 ENCOUNTER — Other Ambulatory Visit: Payer: Self-pay | Admitting: Family Medicine

## 2015-07-25 ENCOUNTER — Other Ambulatory Visit: Payer: Self-pay | Admitting: Family

## 2015-07-25 DIAGNOSIS — J449 Chronic obstructive pulmonary disease, unspecified: Secondary | ICD-10-CM | POA: Diagnosis not present

## 2015-07-26 ENCOUNTER — Other Ambulatory Visit (INDEPENDENT_AMBULATORY_CARE_PROVIDER_SITE_OTHER): Payer: Medicare Other

## 2015-07-26 ENCOUNTER — Ambulatory Visit (INDEPENDENT_AMBULATORY_CARE_PROVIDER_SITE_OTHER): Payer: Medicare Other | Admitting: Pulmonary Disease

## 2015-07-26 ENCOUNTER — Ambulatory Visit (INDEPENDENT_AMBULATORY_CARE_PROVIDER_SITE_OTHER)
Admission: RE | Admit: 2015-07-26 | Discharge: 2015-07-26 | Disposition: A | Discharge status: Home / Self Care | Payer: Medicare Other | Source: Ambulatory Visit | Attending: Pulmonary Disease | Admitting: Pulmonary Disease

## 2015-07-26 ENCOUNTER — Encounter: Payer: Self-pay | Admitting: Pulmonary Disease

## 2015-07-26 ENCOUNTER — Other Ambulatory Visit: Payer: Medicare Other

## 2015-07-26 VITALS — BP 122/74 | HR 71 | Temp 97.1°F | Ht 60.0 in | Wt 163.4 lb

## 2015-07-26 DIAGNOSIS — R05 Cough: Secondary | ICD-10-CM

## 2015-07-26 DIAGNOSIS — R059 Cough, unspecified: Secondary | ICD-10-CM

## 2015-07-26 DIAGNOSIS — K219 Gastro-esophageal reflux disease without esophagitis: Secondary | ICD-10-CM

## 2015-07-26 DIAGNOSIS — J4531 Mild persistent asthma with (acute) exacerbation: Secondary | ICD-10-CM

## 2015-07-26 DIAGNOSIS — R0602 Shortness of breath: Secondary | ICD-10-CM | POA: Diagnosis not present

## 2015-07-26 LAB — CBC WITH DIFFERENTIAL/PLATELET
Basophils Absolute: 0 10*3/uL (ref 0.0–0.1)
Basophils Relative: 0.2 % (ref 0.0–3.0)
Eosinophils Absolute: 0.1 10*3/uL (ref 0.0–0.7)
Eosinophils Relative: 0.4 % (ref 0.0–5.0)
HCT: 38 % (ref 36.0–46.0)
Hemoglobin: 12.8 g/dL (ref 12.0–15.0)
Lymphocytes Relative: 15.3 % (ref 12.0–46.0)
Lymphs Abs: 2.6 10*3/uL (ref 0.7–4.0)
MCHC: 33.7 g/dL (ref 30.0–36.0)
MCV: 94.1 fl (ref 78.0–100.0)
Monocytes Absolute: 0.4 10*3/uL (ref 0.1–1.0)
Monocytes Relative: 2.6 % — ABNORMAL LOW (ref 3.0–12.0)
Neutro Abs: 13.6 10*3/uL — ABNORMAL HIGH (ref 1.4–7.7)
Neutrophils Relative %: 81.5 % — ABNORMAL HIGH (ref 43.0–77.0)
Platelets: 353 10*3/uL (ref 150.0–400.0)
RBC: 4.04 Mil/uL (ref 3.87–5.11)
RDW: 13.6 % (ref 11.5–15.5)
WBC: 16.7 10*3/uL — ABNORMAL HIGH (ref 4.0–10.5)

## 2015-07-26 MED ORDER — DOXYCYCLINE HYCLATE 100 MG PO TABS: 100.0000 mg | ORAL_TABLET | Freq: Two times a day (BID) | ORAL | Status: DC

## 2015-07-26 MED ORDER — RANITIDINE HCL 150 MG PO TABS: 150.0000 mg | ORAL_TABLET | Freq: Every day | ORAL | Status: DC

## 2015-07-26 NOTE — Patient Instructions (Signed)
1. We are checking a culture of her sputum to determine if there is an organism contributing to your cough. 2. Checking an x-ray of your chest today to determine if you have pneumonia. 3. We are also checking blood work today to determine if you have pneumonia. 4. I am starting you on doxycycline 1 pill twice daily which we will treat chew for bronchitis as well as pneumonia. Make sure you take this with a full glass of water remaining upright for 1 hour after taking the medicine. Make sure you do not eat or drink any dairy products  within 2 hours before or 2 hours after taking the medication. This medication can also make you sensitive to light and prone to sunburns. 5. Please avoid eating within 2 hours of bedtime to minimize any potential reflux. 6. We are starting you on Zantac (generic is Ranitidine) in case she will having silent reflux contributing to your cough and sore throat. Take this pill before you go to bed at night. Continue taking your Zegerid as prescribed. 7. We are checking a barium swallow to determine whether or not you may have a recurrence of your esophageal stricture. 8. You will return to clinic in 2 weeks but please contact us if he worsen.

## 2015-07-26 NOTE — Progress Notes (Signed)
Subjective:    Patient ID: [name], female    DOB: [birth year], 79 y.o.   MRN: [id number]  HPI Acute visit for patient with productive cough. Patient was treated for cough suppression with azithromycin, prednisone taper, & Hydromet cough syrup. Patient had previously called our office stating that cough returned as steroids were tapered.  Cough:  She reports her cough started around July 1. She reports her cough is unchanged since then. No better & no worse. She reports it is productive of a white-gray mucus. Previously it had been green. She reports she has had 3 courses of Prednisone & a couple courses of antibiotics. She reports both courses of antibiotics were Azithromycin. She reports she is coughing more at night and it does wake her up. She has had sweats as well as chills. Denies any subjective fever. She reports today that her cough hasn't improved at all during her courses of Prednisone. Notices the increased heat does seem to trigger her cough. Hydromet cough syrup seemed to help more than Tessalon Perles. Has also tried Delsym without relief.  Asthma:  She reports she has audible wheezing with her coughing. Wheezing is usually better in the daytime as well. She reports she is compliant with her Singulair, Budesonide, & Singulair. She does use her rescue inhaler once a night for her wheeze and it may have very mild improvement.  GERD:  Reports compliance with her Zegrid once daily qAM. She denies any reflux or dyspepsia. Denies any morning brash water taste. Previously has underwent EGD and esophageal dilation remotely. No dysphagia or odynophagia.   Review of Systems She does report a mild sore throat. She reports mild sinus congestion & drainage. No rashes. A pertinent 14 point review of systems is negative except as per the history of presenting illness.  Allergies  Allergen Reactions  . Aspirin Other (See Comments)    REACTION: regular strength causes "heart to beat fast"  .  Captopril Hypertension  . Naproxen Other (See Comments)    Tongue swelling  . Penicillins     Swelling around site  . Sulfonamide Derivatives Hives  . Clindamycin/Lincomycin    Current Outpatient Prescriptions on File Prior to Visit  Medication Sig Dispense Refill  . acetaminophen (TYLENOL) 325 MG tablet Take 2 tablets (650 mg total) by mouth every 6 (six) hours as needed for mild pain or moderate pain.    Marland Kitchen albuterol (PROVENTIL HFA;VENTOLIN HFA) 108 (90 BASE) MCG/ACT inhaler Inhale into the lungs every 6 (six) hours as needed for wheezing or shortness of breath.    . ALPRAZolam (XANAX) 0.5 MG tablet TAKE 2 TABLETS AT BEDTIME 60 tablet 1  . Ascorbic Acid (VITAMIN C PO) Take 1 tablet by mouth every morning.    . budesonide (PULMICORT) 0.5 MG/2ML nebulizer solution Take 2 mLs (0.5 mg total) by nebulization 2 (two) times daily. Dx 496 120 mL 6  . Calcium Carbonate-Vitamin D (CALCIUM 600+D) 600-400 MG-UNIT per tablet Take 1 tablet by mouth daily at 6 PM.     . cholecalciferol (VITAMIN D) 1000 UNITS tablet Take 1,000 Units by mouth 2 (two) times daily with breakfast and lunch.     . CRESTOR 20 MG tablet TAKE 1 TABLET ONCE A DAY 30 tablet 4  . denosumab (PROLIA) 60 MG/ML SOLN injection Inject 60 mg into the skin every 6 (six) months. Administer in upper arm, thigh, or abdomen 1 mL 0  . dextromethorphan-guaiFENesin (MUCINEX DM) 30-600 MG per 12 hr tablet Take 1  tablet by mouth 2 (two) times daily as needed. scheduled    . dicyclomine (BENTYL) 10 MG capsule TAKE (1) OR (2) CAPSULES EVERY SIX HOURS AS NEEDED FOR SPASM 60 capsule 3  . diltiazem (CARDIZEM CD) 120 MG 24 hr capsule Take 1 capsule (120 mg total) by mouth daily. 30 capsule 6  . fluticasone (FLONASE) 50 MCG/ACT nasal spray 2 SPRAYS IN EACH NOSTRIL ONCE A DAY 16 g 5  . levothyroxine (SYNTHROID, LEVOTHROID) 25 MCG tablet Take 50 mcg by mouth daily before breakfast.    . loratadine (CLARITIN) 10 MG tablet Take 10 mg by mouth daily.     .  metoprolol tartrate (LOPRESSOR) 25 MG tablet Take 1 tablet (25 mg total) by mouth 2 (two) times daily. 60 tablet 0  . montelukast (SINGULAIR) 10 MG tablet TAKE 1 TABLET ONCE A DAY 30 tablet 4  . Multiple Vitamin (MULTIVITAMIN WITH MINERALS) TABS Take 1 tablet by mouth daily.    . nitroGLYCERIN (NITROSTAT) 0.4 MG SL tablet Place 1 tablet (0.4 mg total) under the tongue every 5 (five) minutes as needed for chest pain. 30 tablet 1  . omega-3 acid ethyl esters (LOVAZA) 1 G capsule TAKE (2) CAPSULES TWICE DAILY. 120 capsule 4  . omeprazole-sodium bicarbonate (ZEGERID) 40-1100 MG per capsule TAKE (1) CAPSULE DAILY 30 capsule 3  . ondansetron (ZOFRAN) 4 MG tablet TAKE 1 TABLET EVERY 12 HOURS AS NEEDED FOR NAUSEA 20 tablet 1  . predniSONE (DELTASONE) 10 MG tablet Take 2 tabs daily with food x 7 days, then 1 tab daily with food x 7 days then stop 21 tablet 0  . PROAIR HFA 108 (90 BASE) MCG/ACT inhaler USE 2 PUFFS EVERY 6 HOURS AS NEEDED FOR WHEEZING 8.5 g 5  . sertraline (ZOLOFT) 100 MG tablet TAKE 1 TABLET ONCE A DAY 30 tablet 2  . traMADol (ULTRAM) 50 MG tablet Take 1 tablet (50 mg total) by mouth every 6 (six) hours as needed for moderate pain or severe pain. 20 tablet 0  . XARELTO 20 MG TABS tablet TAKE 1 TABLET ONCE A DAY 30 tablet 1   No current facility-administered medications on file prior to visit.   Past Medical History  Diagnosis Date  . GERD (gastroesophageal reflux disease)   . Anxiety disorder   . Arthritis   . CAD (coronary artery disease)     Stent to RI 2003.  Myoview 2013 no ischemia.  . Hypothyroid   . Asthmatic bronchitis   . Hypertension   . OA (osteoarthritis)   . Chronic bronchitis   . 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 Encephalitis     d/t meningitis  . PONV (postoperative nausea and vomiting)     history of cardiac arrest day 1 post surgery  in 2008  . Esophageal motility disorder   . Atrial fibrillation   . Hyperlipidemia   .  Fibromyalgia   . Vitamin D deficiency   . Status post dilation of esophageal narrowing   . Bowel obstruction     blockage  . Anxiety   . Congestive heart disease     Preserved EF  . Pleural effusion on right    Past Surgical History  Procedure Laterality Date  . Total knee arthroplasty Right   . Back surgery    . Coronary angioplasty with stent placement  2003  . Sinus surgery with instatrak    . Knee arthroscopy  03/26/2012    Procedure: ARTHROSCOPY  KNEE;  Surgeon: Wylene Simmer, MD;  Location: [facility];  Service: Orthopedics;  Laterality: Left;  with Debridement of Lateral Meniscus tear  . Rotator cuff repair Bilateral   . Ankle fusion  08/27/2012    Procedure: ARTHRODESIS ANKLE;  Surgeon: Wylene Simmer, MD;  Location: [facility];  Service: Orthopedics;  Laterality: Right;  Arthrodesis right ankle and subtalar joint  . Foot arthrodesis, subtalar Right 2013  . Removal of implant Right 03/31/2014    DR HEWITT  . Hardware revision  03/31/2014    SUBTALOR  ARTHRODESIS       DR HEWITT  . Hardware removal Right 03/31/2014    Procedure: REMOVAL OF DEEP IMPLANTS X 3  RIGHT ;  Surgeon: Wylene Simmer, MD;  Location: [facility];  Service: Orthopedics;  Laterality: Right;  . Arthrodesis tibiofibular Right 03/31/2014    Procedure: REVISION OF SUBTALOR ARTHRODESIS  RIGHT ;  Surgeon: Wylene Simmer, MD;  Location: [facility];  Service: Orthopedics;  Laterality: Right;  . Video bronchoscopy N/A 05/03/2014    Procedure: VIDEO BRONCHOSCOPY;  Surgeon: [name], MD;  Location: [facility];  Service: Thoracic;  Laterality: N/A;  . Video assisted thoracoscopy (vats)/empyema Right 05/03/2014    Procedure: VIDEO ASSISTED THORACOSCOPY (VATS)/EMPYEMA;  Surgeon: [name], MD;  Location: [facility];  Service: Thoracic;  Laterality: Right;  . Decortication Right 05/03/2014    Procedure: DECORTICATION;  Surgeon: [name], MD;  Location: [facility];  Service: Thoracic;  Laterality: Right;  . Hardware removal Right 11/03/2014     Procedure: [place];  Surgeon: Wylene Simmer, MD;  Location: [place];  Service: Orthopedics;  Laterality: Right;  . Bronchoscopy     Family History  Problem Relation Age of Onset  . Prostate cancer Brother   . Cancer Brother     PROSTATE  . Heart disease Mother   . Asthma Mother   . Congestive Heart Failure Mother   . Emphysema Sister   . COPD Sister   . Stroke Sister   . Heart disease Sister   . Emphysema Brother   . Rheumatologic disease Neg Hx    Social History   Social History  . Marital Status: Widowed    Spouse Name: N/A  . Number of Children: 2  . Years of Education: N/A   Occupational History  . Retired    Social History Main Topics  . Smoking status: Former Smoker -- 1.00 packs/day for 30 years    Types: Cigarettes    Quit date: 12/16/1989  . Smokeless tobacco: Never Used     Comment: smoked off & on  . Alcohol Use: No  . Drug Use: No  . Sexual Activity: No   Other Topics Concern  . None   Social History Narrative   Originally from [place]. Always lived in [place]. No international travel. Has prior travel to [place], [place], [place], [place], [place], & [place]. No recent travel. She has a cat currently. No prior bird, mold, or hot tub exposure. 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 No known asbestos exposure. 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       Objective:   Physical Exam Weight 163 lb 6.4 oz (74.118 kg). General:  Awake. Alert. No acute distress.  Integument:  Warm & dry. No rash on exposed skin. No bruising. Lymphatics:  No appreciated cervical or supraclavicular lymphadenoapthy. HEENT:  Moist mucus membranes.  No oral ulcers. No scleral injection or icterus.  No nasal turbinate swelling. PERRL. Cardiovascular:  Regular rate. No edema. No appreciable JVD.  Pulmonary:  Good aeration & clear to auscultation bilaterally. Symmetric chest wall expansion.  No accessory muscle use. Abdomen: Soft. Normal bowel sounds. Nondistended. Grossly nontender. Musculoskeletal:  Normal bulk and tone. Hand grip strength 5/5 bilaterally. No joint deformity or effusion appreciated. Neurological:  CN 2-12 grossly in tact. No meningismus. Moving all 4 extremities equally. Symmetric patellar deep tendon reflexes. Psychiatric:  Mood and affect congruent. Speech normal rhythm, rate & tone.   PFT 06/24/13: FVC 2.13 L (84%) FEV1 1.66 L (87%) FEV1/FVC 0.78 FEF 25-75 1.55 L (93%)  LABS 06/09/15 CBC: 6.9/12.7/41.1/? BMP: 138/4.05/05/23/9/0.65/86/9.4 LFT: 4.6/?/0.5/77/24/18  MICROBIOLOGY BAL (05/03/14): MSSA / C albicans / AFB negative Right Pleural Effusion (05/03/14):  negative    Assessment & Plan:  The patient is a 79 year old female with prior history of asthma & GERD with remote esophageal dilation. She presents with a month-long history of a cough that does not seem to be resolving with treatment with 2 separate courses of azithromycin as well as repeat courses of prednisone. She does truly seem to have more of an upper airways inflammatory process and given her sore throat I question whether or not she is having silent laryngo-esophageal reflux contributing to her symptoms. Certainly an infectious process is possible given her symptoms and this needs to be investigated further. She may be having a mild exacerbation of her underlying asthma but I feel this is not the primary process contributing to her ongoing symptoms. She has no symptoms that would suggest postnasal drainage as the etiology for her cough either. I instructed the patient contacted our office if she had any further questions or concerns.  1. Cough: Empiric treatment for infection with doxycycline 100 mg by mouth twice a day 10 days. Checking sputum culture for AFB, fungus, and bacteria. Checking CBC with differential as well as chest x-ray PA/LAT today. Instructed patient to continue her inhalers as  prescribed and use her albuterol either by inhaler or nebulizer 3 times daily for the time being. 2. GERD: Patient to continue taking Zegerid as prescribed. Starting Zantac 150 mg by mouth daily at bedtime. Counseled patient to avoid eating within 2 hours of bedtime as she is currently snacking. Checking barium swallow. 3. Asthma with mild exacerbation: Holding on further steroid therapy at this time. Patient to continue inhaled steroids along with Singulair and use her albuterol via nebulizer or inhaler 3 times a day. 4. Follow-up: Patient to return to clinic in 2 weeks to review her test results.

## 2015-07-27 ENCOUNTER — Telehealth: Payer: Self-pay | Admitting: Pulmonary Disease

## 2015-07-27 NOTE — Telephone Encounter (Signed)
Result Note     Please call the patient and let her know I reviewed her CXR and there's no evidence of pneumonia. Her blood work only shows changes I would expect from her prednisone use. Thanks.   I spoke with patient about results and she verbalized understanding and had no questions.

## 2015-07-29 LAB — RESPIRATORY CULTURE OR RESPIRATORY AND SPUTUM CULTURE
Culture: NORMAL
Organism ID, Bacteria: NORMAL

## 2015-07-31 DIAGNOSIS — H2511 Age-related nuclear cataract, right eye: Secondary | ICD-10-CM | POA: Diagnosis not present

## 2015-07-31 DIAGNOSIS — H25811 Combined forms of age-related cataract, right eye: Secondary | ICD-10-CM | POA: Diagnosis not present

## 2015-08-01 DIAGNOSIS — R069 Unspecified abnormalities of breathing: Secondary | ICD-10-CM | POA: Diagnosis not present

## 2015-08-03 ENCOUNTER — Telehealth: Payer: Self-pay | Admitting: Family Medicine

## 2015-08-03 ENCOUNTER — Ambulatory Visit (HOSPITAL_COMMUNITY): Payer: Medicare Other

## 2015-08-03 MED ORDER — MECLIZINE HCL 25 MG PO TABS: 25.0000 mg | ORAL_TABLET | Freq: Three times a day (TID) | ORAL | Status: DC | PRN

## 2015-08-03 NOTE — Telephone Encounter (Signed)
Complaints of dizziness.  Has had this problem in the past and has taken meclizine.  Advised that it will make her drowsy and to move carefully to avoid falls.  She will follow up if symptoms worsen or fail to improve.

## 2015-08-04 ENCOUNTER — Telehealth: Payer: Self-pay | Admitting: Family Medicine

## 2015-08-04 NOTE — Telephone Encounter (Signed)
Pt states that she has fell twice - she had eye surg - she thinks it was from the medication from Surg.  Dizziness is better with Meclizine.

## 2015-08-15 ENCOUNTER — Ambulatory Visit (HOSPITAL_COMMUNITY)
Admission: RE | Admit: 2015-08-15 | Discharge: 2015-08-15 | Disposition: A | Discharge status: Home / Self Care | Payer: Medicare Other | Source: Ambulatory Visit | Attending: Pulmonary Disease | Admitting: Pulmonary Disease

## 2015-08-15 ENCOUNTER — Encounter: Payer: Self-pay | Admitting: Adult Health

## 2015-08-15 ENCOUNTER — Telehealth: Payer: Self-pay | Admitting: Pulmonary Disease

## 2015-08-15 ENCOUNTER — Ambulatory Visit (INDEPENDENT_AMBULATORY_CARE_PROVIDER_SITE_OTHER): Payer: Medicare Other | Admitting: Adult Health

## 2015-08-15 VITALS — BP 132/84 | HR 64 | Temp 97.9°F | Ht 61.0 in | Wt 163.0 lb

## 2015-08-15 DIAGNOSIS — J4531 Mild persistent asthma with (acute) exacerbation: Secondary | ICD-10-CM

## 2015-08-15 DIAGNOSIS — K219 Gastro-esophageal reflux disease without esophagitis: Secondary | ICD-10-CM | POA: Insufficient documentation

## 2015-08-15 DIAGNOSIS — R05 Cough: Secondary | ICD-10-CM | POA: Diagnosis not present

## 2015-08-15 NOTE — Telephone Encounter (Signed)
Patient contacted regarding the results of her Barium Swallow. Continuing current reflux regimen/Zantac. Plan to arrange for a f/u with GI. Patient advised to elevate the head of her bed by 3-5 inches as well.

## 2015-08-15 NOTE — Assessment & Plan Note (Signed)
Await Barium swallow results  Cont on Zantac At bedtime   GERD diet

## 2015-08-15 NOTE — Assessment & Plan Note (Signed)
Recent slow to resolve Asthmatic Bronchitic exacerbation with suspected GERD triggers  She is improving after second course of abx.  cxr was reassuring with no acute process Sputum cx was neg. Follow sputum AFB and fungal for final (neg to date)  Cont on GERD tx, follow up on Barium swallow.  Cont on current regimen .

## 2015-08-15 NOTE — Patient Instructions (Signed)
Continue on current regimen  Follow up Dr. Elsworth Soho  In 2-3 months and As needed

## 2015-08-15 NOTE — Progress Notes (Signed)
   Subjective:    Patient ID: [name], female    DOB: [birth year], 79 y.o.   MRN: [id number]  HPI  78/F remote ex smoker with reactive airway disease - ? GERD vs sinusitis, nml pFTs  She smoked a PPD x 20-25 yrs before quitting in 1990. She develops recurrent chest colds requiring several rounds of Abx to get better.    Significant tests/ events  SHe underwent esophageal dilation for hypertensive LES (Dr Olevia Perches) & is on protonix two times a day . Evaluation for chest pain in 05/2009 (Dr Percival Spanish) -no obstructive CAD - attributed to esophageal spasm.Sucralfate added by GI to zegerid .  Took macrodantin x 2 yrs for UTIs , stopped 2010  Prednisone makes her hyper and nervous. Has osteoporosis with fractures.  PFTs nml '11 .  06/2013 Spirometry -no obstruction   RAST - IgE 225 -high but no sensitivity to common indoor/ outdoor allergens  Underwent endoscopic sinus surgery 1/12 by dr Constance Holster  Treated x 6 wks with clinda for chronic sinusitis, started wheezing again when ABx stopped    Admitted 04/2014  for acute hypoxic respiratory failure, secondary to pneumonia, asthma, exacerbation, congestive heart failure, and MRSA empyema requiring vats on May 19, complicated by an element of vocal cord dysfunction, and chronic cough. Patient did have upper airway wheezing, consistent with vocal cord dysfunction. ENT eval >> laryngoscopy bedside- consistent with diffuse inflammation of the vocal cords, and supraglottic larynx.    01/2015 cough + wheeze >> pred given, lovaza stopped, tessalon really helped  08/15/2015 Follow up : RAD /Bronchitis -[name] OV  Pt returns  For follow up . Seen 2 weeks ago with slow to resolve bronchitic flare despite zpack and pred taper.  Seen by Dr. Ashok Cordia. Given Doxycycline for 1 week.  CXR with no acute process.  Set up for Barium swallow which was done this am.  She was started on Zantac for probable GERD component .  Sputum cx neg , prelim AFB and Fungal neg  to date.  Eos on cbc/diff nml . WBC was elevated at 16K  She is feeling better. Cough and wheezing are much less.  Congestion is clear now. Still has some nasal drip . Remains on Budesonide neb Twice daily   Denies chest pain, orthopnea, edema , fever or hemoptysis     Review of Systems neg for any significant sore throat, dysphagia, itching, sneezing, nasal congestion or excess/ purulent secretions, fever, chills, sweats, unintended wt loss, pleuritic or exertional cp, hempoptysis, orthopnea pnd or change in chronic leg swelling. Also denies presyncope, palpitations, heartburn, abdominal pain, nausea, vomiting, diarrhea or change in bowel or urinary habits, dysuria,hematuria, rash, arthralgias, visual complaints, headache, numbness weakness or ataxia.     Objective:   Physical Exam  Gen. Pleasant, well-nourished, in no distress ENT - no lesions, no post nasal drip Neck: No JVD, no thyromegaly, no carotid bruits Lungs: no use of accessory muscles, no dullness to percussion, clear without rales or rhonchi  Cardiovascular: Rhythm regular, heart sounds  normal, no murmurs or gallops, no peripheral edema Musculoskeletal: No deformities, no cyanosis or clubbing         Assessment & Plan:

## 2015-08-16 ENCOUNTER — Other Ambulatory Visit: Payer: Self-pay | Admitting: Pulmonary Disease

## 2015-08-16 ENCOUNTER — Other Ambulatory Visit: Payer: Self-pay | Admitting: Family Medicine

## 2015-08-16 DIAGNOSIS — K219 Gastro-esophageal reflux disease without esophagitis: Secondary | ICD-10-CM

## 2015-08-17 NOTE — Progress Notes (Signed)
Reviewed & agree with plan  

## 2015-08-21 ENCOUNTER — Other Ambulatory Visit: Payer: Self-pay | Admitting: Family Medicine

## 2015-08-21 LAB — FUNGUS CULTURE W SMEAR: Smear Result: NONE SEEN

## 2015-08-21 MED ORDER — CIPROFLOXACIN HCL 250 MG PO TABS: 250.0000 mg | ORAL_TABLET | Freq: Two times a day (BID) | ORAL | Status: DC

## 2015-08-22 ENCOUNTER — Encounter: Payer: Self-pay | Admitting: Family Medicine

## 2015-08-22 ENCOUNTER — Other Ambulatory Visit: Payer: Self-pay | Admitting: Cardiology

## 2015-08-22 ENCOUNTER — Ambulatory Visit (INDEPENDENT_AMBULATORY_CARE_PROVIDER_SITE_OTHER): Payer: Medicare Other | Admitting: Family Medicine

## 2015-08-22 VITALS — BP 146/97 | HR 65 | Temp 97.4°F | Ht 61.0 in | Wt 161.0 lb

## 2015-08-22 DIAGNOSIS — R309 Painful micturition, unspecified: Secondary | ICD-10-CM | POA: Diagnosis not present

## 2015-08-22 DIAGNOSIS — N3000 Acute cystitis without hematuria: Secondary | ICD-10-CM

## 2015-08-22 DIAGNOSIS — N390 Urinary tract infection, site not specified: Secondary | ICD-10-CM | POA: Insufficient documentation

## 2015-08-22 LAB — POCT UA - MICROSCOPIC ONLY
Casts, Ur, LPF, POC: NEGATIVE
Crystals, Ur, HPF, POC: NEGATIVE
Mucus, UA: NEGATIVE
Yeast, UA: NEGATIVE

## 2015-08-22 LAB — POCT URINALYSIS DIPSTICK
Bilirubin, UA: NEGATIVE
Blood, UA: NEGATIVE
Glucose, UA: NEGATIVE
Ketones, UA: NEGATIVE
Nitrite, UA: NEGATIVE
Protein, UA: NEGATIVE
Spec Grav, UA: 1.015
Urobilinogen, UA: NEGATIVE
pH, UA: 6

## 2015-08-22 MED ORDER — CIPROFLOXACIN HCL 250 MG PO TABS: 250.0000 mg | ORAL_TABLET | Freq: Two times a day (BID) | ORAL | Status: DC

## 2015-08-22 NOTE — Progress Notes (Signed)
   HPI  Patient presents today er concern of UTI  Patient explains that she's had lower abdominal pain and painful urination for the last 3 days. She also states that she's had malaise. She describes very severe cutting type dysuria with urination. She's also had nocturia.  She denies fever chills, sweats, or oral intolerance. She does have malaise and feels generally unwell.  He was started on Cipro over the weekend due to her severe symptoms, she is finished 2 doses of that and states that her symptoms have begun improving. She is also taking Azo cranberry and has used one hydrocodone for the pain.  PMH: Smoking status noted ROS: Per HPI  Objective: BP 146/97 mmHg  Pulse 65  Temp(Src) 97.4 F (36.3 C) (Oral)  Ht 5\' 1"  (1.549 m)  Wt 161 lb (73.029 kg)  BMI 30.44 kg/m2 Gen: NAD, alert, cooperative with exam HEENT: NCAT CV: RRR, good S1/S2, no murmur Resp: CTABL, no wheezes, non-labored Abd: soft, nontender no CVA tenderness, no suprapubic tenderness Ext: No edema, warm Neuro: Alert and oriented, No gross deficits  Assessment and plan:  # UTI Symptoms and UA consistent with UTI, continue ciprofloxacin course Treat as complicated UTI for 7 days given age, malaise and CHF Culture, may be negative with 3 doses of cipro already   Orders Placed This Encounter  Procedures  . Urine culture  . POCT urinalysis dipstick  . POCT UA - Microscopic Only    Meds ordered this encounter  Medications  . ciprofloxacin (CIPRO) 250 MG tablet    Sig: Take 1 tablet (250 mg total) by mouth 2 (two) times daily.    Dispense:  11 tablet    Refill:  0    Laroy Apple, MD [facility] 08/22/2015, 3:29 PM

## 2015-08-22 NOTE — Patient Instructions (Signed)
Great to meet you!  Come back if you get worse or do not get better like we expect.   Urinary Tract Infection Urinary tract infections (UTIs) can develop anywhere along your urinary tract. Your urinary tract is your body's drainage system for removing wastes and extra water. Your urinary tract includes two kidneys, two ureters, a bladder, and a urethra. Your kidneys are a pair of bean-shaped organs. Each kidney is about the size of your fist. They are located below your ribs, one on each side of your spine. CAUSES Infections are caused by microbes, which are microscopic organisms, including fungi, viruses, and bacteria. These organisms are so small that they can only be seen through a microscope. Bacteria are the microbes that most commonly cause UTIs. SYMPTOMS  Symptoms of UTIs may vary by age and gender of the patient and by the location of the infection. Symptoms in young women typically include a frequent and intense urge to urinate and a painful, burning feeling in the bladder or urethra during urination. Older women and men are more likely to be tired, shaky, and weak and have muscle aches and abdominal pain. A fever may mean the infection is in your kidneys. Other symptoms of a kidney infection include pain in your back or sides below the ribs, nausea, and vomiting. DIAGNOSIS To diagnose a UTI, your caregiver will ask you about your symptoms. Your caregiver also will ask to provide a urine sample. The urine sample will be tested for bacteria and white blood cells. White blood cells are made by your body to help fight infection. TREATMENT  Typically, UTIs can be treated with medication. Because most UTIs are caused by a bacterial infection, they usually can be treated with the use of antibiotics. The choice of antibiotic and length of treatment depend on your symptoms and the type of bacteria causing your infection. HOME CARE INSTRUCTIONS  If you were prescribed antibiotics, take them exactly  as your caregiver instructs you. Finish the medication even if you feel better after you have only taken some of the medication.  Drink enough water and fluids to keep your urine clear or pale yellow.  Avoid caffeine, tea, and carbonated beverages. They tend to irritate your bladder.  Empty your bladder often. Avoid holding urine for long periods of time.  Empty your bladder before and after sexual intercourse.  After a bowel movement, women should cleanse from front to back. Use each tissue only once. SEEK MEDICAL CARE IF:   You have back pain.  You develop a fever.  Your symptoms do not begin to resolve within 3 days. SEEK IMMEDIATE MEDICAL CARE IF:   You have severe back pain or lower abdominal pain.  You develop chills.  You have nausea or vomiting.  You have continued burning or discomfort with urination. MAKE SURE YOU:   Understand these instructions.  Will watch your condition.  Will get help right away if you are not doing well or get worse. Document Released: 09/11/2005 Document Revised: 06/02/2012 Document Reviewed: 01/10/2012 [facility] Patient Information 2015 [place], [place]. This information is not intended to replace advice given to you by your health care provider. Make sure you discuss any questions you have with your health care provider.

## 2015-08-22 NOTE — Telephone Encounter (Signed)
REFILL 

## 2015-08-24 LAB — URINE CULTURE: Organism ID, Bacteria: NO GROWTH

## 2015-09-04 ENCOUNTER — Telehealth: Payer: Self-pay | Admitting: Family Medicine

## 2015-09-06 NOTE — Telephone Encounter (Signed)
Spoke with representative with [employer].  They received the additional information sent 09/01/15 regarding approval of Prolia.  Prolia has now been approved from 09/04/2015 through 09/03/2016,  Approval # [id number]. Notified her pharmacy. They ran and was zero copay.  Patient was notified and has appt for 09/08/15

## 2015-09-06 NOTE — Telephone Encounter (Signed)
Patient called - we sent in appeal information 09/01/15.  Will follow up on appeal.

## 2015-09-07 LAB — AFB CULTURE WITH SMEAR (NOT AT ARMC): Acid Fast Smear: NONE SEEN

## 2015-09-08 ENCOUNTER — Ambulatory Visit: Payer: Self-pay | Admitting: Pharmacist

## 2015-09-08 ENCOUNTER — Ambulatory Visit (INDEPENDENT_AMBULATORY_CARE_PROVIDER_SITE_OTHER): Payer: Medicare Other | Admitting: Physician Assistant

## 2015-09-08 VITALS — BP 151/70 | HR 65 | Temp 97.8°F | Ht 61.0 in | Wt 161.0 lb

## 2015-09-08 DIAGNOSIS — N390 Urinary tract infection, site not specified: Secondary | ICD-10-CM

## 2015-09-08 DIAGNOSIS — R319 Hematuria, unspecified: Secondary | ICD-10-CM | POA: Diagnosis not present

## 2015-09-08 LAB — POCT URINALYSIS DIPSTICK
Bilirubin, UA: NEGATIVE
Glucose, UA: NEGATIVE
Ketones, UA: NEGATIVE
Nitrite, UA: NEGATIVE
Spec Grav, UA: 1.015
Urobilinogen, UA: NEGATIVE
pH, UA: 6

## 2015-09-08 LAB — POCT UA - MICROSCOPIC ONLY
Bacteria, U Microscopic: NEGATIVE
Casts, Ur, LPF, POC: NEGATIVE
Crystals, Ur, HPF, POC: NEGATIVE
Mucus, UA: NEGATIVE
Yeast, UA: NEGATIVE

## 2015-09-08 MED ORDER — HYDROCODONE-ACETAMINOPHEN 5-325 MG PO TABS: 1.0000 | ORAL_TABLET | Freq: Four times a day (QID) | ORAL | Status: DC | PRN

## 2015-09-08 MED ORDER — CIPROFLOXACIN HCL 500 MG PO TABS: 500.0000 mg | ORAL_TABLET | Freq: Two times a day (BID) | ORAL | Status: DC

## 2015-09-08 NOTE — Progress Notes (Signed)
   Subjective:    Patient ID: [name], female    DOB: 05-15-1936, 79 y.o.   MRN: [id number]  HPI 79 y/o female presents with c/o blood in urine x 1 day. SHe was having pain in her back last night when she went to bed. She has had similar episodes in the past and was treated for UTI. She takes Xarelto. She took some of her neighbors hydrocodone, which helped with the pain . She has not had kidney stones in the past.     Review of Systems  Constitutional: Negative.   HENT: Negative.   Respiratory: Negative.   Cardiovascular: Negative.   Gastrointestinal: Negative.   Endocrine: Negative for polyuria.  Genitourinary: Positive for dysuria, urgency and hematuria. Negative for frequency.  Musculoskeletal: Positive for back pain.       Objective:   Physical Exam  Constitutional: She is oriented to person, place, and time. She appears well-developed and well-nourished. No distress.  Abdominal: Soft. She exhibits no mass. There is tenderness.  Neurological: She is alert and oriented to person, place, and time.  Skin: She is not diaphoretic.  Psychiatric: She has a normal mood and affect. Her behavior is normal. Judgment and thought content normal.  Nursing note and vitals reviewed.  Results for orders placed or performed in visit on 09/08/15  POCT UA - Microscopic Only  Result Value Ref Range   WBC, Ur, HPF, POC 40-80    RBC, urine, microscopic 20-40    Bacteria, U Microscopic neg    Mucus, UA neg    Epithelial cells, urine per micros occ    Crystals, Ur, HPF, POC neg    Casts, Ur, LPF, POC neg    Yeast, UA neg   POCT urinalysis dipstick  Result Value Ref Range   Color, UA gold    Clarity, UA cloudy    Glucose, UA neg    Bilirubin, UA neg    Ketones, UA neg    Spec Grav, UA 1.015    Blood, UA large    pH, UA 6.0    Protein, UA trace    Urobilinogen, UA negative    Nitrite, UA neg    Leukocytes, UA moderate (2+) (A) Negative           Assessment & Plan:  1.  Hematuria  - POCT UA - Microscopic Only - POCT urinalysis dipstick - HYDROcodone-acetaminophen (NORCO) 5-325 MG per tablet; Take 1 tablet by mouth every 6 (six) hours as needed for moderate pain.  Dispense: 30 tablet; Refill: 0 - Urine culture - Ambulatory referral to Urology - ciprofloxacin (CIPRO) 500 MG tablet; Take 1 tablet (500 mg total) by mouth 2 (two) times daily.  Dispense: 20 tablet; Refill: 0  2. Urinary tract infection with hematuria, site unspecified  - HYDROcodone-acetaminophen (NORCO) 5-325 MG per tablet; Take 1 tablet by mouth every 6 (six) hours as needed for moderate pain.  Dispense: 30 tablet; Refill: 0 - Urine culture - Ambulatory referral to Urology - ciprofloxacin (CIPRO) 500 MG tablet; Take 1 tablet (500 mg total) by mouth 2 (two) times daily.  Dispense: 20 tablet; Refill: 0  3. Recurrent UTI  - Ambulatory referral to Urology    Leonette Tischer A. Benjamin Stain PA-C

## 2015-09-11 LAB — URINE CULTURE

## 2015-09-12 ENCOUNTER — Other Ambulatory Visit: Payer: Self-pay | Admitting: Physician Assistant

## 2015-09-12 DIAGNOSIS — N3090 Cystitis, unspecified without hematuria: Secondary | ICD-10-CM

## 2015-09-12 MED ORDER — CEFUROXIME AXETIL 250 MG PO TABS: 250.0000 mg | ORAL_TABLET | Freq: Two times a day (BID) | ORAL | Status: DC

## 2015-09-13 ENCOUNTER — Telehealth: Payer: Self-pay | Admitting: Physician Assistant

## 2015-09-13 NOTE — Telephone Encounter (Signed)
Ceftin was sent to [facility].  It was cancelled and called to [facility] per patient request.

## 2015-09-14 ENCOUNTER — Ambulatory Visit: Payer: Medicare Other

## 2015-09-19 ENCOUNTER — Ambulatory Visit (INDEPENDENT_AMBULATORY_CARE_PROVIDER_SITE_OTHER): Payer: Medicare Other | Admitting: Urology

## 2015-09-19 DIAGNOSIS — R339 Retention of urine, unspecified: Secondary | ICD-10-CM | POA: Diagnosis not present

## 2015-09-19 DIAGNOSIS — N3020 Other chronic cystitis without hematuria: Secondary | ICD-10-CM

## 2015-09-19 DIAGNOSIS — N952 Postmenopausal atrophic vaginitis: Secondary | ICD-10-CM | POA: Diagnosis not present

## 2015-09-19 DIAGNOSIS — J449 Chronic obstructive pulmonary disease, unspecified: Secondary | ICD-10-CM | POA: Diagnosis not present

## 2015-09-20 ENCOUNTER — Other Ambulatory Visit: Payer: Self-pay | Admitting: Urology

## 2015-09-20 DIAGNOSIS — N952 Postmenopausal atrophic vaginitis: Secondary | ICD-10-CM

## 2015-09-21 DIAGNOSIS — M7741 Metatarsalgia, right foot: Secondary | ICD-10-CM | POA: Diagnosis not present

## 2015-09-21 DIAGNOSIS — R269 Unspecified abnormalities of gait and mobility: Secondary | ICD-10-CM | POA: Diagnosis not present

## 2015-09-21 DIAGNOSIS — M12571 Traumatic arthropathy, right ankle and foot: Secondary | ICD-10-CM | POA: Diagnosis not present

## 2015-09-21 DIAGNOSIS — M21171 Varus deformity, not elsewhere classified, right ankle: Secondary | ICD-10-CM | POA: Diagnosis not present

## 2015-09-21 DIAGNOSIS — M24575 Contracture, left foot: Secondary | ICD-10-CM | POA: Diagnosis not present

## 2015-09-22 ENCOUNTER — Encounter: Payer: Self-pay | Admitting: Pharmacist

## 2015-09-22 ENCOUNTER — Other Ambulatory Visit: Payer: Self-pay | Admitting: Cardiology

## 2015-09-22 ENCOUNTER — Other Ambulatory Visit: Payer: Self-pay | Admitting: Family Medicine

## 2015-09-22 ENCOUNTER — Ambulatory Visit: Payer: Self-pay | Admitting: Pharmacist

## 2015-09-22 ENCOUNTER — Ambulatory Visit (INDEPENDENT_AMBULATORY_CARE_PROVIDER_SITE_OTHER): Payer: Medicare Other | Admitting: Pharmacist

## 2015-09-22 VITALS — BP 142/82 | HR 63 | Ht 60.0 in | Wt 159.5 lb

## 2015-09-22 DIAGNOSIS — Z23 Encounter for immunization: Secondary | ICD-10-CM | POA: Diagnosis not present

## 2015-09-22 DIAGNOSIS — Z0000 Encounter for general adult medical examination without abnormal findings: Secondary | ICD-10-CM

## 2015-09-22 DIAGNOSIS — M810 Age-related osteoporosis without current pathological fracture: Secondary | ICD-10-CM

## 2015-09-22 DIAGNOSIS — N390 Urinary tract infection, site not specified: Secondary | ICD-10-CM

## 2015-09-22 DIAGNOSIS — R319 Hematuria, unspecified: Secondary | ICD-10-CM

## 2015-09-22 MED ORDER — DENOSUMAB 60 MG/ML ~~LOC~~ SOLN
60.0000 mg | Freq: Once | SUBCUTANEOUS | Status: AC
  Administered 2015-09-22: 60 mg via SUBCUTANEOUS

## 2015-09-22 MED ORDER — MELATONIN 3 MG PO TABS: 3.0000 mg | ORAL_TABLET | Freq: Every evening | ORAL | Status: DC | PRN

## 2015-09-22 MED ORDER — HYDROCODONE-ACETAMINOPHEN 5-325 MG PO TABS: 1.0000 | ORAL_TABLET | Freq: Four times a day (QID) | ORAL | Status: DC | PRN

## 2015-09-22 NOTE — Progress Notes (Signed)
Patient ID: [name], female   DOB: 08-25-1936, 79 y.o.   MRN: [id number]    Subjective:   [name] is a 79 y.o. widowed, white  female who presents for a Subsequent Medicare Annual Wellness Visit and to received Prolia injection.    [name] reports that her biggest health concern is her foot.  She has visit with Dr Raelyn Ensign and he did not recommend surgery but they are trying to get patient's insurance to approve a brace.  She also is having more back pain and has call into ortho to have spinal injection.    Current Medications (verified) Outpatient Encounter Prescriptions as of 09/22/2015  Medication Sig  . acetaminophen (TYLENOL) 500 MG tablet Take 500-1,000 mg by mouth every 8 (eight) hours as needed.  . ALPRAZolam (XANAX) 0.5 MG tablet Take 2 tablets (1 mg total) by mouth at bedtime. Take 1 to 2 tablet by mouth at bedtime  . Ascorbic Acid (VITAMIN C PO) Take 1 tablet by mouth every morning.  . budesonide (PULMICORT) 0.5 MG/2ML nebulizer solution Take 2 mLs (0.5 mg total) by nebulization 2 (two) times daily. Dx 496  . Calcium Carbonate-Vitamin D (CALCIUM 600+D) 600-400 MG-UNIT per tablet Take 1 tablet by mouth daily at 6 PM.   . cholecalciferol (VITAMIN D) 1000 UNITS tablet Take 1,000 Units by mouth 2 (two) times daily with breakfast and lunch.   . CRESTOR 20 MG tablet TAKE 1 TABLET ONCE A DAY  . denosumab (PROLIA) 60 MG/ML SOLN injection Inject 60 mg into the skin every 6 (six) months. Administer in upper arm, thigh, or abdomen  . dextromethorphan-guaiFENesin (MUCINEX DM) 30-600 MG per 12 hr tablet Take 1 tablet by mouth 2 (two) times daily as needed. scheduled  . dicyclomine (BENTYL) 10 MG capsule TAKE (1) OR (2) CAPSULES EVERY SIX HOURS AS NEEDED FOR SPASM  . diltiazem (CARDIZEM CD) 120 MG 24 hr capsule Take 1 capsule (120 mg total) by mouth daily.  . fluticasone (FLONASE) 50 MCG/ACT nasal spray 2 SPRAYS IN EACH NOSTRIL ONCE A DAY  . HYDROcodone-acetaminophen (NORCO)  5-325 MG tablet Take 1 tablet by mouth every 6 (six) hours as needed for moderate pain.  Marland Kitchen levothyroxine (SYNTHROID, LEVOTHROID) 25 MCG tablet Take 50 mcg by mouth daily before breakfast.  . loratadine (CLARITIN) 10 MG tablet Take 10 mg by mouth daily.   . meclizine (ANTIVERT) 25 MG tablet Take 1 tablet (25 mg total) by mouth 3 (three) times daily as needed for dizziness.  . montelukast (SINGULAIR) 10 MG tablet TAKE 1 TABLET ONCE A DAY  . Multiple Vitamin (MULTIVITAMIN WITH MINERALS) TABS Take 1 tablet by mouth daily.  . nitroGLYCERIN (NITROSTAT) 0.4 MG SL tablet Place 1 tablet (0.4 mg total) under the tongue every 5 (five) minutes as needed for chest pain.  Marland Kitchen omega-3 acid ethyl esters (LOVAZA) 1 G capsule TAKE (2) CAPSULES TWICE DAILY.  Marland Kitchen omeprazole-sodium bicarbonate (ZEGERID) 40-1100 MG per capsule TAKE (1) CAPSULE DAILY  . ondansetron (ZOFRAN) 4 MG tablet TAKE 1 TABLET EVERY 12 HOURS AS NEEDED FOR NAUSEA  . PROAIR HFA 108 (90 BASE) MCG/ACT inhaler USE 2 PUFFS EVERY 6 HOURS AS NEEDED FOR WHEEZING  . ranitidine (ZANTAC) 150 MG tablet Take 1 tablet (150 mg total) by mouth at bedtime.  . sertraline (ZOLOFT) 100 MG tablet TAKE 1 TABLET ONCE A DAY  . XARELTO 20 MG TABS tablet TAKE 1 TABLET ONCE A DAY  . [DISCONTINUED] ALPRAZolam (XANAX) 0.5 MG tablet TAKE 2  TABLETS AT BEDTIME  . [DISCONTINUED] HYDROcodone-acetaminophen (NORCO) 5-325 MG per tablet Take 1 tablet by mouth every 6 (six) hours as needed for moderate pain.  . [DISCONTINUED] metoprolol tartrate (LOPRESSOR) 25 MG tablet Take 1 tablet (25 mg total) by mouth 2 (two) times daily.  . [DISCONTINUED] acetaminophen (TYLENOL) 325 MG tablet Take 2 tablets (650 mg total) by mouth every 6 (six) hours as needed for mild pain or moderate pain. (Patient not taking: Reported on 09/22/2015)  . [DISCONTINUED] albuterol (PROVENTIL HFA;VENTOLIN HFA) 108 (90 BASE) MCG/ACT inhaler Inhale into the lungs every 6 (six) hours as needed for wheezing or shortness of  breath.  . [DISCONTINUED] cefUROXime (CEFTIN) 250 MG tablet Take 1 tablet (250 mg total) by mouth 2 (two) times daily with a meal.  . [DISCONTINUED] ciprofloxacin (CIPRO) 500 MG tablet Take 1 tablet (500 mg total) by mouth 2 (two) times daily.  . [DISCONTINUED] ketorolac (ACULAR) 0.4 % SOLN Place 4 drops into the right eye 4 (four) times daily.  . [EXPIRED] denosumab (PROLIA) injection 60 mg    No facility-administered encounter medications on file as of 09/22/2015.    Allergies (verified) Aspirin; Captopril; Naproxen; Penicillins; Sulfonamide derivatives; and Clindamycin/lincomycin   History: Past Medical History  Diagnosis Date  . GERD (gastroesophageal reflux disease)   . Anxiety disorder   . Arthritis   . CAD (coronary artery disease)     Stent to RI 2003.  Myoview 2013 no ischemia.  . Hypothyroid   . Asthmatic bronchitis   . Hypertension   . OA (osteoarthritis)   . Chronic bronchitis ([place])   . 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 Encephalitis     d/t meningitis  . PONV (postoperative nausea and vomiting)     history of cardiac arrest day 1 post surgery  in 2008  . Esophageal motility disorder   . Atrial fibrillation ([place])   . Hyperlipidemia   . Fibromyalgia   . Vitamin D deficiency   . Status post dilation of esophageal narrowing   . Bowel obstruction (HCC)     blockage  . Anxiety   . Congestive heart disease (HCC)     Preserved EF  . Pleural effusion on right   . Cataract    Past Surgical History  Procedure Laterality Date  . Total knee arthroplasty Right   . Back surgery    . Coronary angioplasty with stent placement  2003  . Sinus surgery with instatrak    . Knee arthroscopy  03/26/2012    Procedure: ARTHROSCOPY KNEE;  Surgeon: Wylene Simmer, MD;  Location: [place] [facility];  Service: Orthopedics;  Laterality: Left;  with Debridement of Lateral Meniscus tear  . Rotator cuff repair Bilateral   . Ankle fusion  08/27/2012    Procedure:  ARTHRODESIS ANKLE;  Surgeon: Wylene Simmer, MD;  Location: [facility];  Service: Orthopedics;  Laterality: Right;  Arthrodesis right ankle and subtalar joint  . Foot arthrodesis, subtalar Right 2013  . Removal of implant Right 03/31/2014    DR HEWITT  . Hardware revision  03/31/2014    SUBTALOR  ARTHRODESIS       DR HEWITT  . Hardware removal Right 03/31/2014    Procedure: REMOVAL OF DEEP IMPLANTS X 3  RIGHT ;  Surgeon: Wylene Simmer, MD;  Location: [facility];  Service: Orthopedics;  Laterality: Right;  . Arthrodesis tibiofibular Right 03/31/2014    Procedure: REVISION OF SUBTALOR ARTHRODESIS  RIGHT ;  Surgeon: Wylene Simmer, MD;  Location: [place];  Service: Orthopedics;  Laterality: Right;  . Video bronchoscopy N/A 05/03/2014    Procedure: VIDEO BRONCHOSCOPY;  Surgeon: [name], MD;  Location: [facility];  Service: Thoracic;  Laterality: N/A;  . Video assisted thoracoscopy (vats)/empyema Right 05/03/2014    Procedure: VIDEO ASSISTED THORACOSCOPY (VATS)/EMPYEMA;  Surgeon: [name], MD;  Location: [facility];  Service: Thoracic;  Laterality: Right;  . Decortication Right 05/03/2014    Procedure: DECORTICATION;  Surgeon: [name], MD;  Location: [facility];  Service: Thoracic;  Laterality: Right;  . Hardware removal Right 11/03/2014    Procedure: [place];  Surgeon: Wylene Simmer, MD;  Location: [facility];  Service: Orthopedics;  Laterality: Right;  . Bronchoscopy    . Eye surgery  2016    cateracts   Family History  Problem Relation Age of Onset  . Prostate cancer Brother   . Cancer Brother     PROSTATE  . Heart disease Mother   . Asthma Mother   . Congestive Heart Failure Mother   . Emphysema Sister   . COPD Sister   . Stroke Sister   . Heart disease Sister   . Emphysema Brother   . Rheumatologic disease Neg Hx    Social History   Occupational History  . Retired    Social History Main Topics  . Smoking status: Former Smoker -- 1.00 packs/day  for 30 years    Types: Cigarettes    Quit date: 12/16/1989  . Smokeless tobacco: Never Used     Comment: smoked off & on  . Alcohol Use: No  . Drug Use: No  . Sexual Activity: No    Do you feel safe at home?  Yes - but she is concerned about her grandson who was released from prison recently  Dietary issues and exercise activities: Patient's exercise is limited by her fractured foot.  Current Dietary habits:  Patient is not cooking as much because she cannot stand for extended periods of time.    Objective:    Today's Vitals   09/22/15 1430  BP: 142/82  Pulse: 63  Height: 5' (1.524 m)  Weight: 159 lb 8 oz (72.349 kg)  PainSc: 6   PainLoc: Foot   Body mass index is 31.15 kg/(m^2).  Activities of Daily Living In your present state of health, do you have any difficulty performing the following activities: 09/22/2015 11/03/2014  Hearing? N N  Vision? N N  Difficulty concentrating or making decisions? Y N  Walking or climbing stairs? Y N  Dressing or bathing? N N  Doing errands, shopping? N -  Preparing Food and eating ? N -  Using the Toilet? N -  In the past six months, have you accidently leaked urine? N -  Do you have problems with loss of bowel control? N -  Managing your Medications? N -  Managing your Finances? N -  Housekeeping or managing your Housekeeping? N -    Are there smokers in your home (other than you)? No   Cardiac Risk Factors include: advanced age (>41men, >12 women);dyslipidemia;hypertension;obesity (BMI >30kg/m2);sedentary lifestyle  Depression Screen PHQ 2/9 Scores 09/22/2015 02/06/2015 11/29/2014 09/20/2014  PHQ - 2 Score 3 0 2 0  PHQ- 9 Score 4 - 5 -    Fall Risk Fall Risk  09/22/2015 02/06/2015 09/20/2014 07/27/2014 06/20/2014  Falls in the past year? Yes Yes Yes No No  Number falls in past yr: 1 2 or more  1 - -  Injury with Fall? Yes No No - -  Risk Factor Category  High Fall Risk - - - -  Risk for fall due to : History of fall(s);Impaired  balance/gait;Impaired mobility - - Impaired balance/gait;Impaired mobility Impaired balance/gait  Follow up Falls evaluation completed;Falls prevention discussed - - - -    Cognitive Function: MMSE - Mini Mental State Exam 09/22/2015 08/09/2013  Orientation to time 5 5  Orientation to Place 5 5  Registration 3 3  Attention/ Calculation 4 5  Recall 3 1  Language- name 2 objects 2 2  Language- repeat 1 1  Language- follow 3 step command 3 3  Language- read & follow direction 1 1  Write a sentence 1 1  Copy design 1 1  Total score 29 28    Immunizations and Health Maintenance Immunization History  Administered Date(s) Administered  . Influenza Split 08/28/2012  . Influenza Whole 09/18/2009  . Influenza,inj,Quad PF,36+ Mos 10/05/2013  . Influenza,inj,quad, With Preservative 09/12/2014  . Pneumococcal Conjugate-13 01/13/2014  . Pneumococcal Polysaccharide-23 08/28/2012  . Tdap 09/10/2011   Health Maintenance Due  Topic Date Due  . INFLUENZA VACCINE  07/17/2015    Patient Care Team: Chipper Herb, MD as PCP - General Minus Breeding, MD as Consulting Physician (Cardiology) Mamie Laurel. [place], DPM as Financial risk analyst Physician (Podiatry) Irene Shipper, MD as Consulting Physician (Gastroenterology)  Indicate any recent Medical Services you may have received from other than [facility] providers in the past year (date may be approximate).    Assessment:    Annual Wellness Visit  Osteoporosis obesity High risk medications  Screening Tests Health Maintenance  Topic Date Due  . INFLUENZA VACCINE  07/17/2015  . DEXA SCAN  07/27/2016  . TETANUS/TDAP  08/16/2021  . ZOSTAVAX  Completed  . PNA vac Low Risk Adult  Completed        Plan:   During the course of the visit Alexxis was educated and counseled about the following appropriate screening and preventive services:   Vaccines to include Pneumoccal, Influenza, Hepatitis B, Td, Zostavax - patient received influenza vaccines today.   All other vaccines are UTD  Colorectal cancer screening - UTD on colonoscopy and FOCT  Cardiovascular disease screening - patient sees Dr Jenkins [place] regularly - next appt 01/2016.   Lipids - last LDL was at goal and patient is taking crestor  Patient is taking Xarelto for atrial fib and stroke prevention therefore ASA contraindicated  BP was slightly elevated today but much better than last visit.  Will recheck in 1 month at appt with PCP.    Discussed dietary changes to decrease calories and salt intake.   Discussed ways to prepare vegetables and recommended at least 5 fruits and vegetables daily.   Diabetes screening - last FBG was 82  Bone Denisty / Osteoporosis Screening - due to recheck 07/2016  Prolia 60mg  given SQ today - next due 03/2016  Continue calcium intake from diet and supplementation - goal is 1200mg  daily  Continue vitamin D intake  Mammogram - patient no longer gets   Glaucoma screening / Eye Exam - UTd  Advanced Directives - UTD  Discussed chair exercises and handout given since patient is unable to put much weight on her right foot.    Discussed weight and limiting caloric intake, food coices and serving sizes  Patient is to decrease alprazolam 0.5mg  to 1-2 tablets at bedtime and to try to taper off.  She is to start melatonin 2-5mg   at bedtime.      Patient Instructions (the written plan) were given to the patient.   Cherre Robins, [place], CPP, CDE  09/22/2015

## 2015-09-22 NOTE — Patient Instructions (Addendum)
Tylenol or acetaminophen 511m - do not take more then 6 tablets in 24 hours.    [name], Thank you for taking time to come for your Medicare Wellness Visit. I appreciate your ongoing commitment to your health goals. Please review the following plan we discussed and let me know if I can assist you in the future.   Try to do chair exercises at home daily   Increase non-starchy vegetables - carrots, green bean, squash, zucchini, tomatoes, onions, peppers, spinach and other green leafy vegetables, cabbage, lettuce, cucumbers, asparagus, okra (not fried), eggplant limit sugar and processed foods (cakes, cookies, ice cream, crackers and chips) Increase fresh fruit but limit serving sizes 1/2 cup or about the size of tennis or baseball limit red meat to no more than 1-2 times per week (serving size about the size of your palm) Choose whole grains / lean proteins - whole wheat bread, quinoa, whole grain rice (1/2 cup), fish, chicken, [place]  These are the goals we discussed:This is a list of the screening recommended for you and due dates:  Health Maintenance  Topic Date Due  . Flu Shot  NEXT FALL - RECEIVED TODAY  . DEXA scan (bone density measurement)  07/27/2016  . Tetanus Vaccine  08/16/2021  . Shingles Vaccine  Completed  . Pneumonia vaccines  Completed    Health Maintenance, Female Adopting a healthy lifestyle and getting preventive care can go a long way to promote health and wellness. Talk with your health care provider about what schedule of regular examinations is right for you. This is a good chance for you to check in with your provider about disease prevention and staying healthy. In between checkups, there are plenty of things you can do on your own. Experts have done a lot of research about which lifestyle changes and preventive measures are most likely to keep you healthy. Ask your health care provider for more information. WEIGHT AND DIET  Eat a healthy diet  Be sure to  include plenty of vegetables, fruits, low-fat dairy products, and lean protein.  Do not eat a lot of foods high in solid fats, added sugars, or salt.  Get regular exercise. This is one of the most important things you can do for your health.  Most adults should exercise for at least 150 minutes each week. The exercise should increase your heart rate and make you sweat (moderate-intensity exercise).  Most adults should also do strengthening exercises at least twice a week. This is in addition to the moderate-intensity exercise.  Maintain a healthy weight  Body mass index (BMI) is a measurement that can be used to identify possible weight problems. It estimates body fat based on height and weight. Your health care provider can help determine your BMI and help you achieve or maintain a healthy weight.  For females 258years of age and older:   A BMI below 18.5 is considered underweight.  A BMI of 18.5 to 24.9 is normal.  A BMI of 25 to 29.9 is considered overweight.  A BMI of 30 and above is considered obese.  Watch levels of cholesterol and blood lipids  You should start having your blood tested for lipids and cholesterol at 77years of age, then have this test every 5 years.  You may need to have your cholesterol levels checked more often if:  Your lipid or cholesterol levels are high.  You are older than 5100years of age.  You are at high risk  for heart disease.  CANCER SCREENING   Lung Cancer  Lung cancer screening is recommended for adults 61-30 years old who are at high risk for lung cancer because of a history of smoking.  A yearly low-dose CT scan of the lungs is recommended for people who:  Currently smoke.  Have quit within the past 15 years.  Have at least a 30-pack-year history of smoking. A pack year is smoking an average of one pack of cigarettes a day for 1 year.  Yearly screening should continue until it has been 15 years since you quit.  Yearly  screening should stop if you develop a health problem that would prevent you from having lung cancer treatment.  Breast Cancer  Practice breast self-awareness. This means understanding how your breasts normally appear and feel.  It also means doing regular breast self-exams. Let your health care provider know about any changes, no matter how small.  If you are in your 20s or 30s, you should have a clinical breast exam (CBE) by a health care provider every 1-3 years as part of a regular health exam.  If you are 63 or older, have a CBE every year. Also consider having a breast X-ray (mammogram) every year.  If you have a family history of breast cancer, talk to your health care provider about genetic screening.  If you are at high risk for breast cancer, talk to your health care provider about having an MRI and a mammogram every year.  Breast cancer gene (BRCA) assessment is recommended for women who have family members with BRCA-related cancers. BRCA-related cancers include:  Breast.  Ovarian.  Tubal.  Peritoneal cancers.  Results of the assessment will determine the need for genetic counseling and BRCA1 and BRCA2 testing. Cervical Cancer Your health care provider may recommend that you be screened regularly for cancer of the pelvic organs (ovaries, uterus, and vagina). This screening involves a pelvic examination, including checking for microscopic changes to the surface of your cervix (Pap test). You may be encouraged to have this screening done every 3 years, beginning at age 95.  For women ages 70-65, health care providers may recommend pelvic exams and Pap testing every 3 years, or they may recommend the Pap and pelvic exam, combined with testing for human papilloma virus (HPV), every 5 years. Some types of HPV increase your risk of cervical cancer. Testing for HPV may also be done on women of any age with unclear Pap test results.  Other health care providers may not recommend  any screening for nonpregnant women who are considered low risk for pelvic cancer and who do not have symptoms. Ask your health care provider if a screening pelvic exam is right for you.  If you have had past treatment for cervical cancer or a condition that could lead to cancer, you need Pap tests and screening for cancer for at least 20 years after your treatment. If Pap tests have been discontinued, your risk factors (such as having a new sexual partner) need to be reassessed to determine if screening should resume. Some women have medical problems that increase the chance of getting cervical cancer. In these cases, your health care provider may recommend more frequent screening and Pap tests. Colorectal Cancer  This type of cancer can be detected and often prevented.  Routine colorectal cancer screening usually begins at 77 years of age and continues through 77 years of age.  Your health care provider may recommend screening at an earlier age if  you have risk factors for colon cancer.  Your health care provider may also recommend using home test kits to check for hidden blood in the stool.  A small camera at the end of a tube can be used to examine your colon directly (sigmoidoscopy or colonoscopy). This is done to check for the earliest forms of colorectal cancer.  Routine screening usually begins at age 27.  Direct examination of the colon should be repeated every 5-10 years through 77 years of age. However, you may need to be screened more often if early forms of precancerous polyps or small growths are found. Skin Cancer  Check your skin from head to toe regularly.  Tell your health care provider about any new moles or changes in moles, especially if there is a change in a mole's shape or color.  Also tell your health care provider if you have a mole that is larger than the size of a pencil eraser.  Always use sunscreen. Apply sunscreen liberally and repeatedly throughout the  day.  Protect yourself by wearing long sleeves, pants, a wide-brimmed hat, and sunglasses whenever you are outside. HEART DISEASE, DIABETES, AND HIGH BLOOD PRESSURE   High blood pressure causes heart disease and increases the risk of stroke. High blood pressure is more likely to develop in:  People who have blood pressure in the high end of the normal range (130-139/85-89 mm Hg).  People who are overweight or obese.  People who are African American.  If you are 57-24 years of age, have your blood pressure checked every 3-5 years. If you are 15 years of age or older, have your blood pressure checked every year. You should have your blood pressure measured twice--once when you are at a hospital or clinic, and once when you are not at a hospital or clinic. Record the average of the two measurements. To check your blood pressure when you are not at a hospital or clinic, you can use:  An automated blood pressure machine at a pharmacy.  A home blood pressure monitor.  If you are between 69 years and 90 years old, ask your health care provider if you should take aspirin to prevent strokes.  Have regular diabetes screenings. This involves taking a blood sample to check your fasting blood sugar level.  If you are at a normal weight and have a low risk for diabetes, have this test once every three years after 77 years of age.  If you are overweight and have a high risk for diabetes, consider being tested at a younger age or more often. PREVENTING INFECTION  Hepatitis B  If you have a higher risk for hepatitis B, you should be screened for this virus. You are considered at high risk for hepatitis B if:  You were born in a country where hepatitis B is common. Ask your health care provider which countries are considered high risk.  Your parents were born in a high-risk country, and you have not been immunized against hepatitis B (hepatitis B vaccine).  You have HIV or AIDS.  You use needles  to inject street drugs.  You live with someone who has hepatitis B.  You have had sex with someone who has hepatitis B.  You get hemodialysis treatment.  You take certain medicines for conditions, including cancer, organ transplantation, and autoimmune conditions. Hepatitis C  Blood testing is recommended for:  Everyone born from [birth year] through 1963.  Anyone with known risk factors for hepatitis C. Sexually transmitted infections (  STIs)  You should be screened for sexually transmitted infections (STIs) including gonorrhea and chlamydia if:  You are sexually active and are younger than 77 years of age.  You are older than 77 years of age and your health care provider tells you that you are at risk for this type of infection.  Your sexual activity has changed since you were last screened and you are at an increased risk for chlamydia or gonorrhea. Ask your health care provider if you are at risk.  If you do not have HIV, but are at risk, it may be recommended that you take a prescription medicine daily to prevent HIV infection. This is called pre-exposure prophylaxis (PrEP). You are considered at risk if:  You are sexually active and do not regularly use condoms or know the HIV status of your partner(s).  You take drugs by injection.  You are sexually active with a partner who has HIV. Talk with your health care provider about whether you are at high risk of being infected with HIV. If you choose to begin PrEP, you should first be tested for HIV. You should then be tested every 3 months for as long as you are taking PrEP.  PREGNANCY   If you are premenopausal and you may become pregnant, ask your health care provider about preconception counseling.  If you may become pregnant, take 400 to 800 micrograms (mcg) of folic acid every day.  If you want to prevent pregnancy, talk to your health care provider about birth control (contraception). OSTEOPOROSIS AND MENOPAUSE    Osteoporosis is a disease in which the bones lose minerals and strength with aging. This can result in serious bone fractures. Your risk for osteoporosis can be identified using a bone density scan.  If you are 10 years of age or older, or if you are at risk for osteoporosis and fractures, ask your health care provider if you should be screened.  Ask your health care provider whether you should take a calcium or vitamin D supplement to lower your risk for osteoporosis.  Menopause may have certain physical symptoms and risks.  Hormone replacement therapy may reduce some of these symptoms and risks. Talk to your health care provider about whether hormone replacement therapy is right for you.  HOME CARE INSTRUCTIONS   Schedule regular health, dental, and eye exams.  Stay current with your immunizations.   Do not use any tobacco products including cigarettes, chewing tobacco, or electronic cigarettes.  If you are pregnant, do not drink alcohol.  If you are breastfeeding, limit how much and how often you drink alcohol.  Limit alcohol intake to no more than 1 drink per day for nonpregnant women. One drink equals 12 ounces of beer, 5 ounces of wine, or 1 ounces of hard liquor.  Do not use street drugs.  Do not share needles.  Ask your health care provider for help if you need support or information about quitting drugs.  Tell your health care provider if you often feel depressed.  Tell your health care provider if you have ever been abused or do not feel safe at home.   This information is not intended to replace advice given to you by your health care provider. Make sure you discuss any questions you have with your health care provider.   Document Released: 06/17/2011 Document Revised: 12/23/2014 Document Reviewed: 11/03/2013 Elsevier Interactive Patient Education [employer].

## 2015-09-25 ENCOUNTER — Ambulatory Visit (HOSPITAL_COMMUNITY)
Admission: RE | Admit: 2015-09-25 | Discharge: 2015-09-25 | Disposition: A | Discharge status: Home / Self Care | Payer: Medicare Other | Source: Ambulatory Visit | Attending: Urology | Admitting: Urology

## 2015-09-25 DIAGNOSIS — E7800 Pure hypercholesterolemia, unspecified: Secondary | ICD-10-CM | POA: Diagnosis not present

## 2015-09-25 DIAGNOSIS — Z780 Asymptomatic menopausal state: Secondary | ICD-10-CM | POA: Diagnosis not present

## 2015-09-25 DIAGNOSIS — N200 Calculus of kidney: Secondary | ICD-10-CM | POA: Insufficient documentation

## 2015-09-25 DIAGNOSIS — I10 Essential (primary) hypertension: Secondary | ICD-10-CM | POA: Diagnosis not present

## 2015-09-25 DIAGNOSIS — N952 Postmenopausal atrophic vaginitis: Secondary | ICD-10-CM

## 2015-09-25 DIAGNOSIS — N760 Acute vaginitis: Secondary | ICD-10-CM | POA: Diagnosis present

## 2015-10-09 ENCOUNTER — Telehealth: Payer: Self-pay | Admitting: Internal Medicine

## 2015-10-10 ENCOUNTER — Ambulatory Visit: Payer: Medicare Other | Admitting: Internal Medicine

## 2015-10-10 DIAGNOSIS — G8929 Other chronic pain: Secondary | ICD-10-CM | POA: Diagnosis not present

## 2015-10-10 DIAGNOSIS — M5442 Lumbago with sciatica, left side: Secondary | ICD-10-CM | POA: Diagnosis not present

## 2015-10-10 DIAGNOSIS — M19011 Primary osteoarthritis, right shoulder: Secondary | ICD-10-CM | POA: Diagnosis not present

## 2015-10-10 DIAGNOSIS — M19012 Primary osteoarthritis, left shoulder: Secondary | ICD-10-CM | POA: Diagnosis not present

## 2015-10-13 DIAGNOSIS — J449 Chronic obstructive pulmonary disease, unspecified: Secondary | ICD-10-CM | POA: Diagnosis not present

## 2015-10-18 ENCOUNTER — Ambulatory Visit: Payer: Medicare Other | Admitting: Pulmonary Disease

## 2015-10-20 ENCOUNTER — Other Ambulatory Visit: Payer: Self-pay | Admitting: Internal Medicine

## 2015-10-25 ENCOUNTER — Telehealth: Payer: Self-pay | Admitting: *Deleted

## 2015-10-25 NOTE — Telephone Encounter (Signed)
Requesting surgical clearance:   1. Type of surgery: Back Injection   2. Surgeon: Unknown  3. Surgical date: 11/01/15  4. Medications that need to be help: Xarelto  5. [facility]: Mamie [phone]   Pt saw you last 02/01/15, How long can pt be off Xarelto?

## 2015-10-26 NOTE — Telephone Encounter (Signed)
OK to hold Xarelto 5 days prior to back injection.

## 2015-10-27 DIAGNOSIS — Z961 Presence of intraocular lens: Secondary | ICD-10-CM | POA: Diagnosis not present

## 2015-10-27 NOTE — Telephone Encounter (Signed)
Patient called and notified of MD recommendations

## 2015-10-27 NOTE — Telephone Encounter (Signed)
Clearance printed and faxed.

## 2015-10-30 ENCOUNTER — Other Ambulatory Visit: Payer: Self-pay | Admitting: Family Medicine

## 2015-10-31 ENCOUNTER — Ambulatory Visit (INDEPENDENT_AMBULATORY_CARE_PROVIDER_SITE_OTHER): Payer: Medicare Other | Admitting: Family Medicine

## 2015-10-31 ENCOUNTER — Encounter: Payer: Self-pay | Admitting: Family Medicine

## 2015-10-31 VITALS — BP 134/67 | HR 68 | Temp 96.7°F | Ht 60.0 in | Wt 154.0 lb

## 2015-10-31 DIAGNOSIS — I480 Paroxysmal atrial fibrillation: Secondary | ICD-10-CM | POA: Diagnosis not present

## 2015-10-31 DIAGNOSIS — I10 Essential (primary) hypertension: Secondary | ICD-10-CM | POA: Diagnosis not present

## 2015-10-31 DIAGNOSIS — E039 Hypothyroidism, unspecified: Secondary | ICD-10-CM

## 2015-10-31 DIAGNOSIS — K219 Gastro-esophageal reflux disease without esophagitis: Secondary | ICD-10-CM

## 2015-10-31 DIAGNOSIS — E785 Hyperlipidemia, unspecified: Secondary | ICD-10-CM | POA: Diagnosis not present

## 2015-10-31 DIAGNOSIS — E559 Vitamin D deficiency, unspecified: Secondary | ICD-10-CM | POA: Diagnosis not present

## 2015-10-31 DIAGNOSIS — R319 Hematuria, unspecified: Secondary | ICD-10-CM | POA: Diagnosis not present

## 2015-10-31 DIAGNOSIS — N390 Urinary tract infection, site not specified: Secondary | ICD-10-CM | POA: Diagnosis not present

## 2015-10-31 MED ORDER — HYDROCODONE-ACETAMINOPHEN 5-325 MG PO TABS: 1.0000 | ORAL_TABLET | Freq: Four times a day (QID) | ORAL | Status: DC | PRN

## 2015-10-31 NOTE — Patient Instructions (Signed)
Medicare Annual Wellness Visit  [place] and the medical providers at [facility] strive to bring you the best medical care.  In doing so we not only want to address your current medical conditions and concerns but also to detect new conditions early and prevent illness, disease and health-related problems.    Medicare offers a yearly Wellness Visit which allows our clinical staff to assess your need for preventative services including immunizations, lifestyle education, counseling to decrease risk of preventable diseases and screening for fall risk and other medical concerns.    This visit is provided free of charge (no copay) for all Medicare recipients. The clinical pharmacists at [facility] have begun to conduct these Wellness Visits which will also include a thorough review of all your medications.    As you primary medical provider recommend that you make an appointment for your Annual Wellness Visit if you have not done so already this year.  You may set up this appointment before you leave today or you may call back ([phone]) and schedule an appointment.  Please make sure when you call that you mention that you are scheduling your Annual Wellness Visit with the clinical pharmacist so that the appointment may be made for the proper length of time.     Continue current medications. Continue good therapeutic lifestyle changes which include good diet and exercise. Fall precautions discussed with patient. If an FOBT was given today- please return it to our front desk. If you are over 50 years old - you may need Prevnar 13 or the adult Pneumonia vaccine.  **Flu shots are available--- please call and schedule a FLU-CLINIC appointment**  After your visit with us today you will receive a survey in the mail or online from [employer] regarding your care with us. Please take a moment to fill this out. Your feedback is very  important to us as you can help us better understand your patient needs as well as improve your experience and satisfaction. WE CARE ABOUT YOU!!!    

## 2015-10-31 NOTE — Progress Notes (Signed)
Subjective:    Patient ID: [name], female    DOB: 1936-05-22, 79 y.o.   MRN: [id number]  HPI Pt here for follow up and management of chronic medical problems of hypertension and hyperlipidemia and taking medications. The patient is complaining of some loose bowel movements. She is followed regularly by the cardiologist and the gastroenterologist. She is also followed by her podiatrist , Dr. Mother shed. She has had recent cataract surgery. The patient is pleasant and looks good today. She is followed regularly by the orthopedic surgeon the eye doctor the cardiologist and the urologist. She's had loose bowel movements in the morning for about 2 weeks. She may have a couple cups of coffee daily but does not drink any other caffeine. She continues to have problems with her right ankle and she is seeing an orthopedist/podiatrist that is getting ready to prescribe a brace for her ankle that is giving her some much trouble. She is due to get lab work today.   Patient Active Problem List   Diagnosis Date Noted  . UTI (urinary tract infection) 08/22/2015  . Cough 07/26/2015  . Diarrhea 11/30/2014  . Heme + stool 11/30/2014  . Chronic anticoagulation 11/30/2014  . CHF (congestive heart failure) ([place]) 04/30/2014  . 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 Osteoporosis 01/27/2014  . Fibromyalgia 08/09/2013  . Asthma 08/01/2010  . Hypothyroidism 03/20/2010  . DEPRESSION 03/20/2010  . HTN (hypertension) 03/20/2010  . ESOPHAGEAL MOTILITY DISORDER 03/20/2010  . Hyperlipidemia 04/22/2009  . Cor athrscl-uns vessel 04/22/2009  . ATRIAL FIBRILLATION, PAROXYSMAL 04/22/2009  . ESOPHAGEAL STRICTURE 10/31/2004  . GERD 10/31/2004  . HIATAL HERNIA 10/06/2001   Outpatient Encounter Prescriptions as of 10/31/2015  Medication Sig  . acetaminophen (TYLENOL) 500 MG tablet Take 500-1,000 mg by mouth every 8 (eight) hours as needed.  . ALPRAZolam (XANAX) 0.5 MG tablet Take 2 tablets (1 mg total) by  mouth at bedtime. Take 1 to 2 tablet by mouth at bedtime  . Ascorbic Acid (VITAMIN C PO) Take 1 tablet by mouth every morning.  . budesonide (PULMICORT) 0.5 MG/2ML nebulizer solution Take 2 mLs (0.5 mg total) by nebulization 2 (two) times daily. Dx 496  . Calcium Carbonate-Vitamin D (CALCIUM 600+D) 600-400 MG-UNIT per tablet Take 1 tablet by mouth daily at 6 PM.   . cholecalciferol (VITAMIN D) 1000 UNITS tablet Take 1,000 Units by mouth 2 (two) times daily with breakfast and lunch.   . CRESTOR 20 MG tablet TAKE 1 TABLET ONCE A DAY  . denosumab (PROLIA) 60 MG/ML SOLN injection Inject 60 mg into the skin every 6 (six) months. Administer in upper arm, thigh, or abdomen  . dextromethorphan-guaiFENesin (MUCINEX DM) 30-600 MG per 12 hr tablet Take 1 tablet by mouth 2 (two) times daily as needed. scheduled  . dicyclomine (BENTYL) 10 MG capsule TAKE (1) OR (2) CAPSULES EVERY SIX HOURS AS NEEDED FOR SPASM  . diltiazem (CARDIZEM CD) 120 MG 24 hr capsule Take 1 capsule (120 mg total) by mouth daily.  . fluticasone (FLONASE) 50 MCG/ACT nasal spray 2 SPRAYS IN EACH NOSTRIL ONCE A DAY  . HYDROcodone-acetaminophen (NORCO) 5-325 MG tablet Take 1 tablet by mouth every 6 (six) hours as needed for moderate pain.  Marland Kitchen levothyroxine (SYNTHROID, LEVOTHROID) 25 MCG tablet TAKE 2 TABLETS DAILY AS DIRECTED  . loratadine (CLARITIN) 10 MG tablet Take 10 mg by mouth daily.   . meclizine (ANTIVERT) 25 MG tablet Take 1 tablet (25 mg total) by mouth 3 (three) times  daily as needed for dizziness.  . Melatonin 3 MG TABS Take 1-2 tablets (3-6 mg total) by mouth at bedtime as needed.  . metoprolol tartrate (LOPRESSOR) 25 MG tablet Take 1 tablet (25 mg total) by mouth 2 (two) times daily.  . montelukast (SINGULAIR) 10 MG tablet TAKE 1 TABLET ONCE A DAY  . Multiple Vitamin (MULTIVITAMIN WITH MINERALS) TABS Take 1 tablet by mouth daily.  . nitroGLYCERIN (NITROSTAT) 0.4 MG SL tablet Place 1 tablet (0.4 mg total) under the tongue every 5  (five) minutes as needed for chest pain.  Marland Kitchen omega-3 acid ethyl esters (LOVAZA) 1 G capsule TAKE (2) CAPSULES TWICE DAILY.  Marland Kitchen omeprazole-sodium bicarbonate (ZEGERID) 40-1100 MG capsule TAKE (1) CAPSULE DAILY  . ondansetron (ZOFRAN) 4 MG tablet TAKE 1 TABLET EVERY 12 HOURS AS NEEDED FOR NAUSEA  . PROAIR HFA 108 (90 BASE) MCG/ACT inhaler USE 2 PUFFS EVERY 6 HOURS AS NEEDED FOR WHEEZING  . ranitidine (ZANTAC) 150 MG tablet Take 1 tablet (150 mg total) by mouth at bedtime.  . sertraline (ZOLOFT) 100 MG tablet TAKE 1 TABLET ONCE A DAY  . XARELTO 20 MG TABS tablet TAKE 1 TABLET ONCE A DAY  . [DISCONTINUED] HYDROcodone-acetaminophen (NORCO) 5-325 MG tablet Take 1 tablet by mouth every 6 (six) hours as needed for moderate pain.  Marland Kitchen ESTRACE VAGINAL 0.1 MG/GM vaginal cream Apply twice a week  . [DISCONTINUED] levothyroxine (SYNTHROID, LEVOTHROID) 25 MCG tablet Take 50 mcg by mouth daily before breakfast.   No facility-administered encounter medications on file as of 10/31/2015.       Review of Systems  Constitutional: Negative.   HENT: Negative.   Eyes: Negative.   Respiratory: Negative.   Cardiovascular: Negative.   Gastrointestinal: Positive for diarrhea.  Endocrine: Negative.   Genitourinary: Negative.   Musculoskeletal: Negative.   Skin: Negative.   Allergic/Immunologic: Negative.   Neurological: Negative.   Hematological: Negative.   Psychiatric/Behavioral: Negative.        Objective:   Physical Exam  Constitutional: She is oriented to person, place, and time. She appears well-developed and well-nourished. No distress.  HENT:  Head: Normocephalic and atraumatic.  Right Ear: External ear normal.  Left Ear: External ear normal.  Mouth/Throat: Oropharynx is clear and moist.  Nasal pallor  Eyes: Conjunctivae and EOM are normal. Pupils are equal, round, and reactive to light. Right eye exhibits no discharge. Left eye exhibits no discharge. No scleral icterus.  Neck: Normal range of  motion. Neck supple. No thyromegaly present.  No bruits or thyromegaly  Cardiovascular: Normal rate, regular rhythm, normal heart sounds and intact distal pulses.   No murmur heard. The heart is regular today at 72/m  Pulmonary/Chest: Effort normal and breath sounds normal. No respiratory distress. She has no wheezes. She has no rales. She exhibits no tenderness.  Clear anteriorly and posteriorly  Abdominal: Soft. Bowel sounds are normal. She exhibits no mass. There is tenderness. There is no rebound and no guarding.  There was slight left lower quadrant abdominal tenderness and slight epigastric tenderness.  Musculoskeletal: She exhibits no edema or tenderness.  The patient uses a cane for ambulation and she obviously has a right ankle that is somewhat deformed because of the multiple surgeries she has had.  Lymphadenopathy:    She has no cervical adenopathy.  Neurological: She is alert and oriented to person, place, and time. She has normal reflexes. No cranial nerve deficit.  Lower extremity reflexes were slightly decreased on the right  Skin: Skin is warm  and dry. No rash noted.  Psychiatric: She has a normal mood and affect. Her behavior is normal. Judgment and thought content normal.  Nursing note and vitals reviewed.  BP 134/67 mmHg  Pulse 68  Temp(Src) 96.7 F (35.9 C) (Oral)  Ht 5' (1.524 m)  Wt 154 lb (69.854 kg)  BMI 30.08 kg/m2        Assessment & Plan:  1. Hyperlipidemia -The patient will continue with current treatment pending results of lab work - CBC with Differential/Platelet - NMR, lipoprofile - Hepatic function panel  2. Essential hypertension -The blood pressure is good today and she will continue with current treatment - BMP8+EGFR - CBC with Differential/Platelet - Hepatic function panel  3. Hypothyroidism, unspecified hypothyroidism type -Continue with thyroid medication as doing pending results of lab work - CBC with Differential/Platelet  4.  Vitamin D deficiency -Continue with vitamin D replacement - CBC with Differential/Platelet - VITAMIN D 25 Hydroxy (Vit-D Deficiency, Fractures)  5. Paroxysmal atrial fibrillation (HCC) -Continue follow-up with cardiology and as of note the rhythm was regular today at 72/m - CBC with Differential/Platelet  6. Gastroesophageal reflux disease, esophagitis presence not specified -The patient is having minimal problems with her reflux but she does have some epigastric tenderness and she will continue with her omeprazole for the time being. - CBC with Differential/Platelet  7. Hematuria -She will continue follow-up with urology and with the estrogen cream treatment - HYDROcodone-acetaminophen (NORCO) 5-325 MG tablet; Take 1 tablet by mouth every 6 (six) hours as needed for moderate pain.  Dispense: 30 tablet; Refill: 0  8. Urinary tract infection with hematuria, site unspecified -And 10 you follow-up with urology - HYDROcodone-acetaminophen (NORCO) 5-325 MG tablet; Take 1 tablet by mouth every 6 (six) hours as needed for moderate pain.  Dispense: 30 tablet; Refill: 0  Meds ordered this encounter  Medications  . ESTRACE VAGINAL 0.1 MG/GM vaginal cream    Sig: Apply twice a week  . HYDROcodone-acetaminophen (NORCO) 5-325 MG tablet    Sig: Take 1 tablet by mouth every 6 (six) hours as needed for moderate pain.    Dispense:  30 tablet    Refill:  0   Patient Instructions                       Medicare Annual Wellness Visit  [facility] and the medical providers at [facility] strive to bring you the best medical care.  In doing so we not only want to address your current medical conditions and concerns but also to detect new conditions early and prevent illness, disease and health-related problems.    Medicare offers a yearly Wellness Visit which allows our clinical staff to assess your need for preventative services including immunizations, lifestyle education,  counseling to decrease risk of preventable diseases and screening for fall risk and other medical concerns.    This visit is provided free of charge (no copay) for all Medicare recipients. The clinical pharmacists at [facility] have begun to conduct these Wellness Visits which will also include a thorough review of all your medications.    As you primary medical provider recommend that you make an appointment for your Annual Wellness Visit if you have not done so already this year.  You may set up this appointment before you leave today or you may call back ([phone]) and schedule an appointment.  Please make sure when you call that you mention that you are scheduling  your Annual Wellness Visit with the clinical pharmacist so that the appointment may be made for the proper length of time.     Continue current medications. Continue good therapeutic lifestyle changes which include good diet and exercise. Fall precautions discussed with patient. If an FOBT was given today- please return it to our front desk. If you are over 56 years old - you may need Prevnar 25 or the adult Pneumonia vaccine.  **Flu shots are available--- please call and schedule a FLU-CLINIC appointment**  After your visit with [place] today you will receive a survey in the mail or online from [employer] regarding your care with [place]. Please take a moment to fill this out. Your feedback is very important to [place] as you can help [place] better understand your patient needs as well as improve your experience and satisfaction. WE CARE ABOUT YOU!!!      Arrie Senate MD

## 2015-11-01 DIAGNOSIS — M5416 Radiculopathy, lumbar region: Secondary | ICD-10-CM | POA: Diagnosis not present

## 2015-11-01 LAB — CBC WITH DIFFERENTIAL/PLATELET
Basophils Absolute: 0 10*3/uL (ref 0.0–0.2)
Basos: 1 %
EOS (ABSOLUTE): 0.1 10*3/uL (ref 0.0–0.4)
Eos: 2 %
Hematocrit: 39.1 % (ref 34.0–46.6)
Hemoglobin: 13.6 g/dL (ref 11.1–15.9)
Immature Grans (Abs): 0 10*3/uL (ref 0.0–0.1)
Immature Granulocytes: 0 %
Lymphocytes Absolute: 2.8 10*3/uL (ref 0.7–3.1)
Lymphs: 44 %
MCH: 32.3 pg (ref 26.6–33.0)
MCHC: 34.8 g/dL (ref 31.5–35.7)
MCV: 93 fL (ref 79–97)
Monocytes Absolute: 0.5 10*3/uL (ref 0.1–0.9)
Monocytes: 8 %
Neutrophils Absolute: 2.9 10*3/uL (ref 1.4–7.0)
Neutrophils: 45 %
Platelets: 270 10*3/uL (ref 150–379)
RBC: 4.21 x10E6/uL (ref 3.77–5.28)
RDW: 14.1 % (ref 12.3–15.4)
WBC: 6.3 10*3/uL (ref 3.4–10.8)

## 2015-11-01 LAB — NMR, LIPOPROFILE
Cholesterol: 171 mg/dL (ref 100–199)
HDL Cholesterol by NMR: 70 mg/dL (ref >39)
HDL Particle Number: 37.4 umol/L (ref >=30.5)
LDL Particle Number: 887 nmol/L (ref <1000)
LDL Size: 21.1 nm (ref >20.5)
LDL-C: 78 mg/dL (ref 0–99)
LP-IR Score: 29 (ref <=45)
Small LDL Particle Number: 321 nmol/L (ref <=527)
Triglycerides by NMR: 115 mg/dL (ref 0–149)

## 2015-11-01 LAB — BMP8+EGFR
BUN/Creatinine Ratio: 17 (ref 11–26)
BUN: 11 mg/dL (ref 8–27)
CO2: 26 mmol/L (ref 18–29)
Calcium: 9.5 mg/dL (ref 8.7–10.3)
Chloride: 98 mmol/L (ref 97–106)
Creatinine, Ser: 0.65 mg/dL (ref 0.57–1.00)
GFR calc Af Amer: 99 mL/min/{1.73_m2} (ref >59)
GFR calc non Af Amer: 86 mL/min/{1.73_m2} (ref >59)
Glucose: 81 mg/dL (ref 65–99)
Potassium: 4.7 mmol/L (ref 3.5–5.2)
Sodium: 140 mmol/L (ref 136–144)

## 2015-11-01 LAB — HEPATIC FUNCTION PANEL
ALT: 13 IU/L (ref 0–32)
AST: 16 IU/L (ref 0–40)
Albumin: 4.8 g/dL (ref 3.5–4.8)
Alkaline Phosphatase: 111 IU/L (ref 39–117)
Bilirubin Total: 0.4 mg/dL (ref 0.0–1.2)
Bilirubin, Direct: 0.13 mg/dL (ref 0.00–0.40)
Total Protein: 7.6 g/dL (ref 6.0–8.5)

## 2015-11-01 LAB — VITAMIN D 25 HYDROXY (VIT D DEFICIENCY, FRACTURES): Vit D, 25-Hydroxy: 48.4 ng/mL (ref 30.0–100.0)

## 2015-11-02 ENCOUNTER — Telehealth: Payer: Self-pay

## 2015-11-02 NOTE — Progress Notes (Signed)
Patient informed. 

## 2015-11-02 NOTE — Telephone Encounter (Signed)
Patient wants to let you know that she is taking CoQ10 100 mg daily and Zantac 150 mg at bedtime.  Just wanted you to be aware.

## 2015-11-10 ENCOUNTER — Telehealth: Payer: Self-pay | Admitting: Family Medicine

## 2015-11-10 NOTE — Telephone Encounter (Signed)
Patient states that she has found her medication.

## 2015-11-15 DIAGNOSIS — M21171 Varus deformity, not elsewhere classified, right ankle: Secondary | ICD-10-CM | POA: Diagnosis not present

## 2015-11-15 DIAGNOSIS — R269 Unspecified abnormalities of gait and mobility: Secondary | ICD-10-CM | POA: Diagnosis not present

## 2015-11-15 DIAGNOSIS — M12571 Traumatic arthropathy, right ankle and foot: Secondary | ICD-10-CM | POA: Diagnosis not present

## 2015-11-20 ENCOUNTER — Other Ambulatory Visit: Payer: Self-pay | Admitting: Family Medicine

## 2015-11-20 ENCOUNTER — Telehealth: Payer: Self-pay | Admitting: Family Medicine

## 2015-11-20 ENCOUNTER — Other Ambulatory Visit: Payer: Self-pay | Admitting: Pulmonary Disease

## 2015-11-20 DIAGNOSIS — M797 Fibromyalgia: Secondary | ICD-10-CM

## 2015-11-20 DIAGNOSIS — M810 Age-related osteoporosis without current pathological fracture: Secondary | ICD-10-CM

## 2015-11-20 NOTE — Telephone Encounter (Signed)
Patient last seen in office on 10-31-15. Rx last filled on 09-22-15 for #60 with 1 RF. Please advise. If approved please route to Pool A so nurse can phone in to pharmacy

## 2015-11-20 NOTE — Telephone Encounter (Signed)
Patient aware that rx is ready to be picked up.  

## 2015-11-20 NOTE — Telephone Encounter (Signed)
This is okay to refill 

## 2015-11-21 ENCOUNTER — Ambulatory Visit: Payer: Medicare Other | Admitting: Internal Medicine

## 2015-11-21 DIAGNOSIS — M19279 Secondary osteoarthritis, unspecified ankle and foot: Secondary | ICD-10-CM | POA: Diagnosis not present

## 2015-11-21 NOTE — Telephone Encounter (Signed)
RA pt.

## 2015-11-21 NOTE — Telephone Encounter (Signed)
rx called to pharmacy 

## 2015-11-22 ENCOUNTER — Encounter: Payer: Self-pay | Admitting: Gastroenterology

## 2015-11-22 ENCOUNTER — Ambulatory Visit (INDEPENDENT_AMBULATORY_CARE_PROVIDER_SITE_OTHER): Payer: Medicare Other | Admitting: Gastroenterology

## 2015-11-22 VITALS — BP 122/80 | HR 64 | Ht 60.0 in | Wt 158.1 lb

## 2015-11-22 DIAGNOSIS — K219 Gastro-esophageal reflux disease without esophagitis: Secondary | ICD-10-CM | POA: Diagnosis not present

## 2015-11-22 DIAGNOSIS — K580 Irritable bowel syndrome with diarrhea: Secondary | ICD-10-CM

## 2015-11-22 NOTE — Patient Instructions (Addendum)
Start taking Benefiber daily.  Follow-up with Dr. Henrene Pastor in approximately 8 weeks.

## 2015-12-07 DIAGNOSIS — M5442 Lumbago with sciatica, left side: Secondary | ICD-10-CM | POA: Diagnosis not present

## 2015-12-07 DIAGNOSIS — M5136 Other intervertebral disc degeneration, lumbar region: Secondary | ICD-10-CM | POA: Diagnosis not present

## 2015-12-07 DIAGNOSIS — G8929 Other chronic pain: Secondary | ICD-10-CM | POA: Diagnosis not present

## 2015-12-09 DIAGNOSIS — R69 Illness, unspecified: Secondary | ICD-10-CM | POA: Diagnosis not present

## 2015-12-11 ENCOUNTER — Encounter: Payer: Self-pay | Admitting: Gastroenterology

## 2015-12-11 DIAGNOSIS — K580 Irritable bowel syndrome with diarrhea: Secondary | ICD-10-CM | POA: Insufficient documentation

## 2015-12-11 NOTE — Progress Notes (Signed)
Agree with initial assessment and plans 

## 2015-12-11 NOTE — Progress Notes (Signed)
     11/22/2015 [place] [id number] [birth year]   History of Present Illness:  This is a 79 year old female who is known to Dr. Henrene Pastor.  Her last colonoscopy was 12/2014 at which time she was found to have 2 adenomatous polyps, 2 mm in size, that were removed.  Also had moderate diverticulosis.  No colonoscopy recall due to age.  Last EGD was 04/2009 and was normal but 16 mm Savary dilator was passed for suspected hypertensive LES.  Biopsies from the GE junction showed only mild inflammation c/w reflux, but no Barrett's.  The patient presents to our office today with complaints of diarrhea.  She had complaints of diarrhea prior to her last colonoscopy as well and it seems that her symptoms are actually similar as to what she had back then.  Complains of diarrhea 3-4 times per day, mostly in the morning, for the past month or so.  She then takes Imodium, which makes her constipated and then she takes laxatives.  Had some abdominal cramping and some nausea but that has been better recently.  She also has reflux for which she takes Zegerid daily.  Dr. Elsworth Soho just prescribed Zantac 150 mg at bedtime as well, but has not started taking that yet.  CBC, BMP, and hepatic function panel were all normal recently.  Thyroid studies normal in 01/2015.   Current Medications, Allergies, Past Medical History, Past Surgical History, Family History and Social History were reviewed in [employer] record.   Physical Exam: BP 122/80 mmHg  Pulse 64  Ht 5' (1.524 m)  Wt 158 lb 2 oz (71.725 kg)  BMI 30.88 kg/m2 General: Well developed white female in no acute distress Head: Normocephalic and atraumatic Eyes:  Sclerae anicteric, conjunctiva pink  Ears: Normal auditory acuity Lungs: Clear throughout to auscultation Heart: Regular rate and rhythm Abdomen: Soft, non-distended.  Normal bowel sounds.  Non-tender. Musculoskeletal: Symmetrical with no gross deformities  Extremities: No edema    Neurological: Alert oriented x 4, grossly non-focal Psychological:  Alert and cooperative. Normal mood and affect  Assessment and Recommendations: -Diarrhea:  Intermittent but has bouts of worsening symptoms for periods of time.  She likely has IBS with her anxiety, and chases her diarrhea back and forth with anti-diarrheals and laxatives.  I have asked her to start taking a fiber supplement daily such as Benefiber, Metamucil, or Citrucel to hopefully help bulk her stools.  Continue Bentyl prn. -GERD:  Continue Zegerid daily and zantac 150 mg at bedtime.

## 2015-12-20 ENCOUNTER — Other Ambulatory Visit: Payer: Self-pay | Admitting: Cardiology

## 2015-12-20 ENCOUNTER — Other Ambulatory Visit: Payer: Self-pay | Admitting: Family Medicine

## 2015-12-20 ENCOUNTER — Other Ambulatory Visit: Payer: Self-pay | Admitting: Pulmonary Disease

## 2015-12-20 NOTE — Telephone Encounter (Signed)
Rx request sent to pharmacy.  

## 2015-12-26 ENCOUNTER — Ambulatory Visit: Payer: Medicare Other | Admitting: Urology

## 2016-01-08 ENCOUNTER — Telehealth: Payer: Self-pay | Admitting: Cardiology

## 2016-01-08 NOTE — Telephone Encounter (Signed)
Will forward for dr hochrein's review 

## 2016-01-08 NOTE — Telephone Encounter (Signed)
Deana is calling to see if Mrs. Demarest can come off her Xarelto 3 days prior to her injection on 01/23/16. Please call  Thanks

## 2016-01-09 ENCOUNTER — Ambulatory Visit: Payer: Medicare Other | Admitting: Urology

## 2016-01-11 NOTE — Telephone Encounter (Signed)
The patient can come off of Xarelto as needed for the procedure.

## 2016-01-12 NOTE — Telephone Encounter (Signed)
Spoke with deanna, will fax this note to [phone].

## 2016-01-16 DIAGNOSIS — M7741 Metatarsalgia, right foot: Secondary | ICD-10-CM | POA: Diagnosis not present

## 2016-01-16 DIAGNOSIS — M24575 Contracture, left foot: Secondary | ICD-10-CM | POA: Diagnosis not present

## 2016-01-16 DIAGNOSIS — M21171 Varus deformity, not elsewhere classified, right ankle: Secondary | ICD-10-CM | POA: Diagnosis not present

## 2016-01-16 DIAGNOSIS — M12571 Traumatic arthropathy, right ankle and foot: Secondary | ICD-10-CM | POA: Diagnosis not present

## 2016-01-16 DIAGNOSIS — R269 Unspecified abnormalities of gait and mobility: Secondary | ICD-10-CM | POA: Diagnosis not present

## 2016-01-18 ENCOUNTER — Other Ambulatory Visit: Payer: Self-pay | Admitting: Cardiology

## 2016-01-18 ENCOUNTER — Other Ambulatory Visit: Payer: Self-pay | Admitting: Family Medicine

## 2016-01-18 NOTE — Telephone Encounter (Signed)
[facility] pt. Last filled 12/20/15, last seen 10/31/15. Call in at [place]

## 2016-01-18 NOTE — Telephone Encounter (Signed)
Rx(s) sent to pharmacy electronically.  

## 2016-01-23 DIAGNOSIS — M5136 Other intervertebral disc degeneration, lumbar region: Secondary | ICD-10-CM | POA: Diagnosis not present

## 2016-01-24 ENCOUNTER — Encounter: Payer: Self-pay | Admitting: Cardiology

## 2016-01-24 ENCOUNTER — Ambulatory Visit (INDEPENDENT_AMBULATORY_CARE_PROVIDER_SITE_OTHER): Payer: Medicare Other | Admitting: Cardiology

## 2016-01-24 VITALS — BP 145/76 | HR 70 | Ht 60.0 in | Wt 153.0 lb

## 2016-01-24 DIAGNOSIS — G4730 Sleep apnea, unspecified: Secondary | ICD-10-CM | POA: Diagnosis not present

## 2016-01-24 DIAGNOSIS — I10 Essential (primary) hypertension: Secondary | ICD-10-CM | POA: Diagnosis not present

## 2016-01-24 NOTE — Patient Instructions (Signed)
Medication Instructions:  The current medical regimen is effective;  continue present plan and medications.  Your physician has recommended that you have a sleep study. This test records several body functions during sleep, including: brain activity, eye movement, oxygen and carbon dioxide blood levels, heart rate and rhythm, breathing rate and rhythm, the flow of air through your mouth and nose, snoring, body muscle movements, and chest and belly movement.  Follow-Up: Follow up in 1 year with Dr. Percival Spanish in [place].  You will receive a letter in the mail 2 months before you are due.  Please call us when you receive this letter to schedule your follow up appointment.  Thank you for choosing [facility]!!

## 2016-01-24 NOTE — Progress Notes (Signed)
HPI The patient presents for followup of known coronary disease and atrial fib.  Since I last saw her she has done well.  The patient denies any new symptoms such as chest discomfort, neck or arm discomfort. There has been no new shortness of breath, PND or orthopnea. There have been no reported palpitations, presyncope or syncope.  She does have snoring, hypersomnolence during the day and headaches.  She is very fatigued.    She has ankle problems with recurrent surgeries and gets around with a cane.    Allergies  Allergen Reactions  . Aspirin Other (See Comments)    REACTION: regular strength causes "heart to beat fast"  . Captopril Hypertension  . Naproxen Other (See Comments)    Tongue swelling  . Penicillins     Swelling around site  . Sulfonamide Derivatives Hives  . Clindamycin/Lincomycin     Current Outpatient Prescriptions  Medication Sig Dispense Refill  . acetaminophen (TYLENOL) 500 MG tablet Take 500-1,000 mg by mouth every 8 (eight) hours as needed.    . ALLERGY RELIEF 10 MG tablet TAKE 1 TABLET ONCE A DAY 30 tablet 9  . ALPRAZolam (XANAX) 0.5 MG tablet TAKE 2 TABLETS AT BEDTIME 60 tablet 1  . Ascorbic Acid (VITAMIN C PO) Take 1 tablet by mouth every morning.    . budesonide (PULMICORT) 0.5 MG/2ML nebulizer solution Take 2 mLs (0.5 mg total) by nebulization 2 (two) times daily. Dx 496 120 mL 6  . Calcium Carbonate-Vitamin D (CALCIUM 600+D) 600-400 MG-UNIT per tablet Take 1 tablet by mouth daily at 6 PM.     . cholecalciferol (VITAMIN D) 1000 UNITS tablet Take 1,000 Units by mouth 2 (two) times daily with breakfast and lunch.     . Coenzyme Q10 (COQ10) 100 MG CAPS Take 100 mg by mouth daily.    . CRESTOR 20 MG tablet TAKE 1 TABLET ONCE A DAY 30 tablet 4  . denosumab (PROLIA) 60 MG/ML SOLN injection Inject 60 mg into the skin every 6 (six) months. Administer in upper arm, thigh, or abdomen 1 mL 0  . dextromethorphan-guaiFENesin (MUCINEX DM) 30-600 MG per 12 hr tablet Take  1 tablet by mouth 2 (two) times daily as needed. scheduled    . dicyclomine (BENTYL) 10 MG capsule TAKE (1) OR (2) CAPSULES EVERY SIX HOURS AS NEEDED FOR SPASM 60 capsule 3  . diltiazem (CARDIZEM CD) 120 MG 24 hr capsule TAKE (1) CAPSULE DAILY 30 capsule 2  . ESTRACE VAGINAL 0.1 MG/GM vaginal cream Apply twice a week    . fluticasone (FLONASE) 50 MCG/ACT nasal spray 2 SPRAYS IN EACH NOSTRIL ONCE A DAY 16 g 3  . HYDROcodone-acetaminophen (NORCO) 5-325 MG tablet Take 1 tablet by mouth every 6 (six) hours as needed for moderate pain. 30 tablet 0  . levothyroxine (SYNTHROID, LEVOTHROID) 25 MCG tablet TAKE 2 TABLETS DAILY AS DIRECTED 60 tablet 5  . loratadine (CLARITIN) 10 MG tablet Take 10 mg by mouth daily.     . meclizine (ANTIVERT) 25 MG tablet Take 1 tablet (25 mg total) by mouth 3 (three) times daily as needed for dizziness. 30 tablet 0  . Melatonin 3 MG TABS Take 1-2 tablets (3-6 mg total) by mouth at bedtime as needed.  0  . metoprolol tartrate (LOPRESSOR) 25 MG tablet Take 1 tablet (25 mg total) by mouth 2 (two) times daily. PLEASE CONTACT OFFICE FOR ADDITIONAL REFILLS 60 tablet 3  . montelukast (SINGULAIR) 10 MG tablet TAKE 1 TABLET ONCE A  DAY 30 tablet 9  . Multiple Vitamin (MULTIVITAMIN WITH MINERALS) TABS Take 1 tablet by mouth daily.    . nitroGLYCERIN (NITROSTAT) 0.4 MG SL tablet Place 1 tablet (0.4 mg total) under the tongue every 5 (five) minutes as needed for chest pain. 30 tablet 1  . omega-3 acid ethyl esters (LOVAZA) 1 G capsule TAKE (2) CAPSULES TWICE DAILY. 120 capsule 4  . omeprazole-sodium bicarbonate (ZEGERID) 40-1100 MG capsule TAKE (1) CAPSULE DAILY 30 capsule 6  . ondansetron (ZOFRAN) 4 MG tablet TAKE 1 TABLET EVERY 12 HOURS AS NEEDED FOR NAUSEA 20 tablet 1  . PROAIR HFA 108 (90 BASE) MCG/ACT inhaler USE 2 PUFFS EVERY 6 HOURS AS NEEDED FOR WHEEZING 8.5 g 5  . sertraline (ZOLOFT) 100 MG tablet TAKE 1 TABLET ONCE A DAY 30 tablet 2  . XARELTO 20 MG TABS tablet TAKE 1 TABLET  ONCE A DAY 30 tablet 2   No current facility-administered medications for this visit.    Past Medical History  Diagnosis Date  . GERD (gastroesophageal reflux disease)   . Anxiety disorder   . Arthritis   . CAD (coronary artery disease)     Stent to RI 2003.  Myoview 2013 no ischemia.  . Hypothyroid   . Asthmatic bronchitis   . Hypertension   . OA (osteoarthritis)   . Chronic bronchitis ([place])   . 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 Encephalitis     d/t meningitis  . PONV (postoperative nausea and vomiting)     history of cardiac arrest day 1 post surgery  in 2008  . Esophageal motility disorder   . Atrial fibrillation ([place])   . Hyperlipidemia   . Fibromyalgia   . Vitamin D deficiency   . Status post dilation of esophageal narrowing   . Bowel obstruction (HCC)     blockage  . Anxiety   . Congestive heart disease (HCC)     Preserved EF  . Pleural effusion on right   . Cataract   . Colon polyp     adenomatous  . Nephrolithiasis     Past Surgical History  Procedure Laterality Date  . Total knee arthroplasty Right   . Back surgery    . Coronary angioplasty with stent placement  2003  . Sinus surgery with instatrak    . Knee arthroscopy  03/26/2012    Procedure: ARTHROSCOPY KNEE;  Surgeon: Wylene Simmer, MD;  Location: [facility];  Service: Orthopedics;  Laterality: Left;  with Debridement of Lateral Meniscus tear  . Rotator cuff repair Bilateral   . Ankle fusion  08/27/2012    Procedure: ARTHRODESIS ANKLE;  Surgeon: Wylene Simmer, MD;  Location: [facility];  Service: Orthopedics;  Laterality: Right;  Arthrodesis right ankle and subtalar joint  . Foot arthrodesis, subtalar Right 2013  . Removal of implant Right 03/31/2014    DR HEWITT  . Hardware revision  03/31/2014    SUBTALOR  ARTHRODESIS       DR HEWITT  . Hardware removal Right 03/31/2014    Procedure: REMOVAL OF DEEP IMPLANTS X 3  RIGHT ;  Surgeon: Wylene Simmer, MD;  Location: [facility];  Service:  Orthopedics;  Laterality: Right;  . Arthrodesis tibiofibular Right 03/31/2014    Procedure: REVISION OF SUBTALOR ARTHRODESIS  RIGHT ;  Surgeon: Wylene Simmer, MD;  Location: [facility];  Service: Orthopedics;  Laterality: Right;  . Video bronchoscopy N/A 05/03/2014    Procedure: VIDEO BRONCHOSCOPY;  Surgeon: Percell Miller  Maryruth Bun, MD;  Location: [facility];  Service: Thoracic;  Laterality: N/A;  . Video assisted thoracoscopy (vats)/empyema Right 05/03/2014    Procedure: VIDEO ASSISTED THORACOSCOPY (VATS)/EMPYEMA;  Surgeon: [name], MD;  Location: [facility];  Service: Thoracic;  Laterality: Right;  . Decortication Right 05/03/2014    Procedure: DECORTICATION;  Surgeon: [name], MD;  Location: [facility];  Service: Thoracic;  Laterality: Right;  . Hardware removal Right 11/03/2014    Procedure: HARDWARE REMOVAL OF SUBTALAR JOINT;  Surgeon: Wylene Simmer, MD;  Location: [facility];  Service: Orthopedics;  Laterality: Right;  . Bronchoscopy    . Eye surgery  2016    cateracts    ROS: Reflux.  Otherwise as stated in the HPI and negative for all other systems.  PHYSICAL EXAM BP 145/76 mmHg  Pulse 70  Ht 5' (1.524 m)  Wt 153 lb (69.4 kg)  BMI 29.88 kg/m2  SpO2 94% GENERAL:  Well appearing NECK:  No jugular venous distention, waveform within normal limits, carotid upstroke brisk and symmetric, no bruits, no thyromegaly LUNGS: decreased breath sounds with expiratory wheezes. BACK:  No CVA tenderness HEART:  PMI not displaced or sustained,S1 and S2 within normal limits, no S3, no S4, no clicks, no rubs, no murmurs ABD:  Flat, positive bowel sounds normal in frequency in pitch, no bruits, no rebound, no guarding, no midline pulsatile mass, no hepatomegaly, no splenomegaly EXT:  2 plus pulses throughout, no edema, no cyanosis no clubbing, right ankle swelling.    EKG:  Sinus rhythm, rate 69, axis within normal limits, intervals within normal limits, no acute ST-T wave changes  01/24/2016  ASSESSMENT AND PLAN  CAD:  The patient has had no new symptoms since a negative stress perfusion in 2013.    No further testing is planned.   ATRIAL FIBRILLATION:  She had paroxysmal atrial fibrillation when she was acutely ill. She tolerates anticoagulation. No change in therapy is indicated. She continues to have an indication for anticoagulation.  Ms. SKYLEIGH JOVEL has a CHA2DS2 - VASc score of 4 with a risk of stroke of 4%.  HTN:  The blood pressure is at target. No change in medications is indicated. We will continue with therapeutic lifestyle changes (TLC).  DIASTOLIC HF: She seems to be euvolemic.  No change in therapy is indicated.   SNORING: I will set her up for a sleep study.

## 2016-01-25 ENCOUNTER — Telehealth: Payer: Self-pay | Admitting: Family Medicine

## 2016-01-25 ENCOUNTER — Encounter: Payer: Self-pay | Admitting: Cardiology

## 2016-01-25 NOTE — Telephone Encounter (Signed)
Spoke with pharmacy and dx codes given

## 2016-02-02 ENCOUNTER — Ambulatory Visit: Payer: Medicare Other | Admitting: Internal Medicine

## 2016-02-05 ENCOUNTER — Telehealth: Payer: Self-pay | Admitting: Cardiology

## 2016-02-05 NOTE — Telephone Encounter (Signed)
Was told by her insurance that she has to have pre-authorization for sleep study from our doctors.    Would like to speak with someone in reference to this

## 2016-02-05 NOTE — Telephone Encounter (Signed)
Called patient and advised her no prior authorization is required under the new [facility] MCR prior auth reduction program. She appreciated the call back and info.

## 2016-02-15 ENCOUNTER — Other Ambulatory Visit: Payer: Self-pay | Admitting: Family Medicine

## 2016-02-27 ENCOUNTER — Other Ambulatory Visit: Payer: Self-pay | Admitting: Family

## 2016-02-28 NOTE — Telephone Encounter (Signed)
Last seen 10/31/15  DWM

## 2016-03-05 ENCOUNTER — Telehealth: Payer: Self-pay | Admitting: Cardiology

## 2016-03-05 NOTE — Telephone Encounter (Signed)
Sharen Hint is calling to see whether [name] can come off of her Xarelto 3 days prior to a back injection . Please call   Thanks

## 2016-03-05 NOTE — Telephone Encounter (Signed)
Last seen 01/24/16 On Xarelto for PAF CHADSVASC score of 4   Forwarding call from [facility] to Dr. Percival Spanish for review.

## 2016-03-06 NOTE — Telephone Encounter (Signed)
She should come off of Xarelto at least 5 days prior to back injection.

## 2016-03-06 NOTE — Telephone Encounter (Signed)
Returned Deanna's call, sent note of clearance to fax number provided.

## 2016-03-07 ENCOUNTER — Encounter: Payer: Self-pay | Admitting: Family Medicine

## 2016-03-07 ENCOUNTER — Ambulatory Visit (INDEPENDENT_AMBULATORY_CARE_PROVIDER_SITE_OTHER): Payer: Medicare Other | Admitting: Family Medicine

## 2016-03-07 VITALS — BP 129/76 | HR 70 | Temp 96.9°F | Ht 60.0 in | Wt 154.0 lb

## 2016-03-07 DIAGNOSIS — M797 Fibromyalgia: Secondary | ICD-10-CM

## 2016-03-07 DIAGNOSIS — E559 Vitamin D deficiency, unspecified: Secondary | ICD-10-CM | POA: Diagnosis not present

## 2016-03-07 DIAGNOSIS — I480 Paroxysmal atrial fibrillation: Secondary | ICD-10-CM | POA: Diagnosis not present

## 2016-03-07 DIAGNOSIS — Z1211 Encounter for screening for malignant neoplasm of colon: Secondary | ICD-10-CM | POA: Diagnosis not present

## 2016-03-07 DIAGNOSIS — K219 Gastro-esophageal reflux disease without esophagitis: Secondary | ICD-10-CM | POA: Diagnosis not present

## 2016-03-07 DIAGNOSIS — F32A Depression, unspecified: Secondary | ICD-10-CM

## 2016-03-07 DIAGNOSIS — E785 Hyperlipidemia, unspecified: Secondary | ICD-10-CM

## 2016-03-07 DIAGNOSIS — I509 Heart failure, unspecified: Secondary | ICD-10-CM

## 2016-03-07 DIAGNOSIS — E039 Hypothyroidism, unspecified: Secondary | ICD-10-CM | POA: Diagnosis not present

## 2016-03-07 DIAGNOSIS — F329 Major depressive disorder, single episode, unspecified: Secondary | ICD-10-CM | POA: Diagnosis not present

## 2016-03-07 DIAGNOSIS — I10 Essential (primary) hypertension: Secondary | ICD-10-CM

## 2016-03-07 NOTE — Patient Instructions (Addendum)
Medicare Annual Wellness Visit  [facility] and the medical providers at [facility] strive to bring you the best medical care.  In doing so we not only want to address your current medical conditions and concerns but also to detect new conditions early and prevent illness, disease and health-related problems.    Medicare offers a yearly Wellness Visit which allows our clinical staff to assess your need for preventative services including immunizations, lifestyle education, counseling to decrease risk of preventable diseases and screening for fall risk and other medical concerns.    This visit is provided free of charge (no copay) for all Medicare recipients. The clinical pharmacists at [facility] have begun to conduct these Wellness Visits which will also include a thorough review of all your medications.    As you primary medical provider recommend that you make an appointment for your Annual Wellness Visit if you have not done so already this year.  You may set up this appointment before you leave today or you may call back [phone] and schedule an appointment.  Please make sure when you call that you mention that you are scheduling your Annual Wellness Visit with the clinical pharmacist so that the appointment may be made for the proper length of time.     Continue current medications. Continue good therapeutic lifestyle changes which include good diet and exercise. Fall precautions discussed with patient. If an FOBT was given today- please return it to our front desk. If you are over 52 years old - you may need Prevnar 19 or the adult Pneumonia vaccine.  **Flu shots are available--- please call and schedule a FLU-CLINIC appointment**  After your visit with [place] today you will receive a survey in the mail or online from [employer] regarding your care with [place]. Please take a moment to fill this out. Your feedback is very  important to [place] as you can help [place] better understand your patient needs as well as improve your experience and satisfaction. WE CARE ABOUT YOU!!!   Patient should continue to follow-up with urology, orthopedics, and cardiology. She should continue to be careful and not put herself at risk for falling She should stay as active as possible

## 2016-03-07 NOTE — Progress Notes (Signed)
Subjective:    Patient ID: [name], female    DOB: September 21, 1936, 80 y.o.   MRN: [id number]  HPI Pt here for follow up and management of chronic medical problems which includes hypothyroid, hypertension, and hyperlipidemia. She is taking medications regularly.The patient continues to have some urinary incontinence and is followed by the urologist for this. The patient continues to have back pain and foot pain has also had multiple surgeries for this and especially the foot. The patient is doing extremely well considering the surgeries that she has had and the problems that she is having with her heart. She just saw the cardiologist last month and got a good report and no changes in medicines were made at that time. She denies any chest pain or shortness of breath. She's not having any problems with swallowing heartburn indigestion nausea vomiting diarrhea or blood in the stool. She does have some incontinence and sometimes it is pretty severe. She does wear depends as well as padding for this. She has an upcoming appointment with the urologist next week. She continues to see the orthopedic surgeon about her back and a different orthopedic surgeon about her ankle. She is currently wearing a brace on the ankle. She is also been told that she needs to have a right shoulder replacement and she is reluctant to do this at this time.      Patient Active Problem List   Diagnosis Date Noted  . Irritable bowel syndrome with diarrhea 12/11/2015  . UTI (urinary tract infection) 08/22/2015  . Cough 07/26/2015  . Diarrhea 11/30/2014  . Heme + stool 11/30/2014  . Chronic anticoagulation 11/30/2014  . CHF (congestive heart failure) ([place]) 04/30/2014  . 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 Osteoporosis 01/27/2014  . Fibromyalgia 08/09/2013  . Asthma 08/01/2010  . Hypothyroidism 03/20/2010  . DEPRESSION 03/20/2010  . HTN (hypertension) 03/20/2010  . ESOPHAGEAL MOTILITY DISORDER 03/20/2010  .  Hyperlipidemia 04/22/2009  . Cor athrscl-uns vessel 04/22/2009  . ATRIAL FIBRILLATION, PAROXYSMAL 04/22/2009  . ESOPHAGEAL STRICTURE 10/31/2004  . GERD 10/31/2004  . HIATAL HERNIA 10/06/2001   Outpatient Encounter Prescriptions as of 03/07/2016  Medication Sig  . acetaminophen (TYLENOL) 500 MG tablet Take 500-1,000 mg by mouth every 8 (eight) hours as needed.  . ALLERGY RELIEF 10 MG tablet TAKE 1 TABLET ONCE A DAY  . ALPRAZolam (XANAX) 0.5 MG tablet TAKE 2 TABLETS AT BEDTIME  . Ascorbic Acid (VITAMIN C PO) Take 1 tablet by mouth every morning.  . budesonide (PULMICORT) 0.5 MG/2ML nebulizer solution Take 2 mLs (0.5 mg total) by nebulization 2 (two) times daily. Dx 496  . Calcium Carbonate-Vitamin D (CALCIUM 600+D) 600-400 MG-UNIT per tablet Take 1 tablet by mouth daily at 6 PM.   . cholecalciferol (VITAMIN D) 1000 UNITS tablet Take 1,000 Units by mouth 2 (two) times daily with breakfast and lunch.   . Coenzyme Q10 (COQ10) 100 MG CAPS Take 100 mg by mouth daily.  . CRESTOR 20 MG tablet TAKE 1 TABLET ONCE A DAY  . denosumab (PROLIA) 60 MG/ML SOLN injection Inject 60 mg into the skin every 6 (six) months. Administer in upper arm, thigh, or abdomen  . dextromethorphan-guaiFENesin (MUCINEX DM) 30-600 MG per 12 hr tablet Take 1 tablet by mouth 2 (two) times daily as needed. scheduled  . dicyclomine (BENTYL) 10 MG capsule TAKE (1) OR (2) CAPSULES EVERY SIX HOURS AS NEEDED FOR SPASM  . diltiazem (CARDIZEM CD) 120 MG 24 hr capsule TAKE (1) CAPSULE DAILY  .  ESTRACE VAGINAL 0.1 MG/GM vaginal cream Apply twice a week  . fluticasone (FLONASE) 50 MCG/ACT nasal spray 2 SPRAYS IN EACH NOSTRIL ONCE A DAY  . HYDROcodone-acetaminophen (NORCO) 5-325 MG tablet Take 1 tablet by mouth every 6 (six) hours as needed for moderate pain.  Marland Kitchen levothyroxine (SYNTHROID, LEVOTHROID) 25 MCG tablet TAKE 2 TABLETS DAILY AS DIRECTED  . loratadine (CLARITIN) 10 MG tablet Take 10 mg by mouth daily.   . meclizine (ANTIVERT) 25 MG  tablet Take 1 tablet (25 mg total) by mouth 3 (three) times daily as needed for dizziness.  . Melatonin 3 MG TABS Take 1-2 tablets (3-6 mg total) by mouth at bedtime as needed.  . metoprolol tartrate (LOPRESSOR) 25 MG tablet Take 1 tablet (25 mg total) by mouth 2 (two) times daily. PLEASE CONTACT OFFICE FOR ADDITIONAL REFILLS  . montelukast (SINGULAIR) 10 MG tablet TAKE 1 TABLET ONCE A DAY  . Multiple Vitamin (MULTIVITAMIN WITH MINERALS) TABS Take 1 tablet by mouth daily.  . nitroGLYCERIN (NITROSTAT) 0.4 MG SL tablet Place 1 tablet (0.4 mg total) under the tongue every 5 (five) minutes as needed for chest pain.  Marland Kitchen omega-3 acid ethyl esters (LOVAZA) 1 G capsule TAKE (2) CAPSULES TWICE DAILY.  Marland Kitchen omeprazole-sodium bicarbonate (ZEGERID) 40-1100 MG capsule TAKE (1) CAPSULE DAILY  . ondansetron (ZOFRAN) 4 MG tablet TAKE 1 TABLET EVERY 12 HOURS AS NEEDED FOR NAUSEA  . PROAIR HFA 108 (90 BASE) MCG/ACT inhaler USE 2 PUFFS EVERY 6 HOURS AS NEEDED FOR WHEEZING  . sertraline (ZOLOFT) 100 MG tablet TAKE 1 TABLET ONCE A DAY  . XARELTO 20 MG TABS tablet TAKE 1 TABLET ONCE A DAY   No facility-administered encounter medications on file as of 03/07/2016.      Review of Systems  Constitutional: Negative.   HENT: Negative.   Eyes: Negative.   Respiratory: Negative.   Cardiovascular: Negative.   Gastrointestinal: Negative.   Endocrine: Negative.   Genitourinary: Negative.        Incontinence - followed by urology  Musculoskeletal: Positive for arthralgias (back and right foot pain - followed by Dr Nelva Bush).  Skin: Negative.   Allergic/Immunologic: Negative.   Neurological: Negative.   Hematological: Negative.   Psychiatric/Behavioral: Negative.        Objective:   Physical Exam  Constitutional: She is oriented to person, place, and time. She appears well-developed and well-nourished.  Alert and cooperative  HENT:  Head: Normocephalic and atraumatic.  Right Ear: External ear normal.  Left Ear:  External ear normal.  Nose: Nose normal.  Mouth/Throat: Oropharynx is clear and moist.  Eyes: Conjunctivae and EOM are normal. Pupils are equal, round, and reactive to light. Right eye exhibits no discharge. Left eye exhibits no discharge. No scleral icterus.  Neck: Normal range of motion. Neck supple. No thyromegaly present.  Cardiovascular: Normal rate, regular rhythm, normal heart sounds and intact distal pulses.   No murmur heard. She has good pulses on the left foot and the brace was not removed on the right foot. The heart is regular at 72/m without murmurs.  Pulmonary/Chest: Effort normal and breath sounds normal. No respiratory distress. She has no wheezes. She has no rales. She exhibits no tenderness.  Abdominal: Soft. Bowel sounds are normal. She exhibits no mass. There is tenderness. There is no rebound and no guarding.  Slight epigastric tenderness  Musculoskeletal: She exhibits tenderness. She exhibits no edema.  The patient is wearing a brace on the right foot because of the need for stability  in the ankle. She uses a cane for ambulation to prevent her from falling. She continues to have problems with her back and her right shoulder as well as the right ankle.  Lymphadenopathy:    She has no cervical adenopathy.  Neurological: She is alert and oriented to person, place, and time. She has normal reflexes. No cranial nerve deficit.  Skin: Skin is warm and dry. No rash noted.  Psychiatric: She has a normal mood and affect. Her behavior is normal. Judgment and thought content normal.  Nursing note and vitals reviewed.  BP 129/76 mmHg  Pulse 70  Temp(Src) 96.9 F (36.1 C) (Oral)  Ht 5' (1.524 m)  Wt 154 lb (69.854 kg)  BMI 30.08 kg/m2        Assessment & Plan:  1. Hyperlipidemia -Continue current treatment pending results of lab work - CBC with Differential/Platelet - NMR, lipoprofile  2. Hypothyroidism, unspecified hypothyroidism type -Continue current treatment  pending results of lab work - CBC with Differential/Platelet  3. Essential hypertension -The blood pressure is good today and she will continue with current treatment. She just saw the cardiologist and we will not add an ACE inhibitor at this time. - BMP8+EGFR - CBC with Differential/Platelet - Hepatic function panel  4. Vitamin D deficiency -Continue current treatment pending results of lab work. - CBC with Differential/Platelet - VITAMIN D 25 Hydroxy (Vit-D Deficiency, Fractures)  5. Paroxysmal atrial fibrillation (HCC) -The patient was in normal sinus rhythm today. She recently saw the cardiologist noted treatment changes were made at that time. - CBC with Differential/Platelet  6. Fibromyalgia -No specific complaints with the fibromyalgia today. - CBC with Differential/Platelet  7. Gastroesophageal reflux disease, esophagitis presence not specified -This appears to be stable as she has no complaints with this. - CBC with Differential/Platelet - Hepatic function panel  8. Special screening for malignant neoplasms, colon - Fecal occult blood, imunochemical; Future - CBC with Differential/Platelet  9. Depression -Continue his sertraline  10. Congestive heart failure, unspecified congestive heart failure chronicity, unspecified congestive heart failure type ([place]) -Continue to follow-up with cardiology  Patient Instructions                       Medicare Annual Wellness Visit  [facility] and the medical providers at [facility] strive to bring you the best medical care.  In doing so we not only want to address your current medical conditions and concerns but also to detect new conditions early and prevent illness, disease and health-related problems.    Medicare offers a yearly Wellness Visit which allows our clinical staff to assess your need for preventative services including immunizations, lifestyle education, counseling to decrease risk of  preventable diseases and screening for fall risk and other medical concerns.    This visit is provided free of charge (no copay) for all Medicare recipients. The clinical pharmacists at [facility] have begun to conduct these Wellness Visits which will also include a thorough review of all your medications.    As you primary medical provider recommend that you make an appointment for your Annual Wellness Visit if you have not done so already this year.  You may set up this appointment before you leave today or you may call back [phone]) and schedule an appointment.  Please make sure when you call that you mention that you are scheduling your Annual Wellness Visit with the clinical pharmacist so that the appointment may be made  for the proper length of time.     Continue current medications. Continue good therapeutic lifestyle changes which include good diet and exercise. Fall precautions discussed with patient. If an FOBT was given today- please return it to our front desk. If you are over 74 years old - you may need Prevnar 72 or the adult Pneumonia vaccine.  **Flu shots are available--- please call and schedule a FLU-CLINIC appointment**  After your visit with [place] today you will receive a survey in the mail or online from [employer] regarding your care with [place]. Please take a moment to fill this out. Your feedback is very important to [place] as you can help [place] better understand your patient needs as well as improve your experience and satisfaction. WE CARE ABOUT YOU!!!   Patient should continue to follow-up with urology, orthopedics, and cardiology. She should continue to be careful and not put herself at risk for falling She should stay as active as possible   Arrie Senate MD

## 2016-03-08 LAB — HEPATIC FUNCTION PANEL
ALT: 15 IU/L (ref 0–32)
AST: 21 IU/L (ref 0–40)
Albumin: 4.5 g/dL (ref 3.5–4.8)
Alkaline Phosphatase: 91 IU/L (ref 39–117)
Bilirubin Total: 0.4 mg/dL (ref 0.0–1.2)
Bilirubin, Direct: 0.12 mg/dL (ref 0.00–0.40)
Total Protein: 7.5 g/dL (ref 6.0–8.5)

## 2016-03-08 LAB — CBC WITH DIFFERENTIAL/PLATELET
Basophils Absolute: 0.1 10*3/uL (ref 0.0–0.2)
Basos: 1 %
EOS (ABSOLUTE): 0.2 10*3/uL (ref 0.0–0.4)
Eos: 3 %
Hematocrit: 37.9 % (ref 34.0–46.6)
Hemoglobin: 12.7 g/dL (ref 11.1–15.9)
Immature Grans (Abs): 0 10*3/uL (ref 0.0–0.1)
Immature Granulocytes: 0 %
Lymphocytes Absolute: 2.7 10*3/uL (ref 0.7–3.1)
Lymphs: 44 %
MCH: 30.8 pg (ref 26.6–33.0)
MCHC: 33.5 g/dL (ref 31.5–35.7)
MCV: 92 fL (ref 79–97)
Monocytes Absolute: 0.5 10*3/uL (ref 0.1–0.9)
Monocytes: 8 %
Neutrophils Absolute: 2.7 10*3/uL (ref 1.4–7.0)
Neutrophils: 44 %
Platelets: 304 10*3/uL (ref 150–379)
RBC: 4.13 x10E6/uL (ref 3.77–5.28)
RDW: 13.6 % (ref 12.3–15.4)
WBC: 6 10*3/uL (ref 3.4–10.8)

## 2016-03-08 LAB — NMR, LIPOPROFILE
Cholesterol: 148 mg/dL (ref 100–199)
HDL Cholesterol by NMR: 66 mg/dL (ref >39)
HDL Particle Number: 36.4 umol/L (ref >=30.5)
LDL Particle Number: 677 nmol/L (ref <1000)
LDL Size: 21.1 nm (ref >20.5)
LDL-C: 57 mg/dL (ref 0–99)
LP-IR Score: 32 (ref <=45)
Small LDL Particle Number: 371 nmol/L (ref <=527)
Triglycerides by NMR: 127 mg/dL (ref 0–149)

## 2016-03-08 LAB — BMP8+EGFR
BUN/Creatinine Ratio: 18 (ref 11–26)
BUN: 14 mg/dL (ref 8–27)
CO2: 27 mmol/L (ref 18–29)
Calcium: 9.5 mg/dL (ref 8.7–10.3)
Chloride: 98 mmol/L (ref 96–106)
Creatinine, Ser: 0.79 mg/dL (ref 0.57–1.00)
GFR calc Af Amer: 84 mL/min/{1.73_m2} (ref >59)
GFR calc non Af Amer: 72 mL/min/{1.73_m2} (ref >59)
Glucose: 92 mg/dL (ref 65–99)
Potassium: 4.5 mmol/L (ref 3.5–5.2)
Sodium: 137 mmol/L (ref 134–144)

## 2016-03-08 LAB — VITAMIN D 25 HYDROXY (VIT D DEFICIENCY, FRACTURES): Vit D, 25-Hydroxy: 57.5 ng/mL (ref 30.0–100.0)

## 2016-03-11 ENCOUNTER — Other Ambulatory Visit: Payer: Self-pay | Admitting: Pharmacist

## 2016-03-11 MED ORDER — DENOSUMAB 60 MG/ML ~~LOC~~ SOLN: 60.0000 mg | SUBCUTANEOUS | Status: DC

## 2016-03-12 ENCOUNTER — Ambulatory Visit (INDEPENDENT_AMBULATORY_CARE_PROVIDER_SITE_OTHER): Payer: Medicare Other | Admitting: Urology

## 2016-03-12 DIAGNOSIS — N3020 Other chronic cystitis without hematuria: Secondary | ICD-10-CM | POA: Diagnosis not present

## 2016-03-12 DIAGNOSIS — N3946 Mixed incontinence: Secondary | ICD-10-CM

## 2016-03-12 DIAGNOSIS — N952 Postmenopausal atrophic vaginitis: Secondary | ICD-10-CM

## 2016-03-18 ENCOUNTER — Telehealth: Payer: Self-pay | Admitting: Family Medicine

## 2016-03-18 ENCOUNTER — Other Ambulatory Visit: Payer: Self-pay | Admitting: Internal Medicine

## 2016-03-18 ENCOUNTER — Other Ambulatory Visit: Payer: Self-pay | Admitting: Cardiology

## 2016-03-18 ENCOUNTER — Other Ambulatory Visit: Payer: Self-pay | Admitting: Family Medicine

## 2016-03-18 NOTE — Telephone Encounter (Signed)
Last seen 03/07/16  DWM  If approved route to nurse to call into [facility]

## 2016-03-18 NOTE — Telephone Encounter (Signed)
Rx refill sent to pharmacy. 

## 2016-03-19 ENCOUNTER — Other Ambulatory Visit: Payer: Self-pay

## 2016-03-19 DIAGNOSIS — M47816 Spondylosis without myelopathy or radiculopathy, lumbar region: Secondary | ICD-10-CM | POA: Diagnosis not present

## 2016-03-19 NOTE — Telephone Encounter (Signed)
Last seen 03/07/16  DWM  This med not on EPIC list  Sent from pharmacy to fill

## 2016-03-20 NOTE — Telephone Encounter (Signed)
done

## 2016-03-22 ENCOUNTER — Ambulatory Visit (INDEPENDENT_AMBULATORY_CARE_PROVIDER_SITE_OTHER): Payer: Medicare Other | Admitting: Pharmacist

## 2016-03-22 DIAGNOSIS — Z1211 Encounter for screening for malignant neoplasm of colon: Secondary | ICD-10-CM

## 2016-03-22 DIAGNOSIS — M810 Age-related osteoporosis without current pathological fracture: Secondary | ICD-10-CM

## 2016-03-22 MED ORDER — DENOSUMAB 60 MG/ML ~~LOC~~ SOLN: 60.0000 mg | SUBCUTANEOUS | Status: DC

## 2016-03-22 NOTE — Addendum Note (Signed)
Addended by: Liliane Bade on: 03/22/2016 01:02 PM   Modules accepted: Orders

## 2016-03-22 NOTE — Progress Notes (Signed)
Patient ID: [name], female   DOB: [birth year], 80 y.o.   MRN: [id number]  Patient forgot to bring in [place] and didn't have one in house to give.   Will reschedule.

## 2016-03-27 LAB — FECAL OCCULT BLOOD, IMMUNOCHEMICAL: Fecal Occult Bld: NEGATIVE

## 2016-04-03 ENCOUNTER — Ambulatory Visit (INDEPENDENT_AMBULATORY_CARE_PROVIDER_SITE_OTHER): Payer: Medicare Other | Admitting: Pharmacist

## 2016-04-03 ENCOUNTER — Encounter: Payer: Self-pay | Admitting: Pharmacist

## 2016-04-03 VITALS — Ht 60.0 in | Wt 152.0 lb

## 2016-04-03 DIAGNOSIS — M810 Age-related osteoporosis without current pathological fracture: Secondary | ICD-10-CM

## 2016-04-03 MED ORDER — DENOSUMAB 60 MG/ML ~~LOC~~ SOLN
60.0000 mg | Freq: Once | SUBCUTANEOUS | Status: AC
  Administered 2016-04-03: 60 mg via SUBCUTANEOUS

## 2016-04-03 NOTE — Progress Notes (Signed)
Patient ID: [name], female   DOB: 05-09-1936, 80 y.o.   MRN: [id number]   [name] is a 80 y.o. female who presents for  Osteoporosis and Prolia Administration  Last injection was 09/2015 and she tolerated well   Current Problems (verified) Patient Active Problem List   Diagnosis Date Noted  . Irritable bowel syndrome with diarrhea 12/11/2015  . UTI (urinary tract infection) 08/22/2015  . Cough 07/26/2015  . Diarrhea 11/30/2014  . Heme + stool 11/30/2014  . Chronic anticoagulation 11/30/2014  . CHF (congestive heart failure) ([place]) 04/30/2014  . 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 Osteoporosis 01/27/2014  . Fibromyalgia 08/09/2013  . Asthma 08/01/2010  . Hypothyroidism 03/20/2010  . Depression 03/20/2010  . HTN (hypertension) 03/20/2010  . ESOPHAGEAL MOTILITY DISORDER 03/20/2010  . Hyperlipidemia 04/22/2009  . Cor athrscl-uns vessel 04/22/2009  . ATRIAL FIBRILLATION, PAROXYSMAL 04/22/2009  . ESOPHAGEAL STRICTURE 10/31/2004  . GERD 10/31/2004  . HIATAL HERNIA 10/06/2001     Current Medications (verified) Current Outpatient Prescriptions  Medication Sig Dispense Refill  . acetaminophen (TYLENOL) 500 MG tablet Take 500-1,000 mg by mouth every 8 (eight) hours as needed.    . ALLERGY RELIEF 10 MG tablet TAKE 1 TABLET ONCE A DAY 30 tablet 9  . ALPRAZolam (XANAX) 0.5 MG tablet TAKE 2 TABLETS AT BEDTIME 60 tablet 1  . Ascorbic Acid (VITAMIN C PO) Take 1 tablet by mouth every morning.    . budesonide (PULMICORT) 0.5 MG/2ML nebulizer solution Take 2 mLs (0.5 mg total) by nebulization 2 (two) times daily. Dx 496 120 mL 6  . Calcium Carbonate-Vitamin D (CALCIUM 600+D) 600-400 MG-UNIT per tablet Take 1 tablet by mouth daily at 6 PM.     . cholecalciferol (VITAMIN D) 1000 UNITS tablet Take 1,000 Units by mouth 2 (two) times daily with breakfast and lunch.     . Coenzyme Q10 (COQ10) 100 MG CAPS Take 100 mg by mouth daily.    . CRESTOR 20 MG tablet TAKE 1 TABLET ONCE  A DAY 30 tablet 4  . denosumab (PROLIA) 60 MG/ML SOLN injection Inject 60 mg into the skin every 6 (six) months. Administer in upper arm, thigh, or abdomen 1 mL 0  . dextromethorphan-guaiFENesin (MUCINEX DM) 30-600 MG per 12 hr tablet Take 1 tablet by mouth 2 (two) times daily as needed. scheduled    . dicyclomine (BENTYL) 10 MG capsule TAKE (1) OR (2) CAPSULES EVERY SIX HOURS AS NEEDED FOR SPASM 60 capsule 3  . diltiazem (CARDIZEM CD) 120 MG 24 hr capsule TAKE (1) CAPSULE DAILY 30 capsule 10  . ESTRACE VAGINAL 0.1 MG/GM vaginal cream Apply twice a week    . fluticasone (FLONASE) 50 MCG/ACT nasal spray 2 SPRAYS IN EACH NOSTRIL ONCE A DAY 16 g 3  . HYDROcodone-acetaminophen (NORCO) 5-325 MG tablet Take 1 tablet by mouth every 6 (six) hours as needed for moderate pain. 30 tablet 0  . levothyroxine (SYNTHROID, LEVOTHROID) 25 MCG tablet TAKE 2 TABLETS DAILY AS DIRECTED 60 tablet 5  . loratadine (CLARITIN) 10 MG tablet Take 10 mg by mouth daily.     . meclizine (ANTIVERT) 25 MG tablet Take 1 tablet (25 mg total) by mouth 3 (three) times daily as needed for dizziness. 30 tablet 0  . Melatonin 3 MG TABS Take 1-2 tablets (3-6 mg total) by mouth at bedtime as needed.  0  . metoprolol tartrate (LOPRESSOR) 25 MG tablet Take 1 tablet (25 mg total) by mouth 2 (two) times  daily. PLEASE CONTACT OFFICE FOR ADDITIONAL REFILLS 60 tablet 3  . montelukast (SINGULAIR) 10 MG tablet TAKE 1 TABLET ONCE A DAY 30 tablet 9  . Multiple Vitamin (MULTIVITAMIN WITH MINERALS) TABS Take 1 tablet by mouth daily.    . nitroGLYCERIN (NITROSTAT) 0.4 MG SL tablet Place 1 tablet (0.4 mg total) under the tongue every 5 (five) minutes as needed for chest pain. 30 tablet 1  . omega-3 acid ethyl esters (LOVAZA) 1 G capsule TAKE (2) CAPSULES TWICE DAILY. 120 capsule 4  . omeprazole-sodium bicarbonate (ZEGERID) 40-1100 MG capsule TAKE (1) CAPSULE DAILY 30 capsule 6  . ondansetron (ZOFRAN) 4 MG tablet TAKE 1 TABLET EVERY 12 HOURS AS NEEDED FOR  NAUSEA 20 tablet 0  . PROAIR HFA 108 (90 BASE) MCG/ACT inhaler USE 2 PUFFS EVERY 6 HOURS AS NEEDED FOR WHEEZING 8.5 g 5  . sertraline (ZOLOFT) 100 MG tablet TAKE 1 TABLET ONCE A DAY 30 tablet 6  . XARELTO 20 MG TABS tablet TAKE 1 TABLET ONCE A DAY 30 tablet 6   No current facility-administered medications for this visit.     Allergies (verified) Aspirin; Captopril; Naproxen; Penicillins; Sulfonamide derivatives; and Clindamycin/lincomycin   PAST HISTORY  Family History Family History  Problem Relation Age of Onset  . Prostate cancer Brother   . Cancer Brother     PROSTATE  . Heart disease Mother   . Asthma Mother   . Congestive Heart Failure Mother   . Emphysema Sister   . COPD Sister   . Stroke Sister   . Heart disease Sister   . Emphysema Brother   . Rheumatologic disease Neg Hx     Social History Social History  Substance Use Topics  . Smoking status: Former Smoker -- 1.00 packs/day for 30 years    Types: Cigarettes    Quit date: 12/16/1989  . Smokeless tobacco: Never Used     Comment: smoked off & on  . Alcohol Use: No        Objective:   Body mass index is 29.69 kg/(m^2). Ht 5' (1.524 m)  Wt 152 lb (68.947 kg)  BMI 29.69 kg/m2   DEXA Results Date of Test T-Score for AP Spine L1-L4 T-Score for Total Left Hip T-Score for Total Right Hip  07/27/2014 -1.5 -1.6 -1.9  07/15/2012 -1.8` -1.6 -2.0  05/29/2011 -2.2 -1.7 -2.2  02/16/2007 -2.1 -1.1 -1.6   Lowest T-Score at L-1 was -3.2 *(05/29/2011)  Assessment:     Osteoporosis - improved BMD since starting Prolia     Plan:     1.  prolia 60mg  SQ administered today - patient brought prolia from home / pharmacy.  2.  Continue calcium 1200mg  daily from supplementation and diet 3.  Weight bearing exercise as able.  4.  Fall prevention discussed.  5.  Recheck DEXA 09/2016 at time of next prolia injection  [name], PharmD, CPP

## 2016-04-08 ENCOUNTER — Ambulatory Visit (HOSPITAL_BASED_OUTPATIENT_CLINIC_OR_DEPARTMENT_OTHER): Payer: Medicare Other

## 2016-04-14 IMAGING — RF DG ESOPHAGUS
9 of 11 series · 14 of 24 positions shown · non-contrast
Comparison: None.

CLINICAL DATA: Chronic cough and bronchitis. Remote history of
esophageal dilatation. Patient under medical management for GERD

EXAM:
ESOPHOGRAM / BARIUM SWALLOW / BARIUM TABLET STUDY
TECHNIQUE: Combined double contrast and single contrast examination performed
using effervescent crystals, thick barium liquid, and thin barium
liquid. The patient was observed with fluoroscopy swallowing a 13 mm
barium sulphate tablet.
FLUOROSCOPY TIME:  Radiation Exposure Index (as provided by the
fluoroscopic device):
If the device does not provide the exposure index:
Fluoroscopy Time:   2 minutes 20 seconds need
Number of Acquired Images:  11

[Series 1: run · 1 of 1 slices shown (1 of 9)]
[im 1/1]
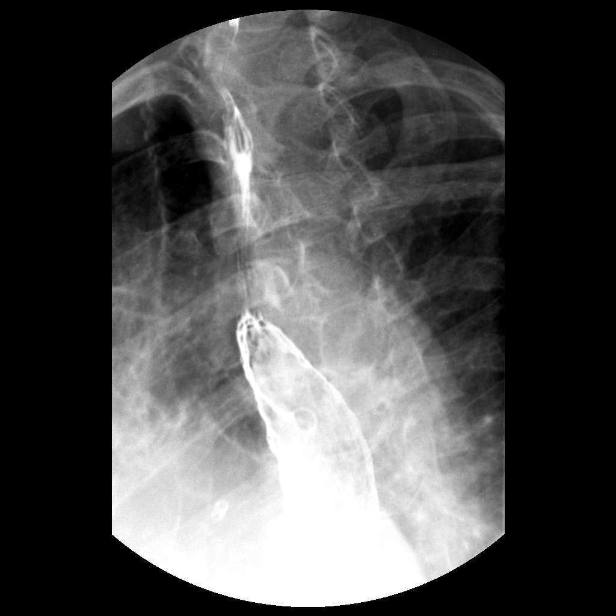

[Series 3: run · 1 of 1 slices shown (2 of 9)]
[im 1/1]
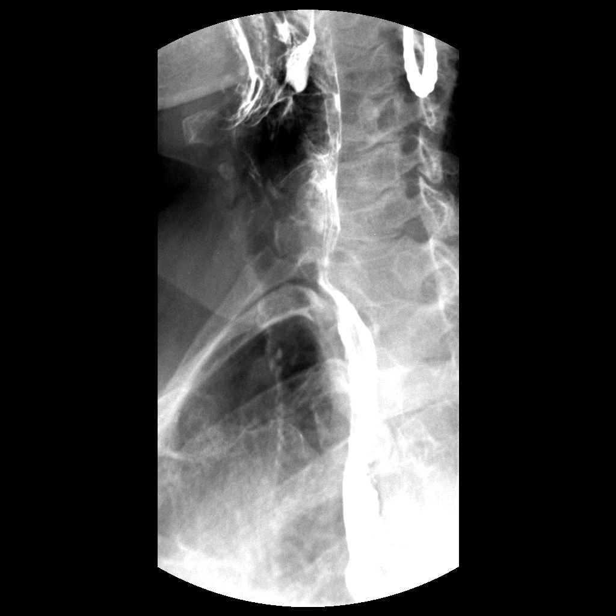

[Series 4: run · 4 of 16 slices shown (3 of 9)]
[im 3/16]
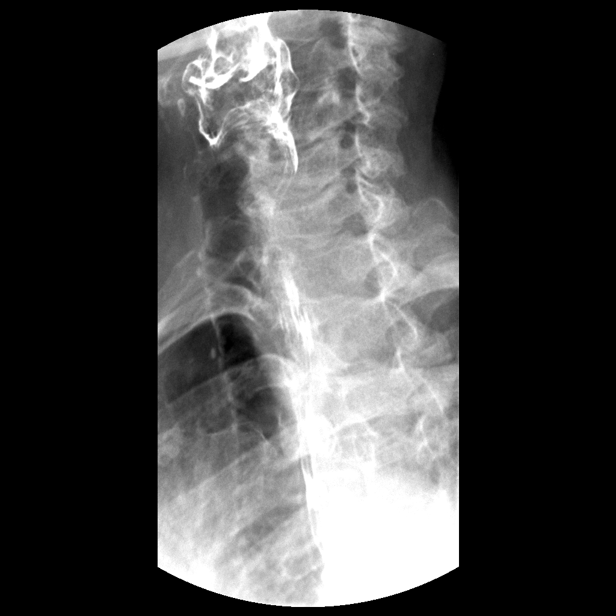
[im 8/16]
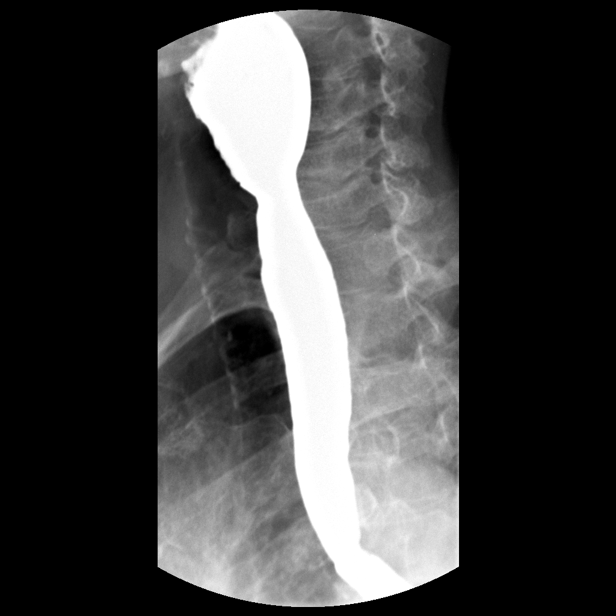
[im 11/16]
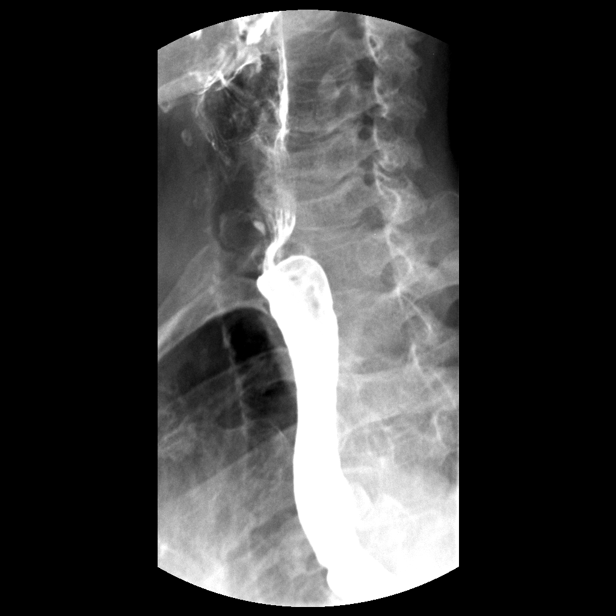
[im 16/16]
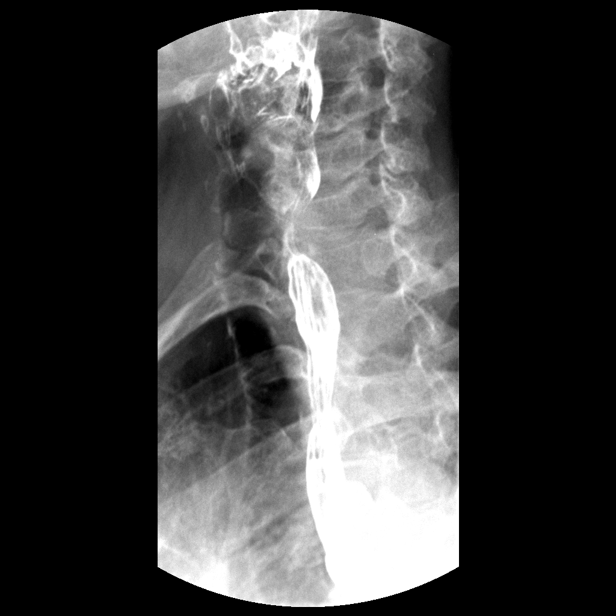

[Series 5: run · 3 of 14 slices shown (4 of 9)]
[im 3/14]
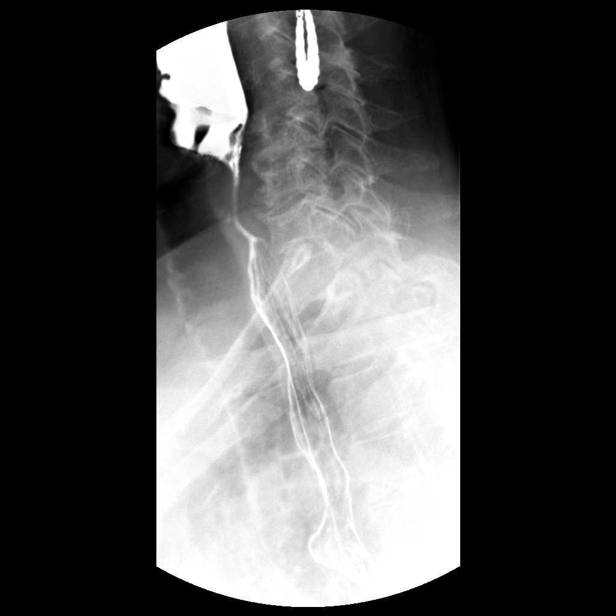
[im 6/14]
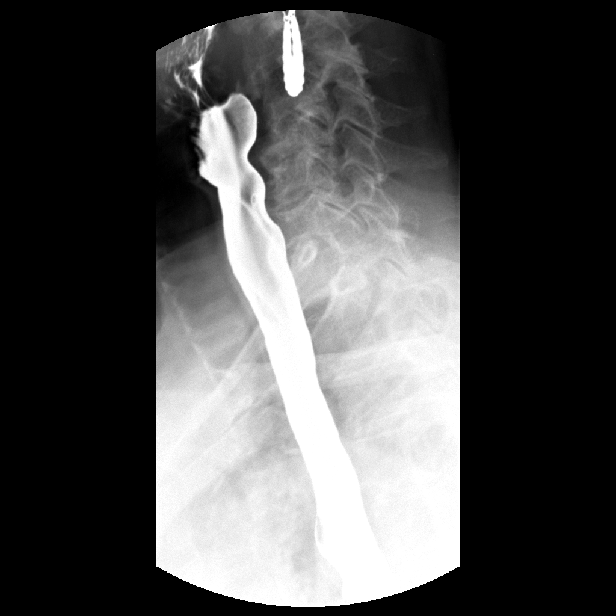
[im 11/14]
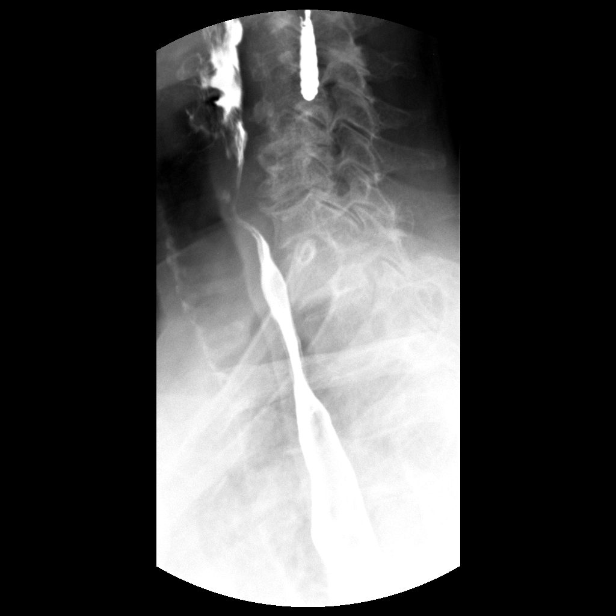

[Series 6: run · 1 of 4 slices shown (5 of 9)]
[im 1/4]
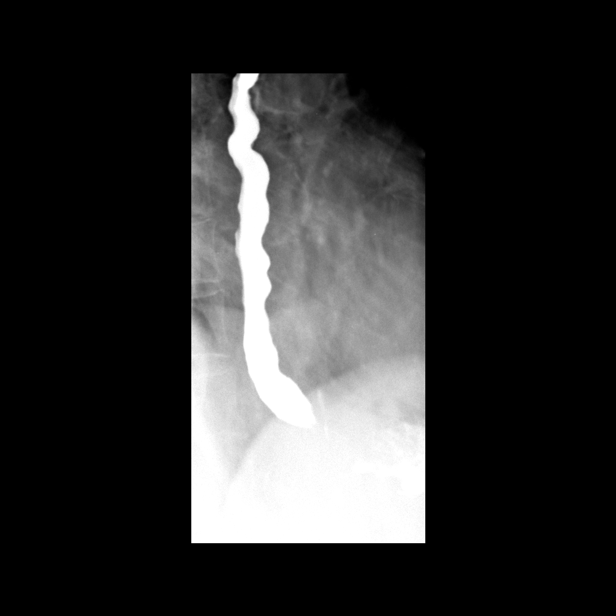

[Series 7: run · 1 of 2 slices shown (6 of 9)]
[im 1/2]
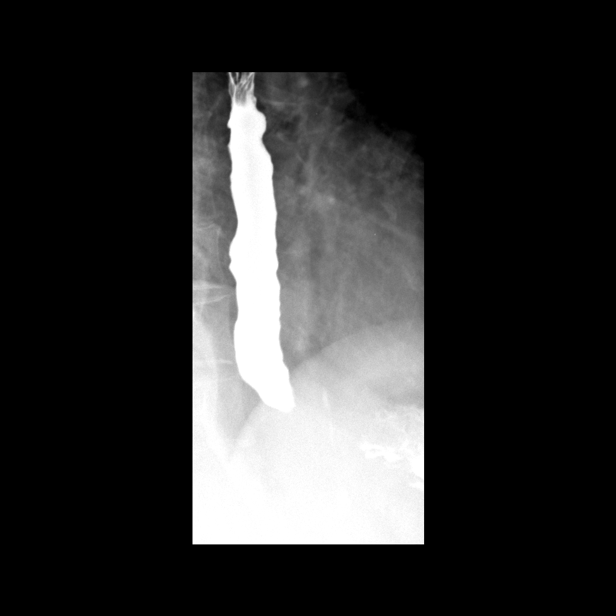

[Series 8: run · 1 of 3 slices shown (7 of 9)]
[im 1/3]
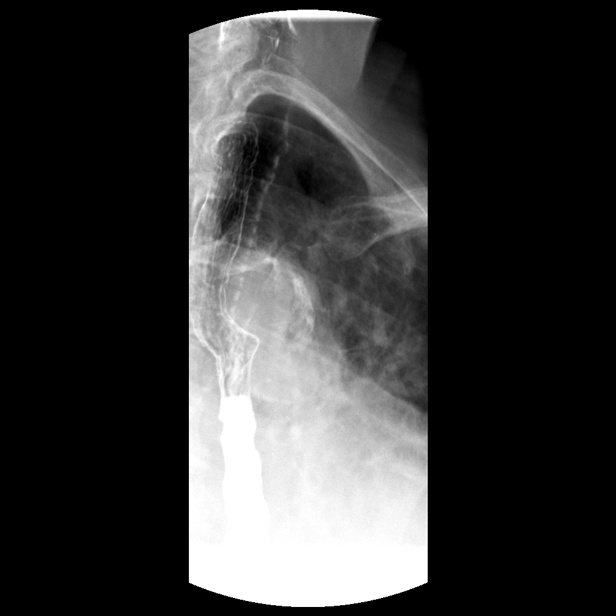

[Series 9: run · 1 of 5 slices shown (8 of 9)]
[im 5/5]
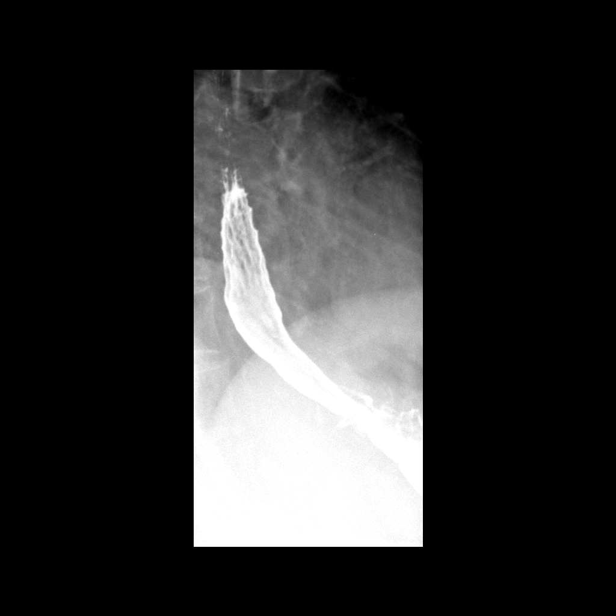

[Series 11: run · 1 of 1 slices shown (9 of 9)]
[im 1/1]
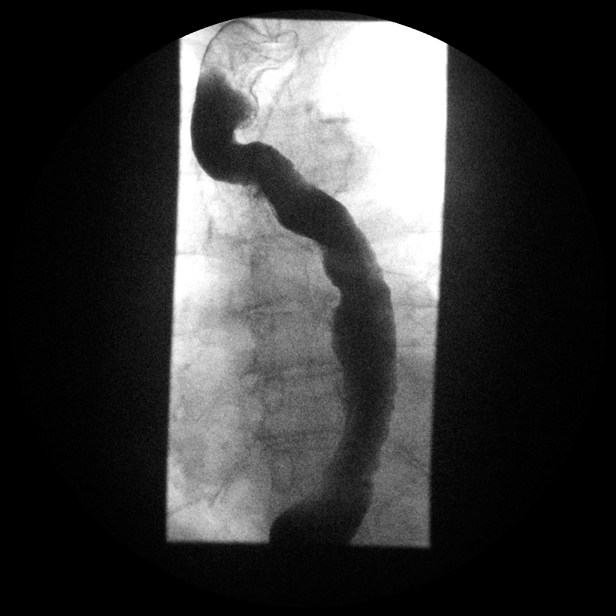

[14 of 24 positions shown; findings below may reference images not displayed]

FINDINGS: Rapid image sequencing of the high cervical esophagus demonstrates a
prominent indentation in the posterior wall of the esophagus at the
C6-C7 level. This either represents osteophyte encroachment versus a
prominent cricopharyngeal bar. The encroachment is is approximately
30 to 40% of the esophageal diameter.

There is noted mucosal irregularity within the thoracic esophagus or
distal esophagus. There is poor initiation of the peristaltic wave
with escape of the barium bolus from the primary peristaltic wave.
There was mild tortuosity of the esophagus suggesting spasm. No
clear tertiary contractions.

With patient in supine position there was a a high column of
gastroesophageal reflux to the level of thoracic inlet.

A barium tablet passed the GE junction easily.
IMPRESSION: 1. Prominent cricopharyngeal bar versus osteophyte encroachment into
the posterior wall of the high cervical esophagus.
2. Solid just to a dysmotility consistent process esophagus and mild
spasm.
3. Moderate to severe gastroesophageal reflux.

## 2016-04-16 ENCOUNTER — Other Ambulatory Visit: Payer: Self-pay | Admitting: Family Medicine

## 2016-04-18 DIAGNOSIS — R269 Unspecified abnormalities of gait and mobility: Secondary | ICD-10-CM | POA: Diagnosis not present

## 2016-04-18 DIAGNOSIS — M7741 Metatarsalgia, right foot: Secondary | ICD-10-CM | POA: Diagnosis not present

## 2016-04-18 DIAGNOSIS — S92425A Nondisplaced fracture of distal phalanx of left great toe, initial encounter for closed fracture: Secondary | ICD-10-CM | POA: Diagnosis not present

## 2016-04-18 DIAGNOSIS — M21171 Varus deformity, not elsewhere classified, right ankle: Secondary | ICD-10-CM | POA: Diagnosis not present

## 2016-04-18 DIAGNOSIS — M12571 Traumatic arthropathy, right ankle and foot: Secondary | ICD-10-CM | POA: Diagnosis not present

## 2016-04-23 ENCOUNTER — Telehealth: Payer: Self-pay | Admitting: Family Medicine

## 2016-04-23 MED ORDER — BENZONATATE 100 MG PO CAPS: 100.0000 mg | ORAL_CAPSULE | Freq: Two times a day (BID) | ORAL | Status: DC | PRN

## 2016-04-23 NOTE — Telephone Encounter (Signed)
Please refill Tessalon Perles

## 2016-04-23 NOTE — Telephone Encounter (Signed)
rx sent to pharmacy and patient aware 

## 2016-04-30 ENCOUNTER — Telehealth: Payer: Self-pay | Admitting: *Deleted

## 2016-04-30 NOTE — Telephone Encounter (Signed)
Medical Clearance request form faxed back to [facility] for pt to come off of medication before injection, per office protocol (as per Erasmo Downer) ok to be off Xarelto for 3 days before injection.

## 2016-05-02 DIAGNOSIS — M21171 Varus deformity, not elsewhere classified, right ankle: Secondary | ICD-10-CM | POA: Diagnosis not present

## 2016-05-02 DIAGNOSIS — M7741 Metatarsalgia, right foot: Secondary | ICD-10-CM | POA: Diagnosis not present

## 2016-05-14 ENCOUNTER — Other Ambulatory Visit: Payer: Self-pay | Admitting: Cardiology

## 2016-05-14 ENCOUNTER — Other Ambulatory Visit: Payer: Self-pay | Admitting: Family Medicine

## 2016-05-14 ENCOUNTER — Other Ambulatory Visit: Payer: Self-pay | Admitting: Internal Medicine

## 2016-05-14 NOTE — Telephone Encounter (Signed)
Patient last seen March 2017

## 2016-05-15 DIAGNOSIS — M47816 Spondylosis without myelopathy or radiculopathy, lumbar region: Secondary | ICD-10-CM | POA: Diagnosis not present

## 2016-05-24 DIAGNOSIS — M12571 Traumatic arthropathy, right ankle and foot: Secondary | ICD-10-CM | POA: Diagnosis not present

## 2016-05-24 DIAGNOSIS — M7741 Metatarsalgia, right foot: Secondary | ICD-10-CM | POA: Diagnosis not present

## 2016-05-24 DIAGNOSIS — M21171 Varus deformity, not elsewhere classified, right ankle: Secondary | ICD-10-CM | POA: Diagnosis not present

## 2016-05-24 DIAGNOSIS — R269 Unspecified abnormalities of gait and mobility: Secondary | ICD-10-CM | POA: Diagnosis not present

## 2016-05-24 DIAGNOSIS — M24575 Contracture, left foot: Secondary | ICD-10-CM | POA: Diagnosis not present

## 2016-05-29 DIAGNOSIS — M5136 Other intervertebral disc degeneration, lumbar region: Secondary | ICD-10-CM | POA: Diagnosis not present

## 2016-05-29 DIAGNOSIS — M47816 Spondylosis without myelopathy or radiculopathy, lumbar region: Secondary | ICD-10-CM | POA: Diagnosis not present

## 2016-05-29 DIAGNOSIS — M5416 Radiculopathy, lumbar region: Secondary | ICD-10-CM | POA: Diagnosis not present

## 2016-06-04 ENCOUNTER — Telehealth: Payer: Self-pay | Admitting: Family Medicine

## 2016-06-04 NOTE — Telephone Encounter (Signed)
Called pt and she will bring over forms

## 2016-06-11 ENCOUNTER — Ambulatory Visit: Payer: Medicare Other | Admitting: Urology

## 2016-06-13 ENCOUNTER — Other Ambulatory Visit: Payer: Self-pay | Admitting: Family Medicine

## 2016-06-14 ENCOUNTER — Other Ambulatory Visit: Payer: Self-pay | Admitting: Family Medicine

## 2016-06-14 ENCOUNTER — Encounter: Payer: Self-pay | Admitting: Nurse Practitioner

## 2016-06-14 ENCOUNTER — Ambulatory Visit (INDEPENDENT_AMBULATORY_CARE_PROVIDER_SITE_OTHER): Payer: Medicare Other | Admitting: Nurse Practitioner

## 2016-06-14 VITALS — BP 143/81 | HR 65 | Temp 97.0°F | Ht 60.0 in | Wt 155.2 lb

## 2016-06-14 DIAGNOSIS — W5501XA Bitten by cat, initial encounter: Secondary | ICD-10-CM

## 2016-06-14 DIAGNOSIS — S60911A Unspecified superficial injury of right wrist, initial encounter: Secondary | ICD-10-CM

## 2016-06-14 MED ORDER — CIPROFLOXACIN HCL 500 MG PO TABS: 500.0000 mg | ORAL_TABLET | Freq: Two times a day (BID) | ORAL | Status: DC

## 2016-06-14 NOTE — Progress Notes (Signed)
   Subjective:    Patient ID: [name], female    DOB: 1936-10-14, 80 y.o.   MRN: [id number]  HPI Patient in c/ocat bite to right wrist. Was bitten by her cat 2 days ago- area is sore and red.    Review of Systems  Constitutional: Negative.   HENT: Negative.   Respiratory: Negative.   Cardiovascular: Negative.   Gastrointestinal: Negative.   Genitourinary: Negative.   Neurological: Negative.   Psychiatric/Behavioral: Negative.   All other systems reviewed and are negative.      Objective:   Physical Exam  Constitutional: She appears well-developed and well-nourished. No distress.  Cardiovascular: Normal rate, regular rhythm and normal heart sounds.   Pulmonary/Chest: Effort normal and breath sounds normal.  Skin:  2 sets of teeth marks on right wrist- surrounding erythema and edema. No drainage    BP 143/81 mmHg  Pulse 65  Temp(Src) 97 F (36.1 C) (Oral)  Ht 5' (1.524 m)  Wt 155 lb 3.2 oz (70.398 kg)  BMI 30.31 kg/m2       Assessment & Plan:   1. Cat bite, initial encounter    Meds ordered this encounter  Medications  . dextromethorphan-guaiFENesin (MUCINEX DM) 30-600 MG 12hr tablet    Sig: Take 1 tablet by mouth 2 (two) times daily.  . ciprofloxacin (CIPRO) 500 MG tablet    Sig: Take 1 tablet (500 mg total) by mouth 2 (two) times daily.    Dispense:  20 tablet    Refill:  0    Order Specific Question:  Supervising Provider    Answer:  Joycelyn Man   Clean with antibacterial soap bid RTO prn  Mary-Margaret Hassell Done, FNP

## 2016-06-14 NOTE — Patient Instructions (Signed)

## 2016-06-26 DIAGNOSIS — M5416 Radiculopathy, lumbar region: Secondary | ICD-10-CM | POA: Diagnosis not present

## 2016-06-26 DIAGNOSIS — M25512 Pain in left shoulder: Secondary | ICD-10-CM | POA: Diagnosis not present

## 2016-07-12 ENCOUNTER — Other Ambulatory Visit: Payer: Self-pay | Admitting: Family Medicine

## 2016-07-12 DIAGNOSIS — M21171 Varus deformity, not elsewhere classified, right ankle: Secondary | ICD-10-CM | POA: Diagnosis not present

## 2016-07-12 DIAGNOSIS — R269 Unspecified abnormalities of gait and mobility: Secondary | ICD-10-CM | POA: Diagnosis not present

## 2016-07-12 DIAGNOSIS — M7752 Other enthesopathy of left foot: Secondary | ICD-10-CM | POA: Diagnosis not present

## 2016-07-12 DIAGNOSIS — M12571 Traumatic arthropathy, right ankle and foot: Secondary | ICD-10-CM | POA: Diagnosis not present

## 2016-07-12 NOTE — Telephone Encounter (Signed)
Covering for PCP  Refilled xanax and synthroid (due for labs), needs appt before next fill  Laroy Apple, MD [facility] 07/12/2016, 12:30 PM

## 2016-07-12 NOTE — Telephone Encounter (Signed)
LMOVM will NTBS. Refill call to [facility]

## 2016-07-16 ENCOUNTER — Ambulatory Visit: Payer: Medicare Other | Admitting: Urology

## 2016-07-18 ENCOUNTER — Other Ambulatory Visit: Payer: Self-pay | Admitting: Family Medicine

## 2016-07-24 ENCOUNTER — Ambulatory Visit: Payer: Medicare Other | Admitting: Family Medicine

## 2016-08-14 ENCOUNTER — Other Ambulatory Visit: Payer: Self-pay | Admitting: Family

## 2016-08-14 ENCOUNTER — Other Ambulatory Visit: Payer: Self-pay | Admitting: Family Medicine

## 2016-08-20 ENCOUNTER — Telehealth: Payer: Self-pay | Admitting: Family Medicine

## 2016-08-20 ENCOUNTER — Ambulatory Visit: Payer: Medicare Other | Admitting: Urology

## 2016-08-20 DIAGNOSIS — M5416 Radiculopathy, lumbar region: Secondary | ICD-10-CM | POA: Diagnosis not present

## 2016-08-28 ENCOUNTER — Ambulatory Visit (INDEPENDENT_AMBULATORY_CARE_PROVIDER_SITE_OTHER): Payer: Medicare Other | Admitting: Family Medicine

## 2016-08-28 ENCOUNTER — Encounter: Payer: Self-pay | Admitting: Family Medicine

## 2016-08-28 VITALS — BP 125/76 | HR 64 | Temp 97.4°F | Ht 60.0 in | Wt 154.0 lb

## 2016-08-28 DIAGNOSIS — E559 Vitamin D deficiency, unspecified: Secondary | ICD-10-CM | POA: Diagnosis not present

## 2016-08-28 DIAGNOSIS — E785 Hyperlipidemia, unspecified: Secondary | ICD-10-CM

## 2016-08-28 DIAGNOSIS — K219 Gastro-esophageal reflux disease without esophagitis: Secondary | ICD-10-CM | POA: Diagnosis not present

## 2016-08-28 DIAGNOSIS — M79604 Pain in right leg: Secondary | ICD-10-CM

## 2016-08-28 DIAGNOSIS — E039 Hypothyroidism, unspecified: Secondary | ICD-10-CM | POA: Diagnosis not present

## 2016-08-28 DIAGNOSIS — M5440 Lumbago with sciatica, unspecified side: Secondary | ICD-10-CM | POA: Diagnosis not present

## 2016-08-28 DIAGNOSIS — I10 Essential (primary) hypertension: Secondary | ICD-10-CM | POA: Diagnosis not present

## 2016-08-28 DIAGNOSIS — I480 Paroxysmal atrial fibrillation: Secondary | ICD-10-CM

## 2016-08-28 DIAGNOSIS — N200 Calculus of kidney: Secondary | ICD-10-CM | POA: Diagnosis not present

## 2016-08-28 DIAGNOSIS — M797 Fibromyalgia: Secondary | ICD-10-CM | POA: Diagnosis not present

## 2016-08-28 MED ORDER — BENZONATATE 100 MG PO CAPS: ORAL_CAPSULE | ORAL | 1 refills | Status: DC

## 2016-08-28 NOTE — Progress Notes (Signed)
Subjective:    Patient ID: [name], female    DOB: 06-05-1936, 80 y.o.   MRN: [id number]  HPI Pt here for follow up and management of chronic medical problems which includes a fib, hyperlipidemia, hypertension and hypothyroid. She is taking medications regularly.Patient returns to the office for recheck. She has multiple problems with multiple diagnoses. History of congestive heart failure osteoporosis fibromyalgia atrial fibrillation hypothyroidism and hypertension. He sees the cardiologist regularly. She she complains today of persistent right foot pain. She has had left ear pain and has been getting injections in her back. She is requesting a refill on Tessalon Perles and we will do this. She is also due a DEXA scan but she prefers to wait at this time. She will get lab work done today. Her weight is stable compared to previous readings. Patient's biggest complaint is with the shortened right leg and foot pain and ankle pain that she is experiencing with walking. Is beginning to affect her back and actually her other foot. She is not able to wear the brace that was made because this seems to hurt her foot more. She has an appointment scheduled with the orthopedist for follow-up toward the end of the month and she plans to go back to see if he can really adjust this at that time. She sees a cardiologist regularly and does take a blood thinner because of her atrial fibrillation. She denies any chest pain or shortness of breath anymore than usual she is not having any trouble with her intestinal tract with nausea vomiting heartburn indigestion blood in the stool or black tarry bowel movements. She is passing her water without problems but does have occasional pain and does see the urologist because of a left kidney stone.      Patient Active Problem List   Diagnosis Date Noted  . Irritable bowel syndrome with diarrhea 12/11/2015  . UTI (urinary tract infection) 08/22/2015  . Cough 07/26/2015    . Diarrhea 11/30/2014  . Heme + stool 11/30/2014  . Chronic anticoagulation 11/30/2014  . CHF (congestive heart failure) ([place]) 04/30/2014  . 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 Osteoporosis 01/27/2014  . Fibromyalgia 08/09/2013  . Asthma 08/01/2010  . Hypothyroidism 03/20/2010  . Depression 03/20/2010  . HTN (hypertension) 03/20/2010  . ESOPHAGEAL MOTILITY DISORDER 03/20/2010  . Hyperlipidemia 04/22/2009  . Cor athrscl-uns vessel 04/22/2009  . ATRIAL FIBRILLATION, PAROXYSMAL 04/22/2009  . ESOPHAGEAL STRICTURE 10/31/2004  . GERD 10/31/2004  . HIATAL HERNIA 10/06/2001   Outpatient Encounter Prescriptions as of 08/28/2016  Medication Sig  . acetaminophen (TYLENOL) 500 MG tablet Take 500-1,000 mg by mouth every 8 (eight) hours as needed.  . ALLERGY RELIEF 10 MG tablet TAKE 1 TABLET ONCE A DAY  . ALPRAZolam (XANAX) 0.5 MG tablet TAKE 2 TABLETS AT BEDTIME  . Ascorbic Acid (VITAMIN C PO) Take 1 tablet by mouth every morning.  . benzonatate (TESSALON) 100 MG capsule TAKE 1 CAPSULE TWICE DAILY AS NEEDED FOR COUGH  . budesonide (PULMICORT) 0.5 MG/2ML nebulizer solution Take 2 mLs (0.5 mg total) by nebulization 2 (two) times daily. Dx 496  . Calcium Carbonate-Vitamin D (CALCIUM 600+D) 600-400 MG-UNIT per tablet Take 1 tablet by mouth daily at 6 PM.   . cholecalciferol (VITAMIN D) 1000 UNITS tablet Take 1,000 Units by mouth 2 (two) times daily with breakfast and lunch.   . Coenzyme Q10 (COQ10) 100 MG CAPS Take 100 mg by mouth daily.  Marland Kitchen denosumab (PROLIA) 60 MG/ML SOLN injection  Inject 60 mg into the skin every 6 (six) months. Administer in upper arm, thigh, or abdomen  . dextromethorphan-guaiFENesin (MUCINEX DM) 30-600 MG 12hr tablet Take 1 tablet by mouth 2 (two) times daily.  Marland Kitchen dicyclomine (BENTYL) 10 MG capsule TAKE (1) OR (2) CAPSULES EVERY SIX HOURS AS NEEDED FOR SPASM  . diltiazem (CARDIZEM CD) 120 MG 24 hr capsule TAKE (1) CAPSULE DAILY  . ESTRACE VAGINAL 0.1 MG/GM vaginal  cream Apply twice a week  . fluticasone (FLONASE) 50 MCG/ACT nasal spray 2 SPRAYS IN EACH NOSTRIL ONCE A DAY  . HYDROcodone-acetaminophen (NORCO) 5-325 MG tablet Take 1 tablet by mouth every 6 (six) hours as needed for moderate pain.  Marland Kitchen levothyroxine (SYNTHROID, LEVOTHROID) 25 MCG tablet TAKE 2 TABLETS DAILY AS DIRECTED  . loratadine (CLARITIN) 10 MG tablet Take 10 mg by mouth daily.   . meclizine (ANTIVERT) 25 MG tablet Take 1 tablet (25 mg total) by mouth 3 (three) times daily as needed for dizziness.  . Melatonin 3 MG TABS Take 1-2 tablets (3-6 mg total) by mouth at bedtime as needed.  . metoprolol tartrate (LOPRESSOR) 25 MG tablet Take 1 tablet (25 mg total) by mouth 2 (two) times daily.  . montelukast (SINGULAIR) 10 MG tablet TAKE 1 TABLET ONCE A DAY  . Multiple Vitamin (MULTIVITAMIN WITH MINERALS) TABS Take 1 tablet by mouth daily.  Marland Kitchen NITROSTAT 0.4 MG SL tablet 1 tablet under the tongue every 5 minutes as needed for chest pain.  Marland Kitchen omega-3 acid ethyl esters (LOVAZA) 1 g capsule TAKE (2) CAPSULES TWICE DAILY.  Marland Kitchen omeprazole-sodium bicarbonate (ZEGERID) 40-1100 MG capsule TAKE (1) CAPSULE DAILY  . ondansetron (ZOFRAN) 4 MG tablet TAKE 1 TABLET EVERY 12 HOURS AS NEEDED FOR NAUSEA  . PROAIR HFA 108 (90 BASE) MCG/ACT inhaler USE 2 PUFFS EVERY 6 HOURS AS NEEDED FOR WHEEZING  . rosuvastatin (CRESTOR) 20 MG tablet TAKE 1 TABLET ONCE A DAY  . sertraline (ZOLOFT) 100 MG tablet TAKE 1 TABLET ONCE A DAY  . XARELTO 20 MG TABS tablet TAKE 1 TABLET ONCE A DAY  . [DISCONTINUED] ciprofloxacin (CIPRO) 500 MG tablet Take 1 tablet (500 mg total) by mouth 2 (two) times daily.   No facility-administered encounter medications on file as of 08/28/2016.      Review of Systems  Constitutional: Negative.   HENT: Positive for ear pain (left ).   Eyes: Negative.   Respiratory: Negative.   Cardiovascular: Negative.   Gastrointestinal: Negative.   Endocrine: Negative.   Genitourinary: Negative.     Musculoskeletal: Positive for arthralgias (right foot pain).  Skin: Negative.   Allergic/Immunologic: Negative.   Neurological: Negative.   Hematological: Negative.   Psychiatric/Behavioral: Negative.        Objective:   Physical Exam  Constitutional: She is oriented to person, place, and time. She appears well-developed and well-nourished.  HENT:  Head: Normocephalic and atraumatic.  Right Ear: External ear normal.  Left Ear: External ear normal.  Nose: Nose normal.  Mouth/Throat: Oropharynx is clear and moist.  Both TMs were normal and there was minimal nasal congestion in the throat was clear.  Eyes: Conjunctivae and EOM are normal. Pupils are equal, round, and reactive to light. Right eye exhibits no discharge. Left eye exhibits no discharge. No scleral icterus.  Neck: Normal range of motion. Neck supple. No thyromegaly present.  Cardiovascular: Normal rate, regular rhythm and normal heart sounds.   No murmur heard. Heart is irregular irregular at 64/m  Pulmonary/Chest: Effort normal and breath  sounds normal. No respiratory distress. She has no wheezes. She has no rales.  Abdominal: Soft. Bowel sounds are normal. She exhibits no mass. There is no tenderness. There is no rebound and no guarding.  The abdomen was non-tender to palpation with no liver or spleen enlargement and no suprapubic tenderness.  Musculoskeletal: Normal range of motion. She exhibits deformity. She exhibits no edema or tenderness.  The patient basically has a deformed right ankle. It is everted laterally and there is a prominent bony growth on the plantar surface of the foot. There is minimal swelling and redness and there is no erythema or edema. It is tender to palpation.  Lymphadenopathy:    She has no cervical adenopathy.  Neurological: She is alert and oriented to person, place, and time. She has normal reflexes. No cranial nerve deficit.  Skin: Skin is warm and dry. No rash noted.  Psychiatric: She has  a normal mood and affect. Her behavior is normal. Judgment and thought content normal.  Nursing note and vitals reviewed.   BP 125/76 (BP Location: Left Arm)   Pulse 64   Temp 97.4 F (36.3 C) (Oral)   Ht 5' (1.524 m)   Wt 154 lb (69.9 kg)   BMI 30.08 kg/m        Assessment & Plan:  1. Hyperlipidemia -Continue current treatment pending results of lab work - CBC with Differential/Platelet - Lipid panel  2. Hypothyroidism, unspecified hypothyroidism type -Continue current treatment pending results of lab work - CBC with Differential/Platelet - Thyroid Panel With TSH  3. Essential hypertension -Blood pressure is good today and she will continue with current treatment - BMP8+EGFR - CBC with Differential/Platelet - Hepatic function panel  4. Vitamin D deficiency -Continue with vitamin D replacement pending results of lab work - CBC with Differential/Platelet - VITAMIN D 25 Hydroxy (Vit-D Deficiency, Fractures)  5. Paroxysmal atrial fibrillation (HCC) -Continue follow-up with cardiology - CBC with Differential/Platelet  6. Fibromyalgia -Stay as active as possible and continue with current treatment - CBC with Differential/Platelet  7. Gastroesophageal reflux disease, esophagitis presence not specified -Continue with omeprazole - CBC with Differential/Platelet - Hepatic function panel  8. Nephrolithiasis -Follow-up with urology  9. Pain of right lower extremity -Follow-up with orthopedic surgeon for better prosthesis and brace adjustment  10. Low back pain with sciatica, sciatica laterality unspecified, unspecified back pain laterality -Hopefully getting the foot working better with less pain well and able your back to feel better also.   Meds ordered this encounter  Medications  . benzonatate (TESSALON) 100 MG capsule    Sig: TAKE 1 CAPSULE TWICE DAILY AS NEEDED FOR COUGH    Dispense:  30 capsule    Refill:  1   Patient Instructions                        Medicare Annual Wellness Visit  [facility] and the medical providers at [facility] strive to bring you the best medical care.  In doing so we not only want to address your current medical conditions and concerns but also to detect new conditions early and prevent illness, disease and health-related problems.    Medicare offers a yearly Wellness Visit which allows our clinical staff to assess your need for preventative services including immunizations, lifestyle education, counseling to decrease risk of preventable diseases and screening for fall risk and other medical concerns.    This visit is provided free of charge (no copay)  for all Medicare recipients. The clinical pharmacists at [facility] have begun to conduct these Wellness Visits which will also include a thorough review of all your medications.    As you primary medical provider recommend that you make an appointment for your Annual Wellness Visit if you have not done so already this year.  You may set up this appointment before you leave today or you may call back ([phone]) and schedule an appointment.  Please make sure when you call that you mention that you are scheduling your Annual Wellness Visit with the clinical pharmacist so that the appointment may be made for the proper length of time.     Continue current medications. Continue good therapeutic lifestyle changes which include good diet and exercise. Fall precautions discussed with patient. If an FOBT was given today- please return it to our front desk. If you are over 25 years old - you may need Prevnar 26 or the adult Pneumonia vaccine.  **Flu shots are available--- please call and schedule a FLU-CLINIC appointment**  After your visit with [place] today you will receive a survey in the mail or online from [employer] regarding your care with [place]. Please take a moment to fill this out. Your feedback is very important to [place] as you can  help [place] better understand your patient needs as well as improve your experience and satisfaction. WE CARE ABOUT YOU!!!   Keep appointment with orthopedist See if he can readjust the prosthesis for the right foot to take more pressure off and enable you to walk without as much pain Stress the importance to him of keeping you active Follow-up with urologist for kidney stone as planned Follow-up with cardiology as planned Do not forget to get flu shot early October    Arrie Senate MD

## 2016-08-28 NOTE — Patient Instructions (Addendum)
Medicare Annual Wellness Visit  [facility] and the medical providers at [facility] strive to bring you the best medical care.  In doing so we not only want to address your current medical conditions and concerns but also to detect new conditions early and prevent illness, disease and health-related problems.    Medicare offers a yearly Wellness Visit which allows our clinical staff to assess your need for preventative services including immunizations, lifestyle education, counseling to decrease risk of preventable diseases and screening for fall risk and other medical concerns.    This visit is provided free of charge (no copay) for all Medicare recipients. The clinical pharmacists at [facility] have begun to conduct these Wellness Visits which will also include a thorough review of all your medications.    As you primary medical provider recommend that you make an appointment for your Annual Wellness Visit if you have not done so already this year.  You may set up this appointment before you leave today or you may call back [phone]) and schedule an appointment.  Please make sure when you call that you mention that you are scheduling your Annual Wellness Visit with the clinical pharmacist so that the appointment may be made for the proper length of time.     Continue current medications. Continue good therapeutic lifestyle changes which include good diet and exercise. Fall precautions discussed with patient. If an FOBT was given today- please return it to our front desk. If you are over 75 years old - you may need Prevnar 81 or the adult Pneumonia vaccine.  **Flu shots are available--- please call and schedule a FLU-CLINIC appointment**  After your visit with [place] today you will receive a survey in the mail or online from [employer] regarding your care with [place]. Please take a moment to fill this out. Your feedback is very  important to [place] as you can help [place] better understand your patient needs as well as improve your experience and satisfaction. WE CARE ABOUT YOU!!!   Keep appointment with orthopedist See if he can readjust the prosthesis for the right foot to take more pressure off and enable you to walk without as much pain Stress the importance to him of keeping you active Follow-up with urologist for kidney stone as planned Follow-up with cardiology as planned Do not forget to get flu shot early October

## 2016-08-29 LAB — CBC WITH DIFFERENTIAL/PLATELET
Basophils Absolute: 0.1 10*3/uL (ref 0.0–0.2)
Basos: 1 %
EOS (ABSOLUTE): 0.3 10*3/uL (ref 0.0–0.4)
Eos: 4 %
Hematocrit: 37.2 % (ref 34.0–46.6)
Hemoglobin: 13 g/dL (ref 11.1–15.9)
Immature Grans (Abs): 0 10*3/uL (ref 0.0–0.1)
Immature Granulocytes: 0 %
Lymphocytes Absolute: 3.5 10*3/uL — ABNORMAL HIGH (ref 0.7–3.1)
Lymphs: 45 %
MCH: 31.9 pg (ref 26.6–33.0)
MCHC: 34.9 g/dL (ref 31.5–35.7)
MCV: 91 fL (ref 79–97)
Monocytes Absolute: 0.7 10*3/uL (ref 0.1–0.9)
Monocytes: 9 %
Neutrophils Absolute: 3.2 10*3/uL (ref 1.4–7.0)
Neutrophils: 41 %
Platelets: 278 10*3/uL (ref 150–379)
RBC: 4.08 x10E6/uL (ref 3.77–5.28)
RDW: 13.2 % (ref 12.3–15.4)
WBC: 7.8 10*3/uL (ref 3.4–10.8)

## 2016-08-29 LAB — LIPID PANEL
Chol/HDL Ratio: 2.3 ratio units (ref 0.0–4.4)
Cholesterol, Total: 114 mg/dL (ref 100–199)
HDL: 50 mg/dL (ref >39)
LDL Calculated: 34 mg/dL (ref 0–99)
Triglycerides: 152 mg/dL — ABNORMAL HIGH (ref 0–149)
VLDL Cholesterol Cal: 30 mg/dL (ref 5–40)

## 2016-08-29 LAB — BMP8+EGFR
BUN/Creatinine Ratio: 21 (ref 12–28)
BUN: 13 mg/dL (ref 8–27)
CO2: 26 mmol/L (ref 18–29)
Calcium: 9.6 mg/dL (ref 8.7–10.3)
Chloride: 94 mmol/L — ABNORMAL LOW (ref 96–106)
Creatinine, Ser: 0.62 mg/dL (ref 0.57–1.00)
GFR calc Af Amer: 100 mL/min/{1.73_m2} (ref >59)
GFR calc non Af Amer: 87 mL/min/{1.73_m2} (ref >59)
Glucose: 80 mg/dL (ref 65–99)
Potassium: 5 mmol/L (ref 3.5–5.2)
Sodium: 137 mmol/L (ref 134–144)

## 2016-08-29 LAB — THYROID PANEL WITH TSH
Free Thyroxine Index: 1.6 (ref 1.2–4.9)
T3 Uptake Ratio: 25 % (ref 24–39)
T4, Total: 6.5 ug/dL (ref 4.5–12.0)
TSH: 1.94 u[IU]/mL (ref 0.450–4.500)

## 2016-08-29 LAB — HEPATIC FUNCTION PANEL
ALT: 17 IU/L (ref 0–32)
AST: 23 IU/L (ref 0–40)
Albumin: 4.5 g/dL (ref 3.5–4.8)
Alkaline Phosphatase: 103 IU/L (ref 39–117)
Bilirubin Total: 0.4 mg/dL (ref 0.0–1.2)
Bilirubin, Direct: 0.11 mg/dL (ref 0.00–0.40)
Total Protein: 7.4 g/dL (ref 6.0–8.5)

## 2016-08-29 LAB — VITAMIN D 25 HYDROXY (VIT D DEFICIENCY, FRACTURES): Vit D, 25-Hydroxy: 53.2 ng/mL (ref 30.0–100.0)

## 2016-08-29 NOTE — Telephone Encounter (Signed)
Patient seen 09/13

## 2016-09-08 ENCOUNTER — Other Ambulatory Visit: Payer: Self-pay | Admitting: Pharmacist

## 2016-09-08 MED ORDER — DENOSUMAB 60 MG/ML ~~LOC~~ SOLN: 60.0000 mg | SUBCUTANEOUS | 0 refills | Status: DC

## 2016-09-09 DIAGNOSIS — R269 Unspecified abnormalities of gait and mobility: Secondary | ICD-10-CM | POA: Diagnosis not present

## 2016-09-09 DIAGNOSIS — M2012 Hallux valgus (acquired), left foot: Secondary | ICD-10-CM | POA: Diagnosis not present

## 2016-09-09 DIAGNOSIS — M24575 Contracture, left foot: Secondary | ICD-10-CM | POA: Diagnosis not present

## 2016-09-09 DIAGNOSIS — M21171 Varus deformity, not elsewhere classified, right ankle: Secondary | ICD-10-CM | POA: Diagnosis not present

## 2016-09-11 ENCOUNTER — Telehealth: Payer: Self-pay | Admitting: Pulmonary Disease

## 2016-09-11 ENCOUNTER — Ambulatory Visit: Payer: Medicare Other | Admitting: Pulmonary Disease

## 2016-09-11 MED ORDER — PREDNISONE 10 MG PO TABS: ORAL_TABLET | ORAL | 0 refills | Status: DC

## 2016-09-11 MED ORDER — AZITHROMYCIN 250 MG PO TABS: ORAL_TABLET | ORAL | 0 refills | Status: DC

## 2016-09-11 NOTE — Telephone Encounter (Signed)
Called spoke with pt and is aware of recs. Rx's sent in. Nothing further needed

## 2016-09-11 NOTE — Telephone Encounter (Signed)
Spoke with the pt  She is c/o wheezing, cough with brown sputum and increased SOB "feels like early stages of bronchitis" She had appt with Brandi today, but cancelled b/c she feels weak and she had no one to drive her  She has not been seen in over a year and states has been doing well until 1 wk ago when these symptoms started  No f/c/s, CP or other co's  Please advise thanks!  Allergies  Allergen Reactions  . Aspirin Other (See Comments)    REACTION: regular strength causes "heart to beat fast"  . Captopril Hypertension  . Naproxen Other (See Comments)    Tongue swelling  . Penicillins     Swelling around site  . Sulfonamide Derivatives Hives  . Clindamycin/Lincomycin

## 2016-09-11 NOTE — Telephone Encounter (Signed)
ZPAK PREDNISONE if wheezing Prednisone 10 mg tabs  Take 2 tabs daily with food x 5ds, then 1 tab daily with food x 5ds then STOP

## 2016-09-12 ENCOUNTER — Other Ambulatory Visit: Payer: Self-pay | Admitting: Internal Medicine

## 2016-09-12 ENCOUNTER — Other Ambulatory Visit: Payer: Self-pay | Admitting: Family Medicine

## 2016-09-12 NOTE — Telephone Encounter (Signed)
Alprazolam last filled 08/20/2016. If approved please route to pool and have nurse call into pharmacy

## 2016-09-13 ENCOUNTER — Telehealth: Payer: Self-pay

## 2016-09-13 NOTE — Telephone Encounter (Signed)
Refill of Zegerid sent

## 2016-09-16 NOTE — Telephone Encounter (Signed)
Refill called to [facility] 

## 2016-09-23 ENCOUNTER — Ambulatory Visit (INDEPENDENT_AMBULATORY_CARE_PROVIDER_SITE_OTHER): Payer: Medicare Other

## 2016-09-23 DIAGNOSIS — Z23 Encounter for immunization: Secondary | ICD-10-CM

## 2016-10-03 ENCOUNTER — Ambulatory Visit (INDEPENDENT_AMBULATORY_CARE_PROVIDER_SITE_OTHER): Payer: Medicare Other | Admitting: Pharmacist

## 2016-10-03 ENCOUNTER — Ambulatory Visit (INDEPENDENT_AMBULATORY_CARE_PROVIDER_SITE_OTHER): Payer: Medicare Other

## 2016-10-03 ENCOUNTER — Encounter: Payer: Self-pay | Admitting: Pharmacist

## 2016-10-03 VITALS — BP 132/78 | HR 64 | Ht 60.0 in | Wt 154.0 lb

## 2016-10-03 DIAGNOSIS — Z0000 Encounter for general adult medical examination without abnormal findings: Secondary | ICD-10-CM

## 2016-10-03 DIAGNOSIS — M810 Age-related osteoporosis without current pathological fracture: Secondary | ICD-10-CM

## 2016-10-03 MED ORDER — DENOSUMAB 60 MG/ML ~~LOC~~ SOLN
60.0000 mg | Freq: Once | SUBCUTANEOUS | Status: AC
  Administered 2016-10-03: 60 mg via SUBCUTANEOUS

## 2016-10-03 NOTE — Patient Instructions (Addendum)
[name] , Thank you for taking time to come for your Medicare Wellness Visit. I appreciate your ongoing commitment to your health goals. Please review the following plan we discussed and let me know if I can assist you in the future.   These are the goals we discussed:  Continue to use cane for support when walking.   Look for copy of [place] (important to know where these are kept - you can also bring copy to our office to be placed in our file / electronic chart)    This is a list of the screening recommended for you and due dates:  Health Maintenance  Topic Date Due  . DEXA scan (bone density measurement)  09/2018 - done today  . Tetanus Vaccine  09/09/2021  . Flu Shot  Completed  . Shingles Vaccine  Completed  . Pneumonia vaccines  Completed   Fall Prevention in the Home  Falls can cause injuries and can affect people from all age groups. There are many simple things that you can do to make your home safe and to help prevent falls. WHAT CAN I DO ON THE OUTSIDE OF MY HOME?  Regularly repair the edges of walkways and driveways and fix any cracks.  Remove high doorway thresholds.  Trim any shrubbery on the main path into your home.  Use bright outdoor lighting.  Clear walkways of debris and clutter, including tools and rocks.  Regularly check that handrails are securely fastened and in good repair. Both sides of any steps should have handrails.  Install guardrails along the edges of any raised decks or porches.  Have leaves, snow, and ice cleared regularly.  Use sand or salt on walkways during winter months.  In the garage, clean up any spills right away, including grease or oil spills. WHAT CAN I DO IN THE BATHROOM?  Use night lights.  Install grab bars by the toilet and in the tub and shower. Do not use towel bars as grab bars.  Use non-skid mats or decals on the floor of the tub or shower.  If you  need to sit down while you are in the shower, use a plastic, non-slip stool.Marland Kitchen  Keep the floor dry. Immediately clean up any water that spills on the floor.  Remove soap buildup in the tub or shower on a regular basis.  Attach bath mats securely with double-sided non-slip rug tape.  Remove throw rugs and other tripping hazards from the floor. WHAT CAN I DO IN THE BEDROOM?  Use night lights.  Make sure that a bedside light is easy to reach.  Do not use oversized bedding that drapes onto the floor.  Have a firm chair that has side arms to use for getting dressed.  Remove throw rugs and other tripping hazards from the floor. WHAT CAN I DO IN THE KITCHEN?   Clean up any spills right away.  Avoid walking on wet floors.  Place frequently used items in easy-to-reach places.  If you need to reach for something above you, use a sturdy step stool that has a grab bar.  Keep electrical cables out of the way.  Do not use floor polish or wax that makes floors slippery. If you have to use wax, make sure that it is non-skid floor wax.  Remove throw rugs and other tripping hazards from the floor. WHAT CAN I DO IN THE STAIRWAYS?  Do not leave any items on the  stairs.  Make sure that there are handrails on both sides of the stairs. Fix handrails that are broken or loose. Make sure that handrails are as long as the stairways.  Check any carpeting to make sure that it is firmly attached to the stairs. Fix any carpet that is loose or worn.  Avoid having throw rugs at the top or bottom of stairways, or secure the rugs with carpet tape to prevent them from moving.  Make sure that you have a light switch at the top of the stairs and the bottom of the stairs. If you do not have them, have them installed. WHAT ARE SOME OTHER FALL PREVENTION TIPS?  Wear closed-toe shoes that fit well and support your feet. Wear shoes that have rubber soles or low heels.  When you use a stepladder, make sure that  it is completely opened and that the sides are firmly locked. Have someone hold the ladder while you are using it. Do not climb a closed stepladder.  Add color or contrast paint or tape to grab bars and handrails in your home. Place contrasting color strips on the first and last steps.  Use mobility aids as needed, such as canes, walkers, scooters, and crutches.  Turn on lights if it is dark. Replace any light bulbs that burn out.  Set up furniture so that there are clear paths. Keep the furniture in the same spot.  Fix any uneven floor surfaces.  Choose a carpet design that does not hide the edge of steps of a stairway.  Be aware of any and all pets.  Review your medicines with your healthcare provider. Some medicines can cause dizziness or changes in blood pressure, which increase your risk of falling. Talk with your health care provider about other ways that you can decrease your risk of falls. This may include working with a physical therapist or trainer to improve your strength, balance, and endurance.   This information is not intended to replace advice given to you by your health care provider. Make sure you discuss any questions you have with your health care provider.   Document Released: 11/22/2002 Document Revised: 04/18/2015 Document Reviewed: 01/06/2015 Elsevier Interactive Patient Education [employer].

## 2016-10-03 NOTE — Progress Notes (Signed)
Patient ID: [name], female   DOB: 08/19/1936, 80 y.o.   MRN: [id number]    Subjective:   [name] is a 80 y.o. widowed, white  female who presents for a Subsequent Medicare Annual Wellness Visit and to received Prolia injection.    [name] lives alone.  Has 1 son who lives in [place].    [name] reports that her biggest health concern is back pain.  She has consulted with orthopedist about 1 week ago and she is considering a procedure to help with back pain.  She has had several steroid injections that have not brought relief of back pain.   She take hydrocodone / APAP 5/325mg  usually only 2 times a day but can take up to every 6 hours as needed.  She will also use APAP 500-650mg  1-2 times a day in between.    Patient is here to receive Prolia injection for osteoporosis. She took Forteo for 2 years from 18/2012 to 07/2013 with good results but needed something to help maintain BMD built during [facility] therapy.  She is unable to take bisphosphonates due to esophogeal pain. She has been taking Prolia injections every 6 months since 12/2013.   Current Medications (verified) Outpatient Encounter Prescriptions as of 10/03/2016  Medication Sig  . acetaminophen (TYLENOL) 500 MG tablet Take 500-1,000 mg by mouth every 8 (eight) hours as needed.  . ALPRAZolam (XANAX) 0.5 MG tablet TAKE 2 TABLETS AT BEDTIME  . Ascorbic Acid (VITAMIN C PO) Take 1 tablet by mouth every morning.  . benzonatate (TESSALON) 100 MG capsule TAKE 1 CAPSULE TWICE DAILY AS NEEDED FOR COUGH  . budesonide (PULMICORT) 0.5 MG/2ML nebulizer solution Take 2 mLs (0.5 mg total) by nebulization 2 (two) times daily. Dx 496  . Calcium Carbonate-Vitamin D (CALCIUM 600+D) 600-400 MG-UNIT per tablet Take 1 tablet by mouth daily at 6 PM.   . cholecalciferol (VITAMIN D) 1000 UNITS tablet Take 1,000 Units by mouth 2 (two) times daily with breakfast and lunch.   . Coenzyme Q10 (COQ10) 100 MG CAPS Take 100 mg by mouth daily.  Marland Kitchen  denosumab (PROLIA) 60 MG/ML SOLN injection Inject 60 mg into the skin every 6 (six) months. Administer in upper arm, thigh, or abdomen  . dextromethorphan-guaiFENesin (MUCINEX DM) 30-600 MG 12hr tablet Take 1 tablet by mouth 2 (two) times daily.  Marland Kitchen dicyclomine (BENTYL) 10 MG capsule TAKE (1) OR (2) CAPSULES EVERY SIX HOURS AS NEEDED FOR SPASM  . diltiazem (CARDIZEM CD) 120 MG 24 hr capsule TAKE (1) CAPSULE DAILY  . ESTRACE VAGINAL 0.1 MG/GM vaginal cream Apply twice a week  . fluticasone (FLONASE) 50 MCG/ACT nasal spray 2 SPRAYS IN EACH NOSTRIL ONCE A DAY  . HYDROcodone-acetaminophen (NORCO) 5-325 MG tablet Take 1 tablet by mouth every 6 (six) hours as needed for moderate pain.  Marland Kitchen levothyroxine (SYNTHROID, LEVOTHROID) 25 MCG tablet TAKE 2 TABLETS DAILY AS DIRECTED  . loratadine (CLARITIN) 10 MG tablet Take 10 mg by mouth daily.   . meclizine (ANTIVERT) 25 MG tablet Take 1 tablet (25 mg total) by mouth 3 (three) times daily as needed for dizziness.  . Melatonin 3 MG TABS Take 1-2 tablets (3-6 mg total) by mouth at bedtime as needed.  . metoprolol tartrate (LOPRESSOR) 25 MG tablet Take 1 tablet (25 mg total) by mouth 2 (two) times daily.  . montelukast (SINGULAIR) 10 MG tablet TAKE 1 TABLET ONCE A DAY  . Multiple Vitamin (MULTIVITAMIN WITH MINERALS) TABS Take 1 tablet by  mouth daily.  Marland Kitchen NITROSTAT 0.4 MG SL tablet 1 tablet under the tongue every 5 minutes as needed for chest pain.  Marland Kitchen omega-3 acid ethyl esters (LOVAZA) 1 g capsule TAKE (2) CAPSULES TWICE DAILY.  Marland Kitchen omeprazole-sodium bicarbonate (ZEGERID) 40-1100 MG capsule TAKE (1) CAPSULE DAILY  . ondansetron (ZOFRAN) 4 MG tablet TAKE 1 TABLET EVERY 12 HOURS AS NEEDED FOR NAUSEA  . PROAIR HFA 108 (90 BASE) MCG/ACT inhaler USE 2 PUFFS EVERY 6 HOURS AS NEEDED FOR WHEEZING  . rosuvastatin (CRESTOR) 20 MG tablet TAKE 1 TABLET ONCE A DAY  . sertraline (ZOLOFT) 100 MG tablet TAKE 1 TABLET ONCE A DAY  . XARELTO 20 MG TABS tablet TAKE 1 TABLET ONCE A DAY    No facility-administered encounter medications on file as of 10/03/2016.     Allergies (verified) Aspirin; Captopril; Naproxen; Penicillins; Sulfonamide derivatives; and Clindamycin/lincomycin   History: Past Medical History:  Diagnosis Date  . Anxiety   . Anxiety disorder   . Arthritis   . Asthmatic bronchitis   . Atrial fibrillation ([place])   . Bowel obstruction    blockage  . CAD (coronary artery disease)    Stent to RI 2003.  Myoview 2013 no ischemia.  . Cataract   . Chronic bronchitis ([place])   . Colon polyp    adenomatous  . Congestive heart disease ([facility])    Preserved EF  . Encephalitis    d/t meningitis  . Esophageal motility disorder   . Fibromyalgia   . GERD (gastroesophageal reflux disease)   . Hyperlipidemia   . Hypertension   . Hypothyroid   . 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 Nephrolithiasis   . OA (osteoarthritis)   . Pleural effusion on right   . PONV (postoperative nausea and vomiting)    history of cardiac arrest day 1 post surgery  in 2008  . Status post dilation of esophageal narrowing   . Vitamin D deficiency    Past Surgical History:  Procedure Laterality Date  . ANKLE FUSION  08/27/2012   Procedure: ARTHRODESIS ANKLE;  Surgeon: Wylene Simmer, MD;  Location: [facility];  Service: Orthopedics;  Laterality: Right;  Arthrodesis right ankle and subtalar joint  . ARTHRODESIS TIBIOFIBULAR Right 03/31/2014   Procedure: REVISION OF SUBTALOR ARTHRODESIS  RIGHT ;  Surgeon: Wylene Simmer, MD;  Location: [facility];  Service: Orthopedics;  Laterality: Right;  . BACK SURGERY    . BRONCHOSCOPY    . CORONARY ANGIOPLASTY WITH STENT PLACEMENT  2003  . DECORTICATION Right 05/03/2014   Procedure: DECORTICATION;  Surgeon: [name], MD;  Location: [facility];  Service: Thoracic;  Laterality: Right;  . EYE SURGERY  2016   cateracts  . FOOT ARTHRODESIS, SUBTALAR Right 2013  . HARDWARE REMOVAL Right 03/31/2014   Procedure: REMOVAL OF DEEP IMPLANTS  X 3  RIGHT ;  Surgeon: Wylene Simmer, MD;  Location: [facility];  Service: Orthopedics;  Laterality: Right;  . HARDWARE REMOVAL Right 11/03/2014   Procedure: HARDWARE REMOVAL OF SUBTALAR JOINT;  Surgeon: Wylene Simmer, MD;  Location: [facility];  Service: Orthopedics;  Laterality: Right;  . HARDWARE REVISION  03/31/2014   SUBTALOR  ARTHRODESIS       DR HEWITT  . KNEE ARTHROSCOPY  03/26/2012   Procedure: ARTHROSCOPY KNEE;  Surgeon: Wylene Simmer, MD;  Location: [facility];  Service: Orthopedics;  Laterality: Left;  with Debridement of Lateral Meniscus tear  . REMOVAL OF IMPLANT Right 03/31/2014  DR HEWITT  . ROTATOR CUFF REPAIR Bilateral   . SINUS SURGERY WITH INSTATRAK    . TOTAL KNEE ARTHROPLASTY Right   . VIDEO ASSISTED THORACOSCOPY (VATS)/EMPYEMA Right 05/03/2014   Procedure: VIDEO ASSISTED THORACOSCOPY (VATS)/EMPYEMA;  Surgeon: [name], MD;  Location: [facility];  Service: Thoracic;  Laterality: Right;  Marland Kitchen VIDEO BRONCHOSCOPY N/A 05/03/2014   Procedure: VIDEO BRONCHOSCOPY;  Surgeon: [name], MD;  Location: [facility] OR;  Service: Thoracic;  Laterality: N/A;   Family History  Problem Relation Age of Onset  . Prostate cancer Brother   . Cancer Brother     PROSTATE  . Heart disease Mother   . Asthma Mother   . Congestive Heart Failure Mother   . Emphysema Sister   . COPD Sister   . Stroke Sister   . Heart disease Sister   . Emphysema Brother   . Rheumatologic disease Neg Hx    Social History   Occupational History  . Retired    Social History Main Topics  . Smoking status: Former Smoker    Packs/day: 1.00    Years: 30.00    Types: Cigarettes    Quit date: 12/16/1989  . Smokeless tobacco: Never Used     Comment: smoked off & on  . Alcohol use No  . Drug use: No  . Sexual activity: No    Do you feel safe at home?  Yes   Dietary issues and exercise activities: Patient's exercise is limited by back pain  Current Dietary habits:  Patient is not cooking as much  because she cannot stand for extended periods of time.  She is trying to get 5 fruits or vegetables every day.  Objective:    Today's Vitals   10/03/16 1444  BP: 132/78  Pulse: 64  Weight: 154 lb (69.9 kg)  Height: 5' (1.524 m)  PainSc: 6   PainLoc: Back   Body mass index is 30.08 kg/m.   DEXA Results Date of Test T-Score for AP Spine L1-L4 T-Score for Neck of Left Hip  10/03/2016 -0.8 -1.8  07/27/2014 -1.5 -1.7  07/15/2012 -1.8` -17  05/29/2011 -2.2 -1.9  02/16/2007 -2.1 -1.7   Lowest T-Score at L-1 was -3.2 *(05/29/2011) T-Score for L-1 today was -1.8   Activities of Daily Living In your present state of health, do you have any difficulty performing the following activities: 10/03/2016  Hearing? N  Vision? N  Difficulty concentrating or making decisions? N  Walking or climbing stairs? Y  Dressing or bathing? N  Doing errands, shopping? N  Preparing Food and eating ? N  Using the Toilet? N  In the past six months, have you accidently leaked urine? N  Do you have problems with loss of bowel control? N  Managing your Medications? N  Managing your Finances? N  Housekeeping or managing your Housekeeping? N  Some recent data might be hidden    Are there smokers in your home (other than you)? No   Cardiac Risk Factors include: advanced age (>66men, >22 women);dyslipidemia;hypertension;obesity (BMI >30kg/m2);sedentary lifestyle  Depression Screen PHQ 2/9 Scores 10/03/2016 08/28/2016 03/07/2016 10/31/2015  PHQ - 2 Score 0 0 0 1  PHQ- 9 Score - - - -    Fall Risk Fall Risk  10/03/2016 08/28/2016 03/07/2016 10/31/2015 09/22/2015  Falls in the past year? Yes Yes No Yes Yes  Number falls in past yr: 2 or more 2 or more - 2 or more 1  Injury with Fall? No No - No  Yes  Risk Factor Category  High Fall Risk - - - High Fall Risk  Risk for fall due to : - - - - History of fall(s);Impaired balance/gait;Impaired mobility  Follow up Falls prevention discussed - - - Falls  evaluation completed;Falls prevention discussed    Cognitive Function: MMSE - Mini Mental State Exam 10/03/2016 09/22/2015 08/09/2013  Orientation to time 5 5 5   Orientation to Place 5 5 5   Registration 3 3 3   Attention/ Calculation 3 4 5   Recall 2 3 1   Language- name 2 objects 2 2 2   Language- repeat 1 1 1   Language- follow 3 step command 3 3 3   Language- read & follow direction 1 1 1   Write a sentence 1 1 1   Copy design 1 1 1   Total score 27 29 28     Immunizations and Health Maintenance Immunization History  Administered Date(s) Administered  . Influenza Split 08/28/2012  . Influenza Whole 09/18/2009  . Influenza, High Dose Seasonal PF 09/23/2016  . Influenza,inj,Quad PF,36+ Mos 10/05/2013, 09/22/2015  . Influenza,inj,quad, With Preservative 09/12/2014  . Pneumococcal Conjugate-13 01/13/2014  . Pneumococcal Polysaccharide-23 08/28/2012  . Tdap 09/10/2011   Health Maintenance Due  Topic Date Due  . DEXA SCAN  07/27/2016    Patient Care Team: Chipper Herb, MD as PCP - General Minus Breeding, MD as Consulting Physician (Cardiology) Mamie Laurel. [place], DPM as Financial risk analyst Physician (Podiatry) Irene Shipper, MD as Consulting Physician (Gastroenterology)  Indicate any recent Medical Services you may have received from other than [facility] providers in the past year (date may be approximate).    Assessment:    Annual Wellness Visit  Osteoporosis Obesity - Weight is down 5# since last AWV 1 year ago High risk medications  Screening Tests Health Maintenance  Topic Date Due  . DEXA SCAN  07/27/2016  . TETANUS/TDAP  09/09/2021  . INFLUENZA VACCINE  Completed  . ZOSTAVAX  Completed  . PNA vac Low Risk Adult  Completed        Plan:   During the course of the visit Fredricka was educated and counseled about the following appropriate screening and preventive services:   Vaccines to include Pneumoccal, Influenza, Hepatitis B, Td, Zostavax -  All vaccines are UTD  Colorectal  cancer screening - UTD on colonoscopy and FOBT  Cardiovascular disease screening - patient sees Dr Jenkins [place] regularly - next appt 01/2017.   Lipids - last LDL was at goal and patient is taking crestor  BP was at goal today    Diabetes screening - last FBG was 80 - UTD  Bone Denisty / Osteoporosis Screening - done today - next recheck 09/2018  Continue Prolia for 1 more year.    Prolia 60mg  given SQ today - administered today  Continue calcium intake from diet and supplementation - goal is 1200mg  daily  Continue vitamin D intake  Mammogram - patient no longer gets   Glaucoma screening / Eye Exam - UTD  Advanced Directives - UTD, copy requested  Discussed weight and limiting caloric intake, continue to eat at least 5 fruits and vegetables per day.  Continue with follow up and plan from orthopedist.   Discussed that she is taking several high risk medications - hydrocodone / APAP (cannot decrease or stop at this time due to severe back pain but may have procedure that will help with pain), alprazolam (hoping that once back pain is better patient can decrease alprazolam for sleep), meclizine (patient is taking only as needed  which is usually less than 2 times per month.)   Patient Instructions (the written plan) were given to the patient.   Cherre Robins, PharmD, CPP, CDE  10/03/2016        Patient ID: [name], female   DOB: 1936-10-02, 80 y.o.   MRN: [id number]

## 2016-10-05 DIAGNOSIS — M47816 Spondylosis without myelopathy or radiculopathy, lumbar region: Secondary | ICD-10-CM | POA: Diagnosis not present

## 2016-10-05 DIAGNOSIS — M5416 Radiculopathy, lumbar region: Secondary | ICD-10-CM | POA: Diagnosis not present

## 2016-10-05 DIAGNOSIS — M5136 Other intervertebral disc degeneration, lumbar region: Secondary | ICD-10-CM | POA: Diagnosis not present

## 2016-10-11 ENCOUNTER — Other Ambulatory Visit: Payer: Self-pay | Admitting: Family Medicine

## 2016-10-14 ENCOUNTER — Other Ambulatory Visit: Payer: Self-pay | Admitting: Family Medicine

## 2016-10-14 NOTE — Telephone Encounter (Signed)
Please call in xanax with 1 refills 

## 2016-10-21 ENCOUNTER — Telehealth: Payer: Self-pay | Admitting: Family Medicine

## 2016-10-21 DIAGNOSIS — M7741 Metatarsalgia, right foot: Secondary | ICD-10-CM | POA: Diagnosis not present

## 2016-10-21 DIAGNOSIS — M21171 Varus deformity, not elsewhere classified, right ankle: Secondary | ICD-10-CM | POA: Diagnosis not present

## 2016-10-21 DIAGNOSIS — R269 Unspecified abnormalities of gait and mobility: Secondary | ICD-10-CM | POA: Diagnosis not present

## 2016-10-21 DIAGNOSIS — M12571 Traumatic arthropathy, right ankle and foot: Secondary | ICD-10-CM | POA: Diagnosis not present

## 2016-10-21 NOTE — Telephone Encounter (Signed)
Pt had misplaced her med and now has found it

## 2016-11-04 DIAGNOSIS — M7741 Metatarsalgia, right foot: Secondary | ICD-10-CM | POA: Diagnosis not present

## 2016-11-05 ENCOUNTER — Other Ambulatory Visit: Payer: Self-pay | Admitting: Family Medicine

## 2016-11-11 DIAGNOSIS — M25561 Pain in right knee: Secondary | ICD-10-CM | POA: Diagnosis not present

## 2016-11-11 DIAGNOSIS — Z96651 Presence of right artificial knee joint: Secondary | ICD-10-CM | POA: Diagnosis not present

## 2016-11-14 ENCOUNTER — Other Ambulatory Visit: Payer: Self-pay | Admitting: Nurse Practitioner

## 2016-11-14 NOTE — Telephone Encounter (Signed)
Last filled 10/15/16, last seen 08/28/16. Call in at [facility]

## 2016-12-03 DIAGNOSIS — G894 Chronic pain syndrome: Secondary | ICD-10-CM | POA: Diagnosis not present

## 2016-12-05 ENCOUNTER — Other Ambulatory Visit: Payer: Self-pay | Admitting: Family Medicine

## 2016-12-06 ENCOUNTER — Other Ambulatory Visit: Payer: Self-pay | Admitting: Family Medicine

## 2016-12-10 ENCOUNTER — Other Ambulatory Visit: Payer: Self-pay | Admitting: Nurse Practitioner

## 2016-12-10 NOTE — Telephone Encounter (Signed)
Please call in xanax with 1 refills 

## 2016-12-20 ENCOUNTER — Other Ambulatory Visit: Payer: Self-pay | Admitting: Family Medicine

## 2017-01-06 DIAGNOSIS — R269 Unspecified abnormalities of gait and mobility: Secondary | ICD-10-CM | POA: Diagnosis not present

## 2017-01-06 DIAGNOSIS — M24575 Contracture, left foot: Secondary | ICD-10-CM | POA: Diagnosis not present

## 2017-01-06 DIAGNOSIS — M21171 Varus deformity, not elsewhere classified, right ankle: Secondary | ICD-10-CM | POA: Diagnosis not present

## 2017-01-06 DIAGNOSIS — M12571 Traumatic arthropathy, right ankle and foot: Secondary | ICD-10-CM | POA: Diagnosis not present

## 2017-01-06 DIAGNOSIS — M7741 Metatarsalgia, right foot: Secondary | ICD-10-CM | POA: Diagnosis not present

## 2017-01-08 ENCOUNTER — Ambulatory Visit: Payer: Medicare Other | Admitting: Family Medicine

## 2017-01-08 ENCOUNTER — Other Ambulatory Visit: Payer: Self-pay | Admitting: Family Medicine

## 2017-01-08 ENCOUNTER — Other Ambulatory Visit: Payer: Self-pay | Admitting: Internal Medicine

## 2017-01-09 ENCOUNTER — Encounter: Payer: Self-pay | Admitting: Family Medicine

## 2017-01-17 ENCOUNTER — Encounter: Payer: Self-pay | Admitting: Internal Medicine

## 2017-01-20 ENCOUNTER — Ambulatory Visit (INDEPENDENT_AMBULATORY_CARE_PROVIDER_SITE_OTHER): Payer: Medicare Other | Admitting: Physician Assistant

## 2017-01-20 ENCOUNTER — Encounter: Payer: Self-pay | Admitting: Physician Assistant

## 2017-01-20 VITALS — BP 137/80 | HR 61 | Ht 60.0 in | Wt 151.0 lb

## 2017-01-20 DIAGNOSIS — I10 Essential (primary) hypertension: Secondary | ICD-10-CM

## 2017-01-20 DIAGNOSIS — I5032 Chronic diastolic (congestive) heart failure: Secondary | ICD-10-CM | POA: Diagnosis not present

## 2017-01-20 DIAGNOSIS — Z7901 Long term (current) use of anticoagulants: Secondary | ICD-10-CM

## 2017-01-20 DIAGNOSIS — I480 Paroxysmal atrial fibrillation: Secondary | ICD-10-CM

## 2017-01-20 DIAGNOSIS — Z01818 Encounter for other preprocedural examination: Secondary | ICD-10-CM | POA: Diagnosis not present

## 2017-01-20 DIAGNOSIS — I2510 Atherosclerotic heart disease of native coronary artery without angina pectoris: Secondary | ICD-10-CM

## 2017-01-20 NOTE — Progress Notes (Signed)
Cardiology Office Note   Date:  01/20/2017   ID:  [name], DOB 10-29-1936, MRN [id number]  PCP:  Redge Gainer, MD  Cardiologist:  Dr. Percival Spanish 01/24/2016 Rosaria Ferries, PA-C   Chief Complaint  Patient presents with  . Follow-up  . Shortness of Breath    occassionally.  . Chest Pain    burning sensation.  . Dizziness    vertigo    History of Present Illness: [name] is a 81 y.o. female with a history of HTN, D-CHF, PAF, HLD, hypothyroid, osteoporosis, back problems, Rotablator and 2.25 x 16-mm Express 2 stent to the ramus 2003, nonischemic Myoview 2013, CHA2DS2 - VASc score of 5 (age x 2, HTN, CHF, CAD), ?OSA w/ sleep study never completed.  [name] presents for cardiology evaluation.  She occasionally has burning or stinging pain in her L chest, behind her L breast. No exertional component. It will last just a few minutes. It does not scare her. She gets it less than once a month.  She has scales from [facility], that helps her monitor her weight and asks her screening questions. She likes the program. Her legs have not been swelling, except in her R ankle which has MS problems.   She also has R shoulder and back problems, so does not exercise. She is not very active. She needs a procedure on her back, she is to have something done on 02/16. That will lead to her getting a nerve stimulator put in later. The back pain and foot pain give her the most limitations to her activity.   She has not had DOE recently. She has help with housework and bringing groceries in. These activiities have made her SOB and given her back pain in the past.   Last week, she was going to the mailbox and got hot and sweaty, all of a sudden. This happens to her occasionally.   She has not had any atrial fib recently. She almost never feels palpitations. She is compliant with the Xarelto, no bleeding issues.   Her main symptoms prior to her PCI, her main symptom was fatigue and some pain  between her shoulder blades. She has not had that.  She has had a dry cough recently, worse late in the day with some wheezing. Her inhaler/nebs help. No fevers and the cough is non-productive.   Past Medical History:  Diagnosis Date  . Anxiety   . Anxiety disorder   . Arthritis   . Asthmatic bronchitis   . Atrial fibrillation ([place])   . Bowel obstruction    blockage  . CAD (coronary artery disease)    Stent to RI 2003.  Myoview 2013 no ischemia.  . Cataract   . Chronic bronchitis ([place])   . Colon polyp    adenomatous  . Congestive heart disease ([facility])    Preserved EF  . Encephalitis    d/t meningitis  . Esophageal motility disorder   . Fibromyalgia   . GERD (gastroesophageal reflux disease)   . Hyperlipidemia   . Hypertension   . Hypothyroid   . 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 Nephrolithiasis   . OA (osteoarthritis)   . Pleural effusion on right   . PONV (postoperative nausea and vomiting)    history of cardiac arrest day 1 post surgery  in 2008  . Status post dilation of esophageal narrowing   . Vitamin D deficiency     Past  Surgical History:  Procedure Laterality Date  . ANKLE FUSION  08/27/2012   Procedure: ARTHRODESIS ANKLE;  Surgeon: Wylene Simmer, MD;  Location: [place];  Service: Orthopedics;  Laterality: Right;  Arthrodesis right ankle and subtalar joint  . ARTHRODESIS TIBIOFIBULAR Right 03/31/2014   Procedure: REVISION OF SUBTALOR ARTHRODESIS  RIGHT ;  Surgeon: Wylene Simmer, MD;  Location: [place];  Service: Orthopedics;  Laterality: Right;  . BACK SURGERY    . BRONCHOSCOPY    . CORONARY ANGIOPLASTY WITH STENT PLACEMENT  2003  . DECORTICATION Right 05/03/2014   Procedure: DECORTICATION;  Surgeon: [name], MD;  Location: [place];  Service: Thoracic;  Laterality: Right;  . EYE SURGERY  2016   cateracts  . FOOT ARTHRODESIS, SUBTALAR Right 2013  . HARDWARE REMOVAL Right 03/31/2014   Procedure: REMOVAL OF DEEP IMPLANTS X 3  RIGHT  ;  Surgeon: Wylene Simmer, MD;  Location: [place];  Service: Orthopedics;  Laterality: Right;  . HARDWARE REMOVAL Right 11/03/2014   Procedure: HARDWARE REMOVAL OF SUBTALAR JOINT;  Surgeon: Wylene Simmer, MD;  Location: [place];  Service: Orthopedics;  Laterality: Right;  . HARDWARE REVISION  03/31/2014   SUBTALOR  ARTHRODESIS       DR HEWITT  . KNEE ARTHROSCOPY  03/26/2012   Procedure: ARTHROSCOPY KNEE;  Surgeon: Wylene Simmer, MD;  Location: [place];  Service: Orthopedics;  Laterality: Left;  with Debridement of Lateral Meniscus tear  . REMOVAL OF IMPLANT Right 03/31/2014   DR HEWITT  . ROTATOR CUFF REPAIR Bilateral   . SINUS SURGERY WITH INSTATRAK    . TOTAL KNEE ARTHROPLASTY Right   . VIDEO ASSISTED THORACOSCOPY (VATS)/EMPYEMA Right 05/03/2014   Procedure: VIDEO ASSISTED THORACOSCOPY (VATS)/EMPYEMA;  Surgeon: [name], MD;  Location: [place];  Service: Thoracic;  Laterality: Right;  Marland Kitchen VIDEO BRONCHOSCOPY N/A 05/03/2014   Procedure: VIDEO BRONCHOSCOPY;  Surgeon: [name], MD;  Location: [facility] OR;  Service: Thoracic;  Laterality: N/A;    Medication Sig  . acetaminophen (TYLENOL) 500 MG tablet Take 500-1,000 mg by mouth every 8 (eight) hours as needed.  . ALLERGY RELIEF 10 MG tablet TAKE 1 TABLET ONCE A DAY  . ALPRAZolam (XANAX) 0.5 MG tablet TAKE 2 TABLETS AT BEDTIME  . Ascorbic Acid (VITAMIN C PO) Take 1 tablet by mouth every morning.  . benzonatate (TESSALON) 100 MG capsule TAKE 1 CAPSULE TWICE DAILY AS NEEDED FOR COUGH  . budesonide (PULMICORT) 0.5 MG/2ML nebulizer solution Take 2 mLs (0.5 mg total) by nebulization 2 (two) times daily. Dx 496  . Calcium Carbonate-Vitamin D (CALCIUM 600+D) 600-400 MG-UNIT per tablet Take 1 tablet by mouth daily at 6 PM.   . cholecalciferol (VITAMIN D) 1000 UNITS tablet Take 1,000 Units by mouth 2 (two) times daily with breakfast and lunch.   . Coenzyme Q10 (COQ10) 100 MG CAPS Take 100 mg by mouth daily.  Marland Kitchen denosumab (PROLIA) 60 MG/ML  SOLN injection Inject 60 mg into the skin every 6 (six) months. Administer in upper arm, thigh, or abdomen  . dextromethorphan-guaiFENesin (MUCINEX DM) 30-600 MG 12hr tablet Take 1 tablet by mouth 2 (two) times daily.  Marland Kitchen dicyclomine (BENTYL) 10 MG capsule TAKE (1) OR (2) CAPSULES EVERY SIX HOURS AS NEEDED FOR SPASM  . diltiazem (CARDIZEM CD) 120 MG 24 hr capsule TAKE (1) CAPSULE DAILY  . ESTRACE VAGINAL 0.1 MG/GM vaginal cream Apply twice a week  . fluticasone (FLONASE) 50 MCG/ACT nasal spray 2 SPRAYS IN EACH NOSTRIL ONCE A  DAY  . HYDROcodone-acetaminophen (NORCO) 5-325 MG tablet Take 1 tablet by mouth every 6 (six) hours as needed for moderate pain.  Marland Kitchen levothyroxine (SYNTHROID, LEVOTHROID) 25 MCG tablet TAKE 2 TABLETS DAILY AS DIRECTED  . meclizine (ANTIVERT) 25 MG tablet TAKE (1) TABLET THREE TIMES DAILY AS NEEDED FOR DIZZINESS.  . Melatonin 3 MG TABS Take 1-2 tablets (3-6 mg total) by mouth at bedtime as needed.  . metoprolol tartrate (LOPRESSOR) 25 MG tablet Take 1 tablet (25 mg total) by mouth 2 (two) times daily.  . montelukast (SINGULAIR) 10 MG tablet TAKE 1 TABLET ONCE A DAY  . Multiple Vitamin (MULTIVITAMIN WITH MINERALS) TABS Take 1 tablet by mouth daily.  . nitroGLYCERIN (NITROSTAT) 0.4 MG SL tablet 1 tablet under the tongue every 5 minutes as needed for chest pain.  Marland Kitchen omega-3 acid ethyl esters (LOVAZA) 1 g capsule TAKE (2) CAPSULES TWICE DAILY.  Marland Kitchen omeprazole-sodium bicarbonate (ZEGERID) 40-1100 MG capsule TAKE (1) CAPSULE DAILY  . ondansetron (ZOFRAN) 4 MG tablet TAKE 1 TABLET EVERY 12 HOURS AS NEEDED FOR NAUSEA  . PROAIR HFA 108 (90 Base) MCG/ACT inhaler USE 2 PUFFS EVERY 6 HOURS AS NEEDED FOR WHEEZING  . rosuvastatin (CRESTOR) 20 MG tablet TAKE 1 TABLET ONCE A DAY  . sertraline (ZOLOFT) 100 MG tablet TAKE 1 TABLET ONCE A DAY  . XARELTO 20 MG TABS tablet TAKE 1 TABLET ONCE A DAY   No current facility-administered medications for this visit.     Allergies:   Aspirin; Captopril;  Naproxen; Penicillins; Sulfonamide derivatives; and Clindamycin/lincomycin    Social History:  The patient  reports that she quit smoking about 27 years ago. Her smoking use included Cigarettes. She has a 30.00 pack-year smoking history. She has never used smokeless tobacco. She reports that she does not drink alcohol or use drugs.   Family History:  The patient's family history includes Asthma in her mother; COPD in her sister; Cancer in her brother; Congestive Heart Failure in her mother; Emphysema in her brother and sister; Heart disease in her mother and sister; Prostate cancer in her brother; Stroke in her sister.    ROS:  Please see the history of present illness. All other systems are reviewed and negative.    PHYSICAL EXAM: VS:  BP (!) 143/79   Pulse 61   Ht 5' (1.524 m)   Wt 151 lb (68.5 kg)   BMI 29.49 kg/m  , BMI Body mass index is 29.49 kg/m. GEN: Well nourished, well developed, female in no acute distress  HEENT: normal for age  Neck: no JVD, no carotid bruit, no masses Cardiac: RRR; soft murmur, no rubs, or gallops Respiratory: decreased BS bases bilaterally, rales R base, normal work of breathing GI: soft, nontender, nondistended, + BS MS: no deformity or atrophy; no edema; distal pulses are 2+ in all 4 extremities   Skin: warm and dry, no rash Neuro:  Strength and sensation are intact Psych: euthymic mood, full affect   EKG:  EKG is ordered today. The ekg ordered today demonstrates SR, 1st deg AV Block, no acute ischemic changes.   Recent Labs: 08/28/2016: ALT 17; BUN 13; Creatinine, Ser 0.62; Platelets 278; Potassium 5.0; Sodium 137; TSH 1.940    Lipid Panel    Component Value Date/Time   CHOL 114 08/28/2016 1458   CHOL 124 08/11/2013 0947   TRIG 152 (H) 08/28/2016 1458   TRIG 127 03/07/2016 1142   TRIG 132 08/11/2013 0947   HDL 50 08/28/2016 1458  HDL 66 03/07/2016 1142   HDL 54 08/11/2013 0947   CHOLHDL 2.3 08/28/2016 1458   CHOLHDL 3.1 05/04/2009  0424   VLDL 20 05/04/2009 0424   LDLCALC 34 08/28/2016 1458   LDLCALC 72 09/20/2014 1200   LDLCALC 44 08/11/2013 0947     Wt Readings from Last 3 Encounters:  01/20/17 151 lb (68.5 kg)  10/03/16 154 lb (69.9 kg)  08/28/16 154 lb (69.9 kg)     Other studies Reviewed: Additional studies/ records that were reviewed today include: office notes, hospital records and testing.  ASSESSMENT AND PLAN:  1. Preop evaluation: pt is not having any ischemic symptoms, her CHF is at baseline. No further cardiac evaluation is needed before SCS is inserted.   2. Chronic diastolic CHF: Her weight is down and her respiratory status is at baseline. She is not on a diuretic. Continue daily weights and sodium restrictions.   3. CAD: Hx PCI RI 2003. She is on BB and statin, no ASA due to Xarelto  4. PAF: currently in SR, not having frequent palpitations. Continue Cardizem, metoprolol and Xarelto.  5. Chronic anticoagulation: no problems with Xarelto, ok to stop it temporarily for the surgical procedure.   6. HTN: her BP is a little high today. She does not believe her BP is running high, but does not check it often. She will ck daily for a while and let us know. Believe she would tolerate a small increase in her metoprolol.    Current medicines are reviewed at length with the patient today.  The patient does not have concerns regarding medicines.  The following changes have been made:  no change  Labs/ tests ordered today include:  No orders of the defined types were placed in this encounter.  Disposition:   FU with Dr Percival Spanish in a year.  Augusto Garbe  01/20/2017 3:58 PM    [facility] Phone: [phone]; Fax: [phone]  This note was written with the assistance of speech recognition software. Please excuse any transcriptional errors.

## 2017-01-20 NOTE — Patient Instructions (Signed)
Medication Instructions:  Your physician recommends that you continue on your current medications as directed. Please refer to the Current Medication list given to you today.  OK TO STOP XARELTO 3 DAYS PRIOR SURGERY-IF NEEDED   If you need a refill on your cardiac medications before your next appointment, please call your pharmacy.  Labwork: none  Follow-Up: Your physician wants you to follow-up in: [place] will receive a reminder letter in the mail two months in advance. If you don't receive a letter, please call our office IN December 2018 to schedule the follow-up appointment.   Special Instructions: PLEASE HAVE DR RAMOS' OFFICE FAX PRE-OP SURGICAL INSTRUCTIONS TO [place] AT [phone]  TAKE BLOOD PRESSURE DAILY AT DIFFERENT TIMES AND LOG, CALL us IF > 130/80   Thank you for choosing [facility] at [employer], LPN RHONDA BARRETT PA-C

## 2017-01-23 ENCOUNTER — Telehealth: Payer: Self-pay | Admitting: Cardiology

## 2017-01-23 NOTE — Telephone Encounter (Signed)
Returned call to patient.  She states [place], [place] saw her on 2/5 - see note below regarding BP Patient states [place], [place] wanted her BP no higher than [id number] systolic 6. HTN: her BP is a little high today. She does not believe her BP is running high, but does not check it often. She will ck daily for a while and let us know. Believe she would tolerate a small increase in her metoprolol.   She states her BP has been "way out of proportion at times" She has felt lightheaded all week and real sleepy/tired Denies chest pain & shortness of breath 2/6 144/65 (11am - after AM) 2/7 190/72 (noon) 2/7 146/69 (a little after noon) 2/7 150/75 2/7 139/79 2/8 183/91 (11:30pm) 2/9 152/85 (before breakfast, before meds) - HR 65 2/9 181/76 (about 2 hours later) - HR 63 ** patient reports HR runs low to mid 60s  Patient states she checks her BP after she has been resting - she is not very active anyway d/t foot and back issue  Will route to MD/PA for advice. Patient aware she should hear from either today or tomorrow. She is having back procedure next Friday Feb 16 (stimulator) and wants to make sure all is OK

## 2017-01-23 NOTE — Telephone Encounter (Signed)
New Message     [name] told her to call back if her bp is high 181/76 in am 152/85

## 2017-01-24 NOTE — Telephone Encounter (Signed)
She is very sensitive to several medications.  She can try to increase her metoprolol to 50 mg in the morning.

## 2017-01-24 NOTE — Telephone Encounter (Signed)
Returned call to patient Provided info to patient per MD regarding med dose increase Patient states she failed to mention to [place], [place] that she is on new cream (made in [place]) Patient is unsure if her cream has anything in it that will make her BP go up States it has Doxapine (sp?) 5% - states she was advised to hold this by surgical center to see if this helps with BP She states she will hold it over the weekend, if not improvement will follow MD advice on increase B-blocker.

## 2017-01-27 ENCOUNTER — Telehealth: Payer: Self-pay | Admitting: Cardiology

## 2017-01-27 NOTE — Telephone Encounter (Signed)
New message    Pt is calling about BP medication. She states someone called her last week and told her to change the way she takes it, but she did not write it down. She is calling to ask about how she should take it. She states her Bp, the top number has been running high.   2/12-176/85 2/11-161/78 morning 153/76 night

## 2017-01-27 NOTE — Telephone Encounter (Signed)
Returned call to patient she stated she continues to have elevated B/P as listed below.Pulse ranging in 60's.After reviewing chart note from 01/23/17 Dr.Hochrein advised if still elevated increase metoprolol to 50 mg am 25 mg pm.Stated she has not used back cream for pain unable to tell me the name of cream made at a pharmacy in [place].Advised should be ok to use back cream as prescribed.Advised to monitor B/P and if continues to be elevated call back.

## 2017-01-29 ENCOUNTER — Telehealth: Payer: Self-pay | Admitting: Pulmonary Disease

## 2017-01-29 MED ORDER — PREDNISONE 10 MG PO TABS: ORAL_TABLET | ORAL | 0 refills | Status: DC

## 2017-01-29 NOTE — Telephone Encounter (Signed)
Not seen since 07/2015 so really needs ov but if declines > Prednisone 10 mg take  4 each am x 2 days,   2 each am x 2 days,  1 each am x 2 days and stop and set up f/u with NP to assess response to rx and set up chronic f/u

## 2017-01-29 NOTE — Telephone Encounter (Signed)
Spoke with the pt and notified of recs  She declined appt this wk but is set to see TP 2/27 pred sent and advised ED if symptoms persist or worsen

## 2017-01-29 NOTE — Telephone Encounter (Signed)
MW, no recs were documented on this encounter.  Please advise on recs.  Thanks!

## 2017-01-29 NOTE — Telephone Encounter (Signed)
MW  Please Advise-sick message  This is a RA pt. Pt. Called in today c/o of coughing but not much is coming up, wheezing, some chest tightness, chest congestion, increase sob Denies fever. She wanted to know if she could get her prednisone called in.  Allergies  Allergen Reactions  . Aspirin Other (See Comments)    REACTION: regular strength causes "heart to beat fast"  . Captopril Hypertension  . Naproxen Other (See Comments)    Tongue swelling  . Penicillins     Swelling around site  . Sulfonamide Derivatives Hives  . Clindamycin/Lincomycin

## 2017-01-30 ENCOUNTER — Other Ambulatory Visit: Payer: Self-pay | Admitting: Family Medicine

## 2017-01-31 ENCOUNTER — Telehealth: Payer: Self-pay | Admitting: Pulmonary Disease

## 2017-01-31 NOTE — Telephone Encounter (Signed)
Arina called back asking if pt was seen after 2016, advised her pt was not. She verbalized understanding and denied any further questions or concerns at this time.

## 2017-01-31 NOTE — Telephone Encounter (Signed)
ATC, NA and no ext given  I waited on hold for approx 5 min  We have not seen her since 2016  I printed her last ov note, which does indicate she is on pulmicort nebs bid, and faxed this to [place]  I wrote on the cover sheet that this was the last ov note and that she does have upcoming appt here on 02/11/17

## 2017-02-04 ENCOUNTER — Encounter: Payer: Self-pay | Admitting: Emergency Medicine

## 2017-02-04 ENCOUNTER — Telehealth: Payer: Self-pay | Admitting: Adult Health

## 2017-02-04 ENCOUNTER — Ambulatory Visit (INDEPENDENT_AMBULATORY_CARE_PROVIDER_SITE_OTHER): Payer: Medicare Other | Admitting: Emergency Medicine

## 2017-02-04 DIAGNOSIS — J383 Other diseases of vocal cords: Secondary | ICD-10-CM

## 2017-02-04 MED ORDER — BUDESONIDE 0.5 MG/2ML IN SUSP: 0.5000 mg | Freq: Two times a day (BID) | RESPIRATORY_TRACT | 6 refills | Status: DC

## 2017-02-04 MED ORDER — PREDNISONE 10 MG PO TABS: ORAL_TABLET | ORAL | 0 refills | Status: DC

## 2017-02-04 NOTE — Telephone Encounter (Signed)
Pt c/o wheezing, prod cough with yellow mucus, chills & sweats X 1w Pt was prescribed prednisone on 01-29-17, took last dose today with no improvement. Pt scheduled for acute visit with 02-04-17 @ 2:45p Nothing further needed.

## 2017-02-04 NOTE — Addendum Note (Signed)
Addended by: Len Blalock on: 02/04/2017 03:37 PM   Modules accepted: Orders

## 2017-02-04 NOTE — Patient Instructions (Addendum)
We will extend your prednisone taper  Please continue your singulair, fluticasone nasal spray, omeprazole / bicarbonate We will refill your budesonide, please use nebulized twice a day Continue your albuterol as needed Follow with APP in 7-10 days, to assess your progress

## 2017-02-04 NOTE — Addendum Note (Signed)
Addended by: Len Blalock on: 02/04/2017 03:38 PM   Modules accepted: Orders

## 2017-02-04 NOTE — Progress Notes (Signed)
Subjective:    Patient ID: [name], female    DOB: 07-20-1936, 81 y.o.   MRN: [id number]  HPI 41 -year-old former smoker (30 pack years) with a history of coronary disease, atrial fibrillation, fibromyalgia, hypertension. We have followed her for obstructive lung disease (mild by spirometry 07/30/10) without a bronchodilator response. Had a R VATS in the past for complicated effusion. Chronic cough in the setting of GERD, chronic rhinitis. She presents today reporting that for the last week she has been dealing with UA noise, stridor, a globus sensation. She is coughing most of the day, worst at night. Non-productive. She does not know of any precipitating event. She does note that her air purifier stopped working about 3 weeks ago. She was given a pred taper last week with temporary relief. Just finished it. She is on singulair, fluticasone nasal spray, generic zegerid qd. She is off budesonide nebs, is using albuterol prn.     Review of Systems  Past Medical History:  Diagnosis Date  . Anxiety   . Anxiety disorder   . Arthritis   . Asthmatic bronchitis   . Atrial fibrillation ([place])   . Bowel obstruction    blockage  . CAD (coronary artery disease)    Stent to RI 2003.  Myoview 2013 no ischemia.  . Cataract   . Chronic bronchitis ([place])   . Colon polyp    adenomatous  . Congestive heart disease ([facility])    Preserved EF  . Encephalitis    d/t meningitis  . Esophageal motility disorder   . Fibromyalgia   . GERD (gastroesophageal reflux disease)   . Hyperlipidemia   . Hypertension   . Hypothyroid   . 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 Nephrolithiasis   . OA (osteoarthritis)   . Pleural effusion on right   . PONV (postoperative nausea and vomiting)    history of cardiac arrest day 1 post surgery  in 2008  . Status post dilation of esophageal narrowing   . Vitamin D deficiency      Family History  Problem Relation Age of Onset  . Prostate  cancer Brother   . Cancer Brother     PROSTATE  . Heart disease Mother   . Asthma Mother   . Congestive Heart Failure Mother   . Emphysema Sister   . COPD Sister   . Stroke Sister   . Heart disease Sister   . Emphysema Brother   . Rheumatologic disease Neg Hx      Social History   Social History  . Marital status: Widowed    Spouse name: N/A  . Number of children: 2  . Years of education: N/A   Occupational History  . Retired    Social History Main Topics  . Smoking status: Former Smoker    Packs/day: 1.00    Years: 30.00    Types: Cigarettes    Quit date: 12/16/1989  . Smokeless tobacco: Never Used     Comment: smoked off & on  . Alcohol use No  . Drug use: No  . Sexual activity: No   Other Topics Concern  . Not on file   Social History Narrative   Originally from [place]. Always lived in [place]. No international travel. Has prior travel to [place], [place], [place], [place], [place], & [place]. No recent travel. She has a cat currently. No prior bird, mold, or hot tub exposure. 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 No known asbestos exposure. 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      Allergies  Allergen Reactions  . Aspirin Other (See Comments)    REACTION: regular strength causes "heart to beat fast"  . Captopril Hypertension  . Naproxen Other (See Comments)    Tongue swelling  . Penicillins     Swelling around site  . Sulfonamide Derivatives Hives  . Clindamycin/Lincomycin      Outpatient Medications Prior to Visit  Medication Sig Dispense Refill  . acetaminophen (TYLENOL) 500 MG tablet Take 500-1,000 mg by mouth every 8 (eight) hours as needed.    . ALLERGY RELIEF 10 MG tablet TAKE 1 TABLET ONCE A DAY 30 tablet 3  . ALPRAZolam (XANAX) 0.5 MG tablet TAKE 2 TABLETS AT BEDTIME 60 tablet 1  . Ascorbic Acid (VITAMIN C PO) Take 1 tablet by mouth every morning.    . benzonatate (TESSALON) 100 MG capsule  TAKE 1 CAPSULE TWICE DAILY AS NEEDED FOR COUGH 30 capsule 0  . budesonide (PULMICORT) 0.5 MG/2ML nebulizer solution Take 2 mLs (0.5 mg total) by nebulization 2 (two) times daily. Dx 496 120 mL 6  . Calcium Carbonate-Vitamin D (CALCIUM 600+D) 600-400 MG-UNIT per tablet Take 1 tablet by mouth daily at 6 PM.     . cholecalciferol (VITAMIN D) 1000 UNITS tablet Take 1,000 Units by mouth 2 (two) times daily with breakfast and lunch.     . Coenzyme Q10 (COQ10) 100 MG CAPS Take 100 mg by mouth daily.    Marland Kitchen denosumab (PROLIA) 60 MG/ML SOLN injection Inject 60 mg into the skin every 6 (six) months. Administer in upper arm, thigh, or abdomen 1 mL 0  . dextromethorphan-guaiFENesin (MUCINEX DM) 30-600 MG 12hr tablet Take 1 tablet by mouth 2 (two) times daily.    Marland Kitchen dicyclomine (BENTYL) 10 MG capsule TAKE (1) OR (2) CAPSULES EVERY SIX HOURS AS NEEDED FOR SPASM 60 capsule 0  . diltiazem (CARDIZEM CD) 120 MG 24 hr capsule TAKE (1) CAPSULE DAILY 30 capsule 10  . ESTRACE VAGINAL 0.1 MG/GM vaginal cream Apply twice a week    . fluticasone (FLONASE) 50 MCG/ACT nasal spray 2 SPRAYS IN EACH NOSTRIL ONCE A DAY 16 g 3  . HYDROcodone-acetaminophen (NORCO) 5-325 MG tablet Take 1 tablet by mouth every 6 (six) hours as needed for moderate pain. 30 tablet 0  . levothyroxine (SYNTHROID, LEVOTHROID) 25 MCG tablet TAKE 2 TABLETS DAILY AS DIRECTED 60 tablet 11  . meclizine (ANTIVERT) 25 MG tablet TAKE (1) TABLET THREE TIMES DAILY AS NEEDED FOR DIZZINESS. 30 tablet 0  . Melatonin 3 MG TABS Take 1-2 tablets (3-6 mg total) by mouth at bedtime as needed.  0  . metoprolol tartrate (LOPRESSOR) 25 MG tablet Take 1 tablet (25 mg total) by mouth 2 (two) times daily. 180 tablet 2  . montelukast (SINGULAIR) 10 MG tablet TAKE 1 TABLET ONCE A DAY 30 tablet 10  . Multiple Vitamin (MULTIVITAMIN WITH MINERALS) TABS Take 1 tablet by mouth daily.    . nitroGLYCERIN (NITROSTAT) 0.4 MG SL tablet 1 tablet under the tongue every 5 minutes as needed for  chest pain. 25 tablet 1  . omega-3 acid ethyl esters (LOVAZA) 1 g capsule TAKE (2) CAPSULES TWICE DAILY. 120 capsule 2  . omeprazole-sodium bicarbonate (ZEGERID) 40-1100 MG capsule TAKE (1) CAPSULE DAILY 30 capsule 3  . ondansetron (ZOFRAN) 4 MG tablet TAKE 1 TABLET EVERY 12 HOURS AS  NEEDED FOR NAUSEA 20 tablet 0  . predniSONE (DELTASONE) 10 MG tablet 4 x 2 days, 2 x 2 days, 1 x 2 days then stop 14 tablet 0  . PROAIR HFA 108 (90 Base) MCG/ACT inhaler USE 2 PUFFS EVERY 6 HOURS AS NEEDED FOR WHEEZING 8.5 g 0  . rosuvastatin (CRESTOR) 20 MG tablet TAKE 1 TABLET ONCE A DAY 30 tablet 2  . sertraline (ZOLOFT) 100 MG tablet TAKE 1 TABLET ONCE A DAY 30 tablet 1  . XARELTO 20 MG TABS tablet TAKE 1 TABLET ONCE A DAY 30 tablet 1   No facility-administered medications prior to visit.         Objective:   Physical Exam Vitals:   02/04/17 1445  BP: (!) 142/90  Pulse: 66  Temp: 98.2 F (36.8 C)  TempSrc: Oral  SpO2: 96%  Weight: 151 lb 3.2 oz (68.6 kg)  Height: 5\' 1"  (1.549 m)  Gen: Pleasant, well-nourished, in no distress,  normal affect  ENT: No lesions,  mouth clear,  oropharynx clear, no postnasal drip  Neck: No JVD, loud insp and exp stridor, more prominent on insp  Lungs: No use of accessory muscles, good air movement, no wheeze when distracted  Cardiovascular: RRR, heart sounds normal, no murmur or gallops, no peripheral edema  Musculoskeletal: No deformities, no cyanosis or clubbing  Neuro: alert, non focal  Skin: Warm, no lesions or rashes      Assessment & Plan:  Vocal cord dysfunction This is upper airway irritation syndrome. She can be distracted on examining wheezing/stridor goes away. Heard most on inspiration. Unclear to me what triggered the exacerbation. She is on her GERD and allergy therapy. She would like to restart her budesonide nebulizer twice a day. She is using albuterol as needed without much relief. I believe she will need an extended prednisone taper to get  this under control. We will continue her GERD and allergy regimens.  We will extend your prednisone taper  Please continue your singulair, fluticasone nasal spray, omeprazole / bicarbonate We will refill your budesonide, please use nebulized twice a day Continue your albuterol as needed Follow with APP in 7-10 days, to assess your progress   Baltazar Apo, MD, PhD 02/04/2017, 3:31 PM [facility] [phone] or if no answer [phone]

## 2017-02-04 NOTE — Assessment & Plan Note (Signed)
This is upper airway irritation syndrome. She can be distracted on examining wheezing/stridor goes away. Heard most on inspiration. Unclear to me what triggered the exacerbation. She is on her GERD and allergy therapy. She would like to restart her budesonide nebulizer twice a day. She is using albuterol as needed without much relief. I believe she will need an extended prednisone taper to get this under control. We will continue her GERD and allergy regimens.  We will extend your prednisone taper  Please continue your singulair, fluticasone nasal spray, omeprazole / bicarbonate We will refill your budesonide, please use nebulized twice a day Continue your albuterol as needed Follow with APP in 7-10 days, to assess your progress

## 2017-02-06 ENCOUNTER — Telehealth: Payer: Self-pay | Admitting: Pulmonary Disease

## 2017-02-06 NOTE — Telephone Encounter (Signed)
Placed forms in RA's lookat folder.  

## 2017-02-06 NOTE — Telephone Encounter (Signed)
This form is in Cherina's possession. Message will be routed to her for follow up.

## 2017-02-07 ENCOUNTER — Other Ambulatory Visit: Payer: Self-pay | Admitting: Cardiology

## 2017-02-07 ENCOUNTER — Other Ambulatory Visit: Payer: Self-pay | Admitting: Nurse Practitioner

## 2017-02-07 DIAGNOSIS — J449 Chronic obstructive pulmonary disease, unspecified: Secondary | ICD-10-CM | POA: Diagnosis not present

## 2017-02-07 NOTE — Telephone Encounter (Signed)
[name] calling back from [place] checking on status of rx - advised that forms received and waiting on RA to complete. She can be reached at [phone] -pr

## 2017-02-07 NOTE — Telephone Encounter (Signed)
Last filled 01/08/17, last seen 08/28/16. Call in [place] pt

## 2017-02-08 NOTE — Telephone Encounter (Signed)
Please call in xanax with 0 refills 

## 2017-02-10 NOTE — Telephone Encounter (Signed)
Paperwork was faxed on Friday.

## 2017-02-11 ENCOUNTER — Encounter: Payer: Self-pay | Admitting: Adult Health

## 2017-02-11 ENCOUNTER — Ambulatory Visit (INDEPENDENT_AMBULATORY_CARE_PROVIDER_SITE_OTHER): Payer: Medicare Other | Admitting: Adult Health

## 2017-02-11 ENCOUNTER — Ambulatory Visit (INDEPENDENT_AMBULATORY_CARE_PROVIDER_SITE_OTHER)
Admission: RE | Admit: 2017-02-11 | Discharge: 2017-02-11 | Disposition: A | Discharge status: Home / Self Care | Payer: Medicare Other | Source: Ambulatory Visit | Attending: Adult Health | Admitting: Adult Health

## 2017-02-11 VITALS — BP 128/76 | HR 63 | Ht 61.0 in | Wt 150.5 lb

## 2017-02-11 DIAGNOSIS — J383 Other diseases of vocal cords: Secondary | ICD-10-CM

## 2017-02-11 DIAGNOSIS — J209 Acute bronchitis, unspecified: Secondary | ICD-10-CM | POA: Diagnosis not present

## 2017-02-11 DIAGNOSIS — J4521 Mild intermittent asthma with (acute) exacerbation: Secondary | ICD-10-CM | POA: Diagnosis not present

## 2017-02-11 DIAGNOSIS — J40 Bronchitis, not specified as acute or chronic: Secondary | ICD-10-CM | POA: Diagnosis not present

## 2017-02-11 MED ORDER — AZITHROMYCIN 250 MG PO TABS: ORAL_TABLET | ORAL | 0 refills | Status: AC

## 2017-02-11 NOTE — Assessment & Plan Note (Signed)
Slow to resolve flare  Check cxr  zpack x 1   Plan  Patient Instructions  Chest xray today .  ZPack take as directed.  Finish prednisone taper .  Continue on Budesonide Neb Twice daily  .  Continue on Flonase , singulair , and omeprazole.  Follow up with Dr. Elsworth Soho  In 3 months and As needed   Please contact office for sooner follow up if symptoms do not improve or worsen or seek emergency care

## 2017-02-11 NOTE — Assessment & Plan Note (Signed)
imrproving , control for triggers.

## 2017-02-11 NOTE — Patient Instructions (Addendum)
Chest xray today .  ZPack take as directed.  Finish prednisone taper .  Continue on Budesonide Neb Twice daily  .  Continue on Flonase , singulair , and omeprazole.  Follow up with Dr. Elsworth Soho  In 3 months and As needed   Please contact office for sooner follow up if symptoms do not improve or worsen or seek emergency care

## 2017-02-11 NOTE — Progress Notes (Signed)
@Patient  ID: [name], female    DOB: Nov 09, 1936, 81 y.o.   MRN: [id number]  Chief Complaint  Patient presents with  . Follow-up    Asthma     Referring provider: Chipper Herb, MD  HPI: 81 year old female former smoker followed for asthma and chronic cough (VCD)  . Compensated by GERD and chronic rhinitis History of a complicated pleural effusion. Status post a right VATS Has A Fib on Xarelto   02/11/2017 Follow up : Asthma  Patient returns for a one-week follow-up. Patient was seen last visit for an asthma exacerbation. Felt to have some upper airway irritation and vocal cord dysfunction. She was treated with Singulair, Flonase, omeprazole. She was also extended on a prednisone taper. Pt says she was doing good until 1 month ago.  Patient returns today feeling better but still has cough and congestion . Coughing up clear to white mucus .  Still has some throat clearing  She has started back on  Pulmicort neb Twice daily  . Marland Kitchen     Allergies  Allergen Reactions  . Aspirin Other (See Comments)    REACTION: regular strength causes "heart to beat fast"  . Captopril Hypertension  . Naproxen Other (See Comments)    Tongue swelling  . Penicillins     Swelling around site  . Sulfonamide Derivatives Hives  . Clindamycin/Lincomycin     Immunization History  Administered Date(s) Administered  . Influenza Split 08/28/2012  . Influenza Whole 09/18/2009  . Influenza, High Dose Seasonal PF 09/23/2016  . Influenza,inj,Quad PF,36+ Mos 10/05/2013, 09/22/2015  . Influenza,inj,quad, With Preservative 09/12/2014  . Pneumococcal Conjugate-13 01/13/2014  . Pneumococcal Polysaccharide-23 08/28/2012  . Tdap 09/10/2011    Past Medical History:  Diagnosis Date  . Anxiety   . Anxiety disorder   . Arthritis   . Asthmatic bronchitis   . Atrial fibrillation ([place])   . Bowel obstruction    blockage  . CAD (coronary artery disease)    Stent to RI 2003.  Myoview 2013 no ischemia.  .  Cataract   . Chronic bronchitis ([place])   . Colon polyp    adenomatous  . Congestive heart disease ([facility])    Preserved EF  . Encephalitis    d/t meningitis  . Esophageal motility disorder   . Fibromyalgia   . GERD (gastroesophageal reflux disease)   . Hyperlipidemia   . Hypertension   . Hypothyroid   . 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 Nephrolithiasis   . OA (osteoarthritis)   . Pleural effusion on right   . PONV (postoperative nausea and vomiting)    history of cardiac arrest day 1 post surgery  in 2008  . Status post dilation of esophageal narrowing   . Vitamin D deficiency     Tobacco History: History  Smoking Status  . Former Smoker  . Packs/day: 1.00  . Years: 30.00  . Types: Cigarettes  . Quit date: 12/16/1989  Smokeless Tobacco  . Never Used    Comment: smoked off & on   Counseling given: Not Answered   Outpatient Encounter Prescriptions as of 02/11/2017  Medication Sig  . acetaminophen (TYLENOL) 500 MG tablet Take 500-1,000 mg by mouth every 8 (eight) hours as needed.  . ALLERGY RELIEF 10 MG tablet TAKE 1 TABLET ONCE A DAY  . ALPRAZolam (XANAX) 0.5 MG tablet TAKE 2 TABLETS AT BEDTIME  . Ascorbic Acid (VITAMIN C PO) Take 1 tablet by mouth every  morning.  . benzonatate (TESSALON) 100 MG capsule TAKE 1 CAPSULE TWICE DAILY AS NEEDED FOR COUGH  . budesonide (PULMICORT) 0.5 MG/2ML nebulizer solution Take 2 mLs (0.5 mg total) by nebulization 2 (two) times daily. Dx 496  . Calcium Carbonate-Vitamin D (CALCIUM 600+D) 600-400 MG-UNIT per tablet Take 1 tablet by mouth daily at 6 PM.   . cholecalciferol (VITAMIN D) 1000 UNITS tablet Take 1,000 Units by mouth 2 (two) times daily with breakfast and lunch.   . Coenzyme Q10 (COQ10) 100 MG CAPS Take 100 mg by mouth daily.  Marland Kitchen denosumab (PROLIA) 60 MG/ML SOLN injection Inject 60 mg into the skin every 6 (six) months. Administer in upper arm, thigh, or abdomen  . dextromethorphan-guaiFENesin (MUCINEX  DM) 30-600 MG 12hr tablet Take 1 tablet by mouth 2 (two) times daily.  Marland Kitchen dicyclomine (BENTYL) 10 MG capsule TAKE (1) OR (2) CAPSULES EVERY SIX HOURS AS NEEDED FOR SPASM  . diltiazem (CARDIZEM CD) 120 MG 24 hr capsule TAKE (1) CAPSULE DAILY  . ESTRACE VAGINAL 0.1 MG/GM vaginal cream Apply twice a week  . fluticasone (FLONASE) 50 MCG/ACT nasal spray 2 SPRAYS IN EACH NOSTRIL ONCE A DAY  . HYDROcodone-acetaminophen (NORCO) 5-325 MG tablet Take 1 tablet by mouth every 6 (six) hours as needed for moderate pain.  Marland Kitchen levothyroxine (SYNTHROID, LEVOTHROID) 25 MCG tablet TAKE 2 TABLETS DAILY AS DIRECTED  . meclizine (ANTIVERT) 25 MG tablet TAKE (1) TABLET THREE TIMES DAILY AS NEEDED FOR DIZZINESS.  . Melatonin 3 MG TABS Take 1-2 tablets (3-6 mg total) by mouth at bedtime as needed.  . metoprolol tartrate (LOPRESSOR) 25 MG tablet Take 1 tablet (25 mg total) by mouth 2 (two) times daily.  . montelukast (SINGULAIR) 10 MG tablet TAKE 1 TABLET ONCE A DAY  . Multiple Vitamin (MULTIVITAMIN WITH MINERALS) TABS Take 1 tablet by mouth daily.  . nitroGLYCERIN (NITROSTAT) 0.4 MG SL tablet 1 tablet under the tongue every 5 minutes as needed for chest pain.  Marland Kitchen omega-3 acid ethyl esters (LOVAZA) 1 g capsule TAKE (2) CAPSULES TWICE DAILY.  Marland Kitchen omeprazole-sodium bicarbonate (ZEGERID) 40-1100 MG capsule TAKE (1) CAPSULE DAILY  . ondansetron (ZOFRAN) 4 MG tablet TAKE 1 TABLET EVERY 12 HOURS AS NEEDED FOR NAUSEA  . predniSONE (DELTASONE) 10 MG tablet 40mg X3 days, 30mg  X3 days, 20mg  X3 days, 10mg X3 days, then stop.  Marland Kitchen PROAIR HFA 108 (90 Base) MCG/ACT inhaler USE 2 PUFFS EVERY 6 HOURS AS NEEDED FOR WHEEZING  . rosuvastatin (CRESTOR) 20 MG tablet TAKE 1 TABLET ONCE A DAY  . sertraline (ZOLOFT) 100 MG tablet TAKE 1 TABLET ONCE A DAY  . XARELTO 20 MG TABS tablet TAKE 1 TABLET ONCE A DAY  . azithromycin (ZITHROMAX Z-PAK) 250 MG tablet Take 2 tablets (500 mg) on  Day 1,  followed by 1 tablet (250 mg) once daily on Days 2 through 5.    No facility-administered encounter medications on file as of 02/11/2017.      Review of Systems  Constitutional:   No  weight loss, night sweats,  Fevers, chills, fatigue, or  lassitude.  HEENT:   No headaches,  Difficulty swallowing,  Tooth/dental problems, or  Sore throat,                No sneezing, itching, ear ache,  +nasal congestion, post nasal drip,   CV:  No chest pain,  Orthopnea, PND, swelling in lower extremities, anasarca, dizziness, palpitations, syncope.   GI  No heartburn, indigestion, abdominal pain, nausea, vomiting, diarrhea,  change in bowel habits, loss of appetite, bloody stools.   Resp:    No chest wall deformity  Skin: no rash or lesions.  GU: no dysuria, change in color of urine, no urgency or frequency.  No flank pain, no hematuria   MS:  No joint pain or swelling.  No decreased range of motion.  No back pain.    Physical Exam  BP 128/76 (BP Location: Left Arm, Cuff Size: Normal)   Pulse 63   Ht 5\' 1"  (1.549 m)   Wt 150 lb 8 oz (68.3 kg)   SpO2 97%   BMI 28.44 kg/m   GEN: A/Ox3; pleasant , NAD, elderly    HEENT:  [place]/AT,  EACs-clear, TMs-wnl, NOSE-clear drainage  THROAT-clear, no lesions, no postnasal drip or exudate noted.   NECK:  Supple w/ fair ROM; no JVD; normal carotid impulses w/o bruits; no thyromegaly or nodules palpated; no lymphadenopathy.    RESP  Clear  P & A; w/o, wheezes/ rales/ or rhonchi. no accessory muscle use, no dullness to percussion  CARD:  RRR, no m/r/g, no peripheral edema, pulses intact, no cyanosis or clubbing.  GI:   Soft & nt; nml bowel sounds; no organomegaly or masses detected.   Musco: Warm bil, no deformities or joint swelling noted.   Neuro: alert, no focal deficits noted.    Skin: Warm, no lesions or rashes   Lab Results: Imaging: No results found.   Assessment & Plan:   Asthma Slow to resolve flare  Check cxr  zpack x 1   Plan  Patient Instructions  Chest xray today .  ZPack take as  directed.  Finish prednisone taper .  Continue on Budesonide Neb Twice daily  .  Continue on Flonase , singulair , and omeprazole.  Follow up with Dr. Elsworth Soho  In 3 months and As needed   Please contact office for sooner follow up if symptoms do not improve or worsen or seek emergency care       Vocal cord dysfunction imrproving , control for triggers.      Rexene Edison, NP 02/11/2017

## 2017-02-17 DIAGNOSIS — M12571 Traumatic arthropathy, right ankle and foot: Secondary | ICD-10-CM | POA: Diagnosis not present

## 2017-02-17 DIAGNOSIS — M21171 Varus deformity, not elsewhere classified, right ankle: Secondary | ICD-10-CM | POA: Diagnosis not present

## 2017-02-17 NOTE — Progress Notes (Signed)
Reviewed & agree with plan  

## 2017-03-03 ENCOUNTER — Other Ambulatory Visit: Payer: Self-pay | Admitting: Cardiology

## 2017-03-04 ENCOUNTER — Telehealth: Payer: Self-pay

## 2017-03-04 NOTE — Telephone Encounter (Signed)
Patient would like a 90 day supply of Loratadine.  I do not see this medication on her med list.  Please advise.

## 2017-03-05 ENCOUNTER — Other Ambulatory Visit: Payer: Self-pay

## 2017-03-05 MED ORDER — MONTELUKAST SODIUM 10 MG PO TABS: 10.0000 mg | ORAL_TABLET | Freq: Every day | ORAL | 3 refills | Status: DC

## 2017-03-05 MED ORDER — RIVAROXABAN 20 MG PO TABS: 20.0000 mg | ORAL_TABLET | Freq: Every day | ORAL | 1 refills | Status: DC

## 2017-03-05 MED ORDER — OMEGA-3-ACID ETHYL ESTERS 1 G PO CAPS: ORAL_CAPSULE | ORAL | 1 refills | Status: DC

## 2017-03-05 MED ORDER — ROSUVASTATIN CALCIUM 20 MG PO TABS: 20.0000 mg | ORAL_TABLET | Freq: Every day | ORAL | 1 refills | Status: DC

## 2017-03-05 MED ORDER — DILTIAZEM HCL ER COATED BEADS 120 MG PO CP24: ORAL_CAPSULE | ORAL | 2 refills | Status: DC

## 2017-03-05 MED ORDER — SERTRALINE HCL 100 MG PO TABS: 100.0000 mg | ORAL_TABLET | Freq: Every day | ORAL | 1 refills | Status: DC

## 2017-03-05 MED ORDER — LORATADINE 10 MG PO TABS: 10.0000 mg | ORAL_TABLET | Freq: Every day | ORAL | 3 refills | Status: DC

## 2017-03-05 MED ORDER — LEVOTHYROXINE SODIUM 25 MCG PO TABS: ORAL_TABLET | ORAL | 3 refills | Status: DC

## 2017-03-05 NOTE — Telephone Encounter (Signed)
Sent to pharm ....

## 2017-03-07 ENCOUNTER — Other Ambulatory Visit: Payer: Self-pay | Admitting: Nurse Practitioner

## 2017-03-07 DIAGNOSIS — M5416 Radiculopathy, lumbar region: Secondary | ICD-10-CM | POA: Diagnosis not present

## 2017-03-07 DIAGNOSIS — G629 Polyneuropathy, unspecified: Secondary | ICD-10-CM | POA: Diagnosis not present

## 2017-03-07 DIAGNOSIS — G894 Chronic pain syndrome: Secondary | ICD-10-CM | POA: Diagnosis not present

## 2017-03-07 MED ORDER — DENOSUMAB 60 MG/ML ~~LOC~~ SOLN: 60.0000 mg | SUBCUTANEOUS | 0 refills | Status: DC

## 2017-03-10 NOTE — Telephone Encounter (Signed)
Last filled 02/08/17, has appt tomorrow

## 2017-03-11 ENCOUNTER — Ambulatory Visit (INDEPENDENT_AMBULATORY_CARE_PROVIDER_SITE_OTHER): Payer: Medicare Other | Admitting: Family Medicine

## 2017-03-11 ENCOUNTER — Encounter: Payer: Self-pay | Admitting: Family Medicine

## 2017-03-11 VITALS — BP 130/64 | HR 60 | Temp 96.8°F | Ht 61.0 in | Wt 156.0 lb

## 2017-03-11 DIAGNOSIS — E559 Vitamin D deficiency, unspecified: Secondary | ICD-10-CM | POA: Diagnosis not present

## 2017-03-11 DIAGNOSIS — M797 Fibromyalgia: Secondary | ICD-10-CM

## 2017-03-11 DIAGNOSIS — I480 Paroxysmal atrial fibrillation: Secondary | ICD-10-CM

## 2017-03-11 DIAGNOSIS — I10 Essential (primary) hypertension: Secondary | ICD-10-CM | POA: Diagnosis not present

## 2017-03-11 DIAGNOSIS — E7800 Pure hypercholesterolemia, unspecified: Secondary | ICD-10-CM | POA: Diagnosis not present

## 2017-03-11 DIAGNOSIS — K219 Gastro-esophageal reflux disease without esophagitis: Secondary | ICD-10-CM

## 2017-03-11 NOTE — Progress Notes (Signed)
Subjective:    Patient ID: [name], female    DOB: 08-30-1936, 81 y.o.   MRN: [id number]  HPI Pt here for follow up and management of chronic medical problems which includes hyperlipidemia and hypertension. She is taking medication regularly.The patient today does complain of some right foot pain. This is what she's had all the problems with and she has been seeing an extremity specialist with orthopedics. She also sees the back pain specialist with [facility] orthopedics and sports medicine, Dr.Ramos. The patient is seen Dr. mother showed for her foot callus and foot pain on the right foot. He is recommended a brace to put her shoe and this has not helped her so far. She is seeing the back specialists and has a pain stimulator that she is currently wearing externally and may end up having this implanted if it begins to help her back pain. She denies any chest pain or shortness of breath. The last time she saw the cardiologist which was several months ago he did change her metoprolol and her blood pressure has been doing better with taking 2 in the morning and one in the evening. She denies any chest pain or shortness of breath anymore than usual she's not having any trouble with the stomach other than some more frequent bowel movements in the morning. She is not seeing any blood in the stool or had any black tarry bowel movements. She is passing her water without problems.    Patient Active Problem List   Diagnosis Date Noted  . Vocal cord dysfunction 02/04/2017  . Irritable bowel syndrome with diarrhea 12/11/2015  . UTI (urinary tract infection) 08/22/2015  . Cough 07/26/2015  . Diarrhea 11/30/2014  . Heme + stool 11/30/2014  . Chronic anticoagulation 11/30/2014  . CHF (congestive heart failure) ([place]) 04/30/2014  . 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 Osteoporosis 01/27/2014  . Fibromyalgia 08/09/2013  . Asthma 08/01/2010  . Hypothyroidism 03/20/2010  . Depression  03/20/2010  . HTN (hypertension) 03/20/2010  . ESOPHAGEAL MOTILITY DISORDER 03/20/2010  . Hyperlipidemia 04/22/2009  . Cor athrscl-uns vessel 04/22/2009  . ATRIAL FIBRILLATION, PAROXYSMAL 04/22/2009  . ESOPHAGEAL STRICTURE 10/31/2004  . GERD 10/31/2004  . HIATAL HERNIA 10/06/2001   Outpatient Encounter Prescriptions as of 03/11/2017  Medication Sig  . acetaminophen (TYLENOL) 500 MG tablet Take 500-1,000 mg by mouth every 8 (eight) hours as needed.  . ALPRAZolam (XANAX) 0.5 MG tablet TAKE 2 TABLETS AT BEDTIME  . Ascorbic Acid (VITAMIN C PO) Take 1 tablet by mouth every morning.  . budesonide (PULMICORT) 0.5 MG/2ML nebulizer solution Take 2 mLs (0.5 mg total) by nebulization 2 (two) times daily. Dx 496  . Calcium Carbonate-Vitamin D (CALCIUM 600+D) 600-400 MG-UNIT per tablet Take 1 tablet by mouth daily at 6 PM.   . cholecalciferol (VITAMIN D) 1000 UNITS tablet Take 1,000 Units by mouth 2 (two) times daily with breakfast and lunch.   . Coenzyme Q10 (COQ10) 100 MG CAPS Take 100 mg by mouth daily.  Marland Kitchen denosumab (PROLIA) 60 MG/ML SOLN injection Inject 60 mg into the skin every 6 (six) months. Administer in upper arm, thigh, or abdomen  . dicyclomine (BENTYL) 10 MG capsule TAKE (1) OR (2) CAPSULES EVERY SIX HOURS AS NEEDED FOR SPASM  . diltiazem (CARDIZEM CD) 120 MG 24 hr capsule TAKE (1) CAPSULE DAILY  . ESTRACE VAGINAL 0.1 MG/GM vaginal cream Apply twice a week  . fluticasone (FLONASE) 50 MCG/ACT nasal spray 2 SPRAYS IN EACH NOSTRIL ONCE A  DAY  . HYDROcodone-acetaminophen (NORCO) 5-325 MG tablet Take 1 tablet by mouth every 6 (six) hours as needed for moderate pain.  Marland Kitchen levothyroxine (SYNTHROID, LEVOTHROID) 25 MCG tablet TAKE 2 TABLETS DAILY AS DIRECTED  . loratadine (CLARITIN) 10 MG tablet Take 1 tablet (10 mg total) by mouth daily.  . meclizine (ANTIVERT) 25 MG tablet TAKE (1) TABLET THREE TIMES DAILY AS NEEDED FOR DIZZINESS.  . Melatonin 3 MG TABS Take 1-2 tablets (3-6 mg total) by mouth at  bedtime as needed.  . metoprolol tartrate (LOPRESSOR) 25 MG tablet Take 1 tablet (25 mg total) by mouth 2 (two) times daily. (Patient taking differently: Take 25 mg by mouth 3 (three) times daily. )  . montelukast (SINGULAIR) 10 MG tablet Take 1 tablet (10 mg total) by mouth daily.  . Multiple Vitamin (MULTIVITAMIN WITH MINERALS) TABS Take 1 tablet by mouth daily.  Marland Kitchen omega-3 acid ethyl esters (LOVAZA) 1 g capsule TAKE (2) CAPSULES TWICE DAILY.  Marland Kitchen omeprazole-sodium bicarbonate (ZEGERID) 40-1100 MG capsule TAKE (1) CAPSULE DAILY  . PROAIR HFA 108 (90 Base) MCG/ACT inhaler USE 2 PUFFS EVERY 6 HOURS AS NEEDED FOR WHEEZING  . rivaroxaban (XARELTO) 20 MG TABS tablet Take 1 tablet (20 mg total) by mouth daily.  . rosuvastatin (CRESTOR) 20 MG tablet Take 1 tablet (20 mg total) by mouth daily.  . sertraline (ZOLOFT) 100 MG tablet Take 1 tablet (100 mg total) by mouth daily.  . [DISCONTINUED] benzonatate (TESSALON) 100 MG capsule TAKE 1 CAPSULE TWICE DAILY AS NEEDED FOR COUGH  . [DISCONTINUED] dextromethorphan-guaiFENesin (MUCINEX DM) 30-600 MG 12hr tablet Take 1 tablet by mouth 2 (two) times daily.  . [DISCONTINUED] metoprolol tartrate (LOPRESSOR) 25 MG tablet Take 1 tablet (25 mg total) by mouth 2 (two) times daily.  . [DISCONTINUED] ondansetron (ZOFRAN) 4 MG tablet TAKE 1 TABLET EVERY 12 HOURS AS NEEDED FOR NAUSEA  . [DISCONTINUED] predniSONE (DELTASONE) 10 MG tablet 50mX3 days, 349mX3 days, 2044m3 days, 75m66mdays, then stop.  . nitroGLYCERIN (NITROSTAT) 0.4 MG SL tablet 1 tablet under the tongue every 5 minutes as needed for chest pain. (Patient not taking: Reported on 03/11/2017)   No facility-administered encounter medications on file as of 03/11/2017.       Review of Systems  Constitutional: Negative.   HENT: Negative.   Eyes: Negative.   Respiratory: Negative.   Cardiovascular: Negative.   Gastrointestinal: Negative.   Endocrine: Negative.   Genitourinary: Negative.   Musculoskeletal:  Positive for arthralgias (right foot pain) and back pain (following Dr RamoNelva BushSkin: Negative.   Allergic/Immunologic: Negative.   Neurological: Negative.   Hematological: Negative.   Psychiatric/Behavioral: Negative.        Objective:   Physical Exam  Constitutional: She is oriented to person, place, and time. She appears well-developed and well-nourished. No distress.  Patient is pleasant and alert despite all the problems with her joints including back pain and foot pain.  HENT:  Head: Normocephalic and atraumatic.  Right Ear: External ear normal.  Left Ear: External ear normal.  Nose: Nose normal.  Mouth/Throat: Oropharynx is clear and moist.  Eyes: Conjunctivae and EOM are normal. Pupils are equal, round, and reactive to light. Right eye exhibits no discharge. Left eye exhibits no discharge. No scleral icterus.  Neck: Normal range of motion. Neck supple. No thyromegaly present.  Bruits or thyromegaly are not present  Cardiovascular: Normal rate, regular rhythm and normal heart sounds.   No murmur heard. The heart has a regular rate and  rhythm at 60/m  Pulmonary/Chest: Effort normal and breath sounds normal. No respiratory distress. She has no wheezes. She has no rales.  Clear anteriorly and posteriorly  Abdominal: Soft. Bowel sounds are normal. She exhibits no mass. There is no tenderness. There is no rebound and no guarding.  The abdomen is nontender to palpation and there is no liver or spleen enlargement and no bruits  Musculoskeletal: She exhibits deformity. She exhibits no edema.  The patient uses a cane for ambulation. She has a large callus on the right lateral foot that is wearing the inside of her shoe.  Lymphadenopathy:    She has no cervical adenopathy.  Neurological: She is alert and oriented to person, place, and time. She has normal reflexes. No cranial nerve deficit.  Skin: Skin is warm and dry. No rash noted.  Psychiatric: She has a normal mood and affect. Her  behavior is normal. Judgment and thought content normal.  Nursing note and vitals reviewed.   BP 130/64 (BP Location: Left Arm)   Pulse 60   Temp (!) 96.8 F (36 C) (Oral)   Ht '5\' 1"'  (1.549 m)   Wt 156 lb (70.8 kg)   BMI 29.48 kg/m        Assessment & Plan:  1. Pure hypercholesterolemia -Isac Caddy current treatment pending results of lab work - CBC with Differential/Platelet - Lipid panel  2. Essential hypertension -The blood pressure is good today and she should continue with current treatment - CBC with Differential/Platelet - BMP8+EGFR - Hepatic function panel  3. Paroxysmal atrial fibrillation (HCC) -The heart has a regular rate and rhythm at 60/m today. Continue to follow-up with cardiology - CBC with Differential/Platelet  4. Vitamin D deficiency -Continue current treatment pending results of lab work - CBC with Differential/Platelet - VITAMIN D 25 Hydroxy (Vit-D Deficiency, Fractures)  5. Gastroesophageal reflux disease, esophagitis presence not specified -Patient had no complaints with reflux today and she will continue to watch her diet and take ranitidine if needed. - CBC with Differential/Platelet  6. Fibromyalgia -Continue current treatment - CBC with Differential/Platelet  Patient Instructions                       Medicare Annual Wellness Visit  [facility] and the medical providers at [facility] strive to bring you the best medical care.  In doing so we not only want to address your current medical conditions and concerns but also to detect new conditions early and prevent illness, disease and health-related problems.    Medicare offers a yearly Wellness Visit which allows our clinical staff to assess your need for preventative services including immunizations, lifestyle education, counseling to decrease risk of preventable diseases and screening for fall risk and other medical concerns.    This visit is provided free of charge  (no copay) for all Medicare recipients. The clinical pharmacists at [facility] have begun to conduct these Wellness Visits which will also include a thorough review of all your medications.    As you primary medical provider recommend that you make an appointment for your Annual Wellness Visit if you have not done so already this year.  You may set up this appointment before you leave today or you may call back ([phone]) and schedule an appointment.  Please make sure when you call that you mention that you are scheduling your Annual Wellness Visit with the clinical pharmacist so that the appointment may be made for the  proper length of time.     Continue current medications. Continue good therapeutic lifestyle changes which include good diet and exercise. Fall precautions discussed with patient. If an FOBT was given today- please return it to our front desk. If you are over 7 years old - you may need Prevnar 51 or the adult Pneumonia vaccine.  **Flu shots are available--- please call and schedule a FLU-CLINIC appointment**  After your visit with [place] today you will receive a survey in the mail or online from [employer] regarding your care with [place]. Please take a moment to fill this out. Your feedback is very important to [place] as you can help [place] better understand your patient needs as well as improve your experience and satisfaction. WE CARE ABOUT YOU!!!   Follow-up with both orthopedic specialist as planned for your back and your foot Follow-up with cardiology as planned Use your cane regularly Make every effort not to fall   Arrie Senate MD

## 2017-03-11 NOTE — Patient Instructions (Addendum)
Medicare Annual Wellness Visit  [facility] and the medical providers at [facility] strive to bring you the best medical care.  In doing so we not only want to address your current medical conditions and concerns but also to detect new conditions early and prevent illness, disease and health-related problems.    Medicare offers a yearly Wellness Visit which allows our clinical staff to assess your need for preventative services including immunizations, lifestyle education, counseling to decrease risk of preventable diseases and screening for fall risk and other medical concerns.    This visit is provided free of charge (no copay) for all Medicare recipients. The clinical pharmacists at [facility] have begun to conduct these Wellness Visits which will also include a thorough review of all your medications.    As you primary medical provider recommend that you make an appointment for your Annual Wellness Visit if you have not done so already this year.  You may set up this appointment before you leave today or you may call back ([phone]) and schedule an appointment.  Please make sure when you call that you mention that you are scheduling your Annual Wellness Visit with the clinical pharmacist so that the appointment may be made for the proper length of time.     Continue current medications. Continue good therapeutic lifestyle changes which include good diet and exercise. Fall precautions discussed with patient. If an FOBT was given today- please return it to our front desk. If you are over 79 years old - you may need Prevnar 40 or the adult Pneumonia vaccine.  **Flu shots are available--- please call and schedule a FLU-CLINIC appointment**  After your visit with [place] today you will receive a survey in the mail or online from [employer] regarding your care with [place]. Please take a moment to fill this out. Your feedback is very  important to [place] as you can help [place] better understand your patient needs as well as improve your experience and satisfaction. WE CARE ABOUT YOU!!!   Follow-up with both orthopedic specialist as planned for your back and your foot Follow-up with cardiology as planned Use your cane regularly Make every effort not to fall

## 2017-03-12 LAB — CBC WITH DIFFERENTIAL/PLATELET
Basophils Absolute: 0.1 10*3/uL (ref 0.0–0.2)
Basos: 1 %
EOS (ABSOLUTE): 0.2 10*3/uL (ref 0.0–0.4)
Eos: 2 %
Hematocrit: 37.9 % (ref 34.0–46.6)
Hemoglobin: 12.6 g/dL (ref 11.1–15.9)
Immature Grans (Abs): 0 10*3/uL (ref 0.0–0.1)
Immature Granulocytes: 0 %
Lymphocytes Absolute: 4.4 10*3/uL — ABNORMAL HIGH (ref 0.7–3.1)
Lymphs: 53 %
MCH: 31 pg (ref 26.6–33.0)
MCHC: 33.2 g/dL (ref 31.5–35.7)
MCV: 93 fL (ref 79–97)
Monocytes Absolute: 0.9 10*3/uL (ref 0.1–0.9)
Monocytes: 11 %
Neutrophils Absolute: 2.7 10*3/uL (ref 1.4–7.0)
Neutrophils: 33 %
Platelets: 297 10*3/uL (ref 150–379)
RBC: 4.06 x10E6/uL (ref 3.77–5.28)
RDW: 13.6 % (ref 12.3–15.4)
WBC: 8.1 10*3/uL (ref 3.4–10.8)

## 2017-03-12 LAB — BMP8+EGFR
BUN/Creatinine Ratio: 16 (ref 12–28)
BUN: 11 mg/dL (ref 8–27)
CO2: 27 mmol/L (ref 18–29)
Calcium: 9.4 mg/dL (ref 8.7–10.3)
Chloride: 97 mmol/L (ref 96–106)
Creatinine, Ser: 0.69 mg/dL (ref 0.57–1.00)
GFR calc Af Amer: 96 mL/min/{1.73_m2} (ref >59)
GFR calc non Af Amer: 84 mL/min/{1.73_m2} (ref >59)
Glucose: 83 mg/dL (ref 65–99)
Potassium: 4.9 mmol/L (ref 3.5–5.2)
Sodium: 139 mmol/L (ref 134–144)

## 2017-03-12 LAB — LIPID PANEL
Chol/HDL Ratio: 2.4 ratio units (ref 0.0–4.4)
Cholesterol, Total: 128 mg/dL (ref 100–199)
HDL: 54 mg/dL (ref >39)
LDL Calculated: 46 mg/dL (ref 0–99)
Triglycerides: 140 mg/dL (ref 0–149)
VLDL Cholesterol Cal: 28 mg/dL (ref 5–40)

## 2017-03-12 LAB — HEPATIC FUNCTION PANEL
ALT: 20 IU/L (ref 0–32)
AST: 29 IU/L (ref 0–40)
Albumin: 4.3 g/dL (ref 3.5–4.8)
Alkaline Phosphatase: 78 IU/L (ref 39–117)
Bilirubin Total: 0.3 mg/dL (ref 0.0–1.2)
Bilirubin, Direct: 0.11 mg/dL (ref 0.00–0.40)
Total Protein: 7.2 g/dL (ref 6.0–8.5)

## 2017-03-12 LAB — VITAMIN D 25 HYDROXY (VIT D DEFICIENCY, FRACTURES): Vit D, 25-Hydroxy: 49.4 ng/mL (ref 30.0–100.0)

## 2017-03-13 ENCOUNTER — Other Ambulatory Visit: Payer: Self-pay | Admitting: Family Medicine

## 2017-03-17 DIAGNOSIS — Z01812 Encounter for preprocedural laboratory examination: Secondary | ICD-10-CM | POA: Diagnosis not present

## 2017-03-18 ENCOUNTER — Telehealth: Payer: Self-pay | Admitting: Family Medicine

## 2017-03-18 NOTE — Telephone Encounter (Signed)
patient called and made appt for Prolia injection

## 2017-03-24 ENCOUNTER — Encounter: Payer: Self-pay | Admitting: Family Medicine

## 2017-03-24 DIAGNOSIS — G8929 Other chronic pain: Secondary | ICD-10-CM | POA: Diagnosis not present

## 2017-03-24 DIAGNOSIS — M5442 Lumbago with sciatica, left side: Secondary | ICD-10-CM | POA: Diagnosis not present

## 2017-03-31 DIAGNOSIS — M47816 Spondylosis without myelopathy or radiculopathy, lumbar region: Secondary | ICD-10-CM | POA: Diagnosis not present

## 2017-03-31 DIAGNOSIS — M5416 Radiculopathy, lumbar region: Secondary | ICD-10-CM | POA: Diagnosis not present

## 2017-03-31 DIAGNOSIS — M5442 Lumbago with sciatica, left side: Secondary | ICD-10-CM | POA: Diagnosis not present

## 2017-03-31 DIAGNOSIS — G8929 Other chronic pain: Secondary | ICD-10-CM | POA: Diagnosis not present

## 2017-04-03 ENCOUNTER — Ambulatory Visit: Payer: Self-pay | Admitting: Pharmacist

## 2017-04-04 ENCOUNTER — Encounter: Payer: Self-pay | Admitting: Family Medicine

## 2017-04-08 ENCOUNTER — Other Ambulatory Visit: Payer: Self-pay | Admitting: Family Medicine

## 2017-04-08 ENCOUNTER — Ambulatory Visit: Payer: Self-pay | Admitting: Pharmacist

## 2017-04-09 ENCOUNTER — Telehealth: Payer: Self-pay | Admitting: Pulmonary Disease

## 2017-04-09 MED ORDER — PREDNISONE 10 MG PO TABS
ORAL_TABLET | ORAL | 0 refills | Status: DC
Start: 2017-04-09 — End: 2017-04-22

## 2017-04-09 MED ORDER — AZITHROMYCIN 250 MG PO TABS
ORAL_TABLET | ORAL | 0 refills | Status: AC
Start: 2017-04-09 — End: 2017-04-14

## 2017-04-09 NOTE — Telephone Encounter (Signed)
Spoke with pt, who reports of prod cough with yellow to greenish phlegm, wheezing, increased sob & chest tightness x3d Pt taken albuterol inhaler 3-4x daily with mild relief.  Pt is requesting Rx of prednisone & possibly abx Denies any fever, chills or sweats.  RA please advise. Thanks.

## 2017-04-09 NOTE — Telephone Encounter (Signed)
Pt aware of RA recommendations. Rx sent to preferred pharmacy. Pt aware and voiced her understanding. Nothing further needed.

## 2017-04-09 NOTE — Telephone Encounter (Signed)
Prednisone 10 mg tabs  Take 2 tabs daily with food x 5ds, then 1 tab daily with food x 5ds then STOP zpak

## 2017-04-10 ENCOUNTER — Encounter: Payer: Self-pay | Admitting: Family Medicine

## 2017-04-21 DIAGNOSIS — G950 Syringomyelia and syringobulbia: Secondary | ICD-10-CM | POA: Diagnosis not present

## 2017-04-22 ENCOUNTER — Ambulatory Visit (INDEPENDENT_AMBULATORY_CARE_PROVIDER_SITE_OTHER): Payer: Medicare Other | Admitting: Pharmacist

## 2017-04-22 DIAGNOSIS — M810 Age-related osteoporosis without current pathological fracture: Secondary | ICD-10-CM | POA: Diagnosis not present

## 2017-04-22 MED ORDER — DENOSUMAB 60 MG/ML ~~LOC~~ SOLN
60.0000 mg | Freq: Once | SUBCUTANEOUS | Status: AC
  Administered 2017-04-22: 60 mg via SUBCUTANEOUS

## 2017-04-22 NOTE — Progress Notes (Signed)
Patient ID: [name], female   DOB: 12/26/1935, 81 y.o.   MRN: [id number]    HPI: [name] has a history of osteoporosis.  She is currently receiving Prolia injections every 6 months.  She also has taken Forteo in the past from 07/2011 to 07/2013  Back Pain?  Yes   - recent MRI showed cyst along her spinal chord.  Per patient neurosurgeon felt surgery was not recommended at this time. They are considering other options and she has appt with [name] to discuss options in 1-2 weeks Kyphosis?  No Prior fracture?  Yes - vertebral fracture about 2 years ago.                                                             PMH: Age at menopause:  31's Hysterectomy?  Yes Oophorectomy?  No - both ovaries remain HRT? Yes - Former.  Type/duration: starogen Steroid Use?  No - has dose pack recently but no regular use Thyroid med?  No History of cancer?  No History of digestive disorders (ie Crohn's)?  Yes - PPI therapy Current or previous eating disorders?  No Last Vitamin D Result:  49.4 (03/11/2017) Last GFR Result:  84 (03/11/2017)   FH/SH: Family history of osteoporosis?  No Parent with history of hip fracture?  No Family history of breast cancer?  No Exercise?  No Smoking?  No Alcohol?  No    Calcium Assessment Calcium Intake  # of servings/day  Calcium mg  Milk (8 oz) 1.5  x  300  = 450mg   Yogurt (4 oz) 0 x  200 = 0  Cheese (1 oz) 0 x  200 = 0  Other Calcium sources   250mg   Ca supplement 600mg  qd = 600mg    Estimated calcium intake per day 1250mg    DEXA Results Date of Test T-Score for AP Spine L1-L4 T-Score for Neck of Left Hip  10/03/2016 -0.8 -1.8  07/27/2014 -1.5 -1.7  07/15/2012 -1.8` -1.7  05/29/2011 -2.2 -1.9  02/16/2007 -2.1 -1.7   Lowest T-Score at L-1 was -3.2 *(05/29/2011)  Assessment: Osteoporosis with improved BMD.  Recommendations: 1. Prolia 60mg  administered in office today - SQ into abdomen 2.  continue calcium 1200mg  daily through supplementation  or diet.  3.  Discussed many options to increase activity today.  Water aerobics or yoga.  Patient will discuss with ortho.  Gave schedule for both [facility] and [employer] classes and schedule for Yoga class in [place]. 4.  Counseled and educated about fall risk and prevention.  Recheck DEXA:  09/2018  Time spent counseling patient:  40 minutes

## 2017-04-23 DIAGNOSIS — L98491 Non-pressure chronic ulcer of skin of other sites limited to breakdown of skin: Secondary | ICD-10-CM | POA: Diagnosis not present

## 2017-04-29 DIAGNOSIS — M7741 Metatarsalgia, right foot: Secondary | ICD-10-CM | POA: Diagnosis not present

## 2017-04-29 DIAGNOSIS — M21171 Varus deformity, not elsewhere classified, right ankle: Secondary | ICD-10-CM | POA: Diagnosis not present

## 2017-04-29 DIAGNOSIS — M12571 Traumatic arthropathy, right ankle and foot: Secondary | ICD-10-CM | POA: Diagnosis not present

## 2017-05-06 ENCOUNTER — Other Ambulatory Visit: Payer: Self-pay | Admitting: Internal Medicine

## 2017-05-06 ENCOUNTER — Other Ambulatory Visit: Payer: Self-pay | Admitting: Family Medicine

## 2017-05-20 ENCOUNTER — Ambulatory Visit: Payer: Medicare Other | Admitting: Pulmonary Disease

## 2017-05-20 ENCOUNTER — Other Ambulatory Visit: Payer: Self-pay | Admitting: Cardiology

## 2017-05-21 ENCOUNTER — Telehealth: Payer: Self-pay | Admitting: *Deleted

## 2017-05-21 MED ORDER — METOPROLOL TARTRATE 25 MG PO TABS: 25.0000 mg | ORAL_TABLET | Freq: Two times a day (BID) | ORAL | 2 refills | Status: DC

## 2017-05-21 NOTE — Telephone Encounter (Signed)
Per last Tele note 01-27-2017: increase metoprolol to 50 mg am 25 mg pm  Refill #[id number] sent as requested

## 2017-05-21 NOTE — Telephone Encounter (Signed)
Pharmacist from [facility] called in regards to the refusal that they received yesterday for the patients metoprolol. He stated that per the patient she is now taking three tabs qd . If appropriate, please send in an updated rx. Thanks, MI

## 2017-06-03 ENCOUNTER — Other Ambulatory Visit: Payer: Self-pay | Admitting: Family Medicine

## 2017-06-03 ENCOUNTER — Other Ambulatory Visit: Payer: Self-pay | Admitting: Cardiology

## 2017-06-06 ENCOUNTER — Telehealth: Payer: Self-pay

## 2017-06-06 DIAGNOSIS — G8929 Other chronic pain: Secondary | ICD-10-CM | POA: Diagnosis not present

## 2017-06-06 DIAGNOSIS — M5442 Lumbago with sciatica, left side: Secondary | ICD-10-CM | POA: Diagnosis not present

## 2017-06-06 DIAGNOSIS — M47816 Spondylosis without myelopathy or radiculopathy, lumbar region: Secondary | ICD-10-CM | POA: Diagnosis not present

## 2017-06-06 DIAGNOSIS — M5136 Other intervertebral disc degeneration, lumbar region: Secondary | ICD-10-CM | POA: Diagnosis not present

## 2017-06-06 NOTE — Telephone Encounter (Signed)
   [facility] Pre-operative Risk Assessment    Request for surgical clearance:  1. What type of surgery is being performed? Spinal cord stimulator   2. When is this surgery scheduled? TBD   3. Are there any medications that need to be held prior to surgery and how long? TBD   4. Name of physician performing surgery? [facility] fax clearance/recommendations to [employer]   5. What is your office phone and fax number? [phone] fax [phone]

## 2017-06-10 ENCOUNTER — Ambulatory Visit: Payer: Self-pay | Admitting: Physician Assistant

## 2017-06-13 ENCOUNTER — Telehealth: Payer: Self-pay | Admitting: Family Medicine

## 2017-06-13 NOTE — Telephone Encounter (Signed)
Pt notified to have Dr Valla Leaver office fax form to [facility]

## 2017-06-15 NOTE — Telephone Encounter (Signed)
The patient can hold her Xarelto as needed for the surgical procedure without bridging.  She had no recent symptoms. Therefore, based on ACC/AHA guidelines, the patient would be at acceptable risk for the planned procedure without further cardiovascular testing.

## 2017-06-16 NOTE — Telephone Encounter (Signed)
Will forward this note to number provided 

## 2017-06-17 ENCOUNTER — Telehealth: Payer: Self-pay | Admitting: Family Medicine

## 2017-06-17 NOTE — Telephone Encounter (Signed)
I left a message with Judeen Hammans at [facility] ortho - to have another form faxed to me directly.

## 2017-06-25 NOTE — Pre-Procedure Instructions (Signed)
[name]  06/25/2017      [address], [address] [place] [address] Phone: [phone] Fax: [phone]    Your procedure is scheduled on July 18  Report to [facility] at [facility] A.M.  Call this number if you have problems the morning of surgery:  [phone]   Remember:  Do not eat food or drink liquids after midnight.   Take these medicines the morning of surgery with A SIP OF WATER acetaminophen (TYLENOL) if needed, budesonide (PULMICORT) if needed, dicyclomine (BENTYL), HYDROcodone-acetaminophen (NORCO) if needed, levothyroxine (SYNTHROID, LEVOTHROID),  loratadine (CLARITIN), meclizine (ANTIVERT), metoprolol tartrate (LOPRESSOR), Nitro if needed, omeprazole-sodium bicarbonate (ZEGERID), ondansetron (ZOFRAN), PROAIR bring with you if needed, eye drops, sertraline (ZOLOFT)   7 days prior to surgery STOP taking any Aspirin, Aleve, Naproxen, Ibuprofen, Motrin, Advil, Goody's, BC's, all herbal medications, fish oil, and all vitamins   Follow physicians instructions about Xarelto   Do not wear jewelry, make-up or nail polish.  Do not wear lotions, powders, or perfumes, or deoderant.  Do not shave 48 hours prior to surgery.    Do not bring valuables to the hospital.  [facility] is not responsible for any belongings or valuables.  Contacts, dentures or bridgework may not be worn into surgery.  Leave your suitcase in the car.  After surgery it may be brought to your room.  For patients admitted to the hospital, discharge time will be determined by your treatment team.  Patients discharged the day of surgery will not be allowed to drive home.    Special instructions:   [facility]- Preparing For Surgery  Before surgery, you can play an important role. Because skin is not sterile, your skin needs to be as free of germs as possible. You can reduce the number of germs on your skin by washing with  CHG (chlorahexidine gluconate) Soap before surgery.  CHG is an antiseptic cleaner which kills germs and bonds with the skin to continue killing germs even after washing.  Please do not use if you have an allergy to CHG or antibacterial soaps. If your skin becomes reddened/irritated stop using the CHG.  Do not shave (including legs and underarms) for at least 48 hours prior to first CHG shower. It is OK to shave your face.  Please follow these instructions carefully.   1. Shower the NIGHT BEFORE SURGERY and the MORNING OF SURGERY with CHG.   2. If you chose to wash your hair, wash your hair first as usual with your normal shampoo.  3. After you shampoo, rinse your hair and body thoroughly to remove the shampoo.  4. Use CHG as you would any other liquid soap. You can apply CHG directly to the skin and wash gently with a scrungie or a clean washcloth.   5. Apply the CHG Soap to your body ONLY FROM THE NECK DOWN.  Do not use on open wounds or open sores. Avoid contact with your eyes, ears, mouth and genitals (private parts). Wash genitals (private parts) with your normal soap.  6. Wash thoroughly, paying special attention to the area where your surgery will be performed.  7. Thoroughly rinse your body with warm water from the neck down.  8. DO NOT shower/wash with your normal soap after using and rinsing off the CHG Soap.  9. Pat yourself dry with a CLEAN TOWEL.   10. Wear CLEAN PAJAMAS   11. Place  CLEAN SHEETS on your bed the night of your first shower and DO NOT SLEEP WITH PETS.    Day of Surgery: Do not apply any deodorants/lotions. Please wear clean clothes to the hospital/surgery center.      Please read over the following fact sheets that you were given.

## 2017-06-26 ENCOUNTER — Encounter (HOSPITAL_COMMUNITY)
Admission: RE | Admit: 2017-06-26 | Discharge: 2017-06-26 | Disposition: A | Discharge status: Home / Self Care | Payer: Medicare Other | Source: Ambulatory Visit | Attending: Orthopedic Surgery | Admitting: Orthopedic Surgery

## 2017-06-26 ENCOUNTER — Encounter (HOSPITAL_COMMUNITY): Payer: Self-pay

## 2017-06-26 DIAGNOSIS — J45909 Unspecified asthma, uncomplicated: Secondary | ICD-10-CM | POA: Insufficient documentation

## 2017-06-26 DIAGNOSIS — Z01812 Encounter for preprocedural laboratory examination: Secondary | ICD-10-CM | POA: Diagnosis not present

## 2017-06-26 DIAGNOSIS — K219 Gastro-esophageal reflux disease without esophagitis: Secondary | ICD-10-CM | POA: Insufficient documentation

## 2017-06-26 DIAGNOSIS — Z8661 Personal history of infections of the central nervous system: Secondary | ICD-10-CM | POA: Diagnosis not present

## 2017-06-26 DIAGNOSIS — Z87891 Personal history of nicotine dependence: Secondary | ICD-10-CM | POA: Insufficient documentation

## 2017-06-26 DIAGNOSIS — Z7901 Long term (current) use of anticoagulants: Secondary | ICD-10-CM | POA: Insufficient documentation

## 2017-06-26 DIAGNOSIS — Z01818 Encounter for other preprocedural examination: Secondary | ICD-10-CM | POA: Insufficient documentation

## 2017-06-26 DIAGNOSIS — I509 Heart failure, unspecified: Secondary | ICD-10-CM | POA: Diagnosis not present

## 2017-06-26 DIAGNOSIS — I110 Hypertensive heart disease with heart failure: Secondary | ICD-10-CM | POA: Insufficient documentation

## 2017-06-26 DIAGNOSIS — G894 Chronic pain syndrome: Secondary | ICD-10-CM | POA: Insufficient documentation

## 2017-06-26 DIAGNOSIS — I4891 Unspecified atrial fibrillation: Secondary | ICD-10-CM | POA: Diagnosis not present

## 2017-06-26 DIAGNOSIS — E039 Hypothyroidism, unspecified: Secondary | ICD-10-CM | POA: Insufficient documentation

## 2017-06-26 DIAGNOSIS — Z7951 Long term (current) use of inhaled steroids: Secondary | ICD-10-CM | POA: Insufficient documentation

## 2017-06-26 DIAGNOSIS — Z79899 Other long term (current) drug therapy: Secondary | ICD-10-CM | POA: Diagnosis not present

## 2017-06-26 HISTORY — DX: Dorsalgia, unspecified: M54.9

## 2017-06-26 HISTORY — DX: Personal history of other diseases of the digestive system: Z87.19

## 2017-06-26 HISTORY — DX: Personal history of urinary calculi: Z87.442

## 2017-06-26 HISTORY — DX: Other chronic pain: G89.29

## 2017-06-26 LAB — BASIC METABOLIC PANEL
Anion gap: 9 (ref 5–15)
BUN: 12 mg/dL (ref 6–20)
CO2: 25 mmol/L (ref 22–32)
Calcium: 9.4 mg/dL (ref 8.9–10.3)
Chloride: 101 mmol/L (ref 101–111)
Creatinine, Ser: 0.8 mg/dL (ref 0.44–1.00)
GFR calc Af Amer: >60 mL/min (ref >60)
GFR calc non Af Amer: >60 mL/min (ref >60)
Glucose, Bld: 91 mg/dL (ref 65–99)
Potassium: 4.7 mmol/L (ref 3.5–5.1)
Sodium: 135 mmol/L (ref 135–145)

## 2017-06-26 LAB — CBC
HCT: 40 % (ref 36.0–46.0)
Hemoglobin: 13.3 g/dL (ref 12.0–15.0)
MCH: 31.1 pg (ref 26.0–34.0)
MCHC: 33.3 g/dL (ref 30.0–36.0)
MCV: 93.7 fL (ref 78.0–100.0)
Platelets: 256 10*3/uL (ref 150–400)
RBC: 4.27 MIL/uL (ref 3.87–5.11)
RDW: 13.2 % (ref 11.5–15.5)
WBC: 6.8 10*3/uL (ref 4.0–10.5)

## 2017-06-26 LAB — SURGICAL PCR SCREEN
MRSA, PCR: NEGATIVE
Staphylococcus aureus: NEGATIVE

## 2017-06-26 NOTE — Progress Notes (Signed)
PCP - [facility]  Chest x-ray - 02/11/17 (E)  EKG - 01/20/17 (E)  Stress Test - 10/06/06 (E)  ECHO - Never completed in 2015, and pt doesn't remember having to do it.  Cardiac Cath - 2003- 1 Stent  Sleep Study - Denies- Pt sts she asked for a study on her own, and then she cancelled it. CPAP - None  Chart will be sent for anesthesia review due to cardiac hx.  Pt denies having chest pain, sob, or fever at this time. Pt also sts she will find out from her doctor when to stop Xarelto. All instructions explained to the pt, with a verbal understanding of the material. Pt agrees to go over the instructions while at home for a better understanding. The opportunity to ask questions was provided.

## 2017-06-27 ENCOUNTER — Encounter (HOSPITAL_COMMUNITY): Payer: Self-pay

## 2017-06-27 NOTE — Progress Notes (Signed)
Anesthesia Chart Review:  Patient is a 81 year old female scheduled for lumbar spinal cord stimulator insertion on 07/02/2017 with Melina Schools, MD  - PCP is Redge Gainer, MD who cleared pt for surgery - Cardiologist is Minus Breeding, MD who cleared pt for surgery noting pt can hold her xarelto "as needed" without bridging. Last office visit 01/20/17 with Rosaria Ferries, PA - Pulmonologist is Kara Mead, MD  PMH includes:  CAD (stent R eye 2003), atrial fibrillation, CHF, HTN, hypothyroidism, history spinal meningitis, asthma, acute respiratory failure due to pneumonia (04/2014, s/p R VATS, drainage of empyema 05/03/14), post-op N/V, GERD. Former smoker. BMI 29. S/p R ankle arthrodesis 08/27/12, revision 03/31/14, and hardware removal 11/03/14.   Medications include: Pulmicort, denosumab, diltiazem, levothyroxine, metoprolol, Zegerid, albuterol, xarelto, rosuvastatin. Dr. Rolena Infante instructed pt to hold xarelto 5 days before surgery.   Preoperative labs reviewed.  CXR 02/11/17: Chronic lung changes without acute abnormality. Calcified mildly tortuous aorta similar to prior exam.  EKG 01/20/17: Sinus rhythm with first-degree AV block.  Nuclear stress test 02/26/12: Normal stress nuclear study. EF 72%  Cardiac cath 05/04/09:  1. LM normal. 2. LAD with 20-30% multiple lesions in the proximal and mid vessel. Distal vessel small. First and second diagonal branch is small without critical disease. 3. Ramus branch widely patent. 28-30% in-stent restenosis. 4. CX nondominant. 30-40% mid CX disease. 5. RCA dominant. Normal. Single PDA and a small posterolateral branch.  If no changes, I anticipate pt can proceed with surgery as scheduled.   Willeen Cass, FNP-BC [facility]/Anesthesiology Phone: [phone] 06/27/2017 12:52 PM

## 2017-07-02 ENCOUNTER — Encounter (HOSPITAL_COMMUNITY)
Admission: AD | Disposition: A | Discharge status: Home / Self Care | Payer: Self-pay | Source: Ambulatory Visit | Attending: Orthopedic Surgery

## 2017-07-02 ENCOUNTER — Ambulatory Visit (HOSPITAL_COMMUNITY)
Admission: AD | Admit: 2017-07-02 | Discharge: 2017-07-03 | Disposition: A | Discharge status: Home / Self Care | Payer: Medicare Other | Source: Ambulatory Visit | Attending: Orthopedic Surgery | Admitting: Orthopedic Surgery

## 2017-07-02 ENCOUNTER — Ambulatory Visit (HOSPITAL_COMMUNITY): Payer: Medicare Other

## 2017-07-02 ENCOUNTER — Ambulatory Visit (HOSPITAL_COMMUNITY): Payer: Medicare Other | Admitting: Certified Registered Nurse Anesthetist

## 2017-07-02 ENCOUNTER — Ambulatory Visit (HOSPITAL_COMMUNITY): Payer: Medicare Other | Admitting: Emergency Medicine

## 2017-07-02 DIAGNOSIS — E785 Hyperlipidemia, unspecified: Secondary | ICD-10-CM | POA: Diagnosis not present

## 2017-07-02 DIAGNOSIS — M47816 Spondylosis without myelopathy or radiculopathy, lumbar region: Secondary | ICD-10-CM | POA: Insufficient documentation

## 2017-07-02 DIAGNOSIS — I10 Essential (primary) hypertension: Secondary | ICD-10-CM | POA: Insufficient documentation

## 2017-07-02 DIAGNOSIS — M810 Age-related osteoporosis without current pathological fracture: Secondary | ICD-10-CM | POA: Diagnosis not present

## 2017-07-02 DIAGNOSIS — M19049 Primary osteoarthritis, unspecified hand: Secondary | ICD-10-CM | POA: Insufficient documentation

## 2017-07-02 DIAGNOSIS — J45909 Unspecified asthma, uncomplicated: Secondary | ICD-10-CM | POA: Insufficient documentation

## 2017-07-02 DIAGNOSIS — Z886 Allergy status to analgesic agent status: Secondary | ICD-10-CM | POA: Insufficient documentation

## 2017-07-02 DIAGNOSIS — K219 Gastro-esophageal reflux disease without esophagitis: Secondary | ICD-10-CM | POA: Insufficient documentation

## 2017-07-02 DIAGNOSIS — E039 Hypothyroidism, unspecified: Secondary | ICD-10-CM | POA: Insufficient documentation

## 2017-07-02 DIAGNOSIS — Z880 Allergy status to penicillin: Secondary | ICD-10-CM | POA: Insufficient documentation

## 2017-07-02 DIAGNOSIS — Z96651 Presence of right artificial knee joint: Secondary | ICD-10-CM | POA: Insufficient documentation

## 2017-07-02 DIAGNOSIS — Z79899 Other long term (current) drug therapy: Secondary | ICD-10-CM | POA: Diagnosis not present

## 2017-07-02 DIAGNOSIS — Z955 Presence of coronary angioplasty implant and graft: Secondary | ICD-10-CM | POA: Insufficient documentation

## 2017-07-02 DIAGNOSIS — I509 Heart failure, unspecified: Secondary | ICD-10-CM | POA: Diagnosis not present

## 2017-07-02 DIAGNOSIS — F419 Anxiety disorder, unspecified: Secondary | ICD-10-CM | POA: Insufficient documentation

## 2017-07-02 DIAGNOSIS — G8929 Other chronic pain: Secondary | ICD-10-CM | POA: Diagnosis present

## 2017-07-02 DIAGNOSIS — Z419 Encounter for procedure for purposes other than remedying health state, unspecified: Secondary | ICD-10-CM

## 2017-07-02 DIAGNOSIS — E7800 Pure hypercholesterolemia, unspecified: Secondary | ICD-10-CM | POA: Diagnosis not present

## 2017-07-02 DIAGNOSIS — I252 Old myocardial infarction: Secondary | ICD-10-CM | POA: Insufficient documentation

## 2017-07-02 DIAGNOSIS — Z882 Allergy status to sulfonamides status: Secondary | ICD-10-CM | POA: Insufficient documentation

## 2017-07-02 DIAGNOSIS — M961 Postlaminectomy syndrome, not elsewhere classified: Secondary | ICD-10-CM | POA: Diagnosis not present

## 2017-07-02 DIAGNOSIS — I2510 Atherosclerotic heart disease of native coronary artery without angina pectoris: Secondary | ICD-10-CM | POA: Insufficient documentation

## 2017-07-02 DIAGNOSIS — M1712 Unilateral primary osteoarthritis, left knee: Secondary | ICD-10-CM | POA: Diagnosis not present

## 2017-07-02 DIAGNOSIS — F329 Major depressive disorder, single episode, unspecified: Secondary | ICD-10-CM | POA: Insufficient documentation

## 2017-07-02 DIAGNOSIS — Z87891 Personal history of nicotine dependence: Secondary | ICD-10-CM | POA: Insufficient documentation

## 2017-07-02 DIAGNOSIS — M19011 Primary osteoarthritis, right shoulder: Secondary | ICD-10-CM | POA: Insufficient documentation

## 2017-07-02 DIAGNOSIS — G894 Chronic pain syndrome: Secondary | ICD-10-CM | POA: Insufficient documentation

## 2017-07-02 DIAGNOSIS — Z7901 Long term (current) use of anticoagulants: Secondary | ICD-10-CM | POA: Diagnosis not present

## 2017-07-02 DIAGNOSIS — M797 Fibromyalgia: Secondary | ICD-10-CM | POA: Diagnosis not present

## 2017-07-02 DIAGNOSIS — T85192A Other mechanical complication of implanted electronic neurostimulator (electrode) of spinal cord, initial encounter: Secondary | ICD-10-CM | POA: Diagnosis not present

## 2017-07-02 DIAGNOSIS — M5136 Other intervertebral disc degeneration, lumbar region: Secondary | ICD-10-CM | POA: Diagnosis not present

## 2017-07-02 HISTORY — PX: SPINAL CORD STIMULATOR INSERTION: SHX5378

## 2017-07-02 SURGERY — INSERTION, SPINAL CORD STIMULATOR, LUMBAR
Anesthesia: General | Site: Back

## 2017-07-02 MED ORDER — ROCURONIUM BROMIDE 100 MG/10ML IV SOLN
INTRAVENOUS | Status: DC | PRN
  Administered 2017-07-02: 30 mg via INTRAVENOUS

## 2017-07-02 MED ORDER — METHOCARBAMOL 500 MG PO TABS
ORAL_TABLET | ORAL | Status: AC
  Administered 2017-07-02: 500 mg via ORAL
  Filled 2017-07-02: qty 1

## 2017-07-02 MED ORDER — METOPROLOL TARTRATE 25 MG PO TABS: 50.0000 mg | ORAL_TABLET | Freq: Every day | ORAL | Status: DC

## 2017-07-02 MED ORDER — FENTANYL CITRATE (PF) 100 MCG/2ML IJ SOLN
INTRAMUSCULAR | Status: AC
  Filled 2017-07-02: qty 2

## 2017-07-02 MED ORDER — ACETAMINOPHEN 10 MG/ML IV SOLN
INTRAVENOUS | Status: DC | PRN
  Administered 2017-07-02: 1000 mg via INTRAVENOUS

## 2017-07-02 MED ORDER — OXYCODONE HCL 5 MG/5ML PO SOLN: 5.0000 mg | Freq: Once | ORAL | Status: AC | PRN

## 2017-07-02 MED ORDER — PROPOFOL 500 MG/50ML IV EMUL
INTRAVENOUS | Status: DC | PRN
  Administered 2017-07-02: 25 ug/kg/min via INTRAVENOUS

## 2017-07-02 MED ORDER — THROMBIN 20000 UNITS EX SOLR
CUTANEOUS | Status: DC | PRN
  Administered 2017-07-02: 20 mL via TOPICAL

## 2017-07-02 MED ORDER — VANCOMYCIN HCL 10 G IV SOLR
1500.0000 mg | INTRAVENOUS | Status: AC
  Administered 2017-07-02: 1500 mg via INTRAVENOUS
  Filled 2017-07-02: qty 1500

## 2017-07-02 MED ORDER — ACETAMINOPHEN 10 MG/ML IV SOLN
INTRAVENOUS | Status: AC
  Filled 2017-07-02: qty 100

## 2017-07-02 MED ORDER — METHOCARBAMOL 1000 MG/10ML IJ SOLN: 500.0000 mg | Freq: Four times a day (QID) | INTRAMUSCULAR | Status: DC | PRN

## 2017-07-02 MED ORDER — ONDANSETRON HCL 4 MG/2ML IJ SOLN
INTRAMUSCULAR | Status: DC | PRN
  Administered 2017-07-02: 4 mg via INTRAVENOUS

## 2017-07-02 MED ORDER — ONDANSETRON HCL 4 MG/2ML IJ SOLN
INTRAMUSCULAR | Status: AC
Start: 2017-07-02 — End: 2017-07-02
  Filled 2017-07-02: qty 2

## 2017-07-02 MED ORDER — SUGAMMADEX SODIUM 200 MG/2ML IV SOLN
INTRAVENOUS | Status: DC | PRN
  Administered 2017-07-02: 150 mg via INTRAVENOUS

## 2017-07-02 MED ORDER — OXYCODONE HCL 5 MG PO TABS
10.0000 mg | ORAL_TABLET | ORAL | Status: DC | PRN
  Administered 2017-07-02: 5 mg via ORAL
  Administered 2017-07-02 – 2017-07-03 (×3): 10 mg via ORAL
  Filled 2017-07-02 (×3): qty 2

## 2017-07-02 MED ORDER — POLYETHYLENE GLYCOL 3350 17 G PO PACK: 17.0000 g | PACK | Freq: Every day | ORAL | Status: DC | PRN

## 2017-07-02 MED ORDER — ONDANSETRON HCL 4 MG/2ML IJ SOLN: 4.0000 mg | Freq: Once | INTRAMUSCULAR | Status: DC | PRN

## 2017-07-02 MED ORDER — OXYCODONE HCL 5 MG PO TABS
ORAL_TABLET | ORAL | Status: AC
  Administered 2017-07-02: 5 mg via ORAL
  Filled 2017-07-02: qty 1

## 2017-07-02 MED ORDER — LIDOCAINE HCL (CARDIAC) 20 MG/ML IV SOLN
INTRAVENOUS | Status: DC | PRN
  Administered 2017-07-02: 60 mg via INTRATRACHEAL

## 2017-07-02 MED ORDER — PROPOFOL 10 MG/ML IV BOLUS
INTRAVENOUS | Status: DC | PRN
  Administered 2017-07-02: 80 mg via INTRAVENOUS
  Administered 2017-07-02: 70 mg via INTRAVENOUS
  Administered 2017-07-02: 50 mg via INTRAVENOUS

## 2017-07-02 MED ORDER — SODIUM CHLORIDE 0.9% FLUSH: 3.0000 mL | INTRAVENOUS | Status: DC | PRN

## 2017-07-02 MED ORDER — GLYCOPYRROLATE 0.2 MG/ML IJ SOLN
INTRAMUSCULAR | Status: DC | PRN
  Administered 2017-07-02 (×2): 0.1 mg via INTRAVENOUS

## 2017-07-02 MED ORDER — LEVOTHYROXINE SODIUM 100 MCG PO TABS
50.0000 ug | ORAL_TABLET | Freq: Every day | ORAL | Status: DC
  Administered 2017-07-03: 50 ug via ORAL

## 2017-07-02 MED ORDER — ONDANSETRON HCL 4 MG PO TABS: 4.0000 mg | ORAL_TABLET | Freq: Four times a day (QID) | ORAL | Status: DC | PRN

## 2017-07-02 MED ORDER — ONDANSETRON HCL 4 MG/2ML IJ SOLN
4.0000 mg | Freq: Four times a day (QID) | INTRAMUSCULAR | Status: DC | PRN
Start: 2017-07-02 — End: 2017-07-03

## 2017-07-02 MED ORDER — OXYCODONE HCL 5 MG PO TABS
5.0000 mg | ORAL_TABLET | Freq: Once | ORAL | Status: AC | PRN
  Administered 2017-07-02: 5 mg via ORAL

## 2017-07-02 MED ORDER — PHENOL 1.4 % MT LIQD: 1.0000 | OROMUCOSAL | Status: DC | PRN

## 2017-07-02 MED ORDER — ALPRAZOLAM 0.5 MG PO TABS
1.0000 mg | ORAL_TABLET | Freq: Every day | ORAL | Status: DC
  Administered 2017-07-02: 1 mg via ORAL
  Filled 2017-07-02: qty 2

## 2017-07-02 MED ORDER — ROCURONIUM BROMIDE 50 MG/5ML IV SOLN
INTRAVENOUS | Status: AC
Start: 2017-07-02 — End: 2017-07-02
  Filled 2017-07-02: qty 1

## 2017-07-02 MED ORDER — ACETAMINOPHEN 650 MG RE SUPP
650.0000 mg | RECTAL | Status: DC | PRN
Start: 2017-07-02 — End: 2017-07-03

## 2017-07-02 MED ORDER — VANCOMYCIN HCL IN DEXTROSE 1-5 GM/200ML-% IV SOLN
1000.0000 mg | Freq: Once | INTRAVENOUS | Status: AC
  Administered 2017-07-03: 1000 mg via INTRAVENOUS
  Filled 2017-07-02: qty 200

## 2017-07-02 MED ORDER — FENTANYL CITRATE (PF) 250 MCG/5ML IJ SOLN
INTRAMUSCULAR | Status: DC | PRN
  Administered 2017-07-02 (×2): 25 ug via INTRAVENOUS
  Administered 2017-07-02: 50 ug via INTRAVENOUS
  Administered 2017-07-02: 25 ug via INTRAVENOUS

## 2017-07-02 MED ORDER — LACTATED RINGERS IV SOLN
INTRAVENOUS | Status: DC
  Administered 2017-07-02: 22:00:00 via INTRAVENOUS

## 2017-07-02 MED ORDER — FENTANYL CITRATE (PF) 100 MCG/2ML IJ SOLN
25.0000 ug | INTRAMUSCULAR | Status: DC | PRN
  Administered 2017-07-02 (×2): 50 ug via INTRAVENOUS

## 2017-07-02 MED ORDER — DILTIAZEM HCL ER COATED BEADS 120 MG PO CP24
120.0000 mg | ORAL_CAPSULE | Freq: Every day | ORAL | Status: DC
  Administered 2017-07-03: 120 mg via ORAL
  Filled 2017-07-02: qty 1

## 2017-07-02 MED ORDER — NITROGLYCERIN 0.4 MG SL SUBL: 0.4000 mg | SUBLINGUAL_TABLET | SUBLINGUAL | Status: DC | PRN

## 2017-07-02 MED ORDER — 0.9 % SODIUM CHLORIDE (POUR BTL) OPTIME
TOPICAL | Status: DC | PRN
  Administered 2017-07-02: 1000 mL

## 2017-07-02 MED ORDER — THROMBIN 20000 UNITS EX SOLR
CUTANEOUS | Status: AC
  Filled 2017-07-02: qty 20000

## 2017-07-02 MED ORDER — LACTATED RINGERS IV SOLN
INTRAVENOUS | Status: DC
  Administered 2017-07-02 (×2): via INTRAVENOUS

## 2017-07-02 MED ORDER — SODIUM CHLORIDE 0.9% FLUSH
3.0000 mL | Freq: Two times a day (BID) | INTRAVENOUS | Status: DC
  Administered 2017-07-02: 3 mL via INTRAVENOUS

## 2017-07-02 MED ORDER — MENTHOL 3 MG MT LOZG: 1.0000 | LOZENGE | OROMUCOSAL | Status: DC | PRN

## 2017-07-02 MED ORDER — BENZONATATE 100 MG PO CAPS
100.0000 mg | ORAL_CAPSULE | Freq: Two times a day (BID) | ORAL | Status: DC
  Administered 2017-07-02 – 2017-07-03 (×2): 100 mg via ORAL
  Filled 2017-07-02 (×2): qty 1

## 2017-07-02 MED ORDER — HEMOSTATIC AGENTS (NO CHARGE) OPTIME
TOPICAL | Status: DC | PRN
  Administered 2017-07-02 (×2): 1 via TOPICAL

## 2017-07-02 MED ORDER — BUPIVACAINE-EPINEPHRINE 0.25% -1:200000 IJ SOLN
INTRAMUSCULAR | Status: DC | PRN
  Administered 2017-07-02: 20 mL

## 2017-07-02 MED ORDER — SODIUM CHLORIDE 0.9 % IV SOLN: 250.0000 mL | INTRAVENOUS | Status: DC

## 2017-07-02 MED ORDER — ALBUTEROL SULFATE (2.5 MG/3ML) 0.083% IN NEBU: 3.0000 mL | INHALATION_SOLUTION | Freq: Four times a day (QID) | RESPIRATORY_TRACT | Status: DC | PRN

## 2017-07-02 MED ORDER — METHOCARBAMOL 500 MG PO TABS
500.0000 mg | ORAL_TABLET | Freq: Four times a day (QID) | ORAL | Status: DC | PRN
  Administered 2017-07-02 – 2017-07-03 (×2): 500 mg via ORAL
  Filled 2017-07-02: qty 1

## 2017-07-02 MED ORDER — ALBUTEROL SULFATE HFA 108 (90 BASE) MCG/ACT IN AERS
INHALATION_SPRAY | RESPIRATORY_TRACT | Status: DC | PRN
  Administered 2017-07-02: 4 via RESPIRATORY_TRACT

## 2017-07-02 MED ORDER — LIDOCAINE HCL (CARDIAC) 20 MG/ML IV SOLN
INTRAVENOUS | Status: AC
  Filled 2017-07-02: qty 5

## 2017-07-02 MED ORDER — SERTRALINE HCL 50 MG PO TABS: 100.0000 mg | ORAL_TABLET | Freq: Every day | ORAL | Status: DC

## 2017-07-02 MED ORDER — MECLIZINE HCL 12.5 MG PO TABS
25.0000 mg | ORAL_TABLET | Freq: Three times a day (TID) | ORAL | Status: DC
  Administered 2017-07-02 – 2017-07-03 (×2): 25 mg via ORAL
  Filled 2017-07-02 (×2): qty 2

## 2017-07-02 MED ORDER — ARTIFICIAL TEARS OPHTHALMIC OINT
TOPICAL_OINTMENT | OPHTHALMIC | Status: DC | PRN
  Administered 2017-07-02: 1 via OPHTHALMIC

## 2017-07-02 MED ORDER — FENTANYL CITRATE (PF) 250 MCG/5ML IJ SOLN
INTRAMUSCULAR | Status: AC
  Filled 2017-07-02: qty 5

## 2017-07-02 MED ORDER — DEXAMETHASONE SODIUM PHOSPHATE 10 MG/ML IJ SOLN
INTRAMUSCULAR | Status: DC | PRN
  Administered 2017-07-02: 10 mg via INTRAVENOUS

## 2017-07-02 MED ORDER — BUPIVACAINE-EPINEPHRINE (PF) 0.25% -1:200000 IJ SOLN
INTRAMUSCULAR | Status: AC
Start: 2017-07-02 — End: 2017-07-02
  Filled 2017-07-02: qty 30

## 2017-07-02 MED ORDER — DEXAMETHASONE SODIUM PHOSPHATE 10 MG/ML IJ SOLN
INTRAMUSCULAR | Status: AC
  Filled 2017-07-02: qty 1

## 2017-07-02 MED ORDER — BUDESONIDE 0.5 MG/2ML IN SUSP
0.5000 mg | Freq: Two times a day (BID) | RESPIRATORY_TRACT | Status: DC | PRN
  Filled 2017-07-02: qty 2

## 2017-07-02 MED ORDER — ACETAMINOPHEN 325 MG PO TABS: 650.0000 mg | ORAL_TABLET | ORAL | Status: DC | PRN

## 2017-07-02 SURGICAL SUPPLY — 56 items
CANISTER SUCT 3000ML PPV (MISCELLANEOUS) ×3 IMPLANT
CLOSURE STERI-STRIP 1/2X4 (GAUZE/BANDAGES/DRESSINGS) ×1
CLSR STERI-STRIP ANTIMIC 1/2X4 (GAUZE/BANDAGES/DRESSINGS) ×2 IMPLANT
CONTROLLER PROCLAIM IPG E5 PAT (Neuro Prosthesis/Implant) ×2 IMPLANT
COVER PROBE W GEL 5X96 (DRAPES) IMPLANT
COVER SURGICAL LIGHT HANDLE (MISCELLANEOUS) ×3 IMPLANT
DRAPE C-ARM 42X72 X-RAY (DRAPES) ×3 IMPLANT
DRAPE INCISE IOBAN 85X60 (DRAPES) ×3 IMPLANT
DRAPE SURG 17X23 STRL (DRAPES) ×3 IMPLANT
DRAPE U-SHAPE 47X51 STRL (DRAPES) ×3 IMPLANT
DRSG AQUACEL AG ADV 3.5X 6 (GAUZE/BANDAGES/DRESSINGS) ×6 IMPLANT
DRSG OPSITE POSTOP 4X8 (GAUZE/BANDAGES/DRESSINGS) ×4 IMPLANT
DURAPREP 26ML APPLICATOR (WOUND CARE) ×3 IMPLANT
ELECT BLADE 4.0 EZ CLEAN MEGAD (MISCELLANEOUS)
ELECT CAUTERY BLADE 6.4 (BLADE) ×3 IMPLANT
ELECT PENCIL ROCKER SW 15FT (MISCELLANEOUS) ×3 IMPLANT
ELECT REM PT RETURN 9FT ADLT (ELECTROSURGICAL) ×3
ELECTRODE BLDE 4.0 EZ CLN MEGD (MISCELLANEOUS) IMPLANT
ELECTRODE REM PT RTRN 9FT ADLT (ELECTROSURGICAL) ×1 IMPLANT
GLOVE BIO SURGEON STRL SZ7 (GLOVE) ×3 IMPLANT
GLOVE BIOGEL PI IND STRL 7.0 (GLOVE) ×1 IMPLANT
GLOVE BIOGEL PI IND STRL 8.5 (GLOVE) ×1 IMPLANT
GLOVE BIOGEL PI INDICATOR 7.0 (GLOVE) ×2
GLOVE BIOGEL PI INDICATOR 8.5 (GLOVE) ×2
GLOVE SS N UNI LF 8.5 STRL (GLOVE) ×6 IMPLANT
GOWN STRL REUS W/ TWL LRG LVL3 (GOWN DISPOSABLE) ×2 IMPLANT
GOWN STRL REUS W/TWL 2XL LVL3 (GOWN DISPOSABLE) ×3 IMPLANT
GOWN STRL REUS W/TWL LRG LVL3 (GOWN DISPOSABLE) ×6
KIT BASIN OR (CUSTOM PROCEDURE TRAY) ×3 IMPLANT
KIT ROOM TURNOVER OR (KITS) ×3 IMPLANT
LEAD LAMITRODE 60CM 8CH (Lead) ×2 IMPLANT
NDL SPNL 18GX3.5 QUINCKE PK (NEEDLE) ×1 IMPLANT
NEEDLE 22X1 1/2 (OR ONLY) (NEEDLE) ×3 IMPLANT
NEEDLE SPNL 18GX3.5 QUINCKE PK (NEEDLE) ×6 IMPLANT
NS IRRIG 1000ML POUR BTL (IV SOLUTION) ×3 IMPLANT
PACK LAMINECTOMY ORTHO (CUSTOM PROCEDURE TRAY) ×3 IMPLANT
PACK UNIVERSAL I (CUSTOM PROCEDURE TRAY) ×3 IMPLANT
PAD ARMBOARD 7.5X6 YLW CONV (MISCELLANEOUS) ×6 IMPLANT
SPATULA SILICONE BRAIN 10MM (MISCELLANEOUS) IMPLANT
SPONGE LAP 4X18 X RAY DECT (DISPOSABLE) IMPLANT
SPONGE SURGIFOAM ABS GEL 100 (HEMOSTASIS) ×3 IMPLANT
STAPLER VISISTAT 35W (STAPLE) ×3 IMPLANT
SURGIFLO W/THROMBIN 8M KIT (HEMOSTASIS) ×3 IMPLANT
SUT BONE WAX W31G (SUTURE) ×3 IMPLANT
SUT ETHIBOND 2 OS 4 DA (SUTURE) ×3 IMPLANT
SUT MNCRL AB 3-0 PS2 18 (SUTURE) ×6 IMPLANT
SUT NOVAFIL 6 0 SBE 1 (SUTURE) ×2 IMPLANT
SUT VIC AB 1 CT1 18XCR BRD 8 (SUTURE) ×2 IMPLANT
SUT VIC AB 1 CT1 8-18 (SUTURE) ×6
SUT VIC AB 2-0 CT1 18 (SUTURE) ×3 IMPLANT
SYR BULB IRRIGATION 50ML (SYRINGE) ×3 IMPLANT
SYR CONTROL 10ML LL (SYRINGE) ×3 IMPLANT
TOWEL OR 17X24 6PK STRL BLUE (TOWEL DISPOSABLE) ×3 IMPLANT
TOWEL OR 17X26 10 PK STRL BLUE (TOWEL DISPOSABLE) ×3 IMPLANT
WATER STERILE IRR 1000ML POUR (IV SOLUTION) ×3 IMPLANT
YANKAUER SUCT BULB TIP NO VENT (SUCTIONS) ×2 IMPLANT

## 2017-07-02 NOTE — Addendum Note (Signed)
Addendum  created 07/02/17 1703 by Roberts Gaudy, MD   Order list changed, Order sets accessed

## 2017-07-02 NOTE — Transfer of Care (Signed)
Immediate Anesthesia Transfer of Care Note  Patient: [name]  Procedure(s) Performed: Procedure(s) with comments: LUMBAR SPINAL CORD STIMULATOR INSERTION (N/A) - 120 mins  Patient Location: PACU  Anesthesia Type:General  Level of Consciousness: awake, alert  and oriented  Airway & Oxygen Therapy: Patient Spontanous Breathing and Patient connected to nasal cannula oxygen  Post-op Assessment: Report given to RN and Post -op Vital signs reviewed and stable  Post vital signs: Reviewed and stable  Last Vitals:  Vitals:   07/02/17 1135  BP: (!) 123/46  Pulse: (!) 55  Resp: 18  Temp: 36.7 C    Last Pain:  Vitals:   07/02/17 1159  TempSrc:   PainSc: 4       Patients Stated Pain Goal: 2 (08/81/10 3159)  Complications: No apparent anesthesia complications

## 2017-07-02 NOTE — Brief Op Note (Signed)
07/02/2017  4:32 PM  PATIENT:  [name]  81 y.o. female  PRE-OPERATIVE DIAGNOSIS:  Chronic pain syndrome  POST-OPERATIVE DIAGNOSIS:  Chronic pain syndrome  PROCEDURE:  Procedure(s) with comments: LUMBAR SPINAL CORD STIMULATOR INSERTION (N/A) - 120 mins  SURGEON:  Surgeon(s) and Role:    Melina Schools, MD - Primary  PHYSICIAN ASSISTANT:   ASSISTANTS: none   ANESTHESIA:   spinal  EBL:  Total I/O In: 1000 [I.V.:1000] Out: 325 [Urine:300; Blood:25]  BLOOD ADMINISTERED:none  DRAINS: none   LOCAL MEDICATIONS USED:  MARCAINE     SPECIMEN:  No Specimen  DISPOSITION OF SPECIMEN:  N/A  COUNTS:  YES  TOURNIQUET:  * No tourniquets in log *  DICTATION: .Dragon Dictation  PLAN OF CARE: Admit to inpatient   PATIENT DISPOSITION:  PACU - hemodynamically stable.

## 2017-07-02 NOTE — Anesthesia Postprocedure Evaluation (Signed)
Anesthesia Post Note  Patient: [name]  Procedure(s) Performed: Procedure(s) (LRB): LUMBAR SPINAL CORD STIMULATOR INSERTION (N/A)     Patient location during evaluation: PACU Anesthesia Type: General Level of consciousness: awake and alert Pain management: pain level controlled Vital Signs Assessment: post-procedure vital signs reviewed and stable Respiratory status: spontaneous breathing, nonlabored ventilation, respiratory function stable and patient connected to nasal cannula oxygen Cardiovascular status: blood pressure returned to baseline and stable Postop Assessment: no signs of nausea or vomiting Anesthetic complications: no    Last Vitals:  Vitals:   07/02/17 1636 07/02/17 1640  BP:  (!) 142/59  Pulse:  (!) 57  Resp:  12  Temp: (!) 36.4 C     Last Pain:  Vitals:   07/02/17 1636  TempSrc:   PainSc: 10-Worst pain ever                 Akaylah Lalley

## 2017-07-02 NOTE — Anesthesia Preprocedure Evaluation (Addendum)
Anesthesia Evaluation  Patient identified by MRN, date of birth, ID band Patient awake    Reviewed: Allergy & Precautions, NPO status , Patient's Chart, lab work & pertinent test results, reviewed documented beta blocker date and time   History of Anesthesia Complications (+) PONV, PROLONGED EMERGENCE and history of anesthetic complications  Airway Mallampati: II  TM Distance: >3 FB Neck ROM: Full    Dental no notable dental hx. (+) Dental Advisory Given, Partial Upper   Pulmonary asthma , former smoker,    Pulmonary exam normal        Cardiovascular hypertension, + CAD and + Past MI  Normal cardiovascular exam+ dysrhythmias Atrial Fibrillation  Rhythm:Regular Rate:Normal  Nuclear stress test 02/26/12: Normal stress nuclear study. EF 72%  Cardiac cath 05/04/09:  1. LM normal. 2. LAD with 20-30% multiple lesions in the proximal and mid vessel. Distal vessel small. First and second diagonal branch is small without critical disease. 3. Ramus branch widely patent. 28-30% in-stent restenosis. 4. CX nondominant. 30-40% mid CX disease. 5. RCA dominant. Normal. Single PDA and a small posterolateral branch.    Neuro/Psych PSYCHIATRIC DISORDERS Anxiety Depression negative neurological ROS  negative psych ROS   GI/Hepatic Neg liver ROS, hiatal hernia, GERD  ,  Endo/Other  Hypothyroidism   Renal/GU negative Renal ROS  negative genitourinary   Musculoskeletal negative musculoskeletal ROS (+)   Abdominal   Peds negative pediatric ROS (+)  Hematology negative hematology ROS (+)   Anesthesia Other Findings   Reproductive/Obstetrics negative OB ROS                            Anesthesia Physical Anesthesia Plan  ASA: III  Anesthesia Plan: General   Post-op Pain Management:    Induction:   PONV Risk Score and Plan: 3 and Ondansetron, Dexamethasone and Diphenhydramine  Airway Management  Planned: Oral ETT  Additional Equipment:   Intra-op Plan:   Post-operative Plan: Extubation in OR  Informed Consent: I have reviewed the patients History and Physical, chart, labs and discussed the procedure including the risks, benefits and alternatives for the proposed anesthesia with the patient or authorized representative who has indicated his/her understanding and acceptance.   Dental advisory given  Plan Discussed with: Anesthesiologist and CRNA  Anesthesia Plan Comments:         Anesthesia Quick Evaluation

## 2017-07-02 NOTE — H&P (Signed)
History of Present Illness  The patient is a 81 year old female who presents for a follow-up for H & P. The patient is scheduled for a SCS placement to be performed by Dr. Duane Lope D. Rolena Infante, MD at [facility] on 07-02-17 . Please see the hospital record for complete dictated history and physical. The pt reports a hx of Asthma and Chronic Bronchitis. She is a non smoker.  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)  Hallux valgus (M20.10)  S/P arthroscopy of knee (V45.89)  Heel pain (M79.673)  Left knee pain (M25.562)  Pre-MRI Lab Exam (V37.106)  Trigger finger (M65.30)  Aftercare following surgery of the musculoskeletal system (Z47.89)  Sprain of right foot, subsequent encounter (S93.601D)  Lumbar radiculopathy (M54.16)  Primary osteoarthritis of lumbar spine (M47.816)  Chronic left-sided low back pain with left-sided sciatica (M54.42)  Degenerative lumbar disc (M51.36)  Localized primary osteoarthritis of right shoulder region (M19.011)  S/P ankle fusion (Z98.1)  Trochanteric bursitis of left hip (M70.62)  Callous ulcer, limited to breakdown of skin (L98.491)  Osteoarthritis of left knee (M17.12)  Aftercare following right knee joint replacement surgery (Z47.1)  Problems Reconciled   Allergies  NAPROSYN [06/12/2005]: Sulfa [02/06/2000]: Aspirin [02/06/2000]: Penicillin [02/06/2000]: Allergies Reconciled   Medication History  Norco (5-325MG  Tablet, 1 (one) Oral po q 6hr prn pain, Taken starting 06/13/2017) Active. C-Combination Pain Cream (Diclo/Baclo/Cyclo/Gaba/Lido) (3/2/2/6/2.5% Cream, 1 (one) pump External apply to knee bid, Taken starting 11/11/2016) Active. Diltiazem HCl ER Coated Beads (120MG  Capsule ER 24HR, Oral) Active. Ondansetron HCl (4MG  Tablet, Oral) Active. Dextromethorphan HBr Active. Guaifenesin 600/PSE 120 (600-120MG  Tablet ER 12HR, Oral) Active. Cardizem (30MG  Tablet, Oral)  Active. Fluticasone Furoate (100MCG/ACT Aero Pow Br Act, Inhalation) Active. Ipratropium Bromide (0.02% Solution, Inhalation) Active. Xopenex (0.63MG /3ML Nebulized Soln, Inhalation) Active. Synthroid (50MCG Tablet, Oral) Active. Claritin (10MG  Tablet, Oral) Active. Singulair (10MG  Tablet, Oral) Active. Multiple Vitamin (Oral) Active. Vitamin C (500MG  Capsule, Oral) Active. Omega 3 (1200MG  Capsule, Oral) Active. Zegerid (40-1100MG  Capsule, Oral) Active. Xarelto (20MG  Tablet, Oral) Active. Crestor (20MG  Tablet, Oral) Active. Zoloft (100MG  Tablet, Oral) Active. Metoprolol Succinate ER (50MG  Tablet ER 24HR, Oral) Active. (#2 q am and 1 qhs as of 03/31/17) Medications Reconciled  Past Surgical History  Ankle Surgery  right Foot Surgery  right Heart Stents  Hysterectomy  partial (non-cancerous) Sinus Surgery  Total Knee Replacement  right Asthma  Chronic Pain  Depression  Fibromyalgia  Gastroesophageal Reflux Disease  Myocardial infarction  Osteoarthritis  Osteoporosis  Hypothyroidism  Coronary artery disease  Anxiety Disorder  Breast Biopsy  left Arthroscopy of Knee  right Spinal Decompression  neck and lower back Rotator Cuff Repair  bilateral Breast Mass; Local Excision  left  Other Problems  High blood pressure  Hypercholesterolemia  Hyperthyroidism   Vitals  06/27/2017 10:26 AM Weight: 151 lb Height: 74in Body Surface Area: 1.93 m Body Mass Index: 19.39 kg/m  Temp.: 98.58F(Oral)  Pulse: 61 (Regular)  BP: 113/68 (Sitting, Left Arm, Standard)  General General Appearance-Not in acute distress. Orientation-Oriented X3. Build & Nutrition-Well nourished and Well developed.  Integumentary General Characteristics Surgical Scars - no surgical scar evidence of previous lumbar surgery. Lumbar Spine-Skin examination of the lumbar spine is without deformity, skin lesions, lacerations or abrasions.  Chest and  Lung Exam Auscultation Breath sounds - Normal and Clear.  Cardiovascular Auscultation Rhythm - Regular rate and rhythm.  Abdomen Palpation/Percussion Palpation and Percussion of the abdomen reveal - Soft, Non Tender and No Rebound tenderness.  Peripheral Vascular Lower Extremity Palpation - Posterior tibial pulse - Bilateral - 2+. Dorsalis pedis pulse - Bilateral - 2+.  Neurologic Sensation Lower Extremity - Bilateral - sensation is intact in the lower extremity. Reflexes Patellar Reflex - Bilateral - 2+. Achilles Reflex - Bilateral - 2+. Clonus - Bilateral - clonus not present. Hoffman's Sign - Bilateral - Hoffman's sign not present. Testing Seated Straight Leg Raise - Bilateral - Seated straight leg raise negative.  Musculoskeletal Spine/Ribs/Pelvis  Lumbosacral Spine: Inspection and Palpation - Tenderness - left lumbar paraspinals tender to palpation and right lumbar paraspinals tender to palpation. Strength and Tone: Strength - Hip Flexion - Bilateral - 5/5. Knee Extension - Bilateral - 5/5. Knee Flexion - Bilateral - 5/5. Ankle Dorsiflexion - Left - 5/5. Right - 2+/5. Ankle Plantarflexion - Left - 5/5. Right - 2+/5. Heel walk - Bilateral - unable to heel walk. Toe Walk - Bilateral - unable to walk on toes. ROM - Flexion - moderately decreased range of motion and painful. Extension - moderately decreased range of motion and painful. Left Lateral Bending - moderately decreased range of motion and painful. Right Lateral Bending - moderately decreased range of motion and painful. Pain - neither flexion or extension is more painful than the other. Lumbosacral Spine - Waddell's Signs - no Waddell's signs present. Lower Extremity Range of Motion - No true hip, knee or ankle pain with range of motion. Gait and Station - [employer] - cane and wheelchair.   Assessment & Plan  Goal Of Surgery: Discussed that goal of surgery is to reduce pain and improve function and quality of  life. Patient is aware that despite all appropriate treatment that there pain and function could be the same, worse, or different.  She states overall pain was reduced. Her quality of life was better. Subsequently, I have been able to get the MRI of her thoracic spine. There are some significant changes. She has a nonenhancing cystic intermediary lesion localized at the T4 level causing some thinning of the right margin of the cord with possible communication with the intradural extramedullary cystic lesion. There is extensive intradural cystic lesion extending caudally all the way through to at least L2.   I think it is reasonable to move forward with trying to place percutaneous leads and then converting them for permanent implantation. My hope would be to place one or possibly two, percutaneous leads through a small open incision. This would then be connected to a battery. We will get preoperative clearance from her cardiologist and her primary care physician and move forward with surgery in a timely fashion.  We have reviewed the risks which include infection, bleeding, death, stroke, paralysis, catastrophic spinal cord injury, leak of spinal fluid, no relief of symptoms, need for further surgery, ongoing or worse pain.

## 2017-07-02 NOTE — Op Note (Signed)
Operative note.  Preoperative diagnosis. Chronic pain syndrome. Failed back syndrome.  Postoperative diagnosis. Same.Marland Kitchen  Operative note. Implantation spinal cord stimulator.  Implant used. St. Jude's S8 Lamitrode.  Nonrechargeable battery.  Indications. A pleasant 81 year old woman has had chronic debilitating back buttock and bilateral leg pain. She had previous lumbar decompressive surgery for excision of the syrinx. Unfortunately she is continue to have severe pain. She recently had a spinal cord stimulator trial had successful. As a result we elected to move forward with the permanent implant. All appropriate risks benefits and alternatives to surgery were discussed and consent was obtained.  Operative note.  Patient was brought the operating room placed supine on the operating table. After successful induction of general anesthesia and endotracheal intubation teds SCDs and a Foley were inserted. Patient was turned prone onto the Wilson frame and all bony prominences were well-padded. Timeout was taken to confirm patient procedure and all other important data. The back was then prepped and draped in a standard fashion.  Incision was mapped out and infiltrated with quarter percent Marcaine. X-ray was used to identify the T10-11 disc space. Patient had maximum programmed the T9 level so we elected to proceed with a T10 laminotomy. Midline incision was made centered over the T10-11 disc space sharp dissection was carried out down to the deep fascia I stripped the deep fascia to expose the spinous process of T10 and T11. I then took another set of x-rays to confirm that I was at the appropriate level. Once confirmed I used a double-action Leksell rongeur to remove the bulk of the spinous process of T10. I then used my 2 mm Kerrison to perform a laminotomy of T10. Penfield 4 to dissect through the ligamentum flavum and I resected this with my 2 mm Kerrison punch. This point I have expose the dorsal  aspect of the thecal sac. I then gently advanced the S8 Lamitrode St. Jude's paddle. It easily advanced and no undue tension. Once it was finally and it was just to the right of midline which is where her maximum programming was occurring. According to the [facility]. Jude relapses was adequately positioned and was similar to the trial. She felt associated get good coverage for her back and leg pain. At this point I then secured the leads directly to the T8 11 spinous process with Ethibond suture.  A second incision was made over the battery site I dissected down proximal 4 cm and created paddle cavity. I then used the submuscular passing to pass the electrode wire from the thoracic wound to the gluteal wound. I connected to the battery torqued into position. The battery was then tested and noted to be functioning fine. At this point I then secured the battery directly to the deep fascia with 2 #1 Vicryl sutures.  With the battery properly positioned in the leads still in place I took final x-rays which were satisfactory. At this point I then irrigated both wounds copes of normal saline and made sure hemostasis using FloSeal I and direct pressure. Prior to putting the battery and I did obtain hemostasis using electrocautery and bipolar. Both wounds were closed in layered fashion with interrupted #1 Vicryl suture, 2-0 Vicryl suture, and 3-0 Monocryl. Steri-Strips dry dressings were applied and the patient was ultimately extubated transferred the PACU that incident. The end of the case all needle sponge counts were correct there were no adverse intraoperative events.

## 2017-07-02 NOTE — Anesthesia Procedure Notes (Signed)
Procedure Name: Intubation Date/Time: 07/02/2017 2:45 PM Performed by: Roderic Palau Pre-anesthesia Checklist: Patient identified Patient Re-evaluated:Patient Re-evaluated prior to induction Oxygen Delivery Method: Circle system utilized Preoxygenation: Pre-oxygenation with 100% oxygen Induction Type: IV induction Ventilation: Mask ventilation without difficulty Laryngoscope Size: Mac and 3 Grade View: Grade I Tube type: Oral Tube size: 7.0 mm Number of attempts: 1 Airway Equipment and Method: Stylet Placement Confirmation: ETT inserted through vocal cords under direct vision,  positive ETCO2 and breath sounds checked- equal and bilateral Secured at: 21 cm Tube secured with: Tape Dental Injury: Teeth and Oropharynx as per pre-operative assessment

## 2017-07-03 ENCOUNTER — Encounter (HOSPITAL_COMMUNITY): Payer: Self-pay | Admitting: Orthopedic Surgery

## 2017-07-03 DIAGNOSIS — E039 Hypothyroidism, unspecified: Secondary | ICD-10-CM | POA: Diagnosis not present

## 2017-07-03 DIAGNOSIS — K219 Gastro-esophageal reflux disease without esophagitis: Secondary | ICD-10-CM | POA: Diagnosis not present

## 2017-07-03 DIAGNOSIS — I252 Old myocardial infarction: Secondary | ICD-10-CM | POA: Diagnosis not present

## 2017-07-03 DIAGNOSIS — M797 Fibromyalgia: Secondary | ICD-10-CM | POA: Diagnosis not present

## 2017-07-03 DIAGNOSIS — J45909 Unspecified asthma, uncomplicated: Secondary | ICD-10-CM | POA: Diagnosis not present

## 2017-07-03 DIAGNOSIS — M810 Age-related osteoporosis without current pathological fracture: Secondary | ICD-10-CM | POA: Diagnosis not present

## 2017-07-03 DIAGNOSIS — Z882 Allergy status to sulfonamides status: Secondary | ICD-10-CM | POA: Diagnosis not present

## 2017-07-03 DIAGNOSIS — Z955 Presence of coronary angioplasty implant and graft: Secondary | ICD-10-CM | POA: Diagnosis not present

## 2017-07-03 DIAGNOSIS — I2510 Atherosclerotic heart disease of native coronary artery without angina pectoris: Secondary | ICD-10-CM | POA: Diagnosis not present

## 2017-07-03 DIAGNOSIS — Z880 Allergy status to penicillin: Secondary | ICD-10-CM | POA: Diagnosis not present

## 2017-07-03 DIAGNOSIS — M47816 Spondylosis without myelopathy or radiculopathy, lumbar region: Secondary | ICD-10-CM | POA: Diagnosis not present

## 2017-07-03 DIAGNOSIS — I10 Essential (primary) hypertension: Secondary | ICD-10-CM | POA: Diagnosis not present

## 2017-07-03 DIAGNOSIS — Z79899 Other long term (current) drug therapy: Secondary | ICD-10-CM | POA: Diagnosis not present

## 2017-07-03 DIAGNOSIS — M19011 Primary osteoarthritis, right shoulder: Secondary | ICD-10-CM | POA: Diagnosis not present

## 2017-07-03 DIAGNOSIS — Z7901 Long term (current) use of anticoagulants: Secondary | ICD-10-CM | POA: Diagnosis not present

## 2017-07-03 DIAGNOSIS — Z87891 Personal history of nicotine dependence: Secondary | ICD-10-CM | POA: Diagnosis not present

## 2017-07-03 DIAGNOSIS — Z886 Allergy status to analgesic agent status: Secondary | ICD-10-CM | POA: Diagnosis not present

## 2017-07-03 DIAGNOSIS — E7800 Pure hypercholesterolemia, unspecified: Secondary | ICD-10-CM | POA: Diagnosis not present

## 2017-07-03 DIAGNOSIS — G894 Chronic pain syndrome: Secondary | ICD-10-CM | POA: Diagnosis not present

## 2017-07-03 DIAGNOSIS — Z96651 Presence of right artificial knee joint: Secondary | ICD-10-CM | POA: Diagnosis not present

## 2017-07-03 DIAGNOSIS — M1712 Unilateral primary osteoarthritis, left knee: Secondary | ICD-10-CM | POA: Diagnosis not present

## 2017-07-03 DIAGNOSIS — M19049 Primary osteoarthritis, unspecified hand: Secondary | ICD-10-CM | POA: Diagnosis not present

## 2017-07-03 MED ORDER — HYDROCODONE-ACETAMINOPHEN 5-325 MG PO TABS: 1.0000 | ORAL_TABLET | ORAL | 0 refills | Status: DC | PRN

## 2017-07-03 MED ORDER — RIVAROXABAN 20 MG PO TABS: 20.0000 mg | ORAL_TABLET | Freq: Every day | ORAL | Status: DC

## 2017-07-03 MED ORDER — ONDANSETRON HCL 4 MG PO TABS: 4.0000 mg | ORAL_TABLET | Freq: Three times a day (TID) | ORAL | 0 refills | Status: DC | PRN

## 2017-07-03 MED ORDER — VANCOMYCIN HCL 10 G IV SOLR
1250.0000 mg | INTRAVENOUS | Status: DC
  Filled 2017-07-03: qty 1250

## 2017-07-03 MED ORDER — VANCOMYCIN HCL IN DEXTROSE 1-5 GM/200ML-% IV SOLN: 1000.0000 mg | Freq: Two times a day (BID) | INTRAVENOUS | Status: DC

## 2017-07-03 MED ORDER — HYDROCODONE-ACETAMINOPHEN 10-325 MG PO TABS: 1.0000 | ORAL_TABLET | ORAL | 0 refills | Status: DC | PRN

## 2017-07-03 MED ORDER — TAMSULOSIN HCL 0.4 MG PO CAPS: 0.4000 mg | ORAL_CAPSULE | Freq: Every day | ORAL | 0 refills | Status: DC

## 2017-07-03 MED ORDER — ALUM & MAG HYDROXIDE-SIMETH 200-200-20 MG/5ML PO SUSP
30.0000 mL | Freq: Four times a day (QID) | ORAL | Status: DC | PRN
  Administered 2017-07-03: 30 mL via ORAL
  Filled 2017-07-03: qty 30

## 2017-07-03 NOTE — Progress Notes (Addendum)
    Subjective: Procedure(s) (LRB): LUMBAR SPINAL CORD STIMULATOR INSERTION (N/A) 1 Day Post-Op  Patient reports pain as 2 on 0-10 scale.  Reports decreased leg pain reports incisional back pain   Negative void Negative bowel movement Positive flatus Negative chest pain or shortness of breath  Objective: Vital signs in last 24 hours: Temp:  [97.5 F (36.4 C)-98.5 F (36.9 C)] 98.4 F (36.9 C) (07/19 0746) Pulse Rate:  [54-75] 67 (07/19 0746) Resp:  [10-18] 16 (07/19 0746) BP: (84-180)/(45-95) 120/68 (07/19 0746) SpO2:  [91 %-100 %] 96 % (07/19 0746)  Intake/Output from previous day: 07/18 0701 - 07/19 0700 In: 1921.7 [P.O.:240; I.V.:1481.7; IV Piggyback:200] Out: 875 [Urine:850; Blood:25]  Labs: No results for input(s): WBC, RBC, HCT, PLT in the last 72 hours. No results for input(s): NA, K, CL, CO2, BUN, CREATININE, GLUCOSE, CALCIUM in the last 72 hours. No results for input(s): LABPT, INR in the last 72 hours.  Physical Exam: Neurologically intact ABD soft Intact pulses distally Incision: dressing C/D/I Compartment soft  Assessment/Plan: Patient stable  xrays n/a Continue mobilization with physical therapy Continue care  Advance diet Up with therapy  Doing well  Plan on d/c to home  Melina Schools, MD [place] [phone]

## 2017-07-03 NOTE — Progress Notes (Signed)
PHARMACY NOTE:  ANTIMICROBIAL RENAL DOSAGE ADJUSTMENT  Current antimicrobial regimen includes a mismatch between antimicrobial dosage and estimated renal function.  As per policy approved by the Pharmacy & Therapeutics and Medical Executive Committees, the antimicrobial dosage will be adjusted accordingly.  Current antimicrobial dosage:  Vancomycin 1000 mg IV q12h  Indication: surgical prophylaxis with drain in place  Renal Function:  Estimated Creatinine Clearance: 50.6 mL/min (by C-G formula based on SCr of 0.8 mg/dL). []      On intermittent HD, scheduled: []      On CRRT    Antimicrobial dosage has been changed to:  Vancomycin 1250 mg IV q24h  Additional comments: received vancomycin 1500 mg IV on 7/18 13:33 and 1000 mg IV on 7/19 03:41   Thank you for allowing pharmacy to be a part of this patient's care.  Renold Genta, PharmD, [place] Pharmacist Main pharmacy - [phone] 07/03/2017 11:53 AM

## 2017-07-03 NOTE — Evaluation (Signed)
Physical Therapy Evaluation Patient Details Name: [name] MRN: [id number] DOB: Oct 31, 1936 Today's Date: 07/03/2017   History of Present Illness  Pt is a 81 y/o female who presents s/p lumbar spinal cord stimulator insertion on 07/02/17.  Clinical Impression  Pt admitted with above diagnosis. Pt currently with functional limitations due to the deficits listed below (see PT Problem List). At the time of PT eval pt was able to perform transfers and ambulation with close guard for balance support and safety. Pt required increased cues for maintenance of precautions, and does not make corrective changes consistently with VC's. Pt will benefit from skilled PT to increase their independence and safety with mobility to allow discharge to the venue listed below.       Follow Up Recommendations Home health PT;Supervision for mobility/OOB    Equipment Recommendations  None recommended by PT    Recommendations for Other Services       Precautions / Restrictions Precautions Precautions: Fall;Back Precaution Booklet Issued: Yes (comment) Precaution Comments: Reviewed handout in detail and pt was cued for precautions during functional mobility.  Restrictions Weight Bearing Restrictions: No      Mobility  Bed Mobility Overal bed mobility: Needs Assistance Bed Mobility: Rolling;Sit to Sidelying Rolling: Supervision       Sit to sidelying: Min assist General bed mobility comments: Assist for LE elevation up into bed. Bed height raised to simulate home environment. Pt states she has a box spring that she can put her feet on to assist in scooting back.  Transfers Overall transfer level: Needs assistance Equipment used: Rolling walker (2 wheeled) Transfers: Sit to/from Stand Sit to Stand: Min guard         General transfer comment: Hands-on guarding as pt powered-up to full standing position. Increased time required as pt painful with transitions.   Ambulation/Gait Ambulation/Gait  assistance: Min guard;Supervision Ambulation Distance (Feet): 150 Feet Assistive device: Rolling walker (2 wheeled) Gait Pattern/deviations: Step-through pattern;Decreased stride length;Trunk flexed Gait velocity: Decreased Gait velocity interpretation: Below normal speed for age/gender General Gait Details: VC's for improved posture and general safety with walker positioning. Pt could walk and talk when discussing casual topics or if she was exchanges social greetings in the hall. Could not walk and talk however when therapist was asking about precautions and PLOF.   Stairs            Wheelchair Mobility    Modified Rankin (Stroke Patients Only)       Balance Overall balance assessment: Needs assistance Sitting-balance support: Feet supported;No upper extremity supported Sitting balance-Leahy Scale: Fair     Standing balance support: No upper extremity supported;During functional activity Standing balance-Leahy Scale: Poor Standing balance comment: Reaching for external support when she did not have the walker in front of her                             Pertinent Vitals/Pain Pain Assessment: Faces Faces Pain Scale: Hurts little more Pain Location: Incision site Pain Descriptors / Indicators: Operative site guarding;Discomfort Pain Intervention(s): Limited activity within patient's tolerance;Monitored during session;Repositioned    Home Living Family/patient expects to be discharged to:: Private residence (Senior apartments) Living Arrangements: Alone Available Help at Discharge: Personal care attendant;Available PRN/intermittently Type of Home: Apartment Home Access: Ramped entrance;Level entry     Home Layout: One level Home Equipment: [place] - 2 wheels;Walker - 4 wheels;Tub bench;Toilet riser;Cane - single point;Grab bars - toilet;Grab bars - tub/shower  Prior Function Level of Independence: Needs assistance   Gait / Transfers Assistance  Needed: SPC or RW depending on the day/where she was going to be  ADL's / Homemaking Assistance Needed: Assist for household chores        Hand Dominance   Dominant Hand: Right    Extremity/Trunk Assessment   Upper Extremity Assessment Upper Extremity Assessment: Defer to OT evaluation    Lower Extremity Assessment Lower Extremity Assessment: Generalized weakness;RLE deficits/detail RLE Deficits / Details: Per pt several surgeries on R foot in the past.    Cervical / Trunk Assessment Cervical / Trunk Assessment: Kyphotic;Other exceptions Cervical / Trunk Exceptions: s/p surgery  Communication   Communication: No difficulties  Cognition Arousal/Alertness: Awake/alert Behavior During Therapy: WFL for tasks assessed/performed Overall Cognitive Status: No family/caregiver present to determine baseline cognitive functioning                                 General Comments: Pt reports she is having difficulty remembering things "this morning". Often disguising teh fact that she does not remember or know what therapist is asking, with humor      General Comments      Exercises     Assessment/Plan    PT Assessment Patient needs continued PT services  PT Problem List Decreased strength;Decreased range of motion;Decreased activity tolerance;Decreased balance;Decreased mobility;Decreased knowledge of use of DME;Decreased safety awareness;Decreased knowledge of precautions;Pain       PT Treatment Interventions DME instruction;Gait training;Stair training;Functional mobility training;Therapeutic activities;Therapeutic exercise;Neuromuscular re-education;Patient/family education    PT Goals (Current goals can be found in the Care Plan section)  Acute Rehab PT Goals Patient Stated Goal: Home today PT Goal Formulation: With patient Time For Goal Achievement: 07/10/17 Potential to Achieve Goals: Good    Frequency Min 5X/week   Barriers to discharge         Co-evaluation               AM-PAC PT "6 Clicks" Daily Activity  Outcome Measure Difficulty turning over in bed (including adjusting bedclothes, sheets and blankets)?: None Difficulty moving from lying on back to sitting on the side of the bed? : A Little Difficulty sitting down on and standing up from a chair with arms (e.g., wheelchair, bedside commode, etc,.)?: A Little Help needed moving to and from a bed to chair (including a wheelchair)?: A Little Help needed walking in hospital room?: A Little Help needed climbing 3-5 steps with a railing? : A Lot 6 Click Score: 18    End of Session Equipment Utilized During Treatment: Gait belt Activity Tolerance: Patient tolerated treatment well Patient left: in bed;with call bell/phone within reach Nurse Communication: Mobility status PT Visit Diagnosis: Unsteadiness on feet (R26.81);Pain Pain - part of body:  (back)    Time: 2355-7322 PT Time Calculation (min) (ACUTE ONLY): 23 min   Charges:   PT Evaluation $PT Eval Moderate Complexity: 1 Procedure PT Treatments $Gait Training: 8-22 mins   PT G Codes:   PT G-Codes **NOT FOR INPATIENT CLASS** Functional Assessment Tool Used: Clinical judgement Functional Limitation: Mobility: Walking and moving around Mobility: Walking and Moving Around Current Status (G2542): At least 20 percent but less than 40 percent impaired, limited or restricted Mobility: Walking and Moving Around Goal Status [phone]): At least 1 percent but less than 20 percent impaired, limited or restricted    Rolinda Roan, PT, DPT Acute Rehabilitation Services Pager: [phone]  Thelma Comp 07/03/2017, 11:58 AM

## 2017-07-03 NOTE — Progress Notes (Signed)
Patient alert and oriented, mae's well, voiding adequate amount of urine, swallowing without difficulty, c/o mild pain at time of discharge. Patient discharged home with family. Script and discharged instructions given to patient. Patient and family stated understanding of instructions given. Patient has an appointment with Dr. Brooks    

## 2017-07-03 NOTE — Care Management Note (Signed)
Case Management Note  Patient Details  Name: [name] MRN: [id number] Date of Birth: [birth year]  Subjective/Objective:  81 yr old female  S/p spinal cord stimulator placement.                 Action/Plan: Case manager spoke with patient concerning discharge plan. Choice for Home Health agency was offered. Referral was called to Stevie Kern, [place] Liaison. Patient says she has RW, Wheelchair and a cane. Will have support at discharge.     Expected Discharge Date:    07/03/17              Expected Discharge Plan:  [place]  In-House Referral:  NA  Discharge planning Services  CM Consult  Post Acute Care Choice:  Home Health Choice offered to:  Patient  DME Arranged:  N/A DME Agency:  NA  HH Arranged:  PT, OT HH Agency:  [place]  Status of Service:  Completed, signed off  If discussed at [place] of Stay Meetings, dates discussed:    Additional Comments:  Ninfa Meeker, RN 07/03/2017, 10:50 AM

## 2017-07-03 NOTE — Progress Notes (Signed)
Orthopedic Tech Progress Note Patient Details:  [name] 02/23/1936 [id number]  Ortho Devices Ortho Device/Splint Location: Floor Nurse of unit 3C stated pt does not need a Lumber brace.   Kristopher Oppenheim 07/03/2017, 11:03 AM

## 2017-07-03 NOTE — Care Management Obs Status (Signed)
[place] NOTIFICATION   Patient Details  Name: [name] MRN: [id number] Date of Birth: 1936/06/12   Medicare Observation Status Notification Given:  Yes    Ninfa Meeker, RN 07/03/2017, 10:20 AM

## 2017-07-03 NOTE — Progress Notes (Signed)
Occupational Therapy Evaluation Patient Details Name: [name] MRN: [id number] DOB: July 02, 1936 Today's Date: 07/03/2017    History of Present Illness Pt is a 81 y/o female who presents s/p lumbar spinal cord stimulator insertion on 07/02/17.   Clinical Impression   Completed education regarding compensatory strategies for ADL using AE and DME. Pt making good progress but feel she can benefit from [facility] to facilitate return to PLOF and reduce risk of falls and readmission. All further Ot to be addressed by [place].     Follow Up Recommendations  Home health OT;Supervision - Intermittent    Equipment Recommendations  None recommended by OT    Recommendations for Other Services       Precautions / Restrictions Precautions Precautions: Fall;Back Precaution Booklet Issued: Yes (comment) Precaution Comments: Reviewed handout in detail and pt was cued for precautions during functional mobility.  Restrictions Weight Bearing Restrictions: No      Mobility Bed Mobility Overal bed mobility: Needs Assistance Bed Mobility: Rolling;Sit to Sidelying Rolling: Supervision       Sit to sidelying: Min assist General bed mobility comments: OOB in chair  Transfers Overall transfer level: Needs assistance Equipment used: Rolling walker (2 wheeled) Transfers: Sit to/from Stand Sit to Stand: Min guard         General transfer comment: Hands-on guarding as pt powered-up to full standing position. Increased time required as pt painful with transitions.     Balance Overall balance assessment: Needs assistance;History of Falls Sitting-balance support: Feet supported;No upper extremity supported Sitting balance-Leahy Scale: Fair     Standing balance support: No upper extremity supported;During functional activity Standing balance-Leahy Scale: Poor Standing balance comment: Reaching for external support when she did not have the walker in front of her                            ADL either performed or assessed with clinical judgement   ADL Overall ADL's : Needs assistance/impaired                                     Functional mobility during ADLs: Supervision/safety;Rolling walker;Cueing for safety General ADL Comments: educated pt on home safety, reducing risk of falls and compensatory technqieus for ADL and functional mobility using AE and DME. Pt states she has had several falls.     Vision Baseline Vision/History: Wears glasses       Perception     Praxis      Pertinent Vitals/Pain Pain Assessment: 0-10 Pain Score: 4  Faces Pain Scale: Hurts little more Pain Location: Incision site Pain Descriptors / Indicators: Operative site guarding;Discomfort Pain Intervention(s): Limited activity within patient's tolerance     Hand Dominance Right   Extremity/Trunk Assessment Upper Extremity Assessment Upper Extremity Assessment: Generalized weakness   Lower Extremity Assessment Lower Extremity Assessment: Defer to PT evaluation RLE Deficits / Details: Per pt several surgeries on R foot in the past.   Cervical / Trunk Assessment Cervical / Trunk Assessment: Kyphotic;Other exceptions Cervical / Trunk Exceptions: s/p surgery   Communication Communication Communication: No difficulties   Cognition Arousal/Alertness: Awake/alert Behavior During Therapy: WFL for tasks assessed/performed Overall Cognitive Status: No family/caregiver present to determine baseline cognitive functioning  General Comments: Pt reports she is having difficulty remembering things "this morning". Often disguising teh fact that she does not remember or know what therapist is asking, with humor   General Comments       Exercises     Shoulder Instructions      Home Living Family/patient expects to be discharged to:: Private residence (Senior apartments) Living Arrangements: Alone Available Help at  Discharge: Personal care attendant;Available PRN/intermittently Type of Home: Apartment Home Access: Ramped entrance;Level entry     Home Layout: One level     Bathroom Shower/Tub: Teacher, early years/pre: Handicapped height Bathroom Accessibility: Yes How Accessible: Accessible via walker Home Equipment: [place] - 2 wheels;Walker - 4 wheels;Tub bench;Toilet riser;Cane - single point;Grab bars - toilet;Grab bars - tub/shower          Prior Functioning/Environment Level of Independence: Needs assistance  Gait / Transfers Assistance Needed: SPC or RW depending on the day/where she was going to be ADL's / Homemaking Assistance Needed: Assist for household chores            OT Problem List: Decreased range of motion;Decreased activity tolerance;Decreased safety awareness;Decreased knowledge of use of DME or AE;Obesity;Pain      OT Treatment/Interventions:      OT Goals(Current goals can be found in the care plan section) Acute Rehab OT Goals Patient Stated Goal: Home today OT Goal Formulation: All assessment and education complete, DC therapy  OT Frequency:     Barriers to D/C:            Co-evaluation              AM-PAC PT "6 Clicks" Daily Activity     Outcome Measure Help from another person eating meals?: None Help from another person taking care of personal grooming?: None Help from another person toileting, which includes using toliet, bedpan, or urinal?: None Help from another person bathing (including washing, rinsing, drying)?: A Little Help from another person to put on and taking off regular upper body clothing?: A Little Help from another person to put on and taking off regular lower body clothing?: A Little 6 Click Score: 21   End of Session Equipment Utilized During Treatment: Gait belt;Rolling walker Nurse Communication: Mobility status  Activity Tolerance: Patient tolerated treatment well Patient left: Other (comment) (wiht  PT)  OT Visit Diagnosis: Unsteadiness on feet (R26.81);Pain;Muscle weakness (generalized) (M62.81) Pain - part of body:  (back)                Time: 3545-6256 OT Time Calculation (min): 17 min Charges:  OT General Charges $OT Visit: 1 Procedure OT Evaluation $OT Eval Low Complexity: 1 Procedure G-Codes: OT G-codes **NOT FOR INPATIENT CLASS** Functional Assessment Tool Used: Clinical judgement Functional Limitation: Self care Self Care Current Status (L8937): At least 1 percent but less than 20 percent impaired, limited or restricted Self Care Goal Status (D4287): At least 1 percent but less than 20 percent impaired, limited or restricted Self Care Discharge Status [phone]): At least 1 percent but less than 20 percent impaired, limited or restricted   [facility], OT/L  [phone] 07/03/2017  Ruthmary Occhipinti,[name] 07/03/2017, 2:31 PM

## 2017-07-03 NOTE — Care Management CC44 (Signed)
Condition Code 44 Documentation Completed  Patient Details  Name: [name] MRN: [id number] Date of Birth: 11-Jun-1936   Condition Code 44 given:  Yes Patient signature on Condition Code 44 notice:  Yes Documentation of 2 MD's agreement:  Yes Code 44 added to claim:  Yes    Ninfa Meeker, RN 07/03/2017, 10:20 AM

## 2017-07-04 ENCOUNTER — Other Ambulatory Visit: Payer: Self-pay | Admitting: Family Medicine

## 2017-07-04 ENCOUNTER — Encounter (HOSPITAL_COMMUNITY): Payer: Self-pay | Admitting: Orthopedic Surgery

## 2017-07-04 DIAGNOSIS — I2510 Atherosclerotic heart disease of native coronary artery without angina pectoris: Secondary | ICD-10-CM | POA: Diagnosis not present

## 2017-07-04 DIAGNOSIS — Z7901 Long term (current) use of anticoagulants: Secondary | ICD-10-CM | POA: Diagnosis not present

## 2017-07-04 DIAGNOSIS — J42 Unspecified chronic bronchitis: Secondary | ICD-10-CM | POA: Diagnosis not present

## 2017-07-04 DIAGNOSIS — Z79891 Long term (current) use of opiate analgesic: Secondary | ICD-10-CM | POA: Diagnosis not present

## 2017-07-04 DIAGNOSIS — Z7951 Long term (current) use of inhaled steroids: Secondary | ICD-10-CM | POA: Diagnosis not present

## 2017-07-04 DIAGNOSIS — G894 Chronic pain syndrome: Secondary | ICD-10-CM | POA: Diagnosis not present

## 2017-07-04 DIAGNOSIS — Z4789 Encounter for other orthopedic aftercare: Secondary | ICD-10-CM | POA: Diagnosis not present

## 2017-07-04 DIAGNOSIS — J45909 Unspecified asthma, uncomplicated: Secondary | ICD-10-CM | POA: Diagnosis not present

## 2017-07-04 DIAGNOSIS — E039 Hypothyroidism, unspecified: Secondary | ICD-10-CM | POA: Diagnosis not present

## 2017-07-08 DIAGNOSIS — Z7951 Long term (current) use of inhaled steroids: Secondary | ICD-10-CM | POA: Diagnosis not present

## 2017-07-08 DIAGNOSIS — Z79891 Long term (current) use of opiate analgesic: Secondary | ICD-10-CM | POA: Diagnosis not present

## 2017-07-08 DIAGNOSIS — J42 Unspecified chronic bronchitis: Secondary | ICD-10-CM | POA: Diagnosis not present

## 2017-07-08 DIAGNOSIS — J45909 Unspecified asthma, uncomplicated: Secondary | ICD-10-CM | POA: Diagnosis not present

## 2017-07-08 DIAGNOSIS — I2510 Atherosclerotic heart disease of native coronary artery without angina pectoris: Secondary | ICD-10-CM | POA: Diagnosis not present

## 2017-07-08 DIAGNOSIS — Z7901 Long term (current) use of anticoagulants: Secondary | ICD-10-CM | POA: Diagnosis not present

## 2017-07-08 DIAGNOSIS — Z4789 Encounter for other orthopedic aftercare: Secondary | ICD-10-CM | POA: Diagnosis not present

## 2017-07-08 DIAGNOSIS — G894 Chronic pain syndrome: Secondary | ICD-10-CM | POA: Diagnosis not present

## 2017-07-08 DIAGNOSIS — E039 Hypothyroidism, unspecified: Secondary | ICD-10-CM | POA: Diagnosis not present

## 2017-07-10 DIAGNOSIS — E039 Hypothyroidism, unspecified: Secondary | ICD-10-CM | POA: Diagnosis not present

## 2017-07-10 DIAGNOSIS — G894 Chronic pain syndrome: Secondary | ICD-10-CM | POA: Diagnosis not present

## 2017-07-10 DIAGNOSIS — Z7901 Long term (current) use of anticoagulants: Secondary | ICD-10-CM | POA: Diagnosis not present

## 2017-07-10 DIAGNOSIS — J45909 Unspecified asthma, uncomplicated: Secondary | ICD-10-CM | POA: Diagnosis not present

## 2017-07-10 DIAGNOSIS — J42 Unspecified chronic bronchitis: Secondary | ICD-10-CM | POA: Diagnosis not present

## 2017-07-10 DIAGNOSIS — Z7951 Long term (current) use of inhaled steroids: Secondary | ICD-10-CM | POA: Diagnosis not present

## 2017-07-10 DIAGNOSIS — Z79891 Long term (current) use of opiate analgesic: Secondary | ICD-10-CM | POA: Diagnosis not present

## 2017-07-10 DIAGNOSIS — Z4789 Encounter for other orthopedic aftercare: Secondary | ICD-10-CM | POA: Diagnosis not present

## 2017-07-10 DIAGNOSIS — I2510 Atherosclerotic heart disease of native coronary artery without angina pectoris: Secondary | ICD-10-CM | POA: Diagnosis not present

## 2017-07-10 NOTE — Discharge Summary (Signed)
Physician Discharge Summary  Patient ID: [name] MRN: [id number] DOB/AGE: 04-23-1936 81 y.o.  Admit date: 07/02/2017 Discharge date: 07/03/17  Admission Diagnoses:  Chronic pain Syndrome  Discharge Diagnoses:  Active Problems:   Chronic pain   Past Medical History:  Diagnosis Date  . Anxiety   . Anxiety disorder   . Arthritis   . Asthmatic bronchitis   . Atrial fibrillation ([place])   . Atrial fibrillation ([place])   . Bowel obstruction (HCC)    blockage  . CAD (coronary artery disease)    Stent to RI 2003.  Myoview 2013 no ischemia.  . Cataract   . Chronic back pain   . Chronic bronchitis ([place])   . Chronic pain syndrome   . Colon polyp    adenomatous  . Congestive heart disease (HCC)    Preserved EF  . Depression   . Dysrhythmia   . Encephalitis    d/t meningitis  . Esophageal motility disorder   . Fibromyalgia   . GERD (gastroesophageal reflux disease)   . History of hiatal hernia   . History of kidney stones   . Hyperlipidemia   . Hypertension   . Hypothyroid   . 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 Myocardial infarction ([place])    2000  . Nephrolithiasis   . OA (osteoarthritis)   . Pleural effusion on right   . Pneumonia   . PONV (postoperative nausea and vomiting)   . Status post dilation of esophageal narrowing   . Vitamin D deficiency     Surgeries: Procedure(s): LUMBAR SPINAL CORD STIMULATOR INSERTION on 07/02/2017   Consultants (if any):   Discharged Condition: Improved  Hospital Course: [name] is an 81 y.o. female who was admitted 07/02/2017 with a diagnosis of Chronic Pain Syndrome and went to the operating room on 07/02/2017 and underwent the above named procedures. Post op day 1 pt reports a low level of pain controlled on oral medications.   Pt is ambulating in hallway.  Pt is cleared by PT for DC.  Pt meets with [facility] rep before DC.   She was given perioperative antibiotics:  Anti-infectives    Start      Dose/Rate Route Frequency Ordered Stop   07/04/17 0000  vancomycin (VANCOCIN) 1,250 mg in sodium chloride 0.9 % 250 mL IVPB  Status:  Discontinued     1,250 mg 166.7 mL/hr over 90 Minutes Intravenous Every 24 hours 07/03/17 1153 07/03/17 2052   07/03/17 1200  vancomycin (VANCOCIN) IVPB 1000 mg/200 mL premix  Status:  Discontinued     1,000 mg 200 mL/hr over 60 Minutes Intravenous Every 12 hours 07/03/17 1147 07/03/17 1151   07/03/17 0400  vancomycin (VANCOCIN) IVPB 1000 mg/200 mL premix     1,000 mg 200 mL/hr over 60 Minutes Intravenous  Once 07/02/17 2108 07/03/17 0441   07/02/17 1144  vancomycin (VANCOCIN) 1,500 mg in sodium chloride 0.9 % 500 mL IVPB     1,500 mg 250 mL/hr over 120 Minutes Intravenous 120 min pre-op 07/02/17 1144 07/02/17 1333    .  She was given sequential compression devices, early ambulation, and TED for DVT prophylaxis.  She benefited maximally from the hospital stay and there were no complications.    Recent vital signs:  Vitals:   07/03/17 0404 07/03/17 0746  BP: 123/74 120/68  Pulse: 75 67  Resp: 16 16  Temp: 98.5 F (36.9 C) 98.4 F (36.9 C)    Recent  laboratory studies:  Lab Results  Component Value Date   HGB 13.3 06/26/2017   HGB 12.6 03/11/2017   HGB 13.0 08/28/2016   Lab Results  Component Value Date   WBC 6.8 06/26/2017   PLT 256 06/26/2017   Lab Results  Component Value Date   INR 1.12 05/02/2014   Lab Results  Component Value Date   NA 135 06/26/2017   K 4.7 06/26/2017   CL 101 06/26/2017   CO2 25 06/26/2017   BUN 12 06/26/2017   CREATININE 0.80 06/26/2017   GLUCOSE 91 06/26/2017    Discharge Medications:   Allergies as of 07/03/2017      Reactions   Naproxen Other (See Comments)   Tongue swelling   Penicillins Swelling   Swelling around site Has patient had a PCN reaction causing immediate rash, facial/tongue/throat swelling, SOB or lightheadedness with hypotension: Yes Has patient had a PCN reaction causing severe  rash involving mucus membranes or skin necrosis: No Has patient had a PCN reaction that required hospitalization: No Has patient had a PCN reaction occurring within the last 10 years: No If all of the above answers are "NO", then may proceed with Cephalosporin use.   Sulfonamide Derivatives Hives   Aspirin Other (See Comments)   REACTION: regular strength causes "heart to beat fast"   Captopril Hypertension   Clindamycin/lincomycin Other (See Comments)   unknown      Medication List    STOP taking these medications   HYDROcodone-acetaminophen 5-325 MG tablet Commonly known as:  NORCO Replaced by:  HYDROcodone-acetaminophen 10-325 MG tablet     TAKE these medications   acetaminophen 650 MG CR tablet Commonly known as:  TYLENOL Take 650-1,300 mg by mouth every 8 (eight) hours as needed for pain. Depends on pain level if takes 1-2 tablets   ALPRAZolam 0.5 MG tablet Commonly known as:  XANAX TAKE 2 TABLETS AT BEDTIME   ANALGESIC EX Apply 1 application topically 3 (three) times daily as needed (pain).   beta carotene 25000 UNIT capsule Take 25,000 Units by mouth daily.   budesonide 0.5 MG/2ML nebulizer solution Commonly known as:  PULMICORT Take 2 mLs (0.5 mg total) by nebulization 2 (two) times daily. Dx 496 What changed:  when to take this  reasons to take this  additional instructions   CALCIUM 600+D 600-400 MG-UNIT tablet Generic drug:  Calcium Carbonate-Vitamin D Take 1 tablet by mouth daily at 6 PM.   cholecalciferol 1000 units tablet Commonly known as:  VITAMIN D Take 1,000 Units by mouth 2 (two) times daily with breakfast and lunch.   CoQ10 100 MG Caps Take 100 mg by mouth daily.   denosumab 60 MG/ML Soln injection Commonly known as:  PROLIA Inject 60 mg into the skin every 6 (six) months. Administer in upper arm, thigh, or abdomen What changed:  additional instructions   dicyclomine 10 MG capsule Commonly known as:  BENTYL TAKE (1) OR (2) CAPSULES  EVERY SIX HOURS AS NEEDED FOR SPASM   diltiazem 120 MG 24 hr capsule Commonly known as:  CARDIZEM CD TAKE (1) CAPSULE DAILY What changed:  how much to take  how to take this  when to take this  additional instructions   fluticasone 50 MCG/ACT nasal spray Commonly known as:  FLONASE 2 SPRAYS IN EACH NOSTRIL ONCE A DAY What changed:  See the new instructions.   HYDROcodone-acetaminophen 10-325 MG tablet Commonly known as:  NORCO Take 1 tablet by mouth every 4 (four) hours as needed.  Replaces:  HYDROcodone-acetaminophen 5-325 MG tablet   HYDROcodone-acetaminophen 5-325 MG tablet Commonly known as:  NORCO Take 1 tablet by mouth every 4 (four) hours as needed.   levothyroxine 25 MCG tablet Commonly known as:  SYNTHROID, LEVOTHROID TAKE 2 TABLETS DAILY AS DIRECTED What changed:  how much to take  how to take this  when to take this  additional instructions   loratadine 10 MG tablet Commonly known as:  CLARITIN Take 1 tablet (10 mg total) by mouth daily.   meclizine 25 MG tablet Commonly known as:  ANTIVERT TAKE (1) TABLET THREE TIMES DAILY AS NEEDED FOR DIZZINESS.   Melatonin 5 MG Tabs Take 10 mg by mouth every evening.   Melatonin 3 MG Tabs Take 1-2 tablets (3-6 mg total) by mouth at bedtime as needed.   metoprolol tartrate 25 MG tablet Commonly known as:  LOPRESSOR Take 1 tablet (25 mg total) by mouth 2 (two) times daily. 2 tablets each morning and 1 tablet each evening What changed:  how much to take  additional instructions   montelukast 10 MG tablet Commonly known as:  SINGULAIR Take 1 tablet (10 mg total) by mouth daily. What changed:  when to take this   MUCINEX PO Take 1 tablet by mouth 2 (two) times daily.   nitroGLYCERIN 0.4 MG SL tablet Commonly known as:  NITROSTAT 1 tablet under the tongue every 5 minutes as needed for chest pain.   omega-3 acid ethyl esters 1 g capsule Commonly known as:  LOVAZA TAKE (2) CAPSULES TWICE  DAILY. What changed:  how much to take  how to take this  when to take this  additional instructions   omeprazole-sodium bicarbonate 40-1100 MG capsule Commonly known as:  ZEGERID TAKE (1) CAPSULE DAILY   ondansetron 4 MG tablet Commonly known as:  ZOFRAN TAKE 1 TABLET EVERY 12 HOURS AS NEEDED FOR NAUSEA What changed:  Another medication with the same name was added. Make sure you understand how and when to take each.   ondansetron 4 MG tablet Commonly known as:  ZOFRAN Take 1 tablet (4 mg total) by mouth every 8 (eight) hours as needed for nausea or vomiting. What changed:  You were already taking a medication with the same name, and this prescription was added. Make sure you understand how and when to take each.   ondansetron 4 MG tablet Commonly known as:  ZOFRAN Take 1 tablet (4 mg total) by mouth every 8 (eight) hours as needed for nausea or vomiting. What changed:  You were already taking a medication with the same name, and this prescription was added. Make sure you understand how and when to take each.   PROAIR HFA 108 (90 Base) MCG/ACT inhaler Generic drug:  albuterol USE 2 PUFFS EVERY 6 HOURS AS NEEDED FOR WHEEZING   rivaroxaban 20 MG Tabs tablet Commonly known as:  XARELTO Take 1 tablet (20 mg total) by mouth daily. What changed:  when to take this   rosuvastatin 20 MG tablet Commonly known as:  CRESTOR Take 1 tablet (20 mg total) by mouth daily. What changed:  when to take this   sertraline 100 MG tablet Commonly known as:  ZOLOFT Take 1 tablet (100 mg total) by mouth daily. What changed:  when to take this   SYSTANE OP Apply 1 drop to eye daily as needed (dry eyes).   tamsulosin 0.4 MG Caps capsule Commonly known as:  FLOMAX Take 1 capsule (0.4 mg total) by mouth daily after breakfast.  vitamin C 1000 MG tablet Take 1,000 mg by mouth daily.       Diagnostic Studies: Dg Thoracic Spine 2 View  Result Date: 07/03/2017 CLINICAL DATA:  Thoracic  spinal stimulator placement. EXAM: THORACIC SPINE 2 VIEWS COMPARISON:  02/11/2017 CXR FINDINGS: 21 seconds of fluoroscopic time was utilized. Radiation safety time-out was performed as well as LMP verified prior to exam. Images centered over the mid to lower thoracic spine demonstrate a neural stimulator device with lead projecting up to the T9 level. Overlying probe possibly a pH probe is also seen extending down to the T10 level. IMPRESSION: Neural stimulator lead placed up to the T9 level. Electronically Signed   By: Ashley Royalty M.D.   On: 07/03/2017 03:20   Dg C-arm 1-60 Min-no Report  Result Date: 07/02/2017 Fluoroscopy was utilized by the requesting physician.  No radiographic interpretation.    Disposition: 01-Home or Self Care Pt will present in 2 weeks Post op medications provided  Discharge Instructions    Incentive spirometry RT    Complete by:  As directed       Follow-up Information    [facility].   Why:  A representative from [facility] will contact you to arrange start date and time for your therapy. Contact information: [place] [address] [phone]        Melina Schools, MD Follow up.   Specialty:  Orthopedic Surgery Contact information: [address] [place] [address] [phone]            Signed: Valinda Hoar 07/10/2017, 10:02 AM

## 2017-07-14 ENCOUNTER — Encounter (HOSPITAL_COMMUNITY): Payer: Self-pay | Admitting: Orthopedic Surgery

## 2017-07-15 DIAGNOSIS — Z7901 Long term (current) use of anticoagulants: Secondary | ICD-10-CM | POA: Diagnosis not present

## 2017-07-15 DIAGNOSIS — Z7951 Long term (current) use of inhaled steroids: Secondary | ICD-10-CM | POA: Diagnosis not present

## 2017-07-15 DIAGNOSIS — Z4789 Encounter for other orthopedic aftercare: Secondary | ICD-10-CM | POA: Diagnosis not present

## 2017-07-15 DIAGNOSIS — J45909 Unspecified asthma, uncomplicated: Secondary | ICD-10-CM | POA: Diagnosis not present

## 2017-07-15 DIAGNOSIS — E039 Hypothyroidism, unspecified: Secondary | ICD-10-CM | POA: Diagnosis not present

## 2017-07-15 DIAGNOSIS — J42 Unspecified chronic bronchitis: Secondary | ICD-10-CM | POA: Diagnosis not present

## 2017-07-15 DIAGNOSIS — I2510 Atherosclerotic heart disease of native coronary artery without angina pectoris: Secondary | ICD-10-CM | POA: Diagnosis not present

## 2017-07-15 DIAGNOSIS — G894 Chronic pain syndrome: Secondary | ICD-10-CM | POA: Diagnosis not present

## 2017-07-15 DIAGNOSIS — Z79891 Long term (current) use of opiate analgesic: Secondary | ICD-10-CM | POA: Diagnosis not present

## 2017-07-17 ENCOUNTER — Telehealth: Payer: Self-pay | Admitting: Pulmonary Disease

## 2017-07-17 DIAGNOSIS — Z4789 Encounter for other orthopedic aftercare: Secondary | ICD-10-CM | POA: Diagnosis not present

## 2017-07-17 DIAGNOSIS — J42 Unspecified chronic bronchitis: Secondary | ICD-10-CM | POA: Diagnosis not present

## 2017-07-17 DIAGNOSIS — E039 Hypothyroidism, unspecified: Secondary | ICD-10-CM | POA: Diagnosis not present

## 2017-07-17 DIAGNOSIS — I2510 Atherosclerotic heart disease of native coronary artery without angina pectoris: Secondary | ICD-10-CM | POA: Diagnosis not present

## 2017-07-17 DIAGNOSIS — Z7901 Long term (current) use of anticoagulants: Secondary | ICD-10-CM | POA: Diagnosis not present

## 2017-07-17 DIAGNOSIS — G894 Chronic pain syndrome: Secondary | ICD-10-CM | POA: Diagnosis not present

## 2017-07-17 DIAGNOSIS — Z7951 Long term (current) use of inhaled steroids: Secondary | ICD-10-CM | POA: Diagnosis not present

## 2017-07-17 DIAGNOSIS — Z79891 Long term (current) use of opiate analgesic: Secondary | ICD-10-CM | POA: Diagnosis not present

## 2017-07-17 DIAGNOSIS — J45909 Unspecified asthma, uncomplicated: Secondary | ICD-10-CM | POA: Diagnosis not present

## 2017-07-17 NOTE — Telephone Encounter (Signed)
Called and spoke with pt. She states that she is feeling fine and is not having any issues with respiratory symptoms. Pt reports that she did speak with someone that was wanting to get her set up to be tested for sleep apnea. She could not tell me who it was that she spoke to and what company they were with. Before hanging up with the pt I asked her again if she was having SOB, chest tightness, wheezing or coughing. She denied all of these symptoms.  lmtcb x1 for [name] at [facility].

## 2017-07-18 DIAGNOSIS — M5136 Other intervertebral disc degeneration, lumbar region: Secondary | ICD-10-CM | POA: Diagnosis not present

## 2017-07-18 NOTE — Telephone Encounter (Signed)
Attempted to call Marla at [facility] and no one was available.  Will call back .

## 2017-07-21 NOTE — Telephone Encounter (Signed)
lmtcb x2 for [name] with [facility]

## 2017-07-22 NOTE — Telephone Encounter (Signed)
LM for [name] with [facility]

## 2017-07-22 NOTE — Telephone Encounter (Signed)
LM x 1 for [facility]

## 2017-07-22 NOTE — Telephone Encounter (Signed)
I have spoken with [name] with [facility]. [name] feels that an ONO may be beneficial to pt. [name] states pt reports of daytime fatigue and having to use sleep prop to breathe better.  RA please advise. Thanks.

## 2017-07-22 NOTE — Telephone Encounter (Signed)
If this is an evaluation for sleep disordered breathing ,then would prefer home sleep study

## 2017-07-23 ENCOUNTER — Ambulatory Visit (INDEPENDENT_AMBULATORY_CARE_PROVIDER_SITE_OTHER): Payer: Medicare Other | Admitting: Family Medicine

## 2017-07-23 ENCOUNTER — Other Ambulatory Visit: Payer: Self-pay | Admitting: Family Medicine

## 2017-07-23 ENCOUNTER — Encounter: Payer: Self-pay | Admitting: Family Medicine

## 2017-07-23 VITALS — BP 116/68 | HR 59 | Temp 97.1°F | Ht 61.0 in | Wt 153.0 lb

## 2017-07-23 DIAGNOSIS — R300 Dysuria: Secondary | ICD-10-CM | POA: Diagnosis not present

## 2017-07-23 DIAGNOSIS — M797 Fibromyalgia: Secondary | ICD-10-CM | POA: Diagnosis not present

## 2017-07-23 DIAGNOSIS — E7800 Pure hypercholesterolemia, unspecified: Secondary | ICD-10-CM | POA: Diagnosis not present

## 2017-07-23 DIAGNOSIS — G968 Other specified disorders of central nervous system: Secondary | ICD-10-CM

## 2017-07-23 DIAGNOSIS — G894 Chronic pain syndrome: Secondary | ICD-10-CM

## 2017-07-23 DIAGNOSIS — K219 Gastro-esophageal reflux disease without esophagitis: Secondary | ICD-10-CM | POA: Diagnosis not present

## 2017-07-23 DIAGNOSIS — I480 Paroxysmal atrial fibrillation: Secondary | ICD-10-CM | POA: Diagnosis not present

## 2017-07-23 DIAGNOSIS — E785 Hyperlipidemia, unspecified: Secondary | ICD-10-CM | POA: Diagnosis not present

## 2017-07-23 DIAGNOSIS — I10 Essential (primary) hypertension: Secondary | ICD-10-CM

## 2017-07-23 DIAGNOSIS — G9689 Other specified disorders of central nervous system: Secondary | ICD-10-CM

## 2017-07-23 DIAGNOSIS — E559 Vitamin D deficiency, unspecified: Secondary | ICD-10-CM

## 2017-07-23 MED ORDER — TAMSULOSIN HCL 0.4 MG PO CAPS: 0.4000 mg | ORAL_CAPSULE | Freq: Every day | ORAL | 3 refills | Status: DC

## 2017-07-23 MED ORDER — ALPRAZOLAM 0.5 MG PO TABS: 1.0000 mg | ORAL_TABLET | Freq: Every day | ORAL | 2 refills | Status: DC

## 2017-07-23 NOTE — Progress Notes (Signed)
Subjective:    Patient ID: [name], female    DOB: Feb 16, 1936, 81 y.o.   MRN: [id number]  HPI Pt here for follow up and management of chronic medical problems which includes hyperlipidemia and hypertension. She is taking medication regularly.The patient has recently had a chronic pain stimulator implanted by Dr. Rolena Infante. He is hoping this will help control some of her back pain. She recently also had a urinary tract infection and she would like her urine rechecked today. She will get lab work today and will be given an FOBT to return. The patient is also followed by cardiology and pulmonology. 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 She still having some adjustments made to the spinal cord stimulator by the orthopedic surgeon. She denies any chest pain or shortness of breath anymore than usual. She denies any trouble with nausea vomiting diarrhea blood in the stool or black tarry bowel movements or change in bowel habits. She is passing her water well. She did develop a urinary tract infection and has taken Cipro and is better with this but would like to have the urinalysis rechecked today. She follows up regularly with the cardiologist sometime around the first of 2019.    Patient Active Problem List   Diagnosis Date Noted  . Chronic pain 07/02/2017  . Vocal cord dysfunction 02/04/2017  . Irritable bowel syndrome with diarrhea 12/11/2015  . UTI (urinary tract infection) 08/22/2015  . Cough 07/26/2015  . Diarrhea 11/30/2014  . Heme + stool 11/30/2014  . Chronic anticoagulation 11/30/2014  . CHF (congestive heart failure) ([place]) 04/30/2014  . 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 Osteoporosis 01/27/2014  . Fibromyalgia 08/09/2013  . Asthma 08/01/2010  . Hypothyroidism 03/20/2010  . Depression 03/20/2010  . HTN (hypertension) 03/20/2010  .  ESOPHAGEAL MOTILITY DISORDER 03/20/2010  . Hyperlipidemia 04/22/2009  . Cor athrscl-uns vessel 04/22/2009  . ATRIAL FIBRILLATION, PAROXYSMAL 04/22/2009  . ESOPHAGEAL STRICTURE 10/31/2004  . GERD 10/31/2004  . HIATAL HERNIA 10/06/2001   Outpatient Encounter Prescriptions as of 07/23/2017  Medication Sig  . acetaminophen (TYLENOL) 650 MG CR tablet Take 650-1,300 mg by mouth every 8 (eight) hours as needed for pain. Depends on pain level if takes 1-2 tablets  . ALPRAZolam (XANAX) 0.5 MG tablet TAKE 2 TABLETS AT BEDTIME  . Ascorbic Acid (VITAMIN C) 1000 MG tablet Take 1,000 mg by mouth daily.  . beta carotene 25000 UNIT capsule Take 25,000 Units by mouth daily.  . budesonide (PULMICORT) 0.5 MG/2ML nebulizer solution Take 2 mLs (0.5 mg total) by nebulization 2 (two) times daily. Dx 496 (Patient taking differently: Take 0.5 mg by nebulization 2 (two) times daily as needed (shortness of breath). Dx 496)  . Calcium Carbonate-Vitamin D (CALCIUM 600+D) 600-400 MG-UNIT per tablet Take 1 tablet by mouth daily at 6 PM.   . cholecalciferol (VITAMIN D) 1000 UNITS tablet Take 1,000 Units by mouth 2 (two) times daily with breakfast and lunch.   . Coenzyme Q10 (COQ10) 100 MG CAPS Take 100 mg by mouth daily.  Marland Kitchen denosumab (PROLIA) 60 MG/ML SOLN injection Inject 60 mg into the skin every 6 (six) months. Administer in upper arm, thigh, or abdomen (Patient taking differently: Inject 60 mg into the skin every 6 (six) months. Administer in upper arm, thigh, or abdomen Next dose due 08-2017)  . dicyclomine (BENTYL)  10 MG capsule TAKE (1) OR (2) CAPSULES EVERY SIX HOURS AS NEEDED FOR SPASM  . diltiazem (CARDIZEM CD) 120 MG 24 hr capsule TAKE (1) CAPSULE DAILY (Patient taking differently: Take 120 mg by mouth daily. TAKE (1) CAPSULE DAILY)  . fluticasone (FLONASE) 50 MCG/ACT nasal spray 2 SPRAYS IN EACH NOSTRIL ONCE A DAY (Patient taking differently: 2 SPRAYS IN EACH NOSTRIL ONCE A DAY AS NEEDED FOR ALLERGIES)  .  HYDROcodone-acetaminophen (NORCO) 10-325 MG tablet Take 1 tablet by mouth every 4 (four) hours as needed.  Marland Kitchen levothyroxine (SYNTHROID, LEVOTHROID) 25 MCG tablet TAKE 2 TABLETS DAILY AS DIRECTED (Patient taking differently: Take 50 mcg by mouth daily before breakfast. TAKE 2 TABLETS DAILY AS DIRECTED)  . Liniments (ANALGESIC EX) Apply 1 application topically 3 (three) times daily as needed (pain).  Marland Kitchen loratadine (CLARITIN) 10 MG tablet Take 1 tablet (10 mg total) by mouth daily.  . meclizine (ANTIVERT) 25 MG tablet TAKE (1) TABLET THREE TIMES DAILY AS NEEDED FOR DIZZINESS.  . Melatonin 5 MG TABS Take 10 mg by mouth every evening.  . metoprolol tartrate (LOPRESSOR) 25 MG tablet Take 1 tablet (25 mg total) by mouth 2 (two) times daily. 2 tablets each morning and 1 tablet each evening (Patient taking differently: Take 25-50 mg by mouth 2 (two) times daily. 2 tablets each morning and 1 tablet each evening)  . montelukast (SINGULAIR) 10 MG tablet Take 1 tablet (10 mg total) by mouth daily. (Patient taking differently: Take 10 mg by mouth at bedtime. )  . nitroGLYCERIN (NITROSTAT) 0.4 MG SL tablet 1 tablet under the tongue every 5 minutes as needed for chest pain.  Marland Kitchen omega-3 acid ethyl esters (LOVAZA) 1 g capsule TAKE (2) CAPSULES TWICE DAILY. (Patient taking differently: Take 2 g by mouth 2 (two) times daily. TAKE (2) CAPSULES TWICE DAILY.)  . omeprazole-sodium bicarbonate (ZEGERID) 40-1100 MG capsule TAKE (1) CAPSULE DAILY  . ondansetron (ZOFRAN) 4 MG tablet TAKE 1 TABLET EVERY 12 HOURS AS NEEDED FOR NAUSEA  . PROAIR HFA 108 (90 Base) MCG/ACT inhaler USE 2 PUFFS EVERY 6 HOURS AS NEEDED FOR WHEEZING  . rivaroxaban (XARELTO) 20 MG TABS tablet Take 1 tablet (20 mg total) by mouth daily. (Patient taking differently: Take 20 mg by mouth daily with supper. )  . rosuvastatin (CRESTOR) 20 MG tablet Take 1 tablet (20 mg total) by mouth daily. (Patient taking differently: Take 20 mg by mouth every evening. )  .  sertraline (ZOLOFT) 100 MG tablet Take 1 tablet (100 mg total) by mouth daily. (Patient taking differently: Take 100 mg by mouth daily at 6 PM. )  . tamsulosin (FLOMAX) 0.4 MG CAPS capsule Take 1 capsule (0.4 mg total) by mouth daily after breakfast.  . [DISCONTINUED] Polyethyl Glycol-Propyl Glycol (SYSTANE OP) Apply 1 drop to eye daily as needed (dry eyes).  Marland Kitchen HYDROcodone-acetaminophen (NORCO) 5-325 MG tablet Take 1 tablet by mouth every 4 (four) hours as needed. (Patient not taking: Reported on 07/23/2017)  . [DISCONTINUED] benzonatate (TESSALON) 100 MG capsule TAKE 1 CAPSULE TWICE DAILY AS NEEDED FOR COUGH  . [DISCONTINUED] GuaiFENesin (MUCINEX PO) Take 1 tablet by mouth 2 (two) times daily.  . [DISCONTINUED] Melatonin 3 MG TABS Take 1-2 tablets (3-6 mg total) by mouth at bedtime as needed. (Patient not taking: Reported on 06/24/2017)  . [DISCONTINUED] ondansetron (ZOFRAN) 4 MG tablet Take 1 tablet (4 mg total) by mouth every 8 (eight) hours as needed for nausea or vomiting.  . [DISCONTINUED] ondansetron (ZOFRAN) 4 MG  tablet Take 1 tablet (4 mg total) by mouth every 8 (eight) hours as needed for nausea or vomiting.   No facility-administered encounter medications on file as of 07/23/2017.       Review of Systems  Constitutional: Negative.   HENT: Negative.   Eyes: Negative.   Respiratory: Negative.   Cardiovascular: Negative.   Gastrointestinal: Negative.   Endocrine: Negative.   Genitourinary: Positive for urgency.       Recent urine infection - recheck today  Musculoskeletal: Positive for back pain (following Dr Rolena Infante).  Skin: Negative.   Allergic/Immunologic: Negative.   Neurological: Negative.   Hematological: Negative.   Psychiatric/Behavioral: Negative.        Objective:   Physical Exam  Constitutional: She is oriented to person, place, and time. She appears well-developed and well-nourished. No distress.  The patient is pleasant and alert and appears much younger than her  stated age of 54  HENT:  Head: Normocephalic and atraumatic.  Right Ear: External ear normal.  Left Ear: External ear normal.  Nose: Nose normal.  Mouth/Throat: Oropharynx is clear and moist. No oropharyngeal exudate.  Eyes: Pupils are equal, round, and reactive to light. Conjunctivae and EOM are normal. Right eye exhibits no discharge. Left eye exhibits no discharge. No scleral icterus.  Neck: Normal range of motion. Neck supple. No thyromegaly present.  No bruits or thyromegaly with good mobility  Cardiovascular: Normal rate, regular rhythm, normal heart sounds and intact distal pulses.   No murmur heard. The heart was regular at 60/m  Pulmonary/Chest: Effort normal and breath sounds normal. No respiratory distress. She has no wheezes. She has no rales.  Abdominal: Soft. Bowel sounds are normal. She exhibits no mass. There is no tenderness. There is no rebound and no guarding.  There was some left upper quadrant tenderness in her belly and left lower quadrant tenderness and no suprapubic tenderness. There were no masses or organ enlargement or bruits  Musculoskeletal: She exhibits deformity. She exhibits no edema.  The patient uses a cane for ambulation because of her unsteady gait because of a foot deformity and ankle deformity on the right side.  Lymphadenopathy:    She has no cervical adenopathy.  Neurological: She is alert and oriented to person, place, and time. No cranial nerve deficit.  Reflexes in the right lower extremity are slightly diminished compared to the left.  Skin: Skin is warm and dry. No rash noted.  Psychiatric: She has a normal mood and affect. Her behavior is normal. Judgment and thought content normal.  Nursing note and vitals reviewed.  BP 116/68 (BP Location: Left Arm)   Pulse (!) 59   Temp (!) 97.1 F (36.2 C) (Oral)   Ht '5\' 1"'$  (1.549 m)   Wt 153 lb (69.4 kg)   BMI 28.91 kg/m        Assessment & Plan:  1. Pure hypercholesterolemia -Continue current  treatment and aggressive therapeutic lifestyle changes pending results of lab work - CBC with Differential/Platelet - Lipid panel  2. Essential hypertension -The blood pressure is good today and she will continue with current treatment - BMP8+EGFR - CBC with Differential/Platelet - Hepatic function panel  3. Paroxysmal atrial fibrillation (HCC) -The heart had a regular rate and rhythm today and she will continue to follow-up with cardiology as planned - CBC with Differential/Platelet  4. Vitamin D deficiency -Continue current treatment pending results of lab work - CBC with Differential/Platelet - VITAMIN D 25 Hydroxy (Vit-D Deficiency, Fractures)  5. Gastroesophageal  reflux disease, esophagitis presence not specified -Continue regularly with omeprazole and diet of avoidance - CBC with Differential/Platelet - Hepatic function panel  6. Fibromyalgia -Continue with current pain management - CBC with Differential/Platelet  7. Chronic pain syndrome -Follow-up with orthopedic surgeon regarding implantation of spinal cord stimulator.  8. Spinal cord cysts -Follow-up with neurosurgery as planned and as needed.  . Meds ordered this encounter  Medications  . tamsulosin (FLOMAX) 0.4 MG CAPS capsule    Sig: Take 1 capsule (0.4 mg total) by mouth daily after breakfast.    Dispense:  30 capsule    Refill:  3  . ALPRAZolam (XANAX) 0.5 MG tablet    Sig: Take 2 tablets (1 mg total) by mouth at bedtime.    Dispense:  60 tablet    Refill:  2   Patient Instructions                       Medicare Annual Wellness Visit  [facility] and the medical providers at [facility] strive to bring you the best medical care.  In doing so we not only want to address your current medical conditions and concerns but also to detect new conditions early and prevent illness, disease and health-related problems.    Medicare offers a yearly Wellness Visit which allows our clinical  staff to assess your need for preventative services including immunizations, lifestyle education, counseling to decrease risk of preventable diseases and screening for fall risk and other medical concerns.    This visit is provided free of charge (no copay) for all Medicare recipients. The clinical pharmacists at [facility] have begun to conduct these Wellness Visits which will also include a thorough review of all your medications.    As you primary medical provider recommend that you make an appointment for your Annual Wellness Visit if you have not done so already this year.  You may set up this appointment before you leave today or you may call back ([phone]) and schedule an appointment.  Please make sure when you call that you mention that you are scheduling your Annual Wellness Visit with the clinical pharmacist so that the appointment may be made for the proper length of time.     Continue current medications. Continue good therapeutic lifestyle changes which include good diet and exercise. Fall precautions discussed with patient. If an FOBT was given today- please return it to our front desk. If you are over 20 years old - you may need Prevnar 30 or the adult Pneumonia vaccine.  **Flu shots are available--- please call and schedule a FLU-CLINIC appointment**  After your visit with [place] today you will receive a survey in the mail or online from [employer] regarding your care with [place]. Please take a moment to fill this out. Your feedback is very important to [place] as you can help [place] better understand your patient needs as well as improve your experience and satisfaction. WE CARE ABOUT YOU!!!   Please make sure that the patient follows up with cardiology and neurosurgery  and orthopedics as planned     Arrie Senate MD

## 2017-07-23 NOTE — Patient Instructions (Addendum)
Medicare Annual Wellness Visit  [facility] and the medical providers at [facility] strive to bring you the best medical care.  In doing so we not only want to address your current medical conditions and concerns but also to detect new conditions early and prevent illness, disease and health-related problems.    Medicare offers a yearly Wellness Visit which allows our clinical staff to assess your need for preventative services including immunizations, lifestyle education, counseling to decrease risk of preventable diseases and screening for fall risk and other medical concerns.    This visit is provided free of charge (no copay) for all Medicare recipients. The clinical pharmacists at [facility] have begun to conduct these Wellness Visits which will also include a thorough review of all your medications.    As you primary medical provider recommend that you make an appointment for your Annual Wellness Visit if you have not done so already this year.  You may set up this appointment before you leave today or you may call back ([phone]) and schedule an appointment.  Please make sure when you call that you mention that you are scheduling your Annual Wellness Visit with the clinical pharmacist so that the appointment may be made for the proper length of time.     Continue current medications. Continue good therapeutic lifestyle changes which include good diet and exercise. Fall precautions discussed with patient. If an FOBT was given today- please return it to our front desk. If you are over 4 years old - you may need Prevnar 6 or the adult Pneumonia vaccine.  **Flu shots are available--- please call and schedule a FLU-CLINIC appointment**  After your visit with [place] today you will receive a survey in the mail or online from [employer] regarding your care with [place]. Please take a moment to fill this out. Your feedback is very  important to [place] as you can help [place] better understand your patient needs as well as improve your experience and satisfaction. WE CARE ABOUT YOU!!!   Please make sure that the patient follows up with cardiology and neurosurgery  and orthopedics as planned

## 2017-07-23 NOTE — Telephone Encounter (Signed)
lmtcb x2 for [name].

## 2017-07-24 NOTE — Telephone Encounter (Signed)
lmom tcb x1 to [facility]

## 2017-07-24 NOTE — Telephone Encounter (Signed)
[facility] is not open yet this AM. Will route message to triage for follow up.

## 2017-07-24 NOTE — Addendum Note (Signed)
Addended by: Zannie Cove on: 07/24/2017 06:39 AM   Modules accepted: Orders

## 2017-07-25 NOTE — Telephone Encounter (Signed)
lmomtcb  X 2 for [employer]

## 2017-07-27 LAB — LIPID PANEL W/O CHOL/HDL RATIO
Cholesterol, Total: 125 mg/dL (ref 100–199)
HDL: 54 mg/dL (ref >39)
LDL Calculated: 51 mg/dL (ref 0–99)
Triglycerides: 101 mg/dL (ref 0–149)
VLDL Cholesterol Cal: 20 mg/dL (ref 5–40)

## 2017-07-27 LAB — CBC WITH DIFFERENTIAL/PLATELET
Basophils Absolute: 0.1 10*3/uL (ref 0.0–0.2)
Basos: 2 %
EOS (ABSOLUTE): 0.3 10*3/uL (ref 0.0–0.4)
Eos: 4 %
Hematocrit: 36.3 % (ref 34.0–46.6)
Hemoglobin: 12.3 g/dL (ref 11.1–15.9)
Immature Grans (Abs): 0 10*3/uL (ref 0.0–0.1)
Immature Granulocytes: 0 %
Lymphocytes Absolute: 3.5 10*3/uL — ABNORMAL HIGH (ref 0.7–3.1)
Lymphs: 47 %
MCH: 31.1 pg (ref 26.6–33.0)
MCHC: 33.9 g/dL (ref 31.5–35.7)
MCV: 92 fL (ref 79–97)
Monocytes Absolute: 0.7 10*3/uL (ref 0.1–0.9)
Monocytes: 9 %
Neutrophils Absolute: 2.8 10*3/uL (ref 1.4–7.0)
Neutrophils: 38 %
Platelets: 324 10*3/uL (ref 150–379)
RBC: 3.96 x10E6/uL (ref 3.77–5.28)
RDW: 13.4 % (ref 12.3–15.4)
WBC: 7.3 10*3/uL (ref 3.4–10.8)

## 2017-07-27 LAB — HEPATIC FUNCTION PANEL
ALT: 14 IU/L (ref 0–32)
AST: 20 IU/L (ref 0–40)
Albumin: 4.6 g/dL (ref 3.5–4.8)
Alkaline Phosphatase: 85 IU/L (ref 39–117)
Bilirubin Total: 0.4 mg/dL (ref 0.0–1.2)
Bilirubin, Direct: 0.11 mg/dL (ref 0.00–0.40)
Total Protein: 7.3 g/dL (ref 6.0–8.5)

## 2017-07-27 LAB — URINE CULTURE

## 2017-07-27 LAB — BASIC METABOLIC PANEL
BUN/Creatinine Ratio: 20 (ref 12–28)
BUN: 15 mg/dL (ref 8–27)
CO2: 25 mmol/L (ref 20–29)
Calcium: 9.5 mg/dL (ref 8.7–10.3)
Chloride: 97 mmol/L (ref 96–106)
Creatinine, Ser: 0.75 mg/dL (ref 0.57–1.00)
GFR calc Af Amer: 88 mL/min/{1.73_m2} (ref >59)
GFR calc non Af Amer: 76 mL/min/{1.73_m2} (ref >59)
Glucose: 77 mg/dL (ref 65–99)
Potassium: 4.8 mmol/L (ref 3.5–5.2)
Sodium: 137 mmol/L (ref 134–144)

## 2017-07-27 LAB — VITAMIN D 25 HYDROXY (VIT D DEFICIENCY, FRACTURES): Vit D, 25-Hydroxy: 43.7 ng/mL (ref 30.0–100.0)

## 2017-07-28 MED ORDER — LEVOFLOXACIN 250 MG PO TABS: 250.0000 mg | ORAL_TABLET | Freq: Every day | ORAL | 0 refills | Status: DC

## 2017-07-28 NOTE — Addendum Note (Signed)
Addended byCarrolyn Leigh on: 07/28/2017 03:40 PM   Modules accepted: Orders

## 2017-07-28 NOTE — Telephone Encounter (Signed)
LMOM TCB x1 for [facility]

## 2017-07-28 NOTE — Telephone Encounter (Signed)
lmotcb x 3 for [employer]

## 2017-07-28 NOTE — Progress Notes (Signed)
Pt aware med called in

## 2017-07-28 NOTE — Telephone Encounter (Signed)
[phone] [id number] with [facility] is returning call.

## 2017-07-29 NOTE — Telephone Encounter (Signed)
I have called and LMOMTCB x 4 for [name] with [facility].

## 2017-07-30 ENCOUNTER — Other Ambulatory Visit: Payer: Self-pay | Admitting: Internal Medicine

## 2017-07-30 NOTE — Telephone Encounter (Signed)
lmomtcb x 3 for Marla at [facility]

## 2017-07-31 ENCOUNTER — Telehealth: Payer: Self-pay | Admitting: Pulmonary Disease

## 2017-07-31 NOTE — Telephone Encounter (Signed)
lmtcb for [name].

## 2017-07-31 NOTE — Telephone Encounter (Signed)
lmomtcb x 4 for Marla at [facility].  Will sign off of this message at this time per protocol.

## 2017-08-01 NOTE — Telephone Encounter (Signed)
lmomtcb x 2 for [employer]

## 2017-08-04 NOTE — Telephone Encounter (Signed)
lmtcb x3 for Marla. 

## 2017-08-05 NOTE — Telephone Encounter (Signed)
lmomtcb x 4 for [name] with [facility]  Will sign off of this message.  This is the 2nd message with trying to contact [name] about this pt.

## 2017-08-11 ENCOUNTER — Telehealth: Payer: Self-pay | Admitting: Family Medicine

## 2017-08-11 NOTE — Telephone Encounter (Signed)
Use miralax OTC

## 2017-08-11 NOTE — Telephone Encounter (Signed)
Patient aware of recommendations.  

## 2017-08-15 ENCOUNTER — Ambulatory Visit (INDEPENDENT_AMBULATORY_CARE_PROVIDER_SITE_OTHER): Payer: Medicare Other | Admitting: Family Medicine

## 2017-08-15 DIAGNOSIS — Z4789 Encounter for other orthopedic aftercare: Secondary | ICD-10-CM

## 2017-08-15 DIAGNOSIS — J42 Unspecified chronic bronchitis: Secondary | ICD-10-CM | POA: Diagnosis not present

## 2017-08-15 DIAGNOSIS — J45909 Unspecified asthma, uncomplicated: Secondary | ICD-10-CM

## 2017-08-15 DIAGNOSIS — G894 Chronic pain syndrome: Secondary | ICD-10-CM

## 2017-08-19 ENCOUNTER — Other Ambulatory Visit: Payer: Self-pay | Admitting: Cardiology

## 2017-08-19 ENCOUNTER — Other Ambulatory Visit: Payer: Self-pay

## 2017-08-19 MED ORDER — METOPROLOL TARTRATE 25 MG PO TABS: 25.0000 mg | ORAL_TABLET | Freq: Two times a day (BID) | ORAL | 5 refills | Status: DC

## 2017-08-29 ENCOUNTER — Other Ambulatory Visit: Payer: Self-pay | Admitting: Family Medicine

## 2017-09-10 DIAGNOSIS — J449 Chronic obstructive pulmonary disease, unspecified: Secondary | ICD-10-CM | POA: Diagnosis not present

## 2017-09-16 DIAGNOSIS — M5442 Lumbago with sciatica, left side: Secondary | ICD-10-CM | POA: Diagnosis not present

## 2017-09-16 DIAGNOSIS — G8929 Other chronic pain: Secondary | ICD-10-CM | POA: Diagnosis not present

## 2017-09-16 DIAGNOSIS — M5136 Other intervertebral disc degeneration, lumbar region: Secondary | ICD-10-CM | POA: Diagnosis not present

## 2017-09-16 DIAGNOSIS — M5416 Radiculopathy, lumbar region: Secondary | ICD-10-CM | POA: Diagnosis not present

## 2017-09-19 DIAGNOSIS — M7752 Other enthesopathy of left foot: Secondary | ICD-10-CM | POA: Diagnosis not present

## 2017-09-19 DIAGNOSIS — M7741 Metatarsalgia, right foot: Secondary | ICD-10-CM | POA: Diagnosis not present

## 2017-09-19 DIAGNOSIS — M12571 Traumatic arthropathy, right ankle and foot: Secondary | ICD-10-CM | POA: Diagnosis not present

## 2017-09-19 DIAGNOSIS — M21171 Varus deformity, not elsewhere classified, right ankle: Secondary | ICD-10-CM | POA: Diagnosis not present

## 2017-09-23 ENCOUNTER — Ambulatory Visit (INDEPENDENT_AMBULATORY_CARE_PROVIDER_SITE_OTHER): Payer: Medicare Other | Admitting: *Deleted

## 2017-09-23 DIAGNOSIS — Z23 Encounter for immunization: Secondary | ICD-10-CM

## 2017-10-23 ENCOUNTER — Encounter: Payer: Medicare Other | Admitting: *Deleted

## 2017-10-23 MED ORDER — DENOSUMAB 60 MG/ML ~~LOC~~ SOLN: 60.0000 mg | SUBCUTANEOUS | 0 refills | Status: DC

## 2017-10-28 ENCOUNTER — Telehealth: Payer: Self-pay | Admitting: Family Medicine

## 2017-10-29 ENCOUNTER — Other Ambulatory Visit: Payer: Self-pay | Admitting: Internal Medicine

## 2017-10-29 NOTE — Telephone Encounter (Signed)
Appt scheduled in nurse schedule by Brynda Peon. Patient aware.

## 2017-10-30 NOTE — Progress Notes (Signed)
Opened in error

## 2017-10-31 ENCOUNTER — Ambulatory Visit (INDEPENDENT_AMBULATORY_CARE_PROVIDER_SITE_OTHER): Payer: Medicare Other | Admitting: *Deleted

## 2017-10-31 DIAGNOSIS — M810 Age-related osteoporosis without current pathological fracture: Secondary | ICD-10-CM | POA: Diagnosis not present

## 2017-10-31 MED ORDER — DENOSUMAB 60 MG/ML ~~LOC~~ SOLN
60.0000 mg | Freq: Once | SUBCUTANEOUS | Status: AC
Start: 2017-10-31 — End: 2017-10-31
  Administered 2017-10-31: 60 mg via SUBCUTANEOUS

## 2017-11-13 ENCOUNTER — Other Ambulatory Visit: Payer: Self-pay | Admitting: Family Medicine

## 2017-11-14 ENCOUNTER — Telehealth: Payer: Self-pay | Admitting: Family Medicine

## 2017-11-14 NOTE — Telephone Encounter (Signed)
Try a Dulcolax suppository and keep pushing plenty of fluids and use MiraLAX also.

## 2017-11-14 NOTE — Telephone Encounter (Signed)
Patient aware and verbalizes understanding. 

## 2017-11-17 ENCOUNTER — Telehealth: Payer: Self-pay

## 2017-11-17 ENCOUNTER — Telehealth: Payer: Self-pay | Admitting: Family Medicine

## 2017-11-17 NOTE — Telephone Encounter (Signed)
Pt aware we will discuss WED She also will see [facility]

## 2017-11-17 NOTE — Telephone Encounter (Signed)
Patient states she has had 2 bowel impactions in the past couple months and would like to speak to nurse about medication to help her and advise on next appt

## 2017-11-17 NOTE — Telephone Encounter (Signed)
Pt reports she had a terrible time with constipation on Friday, finally had some results. Pt states she had some bleeding and now her rectum is quite sore. Discussed with pt that she may want to try Miralax daily to keep this from happening again. Pt also reports she is having trouble getting her reflux meds and needs to schedule an appointment. Pt scheduled to see [name] PA 11/20/17@3 :15pm. Pt aware of appt.

## 2017-11-17 NOTE — Telephone Encounter (Signed)
We will discuss problem at this Wednesdays visit.

## 2017-11-17 NOTE — Telephone Encounter (Signed)
For her stomach she has:  Bentyl Zofran Zegerid  Please address and changes

## 2017-11-18 ENCOUNTER — Other Ambulatory Visit: Payer: Self-pay | Admitting: Family

## 2017-11-19 ENCOUNTER — Ambulatory Visit (INDEPENDENT_AMBULATORY_CARE_PROVIDER_SITE_OTHER): Payer: Medicare Other | Admitting: Family Medicine

## 2017-11-19 ENCOUNTER — Encounter: Payer: Self-pay | Admitting: Family Medicine

## 2017-11-19 VITALS — BP 132/72 | HR 63 | Temp 97.3°F | Ht 61.0 in | Wt 153.0 lb

## 2017-11-19 DIAGNOSIS — N3941 Urge incontinence: Secondary | ICD-10-CM | POA: Diagnosis not present

## 2017-11-19 DIAGNOSIS — M797 Fibromyalgia: Secondary | ICD-10-CM

## 2017-11-19 DIAGNOSIS — E7800 Pure hypercholesterolemia, unspecified: Secondary | ICD-10-CM | POA: Diagnosis not present

## 2017-11-19 DIAGNOSIS — K219 Gastro-esophageal reflux disease without esophagitis: Secondary | ICD-10-CM | POA: Diagnosis not present

## 2017-11-19 DIAGNOSIS — R5383 Other fatigue: Secondary | ICD-10-CM

## 2017-11-19 DIAGNOSIS — R05 Cough: Secondary | ICD-10-CM

## 2017-11-19 DIAGNOSIS — I10 Essential (primary) hypertension: Secondary | ICD-10-CM

## 2017-11-19 DIAGNOSIS — E559 Vitamin D deficiency, unspecified: Secondary | ICD-10-CM

## 2017-11-19 DIAGNOSIS — R059 Cough, unspecified: Secondary | ICD-10-CM

## 2017-11-19 DIAGNOSIS — G894 Chronic pain syndrome: Secondary | ICD-10-CM

## 2017-11-19 DIAGNOSIS — I480 Paroxysmal atrial fibrillation: Secondary | ICD-10-CM | POA: Diagnosis not present

## 2017-11-19 LAB — URINALYSIS, COMPLETE
Bilirubin, UA: NEGATIVE
Glucose, UA: NEGATIVE
Ketones, UA: NEGATIVE
Leukocytes, UA: NEGATIVE
Nitrite, UA: NEGATIVE
Protein, UA: NEGATIVE
RBC, UA: NEGATIVE
Specific Gravity, UA: 1.02 (ref 1.005–1.030)
Urobilinogen, Ur: 0.2 mg/dL (ref 0.2–1.0)
pH, UA: 6.5 (ref 5.0–7.5)

## 2017-11-19 LAB — MICROSCOPIC EXAMINATION
RBC, UA: NONE SEEN /hpf (ref 0–?)
Renal Epithel, UA: NONE SEEN /hpf

## 2017-11-19 NOTE — Patient Instructions (Addendum)
Medicare Annual Wellness Visit  [facility] and the medical providers at [facility] strive to bring you the best medical care.  In doing so we not only want to address your current medical conditions and concerns but also to detect new conditions early and prevent illness, disease and health-related problems.    Medicare offers a yearly Wellness Visit which allows our clinical staff to assess your need for preventative services including immunizations, lifestyle education, counseling to decrease risk of preventable diseases and screening for fall risk and other medical concerns.    This visit is provided free of charge (no copay) for all Medicare recipients. The clinical pharmacists at [facility] have begun to conduct these Wellness Visits which will also include a thorough review of all your medications.    As you primary medical provider recommend that you make an appointment for your Annual Wellness Visit if you have not done so already this year.  You may set up this appointment before you leave today or you may call back ([phone]) and schedule an appointment.  Please make sure when you call that you mention that you are scheduling your Annual Wellness Visit with the clinical pharmacist so that the appointment may be made for the proper length of time.     Continue current medications. Continue good therapeutic lifestyle changes which include good diet and exercise. Fall precautions discussed with patient. If an FOBT was given today- please return it to our front desk. If you are over 61 years old - you may need Prevnar 57 or the adult Pneumonia vaccine.  **Flu shots are available--- please call and schedule a FLU-CLINIC appointment**  After your visit with [place] today you will receive a survey in the mail or online from [employer] regarding your care with [place]. Please take a moment to fill this out. Your feedback is very  important to [place] as you can help [place] better understand your patient needs as well as improve your experience and satisfaction. WE CARE ABOUT YOU!!!   The patient will see the gastroenterologist tomorrow.  Hopefully they can sort out the reason for the impaction and now the loose bowel movements. She also plans to follow-up with orthopedic surgeon who is being conservative with the management of her feet.  The patient appreciates that and as her primary care physician I also appreciate that. She will discussed with him about the orthotics and refining them even more and I think this is the answer to keeping her moving without having to do more surgery and I think the patient agrees to that.   The patient should continue to drink plenty of fluids and stay well-hydrated She should continue to use nasal saline and Mucinex for cough and congestion and keep the house as cool as possible especially in the winter months. Continue to follow-up with cardiology as needed Follow-up with orthopedics as planned She should continue with the Flomax for bladder control We will call with results of the urinalysis and urine culture and sensitivity as soon as that is returned

## 2017-11-19 NOTE — Progress Notes (Signed)
Subjective:    Patient ID: [name], female    DOB: 1936-04-10, 81 y.o.   MRN: [id number]  HPI Pt here for follow up and management of chronic medical problems which includes hypertension and hyperlipidemia. She is taking medication regularly.  She is having a lot of gastrointestinal issues mostly constipation.  She has an appointment with gastroenterologist tomorrow.  She will be given an FOBT to recurrent current and will get lab work today.  She also complains of sinus drainage and congestion and back and right foot pain.  Her vital signs are stable and her weight is stable.  She is followed regularly by the cardiologist.  This is because of her atrial fibrillation.  She has had a history of heart failure also.  She also has hypertension hyperlipidemia and thyroid problems.  Multiple orthopedic surgeries.  The patient has an appointment with a gastroenterologist tomorrow.  She denies any chest pain or shortness of breath.  She sees the cardiologist again in February which is a yearly visit time.  She does take omeprazole and this is really helped her reflux symptoms and she will continue to take this if the gastroenterologist refills this when she sees him tomorrow.  Recently she has had constipation and an impaction and then she started taking MiraLAX and she is now having blowout bowel movements.  She did have some blood with some of the loose stools and she is very sore in her rectal area currently.  She will be seen a gastrologist tomorrow and we will wait until that visit to make any further decisions about her bowel movements.  She does have a incontinence and urgency and has had poor control recently so we will check a urinalysis today.  She also has ongoing problems with both of her feet and because of the pain that she has in her feet she is not able to walk regularly and uses a rolling walker around the house most of the time.  She is very frustrated with her orthopedic issues and is still  seeing an orthopedic surgeon in [place].    Patient Active Problem List   Diagnosis Date Noted  . Chronic pain 07/02/2017  . Vocal cord dysfunction 02/04/2017  . Irritable bowel syndrome with diarrhea 12/11/2015  . UTI (urinary tract infection) 08/22/2015  . Cough 07/26/2015  . Diarrhea 11/30/2014  . Heme + stool 11/30/2014  . Chronic anticoagulation 11/30/2014  . CHF (congestive heart failure) ([facility]) 04/30/2014  . 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 Osteoporosis 01/27/2014  . Fibromyalgia 08/09/2013  . Asthma 08/01/2010  . Hypothyroidism 03/20/2010  . Depression 03/20/2010  . HTN (hypertension) 03/20/2010  . ESOPHAGEAL MOTILITY DISORDER 03/20/2010  . Hyperlipidemia 04/22/2009  . Cor athrscl-uns vessel 04/22/2009  . ATRIAL FIBRILLATION, PAROXYSMAL 04/22/2009  . ESOPHAGEAL STRICTURE 10/31/2004  . GERD 10/31/2004  . HIATAL HERNIA 10/06/2001   Outpatient Encounter Medications as of 11/19/2017  Medication Sig  . acetaminophen (TYLENOL) 650 MG CR tablet Take 650-1,300 mg by mouth every 8 (eight) hours as needed for pain. Depends on pain level if takes 1-2 tablets  . ALPRAZolam (XANAX) 0.5 MG tablet Take 2 tablets (1 mg total) by mouth at bedtime.  . Ascorbic Acid (VITAMIN C) 1000 MG tablet Take 1,000 mg by mouth daily.  . beta carotene 25000 UNIT capsule Take 25,000 Units by mouth daily.  . budesonide (PULMICORT) 0.5 MG/2ML nebulizer solution Take 2 mLs (0.5 mg total) by nebulization 2 (two) times daily. Dx 496 (Patient  taking differently: Take 0.5 mg by nebulization 2 (two) times daily as needed (shortness of breath). Dx 496)  . Calcium Carbonate-Vitamin D (CALCIUM 600+D) 600-400 MG-UNIT per tablet Take 1 tablet by mouth daily at 6 PM.   . cholecalciferol (VITAMIN D) 1000 UNITS tablet Take 1,000 Units by mouth 2 (two) times daily with breakfast and lunch.   . Coenzyme Q10 (COQ10) 100 MG CAPS Take 100 mg by mouth daily.  Marland Kitchen denosumab (PROLIA) 60 MG/ML SOLN injection  Inject 60 mg every 6 (six) months into the skin. Administer in upper arm, thigh, or abdomen  . dicyclomine (BENTYL) 10 MG capsule TAKE (1) OR (2) CAPSULES EVERY SIX HOURS AS NEEDED FOR SPASM  . diltiazem (CARDIZEM CD) 120 MG 24 hr capsule TAKE (1) CAPSULE DAILY (Patient taking differently: Take 120 mg by mouth daily. TAKE (1) CAPSULE DAILY)  . fluticasone (FLONASE) 50 MCG/ACT nasal spray 2 SPRAYS IN EACH NOSTRIL ONCE A DAY (Patient taking differently: 2 SPRAYS IN EACH NOSTRIL ONCE A DAY AS NEEDED FOR ALLERGIES)  . HYDROcodone-acetaminophen (NORCO) 5-325 MG tablet Take 1 tablet by mouth every 4 (four) hours as needed.  Marland Kitchen levofloxacin (LEVAQUIN) 250 MG tablet Take 1 tablet (250 mg total) by mouth daily.  Marland Kitchen levothyroxine (SYNTHROID, LEVOTHROID) 25 MCG tablet TAKE 2 TABLETS DAILY AS DIRECTED (Patient taking differently: Take 50 mcg by mouth daily before breakfast. TAKE 2 TABLETS DAILY AS DIRECTED)  . Liniments (ANALGESIC EX) Apply 1 application topically 3 (three) times daily as needed (pain).  Marland Kitchen loratadine (CLARITIN) 10 MG tablet Take 1 tablet (10 mg total) by mouth daily.  . meclizine (ANTIVERT) 25 MG tablet TAKE (1) TABLET THREE TIMES DAILY AS NEEDED FOR DIZZINESS.  . Melatonin 5 MG TABS Take 10 mg by mouth every evening.  . metoprolol tartrate (LOPRESSOR) 25 MG tablet Take 1-2 tablets (25-50 mg total) by mouth 2 (two) times daily. 2 tablets each morning and 1 tablet each evening  . montelukast (SINGULAIR) 10 MG tablet Take 1 tablet (10 mg total) by mouth daily. (Patient taking differently: Take 10 mg by mouth at bedtime. )  . nitroGLYCERIN (NITROSTAT) 0.4 MG SL tablet 1 tablet under the tongue every 5 minutes as needed for chest pain.  Marland Kitchen omega-3 acid ethyl esters (LOVAZA) 1 g capsule TAKE (2) CAPSULES TWICE DAILY.  Marland Kitchen omeprazole-sodium bicarbonate (ZEGERID) 40-1100 MG capsule TAKE (1) CAPSULE DAILY  . ondansetron (ZOFRAN) 4 MG tablet TAKE 1 TABLET EVERY 12 HOURS AS NEEDED FOR NAUSEA  . PROAIR HFA 108  (90 Base) MCG/ACT inhaler USE 2 PUFFS EVERY 6 HOURS AS NEEDED FOR WHEEZING  . rosuvastatin (CRESTOR) 20 MG tablet Take 1 tablet (20 mg total) by mouth daily.  . sertraline (ZOLOFT) 100 MG tablet Take 1 tablet (100 mg total) by mouth daily.  . tamsulosin (FLOMAX) 0.4 MG CAPS capsule Take 1 capsule (0.4 mg total) by mouth daily after breakfast.  . XARELTO 20 MG TABS tablet Take 1 tablet (20 mg total) by mouth daily.  . [DISCONTINUED] benzonatate (TESSALON) 100 MG capsule TAKE 1 CAPSULE TWICE DAILY AS NEEDED FOR COUGH  . [DISCONTINUED] HYDROcodone-acetaminophen (NORCO) 10-325 MG tablet Take 1 tablet by mouth every 4 (four) hours as needed.   No facility-administered encounter medications on file as of 11/19/2017.      Review of Systems  Constitutional: Negative.   HENT: Positive for postnasal drip.   Eyes: Negative.   Respiratory: Negative.   Cardiovascular: Negative.   Gastrointestinal: Positive for constipation (GI appt tomorrow ).  Endocrine: Negative.   Genitourinary: Negative.   Musculoskeletal: Positive for arthralgias (right foot pain ) and back pain.  Skin: Negative.   Allergic/Immunologic: Negative.   Neurological: Negative.   Hematological: Negative.   Psychiatric/Behavioral: Negative.        Objective:   Physical Exam  Constitutional: She is oriented to person, place, and time. She appears well-developed and well-nourished. No distress.  The patient is pleasant and alert and concerned about her impaction/loose bowel movement issues and her ongoing foot pain and inability to walk without pain.  HENT:  Head: Normocephalic and atraumatic.  Right Ear: External ear normal.  Left Ear: External ear normal.  Nose: Nose normal.  Mouth/Throat: Oropharynx is clear and moist.  Nasal congestion bilaterally and slight redness in the posterior throat  Eyes: Conjunctivae and EOM are normal. Pupils are equal, round, and reactive to light. Right eye exhibits no discharge. Left eye  exhibits no discharge. No scleral icterus.  Neck: Normal range of motion. Neck supple. No thyromegaly present.  No anterior cervical adenopathy  Cardiovascular: Normal rate, regular rhythm and intact distal pulses.  No murmur heard. Heart is regular at 60/min  Pulmonary/Chest: Effort normal and breath sounds normal. No respiratory distress. She has no wheezes. She has no rales.  Lungs are clear anteriorly and posteriorly  Abdominal: Soft. Bowel sounds are normal. She exhibits no mass. There is no tenderness. There is no rebound and no guarding.  Generalized and suprapubic abdominal tenderness.  No liver or spleen enlargement.  No masses.  Normal bowel sounds.  Musculoskeletal: She exhibits tenderness and deformity. She exhibits no edema.  Patient has a leg length discrepancy.  She has a right foot and ankle which have a valgus deformity.  This is due to multiple surgeries from the past.  Any pressure on this extremity causes pain in her foot and ankle.  Lymphadenopathy:    She has no cervical adenopathy.  Neurological: She is alert and oriented to person, place, and time. She has normal reflexes. No cranial nerve deficit.  Skin: Skin is warm and dry. No rash noted.  Psychiatric: She has a normal mood and affect. Her behavior is normal. Judgment and thought content normal.  Nursing note and vitals reviewed.  BP 132/72 (BP Location: Left Arm)   Pulse 63   Temp (!) 97.3 F (36.3 C) (Oral)   Ht _0  (1.549 m)   Wt 153 lb (69.4 kg)   BMI 28.91 kg/m        Assessment & Plan:  1. Essential hypertension -The blood pressure is good today and she will continue with current treatment - CBC with Differential/Platelet - BMP8+EGFR - Hepatic function panel  2. Pure hypercholesterolemia -Continue with aggressive therapeutic lifestyle changes omega-3 fatty acids and Crestor pending results of lab work - CBC with Differential/Platelet - Lipid panel  3. Paroxysmal atrial fibrillation  (HCC) -Heart had a regular rate and rhythm today but she should continue with her Xarelto because of the paroxysmal atrial fibrillation. -She should continue follow-up with cardiology. - CBC with Differential/Platelet  4. Vitamin D deficiency -Continue current treatment pending the results of lab work - CBC with Differential/Platelet - VITAMIN D 25 Hydroxy (Vit-D Deficiency, Fractures)  5. Gastroesophageal reflux disease, esophagitis presence not specified -Follow-up with gastroenterology tomorrow and continue with omeprazole pending the visit to see them. - CBC with Differential/Platelet  6. Fibromyalgia -Continue with regular exercise as much as - CBC with Differential/Platelet  7. Chronic pain syndrome -Continue follow-up with orthopedics  to get adjustments to orthotics so that she is able to walk better without pain - CBC with Differential/Platelet  8. Other fatigue - Thyroid Panel With TSH  9. Cough -Continue with nasal saline and Mucinex for cough and congestion  10. Urge incontinence of urine -Urinalysis today.  Continue with Flomax or  tamsulosin  Patient Instructions                       Medicare Annual Wellness Visit  [facility] and the medical providers at [facility] strive to bring you the best medical care.  In doing so we not only want to address your current medical conditions and concerns but also to detect new conditions early and prevent illness, disease and health-related problems.    Medicare offers a yearly Wellness Visit which allows our clinical staff to assess your need for preventative services including immunizations, lifestyle education, counseling to decrease risk of preventable diseases and screening for fall risk and other medical concerns.    This visit is provided free of charge (no copay) for all Medicare recipients. The clinical pharmacists at [facility] have begun to conduct these Wellness  Visits which will also include a thorough review of all your medications.    As you primary medical provider recommend that you make an appointment for your Annual Wellness Visit if you have not done so already this year.  You may set up this appointment before you leave today or you may call back ([phone]) and schedule an appointment.  Please make sure when you call that you mention that you are scheduling your Annual Wellness Visit with the clinical pharmacist so that the appointment may be made for the proper length of time.     Continue current medications. Continue good therapeutic lifestyle changes which include good diet and exercise. Fall precautions discussed with patient. If an FOBT was given today- please return it to our front desk. If you are over 61 years old - you may need Prevnar 1 or the adult Pneumonia vaccine.  **Flu shots are available--- please call and schedule a FLU-CLINIC appointment**  After your visit with [place] today you will receive a survey in the mail or online from [employer] regarding your care with [place]. Please take a moment to fill this out. Your feedback is very important to [place] as you can help [place] better understand your patient needs as well as improve your experience and satisfaction. WE CARE ABOUT YOU!!!   The patient will see the gastroenterologist tomorrow.  Hopefully they can sort out the reason for the impaction and now the loose bowel movements. She also plans to follow-up with orthopedic surgeon who is being conservative with the management of her feet.  The patient appreciates that and as her primary care physician I also appreciate that. She will discussed with him about the orthotics and refining them even more and I think this is the answer to keeping her moving without having to do more surgery and I think the patient agrees to that.   The patient should continue to drink plenty of fluids and stay well-hydrated She should continue to use nasal saline  and Mucinex for cough and congestion and keep the house as cool as possible especially in the winter months. Continue to follow-up with cardiology as needed Follow-up with orthopedics as planned She should continue with the Flomax for bladder control We will call with results of the urinalysis and urine  culture and sensitivity as soon as that is returned   Arrie Senate MD

## 2017-11-19 NOTE — Telephone Encounter (Signed)
Last seen 07/23/17  DWM

## 2017-11-19 NOTE — Addendum Note (Signed)
Addended by: Zannie Cove on: 11/19/2017 04:06 PM   Modules accepted: Orders

## 2017-11-20 ENCOUNTER — Ambulatory Visit (INDEPENDENT_AMBULATORY_CARE_PROVIDER_SITE_OTHER): Payer: Medicare Other | Admitting: Physician Assistant

## 2017-11-20 ENCOUNTER — Telehealth: Payer: Self-pay | Admitting: Physician Assistant

## 2017-11-20 ENCOUNTER — Encounter: Payer: Self-pay | Admitting: Physician Assistant

## 2017-11-20 VITALS — BP 158/68 | HR 86 | Ht 61.0 in | Wt 152.0 lb

## 2017-11-20 DIAGNOSIS — K219 Gastro-esophageal reflux disease without esophagitis: Secondary | ICD-10-CM

## 2017-11-20 DIAGNOSIS — K625 Hemorrhage of anus and rectum: Secondary | ICD-10-CM | POA: Diagnosis not present

## 2017-11-20 DIAGNOSIS — K602 Anal fissure, unspecified: Secondary | ICD-10-CM

## 2017-11-20 DIAGNOSIS — K5900 Constipation, unspecified: Secondary | ICD-10-CM

## 2017-11-20 LAB — BMP8+EGFR
BUN/Creatinine Ratio: 16 (ref 12–28)
BUN: 12 mg/dL (ref 8–27)
CO2: 28 mmol/L (ref 20–29)
Calcium: 9.2 mg/dL (ref 8.7–10.3)
Chloride: 98 mmol/L (ref 96–106)
Creatinine, Ser: 0.74 mg/dL (ref 0.57–1.00)
GFR calc Af Amer: 89 mL/min/{1.73_m2} (ref >59)
GFR calc non Af Amer: 77 mL/min/{1.73_m2} (ref >59)
Glucose: 83 mg/dL (ref 65–99)
Potassium: 5 mmol/L (ref 3.5–5.2)
Sodium: 138 mmol/L (ref 134–144)

## 2017-11-20 LAB — CBC WITH DIFFERENTIAL/PLATELET
Basophils Absolute: 0.1 10*3/uL (ref 0.0–0.2)
Basos: 1 %
EOS (ABSOLUTE): 0.2 10*3/uL (ref 0.0–0.4)
Eos: 2 %
Hematocrit: 37.4 % (ref 34.0–46.6)
Hemoglobin: 12.4 g/dL (ref 11.1–15.9)
Immature Grans (Abs): 0 10*3/uL (ref 0.0–0.1)
Immature Granulocytes: 0 %
Lymphocytes Absolute: 4 10*3/uL — ABNORMAL HIGH (ref 0.7–3.1)
Lymphs: 47 %
MCH: 30.5 pg (ref 26.6–33.0)
MCHC: 33.2 g/dL (ref 31.5–35.7)
MCV: 92 fL (ref 79–97)
Monocytes Absolute: 0.7 10*3/uL (ref 0.1–0.9)
Monocytes: 8 %
Neutrophils Absolute: 3.6 10*3/uL (ref 1.4–7.0)
Neutrophils: 42 %
Platelets: 271 10*3/uL (ref 150–379)
RBC: 4.06 x10E6/uL (ref 3.77–5.28)
RDW: 13.9 % (ref 12.3–15.4)
WBC: 8.5 10*3/uL (ref 3.4–10.8)

## 2017-11-20 LAB — LIPID PANEL
Chol/HDL Ratio: 2.5 ratio (ref 0.0–4.4)
Cholesterol, Total: 129 mg/dL (ref 100–199)
HDL: 52 mg/dL (ref >39)
LDL Calculated: 58 mg/dL (ref 0–99)
Triglycerides: 93 mg/dL (ref 0–149)
VLDL Cholesterol Cal: 19 mg/dL (ref 5–40)

## 2017-11-20 LAB — VITAMIN D 25 HYDROXY (VIT D DEFICIENCY, FRACTURES): Vit D, 25-Hydroxy: 44.1 ng/mL (ref 30.0–100.0)

## 2017-11-20 LAB — THYROID PANEL WITH TSH
Free Thyroxine Index: 1.7 (ref 1.2–4.9)
T3 Uptake Ratio: 26 % (ref 24–39)
T4, Total: 6.7 ug/dL (ref 4.5–12.0)
TSH: 2.25 u[IU]/mL (ref 0.450–4.500)

## 2017-11-20 LAB — HEPATIC FUNCTION PANEL
ALT: 13 IU/L (ref 0–32)
AST: 20 IU/L (ref 0–40)
Albumin: 4.4 g/dL (ref 3.5–4.8)
Alkaline Phosphatase: 112 IU/L (ref 39–117)
Bilirubin Total: 0.3 mg/dL (ref 0.0–1.2)
Bilirubin, Direct: 0.09 mg/dL (ref 0.00–0.40)
Total Protein: 7.4 g/dL (ref 6.0–8.5)

## 2017-11-20 MED ORDER — OMEPRAZOLE-SODIUM BICARBONATE 40-1100 MG PO CAPS: 1.0000 | ORAL_CAPSULE | Freq: Every day | ORAL | 3 refills | Status: DC

## 2017-11-20 MED ORDER — AMBULATORY NON FORMULARY MEDICATION: 0.1250 mg | Freq: Three times a day (TID) | 1 refills | Status: DC

## 2017-11-20 NOTE — Progress Notes (Signed)
Chief Complaint: Constipation/impaction, GERD  HPI:    [name] is a 81 year old Caucasian female with a past medical history as listed below including CAD maintained on Xarelto, who follows with Dr. Henrene Pastor and presents to clinic today with a complaint of recent fecal impaction/constipation reflux medicine.    Patient was last seen in clinic 11/22/15 with a complaint of irritable bowel syndrome.  Last colonoscopy was 12/2014 with finding of two adenomatous colon polyps which were removed as well as moderate diverticulosis.  She also had an EGD in 2010 which was normal, but a 16 mm savory dilator was passed for suspected hypertensive LES.    Today, the patient presents to clinic with a primary complaint of recent fecal impaction and constipation.  Patient tells me that she had a "hip procedure" recently and has been on pain medicine, due to this she has been experiencing constipation.  Last Friday, she had not had a bowel movement for 3-4 days and felt like she needed to have a very large bowel movement but "it would not budge".  The patient gave herself an enema which was "unsuccessful" and also tried to get this out with a glove wrapped finger.  Patient tells me that she finally had a very large bowel movement that felt like it "tore something" when it came out.  Patient had some bleeding prior to and also after this big bowel movement, but has not seen any recently.  Patient also had episodes of vomiting that day, but has had no problems since.  She did call her office and was told to start [place].  She has been using this once daily and tells me that she has been having daily bowel movements though these are small and " not normal".  Patient denies any bright red blood or any melenic stool.  Patient does continue with a rectal pain which she describes as "sharp" and worse when she is passing a bowel movement.  She also complains of some generalized weakness.    Patient describes her chronic reflux is  controlled on Zegerid daily.    Patient denies fever, chills, weight loss, anorexia, continued nausea or vomiting or abdominal pain.  Past Medical History:  Diagnosis Date  . Anxiety   . Anxiety disorder   . Arthritis   . Asthmatic bronchitis   . Atrial fibrillation ([place])   . Atrial fibrillation ([place])   . Bowel obstruction (HCC)    blockage  . CAD (coronary artery disease)    Stent to RI 2003.  Myoview 2013 no ischemia.  . Cataract   . Chronic back pain   . Chronic bronchitis ([place])   . Chronic pain syndrome   . Colon polyp    adenomatous  . Congestive heart disease (HCC)    Preserved EF  . Depression   . Dysrhythmia   . Encephalitis    d/t meningitis  . Esophageal motility disorder   . Fibromyalgia   . GERD (gastroesophageal reflux disease)   . History of hiatal hernia   . History of kidney stones   . Hyperlipidemia   . Hypertension   . Hypothyroid   . 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 Myocardial infarction ([place])    2000  . Nephrolithiasis   . OA (osteoarthritis)   . Pleural effusion on right   . Pneumonia   . PONV (postoperative nausea and vomiting)   . Status post dilation of esophageal narrowing   . Vitamin  D deficiency     Past Surgical History:  Procedure Laterality Date  . ABDOMINAL HYSTERECTOMY     partial  . ANKLE FUSION  08/27/2012   Procedure: ARTHRODESIS ANKLE;  Surgeon: Wylene Simmer, MD;  Location: [place];  Service: Orthopedics;  Laterality: Right;  Arthrodesis right ankle and subtalar joint  . APPENDECTOMY    . ARTHRODESIS TIBIOFIBULAR Right 03/31/2014   Procedure: REVISION OF SUBTALOR ARTHRODESIS  RIGHT ;  Surgeon: Wylene Simmer, MD;  Location: [place];  Service: Orthopedics;  Laterality: Right;  . BACK SURGERY    . BREAST SURGERY     Left breast lump removed  . BRONCHOSCOPY    . CORONARY ANGIOPLASTY WITH STENT PLACEMENT  2003  . DECORTICATION Right 05/03/2014   Procedure: DECORTICATION;  Surgeon: [name], MD;   Location: [place];  Service: Thoracic;  Laterality: Right;  . EYE SURGERY  2016   cateracts  . FOOT ARTHRODESIS, SUBTALAR Right 2013  . HARDWARE REMOVAL Right 03/31/2014   Procedure: REMOVAL OF DEEP IMPLANTS X 3  RIGHT ;  Surgeon: Wylene Simmer, MD;  Location: [place];  Service: Orthopedics;  Laterality: Right;  . HARDWARE REMOVAL Right 11/03/2014   Procedure: HARDWARE REMOVAL OF SUBTALAR JOINT;  Surgeon: Wylene Simmer, MD;  Location: [place];  Service: Orthopedics;  Laterality: Right;  . HARDWARE REVISION  03/31/2014   SUBTALOR  ARTHRODESIS       DR HEWITT  . JOINT REPLACEMENT     Right knee  . KNEE ARTHROSCOPY  03/26/2012   Procedure: ARTHROSCOPY KNEE;  Surgeon: Wylene Simmer, MD;  Location: [facility];  Service: Orthopedics;  Laterality: Left;  with Debridement of Lateral Meniscus tear  . REMOVAL OF IMPLANT Right 03/31/2014   DR HEWITT  . ROTATOR CUFF REPAIR Bilateral   . SINUS SURGERY WITH INSTATRAK    . SPINAL CORD STIMULATOR INSERTION N/A 07/02/2017   Procedure: LUMBAR SPINAL CORD STIMULATOR INSERTION;  Surgeon: Melina Schools, MD;  Location: [facility];  Service: Orthopedics;  Laterality: N/A;  120 mins  . TOTAL KNEE ARTHROPLASTY Right   . VIDEO ASSISTED THORACOSCOPY (VATS)/EMPYEMA Right 05/03/2014   Procedure: VIDEO ASSISTED THORACOSCOPY (VATS)/EMPYEMA;  Surgeon: [name], MD;  Location: [place];  Service: Thoracic;  Laterality: Right;  Marland Kitchen VIDEO BRONCHOSCOPY N/A 05/03/2014   Procedure: VIDEO BRONCHOSCOPY;  Surgeon: [name], MD;  Location: [facility] OR;  Service: Thoracic;  Laterality: N/A;    Current Outpatient Medications  Medication Sig Dispense Refill  . acetaminophen (TYLENOL) 650 MG CR tablet Take 650-1,300 mg by mouth every 8 (eight) hours as needed for pain. Depends on pain level if takes 1-2 tablets    . ALPRAZolam (XANAX) 0.5 MG tablet Take 2 tablets (1 mg total) by mouth at bedtime. 60 tablet 2  . Ascorbic Acid (VITAMIN C) 1000 MG tablet Take 1,000 mg by mouth  daily.    . beta carotene 25000 UNIT capsule Take 25,000 Units by mouth daily.    . budesonide (PULMICORT) 0.5 MG/2ML nebulizer solution Take 2 mLs (0.5 mg total) by nebulization 2 (two) times daily. Dx 496 (Patient taking differently: Take 0.5 mg by nebulization 2 (two) times daily as needed (shortness of breath). Dx 496) 120 mL 6  . Calcium Carbonate-Vitamin D (CALCIUM 600+D) 600-400 MG-UNIT per tablet Take 1 tablet by mouth daily at 6 PM.     . cholecalciferol (VITAMIN D) 1000 UNITS tablet Take 1,000 Units by mouth 2 (two) times daily with breakfast and lunch.     Marland Kitchen  Coenzyme Q10 (COQ10) 100 MG CAPS Take 100 mg by mouth daily.    Marland Kitchen denosumab (PROLIA) 60 MG/ML SOLN injection Inject 60 mg every 6 (six) months into the skin. Administer in upper arm, thigh, or abdomen 1 mL 0  . dicyclomine (BENTYL) 10 MG capsule TAKE (1) OR (2) CAPSULES EVERY SIX HOURS AS NEEDED FOR SPASM 60 capsule 0  . diltiazem (CARDIZEM CD) 120 MG 24 hr capsule TAKE (1) CAPSULE DAILY (Patient taking differently: Take 120 mg by mouth daily. TAKE (1) CAPSULE DAILY) 90 capsule 2  . fluticasone (FLONASE) 50 MCG/ACT nasal spray 2 SPRAYS IN EACH NOSTRIL ONCE A DAY (Patient taking differently: 2 SPRAYS IN EACH NOSTRIL ONCE A DAY AS NEEDED FOR ALLERGIES) 16 g 5  . HYDROcodone-acetaminophen (NORCO) 5-325 MG tablet Take 1 tablet by mouth every 4 (four) hours as needed. 90 tablet 0  . levofloxacin (LEVAQUIN) 250 MG tablet Take 1 tablet (250 mg total) by mouth daily. 3 tablet 0  . levothyroxine (SYNTHROID, LEVOTHROID) 25 MCG tablet TAKE 2 TABLETS DAILY AS DIRECTED (Patient taking differently: Take 50 mcg by mouth daily before breakfast. TAKE 2 TABLETS DAILY AS DIRECTED) 180 tablet 3  . Liniments (ANALGESIC EX) Apply 1 application topically 3 (three) times daily as needed (pain).    Marland Kitchen loratadine (CLARITIN) 10 MG tablet Take 1 tablet (10 mg total) by mouth daily. 90 tablet 3  . meclizine (ANTIVERT) 25 MG tablet TAKE (1) TABLET THREE TIMES DAILY  AS NEEDED FOR DIZZINESS. 30 tablet 0  . Melatonin 5 MG TABS Take 10 mg by mouth every evening.    . metoprolol tartrate (LOPRESSOR) 25 MG tablet Take 1-2 tablets (25-50 mg total) by mouth 2 (two) times daily. 2 tablets each morning and 1 tablet each evening 90 tablet 5  . montelukast (SINGULAIR) 10 MG tablet Take 1 tablet (10 mg total) by mouth daily. (Patient taking differently: Take 10 mg by mouth at bedtime. ) 90 tablet 3  . nitroGLYCERIN (NITROSTAT) 0.4 MG SL tablet 1 tablet under the tongue every 5 minutes as needed for chest pain. 25 tablet 1  . omega-3 acid ethyl esters (LOVAZA) 1 g capsule TAKE (2) CAPSULES TWICE DAILY. 360 capsule 1  . omeprazole-sodium bicarbonate (ZEGERID) 40-1100 MG capsule Take 1 capsule by mouth daily. 90 capsule 3  . ondansetron (ZOFRAN) 4 MG tablet TAKE 1 TABLET EVERY 12 HOURS AS NEEDED FOR NAUSEA 20 tablet 0  . PROAIR HFA 108 (90 Base) MCG/ACT inhaler USE 2 PUFFS EVERY 6 HOURS AS NEEDED FOR WHEEZING 8.5 g 0  . rosuvastatin (CRESTOR) 20 MG tablet Take 1 tablet (20 mg total) by mouth daily. 90 tablet 1  . sertraline (ZOLOFT) 100 MG tablet Take 1 tablet (100 mg total) by mouth daily. 90 tablet 0  . tamsulosin (FLOMAX) 0.4 MG CAPS capsule Take 1 capsule (0.4 mg total) by mouth daily after breakfast. 30 capsule 0  . XARELTO 20 MG TABS tablet Take 1 tablet (20 mg total) by mouth daily. 90 tablet 1   No current facility-administered medications for this visit.     Allergies as of 11/20/2017 - Review Complete 11/20/2017  Allergen Reaction Noted  . Naproxen Other (See Comments)   . Penicillins Swelling   . Sulfonamide derivatives Hives   . Aspirin Other (See Comments)   . Captopril Hypertension   . Clindamycin/lincomycin Other (See Comments) 03/17/2013    Family History  Problem Relation Age of Onset  . Prostate cancer Brother   . Cancer Brother  PROSTATE  . Heart disease Mother   . Asthma Mother   . Congestive Heart Failure Mother   . Emphysema  Sister   . COPD Sister   . Stroke Sister   . Heart disease Sister   . Emphysema Brother   . Rheumatologic disease Neg Hx     Social History   Socioeconomic History  . Marital status: Widowed    Spouse name: Not on file  . Number of children: 2  . Years of education: Not on file  . Highest education level: Not on file  Social Needs  . Financial resource strain: Not on file  . Food insecurity - worry: Not on file  . Food insecurity - inability: Not on file  . Transportation needs - medical: Not on file  . Transportation needs - non-medical: Not on file  Occupational History  . Occupation: Retired  Tobacco Use  . Smoking status: Former Smoker    Packs/day: 1.00    Years: 30.00    Pack years: 30.00    Types: Cigarettes    Last attempt to quit: 12/16/1989    Years since quitting: 27.9  . Smokeless tobacco: Never Used  . Tobacco comment: smoked off & on  Substance and Sexual Activity  . Alcohol use: No    Alcohol/week: 0.0 oz  . Drug use: No  . Sexual activity: No  Other Topics Concern  . Not on file  Social History Narrative   Originally from [place]. Always lived in [place]. No international travel. Has prior travel to [place], [place], [place], [place], [place], & [place]. No recent travel. She has a cat currently. No prior bird, mold, or hot tub exposure. 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 No known asbestos exposure. 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     Review of Systems:    Constitutional: No weight loss, fever or chills Skin: No rash  Cardiovascular: No chest pain Respiratory: No SOB  Gastrointestinal: See HPI and otherwise negative   Physical Exam:  Vital signs: BP (!) 158/68   Pulse 86   Ht 5\' 1"  (1.549 m)   Wt 152 lb (68.9 kg)   BMI 28.72 kg/m   Constitutional:   Pleasant Elderly Caucasian female appears to be in NAD, Well developed, Well nourished, alert and cooperative Respiratory:  Respirations even and unlabored. Lungs clear to auscultation bilaterally.   No wheezes, crackles, or rhonchi.  Cardiovascular: Normal S1, S2. No MRG. Regular rate and rhythm. No peripheral edema, cyanosis or pallor.  Gastrointestinal:  Soft, nondistended, nontender. No rebound or guarding. Normal bowel sounds. No appreciable masses or hepatomegaly. Rectal:  External exam: no hemorrhoids, Fissure on lateral aspect of rectum in 3 o'clock position, ttp, no discharge; Internal exam: no stool felt in rectal vault Psychiatric:  Demonstrates good judgement and reason without abnormal affect or behaviors.  RELEVANT LABS AND IMAGING: CBC    Component Value Date/Time   WBC 8.5 11/19/2017 1527   WBC 6.8 06/26/2017 1205   RBC 4.06 11/19/2017 1527   RBC 4.27 06/26/2017 1205   HGB 12.4 11/19/2017 1527   HCT 37.4 11/19/2017 1527   PLT 271 11/19/2017 1527   MCV 92 11/19/2017 1527   MCH 30.5 11/19/2017 1527   MCH 31.1 06/26/2017 1205   MCHC 33.2 11/19/2017 1527   MCHC 33.3 06/26/2017 1205   RDW 13.9 11/19/2017 1527   LYMPHSABS 4.0 (H) 11/19/2017 1527   MONOABS 0.4  07/26/2015 1449   EOSABS 0.2 11/19/2017 1527   BASOSABS 0.1 11/19/2017 1527    CMP     Component Value Date/Time   NA 138 11/19/2017 1527   K 5.0 11/19/2017 1527   CL 98 11/19/2017 1527   CO2 28 11/19/2017 1527   GLUCOSE 83 11/19/2017 1527   GLUCOSE 91 06/26/2017 1205   BUN 12 11/19/2017 1527   CREATININE 0.74 11/19/2017 1527   CREATININE 0.60 08/11/2013 0947   CALCIUM 9.2 11/19/2017 1527   PROT 7.4 11/19/2017 1527   ALBUMIN 4.4 11/19/2017 1527   AST 20 11/19/2017 1527   ALT 13 11/19/2017 1527   ALKPHOS 112 11/19/2017 1527   BILITOT 0.3 11/19/2017 1527   GFRNONAA 77 11/19/2017 1527   GFRNONAA 89 08/11/2013 0947   GFRAA 89 11/19/2017 1527   GFRAA >89 08/11/2013 0947    Assessment: 1.  Constipation: Worse since the patient has been on pain medicines after recent hip procedure, recent impaction last Friday with blood  before and after, fissure on exam 2.  Anal fissure: Seen at time of rectal exam today, patient also describes rectal pain associated with this and bleeding, likely due to recent impaction as above 3.  Rectal bleeding: With above 4.  Chronic GERD: Controlled on Zegerid Plan: 1.  Prescribed Nitroglycerin ointment to be applied 3 times daily with a gloved finger up to the level of the first knuckle, discussed this with the patient. 2.  Recommend sitz baths for 15-20 minutes up to 3 times a day. 3.  Recommend the patient buy over-the-counter recta care cream with lidocaine and apply as needed for pain 4.  Refill patient's Zegerid for a year 5.  Patient was instructed to use MiraLAX twice daily.  She should back off to once daily if she has liquid/loose stools, we discussed titration of this. 6.  Patient to follow in clinic in 6-8 weeks with Dr. Henrene Pastor.  [name], PA-C [facility] 11/20/2017, 3:53 PM  Cc: Chipper Herb, MD

## 2017-11-20 NOTE — Telephone Encounter (Signed)
Left message for [name], no EF% in Epic since 2013. Ask her to call back.

## 2017-11-20 NOTE — Patient Instructions (Signed)
We have sent a prescription for nitroglycerin to [facility]. You should apply a pea size amount to your rectum three times a day for the next 6-8 weeks. Insert up into rectum until the first knuckle of your index finger.  [facility]'s information is below: Address: [address], [place], [place] [address]  [phone]  You can also use over the counter Recticare with lidocaine.   We have refilled your prescription for Zegerid.   Continue Miralax twice a day, if you have loose stools decrease to just once daily.   Try sitz baths 2-3 times daily.  How to Take a Sitz Bath A sitz bath is a warm water bath that is taken while you are sitting down. The water should only come up to your hips and should cover your buttocks. Your health care provider may recommend a sitz bath to help you:  Clean the lower part of your body, including your genital area.  With itching.  With pain.  With sore muscles or muscles that tighten or spasm.  How to take a sitz bath Take 3-4 sitz baths per day or as told by your health care provider. 1. Partially fill a bathtub with warm water. You will only need the water to be deep enough to cover your hips and buttocks when you are sitting in it. 2. If your health care provider told you to put medicine in the water, follow the directions exactly. 3. Sit in the water and open the tub drain a little. 4. Turn on the warm water again to keep the tub at the correct level. Keep the water running constantly. 5. Soak in the water for 15-20 minutes or as told by your health care provider. 6. After the sitz bath, pat the affected area dry first. Do not rub it. 7. Be careful when you stand up after the sitz bath because you may feel dizzy.  Contact a health care provider if:  Your symptoms get worse. Do not continue with sitz baths if your symptoms get worse.  You have new symptoms. Do not continue with sitz baths until you talk with your  health care provider. This information is not intended to replace advice given to you by your health care provider. Make sure you discuss any questions you have with your health care provider. Document Released: 08/24/2004 Document Revised: 05/01/2016 Document Reviewed: 11/30/2014 Elsevier Interactive Patient Education  [employer].

## 2017-11-20 NOTE — Telephone Encounter (Signed)
Dr Laurance Flatten would like pt's last Ejection Fraction please.

## 2017-11-21 LAB — URINE CULTURE

## 2017-11-21 NOTE — Progress Notes (Signed)
Initial assessment and plans reviewed. If she does not tolerate nitroglycerin ointment could try diltiazem 2%`

## 2017-11-27 NOTE — Telephone Encounter (Signed)
Spoke with [place] and she received all information she needed

## 2017-11-28 ENCOUNTER — Other Ambulatory Visit: Payer: Self-pay | Admitting: Family Medicine

## 2017-12-03 ENCOUNTER — Other Ambulatory Visit: Payer: Self-pay | Admitting: Family Medicine

## 2017-12-04 NOTE — Telephone Encounter (Signed)
Last seen 11/19/17  DWM

## 2017-12-11 ENCOUNTER — Other Ambulatory Visit: Payer: Self-pay | Admitting: Family Medicine

## 2017-12-25 ENCOUNTER — Telehealth: Payer: Self-pay | Admitting: Family Medicine

## 2017-12-25 NOTE — Telephone Encounter (Signed)
Melody will inform pt that she will need appt for F2F for wheelchair [facility] uses [employer]  F [phone] and Paschal Dopp [phone]

## 2018-01-06 ENCOUNTER — Ambulatory Visit: Payer: Medicare Other | Admitting: Internal Medicine

## 2018-01-11 ENCOUNTER — Encounter: Payer: Self-pay | Admitting: Cardiology

## 2018-01-11 NOTE — Progress Notes (Deleted)
HPI The patient presents for followup of known coronary disease and atrial fib.  She returns for one year for one year follow up.  I have not seen her since 2017.  Since I last saw her she had a spinal stimulator placed.   ***   Since I last saw her she has done well.  The patient denies any new symptoms such as chest discomfort, neck or arm discomfort. There has been no new shortness of breath, PND or orthopnea. There have been no reported palpitations, presyncope or syncope.  She does have snoring, hypersomnolence during the day and headaches.  She is very fatigued.    She has ankle problems with recurrent surgeries and gets around with a cane.    Allergies  Allergen Reactions  . Naproxen Other (See Comments)    Tongue swelling  . Penicillins Swelling    Swelling around site Has patient had a PCN reaction causing immediate rash, facial/tongue/throat swelling, SOB or lightheadedness with hypotension: Yes Has patient had a PCN reaction causing severe rash involving mucus membranes or skin necrosis: No Has patient had a PCN reaction that required hospitalization: No Has patient had a PCN reaction occurring within the last 10 years: No If all of the above answers are "NO", then may proceed with Cephalosporin use.   . Sulfonamide Derivatives Hives  . Aspirin Other (See Comments)    REACTION: regular strength causes "heart to beat fast"  . Captopril Hypertension  . Clindamycin/Lincomycin Other (See Comments)    unknown    Current Outpatient Medications  Medication Sig Dispense Refill  . acetaminophen (TYLENOL) 650 MG CR tablet Take 650-1,300 mg by mouth every 8 (eight) hours as needed for pain. Depends on pain level if takes 1-2 tablets    . ALPRAZolam (XANAX) 0.5 MG tablet TAKE 2 TABLETS AT BEDTIME 60 tablet 3  . AMBULATORY NON FORMULARY MEDICATION Place 0.125 mg rectally 3 (three) times daily. Medication Name: Nitroglycerin ointment 1 Tube 1  . Ascorbic Acid (VITAMIN C) 1000 MG  tablet Take 1,000 mg by mouth daily.    . benzonatate (TESSALON) 100 MG capsule TAKE 1 CAPSULE TWICE DAILY AS NEEDED FOR COUGH 30 capsule 0  . beta carotene 25000 UNIT capsule Take 25,000 Units by mouth daily.    . budesonide (PULMICORT) 0.5 MG/2ML nebulizer solution Take 2 mLs (0.5 mg total) by nebulization 2 (two) times daily. Dx 496 (Patient taking differently: Take 0.5 mg by nebulization 2 (two) times daily as needed (shortness of breath). Dx 496) 120 mL 6  . Calcium Carbonate-Vitamin D (CALCIUM 600+D) 600-400 MG-UNIT per tablet Take 1 tablet by mouth daily at 6 PM.     . cholecalciferol (VITAMIN D) 1000 UNITS tablet Take 1,000 Units by mouth 2 (two) times daily with breakfast and lunch.     . Coenzyme Q10 (COQ10) 100 MG CAPS Take 100 mg by mouth daily.    Marland Kitchen denosumab (PROLIA) 60 MG/ML SOLN injection Inject 60 mg every 6 (six) months into the skin. Administer in upper arm, thigh, or abdomen 1 mL 0  . dicyclomine (BENTYL) 10 MG capsule TAKE (1) OR (2) CAPSULES EVERY SIX HOURS AS NEEDED FOR SPASM 60 capsule 0  . diltiazem (CARDIZEM CD) 120 MG 24 hr capsule TAKE (1) CAPSULE DAILY (Patient taking differently: Take 120 mg by mouth daily. TAKE (1) CAPSULE DAILY) 90 capsule 2  . fluticasone (FLONASE) 50 MCG/ACT nasal spray 2 SPRAYS IN EACH NOSTRIL ONCE A DAY (Patient taking differently: 2 SPRAYS IN  EACH NOSTRIL ONCE A DAY AS NEEDED FOR ALLERGIES) 16 g 5  . HYDROcodone-acetaminophen (NORCO) 5-325 MG tablet Take 1 tablet by mouth every 4 (four) hours as needed. 90 tablet 0  . levofloxacin (LEVAQUIN) 250 MG tablet Take 1 tablet (250 mg total) by mouth daily. 3 tablet 0  . levothyroxine (SYNTHROID, LEVOTHROID) 25 MCG tablet TAKE 2 TABLETS DAILY AS DIRECTED (Patient taking differently: Take 50 mcg by mouth daily before breakfast. TAKE 2 TABLETS DAILY AS DIRECTED) 180 tablet 3  . Liniments (ANALGESIC EX) Apply 1 application topically 3 (three) times daily as needed (pain).    Marland Kitchen loratadine (CLARITIN) 10 MG  tablet Take 1 tablet (10 mg total) by mouth daily. 90 tablet 3  . meclizine (ANTIVERT) 25 MG tablet TAKE (1) TABLET THREE TIMES DAILY AS NEEDED FOR DIZZINESS. 30 tablet 0  . Melatonin 5 MG TABS Take 10 mg by mouth every evening.    . metoprolol tartrate (LOPRESSOR) 25 MG tablet Take 1-2 tablets (25-50 mg total) by mouth 2 (two) times daily. 2 tablets each morning and 1 tablet each evening 90 tablet 5  . montelukast (SINGULAIR) 10 MG tablet Take 1 tablet (10 mg total) by mouth daily. (Patient taking differently: Take 10 mg by mouth at bedtime. ) 90 tablet 3  . nitroGLYCERIN (NITROSTAT) 0.4 MG SL tablet 1 tablet under the tongue every 5 minutes as needed for chest pain. 25 tablet 1  . omega-3 acid ethyl esters (LOVAZA) 1 g capsule TAKE (2) CAPSULES TWICE DAILY. 360 capsule 1  . omeprazole-sodium bicarbonate (ZEGERID) 40-1100 MG capsule Take 1 capsule by mouth daily. 90 capsule 3  . ondansetron (ZOFRAN) 4 MG tablet TAKE 1 TABLET EVERY 12 HOURS AS NEEDED FOR NAUSEA 20 tablet 0  . PROAIR HFA 108 (90 Base) MCG/ACT inhaler USE 2 PUFFS EVERY 6 HOURS AS NEEDED FOR WHEEZING 8.5 g 0  . rosuvastatin (CRESTOR) 20 MG tablet Take 1 tablet (20 mg total) by mouth daily. 90 tablet 1  . sertraline (ZOLOFT) 100 MG tablet Take 1 tablet (100 mg total) by mouth daily. 90 tablet 0  . tamsulosin (FLOMAX) 0.4 MG CAPS capsule Take 1 capsule (0.4 mg total) by mouth daily after breakfast. 30 capsule 5  . XARELTO 20 MG TABS tablet Take 1 tablet (20 mg total) by mouth daily. 90 tablet 1   No current facility-administered medications for this visit.     Past Medical History:  Diagnosis Date  . Anxiety   . Anxiety disorder   . Arthritis   . Asthmatic bronchitis   . Atrial fibrillation ([place])   . Atrial fibrillation ([place])   . Bowel obstruction ([facility])    blockage  . CAD (coronary artery disease)    Stent to RI 2003.  Myoview 2013 no ischemia.  . Cataract   . Chronic back pain   . Chronic bronchitis ([place])   . Chronic  pain syndrome   . Colon polyp    adenomatous  . Congestive heart disease (H[facility])    Preserved EF  . Depression   . Dysrhythmia   . Encephalitis    d/t meningitis  . Esophageal motility disorder   . Fibromyalgia   . GERD (gastroesophageal reflux disease)   . History of hiatal hernia   . History of kidney stones   . Hyperlipidemia   . Hypertension   . Hypothyroid   . 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 Myocardial infarction ([place])    2000  .  Nephrolithiasis   . OA (osteoarthritis)   . Pleural effusion on right   . Pneumonia   . PONV (postoperative nausea and vomiting)   . Status post dilation of esophageal narrowing   . Vitamin D deficiency     Past Surgical History:  Procedure Laterality Date  . ABDOMINAL HYSTERECTOMY     partial  . ANKLE FUSION  08/27/2012   Procedure: ARTHRODESIS ANKLE;  Surgeon: Wylene Simmer, MD;  Location: [facility];  Service: Orthopedics;  Laterality: Right;  Arthrodesis right ankle and subtalar joint  . APPENDECTOMY    . ARTHRODESIS TIBIOFIBULAR Right 03/31/2014   Procedure: REVISION OF SUBTALOR ARTHRODESIS  RIGHT ;  Surgeon: Wylene Simmer, MD;  Location: [facility];  Service: Orthopedics;  Laterality: Right;  . BACK SURGERY    . BREAST SURGERY     Left breast lump removed  . BRONCHOSCOPY    . CORONARY ANGIOPLASTY WITH STENT PLACEMENT  2003  . DECORTICATION Right 05/03/2014   Procedure: DECORTICATION;  Surgeon: [name], MD;  Location: [facility];  Service: Thoracic;  Laterality: Right;  . EYE SURGERY  2016   cateracts  . FOOT ARTHRODESIS, SUBTALAR Right 2013  . HARDWARE REMOVAL Right 03/31/2014   Procedure: REMOVAL OF DEEP IMPLANTS X 3  RIGHT ;  Surgeon: Wylene Simmer, MD;  Location: [facility];  Service: Orthopedics;  Laterality: Right;  . HARDWARE REMOVAL Right 11/03/2014   Procedure: HARDWARE REMOVAL OF SUBTALAR JOINT;  Surgeon: Wylene Simmer, MD;  Location: [facility];  Service: Orthopedics;  Laterality: Right;  .  HARDWARE REVISION  03/31/2014   SUBTALOR  ARTHRODESIS       DR HEWITT  . JOINT REPLACEMENT     Right knee  . KNEE ARTHROSCOPY  03/26/2012   Procedure: ARTHROSCOPY KNEE;  Surgeon: Wylene Simmer, MD;  Location: [facility];  Service: Orthopedics;  Laterality: Left;  with Debridement of Lateral Meniscus tear  . REMOVAL OF IMPLANT Right 03/31/2014   DR HEWITT  . ROTATOR CUFF REPAIR Bilateral   . SINUS SURGERY WITH INSTATRAK    . SPINAL CORD STIMULATOR INSERTION N/A 07/02/2017   Procedure: LUMBAR SPINAL CORD STIMULATOR INSERTION;  Surgeon: Melina Schools, MD;  Location: [facility];  Service: Orthopedics;  Laterality: N/A;  120 mins  . TOTAL KNEE ARTHROPLASTY Right   . VIDEO ASSISTED THORACOSCOPY (VATS)/EMPYEMA Right 05/03/2014   Procedure: VIDEO ASSISTED THORACOSCOPY (VATS)/EMPYEMA;  Surgeon: [name], MD;  Location: [facility];  Service: Thoracic;  Laterality: Right;  Marland Kitchen VIDEO BRONCHOSCOPY N/A 05/03/2014   Procedure: VIDEO BRONCHOSCOPY;  Surgeon: [name], MD;  Location: [facility];  Service: Thoracic;  Laterality: N/A;    ROS: Reflux.  Otherwise as stated in the HPI and negative for all other systems.  PHYSICAL EXAM There were no vitals taken for this visit.  ***   GENERAL:  Well appearing NECK:  No jugular venous distention, waveform within normal limits, carotid upstroke brisk and symmetric, no bruits, no thyromegaly LUNGS: decreased breath sounds with expiratory wheezes. BACK:  No CVA tenderness HEART:  PMI not displaced or sustained,S1 and S2 within normal limits, no S3, no S4, no clicks, no rubs, no murmurs ABD:  Flat, positive bowel sounds normal in frequency in pitch, no bruits, no rebound, no guarding, no midline pulsatile mass, no hepatomegaly, no splenomegaly EXT:  2 plus pulses throughout, no edema, no cyanosis no clubbing, right ankle swelling.    EKG:  Sinus rhythm, rate 69, axis within normal limits, intervals within normal  limits, no acute ST-T wave changes  01/11/2018  ASSESSMENT AND PLAN  CAD:  ***  The patient has had no new symptoms since a negative stress perfusion in 2013.    No further testing is planned.   ATRIAL FIBRILLATION:   Ms. TORI DATTILIO has a CHA2DS2 - VASc score of 4 with a risk of stroke of 4%.  HTN:  The blood pressure is *** at target. No change in medications is indicated. We will continue with therapeutic lifestyle changes (TLC).  DIASTOLIC HF: ***  She seems to be euvolemic.  No change in therapy is indicated.   SNORING: ***  I will set her up for a sleep study.

## 2018-01-13 ENCOUNTER — Ambulatory Visit: Payer: Medicare Other | Admitting: Cardiology

## 2018-01-20 DIAGNOSIS — G894 Chronic pain syndrome: Secondary | ICD-10-CM | POA: Insufficient documentation

## 2018-01-20 DIAGNOSIS — G8929 Other chronic pain: Secondary | ICD-10-CM | POA: Insufficient documentation

## 2018-01-20 DIAGNOSIS — M545 Low back pain: Secondary | ICD-10-CM

## 2018-01-20 DIAGNOSIS — M79671 Pain in right foot: Secondary | ICD-10-CM | POA: Diagnosis not present

## 2018-01-22 ENCOUNTER — Ambulatory Visit (INDEPENDENT_AMBULATORY_CARE_PROVIDER_SITE_OTHER): Payer: Medicare Other | Admitting: Family Medicine

## 2018-01-22 ENCOUNTER — Encounter: Payer: Self-pay | Admitting: Family Medicine

## 2018-01-22 VITALS — BP 121/65 | HR 63 | Temp 97.6°F | Ht 61.0 in | Wt 152.0 lb

## 2018-01-22 DIAGNOSIS — M79672 Pain in left foot: Secondary | ICD-10-CM

## 2018-01-22 DIAGNOSIS — M21961 Unspecified acquired deformity of right lower leg: Secondary | ICD-10-CM

## 2018-01-22 DIAGNOSIS — R2681 Unsteadiness on feet: Secondary | ICD-10-CM

## 2018-01-22 DIAGNOSIS — M25571 Pain in right ankle and joints of right foot: Secondary | ICD-10-CM

## 2018-01-22 NOTE — Progress Notes (Addendum)
Subjective:    Patient ID: [name], female    DOB: 30-Aug-1936, 82 y.o.   MRN: [id number]  HPI Patient here today for face to face visit for a power wheelchair.  The patient has ongoing problems with her chronic pain in her lower extremities.  She has had multiple orthopedic surgeries.  She also has paroxysmal atrial fibrillation and is followed by the cardiologist.  The patient's biggest issues are her feet and her painful gait.  She also has instability.  The patient is able to do minimal walking even up a slight incline without having severe pain and feeling like she is going to stumble and fall.  She has ongoing gait instability secondary to the right foot deformity and multiple surgeries that she has had.  She is now developing increasing pain in the left foot because of the pressure of the left foot is taking to relieve the right foot.  She is said that no one wants to do any surgery on her but she is considering and going and getting one further opinion in [place] from an orthopedist recommendation in [place].  She does need a motorized wheelchair to stay mobile and to keep her from walking and preventing her from falling so that she might sustain some kind of major fracture.    Patient Active Problem List   Diagnosis Date Noted  . Chronic pain 07/02/2017  . Vocal cord dysfunction 02/04/2017  . Irritable bowel syndrome with diarrhea 12/11/2015  . UTI (urinary tract infection) 08/22/2015  . Cough 07/26/2015  . Diarrhea 11/30/2014  . Heme + stool 11/30/2014  . Chronic anticoagulation 11/30/2014  . CHF (congestive heart failure) ([place]) 04/30/2014  . 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 Osteoporosis 01/27/2014  . Fibromyalgia 08/09/2013  . Asthma 08/01/2010  . Hypothyroidism 03/20/2010  . Depression 03/20/2010  . HTN (hypertension) 03/20/2010  . ESOPHAGEAL MOTILITY DISORDER 03/20/2010  . Hyperlipidemia 04/22/2009  . Cor athrscl-uns vessel 04/22/2009  . ATRIAL  FIBRILLATION, PAROXYSMAL 04/22/2009  . ESOPHAGEAL STRICTURE 10/31/2004  . GERD 10/31/2004  . HIATAL HERNIA 10/06/2001   Outpatient Encounter Medications as of 01/22/2018  Medication Sig  . acetaminophen (TYLENOL) 650 MG CR tablet Take 650-1,300 mg by mouth every 8 (eight) hours as needed for pain. Depends on pain level if takes 1-2 tablets  . ALPRAZolam (XANAX) 0.5 MG tablet TAKE 2 TABLETS AT BEDTIME  . AMBULATORY NON FORMULARY MEDICATION Place 0.125 mg rectally 3 (three) times daily. Medication Name: Nitroglycerin ointment  . Ascorbic Acid (VITAMIN C) 1000 MG tablet Take 1,000 mg by mouth daily.  . benzonatate (TESSALON) 100 MG capsule TAKE 1 CAPSULE TWICE DAILY AS NEEDED FOR COUGH  . beta carotene 25000 UNIT capsule Take 25,000 Units by mouth daily.  . budesonide (PULMICORT) 0.5 MG/2ML nebulizer solution Take 2 mLs (0.5 mg total) by nebulization 2 (two) times daily. Dx 496 (Patient taking differently: Take 0.5 mg by nebulization 2 (two) times daily as needed (shortness of breath). Dx 496)  . Calcium Carbonate-Vitamin D (CALCIUM 600+D) 600-400 MG-UNIT per tablet Take 1 tablet by mouth daily at 6 PM.   . cholecalciferol (VITAMIN D) 1000 UNITS tablet Take 1,000 Units by mouth 2 (two) times daily with breakfast and lunch.   . Coenzyme Q10 (COQ10) 100 MG CAPS Take 100 mg by mouth daily.  Marland Kitchen denosumab (PROLIA) 60 MG/ML SOLN injection Inject 60 mg every 6 (six) months into the skin. Administer in upper arm, thigh, or abdomen  . dicyclomine (BENTYL) 10  MG capsule TAKE (1) OR (2) CAPSULES EVERY SIX HOURS AS NEEDED FOR SPASM  . diltiazem (CARDIZEM CD) 120 MG 24 hr capsule TAKE (1) CAPSULE DAILY (Patient taking differently: Take 120 mg by mouth daily. TAKE (1) CAPSULE DAILY)  . fluticasone (FLONASE) 50 MCG/ACT nasal spray 2 SPRAYS IN EACH NOSTRIL ONCE A DAY (Patient taking differently: 2 SPRAYS IN EACH NOSTRIL ONCE A DAY AS NEEDED FOR ALLERGIES)  . HYDROcodone-acetaminophen (NORCO) 5-325 MG tablet Take 1  tablet by mouth every 4 (four) hours as needed.  Marland Kitchen levothyroxine (SYNTHROID, LEVOTHROID) 25 MCG tablet TAKE 2 TABLETS DAILY AS DIRECTED (Patient taking differently: Take 50 mcg by mouth daily before breakfast. TAKE 2 TABLETS DAILY AS DIRECTED)  . Liniments (ANALGESIC EX) Apply 1 application topically 3 (three) times daily as needed (pain).  Marland Kitchen loratadine (CLARITIN) 10 MG tablet Take 1 tablet (10 mg total) by mouth daily.  . meclizine (ANTIVERT) 25 MG tablet TAKE (1) TABLET THREE TIMES DAILY AS NEEDED FOR DIZZINESS.  . Melatonin 5 MG TABS Take 10 mg by mouth every evening.  . metoprolol tartrate (LOPRESSOR) 25 MG tablet Take 1-2 tablets (25-50 mg total) by mouth 2 (two) times daily. 2 tablets each morning and 1 tablet each evening  . montelukast (SINGULAIR) 10 MG tablet Take 1 tablet (10 mg total) by mouth daily. (Patient taking differently: Take 10 mg by mouth at bedtime. )  . omega-3 acid ethyl esters (LOVAZA) 1 g capsule TAKE (2) CAPSULES TWICE DAILY.  Marland Kitchen omeprazole-sodium bicarbonate (ZEGERID) 40-1100 MG capsule Take 1 capsule by mouth daily.  . ondansetron (ZOFRAN) 4 MG tablet TAKE 1 TABLET EVERY 12 HOURS AS NEEDED FOR NAUSEA  . PROAIR HFA 108 (90 Base) MCG/ACT inhaler USE 2 PUFFS EVERY 6 HOURS AS NEEDED FOR WHEEZING  . rosuvastatin (CRESTOR) 20 MG tablet Take 1 tablet (20 mg total) by mouth daily.  . sertraline (ZOLOFT) 100 MG tablet Take 1 tablet (100 mg total) by mouth daily.  . tamsulosin (FLOMAX) 0.4 MG CAPS capsule Take 1 capsule (0.4 mg total) by mouth daily after breakfast.  . XARELTO 20 MG TABS tablet Take 1 tablet (20 mg total) by mouth daily.  . [DISCONTINUED] levofloxacin (LEVAQUIN) 250 MG tablet Take 1 tablet (250 mg total) by mouth daily.  . nitroGLYCERIN (NITROSTAT) 0.4 MG SL tablet 1 tablet under the tongue every 5 minutes as needed for chest pain. (Patient not taking: Reported on 01/22/2018)   No facility-administered encounter medications on file as of 01/22/2018.       Review  of Systems  HENT: Negative.   Eyes: Negative.   Respiratory: Negative.   Cardiovascular: Positive for leg swelling (bilateral lower extremity ).  Gastrointestinal: Negative.   Endocrine: Negative.   Genitourinary: Negative.   Musculoskeletal: Positive for arthralgias (right foot pain and twists inward) and back pain (has stimulator (pain mgmt) // cyst inside spinal cord).  Skin: Negative.   Allergic/Immunologic: Negative.   Neurological: Positive for weakness and numbness (in right foot).  Hematological: Negative.   Psychiatric/Behavioral: Negative.        Objective:   Physical Exam  Constitutional: She is oriented to person, place, and time. She appears well-developed and well-nourished. No distress.  HENT:  Head: Normocephalic and atraumatic.  Eyes: Conjunctivae and EOM are normal. Pupils are equal, round, and reactive to light. Right eye exhibits no discharge. Left eye exhibits no discharge. No scleral icterus.  Neck: Normal range of motion.  Musculoskeletal: She exhibits tenderness and deformity. She exhibits no edema.  The right ankle is rigid.  With any kind of movement there is pain.  The foot is deviated inward. Because of weakness in both lower extremities with the right being worse than the left but with increasing problems with the left she has a difficult problem with shifting her weight from one side to the other.  She has very limited use of the right lower extremity due to the inward deformity of the foot and with score strength wise only a 1 out of 5.  On the left lower leg it is also somewhat weak and she would score a 2 out of 5.  Both the right arm and left arm are weak but she gets a 2-3 out of 5 on both with strength.  She has a tremendous amount of pain in both lower extremities with the right being worse than the left but would score a 9 out of 10 with 10 being the worst on the right and an 8 out of 10 with 10 being worst on the left lower extremity.  As far as pain  is concerned.  Range of motion wise she is quite limited with the right lower extremity and because of the fixation in her joint she has limited mobility and may be only a 10% range of motion in the right lower extremity.  She has a lot of pain and stiffness in the left lower extremity ankle and would score a 20% range of motion in this extremity.  She can take only a very few steps without her cane.  She has to shuffle and is somewhat ataxic when she walks because of the inward pronation of the right foot and the persistent stiffness secondary to the fixation of the ankle joint.  The gait instability and the rigidity in the joint causes her to fall frequently.  She also has problems with limited shoulder use and this makes using a wheelchair hard to maneuver.  Neurological: She is alert and oriented to person, place, and time.  Skin: Skin is warm and dry. No rash noted.  Psychiatric: She has a normal mood and affect. Her behavior is normal. Judgment and thought content normal.  Nursing note and vitals reviewed.   BP 121/65 (BP Location: Left Arm)   Pulse 63   Temp 97.6 F (36.4 C) (Oral)   Ht 5\' 1"  (1.549 m)   Wt 152 lb (68.9 kg)   BMI 28.72 kg/m        Assessment & Plan:  1. Deformity of right foot -Secondary to multiple surgeries  2. Right ankle pain, unspecified chronicity -Secondary to multiple surgeries  3. Left foot pain -Arthritis and decreased range of motion  4. Gait instability -Ongoing issue because of both lower foot problems. Patient Instructions  Would highly recommend a motorized wheelchair to keep the patient from falling because of ongoing gait instability and the fact that orthopedic surgeons are reluctant to do any further surgery.  Arrie Senate MD

## 2018-01-22 NOTE — Patient Instructions (Signed)
Would highly recommend a motorized wheelchair to keep the patient from falling because of ongoing gait instability and the fact that orthopedic surgeons are reluctant to do any further surgery.

## 2018-01-29 ENCOUNTER — Ambulatory Visit: Payer: Medicare Other | Admitting: Physician Assistant

## 2018-02-02 ENCOUNTER — Ambulatory Visit: Payer: Medicare Other

## 2018-02-03 ENCOUNTER — Ambulatory Visit: Payer: Medicare Other

## 2018-02-06 ENCOUNTER — Ambulatory Visit: Payer: Medicare Other | Admitting: Internal Medicine

## 2018-02-10 ENCOUNTER — Other Ambulatory Visit: Payer: Self-pay | Admitting: Family Medicine

## 2018-02-12 ENCOUNTER — Other Ambulatory Visit: Payer: Self-pay | Admitting: Cardiology

## 2018-02-12 NOTE — Telephone Encounter (Signed)
Rx(s) sent to pharmacy electronically.  

## 2018-02-13 ENCOUNTER — Other Ambulatory Visit: Payer: Self-pay | Admitting: Family Medicine

## 2018-02-13 ENCOUNTER — Other Ambulatory Visit: Payer: Self-pay | Admitting: Cardiology

## 2018-02-16 ENCOUNTER — Other Ambulatory Visit: Payer: Self-pay | Admitting: Family Medicine

## 2018-02-16 NOTE — Telephone Encounter (Signed)
Rx(s) sent to pharmacy electronically.  

## 2018-02-17 DIAGNOSIS — M216X1 Other acquired deformities of right foot: Secondary | ICD-10-CM | POA: Diagnosis not present

## 2018-02-17 DIAGNOSIS — M79671 Pain in right foot: Secondary | ICD-10-CM | POA: Diagnosis not present

## 2018-02-17 DIAGNOSIS — Q661 Congenital talipes calcaneovarus: Secondary | ICD-10-CM | POA: Diagnosis not present

## 2018-02-18 NOTE — Progress Notes (Signed)
Cardiology Office Note   Date:  02/19/2018   ID:  [name], [name] May 08, 1936, MRN [id number]  PCP:  Chipper Herb, MD  Cardiologist:   No primary care provider on file.   Chief Complaint  Patient presents with  . Atrial Fibrillation      History of Present Illness: [name] is a 82 y.o. female who presents for follow up of CAD.  She has PAF as well.   Since I last saw her she is had no new complaints.  She got a back stimulator but it did not help her back.  She is very limited by back problems and in particular by a very damaged right foot from chronic arthritic changes.  She gets around very slowly with a cane.  She thinks she might of had atrial fibrillation 3 or 4 times since I saw her and may be one episode lasted for about an hour.  She took a nitroglycerin.  However, she says she cannot really tell when she is in atrial fibrillation.  She has not had any presyncope or syncope.  She denies any new chest pressure, neck or arm discomfort.  Is not had any new shortness of breath, PND or orthopnea.  Of note she does complain predominantly of fatigue.  That is her biggest issue.  She has significant snoring and daytime somnolence and I wanted to send her for a sleep study in the past but she never had this   Past Medical History:  Diagnosis Date  . Anxiety   . Arthritis   . Asthmatic bronchitis   . Atrial fibrillation ([place])   . Bowel obstruction (H[facility])    blockage  . CAD (coronary artery disease)    Stent to RI 2003.  Myoview 2013 no ischemia.  . Cataract   . Chronic back pain   . Chronic bronchitis ([place])   . Colon polyp    adenomatous  . Congestive heart disease (HCC)    Preserved EF  . Depression   . Esophageal motility disorder   . Fibromyalgia   . GERD (gastroesophageal reflux disease)   . History of hiatal hernia   . History of kidney stones   . Hyperlipidemia   . Hypertension   . Hypothyroid   . 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 Myocardial infarction ([place])    2000  . Nephrolithiasis   . OA (osteoarthritis)   . Status post dilation of esophageal narrowing     Past Surgical History:  Procedure Laterality Date  . ABDOMINAL HYSTERECTOMY     partial  . ANKLE FUSION  08/27/2012   Procedure: ARTHRODESIS ANKLE;  Surgeon: Wylene Simmer, MD;  Location: [facility];  Service: Orthopedics;  Laterality: Right;  Arthrodesis right ankle and subtalar joint  . APPENDECTOMY    . ARTHRODESIS TIBIOFIBULAR Right 03/31/2014   Procedure: REVISION OF SUBTALOR ARTHRODESIS  RIGHT ;  Surgeon: Wylene Simmer, MD;  Location: [facility];  Service: Orthopedics;  Laterality: Right;  . BACK SURGERY    . BREAST SURGERY     Left breast lump removed  . BRONCHOSCOPY    . CORONARY ANGIOPLASTY WITH STENT PLACEMENT  2003  . DECORTICATION Right 05/03/2014   Procedure: DECORTICATION;  Surgeon: [name], MD;  Location: [facility];  Service: Thoracic;  Laterality: Right;  . EYE SURGERY  2016   cateracts  . FOOT ARTHRODESIS, SUBTALAR Right 2013  . HARDWARE REMOVAL Right 03/31/2014  Procedure: REMOVAL OF DEEP IMPLANTS X 3  RIGHT ;  Surgeon: Wylene Simmer, MD;  Location: [facility];  Service: Orthopedics;  Laterality: Right;  . HARDWARE REMOVAL Right 11/03/2014   Procedure: HARDWARE REMOVAL OF SUBTALAR JOINT;  Surgeon: Wylene Simmer, MD;  Location: [facility];  Service: Orthopedics;  Laterality: Right;  . HARDWARE REVISION  03/31/2014   SUBTALOR  ARTHRODESIS       DR HEWITT  . JOINT REPLACEMENT     Right knee  . KNEE ARTHROSCOPY  03/26/2012   Procedure: ARTHROSCOPY KNEE;  Surgeon: Wylene Simmer, MD;  Location: [facility];  Service: Orthopedics;  Laterality: Left;  with Debridement of Lateral Meniscus tear  . REMOVAL OF IMPLANT Right 03/31/2014   DR HEWITT  . ROTATOR CUFF REPAIR Bilateral   . SINUS SURGERY WITH INSTATRAK    . SPINAL CORD STIMULATOR INSERTION N/A 07/02/2017   Procedure: LUMBAR SPINAL CORD STIMULATOR INSERTION;  Surgeon: Melina Schools,  MD;  Location: [facility];  Service: Orthopedics;  Laterality: N/A;  120 mins  . TOTAL KNEE ARTHROPLASTY Right   . VIDEO ASSISTED THORACOSCOPY (VATS)/EMPYEMA Right 05/03/2014   Procedure: VIDEO ASSISTED THORACOSCOPY (VATS)/EMPYEMA;  Surgeon: [name], MD;  Location: [facility];  Service: Thoracic;  Laterality: Right;  Marland Kitchen VIDEO BRONCHOSCOPY N/A 05/03/2014   Procedure: VIDEO BRONCHOSCOPY;  Surgeon: [name], MD;  Location: [facility] OR;  Service: Thoracic;  Laterality: N/A;     Current Outpatient Medications  Medication Sig Dispense Refill  . acetaminophen (TYLENOL) 650 MG CR tablet Take 650-1,300 mg by mouth every 8 (eight) hours as needed for pain. Depends on pain level if takes 1-2 tablets    . ALPRAZolam (XANAX) 0.5 MG tablet TAKE 2 TABLETS AT BEDTIME 60 tablet 3  . Ascorbic Acid (VITAMIN C) 1000 MG tablet Take 1,000 mg by mouth daily.    . beta carotene 25000 UNIT capsule Take 25,000 Units by mouth daily.    . budesonide (PULMICORT) 0.5 MG/2ML nebulizer solution Take 2 mLs (0.5 mg total) by nebulization 2 (two) times daily. Dx 496 (Patient taking differently: Take 0.5 mg by nebulization 2 (two) times daily as needed (shortness of breath). Dx 496) 120 mL 6  . Calcium Carbonate-Vitamin D (CALCIUM 600+D) 600-400 MG-UNIT per tablet Take 1 tablet by mouth daily at 6 PM.     . cholecalciferol (VITAMIN D) 1000 UNITS tablet Take 1,000 Units by mouth 2 (two) times daily with breakfast and lunch.     . Coenzyme Q10 (COQ10) 100 MG CAPS Take 100 mg by mouth daily.    Marland Kitchen denosumab (PROLIA) 60 MG/ML SOLN injection Inject 60 mg every 6 (six) months into the skin. Administer in upper arm, thigh, or abdomen 1 mL 0  . dicyclomine (BENTYL) 10 MG capsule TAKE (1) OR (2) CAPSULES EVERY SIX HOURS AS NEEDED FOR SPASM 60 capsule 2  . diltiazem (CARDIZEM CD) 120 MG 24 hr capsule TAKE (1) CAPSULE DAILY 30 capsule 1  . fluticasone (FLONASE) 50 MCG/ACT nasal spray 2 SPRAYS IN EACH NOSTRIL ONCE A DAY (Patient taking  differently: 2 SPRAYS IN EACH NOSTRIL ONCE A DAY AS NEEDED FOR ALLERGIES) 16 g 5  . HYDROcodone-acetaminophen (NORCO) 5-325 MG tablet Take 1 tablet by mouth every 4 (four) hours as needed. 90 tablet 0  . levothyroxine (SYNTHROID, LEVOTHROID) 25 MCG tablet TAKE 2 TABLETS DAILY AS DIRECTED 180 tablet 2  . Liniments (ANALGESIC EX) Apply 1 application topically 3 (three) times daily as needed (pain).    Marland Kitchen  loratadine (CLARITIN) 10 MG tablet Take 1 tablet (10 mg total) by mouth daily. 90 tablet 0  . meclizine (ANTIVERT) 25 MG tablet TAKE (1) TABLET THREE TIMES DAILY AS NEEDED FOR DIZZINESS. 30 tablet 0  . Melatonin 5 MG TABS Take 10 mg by mouth every evening.    . metoprolol tartrate (LOPRESSOR) 25 MG tablet Take 2 tablets each morning and 1 tablet each evening 90 tablet 2  . montelukast (SINGULAIR) 10 MG tablet Take 1 tablet (10 mg total) by mouth daily. 90 tablet 0  . omega-3 acid ethyl esters (LOVAZA) 1 g capsule TAKE (2) CAPSULES TWICE DAILY. 360 capsule 0  . omeprazole-sodium bicarbonate (ZEGERID) 40-1100 MG capsule Take 1 capsule by mouth daily. 90 capsule 3  . PROAIR HFA 108 (90 Base) MCG/ACT inhaler USE 2 PUFFS EVERY 6 HOURS AS NEEDED FOR WHEEZING 8.5 g 0  . rosuvastatin (CRESTOR) 20 MG tablet Take 1 tablet (20 mg total) by mouth daily. 90 tablet 0  . sertraline (ZOLOFT) 100 MG tablet Take 1 tablet (100 mg total) by mouth daily. 90 tablet 0  . tamsulosin (FLOMAX) 0.4 MG CAPS capsule Take 1 capsule (0.4 mg total) by mouth daily after breakfast. 30 capsule 5  . XARELTO 20 MG TABS tablet Take 1 tablet (20 mg total) by mouth daily. 90 tablet 0  . AMBULATORY NON FORMULARY MEDICATION Place 0.125 mg rectally 3 (three) times daily. Medication Name: Nitroglycerin ointment (Patient not taking: Reported on 02/19/2018) 1 Tube 1  . benzonatate (TESSALON) 100 MG capsule TAKE 1 CAPSULE TWICE DAILY AS NEEDED FOR COUGH (Patient not taking: Reported on 02/19/2018) 30 capsule 0  . nitroGLYCERIN (NITROSTAT) 0.4 MG SL  tablet 1 tablet under the tongue every 5 minutes as needed for chest pain. (Patient not taking: Reported on 02/19/2018) 25 tablet 1  . ondansetron (ZOFRAN) 4 MG tablet TAKE 1 TABLET EVERY 12 HOURS AS NEEDED FOR NAUSEA (Patient not taking: Reported on 02/19/2018) 20 tablet 0   No current facility-administered medications for this visit.     Allergies:   Naproxen; Penicillins; Sulfonamide derivatives; Aspirin; Captopril; and Clindamycin/lincomycin    ROS:  Please see the history of present illness.   Otherwise, review of systems are positive for none.   All other systems are reviewed and negative.    PHYSICAL EXAM: VS:  BP 130/70 (BP Location: Right Arm, Patient Position: Sitting, Cuff Size: Normal)   Pulse 61   Ht 5\' 1"  (1.549 m)   Wt 157 lb 3.2 oz (71.3 kg)   BMI 29.70 kg/m  , BMI Body mass index is 29.7 kg/m. GENERAL:  Well appearing NECK:  No jugular venous distention, waveform within normal limits, carotid upstroke brisk and symmetric, no bruits, no thyromegaly LUNGS:  Clear to auscultation bilaterally BACK:  No CVA tenderness CHEST:  Unremarkable HEART:  PMI not displaced or sustained,S1 and S2 within normal limits, no S3, no S4, no clicks, no rubs, no murmurs ABD:  Flat, positive bowel sounds normal in frequency in pitch, no bruits, no rebound, no guarding, no midline pulsatile mass, no hepatomegaly, no splenomegaly EXT:  2 plus pulses throughout, no edema, no cyanosis no clubbing    EKG:  EKG is ordered today. The ekg ordered today demonstrates sinus rhythm, rate 61, axis within normal limits, intervals within normal limits, no acute ST-T wave changes.   Recent Labs: 11/19/2017: ALT 13; BUN 12; Creatinine, Ser 0.74; Hemoglobin 12.4; Platelets 271; Potassium 5.0; Sodium 138; TSH 2.250    Lipid Panel  Component Value Date/Time   CHOL 129 11/19/2017 1527   CHOL 124 08/11/2013 0947   TRIG 93 11/19/2017 1527   TRIG 127 03/07/2016 1142   TRIG 132 08/11/2013 0947   HDL 52  11/19/2017 1527   HDL 66 03/07/2016 1142   HDL 54 08/11/2013 0947   CHOLHDL 2.5 11/19/2017 1527   CHOLHDL 3.1 05/04/2009 0424   VLDL 20 05/04/2009 0424   LDLCALC 58 11/19/2017 1527   LDLCALC 72 09/20/2014 1200   LDLCALC 44 08/11/2013 0947      Wt Readings from Last 3 Encounters:  02/19/18 157 lb 3.2 oz (71.3 kg)  01/22/18 152 lb (68.9 kg)  11/20/17 152 lb (68.9 kg)      Other studies Reviewed: Additional studies/ records that were reviewed today include:  Labs. Review of the above records demonstrates:  Please see elsewhere in the note.     ASSESSMENT AND PLAN:  CAD:  The patient has no new sypmtoms.  No further cardiovascular testing is indicated.  We will continue with aggressive risk reduction and meds as listed.  She does not have any other symptoms that she had at the time of her catheterization and PCI.  He had a negative stress test in 2013.   ATRIAL FIBRILLATION:  [name] has a CHA2DS2 - VASc score of 4 with a risk of stroke of 4%. No change in therapy.   HTN:  The blood pressure is at target.  No change in therapy.   DIASTOLIC HF: She seems to be euvolemic.  No change in therapy.   SNORING: She finally agrees to have a sleep study.    Current medicines are reviewed at length with the patient today.  The patient does not have concerns regarding medicines.  The following changes have been made:  no change  Labs/ tests ordered today include:    Orders Placed This Encounter  Procedures  . EKG 12-Lead  . Split night study     Disposition:   FU with me in one year.     Signed, Minus Breeding, MD  02/19/2018 10:55 AM    [facility]

## 2018-02-19 ENCOUNTER — Telehealth: Payer: Self-pay | Admitting: *Deleted

## 2018-02-19 ENCOUNTER — Ambulatory Visit (INDEPENDENT_AMBULATORY_CARE_PROVIDER_SITE_OTHER): Payer: Medicare Other | Admitting: Cardiology

## 2018-02-19 ENCOUNTER — Encounter: Payer: Self-pay | Admitting: Cardiology

## 2018-02-19 VITALS — BP 130/70 | HR 61 | Ht 61.0 in | Wt 157.2 lb

## 2018-02-19 DIAGNOSIS — I480 Paroxysmal atrial fibrillation: Secondary | ICD-10-CM | POA: Diagnosis not present

## 2018-02-19 DIAGNOSIS — R5383 Other fatigue: Secondary | ICD-10-CM

## 2018-02-19 DIAGNOSIS — I2510 Atherosclerotic heart disease of native coronary artery without angina pectoris: Secondary | ICD-10-CM

## 2018-02-19 DIAGNOSIS — R0683 Snoring: Secondary | ICD-10-CM | POA: Insufficient documentation

## 2018-02-19 NOTE — Telephone Encounter (Signed)
-----   Message from Lawana Pai sent at 02/19/2018 10:59 AM EST ----- Regarding: sleep study [name] has ordered a sleep study for this patient.

## 2018-02-19 NOTE — Patient Instructions (Signed)
Medication Instructions:  Continue current medications  If you need a refill on your cardiac medications before your next appointment, please call your pharmacy.  Labwork: None Ordered  Testing/Procedures: Your physician has recommended that you have a sleep study. This test records several body functions during sleep, including: brain activity, eye movement, oxygen and carbon dioxide blood levels, heart rate and rhythm, breathing rate and rhythm, the flow of air through your mouth and nose, snoring, body muscle movements, and chest and belly movement.  Follow-Up: Your physician wants you to follow-up in: 1 Year. You should receive a reminder letter in the mail two months in advance. If you do not receive a letter, please call our office [phone].     Thank you for choosing [facility] at [facility]!!

## 2018-02-25 NOTE — Telephone Encounter (Signed)
Patient notified of sleep study appointment date and time. She states that she has a dental appointment the same day and it will be hard for he to get to [place] twice in one day. [place] contact information given to patient to reschedule appointment.

## 2018-03-02 ENCOUNTER — Other Ambulatory Visit: Payer: Self-pay | Admitting: Family Medicine

## 2018-03-03 ENCOUNTER — Encounter (HOSPITAL_BASED_OUTPATIENT_CLINIC_OR_DEPARTMENT_OTHER): Payer: Medicare Other

## 2018-03-10 DIAGNOSIS — H04123 Dry eye syndrome of bilateral lacrimal glands: Secondary | ICD-10-CM | POA: Diagnosis not present

## 2018-03-10 DIAGNOSIS — H5712 Ocular pain, left eye: Secondary | ICD-10-CM | POA: Diagnosis not present

## 2018-03-10 DIAGNOSIS — H43393 Other vitreous opacities, bilateral: Secondary | ICD-10-CM | POA: Diagnosis not present

## 2018-03-10 DIAGNOSIS — G500 Trigeminal neuralgia: Secondary | ICD-10-CM | POA: Diagnosis not present

## 2018-03-10 DIAGNOSIS — Z961 Presence of intraocular lens: Secondary | ICD-10-CM | POA: Diagnosis not present

## 2018-03-11 ENCOUNTER — Ambulatory Visit: Payer: Medicare Other | Admitting: Internal Medicine

## 2018-03-17 ENCOUNTER — Ambulatory Visit (HOSPITAL_BASED_OUTPATIENT_CLINIC_OR_DEPARTMENT_OTHER): Payer: Medicare Other | Attending: Cardiology | Admitting: Cardiovascular Disease

## 2018-03-17 VITALS — Ht 61.0 in | Wt 150.0 lb

## 2018-03-17 DIAGNOSIS — G4761 Periodic limb movement disorder: Secondary | ICD-10-CM | POA: Insufficient documentation

## 2018-03-17 DIAGNOSIS — G4736 Sleep related hypoventilation in conditions classified elsewhere: Secondary | ICD-10-CM | POA: Insufficient documentation

## 2018-03-17 DIAGNOSIS — R0683 Snoring: Secondary | ICD-10-CM | POA: Insufficient documentation

## 2018-03-17 DIAGNOSIS — R5383 Other fatigue: Secondary | ICD-10-CM | POA: Insufficient documentation

## 2018-03-17 DIAGNOSIS — G4763 Sleep related bruxism: Secondary | ICD-10-CM | POA: Diagnosis not present

## 2018-03-17 DIAGNOSIS — Z7901 Long term (current) use of anticoagulants: Secondary | ICD-10-CM | POA: Insufficient documentation

## 2018-03-17 DIAGNOSIS — Z79899 Other long term (current) drug therapy: Secondary | ICD-10-CM | POA: Diagnosis not present

## 2018-03-17 DIAGNOSIS — Z7951 Long term (current) use of inhaled steroids: Secondary | ICD-10-CM | POA: Insufficient documentation

## 2018-03-19 ENCOUNTER — Telehealth: Payer: Self-pay | Admitting: Cardiology

## 2018-03-19 NOTE — Telephone Encounter (Signed)
New Message    [name] is calling from [facility] on behalf of patient. He states that the patient is complaining of being fatigue and not getting enough rest. As well as restless leg syndrome. Geno request that the patient be contacted directly. Please call to discuss.

## 2018-03-19 NOTE — Telephone Encounter (Signed)
Returned call to patient she stated she recently had sleep study.Technician told her she did not have sleep apnea.She told her, her legs moved all night.Stated she is tired,no energy,sob with exertion.No chest pain. She wanted to ask Dr.Hochrein if he recommended any other test to see why she stays tired.Message sent to [place] for advice.

## 2018-03-20 NOTE — Telephone Encounter (Signed)
Returned call to patient Dr.Hochrein does not have sleep study results.Advised I will send message to his CMA.

## 2018-03-20 NOTE — Telephone Encounter (Signed)
Spoke with Barry Brunner sleep study coordinator, she stated sleep study in still in Dr Claiborne Billings inbox to be read, advised pt will call with result when result in available

## 2018-03-20 NOTE — Telephone Encounter (Signed)
Please tell her I do not see the results of the sleep test so I cannot comment.  Please check to see if the test results are available.

## 2018-03-27 ENCOUNTER — Other Ambulatory Visit: Payer: Self-pay | Admitting: Family Medicine

## 2018-03-27 NOTE — Telephone Encounter (Signed)
Last seen 01/22/18  [facility] PCP

## 2018-04-01 ENCOUNTER — Ambulatory Visit: Payer: Medicare Other | Admitting: Family Medicine

## 2018-04-07 ENCOUNTER — Encounter: Payer: Self-pay | Admitting: Family Medicine

## 2018-04-07 ENCOUNTER — Ambulatory Visit (INDEPENDENT_AMBULATORY_CARE_PROVIDER_SITE_OTHER): Payer: Medicare Other | Admitting: Family Medicine

## 2018-04-07 ENCOUNTER — Telehealth: Payer: Self-pay | Admitting: *Deleted

## 2018-04-07 VITALS — BP 132/67 | HR 59 | Temp 97.3°F | Ht 61.0 in | Wt 150.0 lb

## 2018-04-07 DIAGNOSIS — M797 Fibromyalgia: Secondary | ICD-10-CM | POA: Diagnosis not present

## 2018-04-07 DIAGNOSIS — R5382 Chronic fatigue, unspecified: Secondary | ICD-10-CM | POA: Diagnosis not present

## 2018-04-07 DIAGNOSIS — R2681 Unsteadiness on feet: Secondary | ICD-10-CM

## 2018-04-07 DIAGNOSIS — E559 Vitamin D deficiency, unspecified: Secondary | ICD-10-CM | POA: Diagnosis not present

## 2018-04-07 DIAGNOSIS — I5032 Chronic diastolic (congestive) heart failure: Secondary | ICD-10-CM | POA: Diagnosis not present

## 2018-04-07 DIAGNOSIS — G894 Chronic pain syndrome: Secondary | ICD-10-CM

## 2018-04-07 DIAGNOSIS — I480 Paroxysmal atrial fibrillation: Secondary | ICD-10-CM | POA: Diagnosis not present

## 2018-04-07 DIAGNOSIS — K219 Gastro-esophageal reflux disease without esophagitis: Secondary | ICD-10-CM | POA: Diagnosis not present

## 2018-04-07 DIAGNOSIS — E7800 Pure hypercholesterolemia, unspecified: Secondary | ICD-10-CM | POA: Diagnosis not present

## 2018-04-07 DIAGNOSIS — I10 Essential (primary) hypertension: Secondary | ICD-10-CM | POA: Diagnosis not present

## 2018-04-07 DIAGNOSIS — R5383 Other fatigue: Secondary | ICD-10-CM | POA: Diagnosis not present

## 2018-04-07 MED ORDER — ALPRAZOLAM 0.5 MG PO TABS: 1.0000 mg | ORAL_TABLET | Freq: Every day | ORAL | 5 refills | Status: DC

## 2018-04-07 NOTE — Patient Instructions (Addendum)
Medicare Annual Wellness Visit  [place] and the medical providers at [facility] strive to bring you the best medical care.  In doing so we not only want to address your current medical conditions and concerns but also to detect new conditions early and prevent illness, disease and health-related problems.    Medicare offers a yearly Wellness Visit which allows our clinical staff to assess your need for preventative services including immunizations, lifestyle education, counseling to decrease risk of preventable diseases and screening for fall risk and other medical concerns.    This visit is provided free of charge (no copay) for all Medicare recipients. The clinical pharmacists at [facility] have begun to conduct these Wellness Visits which will also include a thorough review of all your medications.    As you primary medical provider recommend that you make an appointment for your Annual Wellness Visit if you have not done so already this year.  You may set up this appointment before you leave today or you may call back ([phone]) and schedule an appointment.  Please make sure when you call that you mention that you are scheduling your Annual Wellness Visit with the clinical pharmacist so that the appointment may be made for the proper length of time.     Continue current medications. Continue good therapeutic lifestyle changes which include good diet and exercise. Fall precautions discussed with patient. If an FOBT was given today- please return it to our front desk. If you are over 33 years old - you may need Prevnar 55 or the adult Pneumonia vaccine.  **Flu shots are available--- please call and schedule a FLU-CLINIC appointment**  After your visit with [place] today you will receive a survey in the mail or online from [employer] regarding your care with [place]. Please take a moment to fill this out. Your feedback is very  important to [place] as you can help [place] better understand your patient needs as well as improve your experience and satisfaction. WE CARE ABOUT YOU!!!   Follow-up with orthopedist as planned The skin lesions that you pointed out to me under the right arm and to the side of the right breast all appear benign and more like seborrheic keratoses along with one skin tag and we will continue to monitor those.  If any or one of them become irritated we can always do cryotherapy. Continue to follow-up with cardiology as planned Check with cardiology regarding the results of your sleep study which the report has been delayed for some reason.

## 2018-04-07 NOTE — Telephone Encounter (Signed)
Returned a call to patient. She called to ask for sleep study results. Informed her that her study is in Dr Evette Georges box waiting to be read. Once it has been read I will call her with the results and his recommendations.

## 2018-04-07 NOTE — Progress Notes (Signed)
Subjective:    Patient ID: [name], female    DOB: 06-Aug-1936, 82 y.o.   MRN: [id number]  HPI Pt here for follow up and management of chronic medical problems which includes hypertension and hyperlipidemia. She is taking medication regularly.  Continues to be followed by multiple physicians for her multiple ailments including atrial fibrillation congestive heart failure fibromyalgia hypertension hyperlipidemia and hypothyroidism.  She has chronic ongoing pain because of failed surgery on her feet and is not able to walk hardly at all and uses a assistive device for walking but is very concerned that she might be falling.  She is applying for a motorized wheelchair and so far the things are moving forward with that.  She is also had a recent sleep study and I am not sure of the results of the sleep study.  She does have several skin lesions she would like for [place] to look at.  Other complaint that the patient has today is just fatigue and it is much worse when she gets up in the morning.  She has not gotten results of the sleep study yet that was done in early April.  She denies any chest pain and no more shortness of breath than usual.  She denies any trouble with her stomach including nausea vomiting diarrhea blood in the stool or black tarry bowel movements or trouble with passing her water.  She is in a wheelchair today.  She has seen an orthopedic specialist in [place] and they are looking at her case in deciding if they can do surgery that could help her.  This would also have to be reviewed by the cardiologist to make sure there is no cardiac issues involved with doing this major surgery.  The patient brings a record from [facility] orthopedist that was dated 03/24/2017 and this has to do with findings during that visit and was an MRI of the thoracic spine with and without contrast and it will be scanned into her record.     Patient Active Problem List   Diagnosis Date Noted  . Snoring  02/19/2018  . Other fatigue 02/19/2018  . Chronic pain 07/02/2017  . Vocal cord dysfunction 02/04/2017  . Irritable bowel syndrome with diarrhea 12/11/2015  . UTI (urinary tract infection) 08/22/2015  . Cough 07/26/2015  . Diarrhea 11/30/2014  . Heme + stool 11/30/2014  . Chronic anticoagulation 11/30/2014  . CHF (congestive heart failure) ([place]) 04/30/2014  . 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 Osteoporosis 01/27/2014  . Fibromyalgia 08/09/2013  . Asthma 08/01/2010  . Hypothyroidism 03/20/2010  . Depression 03/20/2010  . HTN (hypertension) 03/20/2010  . ESOPHAGEAL MOTILITY DISORDER 03/20/2010  . Hyperlipidemia 04/22/2009  . Coronary atherosclerosis 04/22/2009  . ATRIAL FIBRILLATION, PAROXYSMAL 04/22/2009  . ESOPHAGEAL STRICTURE 10/31/2004  . GERD 10/31/2004  . HIATAL HERNIA 10/06/2001   Outpatient Encounter Medications as of 04/07/2018  Medication Sig  . acetaminophen (TYLENOL) 650 MG CR tablet Take 650-1,300 mg by mouth every 8 (eight) hours as needed for pain. Depends on pain level if takes 1-2 tablets  . ALPRAZolam (XANAX) 0.5 MG tablet TAKE 2 TABLETS AT BEDTIME  . Ascorbic Acid (VITAMIN C) 1000 MG tablet Take 1,000 mg by mouth daily.  . beta carotene 25000 UNIT capsule Take 25,000 Units by mouth daily.  . budesonide (PULMICORT) 0.5 MG/2ML nebulizer solution Take 2 mLs (0.5 mg total) by nebulization 2 (two) times daily. Dx 496 (Patient taking differently: Take 0.5 mg by nebulization 2 (two) times daily  as needed (shortness of breath). Dx 496)  . Calcium Carbonate-Vitamin D (CALCIUM 600+D) 600-400 MG-UNIT per tablet Take 1 tablet by mouth daily at 6 PM.   . cholecalciferol (VITAMIN D) 1000 UNITS tablet Take 1,000 Units by mouth 2 (two) times daily with breakfast and lunch.   . Coenzyme Q10 (COQ10) 100 MG CAPS Take 100 mg by mouth daily.  Marland Kitchen denosumab (PROLIA) 60 MG/ML SOLN injection Inject 60 mg every 6 (six) months into the skin. Administer in upper arm, thigh, or  abdomen  . dicyclomine (BENTYL) 10 MG capsule TAKE (1) OR (2) CAPSULES EVERY SIX HOURS AS NEEDED FOR SPASM  . diltiazem (CARDIZEM CD) 120 MG 24 hr capsule TAKE (1) CAPSULE DAILY  . fluticasone (FLONASE) 50 MCG/ACT nasal spray 2 SPRAYS IN EACH NOSTRIL ONCE A DAY (Patient taking differently: 2 SPRAYS IN EACH NOSTRIL ONCE A DAY AS NEEDED FOR ALLERGIES)  . HYDROcodone-acetaminophen (NORCO) 5-325 MG tablet Take 1 tablet by mouth every 4 (four) hours as needed.  Marland Kitchen levothyroxine (SYNTHROID, LEVOTHROID) 25 MCG tablet TAKE 2 TABLETS DAILY AS DIRECTED  . Liniments (ANALGESIC EX) Apply 1 application topically 3 (three) times daily as needed (pain).  Marland Kitchen loratadine (CLARITIN) 10 MG tablet Take 1 tablet (10 mg total) by mouth daily.  . meclizine (ANTIVERT) 25 MG tablet TAKE (1) TABLET THREE TIMES DAILY AS NEEDED FOR DIZZINESS.  . Melatonin 5 MG TABS Take 10 mg by mouth every evening.  . metoprolol tartrate (LOPRESSOR) 25 MG tablet Take 2 tablets each morning and 1 tablet each evening  . montelukast (SINGULAIR) 10 MG tablet Take 1 tablet (10 mg total) by mouth daily.  Marland Kitchen omega-3 acid ethyl esters (LOVAZA) 1 g capsule TAKE (2) CAPSULES TWICE DAILY.  Marland Kitchen omeprazole-sodium bicarbonate (ZEGERID) 40-1100 MG capsule Take 1 capsule by mouth daily.  Marland Kitchen PROAIR HFA 108 (90 Base) MCG/ACT inhaler USE 2 PUFFS EVERY 6 HOURS AS NEEDED FOR WHEEZING  . rosuvastatin (CRESTOR) 20 MG tablet Take 1 tablet (20 mg total) by mouth daily.  . sertraline (ZOLOFT) 100 MG tablet Take 1 tablet (100 mg total) by mouth daily.  . tamsulosin (FLOMAX) 0.4 MG CAPS capsule Take 1 capsule (0.4 mg total) by mouth daily after breakfast.  . XARELTO 20 MG TABS tablet Take 1 tablet (20 mg total) by mouth daily.  . nitroGLYCERIN (NITROSTAT) 0.4 MG SL tablet 1 tablet under the tongue every 5 minutes as needed for chest pain. (Patient not taking: Reported on 02/19/2018)  . [DISCONTINUED] AMBULATORY NON FORMULARY MEDICATION Place 0.125 mg rectally 3 (three) times  daily. Medication Name: Nitroglycerin ointment (Patient not taking: Reported on 02/19/2018)  . [DISCONTINUED] benzonatate (TESSALON) 100 MG capsule TAKE 1 CAPSULE TWICE DAILY AS NEEDED FOR COUGH (Patient not taking: Reported on 02/19/2018)  . [DISCONTINUED] ondansetron (ZOFRAN) 4 MG tablet TAKE 1 TABLET EVERY 12 HOURS AS NEEDED FOR NAUSEA (Patient not taking: Reported on 02/19/2018)   No facility-administered encounter medications on file as of 04/07/2018.      Review of Systems  Constitutional: Negative.   HENT: Negative.   Eyes: Negative.   Respiratory: Negative.   Cardiovascular: Negative.   Gastrointestinal: Negative.   Endocrine: Negative.   Genitourinary: Negative.   Musculoskeletal: Negative.   Skin: Negative.        Several skin lesions - sides, abd and face   Allergic/Immunologic: Negative.   Neurological: Negative.   Hematological: Negative.   Psychiatric/Behavioral: Negative.        Objective:   Physical Exam  Constitutional:  She is oriented to person, place, and time. She appears well-developed and well-nourished. No distress.  The patient is pleasant and alert and in good spirits despite being confined to a wheelchair because of her chronic foot deformity and pain.  HENT:  Head: Normocephalic and atraumatic.  Right Ear: External ear normal.  Left Ear: External ear normal.  Nose: Nose normal.  Mouth/Throat: Oropharynx is clear and moist. No oropharyngeal exudate.  Eyes: Pupils are equal, round, and reactive to light. Conjunctivae and EOM are normal. Right eye exhibits no discharge. Left eye exhibits no discharge. No scleral icterus.  Neck: Normal range of motion. Neck supple. No thyromegaly present.  No bruits thyromegaly or anterior cervical adenopathy  Cardiovascular: Normal rate, regular rhythm, normal heart sounds and intact distal pulses.  No murmur heard. Today, the heart had a regular rate and rhythm at 60/min with good pedal pulses  Pulmonary/Chest: Effort  normal and breath sounds normal. No respiratory distress. She has no wheezes. She has no rales.  Clear anteriorly and posteriorly  Abdominal: Soft. Bowel sounds are normal. She exhibits no mass. There is no tenderness. There is no rebound and no guarding.  Patient was examined in the wheelchair and no tenderness masses or organ enlargement was detected.  Musculoskeletal: She exhibits tenderness and deformity. She exhibits no edema.  The patient is unable to walk and comes in in a wheelchair.  The right ankle is severely deformed with a callus formation laterally.  There is any eversion of the ankle and it continues to get worse.  She has a bunion deformity on the left foot.  The right foot is especially tender to palpation.  There is no redness or induration.  Lymphadenopathy:    She has no cervical adenopathy.  Neurological: She is alert and oriented to person, place, and time.  Skin: Skin is warm and dry. No rash noted.  Several skin lesions on the right lateral side in the chest area appear benign and we will continue to monitor these.  If they become irritating to her we can always do cryotherapy if necessary.  Psychiatric: She has a normal mood and affect. Her behavior is normal. Judgment and thought content normal.  Nursing note and vitals reviewed.  BP 132/67 (BP Location: Left Arm)   Pulse (!) 59   Temp (!) 97.3 F (36.3 C) (Oral)   Ht '5\' 1"'$  (1.549 m)   Wt 150 lb (68 kg)   BMI 28.34 kg/m         Assessment & Plan:  1. Essential hypertension -The blood pressure is good today and she will continue with her current treatment regimen - BMP8+EGFR - CBC with Differential/Platelet - Hepatic function panel  2. Pure hypercholesterolemia -Continue with current treatment pending results of lab work - CBC with Differential/Platelet - Lipid panel  3. Gait instability -Continue to follow-up with orthopedic foot specialist - CBC with Differential/Platelet  4. Vitamin D  deficiency -Continue current treatment pending results of lab work - CBC with Differential/Platelet - VITAMIN D 25 Hydroxy (Vit-D Deficiency, Fractures)  5. Paroxysmal atrial fibrillation (HCC) -Continue to follow-up with cardiology - CBC with Differential/Platelet  6. Gastroesophageal reflux disease, esophagitis presence not specified -No complaints today with reflux but she will continue with her current treatment regimen - CBC with Differential/Platelet  7. Fibromyalgia -Prescription for Xanax was given today. - CBC with Differential/Platelet - Thyroid Panel With TSH - Vitamin B12  8. Chronic pain syndrome -The patient continues to have severe pain in  her right foot as well is sharp bouts of pain in her back. - CBC with Differential/Platelet - Thyroid Panel With TSH - Vitamin B12  9. Fatigue, unspecified type -This fatigue is especially bad in the morning when she arises - Thyroid Panel With TSH - Vitamin B12  10. Chronic diastolic heart failure ([place]) -Follow-up with cardiology as planned  11. Chronic fatigue -She is trying to get the results of the sleep study that was done in early April and this may shed some light on her early morning fatigue.  Meds ordered this encounter  Medications  . ALPRAZolam (XANAX) 0.5 MG tablet    Sig: Take 2 tablets (1 mg total) by mouth at bedtime.    Dispense:  60 tablet    Refill:  5   Patient Instructions                       Medicare Annual Wellness Visit  [facility] and the medical providers at [facility] strive to bring you the best medical care.  In doing so we not only want to address your current medical conditions and concerns but also to detect new conditions early and prevent illness, disease and health-related problems.    Medicare offers a yearly Wellness Visit which allows our clinical staff to assess your need for preventative services including immunizations, lifestyle education, counseling  to decrease risk of preventable diseases and screening for fall risk and other medical concerns.    This visit is provided free of charge (no copay) for all Medicare recipients. The clinical pharmacists at [facility] have begun to conduct these Wellness Visits which will also include a thorough review of all your medications.    As you primary medical provider recommend that you make an appointment for your Annual Wellness Visit if you have not done so already this year.  You may set up this appointment before you leave today or you may call back ([phone]) and schedule an appointment.  Please make sure when you call that you mention that you are scheduling your Annual Wellness Visit with the clinical pharmacist so that the appointment may be made for the proper length of time.     Continue current medications. Continue good therapeutic lifestyle changes which include good diet and exercise. Fall precautions discussed with patient. If an FOBT was given today- please return it to our front desk. If you are over 57 years old - you may need Prevnar 91 or the adult Pneumonia vaccine.  **Flu shots are available--- please call and schedule a FLU-CLINIC appointment**  After your visit with [place] today you will receive a survey in the mail or online from [employer] regarding your care with [place]. Please take a moment to fill this out. Your feedback is very important to [place] as you can help [place] better understand your patient needs as well as improve your experience and satisfaction. WE CARE ABOUT YOU!!!   Follow-up with orthopedist as planned The skin lesions that you pointed out to me under the right arm and to the side of the right breast all appear benign and more like seborrheic keratoses along with one skin tag and we will continue to monitor those.  If any or one of them become irritated we can always do cryotherapy. Continue to follow-up with cardiology as planned Check with  cardiology regarding the results of your sleep study which the report has been delayed for some reason.  Arrie Senate MD

## 2018-04-08 LAB — LIPID PANEL
Chol/HDL Ratio: 2.2 ratio (ref 0.0–4.4)
Cholesterol, Total: 117 mg/dL (ref 100–199)
HDL: 53 mg/dL (ref >39)
LDL Calculated: 41 mg/dL (ref 0–99)
Triglycerides: 115 mg/dL (ref 0–149)
VLDL Cholesterol Cal: 23 mg/dL (ref 5–40)

## 2018-04-08 LAB — CBC WITH DIFFERENTIAL/PLATELET
Basophils Absolute: 0 10*3/uL (ref 0.0–0.2)
Basos: 0 %
EOS (ABSOLUTE): 0.2 10*3/uL (ref 0.0–0.4)
Eos: 2 %
Hematocrit: 38.9 % (ref 34.0–46.6)
Hemoglobin: 12.9 g/dL (ref 11.1–15.9)
Immature Grans (Abs): 0 10*3/uL (ref 0.0–0.1)
Immature Granulocytes: 0 %
Lymphocytes Absolute: 3.5 10*3/uL — ABNORMAL HIGH (ref 0.7–3.1)
Lymphs: 44 %
MCH: 30.6 pg (ref 26.6–33.0)
MCHC: 33.2 g/dL (ref 31.5–35.7)
MCV: 92 fL (ref 79–97)
Monocytes Absolute: 0.6 10*3/uL (ref 0.1–0.9)
Monocytes: 8 %
Neutrophils Absolute: 3.6 10*3/uL (ref 1.4–7.0)
Neutrophils: 46 %
Platelets: 276 10*3/uL (ref 150–379)
RBC: 4.21 x10E6/uL (ref 3.77–5.28)
RDW: 13.1 % (ref 12.3–15.4)
WBC: 7.9 10*3/uL (ref 3.4–10.8)

## 2018-04-08 LAB — THYROID PANEL WITH TSH
Free Thyroxine Index: 1.4 (ref 1.2–4.9)
T3 Uptake Ratio: 24 % (ref 24–39)
T4, Total: 5.7 ug/dL (ref 4.5–12.0)
TSH: 2.74 u[IU]/mL (ref 0.450–4.500)

## 2018-04-08 LAB — BMP8+EGFR
BUN/Creatinine Ratio: 18 (ref 12–28)
BUN: 14 mg/dL (ref 8–27)
CO2: 25 mmol/L (ref 20–29)
Calcium: 9.6 mg/dL (ref 8.7–10.3)
Chloride: 99 mmol/L (ref 96–106)
Creatinine, Ser: 0.8 mg/dL (ref 0.57–1.00)
GFR calc Af Amer: 81 mL/min/{1.73_m2} (ref >59)
GFR calc non Af Amer: 70 mL/min/{1.73_m2} (ref >59)
Glucose: 87 mg/dL (ref 65–99)
Potassium: 5 mmol/L (ref 3.5–5.2)
Sodium: 137 mmol/L (ref 134–144)

## 2018-04-08 LAB — HEPATIC FUNCTION PANEL
ALT: 21 IU/L (ref 0–32)
AST: 27 IU/L (ref 0–40)
Albumin: 4.6 g/dL (ref 3.5–4.7)
Alkaline Phosphatase: 93 IU/L (ref 39–117)
Bilirubin Total: 0.4 mg/dL (ref 0.0–1.2)
Bilirubin, Direct: 0.13 mg/dL (ref 0.00–0.40)
Total Protein: 7.8 g/dL (ref 6.0–8.5)

## 2018-04-08 LAB — VITAMIN B12: Vitamin B-12: 1199 pg/mL (ref 232–1245)

## 2018-04-08 LAB — VITAMIN D 25 HYDROXY (VIT D DEFICIENCY, FRACTURES): Vit D, 25-Hydroxy: 52.2 ng/mL (ref 30.0–100.0)

## 2018-04-11 ENCOUNTER — Encounter (HOSPITAL_BASED_OUTPATIENT_CLINIC_OR_DEPARTMENT_OTHER): Payer: Self-pay | Admitting: Cardiovascular Disease

## 2018-04-11 NOTE — Procedures (Signed)
Patient Name: [name], Stay Date: 03/17/2018 Gender: Female D.O.B: [birth year] Age (years): 81 Referring Provider: Minus Breeding Height (inches): 61 Interpreting Physician: Shelva Majestic MD, ABSM Weight (lbs): 150 RPSGT: Jorge Ny BMI: 28 MRN: [id number] Neck Size: 14.00  CLINICAL INFORMATION Sleep Study Type: NPSG  Indication for sleep study: Fatigue, Snoring  Epworth Sleepiness Score: 2  SLEEP STUDY TECHNIQUE As per the AASM Manual for the Scoring of Sleep and Associated Events v2.3 (April 2016) with a hypopnea requiring 4% desaturations.  The channels recorded and monitored were frontal, central and occipital EEG, electrooculogram (EOG), submentalis EMG (chin), nasal and oral airflow, thoracic and abdominal wall motion, anterior tibialis EMG, snore microphone, electrocardiogram, and pulse oximetry.  MEDICATIONS     acetaminophen (TYLENOL) 650 MG CR tablet             ALPRAZolam (XANAX) 0.5 MG tablet         Ascorbic Acid (VITAMIN C) 1000 MG tablet         beta carotene 25000 UNIT capsule         budesonide (PULMICORT) 0.5 MG/2ML nebulizer solution         Calcium Carbonate-Vitamin D (CALCIUM 600+D) 600-400 MG-UNIT per tablet         cholecalciferol (VITAMIN D) 1000 UNITS tablet         Coenzyme Q10 (COQ10) 100 MG CAPS         denosumab (PROLIA) 60 MG/ML SOLN injection         dicyclomine (BENTYL) 10 MG capsule         diltiazem (CARDIZEM CD) 120 MG 24 hr capsule         fluticasone (FLONASE) 50 MCG/ACT nasal spray         HYDROcodone-acetaminophen (NORCO) 5-325 MG tablet         levothyroxine (SYNTHROID, LEVOTHROID) 25 MCG tablet         Liniments (ANALGESIC EX)         loratadine (CLARITIN) 10 MG tablet         meclizine (ANTIVERT) 25 MG tablet         Melatonin 5 MG TABS         metoprolol tartrate (LOPRESSOR) 25 MG tablet         montelukast (SINGULAIR) 10 MG tablet         nitroGLYCERIN (NITROSTAT) 0.4 MG SL tablet         omega-3 acid ethyl  esters (LOVAZA) 1 g capsule         omeprazole-sodium bicarbonate (ZEGERID) 40-1100 MG capsule         PROAIR HFA 108 (90 Base) MCG/ACT inhaler         rosuvastatin (CRESTOR) 20 MG tablet         sertraline (ZOLOFT) 100 MG tablet         tamsulosin (FLOMAX) 0.4 MG CAPS capsule         XARELTO 20 MG TABS tablet      Medications self-administered by patient taken the night of the study : XANAX, FLONASE, MELATONIN, SINGULAIR, CRESTOR, [place] The study was initiated at 10:54:21 PM and ended at 5:30:19 AM.  Sleep onset time was 54.8 minutes and the sleep efficiency was 59.8%%. The total sleep time was 236.7 minutes.  Stage REM latency was N/A minutes.  The patient spent 8.7%% of the night in stage N1 sleep, 91.3%% in stage N2 sleep, 0.0%% in stage N3 and 0.00% in REM.  Alpha intrusion was absent.  Supine sleep was 100.00%.  RESPIRATORY PARAMETERS The overall apnea/hypopnea index (AHI) was 1.0 per hour.  The respiratory disturbance index (RDI) was 1.3 per hour. There were 0 total apneas, including 0 obstructive, 0 central and 0 mixed apneas. There were 4 hypopneas and 1 RERAs.  The AHI during Stage REM sleep was N/A per hour.  AHI while supine was 1.0 per hour.  The mean oxygen saturation was 94.0%. The minimum SpO2 during sleep was 90.0%.  Moderate snoring was noted during this study.  CARDIAC DATA The 2 lead EKG demonstrated sinus rhythm. The mean heart rate was 55.4 beats per minute. Other EKG findings include: None.  LEG MOVEMENT DATA  Associated arousal with leg movement index was 16.7 .  IMPRESSIONS - No significant obstructive sleep apnea occurred during this study (AHI 1.0/h; RDI 1.3/h)); however, REM sleep did not occur. - No significant central sleep apnea occurred during this study (CAI 0.0/h). - No oxygen desaturation during the study; oxygen nadir 90%. - Reduced sleep efficiency. - Abnormal sleep architecture with absence of Stage 3 and REM  sleep. - The patient snored with moderate snoring volume. - No cardiac abnormalities were noted during this study. - Clinically significant periodic limb movements did not occur during sleep. Associated arousals were significant.  DIAGNOSIS - Periodic Limb Movement During Sleep (327.51 [G47.61 ICD-10]) - Nocturnal Hypoxemia (327.26 [G47.36 ICD-10])  RECOMMENDATIONS - At present there is no indication for CPAP. - Efforts should be made to optimize nasal and oropharyngeal patency. - If patient is symptomatic with restless legs consider a trial of pharmacotherapy. - Evaluate for medicine contribution for REM suppression - Avoid alcohol, sedatives and other CNS depressants that may worsen sleep apnea and disrupt normal sleep architecture. - Sleep hygiene should be reviewed to assess factors that may improve sleep quality and efficiency. - Weight management and regular exercise should be initiated or continued if appropriate.  [Electronically signed] 04/11/2018 09:54 AM  Shelva Majestic MD, [facility] [place], American Board of Sleep Medicine   NPI: [id number] [place] PH: [phone]   FX: [phone] [place]

## 2018-04-12 NOTE — Progress Notes (Signed)
Please make sure that the patient has the results and knows that there is no significant sleep apnea.

## 2018-04-13 ENCOUNTER — Telehealth: Payer: Self-pay | Admitting: *Deleted

## 2018-04-13 NOTE — Telephone Encounter (Signed)
Patient notified of sleep study results. She states that she is so tired when she wakes up in the mornings she can "hardly stand it." she states that she is not sure where this is coming from because she has so many health problems. She wants me to ask Dr Claiborne Billings if she can try medication for her restless leg to see if this will help her. I told her that I will send him a message and call her back tomorrow with his recommendations. I also offered patient appointment to discuss this with Dr Claiborne Billings, and she states that she would rather try the medication first then come see him afterwards. Patient states that it is really hard for her to get around. Message routed to Dr Claiborne Billings for further recommendations.

## 2018-04-13 NOTE — Progress Notes (Signed)
Patient notified of results.

## 2018-04-13 NOTE — Telephone Encounter (Signed)
-----   Message from Minus Breeding, MD sent at 04/12/2018  9:10 AM EDT -----   ----- Message ----- From: Minus Breeding, MD Sent: 04/11/2018   9:57 AM To: Minus Breeding, MD, Chipper Herb, MD

## 2018-04-13 NOTE — Telephone Encounter (Signed)
Barry Brunner, CMA spoke with pt about her sleep study

## 2018-04-13 NOTE — Telephone Encounter (Signed)
-----   Message from Troy Sine, MD sent at 04/11/2018  9:58 AM EDT ----- Mariann Laster, please notify pt results of study

## 2018-04-14 ENCOUNTER — Other Ambulatory Visit: Payer: Self-pay | Admitting: Cardiology

## 2018-04-14 ENCOUNTER — Other Ambulatory Visit: Payer: Self-pay | Admitting: Family Medicine

## 2018-04-14 NOTE — Telephone Encounter (Signed)
I have never seen the patient in cannot prescribe treatment.  She is seen by Dr. Percival Spanish for cardiology and Dr. Laurance Flatten.  Can consider an office visit with me to further evaluate or have Dr. Laurance Flatten see for primary care who can prescribe.

## 2018-04-14 NOTE — Telephone Encounter (Signed)
Rx(s) sent to pharmacy electronically.  

## 2018-04-17 NOTE — Telephone Encounter (Signed)
Patient notified per Dr Claiborne Billings she will need office appointment to see either him or Dr Laurance Flatten before she can be prescribed medication for ? RLS. Patient states she will make a appointment to see Dr Laurance Flatten because he is closer to her house. I informed patient to be sure to tell Dr Laurance Flatten that she had a sleep study so that he can review it to help with his diagnosis and treatment plan.

## 2018-04-20 ENCOUNTER — Telehealth: Payer: Self-pay | Admitting: Family Medicine

## 2018-04-20 NOTE — Telephone Encounter (Signed)
appt scheduled Pt notified 

## 2018-04-20 NOTE — Telephone Encounter (Signed)
Prolia not due until 04/30/2018

## 2018-04-22 ENCOUNTER — Ambulatory Visit: Payer: Medicare Other | Admitting: Internal Medicine

## 2018-04-27 ENCOUNTER — Encounter (HOSPITAL_COMMUNITY): Payer: Self-pay | Admitting: Specialist

## 2018-04-27 ENCOUNTER — Other Ambulatory Visit: Payer: Self-pay

## 2018-04-27 ENCOUNTER — Ambulatory Visit (HOSPITAL_COMMUNITY): Payer: Medicare Other | Attending: Family Medicine | Admitting: Specialist

## 2018-04-27 DIAGNOSIS — M25612 Stiffness of left shoulder, not elsewhere classified: Secondary | ICD-10-CM | POA: Diagnosis not present

## 2018-04-27 DIAGNOSIS — M25611 Stiffness of right shoulder, not elsewhere classified: Secondary | ICD-10-CM | POA: Diagnosis not present

## 2018-04-27 DIAGNOSIS — R262 Difficulty in walking, not elsewhere classified: Secondary | ICD-10-CM | POA: Diagnosis not present

## 2018-04-27 NOTE — Therapy (Signed)
[facility] [address] [place], [place], [address] Phone: [phone]   Fax:  [phone]  Occupational Therapy Evaluation  Patient Details  Name: [name] MRN: [id number] Date of Birth: November 03, 1936 Referring Provider: Dr. Redge Gainer   Encounter Date: 04/27/2018  OT End of Session - 04/27/18 1501    Visit Number  1    Number of Visits  1    Authorization Type  [employer] Medicare     OT Start Time  3474    OT Stop Time  1455    OT Time Calculation (min)  60 min    Activity Tolerance  Patient tolerated treatment well    Behavior During Therapy  [facility] for tasks assessed/performed       Past Medical History:  Diagnosis Date  . Anxiety   . Arthritis   . Asthmatic bronchitis   . Atrial fibrillation ([place])   . Bowel obstruction ([facility])    blockage  . CAD (coronary artery disease)    Stent to RI 2003.  Myoview 2013 no ischemia.  . Cataract   . Chronic back pain   . Chronic bronchitis ([place])   . Colon polyp    adenomatous  . Congestive heart disease (H[facility])    Preserved EF  . Depression   . Esophageal motility disorder   . Fibromyalgia   . GERD (gastroesophageal reflux disease)   . History of hiatal hernia   . History of kidney stones   . Hyperlipidemia   . Hypertension   . Hypothyroid   . 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 Myocardial infarction ([place])    2000  . Nephrolithiasis   . OA (osteoarthritis)   . Status post dilation of esophageal narrowing     Past Surgical History:  Procedure Laterality Date  . ABDOMINAL HYSTERECTOMY     partial  . ANKLE FUSION  08/27/2012   Procedure: ARTHRODESIS ANKLE;  Surgeon: Wylene Simmer, MD;  Location: [place];  Service: Orthopedics;  Laterality: Right;  Arthrodesis right ankle and subtalar joint  . APPENDECTOMY    . ARTHRODESIS TIBIOFIBULAR Right 03/31/2014   Procedure: REVISION OF SUBTALOR ARTHRODESIS  RIGHT ;  Surgeon: Wylene Simmer, MD;  Location: [place];   Service: Orthopedics;  Laterality: Right;  . BACK SURGERY    . BREAST SURGERY     Left breast lump removed  . BRONCHOSCOPY    . CORONARY ANGIOPLASTY WITH STENT PLACEMENT  2003  . DECORTICATION Right 05/03/2014   Procedure: DECORTICATION;  Surgeon: [name], MD;  Location: [place];  Service: Thoracic;  Laterality: Right;  . EYE SURGERY  2016   cateracts  . FOOT ARTHRODESIS, SUBTALAR Right 2013  . HARDWARE REMOVAL Right 03/31/2014   Procedure: REMOVAL OF DEEP IMPLANTS X 3  RIGHT ;  Surgeon: Wylene Simmer, MD;  Location: [place];  Service: Orthopedics;  Laterality: Right;  . HARDWARE REMOVAL Right 11/03/2014   Procedure: HARDWARE REMOVAL OF SUBTALAR JOINT;  Surgeon: Wylene Simmer, MD;  Location: [place];  Service: Orthopedics;  Laterality: Right;  . HARDWARE REVISION  03/31/2014   SUBTALOR  ARTHRODESIS       DR HEWITT  . JOINT REPLACEMENT     Right knee  . KNEE ARTHROSCOPY  03/26/2012   Procedure: ARTHROSCOPY KNEE;  Surgeon: Wylene Simmer, MD;  Location: [place];  Service: Orthopedics;  Laterality: Left;  with Debridement of Lateral Meniscus tear  . REMOVAL OF  IMPLANT Right 03/31/2014   DR HEWITT  . ROTATOR CUFF REPAIR Bilateral   . SINUS SURGERY WITH INSTATRAK    . SPINAL CORD STIMULATOR INSERTION N/A 07/02/2017   Procedure: LUMBAR SPINAL CORD STIMULATOR INSERTION;  Surgeon: Melina Schools, MD;  Location: [place];  Service: Orthopedics;  Laterality: N/A;  120 mins  . TOTAL KNEE ARTHROPLASTY Right   . VIDEO ASSISTED THORACOSCOPY (VATS)/EMPYEMA Right 05/03/2014   Procedure: VIDEO ASSISTED THORACOSCOPY (VATS)/EMPYEMA;  Surgeon: [name], MD;  Location: [place];  Service: Thoracic;  Laterality: Right;  [place] [facility] VIDEO BRONCHOSCOPY N/A 05/03/2014   Procedure: VIDEO BRONCHOSCOPY;  Surgeon: [name], MD;  Location: [facility];  Service: Thoracic;  Laterality: N/A;    There were no vitals filed for this visit.  Subjective Assessment - 04/27/18 1459    Currently in Pain?  Yes     Pain Score  10-Worst pain ever    Pain Location  Foot    Pain Orientation  Right    Pain Descriptors / Indicators  Sharp    Pain Type  Chronic pain    Pain Radiating Towards  ankle    Pain Onset  More than a month ago    Pain Frequency  Intermittent    Aggravating Factors   pain increases from constant 5/10 to 10/10 when patient ambulates or bears weight through her foot         [facility] OT Assessment - 04/27/18 0001      Assessment   Medical Diagnosis  Difficulty Walking     Referring Provider  Dr. Redge Gainer    Onset Date/Surgical Date  -- chronic    Hand Dominance  Right      Precautions   Precautions  Fall      Restrictions   Weight Bearing Restrictions  No      Balance Screen   Has the patient fallen in the past 6 months  Yes    How many times?  5    Has the patient had a decrease in activity level because of a fear of falling?   Yes    Is the patient reluctant to leave their home because of a fear of falling?   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      Written Expression   Dominant Hand  Right      Vision - History   Baseline Vision  Wears glasses all the time      Activity Tolerance   Activity Tolerance  Tolerates < min activity, no significant change in vital signs      Cognition   Overall Cognitive Status  Within Functional Limits for tasks assessed      Sensation   Light Touch  Appears Intact      Coordination   Gross Motor Movements are Fluid and Coordinated  Yes    Fine Motor Movements are Fluid and Coordinated  Yes              04/27/2018  [name] is an 82 year old female referred to occupational therapy for evaluation for a power  wheelchair.  [name] has a  medical history that includes anxiety, asthma, CHF, chronic bronchitis, HTN, arthritis, cyst on spinal cord, bulging discs, 3 foot surgeries, bilateral rotator cuff repair, right TKA, Due to her 3 foot surgeries, she has a bony prominence protruding on the base of her right foot, at the mid tarsal.  When she ambulates, she has 10/10 pain due to this protruding bone.  She reports constant 5/10 pain in her back, shoulders, knees, hips, and feet.  When she ambulates, her pain level in her right foot increases to 10/10.  [name] experiences frequent falls and has had at least 5 falls in the past 6 months.  [name] lives in a ground level one bedroom apartment.  [name] is able to ambulate with a straight cane or her rollator in her home.  However, ambulating is painful for her and she gets short of breath after ambulating household distances due to CHF.  She is able to dress herself, bathe herself, and complete toileting and grooming activities with increased time to complete.  She has a comfort level commode and a tub transfer bench with grab bars.  She is unable to complete IADLs such as cooking and cleaning due to her decreased sustained activity tolerance and increased pain level.  [name] states that a power wheelchair would assist her with mobility in her home.  Currently, she is confined to her chair in the living room for most of the day due to her inability to ambulate long distances.  Her goal for obtaining a power wheelchair is to increase her safety and independence in her home.   A FULL PHYSICAL ASSESSMENT REVEALS THE FOLLOWING  Existing Equipment: standard wheelchair, cane, rollator, tub transfer bench Transfers: [name] transferred independently from sit to stand and stand to sit this date, utilizing a chair with arms. Ambulation:  [name] was able to ambulate 60 feet on 2 occasions this date, using her cane and stand by assist for safety.  Her  ambulation is painful due to her right foot, and causes her to be short of breath.  Balance:  WFL static sitting balance.   Head and Neck: WNL  Trunk: WFL Pelvis: WNL  Hip: bilateral hip A/ROM is [facility].   Knees: bilateral knee A/ROM is [facility]. Feet and Ankles:  right ankle is fused, left ankle A/ROM is [facility]. Upper Extremities: Bilateral shoulder A/ROM is limited to 50%.  Elbow, wrist, and hand A/ROM is [facility].   Strength is 4-/5 in left upper extremity and 3+/5 in right upper extremity.  Lower Extremities: BLE strength is 3+/5.   Weight Shifting Ability: Independent. Skin Integrity: WFL.  Activity Tolerance:  Fair.  Patient requires frequent rest breaks due to shortness of breath.   Seating Recommendations:  Patient will benefit from a power wheelchair.    GOALS/OBJECTIVE OF SEATING INTERVENTION  Recommendations:  [name]  has functional deficits in the areas of mobility and self-care, which are a result of the above listed diagnosis.  Specific functional limitations include: The patient is unable to walk long distances due to fatigue and body pain.  She would not be able to functionally propel a standard manual wheelchair for household distances.  [name] will use a power wheelchair on a daily basis in her home for increased safety and decreased pain when completing in home mobility and daily tasks.  The recommended wheelchair meets current and future positioning needs, accessibility, durability, and safety requirements for functional use within patient's  home environment. It is the least expensive power wheelchair that meets the patients current and future needs. Recommended chair:  Vision Sport MWD 18x18 with captain seat.  Patient will need a swing away joystick mount on the right so that she can utilize her power wheelchair during meal times, grooming activities, and meal prep.  She is unable to stand with an assistive device to complete these activities due to leg and foot pain, and unable to  use a standard wheelchair due to shoulder pain, and compromised breathing. Patient will need a cane holder so that she can transport her cane when mobilizing throughout the home she will have her cane with her when the opportunity to transfer to the toilet or lift chair occurs.   If you require any further information concerning [name]  positioning, independence or mobility needs; or any further information why a lesser device will not work, please do not hesitate to contact me at [place] [place], [place] [address] [phone].   ___________________ __________  [name], OTR/L     Date                    OT Education - 04/27/18 1500    Education provided  Yes    Education Details  timeline for aquiring power wheelchair, educated to gather items and meal prep seated at kitchen table vs standing at counter    Person(s) Educated  Patient    Methods  Explanation    Comprehension  Verbalized understanding       OT Short Term Goals - 04/27/18 1508      OT SHORT TERM GOAL #1   Title  patient will complete power wheelchair assessment.    Time  1    Period  Days    Status  Achieved               Plan - 04/27/18 1508    OT Frequency  One time visit    OT Treatment/Interventions  Self-care/ADL training    Clinical Decision Making  Limited treatment options, no task modification necessary    Consulted and Agree with Plan of Care  Patient       Patient will benefit from skilled therapeutic intervention in order to improve the following deficits and impairments:     Visit Diagnosis: Difficulty in walking, not elsewhere classified  Stiffness of left shoulder, not elsewhere classified  Stiffness of right shoulder, not elsewhere classified    Problem List Patient Active Problem List   Diagnosis Date Noted  . Snoring 02/19/2018  . Other fatigue 02/19/2018  . Chronic pain 07/02/2017   . Vocal cord dysfunction 02/04/2017  . Irritable bowel syndrome with diarrhea 12/11/2015  . UTI (urinary tract infection) 08/22/2015  . Cough 07/26/2015  . Diarrhea 11/30/2014  . Heme + stool 11/30/2014  . Chronic anticoagulation 11/30/2014  . CHF (congestive heart failure) ([place]) 04/30/2014  . 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 Osteoporosis 01/27/2014  . Fibromyalgia 08/09/2013  . Asthma 08/01/2010  . Hypothyroidism 03/20/2010  . Depression 03/20/2010  . HTN (hypertension) 03/20/2010  . ESOPHAGEAL MOTILITY DISORDER 03/20/2010  . Hyperlipidemia 04/22/2009  . Coronary atherosclerosis 04/22/2009  . ATRIAL FIBRILLATION, PAROXYSMAL 04/22/2009  . ESOPHAGEAL STRICTURE 10/31/2004  . GERD 10/31/2004  . HIATAL HERNIA 10/06/2001    [name], [place], OTR/L [phone]  04/27/2018, 3:10 PM  [place]  [place], [place], [address] Phone: [phone]   Fax:  [phone]  Name: [name] MRN: [id number] Date of Birth: 1936-05-06

## 2018-04-30 ENCOUNTER — Ambulatory Visit: Payer: Medicare Other

## 2018-05-05 ENCOUNTER — Emergency Department (HOSPITAL_COMMUNITY)
Admission: EM | Admit: 2018-05-05 | Discharge: 2018-05-05 | Disposition: A | Discharge status: Home / Self Care | Payer: Medicare Other | Attending: Emergency Medicine | Admitting: Emergency Medicine

## 2018-05-05 ENCOUNTER — Emergency Department (HOSPITAL_COMMUNITY): Payer: Medicare Other

## 2018-05-05 ENCOUNTER — Other Ambulatory Visit: Payer: Self-pay

## 2018-05-05 ENCOUNTER — Encounter (HOSPITAL_COMMUNITY): Payer: Self-pay | Admitting: Emergency Medicine

## 2018-05-05 DIAGNOSIS — Z7901 Long term (current) use of anticoagulants: Secondary | ICD-10-CM | POA: Insufficient documentation

## 2018-05-05 DIAGNOSIS — Z87891 Personal history of nicotine dependence: Secondary | ICD-10-CM | POA: Insufficient documentation

## 2018-05-05 DIAGNOSIS — W228XXA Striking against or struck by other objects, initial encounter: Secondary | ICD-10-CM | POA: Diagnosis not present

## 2018-05-05 DIAGNOSIS — E039 Hypothyroidism, unspecified: Secondary | ICD-10-CM | POA: Diagnosis not present

## 2018-05-05 DIAGNOSIS — I110 Hypertensive heart disease with heart failure: Secondary | ICD-10-CM | POA: Insufficient documentation

## 2018-05-05 DIAGNOSIS — S0990XA Unspecified injury of head, initial encounter: Secondary | ICD-10-CM | POA: Diagnosis not present

## 2018-05-05 DIAGNOSIS — I509 Heart failure, unspecified: Secondary | ICD-10-CM | POA: Insufficient documentation

## 2018-05-05 DIAGNOSIS — I2510 Atherosclerotic heart disease of native coronary artery without angina pectoris: Secondary | ICD-10-CM | POA: Insufficient documentation

## 2018-05-05 DIAGNOSIS — Y92009 Unspecified place in unspecified non-institutional (private) residence as the place of occurrence of the external cause: Secondary | ICD-10-CM | POA: Insufficient documentation

## 2018-05-05 DIAGNOSIS — Z79899 Other long term (current) drug therapy: Secondary | ICD-10-CM | POA: Insufficient documentation

## 2018-05-05 DIAGNOSIS — Z955 Presence of coronary angioplasty implant and graft: Secondary | ICD-10-CM | POA: Insufficient documentation

## 2018-05-05 DIAGNOSIS — Y999 Unspecified external cause status: Secondary | ICD-10-CM | POA: Diagnosis not present

## 2018-05-05 DIAGNOSIS — W19XXXA Unspecified fall, initial encounter: Secondary | ICD-10-CM

## 2018-05-05 DIAGNOSIS — Z96651 Presence of right artificial knee joint: Secondary | ICD-10-CM | POA: Insufficient documentation

## 2018-05-05 DIAGNOSIS — S0083XA Contusion of other part of head, initial encounter: Secondary | ICD-10-CM | POA: Diagnosis not present

## 2018-05-05 DIAGNOSIS — Y939 Activity, unspecified: Secondary | ICD-10-CM | POA: Insufficient documentation

## 2018-05-05 DIAGNOSIS — S098XXA Other specified injuries of head, initial encounter: Secondary | ICD-10-CM | POA: Diagnosis not present

## 2018-05-05 DIAGNOSIS — G44209 Tension-type headache, unspecified, not intractable: Secondary | ICD-10-CM | POA: Diagnosis not present

## 2018-05-05 NOTE — ED Triage Notes (Signed)
Pt was in her kitchen and states falling hitting her head on the mat of the floor.  Bruising to left temporal area. Denies LOC

## 2018-05-05 NOTE — ED Triage Notes (Signed)
Dr. Alvino Chapel aware of pt, ok to order head ct.

## 2018-05-05 NOTE — Discharge Instructions (Addendum)
Follow-up with your doctor if any problems.  Use a walker to ambulate with

## 2018-05-05 NOTE — ED Triage Notes (Signed)
Per EMS pt was making coffee and fell in her kitchen today around 1400.  EMS says unknown why she fell, denied being dizzy or passing out.   Reports hit her head and has bruise to left side of face under left eye.  No loss of consciousness per ems.  Pt alert and oriented, vss per ems.  Pt on xarelto.

## 2018-05-05 NOTE — ED Provider Notes (Signed)
[facility] EMERGENCY DEPARTMENT Provider Note   CSN: [id number] Arrival date & time: 05/05/18  1559     History   Chief Complaint Chief Complaint  Patient presents with  . Fall    HPI [name] is a 82 y.o. female.  Patient states that she fell at home and hit her head but had no loss of consciousness.  Patient states that she has a problem with coordination and falls easily  The history is provided by the patient. No language interpreter was used.  Fall  This is a new problem. The current episode started 6 to 12 hours ago. The problem occurs every several days. The problem has been resolved. Pertinent negatives include no chest pain, no abdominal pain and no headaches. Nothing aggravates the symptoms. Nothing relieves the symptoms. She has tried nothing for the symptoms.    Past Medical History:  Diagnosis Date  . Anxiety   . Arthritis   . Asthmatic bronchitis   . Atrial fibrillation ([place])   . Bowel obstruction (HCC)    blockage  . CAD (coronary artery disease)    Stent to RI 2003.  Myoview 2013 no ischemia.  . Cataract   . Chronic back pain   . Chronic bronchitis ([place])   . Colon polyp    adenomatous  . Congestive heart disease (HCC)    Preserved EF  . Depression   . Esophageal motility disorder   . Fibromyalgia   . GERD (gastroesophageal reflux disease)   . History of hiatal hernia   . History of kidney stones   . Hyperlipidemia   . Hypertension   . Hypothyroid   . 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 Myocardial infarction ([place])    2000  . Nephrolithiasis   . OA (osteoarthritis)   . Status post dilation of esophageal narrowing     Patient Active Problem List   Diagnosis Date Noted  . Snoring 02/19/2018  . Other fatigue 02/19/2018  . Chronic pain 07/02/2017  . Vocal cord dysfunction 02/04/2017  . Irritable bowel syndrome with diarrhea 12/11/2015  . UTI (urinary tract infection) 08/22/2015  . Cough 07/26/2015  .  Diarrhea 11/30/2014  . Heme + stool 11/30/2014  . Chronic anticoagulation 11/30/2014  . CHF (congestive heart failure) ([place]) 04/30/2014  . 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 Osteoporosis 01/27/2014  . Fibromyalgia 08/09/2013  . Asthma 08/01/2010  . Hypothyroidism 03/20/2010  . Depression 03/20/2010  . HTN (hypertension) 03/20/2010  . ESOPHAGEAL MOTILITY DISORDER 03/20/2010  . Hyperlipidemia 04/22/2009  . Coronary atherosclerosis 04/22/2009  . ATRIAL FIBRILLATION, PAROXYSMAL 04/22/2009  . ESOPHAGEAL STRICTURE 10/31/2004  . GERD 10/31/2004  . HIATAL HERNIA 10/06/2001    Past Surgical History:  Procedure Laterality Date  . ABDOMINAL HYSTERECTOMY     partial  . ANKLE FUSION  08/27/2012   Procedure: ARTHRODESIS ANKLE;  Surgeon: Wylene Simmer, MD;  Location: [place];  Service: Orthopedics;  Laterality: Right;  Arthrodesis right ankle and subtalar joint  . APPENDECTOMY    . ARTHRODESIS TIBIOFIBULAR Right 03/31/2014   Procedure: REVISION OF SUBTALOR ARTHRODESIS  RIGHT ;  Surgeon: Wylene Simmer, MD;  Location: [place];  Service: Orthopedics;  Laterality: Right;  . BACK SURGERY    . BREAST SURGERY     Left breast lump removed  . BRONCHOSCOPY    . CORONARY ANGIOPLASTY WITH STENT PLACEMENT  2003  . DECORTICATION Right 05/03/2014   Procedure: DECORTICATION;  Surgeon: [name], MD;  Location: [facility] OR;  Service: Thoracic;  Laterality: Right;  . EYE SURGERY  2016   cateracts  . FOOT ARTHRODESIS, SUBTALAR Right 2013  . HARDWARE REMOVAL Right 03/31/2014   Procedure: REMOVAL OF DEEP IMPLANTS X 3  RIGHT ;  Surgeon: Wylene Simmer, MD;  Location: [place];  Service: Orthopedics;  Laterality: Right;  . HARDWARE REMOVAL Right 11/03/2014   Procedure: HARDWARE REMOVAL OF SUBTALAR JOINT;  Surgeon: Wylene Simmer, MD;  Location: [place];  Service: Orthopedics;  Laterality: Right;  . HARDWARE REVISION  03/31/2014   SUBTALOR  ARTHRODESIS       DR HEWITT  . JOINT REPLACEMENT     Right  knee  . KNEE ARTHROSCOPY  03/26/2012   Procedure: ARTHROSCOPY KNEE;  Surgeon: Wylene Simmer, MD;  Location: [place];  Service: Orthopedics;  Laterality: Left;  with Debridement of Lateral Meniscus tear  . REMOVAL OF IMPLANT Right 03/31/2014   DR HEWITT  . ROTATOR CUFF REPAIR Bilateral   . SINUS SURGERY WITH INSTATRAK    . SPINAL CORD STIMULATOR INSERTION N/A 07/02/2017   Procedure: LUMBAR SPINAL CORD STIMULATOR INSERTION;  Surgeon: Melina Schools, MD;  Location: [place];  Service: Orthopedics;  Laterality: N/A;  120 mins  . TOTAL KNEE ARTHROPLASTY Right   . VIDEO ASSISTED THORACOSCOPY (VATS)/EMPYEMA Right 05/03/2014   Procedure: VIDEO ASSISTED THORACOSCOPY (VATS)/EMPYEMA;  Surgeon: [name], MD;  Location: [place];  Service: Thoracic;  Laterality: Right;  Marland Kitchen VIDEO BRONCHOSCOPY N/A 05/03/2014   Procedure: VIDEO BRONCHOSCOPY;  Surgeon: [name], MD;  Location: [facility] OR;  Service: Thoracic;  Laterality: N/A;     OB History   None      Home Medications    Prior to Admission medications   Medication Sig Start Date End Date Taking? Authorizing Provider  acetaminophen (TYLENOL) 650 MG CR tablet Take 650-1,300 mg by mouth every 8 (eight) hours as needed for pain. Depends on pain level if takes 1-2 tablets    [provider]  ALPRAZolam (XANAX) 0.5 MG tablet Take 2 tablets (1 mg total) by mouth at bedtime. 04/07/18   Chipper Herb, MD  Ascorbic Acid (VITAMIN C) 1000 MG tablet Take 1,000 mg by mouth daily.    [provider]  beta carotene 25000 UNIT capsule Take 25,000 Units by mouth daily.    [provider]  budesonide (PULMICORT) 0.5 MG/2ML nebulizer solution Take 2 mLs (0.5 mg total) by nebulization 2 (two) times daily. Dx 496 Patient taking differently: Take 0.5 mg by nebulization 2 (two) times daily as needed (shortness of breath). Dx 496 02/04/17   Collene Gobble, MD  Calcium Carbonate-Vitamin D (CALCIUM 600+D) 600-400 MG-UNIT per tablet Take 1 tablet by  mouth daily at 6 PM.     [provider]  cholecalciferol (VITAMIN D) 1000 UNITS tablet Take 1,000 Units by mouth 2 (two) times daily with breakfast and lunch.     [provider]  Coenzyme Q10 (COQ10) 100 MG CAPS Take 100 mg by mouth daily.    [provider]  dicyclomine (BENTYL) 10 MG capsule TAKE (1) OR (2) CAPSULES EVERY SIX HOURS AS NEEDED FOR SPASM 02/11/18   Chipper Herb, MD  diltiazem (CARDIZEM CD) 120 MG 24 hr capsule TAKE (1) CAPSULE DAILY 04/14/18   Minus Breeding, MD  fluticasone (FLONASE) 50 MCG/ACT nasal spray 2 SPRAYS IN EACH NOSTRIL ONCE A DAY Patient taking differently: 2 SPRAYS IN EACH NOSTRIL ONCE A DAY AS NEEDED FOR ALLERGIES  05/07/17   Chipper Herb, MD  HYDROcodone-acetaminophen (NORCO) 5-325 MG tablet Take 1 tablet by mouth every 4 (four) hours as needed. 07/03/17   Melina Schools, MD  levothyroxine (SYNTHROID, LEVOTHROID) 25 MCG tablet TAKE 2 TABLETS DAILY AS DIRECTED 02/16/18   Chipper Herb, MD  Liniments (ANALGESIC EX) Apply 1 application topically 3 (three) times daily as needed (pain).    [provider]  loratadine (CLARITIN) 10 MG tablet Take 1 tablet (10 mg total) by mouth daily. 02/16/18   Chipper Herb, MD  meclizine (ANTIVERT) 25 MG tablet TAKE (1) TABLET THREE TIMES DAILY AS NEEDED FOR DIZZINESS. 12/20/16   Chipper Herb, MD  Melatonin 5 MG TABS Take 10 mg by mouth every evening.    [provider]  metoprolol tartrate (LOPRESSOR) 25 MG tablet Take 2 tablets each morning and 1 tablet each evening 02/12/18   Minus Breeding, MD  montelukast (SINGULAIR) 10 MG tablet Take 1 tablet (10 mg total) by mouth daily. 02/16/18   Chipper Herb, MD  nitroGLYCERIN (NITROSTAT) 0.4 MG SL tablet 1 tablet under the tongue every 5 minutes as needed for chest pain. 11/06/16   Chipper Herb, MD  omega-3 acid ethyl esters (LOVAZA) 1 g capsule TAKE (2) CAPSULES TWICE DAILY. 02/16/18   Chipper Herb, MD  omeprazole-sodium bicarbonate  (ZEGERID) 40-1100 MG capsule Take 1 capsule by mouth daily. 11/20/17   Levin Erp, PA  PROAIR HFA 108 [id number] Base) MCG/ACT inhaler USE 2 PUFFS EVERY 6 HOURS AS NEEDED FOR WHEEZING 12/20/16   Chipper Herb, MD  PROLIA 60 MG/ML SOSY injection Inject 60 mg every 6 (six) months into the skin. Administer in upper arm, thigh, or abdomen 04/14/18   Chipper Herb, MD  rosuvastatin (CRESTOR) 20 MG tablet Take 1 tablet (20 mg total) by mouth daily. 02/16/18   Chipper Herb, MD  sertraline (ZOLOFT) 100 MG tablet Take 1 tablet (100 mg total) by mouth daily. 03/03/18   Chipper Herb, MD  tamsulosin [facility]) 0.4 MG CAPS capsule Take 1 capsule (0.4 mg total) by mouth daily after breakfast. 12/12/17   Chipper Herb, MD  XARELTO 20 MG TABS tablet Take 1 tablet (20 mg total) by mouth daily. 02/16/18   Chipper Herb, MD    Family History Family History  Problem Relation Age of Onset  . Prostate cancer Brother   . Cancer Brother        PROSTATE  . Heart disease Mother   . Asthma Mother   . Congestive Heart Failure Mother   . Emphysema Sister   . COPD Sister   . Stroke Sister   . Heart disease Sister   . Emphysema Brother   . Rheumatologic disease Neg Hx     Social History Social History   Tobacco Use  . Smoking status: Former Smoker    Packs/day: 1.00    Years: 30.00    Pack years: 30.00    Types: Cigarettes    Last attempt to quit: 12/16/1989    Years since quitting: 28.4  . Smokeless tobacco: Never Used  . Tobacco comment: smoked off & on  Substance Use Topics  . Alcohol use: No    Alcohol/week: 0.0 oz  . Drug use: No     Allergies   Naproxen; Penicillins; Sulfonamide derivatives; Aspirin; Captopril; and Clindamycin/lincomycin   Review of Systems Review of Systems  Constitutional: Negative for appetite change and fatigue.  HENT: Negative for congestion, ear  discharge and sinus pressure.   Eyes: Negative for discharge.  Respiratory: Negative for cough.   Cardiovascular:  Negative for chest pain.  Gastrointestinal: Negative for abdominal pain and diarrhea.  Genitourinary: Negative for frequency and hematuria.  Musculoskeletal: Negative for back pain.  Skin: Negative for rash.  Neurological: Negative for seizures and headaches.  Psychiatric/Behavioral: Negative for hallucinations.     Physical Exam Updated Vital Signs BP 139/71 (BP Location: Right Arm)   Pulse (!) 57   Temp 97.8 F (36.6 C) (Oral)   Resp 18   Ht 5\' 1"  (1.549 m)   Wt 68 kg (150 lb)   SpO2 97%   BMI 28.34 kg/m   Physical Exam  Constitutional: She is oriented to person, place, and time. She appears well-developed.  HENT:  Head: Normocephalic.  Mild bruising left forehead  Eyes: Conjunctivae and EOM are normal. No scleral icterus.  Neck: Neck supple. No thyromegaly present.  Cardiovascular: Normal rate and regular rhythm. Exam reveals no gallop and no friction rub.  No murmur heard. Pulmonary/Chest: No stridor. She has no wheezes. She has no rales. She exhibits no tenderness.  Abdominal: She exhibits no distension. There is no tenderness. There is no rebound.  Musculoskeletal: Normal range of motion. She exhibits no edema.  Lymphadenopathy:    She has no cervical adenopathy.  Neurological: She is oriented to person, place, and time. She exhibits normal muscle tone. Coordination abnormal.  Skin: No rash noted. No erythema.  Psychiatric: She has a normal mood and affect. Her behavior is normal.     ED Treatments / Results  Labs (all labs ordered are listed, but only abnormal results are displayed) Labs Reviewed - No data to display  EKG None  Radiology Ct Head Wo Contrast  Result Date: 05/05/2018 CLINICAL DATA:  Patient fell in kitchen today at 1400 hours. Patient is on blood thinners. EXAM: CT HEAD WITHOUT CONTRAST TECHNIQUE: Contiguous axial images were obtained from the base of the skull through the vertex without intravenous contrast. COMPARISON:  04/09/2007  FINDINGS: Brain: Chronic moderate small vessel ischemic disease of periventricular and subcortical white matter. Age-appropriate involutional changes of the brain without hydrocephalus. Midline fourth ventricle and basal cisterns. No large vascular territory infarct, intracranial hemorrhage, midline shift or edema. No intra-axial mass nor extra-axial fluid collections. Vascular: No hyperdense vessel sign. Moderate atherosclerosis of the cavernous carotids. Skull: No acute calvarial fracture. Sinuses/Orbits: Intact orbits and globes. Clear included paranasal sinuses and mastoids. Other: None IMPRESSION: 1. Moderate small vessel ischemic disease of periventricular and subcortical white matter. 2. No acute intracranial abnormality nor skull fracture. Electronically Signed   By: Ashley Royalty M.D.   On: 05/05/2018 18:48    Procedures Procedures (including critical care time)  Medications Ordered in ED Medications - No data to display   Initial Impression / Assessment and Plan / ED Course  I have reviewed the triage vital signs and the nursing notes.  Pertinent labs & imaging results that were available during my care of the patient were reviewed by me and considered in my medical decision making (see chart for details).   Patient with contusion to head.  CT scan head is negative.  Patient will follow-up with her primary care doctor   Final Clinical Impressions(s) / ED Diagnoses   Final diagnoses:  Fall, initial encounter    ED Discharge Orders    None       Milton Ferguson, MD 05/05/18 2045

## 2018-05-06 ENCOUNTER — Ambulatory Visit (INDEPENDENT_AMBULATORY_CARE_PROVIDER_SITE_OTHER): Payer: Medicare Other | Admitting: *Deleted

## 2018-05-06 DIAGNOSIS — M810 Age-related osteoporosis without current pathological fracture: Secondary | ICD-10-CM

## 2018-05-06 MED ORDER — DENOSUMAB 60 MG/ML ~~LOC~~ SOSY
60.0000 mg | PREFILLED_SYRINGE | Freq: Once | SUBCUTANEOUS | Status: AC
  Administered 2018-05-06: 60 mg via SUBCUTANEOUS

## 2018-05-06 NOTE — Progress Notes (Signed)
Prolia inj given R arm Pt tolerated well Next due 11/07/2018 Last dexa scan 10/03/2016 Next due 09/2018 Pt gets med from specialty pharmacy

## 2018-05-14 ENCOUNTER — Other Ambulatory Visit: Payer: Self-pay | Admitting: Family Medicine

## 2018-05-14 ENCOUNTER — Other Ambulatory Visit: Payer: Self-pay | Admitting: Cardiology

## 2018-05-14 NOTE — Telephone Encounter (Signed)
Rx sent to pharmacy   

## 2018-05-19 ENCOUNTER — Telehealth: Payer: Self-pay | Admitting: Family Medicine

## 2018-05-19 NOTE — Telephone Encounter (Signed)
lmtcb

## 2018-05-20 ENCOUNTER — Ambulatory Visit (INDEPENDENT_AMBULATORY_CARE_PROVIDER_SITE_OTHER): Payer: Medicare Other | Admitting: Family Medicine

## 2018-05-20 ENCOUNTER — Ambulatory Visit (INDEPENDENT_AMBULATORY_CARE_PROVIDER_SITE_OTHER): Payer: Medicare Other

## 2018-05-20 ENCOUNTER — Encounter: Payer: Self-pay | Admitting: Family Medicine

## 2018-05-20 VITALS — BP 135/71 | HR 64 | Temp 97.3°F | Ht 61.0 in | Wt 153.0 lb

## 2018-05-20 DIAGNOSIS — M545 Low back pain: Secondary | ICD-10-CM | POA: Diagnosis not present

## 2018-05-20 DIAGNOSIS — W19XXXA Unspecified fall, initial encounter: Secondary | ICD-10-CM

## 2018-05-20 DIAGNOSIS — M546 Pain in thoracic spine: Secondary | ICD-10-CM | POA: Diagnosis not present

## 2018-05-20 DIAGNOSIS — G9689 Other specified disorders of central nervous system: Secondary | ICD-10-CM

## 2018-05-20 DIAGNOSIS — G968 Other specified disorders of central nervous system: Secondary | ICD-10-CM

## 2018-05-20 NOTE — Progress Notes (Signed)
Subjective: CC: back pain PCP: Chipper Herb, MD [name] is a 82 y.o. female presenting to clinic today for:  1. Back pain/chronic back cyst Patient sustained a fall on 05/05/2018.  She was evaluated in the emergency department and had a CT of her head, which was negative for acute intracranial processes.  She presents today and notes that she actually sustained another fall later that week.  She reports having fallen backwards onto her kitchen cabinet stating that she hit her mid and low back on the way down.  She notes that she has had pain within the back that is getting better but still present and more than what her baseline back pain is.  She reports the pain is worse with trying to turn over in the bed.  She has been using heat, Tylenol arthritis and Norco for symptoms.  She does report improvement in her symptoms with these therapies but states that she is trying not to use the Norco much because she is afraid that it will make her drowsy and fall more.  No changes in sensation, saddle anesthesia, urinary retention or fecal incontinence.  At baseline, she requires an assisted device for ambulation.  She has significant orthopedic history including presence of spinal stimulator.  She has seen Dr. Melina Schools in the past.  She notes that she was actually referred to a different neurosurgeon for evaluation of a cyst that she had on her spine.  She reports to me today that she has a cyst on the inside of her spine and used to have one on the outside but this was surgically removed.  She would like to be seen by the neurosurgeon at [facility] in [place] for further discussion of the internal cyst, as she was told by the previous surgeon that he is hesitant to operate on her because "he does not want to paralyze her".  She wonders if this is has anything to do with her recurrent falls.  She also has a right foot that turns in.     ROS: Per  HPI  Allergies  Allergen Reactions  . Naproxen Other (See Comments)    Tongue swelling  . Penicillins Swelling    Swelling around site Has patient had a PCN reaction causing immediate rash, facial/tongue/throat swelling, SOB or lightheadedness with hypotension: Yes Has patient had a PCN reaction causing severe rash involving mucus membranes or skin necrosis: No Has patient had a PCN reaction that required hospitalization: No Has patient had a PCN reaction occurring within the last 10 years: No If all of the above answers are "NO", then may proceed with Cephalosporin use.   . Sulfonamide Derivatives Hives  . Aspirin Other (See Comments)    REACTION: regular strength causes "heart to beat fast"  . Captopril Hypertension  . Clindamycin/Lincomycin Other (See Comments)    unknown   Past Medical History:  Diagnosis Date  . Anxiety   . Arthritis   . Asthmatic bronchitis   . Atrial fibrillation ([place])   . Bowel obstruction ([facility])    blockage  . CAD (coronary artery disease)    Stent to RI 2003.  Myoview 2013 no ischemia.  . Cataract   . Chronic back pain   . Chronic bronchitis ([place])   . Colon polyp    adenomatous  . Congestive heart disease ([facility])    Preserved EF  . Depression   . Esophageal motility disorder   . Fibromyalgia   .  GERD (gastroesophageal reflux disease)   . History of hiatal hernia   . History of kidney stones   . Hyperlipidemia   . Hypertension   . Hypothyroid   . 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 Myocardial infarction ([place])    2000  . Nephrolithiasis   . OA (osteoarthritis)   . Status post dilation of esophageal narrowing     Current Outpatient Medications:  .  acetaminophen (TYLENOL) 650 MG CR tablet, Take 650-1,300 mg by mouth every 8 (eight) hours as needed for pain. Depends on pain level if takes 1-2 tablets, Disp: , Rfl:  .  ALPRAZolam (XANAX) 0.5 MG tablet, Take 2 tablets (1 mg total) by mouth at bedtime., Disp: 60  tablet, Rfl: 5 .  Ascorbic Acid (VITAMIN C) 1000 MG tablet, Take 1,000 mg by mouth daily., Disp: , Rfl:  .  beta carotene 25000 UNIT capsule, Take 25,000 Units by mouth daily., Disp: , Rfl:  .  budesonide (PULMICORT) 0.5 MG/2ML nebulizer solution, Take 2 mLs (0.5 mg total) by nebulization 2 (two) times daily. Dx 496 (Patient taking differently: Take 0.5 mg by nebulization 2 (two) times daily as needed (shortness of breath). Dx 496), Disp: 120 mL, Rfl: 6 .  Calcium Carbonate-Vitamin D (CALCIUM 600+D) 600-400 MG-UNIT per tablet, Take 1 tablet by mouth daily at 6 PM. , Disp: , Rfl:  .  cholecalciferol (VITAMIN D) 1000 UNITS tablet, Take 1,000 Units by mouth 2 (two) times daily with breakfast and lunch. , Disp: , Rfl:  .  Coenzyme Q10 (COQ10) 100 MG CAPS, Take 100 mg by mouth daily., Disp: , Rfl:  .  dicyclomine (BENTYL) 10 MG capsule, TAKE (1) OR (2) CAPSULES EVERY SIX HOURS AS NEEDED FOR SPASM, Disp: 60 capsule, Rfl: 2 .  diltiazem (CARDIZEM CD) 120 MG 24 hr capsule, TAKE (1) CAPSULE DAILY, Disp: 30 capsule, Rfl: 10 .  fluticasone (FLONASE) 50 MCG/ACT nasal spray, 2 SPRAYS IN EACH NOSTRIL ONCE A DAY (Patient taking differently: 2 SPRAYS IN EACH NOSTRIL ONCE A DAY AS NEEDED FOR ALLERGIES), Disp: 16 g, Rfl: 5 .  HYDROcodone-acetaminophen (NORCO) 5-325 MG tablet, Take 1 tablet by mouth every 4 (four) hours as needed., Disp: 90 tablet, Rfl: 0 .  levothyroxine (SYNTHROID, LEVOTHROID) 25 MCG tablet, TAKE 2 TABLETS DAILY AS DIRECTED, Disp: 180 tablet, Rfl: 2 .  Liniments (ANALGESIC EX), Apply 1 application topically 3 (three) times daily as needed (pain)., Disp: , Rfl:  .  loratadine (CLARITIN) 10 MG tablet, Take 1 tablet (10 mg total) by mouth daily., Disp: 90 tablet, Rfl: 0 .  meclizine (ANTIVERT) 25 MG tablet, TAKE (1) TABLET THREE TIMES DAILY AS NEEDED FOR DIZZINESS., Disp: 30 tablet, Rfl: 0 .  Melatonin 5 MG TABS, Take 10 mg by mouth every evening., Disp: , Rfl:  .  metoprolol tartrate (LOPRESSOR) 25 MG  tablet, Take 2 tablets each morning and 1 tablet each evening, Disp: 90 tablet, Rfl: 2 .  montelukast (SINGULAIR) 10 MG tablet, Take 1 tablet (10 mg total) by mouth daily., Disp: 90 tablet, Rfl: 0 .  montelukast (SINGULAIR) 10 MG tablet, Take 1 tablet (10 mg total) by mouth daily., Disp: 90 tablet, Rfl: 1 .  nitroGLYCERIN (NITROSTAT) 0.4 MG SL tablet, 1 tablet under the tongue every 5 minutes as needed for chest pain., Disp: 25 tablet, Rfl: 1 .  omega-3 acid ethyl esters (LOVAZA) 1 g capsule, TAKE (2) CAPSULES TWICE DAILY., Disp: 360 capsule, Rfl: 1 .  omeprazole-sodium bicarbonate (ZEGERID) 40-1100 MG capsule, Take 1 capsule by mouth daily., Disp: 90 capsule, Rfl: 3 .  PROAIR HFA 108 (90 Base) MCG/ACT inhaler, USE 2 PUFFS EVERY 6 HOURS AS NEEDED FOR WHEEZING, Disp: 8.5 g, Rfl: 0 .  PROLIA 60 MG/ML SOSY injection, Inject 60 mg every 6 (six) months into the skin. Administer in upper arm, thigh, or abdomen, Disp: 1 mL, Rfl: 0 .  rosuvastatin (CRESTOR) 20 MG tablet, Take 1 tablet (20 mg total) by mouth daily., Disp: 90 tablet, Rfl: 1 .  sertraline (ZOLOFT) 100 MG tablet, Take 1 tablet (100 mg total) by mouth daily., Disp: 90 tablet, Rfl: 0 .  tamsulosin (FLOMAX) 0.4 MG CAPS capsule, Take 1 capsule (0.4 mg total) by mouth daily after breakfast., Disp: 30 capsule, Rfl: 5 .  XARELTO 20 MG TABS tablet, Take 1 tablet (20 mg total) by mouth daily., Disp: 90 tablet, Rfl: 0 Social History   Socioeconomic History  . Marital status: Widowed    Spouse name: Not on file  . Number of children: 2  . Years of education: Not on file  . Highest education level: Not on file  Occupational History  . Occupation: Retired  Scientific laboratory technician  . Financial resource strain: Not on file  . Food insecurity:    Worry: Not on file    Inability: Not on file  . Transportation needs:    Medical: Not on file    Non-medical: Not on file  Tobacco Use  . Smoking status: Former Smoker    Packs/day: 1.00    Years: 30.00    Pack  years: 30.00    Types: Cigarettes    Last attempt to quit: 12/16/1989    Years since quitting: 28.4  . Smokeless tobacco: Never Used  . Tobacco comment: smoked off & on  Substance and Sexual Activity  . Alcohol use: No    Alcohol/week: 0.0 oz  . Drug use: No  . Sexual activity: Never  Lifestyle  . Physical activity:    Days per week: Not on file    Minutes per session: Not on file  . Stress: Not on file  Relationships  . Social connections:    Talks on phone: Not on file    Gets together: Not on file    Attends religious service: Not on file    Active member of club or organization: Not on file    Attends meetings of clubs or organizations: Not on file    Relationship status: Not on file  . Intimate partner violence:    Fear of current or ex partner: Not on file    Emotionally abused: Not on file    Physically abused: Not on file    Forced sexual activity: Not on file  Other Topics Concern  . Not on file  Social History Narrative   Originally from [place]. Always lived in [place]. No international travel. Has prior travel to [place], [place], [place], [place], [place], & [place]. No recent travel. She has a cat currently. No prior bird, mold, or hot tub exposure. 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 No known asbestos exposure. 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    Family History  Problem Relation Age of Onset  . Prostate cancer Brother   . Cancer Brother        PROSTATE  . Heart disease Mother   . Asthma Mother   . Congestive Heart  Failure Mother   . Emphysema Sister   . COPD Sister   . Stroke Sister   . Heart disease Sister   . Emphysema Brother   . Rheumatologic disease Neg Hx     Objective: Office vital signs reviewed. BP 135/71   Pulse 64   Temp (!) 97.3 F (36.3 C) (Oral)   Ht 5\' 1"  (1.549 m)   Wt 153 lb (69.4 kg)   BMI 28.91 kg/m   Physical Examination:  General: Awake, alert, No acute  distress Extremities: warm, well perfused, No edema, cyanosis or clubbing MSK: uses cane for ambulation.  Currently here in office with wheelchair.  Thoracic spine; increased kyphotic curve.  She has some scoliosis appreciated within the thoracolumbar areas.  There is no midline tenderness to palpation throughout thoracic spine.  No paraspinal muscle tenderness to palpation.  Active range of motion is limited.  Lumbar spine: Flattening of lumbar spine appreciated.  Again, scoliotic curve noted.  No midline tenderness to palpation.  No paraspinal muscle tenderness to palpation.  Limited active range of motion. Neuro: Light touch sensation grossly intact.  Dg Thoracic Spine 2 View  Result Date: 05/20/2018 CLINICAL DATA:  Multiple falls over the past year, low back pain after falling against counter EXAM: THORACIC SPINE 2 VIEWS COMPARISON:  MR thoracic spine 03/24/2017 FINDINGS: Diffuse osseous demineralization. Twelve pairs of ribs. Superior endplate compression fracture of T11 vertebral body new since prior MR with approximately 20% anterior height loss. Remaining vertebral body heights maintained. Scattered mild disc space narrowing at multiple levels in the midthoracic spine. No additional fracture or subluxation. Intraspinal stimulator identified at T8-T10. IMPRESSION: Superior endplate compression fracture of T11 vertebral body with 20% anterior height loss new since 2018. Osseous demineralization. Electronically Signed   By: Lavonia Dana M.D.   On: 05/20/2018 15:26   Dg Lumbar Spine 2-3 Views  Result Date: 05/20/2018 CLINICAL DATA:  Multiple falls over the past year, low back pain after falling against counter EXAM: LUMBAR SPINE - 2-3 VIEW COMPARISON:  MRI 03/24/2017 FINDINGS: Diffuse osseous demineralization. Five non-rib-bearing lumbar vertebra. Disc space narrowing endplate spur formation with discogenic sclerosis at L2-L3. Disc space narrowing L5-S1. Superior endplate compression deformity of T11  vertebral body with approximately 20% anterior height loss, appears new since prior MR. No additional fracture, subluxation, or bone destruction. Atherosclerotic calcifications aorta. SI joints grossly preserved. IMPRESSION: Osseous demineralization with mild superior endplate compression fracture of T11 vertebral body with approximately 20% anterior height loss new since 03/24/2017 Electronically Signed   By: Lavonia Dana M.D.   On: 05/20/2018 15:21   Ct Head Wo Contrast  Result Date: 05/05/2018 CLINICAL DATA:  Patient fell in kitchen today at 1400 hours. Patient is on blood thinners. EXAM: CT HEAD WITHOUT CONTRAST TECHNIQUE: Contiguous axial images were obtained from the base of the skull through the vertex without intravenous contrast. COMPARISON:  04/09/2007 FINDINGS: Brain: Chronic moderate small vessel ischemic disease of periventricular and subcortical white matter. Age-appropriate involutional changes of the brain without hydrocephalus. Midline fourth ventricle and basal cisterns. No large vascular territory infarct, intracranial hemorrhage, midline shift or edema. No intra-axial mass nor extra-axial fluid collections. Vascular: No hyperdense vessel sign. Moderate atherosclerosis of the cavernous carotids. Skull: No acute calvarial fracture. Sinuses/Orbits: Intact orbits and globes. Clear included paranasal sinuses and mastoids. Other: None IMPRESSION: 1. Moderate small vessel ischemic disease of periventricular and subcortical white matter. 2. No acute intracranial abnormality nor skull fracture. Electronically Signed   By: Meredith Leeds.D.  On: 05/05/2018 18:48   Assessment/ Plan: 82 y.o. female   1. Acute bilateral low back pain, with sciatica presence unspecified No acute findings on physical exam today.  Patient with chronic low back pain and presence of spinal cord stim .  I did agree with her using the Norco sparingly so as to reduce the falls.  I discussed her care with her PCP, who notes  that falls tend to be a result of her foot being turned in.  She has had multiple back issues throughout the years.  Her x-rays today did demonstrate a compression fracture within the T11 vertebral body that seems to have had a 20% height loss compared to her studies in 2018.  No acute processes were reported.  Again, seen by Melina Schools in the past.  Cannot find records as to what neurosurgery and he recommended she see in the past.  I did find a scanned MRI from April 2018 which did note a cyst like mass with in the back.  I have placed the referral to the neurosurgeon that she requested.  Would recommend sending that MRI to them.  Will defer further interventions to her PCP. - DG Lumbar Spine 2-3 Views; Future  2. Fall, initial encounter I recommended that she use her rolling walker exclusively so as to reduce falls. - DG Lumbar Spine 2-3 Views; Future - DG Thoracic Spine 2 View; Future  3. Acute bilateral thoracic back pain - DG Thoracic Spine 2 View; Future  4. Spinal cord cysts - Ambulatory referral to Neurosurgery   Orders Placed This Encounter  Procedures  . DG Lumbar Spine 2-3 Views    Standing Status:   Future    Number of Occurrences:   1    Standing Expiration Date:   07/20/2019    Order Specific Question:   Reason for Exam (SYMPTOM  OR DIAGNOSIS REQUIRED)    Answer:   low back pain after fall against counter.    Order Specific Question:   Preferred imaging location?    Answer:   Internal  . DG Thoracic Spine 2 View    Standing Status:   Future    Number of Occurrences:   1    Standing Expiration Date:   07/20/2019    Order Specific Question:   Reason for Exam (SYMPTOM  OR DIAGNOSIS REQUIRED)    Answer:   fall w/ back pain    Order Specific Question:   Preferred imaging location?    Answer:   Internal  . Ambulatory referral to Neurosurgery    Referral Priority:   Routine    Referral Type:   Surgical    Referral Reason:   Specialty Services Required    Requested  Specialty:   Neurosurgery    Number of Visits Requested:   1   No orders of the defined types were placed in this encounter.    Janora Norlander, DO [place] [phone]

## 2018-05-20 NOTE — Patient Instructions (Signed)
I have placed the referral to the [facility] center per your request.  You will be contacted for an appointment.  I would like you to keep your June appointment with neurosurgery while you are awaiting the new appointment.  Please make sure that you follow-up with Dr. Laurance Flatten in the next couple of months for recheck.  I want to use your rolling walker exclusively going forward given your recurrent falls.  I will contact you with the results of your x-rays once they are available.

## 2018-05-25 ENCOUNTER — Other Ambulatory Visit: Payer: Self-pay | Admitting: Family Medicine

## 2018-05-25 ENCOUNTER — Other Ambulatory Visit: Payer: Self-pay | Admitting: Nurse Practitioner

## 2018-05-26 ENCOUNTER — Telehealth: Payer: Self-pay | Admitting: Cardiology

## 2018-05-26 DIAGNOSIS — R002 Palpitations: Secondary | ICD-10-CM

## 2018-05-26 NOTE — Telephone Encounter (Signed)
New message    Patient is calling about fatigue, she is extremely tired with a jittery feeling in her chest and stomach. She would like Dr Percival Spanish to give her something to make her feel better. She is taking Miralax everyday because her bowels were impacted, and it seems to help.  She also thinks her blood pressure is fluctuating top number ranges from 160 to 116.   Please call in the afternoon around 4pm to speak with her, she has to go run errands.

## 2018-05-27 NOTE — Telephone Encounter (Signed)
Spoke with pt and advised of Dr. Rosezella Florida recommendation. Pt verbalized understanding. Order placed. Staff message sent to scheduling to schedule appointment.

## 2018-05-27 NOTE — Telephone Encounter (Signed)
I will need to see if the jittery feeling has anything to do with her heart rhythm.  Please apply an event monitor.  [name] will need a 21 day event monitor.  The patients symptoms necessitate an event monitor.  The symptoms are too infrequent to be identified on a Holter monitor.

## 2018-05-27 NOTE — Telephone Encounter (Signed)
Return call to pt just to inform that message has be routed to Dr. Percival Spanish yesterday and we will be contacting her with his recommendations. Pt verbalized understanding.

## 2018-05-27 NOTE — Telephone Encounter (Signed)
Patient was seen 05/20/18.

## 2018-05-28 ENCOUNTER — Telehealth: Payer: Self-pay | Admitting: Family Medicine

## 2018-05-28 NOTE — Telephone Encounter (Signed)
Pt aware that referral has been made 

## 2018-05-29 ENCOUNTER — Other Ambulatory Visit: Payer: Self-pay | Admitting: *Deleted

## 2018-05-29 MED ORDER — BENZONATATE 100 MG PO CAPS: ORAL_CAPSULE | ORAL | 1 refills | Status: DC

## 2018-06-08 ENCOUNTER — Ambulatory Visit (INDEPENDENT_AMBULATORY_CARE_PROVIDER_SITE_OTHER): Payer: Medicare Other

## 2018-06-08 DIAGNOSIS — R002 Palpitations: Secondary | ICD-10-CM

## 2018-06-12 ENCOUNTER — Other Ambulatory Visit: Payer: Self-pay | Admitting: Family Medicine

## 2018-07-06 DIAGNOSIS — G8929 Other chronic pain: Secondary | ICD-10-CM | POA: Diagnosis not present

## 2018-07-06 DIAGNOSIS — M546 Pain in thoracic spine: Secondary | ICD-10-CM | POA: Diagnosis not present

## 2018-07-06 DIAGNOSIS — M545 Low back pain: Secondary | ICD-10-CM | POA: Diagnosis not present

## 2018-07-06 DIAGNOSIS — Z9689 Presence of other specified functional implants: Secondary | ICD-10-CM | POA: Diagnosis not present

## 2018-07-22 ENCOUNTER — Other Ambulatory Visit: Payer: Self-pay | Admitting: Family Medicine

## 2018-08-07 ENCOUNTER — Ambulatory Visit: Payer: Medicare Other | Admitting: Family Medicine

## 2018-08-10 ENCOUNTER — Ambulatory Visit (INDEPENDENT_AMBULATORY_CARE_PROVIDER_SITE_OTHER): Payer: Medicare Other | Admitting: Family Medicine

## 2018-08-10 ENCOUNTER — Other Ambulatory Visit: Payer: Self-pay | Admitting: Family Medicine

## 2018-08-10 ENCOUNTER — Encounter: Payer: Self-pay | Admitting: Family Medicine

## 2018-08-10 VITALS — BP 121/64 | HR 59 | Temp 97.3°F | Ht 61.0 in | Wt 151.0 lb

## 2018-08-10 DIAGNOSIS — R2681 Unsteadiness on feet: Secondary | ICD-10-CM

## 2018-08-10 DIAGNOSIS — M5440 Lumbago with sciatica, unspecified side: Secondary | ICD-10-CM | POA: Diagnosis not present

## 2018-08-10 DIAGNOSIS — H6121 Impacted cerumen, right ear: Secondary | ICD-10-CM

## 2018-08-10 DIAGNOSIS — I10 Essential (primary) hypertension: Secondary | ICD-10-CM | POA: Diagnosis not present

## 2018-08-10 DIAGNOSIS — R4589 Other symptoms and signs involving emotional state: Secondary | ICD-10-CM

## 2018-08-10 DIAGNOSIS — E559 Vitamin D deficiency, unspecified: Secondary | ICD-10-CM

## 2018-08-10 DIAGNOSIS — E7800 Pure hypercholesterolemia, unspecified: Secondary | ICD-10-CM

## 2018-08-10 DIAGNOSIS — G9689 Other specified disorders of central nervous system: Secondary | ICD-10-CM

## 2018-08-10 DIAGNOSIS — M810 Age-related osteoporosis without current pathological fracture: Secondary | ICD-10-CM

## 2018-08-10 DIAGNOSIS — K219 Gastro-esophageal reflux disease without esophagitis: Secondary | ICD-10-CM

## 2018-08-10 DIAGNOSIS — G968 Other specified disorders of central nervous system: Secondary | ICD-10-CM

## 2018-08-10 DIAGNOSIS — F418 Other specified anxiety disorders: Secondary | ICD-10-CM

## 2018-08-10 DIAGNOSIS — I480 Paroxysmal atrial fibrillation: Secondary | ICD-10-CM | POA: Diagnosis not present

## 2018-08-10 DIAGNOSIS — I5032 Chronic diastolic (congestive) heart failure: Secondary | ICD-10-CM

## 2018-08-10 DIAGNOSIS — E039 Hypothyroidism, unspecified: Secondary | ICD-10-CM

## 2018-08-10 DIAGNOSIS — G8929 Other chronic pain: Secondary | ICD-10-CM

## 2018-08-10 MED ORDER — ALBUTEROL SULFATE HFA 108 (90 BASE) MCG/ACT IN AERS: INHALATION_SPRAY | RESPIRATORY_TRACT | 11 refills | Status: DC

## 2018-08-10 MED ORDER — MECLIZINE HCL 25 MG PO TABS: ORAL_TABLET | ORAL | 1 refills | Status: DC

## 2018-08-10 MED ORDER — NITROGLYCERIN 0.4 MG SL SUBL: SUBLINGUAL_TABLET | SUBLINGUAL | 1 refills | Status: DC

## 2018-08-10 MED ORDER — ALPRAZOLAM 0.5 MG PO TABS: 1.0000 mg | ORAL_TABLET | Freq: Every day | ORAL | 5 refills | Status: DC

## 2018-08-10 NOTE — Progress Notes (Signed)
Subjective:    Patient ID: [name], female    DOB: 1936-01-03, 82 y.o.   MRN: [id number]  HPI  Pt here for follow up and management of chronic medical problems which includes hypertension and hyperlipidemia. She is taking medication regularly.  The patient is doing well today other than complaints with a left wrist cyst.  She is requesting refills on meclizine nitroglycerin and Xanax.  She also needs a prescription for [employer].  She has an FOBT at home that she will return.  She will get lab work today.  Her vital signs are stable and her weight is down a couple pounds.  Her BMI is 28.92.  [name]'s biggest problems have been ongoing problems with pain in her joints especially in her feet.  She has trouble walking.  He has had right total knee arthroplasty bilateral rotator cuff repair, ankle fusion along with coronary angioplasty and stent replacement.  She is still being followed by neurosurgery.  She had a fall in May and had a CT scan that was negative.  The patient has a myelogram scheduled for September 17 by neurosurgery and [facility] to further evaluate her back pain.  She continues to see Dr. mother she had for her foot deformity issues and the pain that she is having with that but is somewhat reluctant to undergo a fourth surgery but is thinking about it.  She does have some occasional sharp quick chest pain with shortness of breath and then it goes away as quickly as it has come and she has had this for years and while she has had this she has seen the cardiologist.  Currently she is not having any chest pain or shortness of breath.  She says taking the probiotic has helped her with her bowel movements and reducing constipation.  She has not seen any blood in the stool or had any black tarry bowel movements.  She is passing her water well but has been told by the urologist that she may have what sounds like a rectocele that is playing a role with her constipation.  She was unable to get a  motorized wheelchair initially but is still working on this and hoping that some findings will be arranged by the people that are selling her this to help lower the cause.  She has asked them if she can spread the payments out over 12 months instead of 6 months and she will also do that.  He feels very limited with where she can go and what she can do because of her inability to walk.  She is in a wheelchair today.    Patient Active Problem List   Diagnosis Date Noted  . Snoring 02/19/2018  . Other fatigue 02/19/2018  . Chronic pain syndrome 01/20/2018  . Chronic low back pain 01/20/2018  . Chronic pain 07/02/2017  . Vocal cord dysfunction 02/04/2017  . Irritable bowel syndrome with diarrhea 12/11/2015  . UTI (urinary tract infection) 08/22/2015  . Cough 07/26/2015  . Diarrhea 11/30/2014  . Heme + stool 11/30/2014  . Chronic anticoagulation 11/30/2014  . CHF (congestive heart failure) ([place]) 04/30/2014  . 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 Osteoporosis 01/27/2014  . Fibromyalgia 08/09/2013  . Asthma 08/01/2010  . Hypothyroidism 03/20/2010  . Depression 03/20/2010  . HTN (hypertension) 03/20/2010  . ESOPHAGEAL MOTILITY DISORDER 03/20/2010  . Hyperlipidemia 04/22/2009  . Coronary atherosclerosis 04/22/2009  . ATRIAL FIBRILLATION, PAROXYSMAL 04/22/2009  . ESOPHAGEAL STRICTURE 10/31/2004  . GERD 10/31/2004  .  HIATAL HERNIA 10/06/2001   Outpatient Encounter Medications as of 08/10/2018  Medication Sig  . acetaminophen (TYLENOL) 650 MG CR tablet Take 650-1,300 mg by mouth every 8 (eight) hours as needed for pain. Depends on pain level if takes 1-2 tablets  . ALPRAZolam (XANAX) 0.5 MG tablet Take 2 tablets (1 mg total) by mouth at bedtime.  . Ascorbic Acid (VITAMIN C) 1000 MG tablet Take 1,000 mg by mouth daily.  . beta carotene 25000 UNIT capsule Take 25,000 Units by mouth daily.  . budesonide (PULMICORT) 0.5 MG/2ML nebulizer solution Take 2 mLs (0.5 mg total) by  nebulization 2 (two) times daily. Dx 496 (Patient taking differently: Take 0.5 mg by nebulization 2 (two) times daily as needed (shortness of breath). Dx 496)  . Calcium Carbonate-Vitamin D (CALCIUM 600+D) 600-400 MG-UNIT per tablet Take 1 tablet by mouth daily at 6 PM.   . cholecalciferol (VITAMIN D) 1000 UNITS tablet Take 1,000 Units by mouth 2 (two) times daily with breakfast and lunch.   . Coenzyme Q10 (COQ10) 100 MG CAPS Take 100 mg by mouth daily.  Marland Kitchen diltiazem (CARDIZEM CD) 120 MG 24 hr capsule TAKE (1) CAPSULE DAILY  . fluticasone (FLONASE) 50 MCG/ACT nasal spray 2 SPRAYS IN EACH NOSTRIL ONCE A DAY (Patient taking differently: 2 SPRAYS IN EACH NOSTRIL ONCE A DAY AS NEEDED FOR ALLERGIES)  . HYDROcodone-acetaminophen (NORCO) 5-325 MG tablet Take 1 tablet by mouth every 4 (four) hours as needed.  Marland Kitchen levothyroxine (SYNTHROID, LEVOTHROID) 25 MCG tablet TAKE 2 TABLETS DAILY AS DIRECTED  . Liniments (ANALGESIC EX) Apply 1 application topically 3 (three) times daily as needed (pain).  Marland Kitchen loratadine (CLARITIN) 10 MG tablet Take 1 tablet (10 mg total) by mouth daily.  . Melatonin 5 MG TABS Take 10 mg by mouth every evening.  . metoprolol tartrate (LOPRESSOR) 25 MG tablet Take 2 tablets each morning and 1 tablet each evening  . montelukast (SINGULAIR) 10 MG tablet Take 1 tablet (10 mg total) by mouth daily.  Marland Kitchen omega-3 acid ethyl esters (LOVAZA) 1 g capsule TAKE (2) CAPSULES TWICE DAILY.  Marland Kitchen omeprazole-sodium bicarbonate (ZEGERID) 40-1100 MG capsule Take 1 capsule by mouth daily.  Marland Kitchen PROAIR HFA 108 (90 Base) MCG/ACT inhaler USE 2 PUFFS EVERY 6 HOURS AS NEEDED FOR WHEEZING  . PROLIA 60 MG/ML SOSY injection Inject 60 mg every 6 (six) months into the skin. Administer in upper arm, thigh, or abdomen  . rosuvastatin (CRESTOR) 20 MG tablet Take 1 tablet (20 mg total) by mouth daily.  . sertraline (ZOLOFT) 100 MG tablet Take 1 tablet (100 mg total) by mouth daily.  . tamsulosin (FLOMAX) 0.4 MG CAPS capsule Take 1  capsule (0.4 mg total) by mouth daily after breakfast.  . XARELTO 20 MG TABS tablet Take 1 tablet (20 mg total) by mouth daily.  Marland Kitchen dicyclomine (BENTYL) 10 MG capsule TAKE (1) OR (2) CAPSULES EVERY SIX HOURS AS NEEDED FOR SPASM (Patient not taking: Reported on 08/10/2018)  . meclizine (ANTIVERT) 25 MG tablet TAKE (1) TABLET THREE TIMES DAILY AS NEEDED FOR DIZZINESS. (Patient not taking: Reported on 08/10/2018)  . nitroGLYCERIN (NITROSTAT) 0.4 MG SL tablet 1 tablet under the tongue every 5 minutes as needed for chest pain. (Patient not taking: Reported on 08/10/2018)  . ondansetron (ZOFRAN) 4 MG tablet TAKE 1 TABLET EVERY 12 HOURS AS NEEDED FOR NAUSEA (Patient not taking: Reported on 08/10/2018)  . [DISCONTINUED] benzonatate (TESSALON) 100 MG capsule TAKE 1 CAPSULE TWICE DAILY AS NEEDED FOR COUGH  No facility-administered encounter medications on file as of 08/10/2018.      Review of Systems  Constitutional: Negative.   HENT: Negative.   Eyes: Negative.   Respiratory: Negative.   Cardiovascular: Negative.   Gastrointestinal: Negative.   Endocrine: Negative.   Genitourinary: Negative.   Musculoskeletal: Positive for arthralgias (left wrist cyst).  Skin: Negative.   Allergic/Immunologic: Negative.   Neurological: Negative.   Hematological: Negative.   Psychiatric/Behavioral: Negative.        Objective:   Physical Exam  Constitutional: She is oriented to person, place, and time. She appears well-developed and well-nourished. No distress.  Patient is pleasant and alert  HENT:  Head: Normocephalic and atraumatic.  Left Ear: External ear normal.  Nose: Nose normal.  Mouth/Throat: Oropharynx is clear and moist.  Ear canal cerumen  Eyes: Pupils are equal, round, and reactive to light. Conjunctivae and EOM are normal. Right eye exhibits no discharge. Left eye exhibits no discharge. No scleral icterus.  Neck: Normal range of motion. Neck supple. No thyromegaly present.  Bruits or thyromegaly  not present  Cardiovascular: Normal rate, regular rhythm, normal heart sounds and intact distal pulses.  No murmur heard. The heart is regular today at 60/min.  Good pedal pulses.  Pulmonary/Chest: Effort normal and breath sounds normal. She has no wheezes. She has no rales.  Clear anteriorly and posteriorly  Abdominal: Soft. Bowel sounds are normal. She exhibits no mass. There is no tenderness. There is no guarding.  No masses tenderness organ enlargement or bruit  Musculoskeletal: Normal range of motion. She exhibits tenderness and deformity. She exhibits no edema.  Range of motion is especially limited because of foot deformities with inversion of the right foot with a high arch.  Scar tissue from previous 3 surgeries.  Lymphadenopathy:    She has no cervical adenopathy.  Neurological: She is alert and oriented to person, place, and time. She has normal reflexes.  Skin: Skin is warm and dry. No rash noted. No erythema.  Psychiatric: She has a normal mood and affect. Her behavior is normal. Judgment and thought content normal.  Mood affect and behavior are normal for this patient.  She is somewhat concerned because her son does not call her very much.  She is also somewhat frustrated that the application for the motorized wheelchair is going to cause her so much.  She says that they are working on this to help lower the cost I told her to keep pursuing the cost improvement with the people supplying the wheelchair.  Nursing note and vitals reviewed.   BP 121/64 (BP Location: Left Arm)   Pulse (!) 59   Temp (!) 97.3 F (36.3 C) (Oral)   Ht '5\' 1"'  (1.549 m)   Wt 151 lb (68.5 kg)   BMI 28.53 kg/m   Ear lavage to remove cerumen from right ear canal.     Assessment & Plan:  1. Pure hypercholesterolemia -Continue current treatment pending results of lab work - CBC with Differential/Platelet - Lipid panel  2. Essential hypertension -Blood pressure is good she will continue with current  treatment - CBC with Differential/Platelet - BMP8+EGFR - Hepatic function panel  3. Vitamin D deficiency -Continue with vitamin D replacement pending results of lab work - CBC with Differential/Platelet - VITAMIN D 25 Hydroxy (Vit-D Deficiency, Fractures)  4. Gait instability -The patient continues to have gait instability at home and still needs a motorized wheelchair.  She has fallen a couple of times.  She is unable  to get out of the house. - CBC with Differential/Platelet  5. Paroxysmal atrial fibrillation ([place]) -Follow-up yearly with cardiology.  Patient in normal sinus rhythm today at 60/min - CBC with Differential/Platelet  6. Gastroesophageal reflux disease, esophagitis presence not specified -No complaints with reflux.  Bowels are moving better taking probiotic and she understands that she needs to drink more water. - CBC with Differential/Platelet  7. Hypothyroidism, unspecified type - CBC with Differential/Platelet - Thyroid Panel With TSH  8. Spinal cord cysts -Follow-up for myelogram of back and follow through with visit to neurosurgery  9. Age related osteoporosis, unspecified pathological fracture presence -Continue with calcium and vitamin D  10. Chronic diastolic heart failure (HCC) -Continue to follow with cardiology  11. Chronic midline low back pain with sciatica, sciatica laterality unspecified -Follow-up with neurosurgery No orders of the defined types were placed in this encounter.  12.  Anxiety about health   Meds ordered this encounter  Medications  . meclizine (ANTIVERT) 25 MG tablet    Sig: TAKE (1) TABLET THREE TIMES DAILY AS NEEDED FOR DIZZINESS.    Dispense:  30 tablet    Refill:  1  . nitroGLYCERIN (NITROSTAT) 0.4 MG SL tablet    Sig: 1 tablet under the tongue every 5 minutes as needed for chest pain.    Dispense:  25 tablet    Refill:  1  . albuterol (PROAIR HFA) 108 (90 Base) MCG/ACT inhaler    Sig: USE 2 PUFFS EVERY 6 HOURS AS  NEEDED FOR WHEEZING    Dispense:  8.5 g    Refill:  11  . ALPRAZolam (XANAX) 0.5 MG tablet    Sig: Take 2 tablets (1 mg total) by mouth at bedtime.    Dispense:  60 tablet    Refill:  5   Patient Instructions                       Medicare Annual Wellness Visit  [facility] and the medical providers at [facility] strive to bring you the best medical care.  In doing so we not only want to address your current medical conditions and concerns but also to detect new conditions early and prevent illness, disease and health-related problems.    Medicare offers a yearly Wellness Visit which allows our clinical staff to assess your need for preventative services including immunizations, lifestyle education, counseling to decrease risk of preventable diseases and screening for fall risk and other medical concerns.    This visit is provided free of charge (no copay) for all Medicare recipients. The clinical pharmacists at [facility] have begun to conduct these Wellness Visits which will also include a thorough review of all your medications.    As you primary medical provider recommend that you make an appointment for your Annual Wellness Visit if you have not done so already this year.  You may set up this appointment before you leave today or you may call back [phone]) and schedule an appointment.  Please make sure when you call that you mention that you are scheduling your Annual Wellness Visit with the clinical pharmacist so that the appointment may be made for the proper length of time.     Continue current medications. Continue good therapeutic lifestyle changes which include good diet and exercise. Fall precautions discussed with patient. If an FOBT was given today- please return it to our front desk. If you are over 31 years old -  you may need Prevnar 13 or the adult Pneumonia vaccine.  **Flu shots are available--- please call and schedule a  FLU-CLINIC appointment**  After your visit with [place] today you will receive a survey in the mail or online from [employer] regarding your care with [place]. Please take a moment to fill this out. Your feedback is very important to [place] as you can help [place] better understand your patient needs as well as improve your experience and satisfaction. WE CARE ABOUT YOU!!!   The patient should continue to be careful and not put herself at risk for falling She should move slowly and not make quick turning movements She should continue to pursue getting a motorized wheelchair She should follow-up with orthopedic surgeon regarding the foot deformity She should follow-up with the neurosurgeon because of the ongoing chronic back pain. Importantly, she should drink plenty of water and fluids and stay well-hydrated.  Arrie Senate MD

## 2018-08-10 NOTE — Patient Instructions (Addendum)
Medicare Annual Wellness Visit  [facility] and the medical providers at [facility] strive to bring you the best medical care.  In doing so we not only want to address your current medical conditions and concerns but also to detect new conditions early and prevent illness, disease and health-related problems.    Medicare offers a yearly Wellness Visit which allows our clinical staff to assess your need for preventative services including immunizations, lifestyle education, counseling to decrease risk of preventable diseases and screening for fall risk and other medical concerns.    This visit is provided free of charge (no copay) for all Medicare recipients. The clinical pharmacists at [facility] have begun to conduct these Wellness Visits which will also include a thorough review of all your medications.    As you primary medical provider recommend that you make an appointment for your Annual Wellness Visit if you have not done so already this year.  You may set up this appointment before you leave today or you may call back [phone]) and schedule an appointment.  Please make sure when you call that you mention that you are scheduling your Annual Wellness Visit with the clinical pharmacist so that the appointment may be made for the proper length of time.     Continue current medications. Continue good therapeutic lifestyle changes which include good diet and exercise. Fall precautions discussed with patient. If an FOBT was given today- please return it to our front desk. If you are over 86 years old - you may need Prevnar 38 or the adult Pneumonia vaccine.  **Flu shots are available--- please call and schedule a FLU-CLINIC appointment**  After your visit with [place] today you will receive a survey in the mail or online from [employer] regarding your care with [place]. Please take a moment to fill this out. Your feedback is very  important to [place] as you can help [place] better understand your patient needs as well as improve your experience and satisfaction. WE CARE ABOUT YOU!!!   The patient should continue to be careful and not put herself at risk for falling She should move slowly and not make quick turning movements She should continue to pursue getting a motorized wheelchair She should follow-up with orthopedic surgeon regarding the foot deformity She should follow-up with the neurosurgeon because of the ongoing chronic back pain. Importantly, she should drink plenty of water and fluids and stay well-hydrated.

## 2018-08-11 LAB — CBC WITH DIFFERENTIAL/PLATELET
Basophils Absolute: 0 10*3/uL (ref 0.0–0.2)
Basos: 0 %
EOS (ABSOLUTE): 0.2 10*3/uL (ref 0.0–0.4)
Eos: 2 %
Hematocrit: 38.4 % (ref 34.0–46.6)
Hemoglobin: 12.7 g/dL (ref 11.1–15.9)
Immature Grans (Abs): 0 10*3/uL (ref 0.0–0.1)
Immature Granulocytes: 0 %
Lymphocytes Absolute: 4.2 10*3/uL — ABNORMAL HIGH (ref 0.7–3.1)
Lymphs: 49 %
MCH: 31 pg (ref 26.6–33.0)
MCHC: 33.1 g/dL (ref 31.5–35.7)
MCV: 94 fL (ref 79–97)
Monocytes Absolute: 0.5 10*3/uL (ref 0.1–0.9)
Monocytes: 5 %
Neutrophils Absolute: 3.8 10*3/uL (ref 1.4–7.0)
Neutrophils: 44 %
Platelets: 250 10*3/uL (ref 150–450)
RBC: 4.1 x10E6/uL (ref 3.77–5.28)
RDW: 13.2 % (ref 12.3–15.4)
WBC: 8.6 10*3/uL (ref 3.4–10.8)

## 2018-08-11 LAB — BMP8+EGFR
BUN/Creatinine Ratio: 15 (ref 12–28)
BUN: 12 mg/dL (ref 8–27)
CO2: 25 mmol/L (ref 20–29)
Calcium: 9.6 mg/dL (ref 8.7–10.3)
Chloride: 97 mmol/L (ref 96–106)
Creatinine, Ser: 0.8 mg/dL (ref 0.57–1.00)
GFR calc Af Amer: 81 mL/min/{1.73_m2} (ref >59)
GFR calc non Af Amer: 70 mL/min/{1.73_m2} (ref >59)
Glucose: 82 mg/dL (ref 65–99)
Potassium: 5.4 mmol/L — ABNORMAL HIGH (ref 3.5–5.2)
Sodium: 136 mmol/L (ref 134–144)

## 2018-08-11 LAB — HEPATIC FUNCTION PANEL
ALT: 23 IU/L (ref 0–32)
AST: 25 IU/L (ref 0–40)
Albumin: 4.6 g/dL (ref 3.5–4.7)
Alkaline Phosphatase: 84 IU/L (ref 39–117)
Bilirubin Total: 0.3 mg/dL (ref 0.0–1.2)
Bilirubin, Direct: 0.11 mg/dL (ref 0.00–0.40)
Total Protein: 8.1 g/dL (ref 6.0–8.5)

## 2018-08-11 LAB — LIPID PANEL
Chol/HDL Ratio: 2.2 ratio (ref 0.0–4.4)
Cholesterol, Total: 121 mg/dL (ref 100–199)
HDL: 56 mg/dL (ref >39)
LDL Calculated: 49 mg/dL (ref 0–99)
Triglycerides: 82 mg/dL (ref 0–149)
VLDL Cholesterol Cal: 16 mg/dL (ref 5–40)

## 2018-08-11 LAB — VITAMIN D 25 HYDROXY (VIT D DEFICIENCY, FRACTURES): Vit D, 25-Hydroxy: 55.6 ng/mL (ref 30.0–100.0)

## 2018-08-11 LAB — THYROID PANEL WITH TSH
Free Thyroxine Index: 1.6 (ref 1.2–4.9)
T3 Uptake Ratio: 24 % (ref 24–39)
T4, Total: 6.6 ug/dL (ref 4.5–12.0)
TSH: 1.88 u[IU]/mL (ref 0.450–4.500)

## 2018-08-12 ENCOUNTER — Telehealth: Payer: Self-pay | Admitting: Family Medicine

## 2018-08-13 DIAGNOSIS — M12571 Traumatic arthropathy, right ankle and foot: Secondary | ICD-10-CM | POA: Diagnosis not present

## 2018-08-13 DIAGNOSIS — M21171 Varus deformity, not elsewhere classified, right ankle: Secondary | ICD-10-CM | POA: Diagnosis not present

## 2018-08-13 DIAGNOSIS — R269 Unspecified abnormalities of gait and mobility: Secondary | ICD-10-CM | POA: Diagnosis not present

## 2018-08-18 NOTE — Telephone Encounter (Signed)
Mailed

## 2018-08-20 ENCOUNTER — Other Ambulatory Visit: Payer: Self-pay

## 2018-08-20 DIAGNOSIS — E875 Hyperkalemia: Secondary | ICD-10-CM

## 2018-08-24 DIAGNOSIS — G894 Chronic pain syndrome: Secondary | ICD-10-CM | POA: Diagnosis not present

## 2018-08-24 DIAGNOSIS — M5136 Other intervertebral disc degeneration, lumbar region: Secondary | ICD-10-CM | POA: Diagnosis not present

## 2018-09-01 DIAGNOSIS — M9973 Connective tissue and disc stenosis of intervertebral foramina of lumbar region: Secondary | ICD-10-CM | POA: Diagnosis not present

## 2018-09-01 DIAGNOSIS — M546 Pain in thoracic spine: Secondary | ICD-10-CM | POA: Diagnosis not present

## 2018-09-01 DIAGNOSIS — M545 Low back pain: Secondary | ICD-10-CM | POA: Diagnosis not present

## 2018-09-01 DIAGNOSIS — Z9181 History of falling: Secondary | ICD-10-CM | POA: Diagnosis not present

## 2018-09-01 DIAGNOSIS — Z9889 Other specified postprocedural states: Secondary | ICD-10-CM | POA: Diagnosis not present

## 2018-09-01 DIAGNOSIS — M9974 Connective tissue and disc stenosis of intervertebral foramina of sacral region: Secondary | ICD-10-CM | POA: Diagnosis not present

## 2018-09-01 DIAGNOSIS — R2689 Other abnormalities of gait and mobility: Secondary | ICD-10-CM | POA: Diagnosis not present

## 2018-09-01 DIAGNOSIS — M47816 Spondylosis without myelopathy or radiculopathy, lumbar region: Secondary | ICD-10-CM | POA: Diagnosis not present

## 2018-09-01 DIAGNOSIS — G9619 Other disorders of meninges, not elsewhere classified: Secondary | ICD-10-CM | POA: Diagnosis not present

## 2018-09-01 DIAGNOSIS — R292 Abnormal reflex: Secondary | ICD-10-CM | POA: Diagnosis not present

## 2018-09-14 DIAGNOSIS — G894 Chronic pain syndrome: Secondary | ICD-10-CM | POA: Diagnosis not present

## 2018-09-14 DIAGNOSIS — I509 Heart failure, unspecified: Secondary | ICD-10-CM | POA: Diagnosis not present

## 2018-09-14 DIAGNOSIS — I10 Essential (primary) hypertension: Secondary | ICD-10-CM | POA: Diagnosis not present

## 2018-09-16 ENCOUNTER — Other Ambulatory Visit: Payer: Self-pay | Admitting: Family Medicine

## 2018-09-16 ENCOUNTER — Other Ambulatory Visit: Payer: Self-pay | Admitting: Cardiology

## 2018-10-01 ENCOUNTER — Ambulatory Visit: Payer: Medicare Other

## 2018-10-02 ENCOUNTER — Other Ambulatory Visit: Payer: Self-pay | Admitting: Family Medicine

## 2018-10-06 ENCOUNTER — Ambulatory Visit (INDEPENDENT_AMBULATORY_CARE_PROVIDER_SITE_OTHER): Payer: Medicare Other

## 2018-10-06 DIAGNOSIS — E875 Hyperkalemia: Secondary | ICD-10-CM

## 2018-10-06 DIAGNOSIS — Z23 Encounter for immunization: Secondary | ICD-10-CM

## 2018-10-07 LAB — BMP8+EGFR
BUN/Creatinine Ratio: 18 (ref 12–28)
BUN: 14 mg/dL (ref 8–27)
CO2: 19 mmol/L — ABNORMAL LOW (ref 20–29)
Calcium: 9.8 mg/dL (ref 8.7–10.3)
Chloride: 98 mmol/L (ref 96–106)
Creatinine, Ser: 0.8 mg/dL (ref 0.57–1.00)
GFR calc Af Amer: 81 mL/min/{1.73_m2} (ref >59)
GFR calc non Af Amer: 70 mL/min/{1.73_m2} (ref >59)
Glucose: 108 mg/dL — ABNORMAL HIGH (ref 65–99)
Potassium: 4.7 mmol/L (ref 3.5–5.2)
Sodium: 137 mmol/L (ref 134–144)

## 2018-10-14 DIAGNOSIS — I509 Heart failure, unspecified: Secondary | ICD-10-CM | POA: Diagnosis not present

## 2018-10-14 DIAGNOSIS — G894 Chronic pain syndrome: Secondary | ICD-10-CM | POA: Diagnosis not present

## 2018-10-14 DIAGNOSIS — I10 Essential (primary) hypertension: Secondary | ICD-10-CM | POA: Diagnosis not present

## 2018-11-06 ENCOUNTER — Other Ambulatory Visit: Payer: Self-pay | Admitting: Family Medicine

## 2018-11-06 ENCOUNTER — Other Ambulatory Visit: Payer: Self-pay | Admitting: Physician Assistant

## 2018-11-14 DIAGNOSIS — G894 Chronic pain syndrome: Secondary | ICD-10-CM | POA: Diagnosis not present

## 2018-11-14 DIAGNOSIS — I509 Heart failure, unspecified: Secondary | ICD-10-CM | POA: Diagnosis not present

## 2018-11-14 DIAGNOSIS — I10 Essential (primary) hypertension: Secondary | ICD-10-CM | POA: Diagnosis not present

## 2018-12-01 DIAGNOSIS — G039 Meningitis, unspecified: Secondary | ICD-10-CM | POA: Diagnosis not present

## 2018-12-01 DIAGNOSIS — M4802 Spinal stenosis, cervical region: Secondary | ICD-10-CM | POA: Diagnosis not present

## 2018-12-01 DIAGNOSIS — G9619 Other disorders of meninges, not elsewhere classified: Secondary | ICD-10-CM | POA: Diagnosis not present

## 2018-12-01 DIAGNOSIS — G8929 Other chronic pain: Secondary | ICD-10-CM | POA: Diagnosis not present

## 2018-12-01 DIAGNOSIS — M4714 Other spondylosis with myelopathy, thoracic region: Secondary | ICD-10-CM | POA: Diagnosis not present

## 2018-12-01 DIAGNOSIS — M899 Disorder of bone, unspecified: Secondary | ICD-10-CM | POA: Diagnosis not present

## 2018-12-01 DIAGNOSIS — G9589 Other specified diseases of spinal cord: Secondary | ICD-10-CM | POA: Diagnosis not present

## 2018-12-01 DIAGNOSIS — Z9889 Other specified postprocedural states: Secondary | ICD-10-CM | POA: Diagnosis not present

## 2018-12-08 ENCOUNTER — Other Ambulatory Visit: Payer: Self-pay | Admitting: Family Medicine

## 2018-12-14 ENCOUNTER — Encounter: Payer: Self-pay | Admitting: Family Medicine

## 2018-12-14 ENCOUNTER — Ambulatory Visit (INDEPENDENT_AMBULATORY_CARE_PROVIDER_SITE_OTHER): Payer: Medicare Other | Admitting: Family Medicine

## 2018-12-14 VITALS — BP 130/60 | HR 64 | Temp 97.3°F | Ht 61.0 in | Wt 151.0 lb

## 2018-12-14 DIAGNOSIS — I480 Paroxysmal atrial fibrillation: Secondary | ICD-10-CM | POA: Diagnosis not present

## 2018-12-14 DIAGNOSIS — G894 Chronic pain syndrome: Secondary | ICD-10-CM | POA: Diagnosis not present

## 2018-12-14 DIAGNOSIS — I509 Heart failure, unspecified: Secondary | ICD-10-CM | POA: Diagnosis not present

## 2018-12-14 DIAGNOSIS — I10 Essential (primary) hypertension: Secondary | ICD-10-CM | POA: Diagnosis not present

## 2018-12-14 DIAGNOSIS — E559 Vitamin D deficiency, unspecified: Secondary | ICD-10-CM

## 2018-12-14 DIAGNOSIS — L570 Actinic keratosis: Secondary | ICD-10-CM

## 2018-12-14 DIAGNOSIS — Z0000 Encounter for general adult medical examination without abnormal findings: Secondary | ICD-10-CM

## 2018-12-14 DIAGNOSIS — E7800 Pure hypercholesterolemia, unspecified: Secondary | ICD-10-CM

## 2018-12-14 DIAGNOSIS — E039 Hypothyroidism, unspecified: Secondary | ICD-10-CM

## 2018-12-14 DIAGNOSIS — I5032 Chronic diastolic (congestive) heart failure: Secondary | ICD-10-CM

## 2018-12-14 DIAGNOSIS — R32 Unspecified urinary incontinence: Secondary | ICD-10-CM

## 2018-12-14 DIAGNOSIS — K219 Gastro-esophageal reflux disease without esophagitis: Secondary | ICD-10-CM

## 2018-12-14 DIAGNOSIS — R2681 Unsteadiness on feet: Secondary | ICD-10-CM

## 2018-12-14 LAB — MICROSCOPIC EXAMINATION

## 2018-12-14 LAB — URINALYSIS, COMPLETE
Bilirubin, UA: NEGATIVE
Glucose, UA: NEGATIVE
Ketones, UA: NEGATIVE
Nitrite, UA: NEGATIVE
Protein, UA: NEGATIVE
RBC, UA: NEGATIVE
Specific Gravity, UA: 1.015 (ref 1.005–1.030)
Urobilinogen, Ur: 0.2 mg/dL (ref 0.2–1.0)
pH, UA: 7 (ref 5.0–7.5)

## 2018-12-14 NOTE — Patient Instructions (Addendum)
Medicare Annual Wellness Visit  [facility] and the medical providers at [facility] strive to bring you the best medical care.  In doing so we not only want to address your current medical conditions and concerns but also to detect new conditions early and prevent illness, disease and health-related problems.    Medicare offers a yearly Wellness Visit which allows our clinical staff to assess your need for preventative services including immunizations, lifestyle education, counseling to decrease risk of preventable diseases and screening for fall risk and other medical concerns.    This visit is provided free of charge (no copay) for all Medicare recipients. The clinical pharmacists at [facility] have begun to conduct these Wellness Visits which will also include a thorough review of all your medications.    As you primary medical provider recommend that you make an appointment for your Annual Wellness Visit if you have not done so already this year.  You may set up this appointment before you leave today or you may call back [phone]) and schedule an appointment.  Please make sure when you call that you mention that you are scheduling your Annual Wellness Visit with the clinical pharmacist so that the appointment may be made for the proper length of time.     Continue current medications. Continue good therapeutic lifestyle changes which include good diet and exercise. Fall precautions discussed with patient. If an FOBT was given today- please return it to our front desk. If you are over 80 years old - you may need Prevnar 44 or the adult Pneumonia vaccine.  **Flu shots are available--- please call and schedule a FLU-CLINIC appointment**  After your visit with [place] today you will receive a survey in the mail or online from [employer] regarding your care with [place]. Please take a moment to fill this out. Your feedback is very  important to [place] as you can help [place] better understand your patient needs as well as improve your experience and satisfaction. WE CARE ABOUT YOU!!!   Follow-up with cardiology as planned Call the neurosurgeon office and have them to forward the last note to Dr. Rolena Infante so he will be aware of why you are coming back to see him. Continue to drink plenty of fluids and stay well-hydrated We will call with urinalysis result as well as with results of blood work as soon as those results become available Mucinex maximum strength, blue and white in color, 1 twice daily for cough and congestion with a large glass of water

## 2018-12-14 NOTE — Progress Notes (Signed)
Subjective:    Patient ID: [name], female    DOB: 12/06/1936, 82 y.o.   MRN: [id number]  HPI Pt here for follow up and management of chronic medical problems which includes hypothyroid and hyperlipidemia. She is taking medication regularly.  Patient is doing well overall but complains of a knot on her left leg and a knot or cyst on her left wrist in addition to sinus and cough issues.  She will be given an FOBT to return and will get lab work today.  Her vital signs are stable and her BMI is 28.55.  She is followed regularly by the cardiologist and also by an orthopedist.  Her last colonoscopy was in 2016.  The patient uses inhalers and takes several cardiac meds from the cardiologist allergy medicines Crestor and is on Prolia injections.  She is on thyroid replacement.  The patient is pleasant and smiling.  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  She will have the neurosurgeons office call the orthopedist office so he will be aware of why she is coming.  Today she denies any chest pain pressure tightness or shortness of breath anymore than usual.  She does have an upcoming appointment with a cardiologist for her regular checkup soon.  She does have occasional heartburn and takes omeprazole for this but has not had this any more often than in the past.  She is passing her bowels well without blood in the stool and no abdominal pain.  She is doing well with voiding but the past couple days has had more urgency so we will check a urinalysis on her.  Several skin lesions were looked at and the one on her leg and on her right side appear to be benign but she does have an actinic keratosis on her chest wall between her breasts.   Patient Active Problem List   Diagnosis Date Noted  . Snoring 02/19/2018  . Other fatigue 02/19/2018  . Chronic pain syndrome 01/20/2018  .  Chronic low back pain 01/20/2018  . Chronic pain 07/02/2017  . Vocal cord dysfunction 02/04/2017  . Irritable bowel syndrome with diarrhea 12/11/2015  . UTI (urinary tract infection) 08/22/2015  . Cough 07/26/2015  . Diarrhea 11/30/2014  . Heme + stool 11/30/2014  . Chronic anticoagulation 11/30/2014  . CHF (congestive heart failure) ([facility]) 04/30/2014  . 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 Osteoporosis 01/27/2014  . Fibromyalgia 08/09/2013  . Asthma 08/01/2010  . Hypothyroidism 03/20/2010  . Depression 03/20/2010  . HTN (hypertension) 03/20/2010  . ESOPHAGEAL MOTILITY DISORDER 03/20/2010  . Hyperlipidemia 04/22/2009  . Coronary atherosclerosis 04/22/2009  . ATRIAL FIBRILLATION, PAROXYSMAL 04/22/2009  . ESOPHAGEAL STRICTURE 10/31/2004  . GERD 10/31/2004  . HIATAL HERNIA 10/06/2001   Outpatient Encounter Medications as of 12/14/2018  Medication Sig  . acetaminophen (TYLENOL) 650 MG CR tablet Take 650-1,300 mg by mouth every 8 (eight) hours as needed for pain. Depends on pain level if takes 1-2 tablets  . albuterol (PROAIR HFA) 108 (90 Base) MCG/ACT inhaler USE 2 PUFFS EVERY 6 HOURS AS NEEDED FOR WHEEZING  . ALPRAZolam (XANAX) 0.5 MG tablet Take 2 tablets (1 mg total) by mouth at bedtime.  . Ascorbic Acid (VITAMIN C) 1000 MG tablet Take 1,000 mg by mouth daily.  . beta carotene 25000 UNIT capsule Take 25,000 Units by mouth daily.  Marland Kitchen  budesonide (PULMICORT) 0.5 MG/2ML nebulizer solution Take 2 mLs (0.5 mg total) by nebulization 2 (two) times daily. Dx 496 (Patient taking differently: Take 0.5 mg by nebulization 2 (two) times daily as needed (shortness of breath). Dx 496)  . Calcium Carbonate-Vitamin D (CALCIUM 600+D) 600-400 MG-UNIT per tablet Take 1 tablet by mouth daily at 6 PM.   . cholecalciferol (VITAMIN D) 1000 UNITS tablet Take 1,000 Units by mouth 2 (two) times daily with breakfast and lunch.   . Coenzyme Q10 (COQ10) 100 MG CAPS Take 100 mg by mouth daily.  Marland Kitchen  dicyclomine (BENTYL) 10 MG capsule TAKE (1) OR (2) CAPSULES EVERY SIX HOURS AS NEEDED FOR SPASM (Patient not taking: Reported on 08/10/2018)  . diltiazem (CARDIZEM CD) 120 MG 24 hr capsule TAKE (1) CAPSULE DAILY  . fluticasone (FLONASE) 50 MCG/ACT nasal spray 2 SPRAYS IN EACH NOSTRIL ONCE A DAY  . HYDROcodone-acetaminophen (NORCO) 5-325 MG tablet Take 1 tablet by mouth every 4 (four) hours as needed.  Marland Kitchen levothyroxine (SYNTHROID, LEVOTHROID) 25 MCG tablet TAKE 2 TABLETS DAILY AS DIRECTED  . Liniments (ANALGESIC EX) Apply 1 application topically 3 (three) times daily as needed (pain).  Marland Kitchen loratadine (CLARITIN) 10 MG tablet Take 1 tablet (10 mg total) by mouth daily.  Marland Kitchen loratadine (CLARITIN) 10 MG tablet Take 1 tablet (10 mg total) by mouth daily.  . meclizine (ANTIVERT) 25 MG tablet TAKE (1) TABLET THREE TIMES DAILY AS NEEDED FOR DIZZINESS.  . Melatonin 5 MG TABS Take 10 mg by mouth every evening.  . metoprolol tartrate (LOPRESSOR) 25 MG tablet TAKE 2 TABLETS EVERY MORNING AND 1 EVERY EVENING  . montelukast (SINGULAIR) 10 MG tablet Take 1 tablet (10 mg total) by mouth daily.  . montelukast (SINGULAIR) 10 MG tablet Take 1 tablet (10 mg total) by mouth daily.  . nitroGLYCERIN (NITROSTAT) 0.4 MG SL tablet 1 tablet under the tongue every 5 minutes as needed for chest pain.  Marland Kitchen omega-3 acid ethyl esters (LOVAZA) 1 g capsule TAKE (2) CAPSULES TWICE DAILY.  Marland Kitchen omeprazole-sodium bicarbonate (ZEGERID) 40-1100 MG capsule Take 1 capsule by mouth daily.  . ondansetron (ZOFRAN) 4 MG tablet TAKE 1 TABLET EVERY 12 HOURS AS NEEDED FOR NAUSEA  . PROLIA 60 MG/ML SOSY injection Inject 60 mg every 6 (six) months into the skin. Administer in upper arm, thigh, or abdomen  . rosuvastatin (CRESTOR) 20 MG tablet Take 1 tablet (20 mg total) by mouth daily.  . sertraline (ZOLOFT) 100 MG tablet Take 1 tablet (100 mg total) by mouth daily.  . tamsulosin (FLOMAX) 0.4 MG CAPS capsule Take 1 capsule (0.4 mg total) by mouth daily  after breakfast.  . XARELTO 20 MG TABS tablet Take 1 tablet (20 mg total) by mouth daily.   No facility-administered encounter medications on file as of 12/14/2018.       Review of Systems  Constitutional: Negative.   HENT: Positive for sinus pressure.   Eyes: Negative.   Respiratory: Positive for cough.   Cardiovascular: Negative.   Gastrointestinal: Negative.   Endocrine: Negative.   Genitourinary: Negative.   Musculoskeletal: Negative.   Skin: Negative.        Knot on left wrist and left leg   Allergic/Immunologic: Negative.   Neurological: Negative.   Hematological: Negative.   Psychiatric/Behavioral: Negative.        Objective:   Physical Exam Vitals signs and nursing note reviewed.  Constitutional:      Appearance: Normal appearance. She is well-developed. She is obese. She is  not ill-appearing.     Comments: The patient is pleasant and smiling despite the problem with pain she has in her back and feet.  HENT:     Head: Normocephalic and atraumatic.     Right Ear: Tympanic membrane, ear canal and external ear normal.     Left Ear: Tympanic membrane, ear canal and external ear normal.     Nose: Nose normal. No congestion.     Mouth/Throat:     Mouth: Mucous membranes are dry.     Pharynx: Oropharynx is clear.  Eyes:     General: No scleral icterus.       Right eye: No discharge.        Left eye: No discharge.     Extraocular Movements: Extraocular movements intact.     Conjunctiva/sclera: Conjunctivae normal.     Pupils: Pupils are equal, round, and reactive to light.  Neck:     Musculoskeletal: Normal range of motion and neck supple.     Thyroid: No thyromegaly.     Vascular: No carotid bruit or JVD.  Cardiovascular:     Rate and Rhythm: Normal rate and regular rhythm.     Heart sounds: Normal heart sounds. No murmur.     Comments: The heart is regular at 60/min Pulmonary:     Effort: Pulmonary effort is normal.     Breath sounds: Normal breath sounds.  No wheezing or rales.     Comments: No rales or rhonchi and minimal congestion with coughing Abdominal:     General: Bowel sounds are normal.     Palpations: Abdomen is soft. There is no mass.     Tenderness: There is abdominal tenderness. There is no guarding or rebound.     Comments: Slight epigastric and suprapubic tenderness without any masses or bruits  Musculoskeletal: Normal range of motion.        General: Deformity present. No tenderness.     Right lower leg: No edema.     Left lower leg: No edema.     Comments: Patient in wheelchair due to pain and discomfort with standing.  Patient examined in wheelchair.  Deformity of both ankles  Lymphadenopathy:     Cervical: No cervical adenopathy.  Skin:    General: Skin is warm and dry.     Findings: Lesion present. No rash.     Comments: Cryotherapy to an actinic keratoses between both breasts on chest wall.  Neurological:     General: No focal deficit present.     Mental Status: She is alert and oriented to person, place, and time.     Cranial Nerves: No cranial nerve deficit.     Deep Tendon Reflexes: Reflexes are normal and symmetric.  Psychiatric:        Mood and Affect: Mood normal.        Behavior: Behavior normal.        Thought Content: Thought content normal.        Judgment: Judgment normal.     Comments: Mood affect and behavior were positive for this patient and normal for her.    BP 130/60 (BP Location: Left Arm)   Pulse 64   Temp (!) 97.3 F (36.3 C) (Oral)   Ht _0  (1.549 m)   Wt 151 lb (68.5 kg)   BMI 28.53 kg/m         Assessment & Plan:  1. Essential hypertension -The blood pressure is good and stable since she has increased her metoprolol.  She will continue with the current dose. - CBC with Differential/Platelet - BMP8+EGFR - Hepatic function panel  2. Pure hypercholesterolemia -Continue with Crestor and with as aggressive therapeutic lifestyle changes as possible for her medical  condition. - CBC with Differential/Platelet - Lipid panel  3. Vitamin D deficiency -Continue with vitamin D replacement pending results of lab work - CBC with Differential/Platelet - VITAMIN D 25 Hydroxy (Vit-D Deficiency, Fractures)  4. Paroxysmal atrial fibrillation (HCC) -Patient in normal sinus rhythm today at 60/min - CBC with Differential/Platelet  5. Gastroesophageal reflux disease, esophagitis presence not specified -Slight epigastric tenderness on exam and she does have periodic heartburn and does continue to take omeprazole. - CBC with Differential/Platelet - Hepatic function panel  6. Hypothyroidism, unspecified type -Continue with thyroid replacement pending results of lab work - CBC with Differential/Platelet - Thyroid Panel With TSH  7. Healthcare maintenance -We will arrange to do a DEXA scan at some point in the future  8. Urinary incontinence, unspecified type -Patient has had more urgency with voiding the past few days and we will check a urinalysis - Urine Culture - Urinalysis, Complete  9. Actinic keratosis -Cryotherapy performed and patient tolerated procedure well to what appears to be an actinic keratosis on the chest wall between both breast  10. Gait instability -Patient will continue to use wheelchair walker and cane at home.  11. Chronic diastolic heart failure [facility]) -Follow-up with cardiology as planned  Patient Instructions                       Medicare Annual Wellness Visit  [facility] and the medical providers at [facility] strive to bring you the best medical care.  In doing so we not only want to address your current medical conditions and concerns but also to detect new conditions early and prevent illness, disease and health-related problems.    Medicare offers a yearly Wellness Visit which allows our clinical staff to assess your need for preventative services including immunizations, lifestyle education,  counseling to decrease risk of preventable diseases and screening for fall risk and other medical concerns.    This visit is provided free of charge (no copay) for all Medicare recipients. The clinical pharmacists at [facility] have begun to conduct these Wellness Visits which will also include a thorough review of all your medications.    As you primary medical provider recommend that you make an appointment for your Annual Wellness Visit if you have not done so already this year.  You may set up this appointment before you leave today or you may call back ([phone]) and schedule an appointment.  Please make sure when you call that you mention that you are scheduling your Annual Wellness Visit with the clinical pharmacist so that the appointment may be made for the proper length of time.     Continue current medications. Continue good therapeutic lifestyle changes which include good diet and exercise. Fall precautions discussed with patient. If an FOBT was given today- please return it to our front desk. If you are over 61 years old - you may need Prevnar 7 or the adult Pneumonia vaccine.  **Flu shots are available--- please call and schedule a FLU-CLINIC appointment**  After your visit with [place] today you will receive a survey in the mail or online from [employer] regarding your care with [place]. Please take a moment to fill this out. Your feedback is very important to [place]  as you can help [place] better understand your patient needs as well as improve your experience and satisfaction. WE CARE ABOUT YOU!!!   Follow-up with cardiology as planned Call the neurosurgeon office and have them to forward the last note to Dr. Rolena Infante so he will be aware of why you are coming back to see him. Continue to drink plenty of fluids and stay well-hydrated We will call with urinalysis result as well as with results of blood work as soon as those results become available Mucinex maximum strength,  blue and white in color, 1 twice daily for cough and congestion with a large glass of water    Arrie Senate MD

## 2018-12-15 LAB — CBC WITH DIFFERENTIAL/PLATELET
Basophils Absolute: 0.1 10*3/uL (ref 0.0–0.2)
Basos: 1 %
EOS (ABSOLUTE): 0.2 10*3/uL (ref 0.0–0.4)
Eos: 3 %
Hematocrit: 36.2 % (ref 34.0–46.6)
Hemoglobin: 12.9 g/dL (ref 11.1–15.9)
Immature Grans (Abs): 0 10*3/uL (ref 0.0–0.1)
Immature Granulocytes: 0 %
Lymphocytes Absolute: 4.4 10*3/uL — ABNORMAL HIGH (ref 0.7–3.1)
Lymphs: 50 %
MCH: 32.7 pg (ref 26.6–33.0)
MCHC: 35.6 g/dL (ref 31.5–35.7)
MCV: 92 fL (ref 79–97)
Monocytes Absolute: 0.6 10*3/uL (ref 0.1–0.9)
Monocytes: 7 %
Neutrophils Absolute: 3.4 10*3/uL (ref 1.4–7.0)
Neutrophils: 39 %
Platelets: 225 10*3/uL (ref 150–450)
RBC: 3.94 x10E6/uL (ref 3.77–5.28)
RDW: 12.6 % (ref 12.3–15.4)
WBC: 8.7 10*3/uL (ref 3.4–10.8)

## 2018-12-15 LAB — URINE CULTURE

## 2018-12-15 LAB — HEPATIC FUNCTION PANEL
ALT: 16 IU/L (ref 0–32)
AST: 25 IU/L (ref 0–40)
Albumin: 4.3 g/dL (ref 3.5–4.7)
Alkaline Phosphatase: 109 IU/L (ref 39–117)
Bilirubin Total: 0.4 mg/dL (ref 0.0–1.2)
Bilirubin, Direct: 0.12 mg/dL (ref 0.00–0.40)
Total Protein: 8 g/dL (ref 6.0–8.5)

## 2018-12-15 LAB — THYROID PANEL WITH TSH
Free Thyroxine Index: 1.8 (ref 1.2–4.9)
T3 Uptake Ratio: 25 % (ref 24–39)
T4, Total: 7.2 ug/dL (ref 4.5–12.0)
TSH: 1.67 u[IU]/mL (ref 0.450–4.500)

## 2018-12-15 LAB — BMP8+EGFR
BUN/Creatinine Ratio: 13 (ref 12–28)
BUN: 11 mg/dL (ref 8–27)
CO2: 25 mmol/L (ref 20–29)
Calcium: 9.8 mg/dL (ref 8.7–10.3)
Chloride: 96 mmol/L (ref 96–106)
Creatinine, Ser: 0.83 mg/dL (ref 0.57–1.00)
GFR calc Af Amer: 77 mL/min/{1.73_m2} (ref >59)
GFR calc non Af Amer: 67 mL/min/{1.73_m2} (ref >59)
Glucose: 84 mg/dL (ref 65–99)
Potassium: 4.2 mmol/L (ref 3.5–5.2)
Sodium: 134 mmol/L (ref 134–144)

## 2018-12-15 LAB — LIPID PANEL
Chol/HDL Ratio: 2.2 ratio (ref 0.0–4.4)
Cholesterol, Total: 109 mg/dL (ref 100–199)
HDL: 49 mg/dL (ref >39)
LDL Calculated: 42 mg/dL (ref 0–99)
Triglycerides: 89 mg/dL (ref 0–149)
VLDL Cholesterol Cal: 18 mg/dL (ref 5–40)

## 2018-12-15 LAB — VITAMIN D 25 HYDROXY (VIT D DEFICIENCY, FRACTURES): Vit D, 25-Hydroxy: 53.2 ng/mL (ref 30.0–100.0)

## 2019-01-01 ENCOUNTER — Other Ambulatory Visit: Payer: Self-pay | Admitting: Family Medicine

## 2019-01-01 ENCOUNTER — Telehealth: Payer: Self-pay | Admitting: Family Medicine

## 2019-01-01 MED ORDER — BENZONATATE 100 MG PO CAPS: 100.0000 mg | ORAL_CAPSULE | Freq: Two times a day (BID) | ORAL | 0 refills | Status: DC | PRN

## 2019-01-01 NOTE — Telephone Encounter (Signed)
Spoke with pt about meds -- -she has a question about prolia - it appears to be due.  KAY, can you check on this?

## 2019-01-01 NOTE — Telephone Encounter (Signed)
PT is wanting to talk to [facility] about an rx and about when she is due for her next prolia inj

## 2019-01-01 NOTE — Telephone Encounter (Signed)
Spoke with pt regarding Prolia Will submit insurance verication and contact pt with appt

## 2019-01-14 DIAGNOSIS — G894 Chronic pain syndrome: Secondary | ICD-10-CM | POA: Diagnosis not present

## 2019-01-14 DIAGNOSIS — I10 Essential (primary) hypertension: Secondary | ICD-10-CM | POA: Diagnosis not present

## 2019-01-14 DIAGNOSIS — I509 Heart failure, unspecified: Secondary | ICD-10-CM | POA: Diagnosis not present

## 2019-01-15 ENCOUNTER — Telehealth: Payer: Self-pay | Admitting: *Deleted

## 2019-01-15 MED ORDER — DENOSUMAB 60 MG/ML ~~LOC~~ SOSY: PREFILLED_SYRINGE | SUBCUTANEOUS | 0 refills | Status: DC

## 2019-01-15 NOTE — Telephone Encounter (Signed)
Reviewed SOB for Prolia with pt SOB states pt will be responsible for 20% of medication and inj fee Pt states she has never had to pay before Pt will contact insurance for verification

## 2019-01-15 NOTE — Telephone Encounter (Signed)
RX for Prolia sent to [facility] per pt request Per Sharyn Lull with [facility] the cost to pt is $8.95. Pt notified of RX and price Pt is having surgery in February to remove cyst from spinal cord Pt will check with surgeon before getting Prolia inj If no contraindication, pt will come in next week for inj

## 2019-01-18 ENCOUNTER — Telehealth: Payer: Self-pay | Admitting: Cardiology

## 2019-01-18 NOTE — Telephone Encounter (Signed)
No note needed 

## 2019-01-19 ENCOUNTER — Telehealth: Payer: Self-pay | Admitting: Cardiology

## 2019-01-19 NOTE — Telephone Encounter (Signed)
° °  [facility] Pre-operative Risk Assessment    Request for surgical clearance:  1. What type of surgery is being performed? Spinal Cord Stimulator revision  2. When is this surgery scheduled? 01/25/19  3. What type of clearance is required (medical clearance vs. Pharmacy clearance to hold med vs. Both)? Pharmacy  4. Are there any medications that need to be held prior to surgery and how long? XARELTO 20 MG TABS tablet  5. Practice name and name of physician performing surgery? [facility] / Dr. Atilano Ina  6. What is your office phone number   7.   What is your office fax number [phone]  8.   Anesthesia type (None, local, MAC, general) ? General    Ermelinda Das 01/19/2019, 3:13 PM  _________________________________________________________________   (provider comments below)

## 2019-01-20 ENCOUNTER — Telehealth: Payer: Self-pay | Admitting: Family Medicine

## 2019-01-20 NOTE — Telephone Encounter (Signed)
Pharm can you address xarelto?

## 2019-01-20 NOTE — Telephone Encounter (Signed)
Pt takes Xarelto for afib with CHADS2VASc score of 6 (age x2, sex, CHF, HTN, CAD). Renal function is normal. Recommend holding Xarelto for 3 days prior to spinal procedure per protocol.

## 2019-01-20 NOTE — Telephone Encounter (Signed)
Dr. Percival Spanish pt has not been in since 02/19/18 and now needs cyst removed from spine and adjustment of spine stimulator.  She is debilitated in wheel chair due to back and foot issues.  No chest pain or SOB.  Do you think we should bring in or are you comfortable to clear?   Surgery is 01/25/19  thanks

## 2019-01-21 NOTE — Telephone Encounter (Signed)
Spoke with pt regarding Prolia inj Pt is scheduled for spinal surgery on Monday Pt instructed to ask surgeon before getting inj Pt decided to wait until after surgery to get Prolia

## 2019-01-22 DIAGNOSIS — T85192A Other mechanical complication of implanted electronic neurostimulator (electrode) of spinal cord, initial encounter: Secondary | ICD-10-CM | POA: Diagnosis not present

## 2019-01-25 DIAGNOSIS — I509 Heart failure, unspecified: Secondary | ICD-10-CM | POA: Diagnosis not present

## 2019-01-25 DIAGNOSIS — I110 Hypertensive heart disease with heart failure: Secondary | ICD-10-CM | POA: Diagnosis not present

## 2019-01-25 DIAGNOSIS — T85890A Other specified complication of nervous system prosthetic devices, implants and grafts, initial encounter: Secondary | ICD-10-CM | POA: Diagnosis not present

## 2019-01-25 DIAGNOSIS — T85113A Breakdown (mechanical) of implanted electronic neurostimulator, generator, initial encounter: Secondary | ICD-10-CM | POA: Diagnosis not present

## 2019-01-25 DIAGNOSIS — G8929 Other chronic pain: Secondary | ICD-10-CM | POA: Diagnosis not present

## 2019-01-25 DIAGNOSIS — M546 Pain in thoracic spine: Secondary | ICD-10-CM | POA: Diagnosis not present

## 2019-01-25 DIAGNOSIS — T85192A Other mechanical complication of implanted electronic neurostimulator (electrode) of spinal cord, initial encounter: Secondary | ICD-10-CM | POA: Diagnosis not present

## 2019-01-25 DIAGNOSIS — G894 Chronic pain syndrome: Secondary | ICD-10-CM | POA: Diagnosis not present

## 2019-01-25 DIAGNOSIS — M4802 Spinal stenosis, cervical region: Secondary | ICD-10-CM | POA: Diagnosis not present

## 2019-01-25 DIAGNOSIS — M961 Postlaminectomy syndrome, not elsewhere classified: Secondary | ICD-10-CM | POA: Diagnosis not present

## 2019-01-25 DIAGNOSIS — I4891 Unspecified atrial fibrillation: Secondary | ICD-10-CM | POA: Diagnosis not present

## 2019-01-26 NOTE — Telephone Encounter (Addendum)
Called [facility] to confirm if surgery was done and if clearance was faxed over prior to procedure.Procedure was performed on 01/25/2019 and clearance it was not faxed. Faxed to the Spine center and confirmation was confirmed.

## 2019-01-26 NOTE — Telephone Encounter (Signed)
Surgery was yesterday,  please check to see if preop clearance is still needed.

## 2019-01-26 NOTE — Telephone Encounter (Signed)
Agree 

## 2019-01-28 ENCOUNTER — Other Ambulatory Visit: Payer: Self-pay | Admitting: Family Medicine

## 2019-01-28 NOTE — Telephone Encounter (Signed)
Last seen 12/14/18

## 2019-02-03 ENCOUNTER — Encounter: Payer: Self-pay | Admitting: Cardiology

## 2019-02-06 ENCOUNTER — Other Ambulatory Visit: Payer: Self-pay | Admitting: Family Medicine

## 2019-02-09 ENCOUNTER — Other Ambulatory Visit: Payer: Self-pay | Admitting: Family Medicine

## 2019-02-09 DIAGNOSIS — Z48811 Encounter for surgical aftercare following surgery on the nervous system: Secondary | ICD-10-CM | POA: Diagnosis not present

## 2019-02-09 DIAGNOSIS — Z9889 Other specified postprocedural states: Secondary | ICD-10-CM | POA: Diagnosis not present

## 2019-02-13 DIAGNOSIS — G894 Chronic pain syndrome: Secondary | ICD-10-CM | POA: Diagnosis not present

## 2019-02-13 DIAGNOSIS — I509 Heart failure, unspecified: Secondary | ICD-10-CM | POA: Diagnosis not present

## 2019-02-13 DIAGNOSIS — I10 Essential (primary) hypertension: Secondary | ICD-10-CM | POA: Diagnosis not present

## 2019-02-21 ENCOUNTER — Encounter: Payer: Self-pay | Admitting: Cardiology

## 2019-02-21 NOTE — Progress Notes (Deleted)
Cardiology Office Note   Date:  02/21/2019   ID:  Monia, Timmers Jan 18, 1936, MRN [id number]  PCP:  Chipper Herb, MD  Cardiologist:   No primary care provider on file.   No chief complaint on file.     History of Present Illness: [name] is a 82 y.o. female who presents for follow up of CAD.  She has PAF as well.   Since I last saw her she has had back surgery.  ***    is had no new complaints.  She got a back stimulator but it did not help her back.  She is very limited by back problems and in particular by a very damaged right foot from chronic arthritic changes.  She gets around very slowly with a cane.  She thinks she might of had atrial fibrillation 3 or 4 times since I saw her and may be one episode lasted for about an hour.  She took a nitroglycerin.  However, she says she cannot really tell when she is in atrial fibrillation.  She has not had any presyncope or syncope.  She denies any new chest pressure, neck or arm discomfort.  Is not had any new shortness of breath, PND or orthopnea.  Of note she does complain predominantly of fatigue.  That is her biggest issue.  She has significant snoring and daytime somnolence and I wanted to send her for a sleep study in the past but she never had this   Past Medical History:  Diagnosis Date  . Anxiety   . Arthritis   . Asthmatic bronchitis   . Atrial fibrillation ([place])   . Bowel obstruction ([facility])    blockage  . CAD (coronary artery disease)    Stent to RI 2003.  Myoview 2013 no ischemia.  . Cataract   . Chronic back pain   . Chronic bronchitis ([place])   . Colon polyp    adenomatous  . Congestive heart disease (H[facility])    Preserved EF  . Depression   . Esophageal motility disorder   . Fibromyalgia   . GERD (gastroesophageal reflux disease)   . History of hiatal hernia   . History of kidney stones   . Hyperlipidemia   . Hypertension   . Hypothyroid   . 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 Myocardial infarction ([place])    2000  . Nephrolithiasis   . OA (osteoarthritis)   . Status post dilation of esophageal narrowing     Past Surgical History:  Procedure Laterality Date  . ABDOMINAL HYSTERECTOMY     partial  . ANKLE FUSION  08/27/2012   Procedure: ARTHRODESIS ANKLE;  Surgeon: Wylene Simmer, MD;  Location: [place];  Service: Orthopedics;  Laterality: Right;  Arthrodesis right ankle and subtalar joint  . APPENDECTOMY    . ARTHRODESIS TIBIOFIBULAR Right 03/31/2014   Procedure: REVISION OF SUBTALOR ARTHRODESIS  RIGHT ;  Surgeon: Wylene Simmer, MD;  Location: [place];  Service: Orthopedics;  Laterality: Right;  . BACK SURGERY    . BREAST SURGERY     Left breast lump removed  . BRONCHOSCOPY    . CORONARY ANGIOPLASTY WITH STENT PLACEMENT  2003  . DECORTICATION Right 05/03/2014   Procedure: DECORTICATION;  Surgeon: [name], MD;  Location: [place];  Service: Thoracic;  Laterality: Right;  . EYE SURGERY  2016   cateracts  . FOOT ARTHRODESIS, SUBTALAR Right 2013  . HARDWARE REMOVAL  Right 03/31/2014   Procedure: REMOVAL OF DEEP IMPLANTS X 3  RIGHT ;  Surgeon: Wylene Simmer, MD;  Location: [place];  Service: Orthopedics;  Laterality: Right;  . HARDWARE REMOVAL Right 11/03/2014   Procedure: HARDWARE REMOVAL OF SUBTALAR JOINT;  Surgeon: Wylene Simmer, MD;  Location: [place];  Service: Orthopedics;  Laterality: Right;  . HARDWARE REVISION  03/31/2014   SUBTALOR  ARTHRODESIS       DR HEWITT  . JOINT REPLACEMENT     Right knee  . KNEE ARTHROSCOPY  03/26/2012   Procedure: ARTHROSCOPY KNEE;  Surgeon: Wylene Simmer, MD;  Location: [place];  Service: Orthopedics;  Laterality: Left;  with Debridement of Lateral Meniscus tear  . REMOVAL OF IMPLANT Right 03/31/2014   DR HEWITT  . ROTATOR CUFF REPAIR Bilateral   . SINUS SURGERY WITH INSTATRAK    . SPINAL CORD STIMULATOR INSERTION N/A 07/02/2017   Procedure: LUMBAR SPINAL CORD STIMULATOR INSERTION;  Surgeon: Melina Schools,  MD;  Location: [place];  Service: Orthopedics;  Laterality: N/A;  120 mins  . TOTAL KNEE ARTHROPLASTY Right   . VIDEO ASSISTED THORACOSCOPY (VATS)/EMPYEMA Right 05/03/2014   Procedure: VIDEO ASSISTED THORACOSCOPY (VATS)/EMPYEMA;  Surgeon: [name], MD;  Location: [place];  Service: Thoracic;  Laterality: Right;  Marland Kitchen VIDEO BRONCHOSCOPY N/A 05/03/2014   Procedure: VIDEO BRONCHOSCOPY;  Surgeon: [name], MD;  Location[facility];  Service: Thoracic;  Laterality: N/A;     Current Outpatient Medications  Medication Sig Dispense Refill  . acetaminophen (TYLENOL) 650 MG CR tablet Take 650-1,300 mg by mouth every 8 (eight) hours as needed for pain. Depends on pain level if takes 1-2 tablets    . albuterol (PROAIR HFA) 108 (90 Base) MCG/ACT inhaler USE 2 PUFFS EVERY 6 HOURS AS NEEDED FOR WHEEZING 8.5 g 11  . ALPRAZolam (XANAX) 0.5 MG tablet TAKE 2 TABLETS AT BEDTIME 60 tablet 3  . Ascorbic Acid (VITAMIN C) 1000 MG tablet Take 1,000 mg by mouth daily.    . benzonatate (TESSALON) 100 MG capsule TAKE 1 CAPSULE TWICE DAILY AS NEEDED FOR COUGH 20 capsule 0  . beta carotene 25000 UNIT capsule Take 25,000 Units by mouth daily.    . budesonide (PULMICORT) 0.5 MG/2ML nebulizer solution Take 2 mLs (0.5 mg total) by nebulization 2 (two) times daily. Dx 496 (Patient taking differently: Take 0.5 mg by nebulization 2 (two) times daily as needed (shortness of breath). Dx 496) 120 mL 6  . Calcium Carbonate-Vitamin D (CALCIUM 600+D) 600-400 MG-UNIT per tablet Take 1 tablet by mouth daily at 6 PM.     . cholecalciferol (VITAMIN D) 1000 UNITS tablet Take 1,000 Units by mouth 2 (two) times daily with breakfast and lunch.     . Coenzyme Q10 (COQ10) 100 MG CAPS Take 100 mg by mouth daily.    Marland Kitchen denosumab (PROLIA) 60 MG/ML SOSY injection Inject 60 mg every 6 (six) months into the skin. Administer in upper arm, thigh, or abdomen 1 mL 0  . dicyclomine (BENTYL) 10 MG capsule TAKE (1) OR (2) CAPSULES EVERY SIX HOURS AS  NEEDED FOR SPASM 60 capsule 2  . diltiazem (CARDIZEM CD) 120 MG 24 hr capsule TAKE (1) CAPSULE DAILY 30 capsule 10  . fluticasone (FLONASE) 50 MCG/ACT nasal spray 2 SPRAYS IN EACH NOSTRIL ONCE A DAY 16 g 5  . HYDROcodone-acetaminophen (NORCO) 5-325 MG tablet Take 1 tablet by mouth every 4 (four) hours as needed. 90 tablet 0  . levothyroxine (SYNTHROID, LEVOTHROID)  25 MCG tablet TAKE 2 TABLETS DAILY AS DIRECTED 180 tablet 2  . Liniments (ANALGESIC EX) Apply 1 application topically 3 (three) times daily as needed (pain).    Marland Kitchen loratadine (CLARITIN) 10 MG tablet Take 1 tablet (10 mg total) by mouth daily. 90 tablet 0  . loratadine (CLARITIN) 10 MG tablet Take 1 tablet (10 mg total) by mouth daily. 90 tablet 0  . meclizine (ANTIVERT) 25 MG tablet TAKE (1) TABLET THREE TIMES DAILY AS NEEDED FOR DIZZINESS. 30 tablet 1  . Melatonin 5 MG TABS Take 10 mg by mouth every evening.    . metoprolol tartrate (LOPRESSOR) 25 MG tablet TAKE 2 TABLETS EVERY MORNING AND 1 EVERY EVENING 270 tablet 1  . montelukast (SINGULAIR) 10 MG tablet Take 1 tablet (10 mg total) by mouth daily. 90 tablet 0  . montelukast (SINGULAIR) 10 MG tablet Take 1 tablet (10 mg total) by mouth daily. 90 tablet 0  . nitroGLYCERIN (NITROSTAT) 0.4 MG SL tablet 1 tablet under the tongue every 5 minutes as needed for chest pain. 25 tablet 1  . omega-3 acid ethyl esters (LOVAZA) 1 g capsule TAKE (2) CAPSULES TWICE DAILY. 360 capsule 0  . omeprazole-sodium bicarbonate (ZEGERID) 40-1100 MG capsule Take 1 capsule by mouth daily. 90 capsule 3  . ondansetron (ZOFRAN) 4 MG tablet TAKE 1 TABLET EVERY 12 HOURS AS NEEDED FOR NAUSEA 20 tablet 0  . rosuvastatin (CRESTOR) 20 MG tablet Take 1 tablet (20 mg total) by mouth daily. 90 tablet 0  . sertraline (ZOLOFT) 100 MG tablet Take 1 tablet (100 mg total) by mouth daily. 90 tablet 0  . tamsulosin (FLOMAX) 0.4 MG CAPS capsule TAKE (1) CAPSULE DAILY 30 capsule 3  . XARELTO 20 MG TABS tablet Take 1 tablet (20 mg  total) by mouth daily. 90 tablet 0   No current facility-administered medications for this visit.     Allergies:   Naproxen; Penicillins; Sulfonamide derivatives; Aspirin; Captopril; and Clindamycin/lincomycin    ROS:  Please see the history of present illness.   Otherwise, review of systems are positive for ***.   All other systems are reviewed and negative.    PHYSICAL EXAM: VS:  There were no vitals taken for this visit. , BMI There is no height or weight on file to calculate BMI. GENERAL:  Well appearing NECK:  No jugular venous distention, waveform within normal limits, carotid upstroke brisk and symmetric, no bruits, no thyromegaly LUNGS:  Clear to auscultation bilaterally CHEST:  Unremarkable HEART:  PMI not displaced or sustained,S1 and S2 within normal limits, no S3, no S4, no clicks, no rubs, *** murmurs ABD:  Flat, positive bowel sounds normal in frequency in pitch, no bruits, no rebound, no guarding, no midline pulsatile mass, no hepatomegaly, no splenomegaly EXT:  2 plus pulses throughout, no edema, no cyanosis no clubbing    ***GENERAL:  Well appearing NECK:  No jugular venous distention, waveform within normal limits, carotid upstroke brisk and symmetric, no bruits, no thyromegaly LUNGS:  Clear to auscultation bilaterally BACK:  No CVA tenderness CHEST:  Unremarkable HEART:  PMI not displaced or sustained,S1 and S2 within normal limits, no S3, no S4, no clicks, no rubs, no murmurs ABD:  Flat, positive bowel sounds normal in frequency in pitch, no bruits, no rebound, no guarding, no midline pulsatile mass, no hepatomegaly, no splenomegaly EXT:  2 plus pulses throughout, no edema, no cyanosis no clubbing    EKG:  EKG is *** ordered today. The ekg ordered today  demonstrates sinus rhythm, rate ***, axis within normal limits, intervals within normal limits, no acute ST-T wave changes.   Recent Labs: 12/14/2018: ALT 16; BUN 11; Creatinine, Ser 0.83; Hemoglobin 12.9;  Platelets 225; Potassium 4.2; Sodium 134; TSH 1.670    Lipid Panel    Component Value Date/Time   CHOL 109 12/14/2018 1610   CHOL 124 08/11/2013 0947   TRIG 89 12/14/2018 1610   TRIG 127 03/07/2016 1142   TRIG 132 08/11/2013 0947   HDL 49 12/14/2018 1610   HDL 66 03/07/2016 1142   HDL 54 08/11/2013 0947   CHOLHDL 2.2 12/14/2018 1610   CHOLHDL 3.1 05/04/2009 0424   VLDL 20 05/04/2009 0424   LDLCALC 42 12/14/2018 1610   LDLCALC 72 09/20/2014 1200   LDLCALC 44 08/11/2013 0947      Wt Readings from Last 3 Encounters:  12/14/18 151 lb (68.5 kg)  08/10/18 151 lb (68.5 kg)  05/20/18 153 lb (69.4 kg)      Other studies Reviewed: Additional studies/ records that were reviewed today include:  *** Review of the above records demonstrates:  ***   ASSESSMENT AND PLAN:  CAD:  *** The patient has no new sypmtoms.  No further cardiovascular testing is indicated.  We will continue with aggressive risk reduction and meds as listed.  She does not have any other symptoms that she had at the time of her catheterization and PCI.  He had a negative stress test in 2013.   ATRIAL FIBRILLATION:  [name] has a CHA2DS2 - VASc score of 4 with a risk of stroke of 4%.  *** No change in therapy.   HTN:  The blood pressure is *** at target.  No change in therapy.   DIASTOLIC HF: ***  She seems to be euvolemic.  No change in therapy.   SNORING: She did previously agree to have a sleep study.  ***  finally agrees to have a sleep study.    Current medicines are reviewed at length with the patient today.  The patient does not have concerns regarding medicines.  The following changes have been made:  ***  Labs/ tests ordered today include: ***   No orders of the defined types were placed in this encounter.    Disposition:   FU with me in ***.    Signed, Minus Breeding, MD  02/21/2019 9:10 PM    [facility]

## 2019-02-23 ENCOUNTER — Ambulatory Visit: Payer: Medicare Other | Admitting: Cardiology

## 2019-02-25 ENCOUNTER — Telehealth: Payer: Self-pay | Admitting: Pulmonary Disease

## 2019-02-25 NOTE — Telephone Encounter (Signed)
Called and spoke with pt who stated she has been coughing and wheezing all week and thinks she may be getting bronchitis and was wanting something prescribed.  I stated to pt since we have not seen her since last OV 02/11/2017, we could not send in any meds without an appt. Pt stated to me she was unable to schedule an appt with [place] at this time so I advised her to call PCP to see if they might be able to send any meds in for her without an appt, go to urgent care to be seen, or go to the ER. Pt expressed understanding.nothing further needed.

## 2019-02-27 DIAGNOSIS — G968 Other specified disorders of central nervous system: Secondary | ICD-10-CM | POA: Diagnosis not present

## 2019-02-27 DIAGNOSIS — M899 Disorder of bone, unspecified: Secondary | ICD-10-CM | POA: Diagnosis not present

## 2019-02-27 DIAGNOSIS — R590 Localized enlarged lymph nodes: Secondary | ICD-10-CM | POA: Diagnosis not present

## 2019-03-10 ENCOUNTER — Other Ambulatory Visit: Payer: Self-pay | Admitting: Cardiology

## 2019-03-14 DIAGNOSIS — G894 Chronic pain syndrome: Secondary | ICD-10-CM | POA: Diagnosis not present

## 2019-03-14 DIAGNOSIS — I10 Essential (primary) hypertension: Secondary | ICD-10-CM | POA: Diagnosis not present

## 2019-03-14 DIAGNOSIS — I509 Heart failure, unspecified: Secondary | ICD-10-CM | POA: Diagnosis not present

## 2019-03-16 ENCOUNTER — Other Ambulatory Visit: Payer: Self-pay | Admitting: Nurse Practitioner

## 2019-03-18 ENCOUNTER — Telehealth: Payer: Self-pay | Admitting: Family Medicine

## 2019-03-18 NOTE — Telephone Encounter (Signed)
Pt called - discussed recent MRI

## 2019-03-18 NOTE — Telephone Encounter (Signed)
Please review

## 2019-03-22 DIAGNOSIS — R590 Localized enlarged lymph nodes: Secondary | ICD-10-CM | POA: Diagnosis not present

## 2019-03-22 DIAGNOSIS — R599 Enlarged lymph nodes, unspecified: Secondary | ICD-10-CM | POA: Diagnosis not present

## 2019-03-23 ENCOUNTER — Telehealth: Payer: Self-pay | Admitting: *Deleted

## 2019-03-23 NOTE — Telephone Encounter (Signed)
Spoke with pt regarding Prolia.  Per [facility] they have a Prolia Injection at their store for patient that they have been holding since January 2020.  Patient was waiting until after she had surgery 01/25/2019 to get injection.  Patient does still want to get injection.  Advised her that [facility] can deliver Prolia Injection to our office and we will keep refrigerated for her until she wants to get the injection.  She states she is not feeling very good today, and wants to wait until next week to get the injection.  Asked patient to call our office on the day she would like to get the injection.  Patient agreeable.  [facility] notified to deliver medication to [facility].

## 2019-04-01 NOTE — Progress Notes (Signed)
Virtual Visit via Telephone Note   This visit type was conducted due to national recommendations for restrictions regarding the COVID-19 Pandemic (e.g. social distancing) in an effort to limit this patient's exposure and mitigate transmission in our community.  Due to her co-morbid illnesses, this patient is at least at moderate risk for complications without adequate follow up.  This format is felt to be most appropriate for this patient at this time.  The patient did not have access to video technology/had technical difficulties with video requiring transitioning to audio format only (telephone).  All issues noted in this document were discussed and addressed.  No physical exam could be performed with this format.  Please refer to the patient's chart for her  consent to telehealth for [facility].   Evaluation Performed:  Follow-up visit  Date:  04/02/2019   ID:  [name], [name] 1936/09/08, MRN [id number]  Patient Location: Home Provider Location: Home  PCP:  Ernestina Penna, MD  Cardiologist:  Rollene Rotunda, MD  Electrophysiologist:  None   Chief Complaint:  Back pain  History of Present Illness:    [name] is a 83 y.o. female who presents for follow up of CAD.  She has PAF as well.  She wore a cardiac event monitor last year and had no significant arrhythmias.  Unfortunately since I have seen her she has had a difficult year.  She has a cystic structure on her spine and she had explanting of spinal cord stimulator.  They are wanting to do more evaluation therapy on this but a recent MRI demonstrated multiple enlarged retroperitoneal lymph nodes concerning for an underlying lymphoproliferative disorder such as lymphoma or metastatic disease and she is got to have this evaluated first.  This was at [facility] and I did review these records.  She has significant back pain with a full limits her.  She does not really get out very much.  She does not expose herself to the  virus at least.  She denies any chest pressure, neck or arm discomfort.  She denies any palpitations really.  She might occasionally have some and she will take a nitroglycerin for this.  She does not have any of the symptoms that she had with her previous angina.  Of note I did send her for a sleep study this year and she did not have any sleep apnea.  She had some restless legs.  She says however now that she sleeps well as she takes Xanax.  The patient does not have symptoms concerning for COVID-19 infection (fever, chills, cough, or new shortness of breath).    Past Medical History:  Diagnosis Date  . Anxiety   . Arthritis   . Asthmatic bronchitis   . Atrial fibrillation (HCC)   . Bowel obstruction (HCC)    blockage  . CAD (coronary artery disease)    Stent to RI 2003.  Myoview 2013 no ischemia.  . Cataract   . Chronic back pain   . Chronic bronchitis (HCC)   . Colon polyp    adenomatous  . Congestive heart disease (HCC)    Preserved EF  . Depression   . Esophageal motility disorder   . Fibromyalgia   . GERD (gastroesophageal reflux disease)   . History of hiatal hernia   . History of kidney stones   . Hyperlipidemia   . Hypertension   . Hypothyroid   . 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  Nephrolithiasis   . OA (osteoarthritis)   . Status post dilation of esophageal narrowing    Past Surgical History:  Procedure Laterality Date  . ABDOMINAL HYSTERECTOMY     partial  . ANKLE FUSION  08/27/2012   Procedure: ARTHRODESIS ANKLE;  Surgeon: [name], MD;  Location: [facility] OR;  Service: Orthopedics;  Laterality: Right;  Arthrodesis right ankle and subtalar joint  . APPENDECTOMY    . ARTHRODESIS TIBIOFIBULAR Right 03/31/2014   Procedure: REVISION OF SUBTALOR ARTHRODESIS  RIGHT ;  Surgeon: [name], MD;  Location: [facility] OR;  Service: Orthopedics;  Laterality: Right;  . BACK SURGERY    . BREAST SURGERY     Left breast lump removed  . BRONCHOSCOPY     . CORONARY ANGIOPLASTY WITH STENT PLACEMENT  2003  . DECORTICATION Right 05/03/2014   Procedure: DECORTICATION;  Surgeon: Delight Ovens, MD;  Location: [facility] OR;  Service: Thoracic;  Laterality: Right;  . EYE SURGERY  2016   cateracts  . FOOT ARTHRODESIS, SUBTALAR Right 2013  . HARDWARE REMOVAL Right 03/31/2014   Procedure: REMOVAL OF DEEP IMPLANTS X 3  RIGHT ;  Surgeon: [name], MD;  Location: [facility] OR;  Service: Orthopedics;  Laterality: Right;  . HARDWARE REMOVAL Right 11/03/2014   Procedure: HARDWARE REMOVAL OF SUBTALAR JOINT;  Surgeon: [name], MD;  Location: [facility];  Service: Orthopedics;  Laterality: Right;  . HARDWARE REVISION  03/31/2014   SUBTALOR  ARTHRODESIS       DR HEWITT  . JOINT REPLACEMENT     Right knee  . KNEE ARTHROSCOPY  03/26/2012   Procedure: ARTHROSCOPY KNEE;  Surgeon: [name], MD;  Location: [facility] OR;  Service: Orthopedics;  Laterality: Left;  with Debridement of Lateral Meniscus tear  . REMOVAL OF IMPLANT Right 03/31/2014   DR HEWITT  . ROTATOR CUFF REPAIR Bilateral   . SINUS SURGERY WITH INSTATRAK    . SPINAL CORD STIMULATOR INSERTION N/A 07/02/2017   Procedure: LUMBAR SPINAL CORD STIMULATOR INSERTION;  Surgeon: Venita Lick, MD;  Location: [facility] OR;  Service: Orthopedics;  Laterality: N/A;  120 mins  . TOTAL KNEE ARTHROPLASTY Right   . VIDEO ASSISTED THORACOSCOPY (VATS)/EMPYEMA Right 05/03/2014   Procedure: VIDEO ASSISTED THORACOSCOPY (VATS)/EMPYEMA;  Surgeon: Delight Ovens, MD;  Location: [facility] OR;  Service: Thoracic;  Laterality: Right;  Marland Kitchen VIDEO BRONCHOSCOPY N/A 05/03/2014   Procedure: VIDEO BRONCHOSCOPY;  Surgeon: Delight Ovens, MD;  Location: [facility] OR;  Service: Thoracic;  Laterality: N/A;     Prior to Admission medications   Medication Sig Start Date End Date Taking? Authorizing Provider  acetaminophen (TYLENOL) 650 MG CR tablet Take 650-1,300 mg by mouth every 8 (eight) hours as needed for pain. Depends on pain level if takes 1-2  tablets   Yes [provider]  albuterol (PROAIR HFA) 108 (90 Base) MCG/ACT inhaler USE 2 PUFFS EVERY 6 HOURS AS NEEDED FOR WHEEZING 08/10/18  Yes Ernestina Penna, MD  ALPRAZolam Prudy Feeler) 0.5 MG tablet TAKE 2 TABLETS AT BEDTIME 02/09/19  Yes Ernestina Penna, MD  Ascorbic Acid (VITAMIN C) 1000 MG tablet Take 1,000 mg by mouth daily.   Yes [provider]  benzonatate (TESSALON) 100 MG capsule TAKE 1 CAPSULE TWICE DAILY AS NEEDED FOR COUGH 03/16/19  Yes Ernestina Penna, MD  beta carotene 46962 UNIT capsule Take 25,000 Units by mouth daily.   Yes [provider]  budesonide (PULMICORT) 0.5 MG/2ML nebulizer solution Take 2 mLs (0.5 mg total) by  nebulization 2 (two) times daily. Dx 496 Patient taking differently: Take 0.5 mg by nebulization 2 (two) times daily as needed (shortness of breath). Dx 496 02/04/17  Yes Leslye Peer, MD  Calcium Carbonate-Vitamin D (CALCIUM 600+D) 600-400 MG-UNIT per tablet Take 1 tablet by mouth daily at 6 PM.    Yes [provider]  cholecalciferol (VITAMIN D) 1000 UNITS tablet Take 1,000 Units by mouth 2 (two) times daily with breakfast and lunch.    Yes [provider]  Coenzyme Q10 (COQ10) 100 MG CAPS Take 100 mg by mouth daily.   Yes [provider]  denosumab (PROLIA) 60 MG/ML SOSY injection Inject 60 mg every 6 (six) months into the skin. Administer in upper arm, thigh, or abdomen 01/15/19  Yes Ernestina Penna, MD  dicyclomine (BENTYL) 10 MG capsule TAKE (1) OR (2) CAPSULES EVERY SIX HOURS AS NEEDED FOR SPASM 02/11/18  Yes Ernestina Penna, MD  diltiazem (CARDIZEM CD) 120 MG 24 hr capsule TAKE (1) CAPSULE DAILY 04/14/18  Yes Rollene Rotunda, MD  fluticasone [facility]) 50 MCG/ACT nasal spray 2 SPRAYS IN EACH NOSTRIL ONCE A DAY 09/17/18  Yes Ernestina Penna, MD  HYDROcodone-acetaminophen (NORCO) 5-325 MG tablet Take 1 tablet by mouth every 4 (four) hours as needed. 07/03/17  Yes Venita Lick, MD  levothyroxine (SYNTHROID,  LEVOTHROID) 25 MCG tablet TAKE 2 TABLETS DAILY AS DIRECTED 02/09/19  Yes Ernestina Penna, MD  loratadine (CLARITIN) 10 MG tablet Take 1 tablet (10 mg total) by mouth daily. 02/16/18  Yes Ernestina Penna, MD  meclizine (ANTIVERT) 25 MG tablet TAKE (1) TABLET THREE TIMES DAILY AS NEEDED FOR DIZZINESS. 08/10/18  Yes Ernestina Penna, MD  Melatonin 5 MG TABS Take 10 mg by mouth every evening.   Yes [provider]  metoprolol tartrate (LOPRESSOR) 25 MG tablet TAKE 2 TABLETS EVERY MORNING AND 1 EVERY EVENING 03/10/19  Yes Nori Winegar, Fayrene Fearing, MD  montelukast (SINGULAIR) 10 MG tablet Take 1 tablet (10 mg total) by mouth daily. 02/09/19  Yes Ernestina Penna, MD  nitroGLYCERIN (NITROSTAT) 0.4 MG SL tablet 1 tablet under the tongue every 5 minutes as needed for chest pain. 08/10/18  Yes Ernestina Penna, MD  omega-3 acid ethyl esters (LOVAZA) 1 g capsule TAKE (2) CAPSULES TWICE DAILY. 02/09/19  Yes Ernestina Penna, MD  omeprazole-sodium bicarbonate (ZEGERID) 40-1100 MG capsule Take 1 capsule by mouth daily. 11/06/18  Yes Unk Lightning, PA  ondansetron (ZOFRAN) 4 MG tablet TAKE 1 TABLET EVERY 12 HOURS AS NEEDED FOR NAUSEA 10/05/18  Yes Ernestina Penna, MD  rosuvastatin (CRESTOR) 20 MG tablet Take 1 tablet (20 mg total) by mouth daily. 02/09/19  Yes Ernestina Penna, MD  sertraline (ZOLOFT) 100 MG tablet Take 1 tablet (100 mg total) by mouth daily. 02/09/19  Yes Ernestina Penna, MD  tamsulosin [facility]) 0.4 MG CAPS capsule TAKE (1) CAPSULE DAILY 01/01/19  Yes Ernestina Penna, MD  XARELTO 20 MG TABS tablet Take 1 tablet (20 mg total) by mouth daily. 02/09/19  Yes Ernestina Penna, MD     Allergies:   Naproxen; Penicillins; Sulfonamide derivatives; Aspirin; Captopril; and Clindamycin/lincomycin   Social History   Tobacco Use  . Smoking status: Former Smoker    Packs/day: 1.00    Years: 30.00    Pack years: 30.00    Types: Cigarettes    Last attempt to quit: 12/16/1989    Years since quitting: 29.3  .  Smokeless tobacco: Never Used  .  Tobacco comment: smoked off & on  Substance Use Topics  . Alcohol use: No    Alcohol/week: 0.0 standard drinks  . Drug use: No     Family Hx: The patient's family history includes Asthma in her mother; COPD in her sister; Cancer in her brother; Congestive Heart Failure in her mother; Emphysema in her brother and sister; Heart disease in her mother and sister; Prostate cancer in her brother; Stroke in her sister. There is no history of Rheumatologic disease.  ROS:   Please see the history of present illness.    As stated in the HPI and negative for all other systems.  Prior CV studies:   The following studies were reviewed today:    Labs/Other Tests and Data Reviewed:    EKG:  No ECG reviewed.  Recent Labs: 12/14/2018: ALT 16; BUN 11; Creatinine, Ser 0.83; Hemoglobin 12.9; Platelets 225; Potassium 4.2; Sodium 134; TSH 1.670   Recent Lipid Panel Lab Results  Component Value Date/Time   CHOL 109 12/14/2018 04:10 PM   CHOL 124 08/11/2013 09:47 AM   TRIG 89 12/14/2018 04:10 PM   TRIG 127 03/07/2016 11:42 AM   TRIG 132 08/11/2013 09:47 AM   HDL 49 12/14/2018 04:10 PM   HDL 66 03/07/2016 11:42 AM   HDL 54 08/11/2013 09:47 AM   CHOLHDL 2.2 12/14/2018 04:10 PM   CHOLHDL 3.1 05/04/2009 04:24 AM   LDLCALC 42 12/14/2018 04:10 PM   LDLCALC 72 09/20/2014 12:00 PM   LDLCALC 44 08/11/2013 09:47 AM    Wt Readings from Last 3 Encounters:  04/02/19 144 lb (65.3 kg)  12/14/18 151 lb (68.5 kg)  08/10/18 151 lb (68.5 kg)     Objective:    Vital Signs:  BP 138/80   Pulse 66   Ht 5\' 1"  (1.549 m)   Wt 144 lb (65.3 kg)   BMI 27.21 kg/m     ASSESSMENT & PLAN:    CAD:  The patient has no new sypmtoms.  No further cardiovascular testing is indicated.  We will continue with aggressive risk reduction and meds as listed.  ATRIAL FIBRILLATION:     She rarely feels palpitations. [name] M Sharpehas a CHA2DS2 - VASc score of 4 with a risk of stroke  of 4%.   She tolerates anticoagulation.   HTN:   The blood pressure is at target. No change in medications is indicated. We will continue with therapeutic lifestyle changes (TLC).  DIASTOLIC HF:     By history she seems to be euvolemic.  No change in therapy.    SNORING:    She did not have sleep apnea.  I reviewed these results with her today.   COVID-19 Education: The signs and symptoms of COVID-19 were discussed with the patient and how to seek care for testing (follow up with PCP or arrange E-visit).  She stays in. The importance of social distancing was discussed today.  Time:   Today, I have spent 20 minutes with the patient with telehealth technology discussing the above problems.     Medication Adjustments/Labs and Tests Ordered: Current medicines are reviewed at length with the patient today.  Concerns regarding medicines are outlined above.   Tests Ordered: No orders of the defined types were placed in this encounter.   Medication Changes: No orders of the defined types were placed in this encounter.   Disposition:  Follow up 12 months  Signed, Rollene Rotunda, MD  04/02/2019 12:06 PM    [facility]

## 2019-04-02 ENCOUNTER — Encounter: Payer: Self-pay | Admitting: Cardiology

## 2019-04-02 ENCOUNTER — Telehealth (INDEPENDENT_AMBULATORY_CARE_PROVIDER_SITE_OTHER): Payer: Medicare Other | Admitting: Cardiology

## 2019-04-02 VITALS — BP 138/80 | HR 66 | Ht 61.0 in | Wt 144.0 lb

## 2019-04-02 DIAGNOSIS — R002 Palpitations: Secondary | ICD-10-CM

## 2019-04-02 DIAGNOSIS — I480 Paroxysmal atrial fibrillation: Secondary | ICD-10-CM

## 2019-04-02 NOTE — Patient Instructions (Signed)

## 2019-04-14 ENCOUNTER — Ambulatory Visit (INDEPENDENT_AMBULATORY_CARE_PROVIDER_SITE_OTHER): Payer: Medicare Other | Admitting: Family Medicine

## 2019-04-14 ENCOUNTER — Other Ambulatory Visit: Payer: Self-pay

## 2019-04-14 DIAGNOSIS — I5032 Chronic diastolic (congestive) heart failure: Secondary | ICD-10-CM | POA: Diagnosis not present

## 2019-04-14 DIAGNOSIS — I480 Paroxysmal atrial fibrillation: Secondary | ICD-10-CM | POA: Diagnosis not present

## 2019-04-14 DIAGNOSIS — G894 Chronic pain syndrome: Secondary | ICD-10-CM

## 2019-04-14 DIAGNOSIS — I509 Heart failure, unspecified: Secondary | ICD-10-CM | POA: Diagnosis not present

## 2019-04-14 DIAGNOSIS — K219 Gastro-esophageal reflux disease without esophagitis: Secondary | ICD-10-CM

## 2019-04-14 DIAGNOSIS — E559 Vitamin D deficiency, unspecified: Secondary | ICD-10-CM | POA: Diagnosis not present

## 2019-04-14 DIAGNOSIS — I10 Essential (primary) hypertension: Secondary | ICD-10-CM

## 2019-04-14 DIAGNOSIS — R2681 Unsteadiness on feet: Secondary | ICD-10-CM

## 2019-04-14 DIAGNOSIS — M25571 Pain in right ankle and joints of right foot: Secondary | ICD-10-CM

## 2019-04-14 DIAGNOSIS — E039 Hypothyroidism, unspecified: Secondary | ICD-10-CM | POA: Diagnosis not present

## 2019-04-14 DIAGNOSIS — K222 Esophageal obstruction: Secondary | ICD-10-CM

## 2019-04-14 DIAGNOSIS — E782 Mixed hyperlipidemia: Secondary | ICD-10-CM

## 2019-04-14 NOTE — Progress Notes (Signed)
Virtual Visit Via telephone Note I connected with@ on 04/14/19 by telephone and verified that I am speaking with the correct person or authorized healthcare agent using two identifiers. [name] is currently located at home and there are no unauthorized people in close proximity. I completed this visit while in a private location in my home.  Connected to the patient by telephone and verified that I was speaking with the correct person.  She had a visit with Dr. Percival Spanish and he felt like cardiovascular wise that everything was stable and this was done this month.  He will see her back again in 1 year.  This visit type was conducted due to national recommendations for restrictions regarding the COVID-19 Pandemic (e.g. social distancing).  This format is felt to be most appropriate for this patient at this time.  All issues noted in this document were discussed and addressed.  No physical exam was performed.    I discussed the limitations, risks, security and privacy concerns of performing an evaluation and management service by telephone and the availability of in person appointments. I also discussed with the patient that there may be a patient responsible charge related to this service. The patient expressed understanding and agreed to proceed.   Date:  04/14/2019    ID:  [name]      December 23, 1935        [id number]   Patient Care Team Patient Care Team: Chipper Herb, MD as PCP - Cheral Bay, MD as PCP - Cardiology (Cardiology) [place], Mamie Laurel., DPM as Consulting Physician (Podiatry) Irene Shipper, MD as Consulting Physician (Gastroenterology)  Reason for Visit: Primary Care Follow-up     History of Present Illness & Review of Systems:     [name] is a 83 y.o. year old female primary care patient that presents today for a telehealth visit.  Is pleasant and alert and pretty much confined to her home.  She is currently waiting to follow through with some  studies of some increased lymph nodes in her abdomen.  She had an MRI and before anything could be done about a cyst on her spine she had to have a CT scan to further evaluate lymph nodes and has a visit scheduled with the oncologist after the scan is done for the lymph nodes in her abdomen.  She will keep [place] posted about the findings regarding this.  In the meantime she has not had any chest pain or shortness of breath anymore than usual she has not had any abdominal pain other than minor pain.  Her weight is down a few pounds and she does not know why.  This is about 10 pounds.  She says she continues to feel somewhat full in her abdomen and this may be attributed somewhat to her pain medicine that she takes for her back and her feet.  She otherwise has not seen any blood in the stool or had any black tarry bowel movements.  She has medicine that she takes for nausea if needed and is taking a proton pump inhibitor.  She has some problems with frequency and slight burning but not to a severe extent.  She has her ongoing pain in her low back especially if she is on her feet and has continued severe pain in both feet.  She is followed by Dr. Harriette Ohara, her orthopedist about this.  She is currently being followed by the orthopedist the cardiologist  and the neurologist.  She is also going to have this appointment with the oncologist once the CT scan for the lymph nodes is done.  Review of systems as stated otherwise negative for body systems left unmentioned.   The patient does not have symptoms concerning for COVID-19 infection (fever, chills, cough, or new shortness of breath).      Current Medications (Verified) Allergies as of 04/14/2019      Reactions   Naproxen Other (See Comments)   Tongue swelling   Penicillins Swelling   Swelling around site Has patient had a PCN reaction causing immediate rash, facial/tongue/throat swelling, SOB or lightheadedness with hypotension: Yes Has patient had a PCN  reaction causing severe rash involving mucus membranes or skin necrosis: No Has patient had a PCN reaction that required hospitalization: No Has patient had a PCN reaction occurring within the last 10 years: No If all of the above answers are "NO", then may proceed with Cephalosporin use.   Sulfonamide Derivatives Hives   Aspirin Other (See Comments)   REACTION: regular strength causes "heart to beat fast"   Captopril Hypertension   Clindamycin/lincomycin Other (See Comments)   unknown      Medication List       Accurate as of April 14, 2019 12:11 PM. Always use your most recent med list.        acetaminophen 650 MG CR tablet Commonly known as:  TYLENOL Take 650-1,300 mg by mouth every 8 (eight) hours as needed for pain. Depends on pain level if takes 1-2 tablets   albuterol 108 (90 Base) MCG/ACT inhaler Commonly known as:  ProAir HFA USE 2 PUFFS EVERY 6 HOURS AS NEEDED FOR WHEEZING   ALPRAZolam 0.5 MG tablet Commonly known as:  XANAX TAKE 2 TABLETS AT BEDTIME   benzonatate 100 MG capsule Commonly known as:  TESSALON TAKE 1 CAPSULE TWICE DAILY AS NEEDED FOR COUGH   beta carotene 25000 UNIT capsule Take 25,000 Units by mouth daily.   budesonide 0.5 MG/2ML nebulizer solution Commonly known as:  Pulmicort Take 2 mLs (0.5 mg total) by nebulization 2 (two) times daily. Dx 496   Calcium 600+D 600-400 MG-UNIT tablet Generic drug:  Calcium Carbonate-Vitamin D Take 1 tablet by mouth daily at 6 PM.   cholecalciferol 1000 units tablet Commonly known as:  VITAMIN D Take 1,000 Units by mouth 2 (two) times daily with breakfast and lunch.   CoQ10 100 MG Caps Take 100 mg by mouth daily.   denosumab 60 MG/ML Sosy injection Commonly known as:  Prolia Inject 60 mg every 6 (six) months into the skin. Administer in upper arm, thigh, or abdomen   dicyclomine 10 MG capsule Commonly known as:  BENTYL TAKE (1) OR (2) CAPSULES EVERY SIX HOURS AS NEEDED FOR SPASM   diltiazem 120 MG  24 hr capsule Commonly known as:  CARDIZEM CD TAKE (1) CAPSULE DAILY   fluticasone 50 MCG/ACT nasal spray Commonly known as:  FLONASE 2 SPRAYS IN EACH NOSTRIL ONCE A DAY   HYDROcodone-acetaminophen 5-325 MG tablet Commonly known as:  Norco Take 1 tablet by mouth every 4 (four) hours as needed.   levothyroxine 25 MCG tablet Commonly known as:  SYNTHROID TAKE 2 TABLETS DAILY AS DIRECTED   loratadine 10 MG tablet Commonly known as:  CLARITIN Take 1 tablet (10 mg total) by mouth daily.   meclizine 25 MG tablet Commonly known as:  ANTIVERT TAKE (1) TABLET THREE TIMES DAILY AS NEEDED FOR DIZZINESS.   Melatonin 5 MG  Tabs Take 10 mg by mouth every evening.   metoprolol tartrate 25 MG tablet Commonly known as:  LOPRESSOR TAKE 2 TABLETS EVERY MORNING AND 1 EVERY EVENING   montelukast 10 MG tablet Commonly known as:  SINGULAIR Take 1 tablet (10 mg total) by mouth daily.   nitroGLYCERIN 0.4 MG SL tablet Commonly known as:  NITROSTAT 1 tablet under the tongue every 5 minutes as needed for chest pain.   omega-3 acid ethyl esters 1 g capsule Commonly known as:  LOVAZA TAKE (2) CAPSULES TWICE DAILY.   omeprazole-sodium bicarbonate 40-1100 MG capsule Commonly known as:  ZEGERID Take 1 capsule by mouth daily.   ondansetron 4 MG tablet Commonly known as:  ZOFRAN TAKE 1 TABLET EVERY 12 HOURS AS NEEDED FOR NAUSEA   rosuvastatin 20 MG tablet Commonly known as:  CRESTOR Take 1 tablet (20 mg total) by mouth daily.   sertraline 100 MG tablet Commonly known as:  ZOLOFT Take 1 tablet (100 mg total) by mouth daily.   tamsulosin 0.4 MG Caps capsule Commonly known as:  FLOMAX TAKE (1) CAPSULE DAILY   vitamin C 1000 MG tablet Take 1,000 mg by mouth daily.   Xarelto 20 MG Tabs tablet Generic drug:  rivaroxaban Take 1 tablet (20 mg total) by mouth daily.           Allergies (Verified)    Naproxen; Penicillins; Sulfonamide derivatives; Aspirin; Captopril; and  Clindamycin/lincomycin  Past Medical History Past Medical History:  Diagnosis Date   Anxiety    Arthritis    Asthmatic bronchitis    Atrial fibrillation (HCC)    Bowel obstruction (HCC)    blockage   CAD (coronary artery disease)    Stent to RI 2003.  Myoview 2013 no ischemia.   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    history of spinal   Nephrolithiasis    OA (osteoarthritis)    Status post dilation of esophageal narrowing      Past Surgical History:  Procedure Laterality Date   ABDOMINAL HYSTERECTOMY     partial   ANKLE FUSION  08/27/2012   Procedure: ARTHRODESIS ANKLE;  Surgeon: Wylene Simmer, MD;  Location: [facility];  Service: Orthopedics;  Laterality: Right;  Arthrodesis right ankle and subtalar joint   APPENDECTOMY     ARTHRODESIS TIBIOFIBULAR Right 03/31/2014   Procedure: REVISION OF SUBTALOR ARTHRODESIS  RIGHT ;  Surgeon: Wylene Simmer, MD;  Location: [facility];  Service: Orthopedics;  Laterality: Right;   BACK SURGERY     BREAST SURGERY     Left breast lump removed   BRONCHOSCOPY     CORONARY ANGIOPLASTY WITH STENT PLACEMENT  2003   DECORTICATION Right 05/03/2014   Procedure: DECORTICATION;  Surgeon: [name], MD;  Location: [facility];  Service: Thoracic;  Laterality: Right;   EYE SURGERY  2016   cateracts   FOOT ARTHRODESIS, SUBTALAR Right 2013   HARDWARE REMOVAL Right 03/31/2014   Procedure: REMOVAL OF DEEP IMPLANTS X 3  RIGHT ;  Surgeon: Wylene Simmer, MD;  Location: [facility];  Service: Orthopedics;  Laterality: Right;   HARDWARE REMOVAL Right 11/03/2014   Procedure: HARDWARE  REMOVAL OF SUBTALAR JOINT;  Surgeon: Wylene Simmer, MD;  Location: [facility];  Service: Orthopedics;  Laterality: Right;   HARDWARE REVISION  03/31/2014   SUBTALOR  ARTHRODESIS       DR HEWITT   JOINT REPLACEMENT     Right knee   KNEE ARTHROSCOPY  03/26/2012   Procedure: ARTHROSCOPY KNEE;  Surgeon: Wylene Simmer, MD;  Location: [facility];  Service: Orthopedics;  Laterality: Left;  with Debridement of Lateral Meniscus tear   REMOVAL OF IMPLANT Right 03/31/2014   DR HEWITT   ROTATOR CUFF REPAIR Bilateral    SINUS SURGERY WITH INSTATRAK     SPINAL CORD STIMULATOR INSERTION N/A 07/02/2017   Procedure: LUMBAR SPINAL CORD STIMULATOR INSERTION;  Surgeon: Melina Schools, MD;  Location: [facility];  Service: Orthopedics;  Laterality: N/A;  120 mins   TOTAL KNEE ARTHROPLASTY Right    VIDEO ASSISTED THORACOSCOPY (VATS)/EMPYEMA Right 05/03/2014   Procedure: VIDEO ASSISTED THORACOSCOPY (VATS)/EMPYEMA;  Surgeon: [name], MD;  Location: [facility];  Service: Thoracic;  Laterality: Right;   VIDEO BRONCHOSCOPY N/A 05/03/2014   Procedure: VIDEO BRONCHOSCOPY;  Surgeon: [name], MD;  Location: [facility];  Service: Thoracic;  Laterality: N/A;    Social History   Socioeconomic History   Marital status: Widowed    Spouse name: Not on file   Number of children: 2   Years of education: Not on file   Highest education level: Not on file  Occupational History   Occupation: Retired  Scientist, product/process development strain: Not on file   Food insecurity:    Worry: Not on file    Inability: Not on file   Transportation needs:    Medical: Not on file    Non-medical: Not on file  Tobacco Use   Smoking status: Former Smoker    Packs/day: 1.00    Years: 30.00    Pack years: 30.00    Types: Cigarettes    Last attempt to quit: 12/16/1989    Years since quitting: 29.3   Smokeless tobacco: Never Used   Tobacco comment: smoked off & on  Substance and Sexual Activity   Alcohol  use: No    Alcohol/week: 0.0 standard drinks   Drug use: No   Sexual activity: Never  Lifestyle   Physical activity:    Days per week: Not on file    Minutes per session: Not on file   Stress: Not on file  Relationships   Social connections:    Talks on phone: Not on file    Gets together: Not on file    Attends religious service: Not on file    Active member of club or organization: Not on file    Attends meetings of clubs or organizations: Not on file    Relationship status: Not on file  Other Topics Concern   Not on file  Social History Narrative   Originally from [place]. Always lived in [place]. No international travel. Has prior travel to [place], [place], [place], [place], [place], & [place]. No recent travel. She has a cat currently. No prior bird, mold, or hot tub exposure. 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 No known asbestos exposure. 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      Family History  Problem Relation Age  of Onset   Prostate cancer Brother    Cancer Brother        PROSTATE   Heart disease Mother    Asthma Mother    Congestive Heart Failure Mother    Emphysema Sister    COPD Sister    Stroke Sister    Heart disease Sister    Emphysema Brother    Rheumatologic disease Neg Hx       Labs/Other Tests and Data Reviewed:    Wt Readings from Last 3 Encounters:  04/02/19 144 lb (65.3 kg)  12/14/18 151 lb (68.5 kg)  08/10/18 151 lb (68.5 kg)   Temp Readings from Last 3 Encounters:  12/14/18 (!) 97.3 F (36.3 C) (Oral)  08/10/18 (!) 97.3 F (36.3 C) (Oral)  05/20/18 (!) 97.3 F (36.3 C) (Oral)   BP Readings from Last 3 Encounters:  04/02/19 138/80  12/14/18 130/60  08/10/18 121/64   Pulse Readings from Last 3 Encounters:  04/02/19 66  12/14/18 64  08/10/18 (!) 59     No results found for: HGBA1C Lab Results  Component Value Date   LDLCALC 42 12/14/2018   CREATININE  0.83 12/14/2018       Chemistry      Component Value Date/Time   NA 134 12/14/2018 1610   K 4.2 12/14/2018 1610   CL 96 12/14/2018 1610   CO2 25 12/14/2018 1610   BUN 11 12/14/2018 1610   CREATININE 0.83 12/14/2018 1610   CREATININE 0.60 08/11/2013 0947      Component Value Date/Time   CALCIUM 9.8 12/14/2018 1610   ALKPHOS 109 12/14/2018 1610   AST 25 12/14/2018 1610   ALT 16 12/14/2018 1610   BILITOT 0.4 12/14/2018 1610         OBSERVATIONS/ OBJECTIVE:     Patient is alert and relates her history well and all the things are going on with her currently.  She says her blood pressures have been running in the 120s over the 80s or less.  Her weight is 143 and she is lost 10 pounds recently but not on purpose.  She is due to get lab work.  She is also due to get her Prolia shot.  We will try to arrange this at a convenient time either before or after she gets the CT scan to further evaluate the lymph nodes in her abdomen.  She will let us know the results from the CT scan and the lymph nodes.  Her allergies and meds were reviewed.  She will try using the Flonase a little more regularly instead of as needed and as well also the albuterol inhaler to keep her airways more open during this heavy pollen season.  Physical exam deferred due to nature of telephonic visit.  ASSESSMENT & PLAN    Time:   Today, I have spent 31 minutes with the patient via telephone discussing the above including Covid precautions.     Visit Diagnoses: 1. Essential hypertension -Continue current treatment  2. Vitamin D deficiency -Continue with vitamin D replacement  3. Paroxysmal atrial fibrillation (HCC) -No complaints today with palpitations and she will continue to follow-up with the cardiologist as needed  4. Hypothyroidism, unspecified type -Continue with thyroid treatment pending results of lab work  5. Chronic diastolic heart failure ([place]) -Follow-up with cardiology as planned  6. Gait  instability -Continue to follow-up with orthopedist and make all efforts to not put herself at risk for falling  7. Mixed hyperlipidemia -Continue with Crestor  therapeutic lifestyle changes and omega-3 fatty acids  8. Gastroesophageal reflux disease, esophagitis presence not specified -Continue with Zegerid and diet of avoidance  9. Chronic pain syndrome -Continue with pain medicines as doing and take as little as possible -Continue to follow-up with neurology and orthopedics  10. Right ankle pain, unspecified chronicity -Continue to follow-up with orthopedics  11. ESOPHAGEAL STRICTURE -Currently no complaints with swallowing or heartburn as long as she is taking the Zegerid regularly.  12.  Chronic bronchitis and COPD -Continue with regular use of inhaler  13.  Allergic rhinitis -Continue regular use of Flonase at least 1 spray each nostril daily  Patient Instructions  Follow-up with neurology, oncology, orthopedics, and cardiology as planned. Continue to make every effort so as not to put self at risk for falling Come to the lab for blood work and Prolia injection Drink plenty of water and fluids Continue to practice good respiratory and hand hygiene Use Flonase more regularly Use albuterol inhaler a little more regularly during this pollen season        The above assessment and management plan was discussed with the patient. The patient verbalized understanding of and has agreed to the management plan. Patient is aware to call the clinic if symptoms persist or worsen. Patient is aware when to return to the clinic for a follow-up visit. Patient educated on when it is appropriate to go to the emergency department.    Chipper Herb, MD [address], [place], [place] [address] Ph [phone]   Arrie Senate MD

## 2019-04-14 NOTE — Addendum Note (Signed)
Addended by: Zannie Cove on: 04/14/2019 01:06 PM   Modules accepted: Orders

## 2019-04-14 NOTE — Patient Instructions (Signed)
Follow-up with neurology, oncology, orthopedics, and cardiology as planned. Continue to make every effort so as not to put self at risk for falling Come to the lab for blood work and Prolia injection Drink plenty of water and fluids Continue to practice good respiratory and hand hygiene Use Flonase more regularly Use albuterol inhaler a little more regularly during this pollen season

## 2019-04-14 NOTE — Telephone Encounter (Signed)
TELE visit with [place] today,04/14/19:  Pt states that she is ready to get her PROLIA. She is also needing labs for DWM. -hopefully she can do both at the [facility] visit to [facility].

## 2019-04-15 NOTE — Telephone Encounter (Signed)
Scheduled patient for prolia injection and labs 04/16/2019.

## 2019-04-16 ENCOUNTER — Other Ambulatory Visit: Payer: Self-pay

## 2019-04-16 ENCOUNTER — Telehealth: Payer: Self-pay | Admitting: Family Medicine

## 2019-04-16 ENCOUNTER — Ambulatory Visit: Payer: Self-pay

## 2019-04-16 NOTE — Telephone Encounter (Signed)
Spoke with pt regarding Prolia inj appt scheduled

## 2019-04-16 NOTE — Telephone Encounter (Signed)
Pt is wanting to talk to Zigmund Daniel about her prolia, pt also states on phone that she is having a really hard time with her walking.

## 2019-04-19 ENCOUNTER — Other Ambulatory Visit: Payer: Self-pay

## 2019-04-19 DIAGNOSIS — R590 Localized enlarged lymph nodes: Secondary | ICD-10-CM | POA: Diagnosis not present

## 2019-04-20 ENCOUNTER — Ambulatory Visit: Payer: Medicare Other

## 2019-04-22 DIAGNOSIS — R590 Localized enlarged lymph nodes: Secondary | ICD-10-CM | POA: Diagnosis not present

## 2019-04-22 DIAGNOSIS — C8598 Non-Hodgkin lymphoma, unspecified, lymph nodes of multiple sites: Secondary | ICD-10-CM | POA: Diagnosis not present

## 2019-04-23 DIAGNOSIS — I252 Old myocardial infarction: Secondary | ICD-10-CM | POA: Diagnosis not present

## 2019-04-23 DIAGNOSIS — I509 Heart failure, unspecified: Secondary | ICD-10-CM | POA: Diagnosis not present

## 2019-04-23 DIAGNOSIS — J449 Chronic obstructive pulmonary disease, unspecified: Secondary | ICD-10-CM | POA: Diagnosis not present

## 2019-04-23 DIAGNOSIS — C8590 Non-Hodgkin lymphoma, unspecified, unspecified site: Secondary | ICD-10-CM | POA: Diagnosis not present

## 2019-04-23 DIAGNOSIS — I110 Hypertensive heart disease with heart failure: Secondary | ICD-10-CM | POA: Diagnosis not present

## 2019-05-05 ENCOUNTER — Ambulatory Visit (INDEPENDENT_AMBULATORY_CARE_PROVIDER_SITE_OTHER): Payer: Medicare Other | Admitting: Nurse Practitioner

## 2019-05-05 ENCOUNTER — Other Ambulatory Visit: Payer: Self-pay

## 2019-05-05 ENCOUNTER — Encounter: Payer: Self-pay | Admitting: Nurse Practitioner

## 2019-05-05 DIAGNOSIS — I10 Essential (primary) hypertension: Secondary | ICD-10-CM | POA: Diagnosis not present

## 2019-05-05 MED ORDER — LOSARTAN POTASSIUM 100 MG PO TABS: 100.0000 mg | ORAL_TABLET | Freq: Every day | ORAL | 3 refills | Status: DC

## 2019-05-05 NOTE — Progress Notes (Signed)
   Virtual Visit via telephone Note  I connected with [name] on 05/05/19 at 4:55 PM by telephone and verified that I am speaking with the correct person using two identifiers. [name] is currently located at home and  No one is currently with her during visit. The provider, Mary-Margaret Hassell Done, FNP is located in their office at time of visit.  I discussed the limitations, risks, security and privacy concerns of performing an evaluation and management service by telephone and the availability of in person appointments. I also discussed with the patient that there may be a patient responsible charge related to this service. The patient expressed understanding and agreed to proceed.   History and Present Illness:   Chief Complaint: Hypertension   HPI Patient calls in c/o elevated blood pressure. Started going up over the weekend. [facility] has system set up and she checks her blood pressure with them daily. Her blood pressure has been running 170's over 80's. Sh ehas atrial fib and is on diltiazem and metoprolol.   Review of Systems  Constitutional: Negative for diaphoresis and weight loss.  Eyes: Negative for blurred vision, double vision and pain.  Respiratory: Negative for shortness of breath.   Cardiovascular: Negative for chest pain, palpitations, orthopnea and leg swelling.  Gastrointestinal: Negative for abdominal pain.  Skin: Negative for rash.  Neurological: Negative for dizziness, sensory change, loss of consciousness, weakness and headaches.  Endo/Heme/Allergies: Negative for polydipsia. Does not bruise/bleed easily.  Psychiatric/Behavioral: Negative for memory loss. The patient does not have insomnia.   All other systems reviewed and are negative.    Observations/Objective: Alert and oriented No distress  Assessment and Plan: [name] in today with chief complaint of Hypertension   1. Essential hypertension Creatine was normal Low sodium  diet Keep diary of blood pressure and call if not improving - losartan (COZAAR) 100 MG tablet; Take 1 tablet (100 mg total) by mouth daily.  Dispense: 90 tablet; Refill: 3   Follow Up Instructions: 2-3 weeks    I discussed the assessment and treatment plan with the patient. The patient was provided an opportunity to ask questions and all were answered. The patient agreed with the plan and demonstrated an understanding of the instructions.   The patient was advised to call back or seek an in-person evaluation if the symptoms worsen or if the condition fails to improve as anticipated.  The above assessment and management plan was discussed with the patient. The patient verbalized understanding of and has agreed to the management plan. Patient is aware to call the clinic if symptoms persist or worsen. Patient is aware when to return to the clinic for a follow-up visit. Patient educated on when it is appropriate to go to the emergency department.   Time call ended:  5:10  I provided 15 minutes of non-face-to-face time during this encounter.    Mary-Margaret Hassell Done, FNP

## 2019-05-07 ENCOUNTER — Other Ambulatory Visit: Payer: Self-pay | Admitting: Nurse Practitioner

## 2019-05-07 ENCOUNTER — Other Ambulatory Visit: Payer: Self-pay | Admitting: Cardiology

## 2019-05-07 ENCOUNTER — Other Ambulatory Visit: Payer: Self-pay | Admitting: Family Medicine

## 2019-05-07 ENCOUNTER — Telehealth: Payer: Self-pay | Admitting: Cardiology

## 2019-05-07 NOTE — Telephone Encounter (Signed)
Agree with above.  She should keep a BP diary and let us know how this med works.

## 2019-05-07 NOTE — Telephone Encounter (Signed)
Last office visit 05/05/2019

## 2019-05-07 NOTE — Telephone Encounter (Signed)
Pt c/o BP issue: STAT if pt c/o blurred vision, one-sided weakness or slurred speech  1. What are your last 5 BP readings?       195/90 yesterday morning, an hr later 165/80      152/80 this morning, 152/66 about an hour later  2. Are you having any other symptoms (ex. Dizziness, headache, blurred vision, passed out)? Some lightheadedness, no energy.  On Monday she had pain on left side of neck, and headache.   3. What is your BP issue? Can't seem to get it under control.

## 2019-05-07 NOTE — Telephone Encounter (Signed)
Spoke with pt who report her BP has been WNL until this week. She report Monday is when she noticed the increase and she report pain in her left neck that has since subsided and occasional headaches. She report yesterday her BP was 195/90 and a hr later 165/80. She consulted with her PCP who added Cozaar 100 mg daily on 5/20. Pt state she has taken to doses, yesterday and today. She report today BP is the lowest it's been all week. 152/80 and 152/66. Pt advised to give new medication a few weeks to take effect and to continue to monitor BP daily. Will route to MD for any further recommendations.

## 2019-05-07 NOTE — Telephone Encounter (Signed)
Pt updated and voice understanding.

## 2019-05-13 DIAGNOSIS — T85192A Other mechanical complication of implanted electronic neurostimulator (electrode) of spinal cord, initial encounter: Secondary | ICD-10-CM | POA: Diagnosis not present

## 2019-05-14 DIAGNOSIS — I10 Essential (primary) hypertension: Secondary | ICD-10-CM | POA: Diagnosis not present

## 2019-05-14 DIAGNOSIS — I509 Heart failure, unspecified: Secondary | ICD-10-CM | POA: Diagnosis not present

## 2019-05-14 DIAGNOSIS — G894 Chronic pain syndrome: Secondary | ICD-10-CM | POA: Diagnosis not present

## 2019-05-26 ENCOUNTER — Encounter: Payer: Self-pay | Admitting: *Deleted

## 2019-06-04 ENCOUNTER — Other Ambulatory Visit: Payer: Self-pay | Admitting: Nurse Practitioner

## 2019-06-04 ENCOUNTER — Other Ambulatory Visit: Payer: Self-pay | Admitting: Family Medicine

## 2019-06-07 ENCOUNTER — Other Ambulatory Visit: Payer: Self-pay | Admitting: Cardiology

## 2019-06-14 DIAGNOSIS — G894 Chronic pain syndrome: Secondary | ICD-10-CM | POA: Diagnosis not present

## 2019-06-14 DIAGNOSIS — I10 Essential (primary) hypertension: Secondary | ICD-10-CM | POA: Diagnosis not present

## 2019-06-14 DIAGNOSIS — I509 Heart failure, unspecified: Secondary | ICD-10-CM | POA: Diagnosis not present

## 2019-06-15 DIAGNOSIS — C8580 Other specified types of non-Hodgkin lymphoma, unspecified site: Secondary | ICD-10-CM | POA: Diagnosis not present

## 2019-06-15 DIAGNOSIS — G9619 Other disorders of meninges, not elsewhere classified: Secondary | ICD-10-CM | POA: Diagnosis not present

## 2019-06-15 DIAGNOSIS — G950 Syringomyelia and syringobulbia: Secondary | ICD-10-CM | POA: Diagnosis not present

## 2019-06-15 DIAGNOSIS — M4800 Spinal stenosis, site unspecified: Secondary | ICD-10-CM | POA: Diagnosis not present

## 2019-06-15 DIAGNOSIS — C8590 Non-Hodgkin lymphoma, unspecified, unspecified site: Secondary | ICD-10-CM | POA: Diagnosis not present

## 2019-06-15 DIAGNOSIS — G9520 Unspecified cord compression: Secondary | ICD-10-CM | POA: Diagnosis not present

## 2019-07-01 ENCOUNTER — Other Ambulatory Visit: Payer: Self-pay | Admitting: *Deleted

## 2019-07-02 ENCOUNTER — Telehealth: Payer: Self-pay | Admitting: Cardiology

## 2019-07-02 MED ORDER — ALPRAZOLAM 0.5 MG PO TABS: 1.0000 mg | ORAL_TABLET | Freq: Every day | ORAL | 0 refills | Status: DC

## 2019-07-02 NOTE — Telephone Encounter (Signed)
OK to renew one month

## 2019-07-02 NOTE — Telephone Encounter (Signed)
Spoke with pt, refill called to the pharmacy.

## 2019-07-02 NOTE — Telephone Encounter (Signed)
Spoke with pt, aware this will need to come from dr Laurance Flatten. She reports dr Laurance Flatten has retired and she is not able to see the new doctor in [facility] because she is not able to walk and is awaiting back surgery. She would like a refill for just one month to get her through until after surgery then she will see the new medical doctor.

## 2019-07-02 NOTE — Telephone Encounter (Signed)
  Patient is asking if Dr Percival Spanish would be willing to call a refill for one month in for her Xanax. Dr Laurance Flatten did handle her Xanax but her retired and she will have to see a new dr to get it filled. Right now she is waiting to have back surgery and cannot get out to see a dr. If he is willing she uses [facility] and takes ALPRAZolam (XANAX) 0.5 MG tablet. Please let her know.

## 2019-07-02 NOTE — Telephone Encounter (Signed)
Patient given sooner appointment.

## 2019-07-02 NOTE — Telephone Encounter (Signed)
I legally cannot prescribe without appt.  She will have to see me earlier if she needs this now.  We need to discuss.

## 2019-07-05 ENCOUNTER — Ambulatory Visit: Payer: Medicare Other | Admitting: Family Medicine

## 2019-07-08 DIAGNOSIS — Z1159 Encounter for screening for other viral diseases: Secondary | ICD-10-CM | POA: Diagnosis not present

## 2019-07-08 DIAGNOSIS — G9619 Other disorders of meninges, not elsewhere classified: Secondary | ICD-10-CM | POA: Diagnosis not present

## 2019-07-08 DIAGNOSIS — Z01812 Encounter for preprocedural laboratory examination: Secondary | ICD-10-CM | POA: Diagnosis not present

## 2019-07-09 DIAGNOSIS — M4854XA Collapsed vertebra, not elsewhere classified, thoracic region, initial encounter for fracture: Secondary | ICD-10-CM | POA: Diagnosis not present

## 2019-07-09 DIAGNOSIS — Z01818 Encounter for other preprocedural examination: Secondary | ICD-10-CM | POA: Diagnosis not present

## 2019-07-09 DIAGNOSIS — Z96659 Presence of unspecified artificial knee joint: Secondary | ICD-10-CM | POA: Diagnosis not present

## 2019-07-09 DIAGNOSIS — R3915 Urgency of urination: Secondary | ICD-10-CM | POA: Diagnosis not present

## 2019-07-09 DIAGNOSIS — I4891 Unspecified atrial fibrillation: Secondary | ICD-10-CM | POA: Diagnosis not present

## 2019-07-09 DIAGNOSIS — I110 Hypertensive heart disease with heart failure: Secondary | ICD-10-CM | POA: Diagnosis not present

## 2019-07-09 DIAGNOSIS — G9619 Other disorders of meninges, not elsewhere classified: Secondary | ICD-10-CM | POA: Diagnosis not present

## 2019-07-09 DIAGNOSIS — Z8669 Personal history of other diseases of the nervous system and sense organs: Secondary | ICD-10-CM | POA: Diagnosis not present

## 2019-07-09 DIAGNOSIS — E871 Hypo-osmolality and hyponatremia: Secondary | ICD-10-CM | POA: Diagnosis not present

## 2019-07-09 DIAGNOSIS — Z1159 Encounter for screening for other viral diseases: Secondary | ICD-10-CM | POA: Diagnosis not present

## 2019-07-09 DIAGNOSIS — Z7901 Long term (current) use of anticoagulants: Secondary | ICD-10-CM | POA: Diagnosis not present

## 2019-07-09 DIAGNOSIS — J449 Chronic obstructive pulmonary disease, unspecified: Secondary | ICD-10-CM | POA: Diagnosis not present

## 2019-07-09 DIAGNOSIS — C8590 Non-Hodgkin lymphoma, unspecified, unspecified site: Secondary | ICD-10-CM | POA: Diagnosis not present

## 2019-07-09 DIAGNOSIS — I2510 Atherosclerotic heart disease of native coronary artery without angina pectoris: Secondary | ICD-10-CM | POA: Diagnosis not present

## 2019-07-09 DIAGNOSIS — G9529 Other cord compression: Secondary | ICD-10-CM | POA: Diagnosis not present

## 2019-07-09 DIAGNOSIS — I440 Atrioventricular block, first degree: Secondary | ICD-10-CM | POA: Diagnosis not present

## 2019-07-09 DIAGNOSIS — I509 Heart failure, unspecified: Secondary | ICD-10-CM | POA: Diagnosis not present

## 2019-07-09 DIAGNOSIS — K219 Gastro-esophageal reflux disease without esophagitis: Secondary | ICD-10-CM | POA: Diagnosis not present

## 2019-07-12 ENCOUNTER — Telehealth: Payer: Self-pay | Admitting: Family Medicine

## 2019-07-14 DIAGNOSIS — Z4789 Encounter for other orthopedic aftercare: Secondary | ICD-10-CM | POA: Diagnosis not present

## 2019-07-14 DIAGNOSIS — R3915 Urgency of urination: Secondary | ICD-10-CM | POA: Diagnosis not present

## 2019-07-14 DIAGNOSIS — G9529 Other cord compression: Secondary | ICD-10-CM | POA: Diagnosis not present

## 2019-07-14 DIAGNOSIS — Z8669 Personal history of other diseases of the nervous system and sense organs: Secondary | ICD-10-CM | POA: Diagnosis not present

## 2019-07-14 DIAGNOSIS — E785 Hyperlipidemia, unspecified: Secondary | ICD-10-CM | POA: Diagnosis not present

## 2019-07-14 DIAGNOSIS — J810 Acute pulmonary edema: Secondary | ICD-10-CM | POA: Diagnosis not present

## 2019-07-14 DIAGNOSIS — R5381 Other malaise: Secondary | ICD-10-CM | POA: Diagnosis not present

## 2019-07-14 DIAGNOSIS — Z1159 Encounter for screening for other viral diseases: Secondary | ICD-10-CM | POA: Diagnosis not present

## 2019-07-14 DIAGNOSIS — M549 Dorsalgia, unspecified: Secondary | ICD-10-CM | POA: Diagnosis not present

## 2019-07-14 DIAGNOSIS — G930 Cerebral cysts: Secondary | ICD-10-CM | POA: Diagnosis not present

## 2019-07-14 DIAGNOSIS — M899 Disorder of bone, unspecified: Secondary | ICD-10-CM | POA: Diagnosis not present

## 2019-07-14 DIAGNOSIS — G8929 Other chronic pain: Secondary | ICD-10-CM | POA: Diagnosis not present

## 2019-07-14 DIAGNOSIS — G9619 Other disorders of meninges, not elsewhere classified: Secondary | ICD-10-CM | POA: Diagnosis not present

## 2019-07-14 DIAGNOSIS — Z982 Presence of cerebrospinal fluid drainage device: Secondary | ICD-10-CM | POA: Diagnosis not present

## 2019-07-14 DIAGNOSIS — Z741 Need for assistance with personal care: Secondary | ICD-10-CM | POA: Diagnosis not present

## 2019-07-14 DIAGNOSIS — R262 Difficulty in walking, not elsewhere classified: Secondary | ICD-10-CM | POA: Diagnosis not present

## 2019-07-14 DIAGNOSIS — I10 Essential (primary) hypertension: Secondary | ICD-10-CM | POA: Diagnosis not present

## 2019-07-14 DIAGNOSIS — C8590 Non-Hodgkin lymphoma, unspecified, unspecified site: Secondary | ICD-10-CM | POA: Diagnosis not present

## 2019-07-14 DIAGNOSIS — I4891 Unspecified atrial fibrillation: Secondary | ICD-10-CM | POA: Diagnosis not present

## 2019-07-14 DIAGNOSIS — M546 Pain in thoracic spine: Secondary | ICD-10-CM | POA: Diagnosis not present

## 2019-07-14 DIAGNOSIS — M4854XA Collapsed vertebra, not elsewhere classified, thoracic region, initial encounter for fracture: Secondary | ICD-10-CM | POA: Diagnosis not present

## 2019-07-14 DIAGNOSIS — Z7901 Long term (current) use of anticoagulants: Secondary | ICD-10-CM | POA: Diagnosis not present

## 2019-07-14 DIAGNOSIS — I110 Hypertensive heart disease with heart failure: Secondary | ICD-10-CM | POA: Diagnosis not present

## 2019-07-14 DIAGNOSIS — I440 Atrioventricular block, first degree: Secondary | ICD-10-CM | POA: Diagnosis not present

## 2019-07-14 DIAGNOSIS — R279 Unspecified lack of coordination: Secondary | ICD-10-CM | POA: Diagnosis not present

## 2019-07-14 DIAGNOSIS — I5032 Chronic diastolic (congestive) heart failure: Secondary | ICD-10-CM | POA: Diagnosis not present

## 2019-07-14 DIAGNOSIS — Z96659 Presence of unspecified artificial knee joint: Secondary | ICD-10-CM | POA: Diagnosis not present

## 2019-07-14 DIAGNOSIS — E871 Hypo-osmolality and hyponatremia: Secondary | ICD-10-CM | POA: Diagnosis not present

## 2019-07-14 DIAGNOSIS — I480 Paroxysmal atrial fibrillation: Secondary | ICD-10-CM | POA: Diagnosis not present

## 2019-07-14 DIAGNOSIS — I509 Heart failure, unspecified: Secondary | ICD-10-CM | POA: Diagnosis not present

## 2019-07-14 DIAGNOSIS — M6281 Muscle weakness (generalized): Secondary | ICD-10-CM | POA: Diagnosis not present

## 2019-07-14 DIAGNOSIS — C8593 Non-Hodgkin lymphoma, unspecified, intra-abdominal lymph nodes: Secondary | ICD-10-CM | POA: Diagnosis not present

## 2019-07-14 DIAGNOSIS — J90 Pleural effusion, not elsewhere classified: Secondary | ICD-10-CM | POA: Diagnosis not present

## 2019-07-14 DIAGNOSIS — R918 Other nonspecific abnormal finding of lung field: Secondary | ICD-10-CM | POA: Diagnosis not present

## 2019-07-14 DIAGNOSIS — Z743 Need for continuous supervision: Secondary | ICD-10-CM | POA: Diagnosis not present

## 2019-07-14 DIAGNOSIS — I2510 Atherosclerotic heart disease of native coronary artery without angina pectoris: Secondary | ICD-10-CM | POA: Diagnosis not present

## 2019-07-14 DIAGNOSIS — K219 Gastro-esophageal reflux disease without esophagitis: Secondary | ICD-10-CM | POA: Diagnosis not present

## 2019-07-14 DIAGNOSIS — M5185 Other intervertebral disc disorders, thoracolumbar region: Secondary | ICD-10-CM | POA: Diagnosis not present

## 2019-07-14 DIAGNOSIS — J449 Chronic obstructive pulmonary disease, unspecified: Secondary | ICD-10-CM | POA: Diagnosis not present

## 2019-07-19 ENCOUNTER — Encounter: Payer: Self-pay | Admitting: Family Medicine

## 2019-07-19 ENCOUNTER — Ambulatory Visit: Payer: Medicare Other | Admitting: Family Medicine

## 2019-07-20 ENCOUNTER — Encounter: Payer: Self-pay | Admitting: Family Medicine

## 2019-07-21 ENCOUNTER — Inpatient Hospital Stay
Admission: RE | Admit: 2019-07-21 | Discharge: 2019-07-26 | Disposition: A | Discharge status: Home / Self Care | Payer: Medicare Other | Source: Ambulatory Visit | Attending: Internal Medicine | Admitting: Internal Medicine

## 2019-07-21 ENCOUNTER — Non-Acute Institutional Stay (SKILLED_NURSING_FACILITY): Payer: Medicare Other | Admitting: Adult Health

## 2019-07-21 ENCOUNTER — Encounter: Payer: Self-pay | Admitting: Adult Health

## 2019-07-21 DIAGNOSIS — E782 Mixed hyperlipidemia: Secondary | ICD-10-CM

## 2019-07-21 DIAGNOSIS — M5185 Other intervertebral disc disorders, thoracolumbar region: Secondary | ICD-10-CM | POA: Diagnosis not present

## 2019-07-21 DIAGNOSIS — M549 Dorsalgia, unspecified: Secondary | ICD-10-CM | POA: Diagnosis not present

## 2019-07-21 DIAGNOSIS — K219 Gastro-esophageal reflux disease without esophagitis: Secondary | ICD-10-CM | POA: Diagnosis not present

## 2019-07-21 DIAGNOSIS — I480 Paroxysmal atrial fibrillation: Secondary | ICD-10-CM | POA: Diagnosis not present

## 2019-07-21 DIAGNOSIS — E039 Hypothyroidism, unspecified: Secondary | ICD-10-CM

## 2019-07-21 DIAGNOSIS — G8929 Other chronic pain: Secondary | ICD-10-CM | POA: Diagnosis not present

## 2019-07-21 DIAGNOSIS — C8593 Non-Hodgkin lymphoma, unspecified, intra-abdominal lymph nodes: Secondary | ICD-10-CM | POA: Diagnosis not present

## 2019-07-21 DIAGNOSIS — R300 Dysuria: Secondary | ICD-10-CM | POA: Diagnosis not present

## 2019-07-21 DIAGNOSIS — Z743 Need for continuous supervision: Secondary | ICD-10-CM | POA: Diagnosis not present

## 2019-07-21 DIAGNOSIS — F329 Major depressive disorder, single episode, unspecified: Secondary | ICD-10-CM

## 2019-07-21 DIAGNOSIS — Z4789 Encounter for other orthopedic aftercare: Secondary | ICD-10-CM | POA: Diagnosis not present

## 2019-07-21 DIAGNOSIS — R262 Difficulty in walking, not elsewhere classified: Secondary | ICD-10-CM | POA: Diagnosis not present

## 2019-07-21 DIAGNOSIS — I110 Hypertensive heart disease with heart failure: Secondary | ICD-10-CM

## 2019-07-21 DIAGNOSIS — M899 Disorder of bone, unspecified: Secondary | ICD-10-CM | POA: Diagnosis not present

## 2019-07-21 DIAGNOSIS — R339 Retention of urine, unspecified: Secondary | ICD-10-CM

## 2019-07-21 DIAGNOSIS — I5032 Chronic diastolic (congestive) heart failure: Secondary | ICD-10-CM

## 2019-07-21 DIAGNOSIS — G930 Cerebral cysts: Secondary | ICD-10-CM | POA: Diagnosis not present

## 2019-07-21 DIAGNOSIS — G894 Chronic pain syndrome: Secondary | ICD-10-CM | POA: Diagnosis not present

## 2019-07-21 DIAGNOSIS — R279 Unspecified lack of coordination: Secondary | ICD-10-CM | POA: Diagnosis not present

## 2019-07-21 DIAGNOSIS — I10 Essential (primary) hypertension: Secondary | ICD-10-CM | POA: Diagnosis not present

## 2019-07-21 DIAGNOSIS — R32 Unspecified urinary incontinence: Secondary | ICD-10-CM | POA: Diagnosis present

## 2019-07-21 DIAGNOSIS — M546 Pain in thoracic spine: Secondary | ICD-10-CM | POA: Diagnosis not present

## 2019-07-21 DIAGNOSIS — G9619 Other disorders of meninges, not elsewhere classified: Secondary | ICD-10-CM

## 2019-07-21 DIAGNOSIS — Z7901 Long term (current) use of anticoagulants: Secondary | ICD-10-CM | POA: Diagnosis not present

## 2019-07-21 DIAGNOSIS — E785 Hyperlipidemia, unspecified: Secondary | ICD-10-CM | POA: Diagnosis not present

## 2019-07-21 DIAGNOSIS — J449 Chronic obstructive pulmonary disease, unspecified: Secondary | ICD-10-CM | POA: Diagnosis not present

## 2019-07-21 DIAGNOSIS — M6281 Muscle weakness (generalized): Secondary | ICD-10-CM | POA: Diagnosis not present

## 2019-07-21 DIAGNOSIS — M810 Age-related osteoporosis without current pathological fracture: Secondary | ICD-10-CM

## 2019-07-21 DIAGNOSIS — Z741 Need for assistance with personal care: Secondary | ICD-10-CM | POA: Diagnosis not present

## 2019-07-21 DIAGNOSIS — E871 Hypo-osmolality and hyponatremia: Secondary | ICD-10-CM

## 2019-07-21 DIAGNOSIS — K5909 Other constipation: Secondary | ICD-10-CM

## 2019-07-21 DIAGNOSIS — R5381 Other malaise: Secondary | ICD-10-CM | POA: Diagnosis not present

## 2019-07-21 DIAGNOSIS — G96198 Other disorders of meninges, not elsewhere classified: Secondary | ICD-10-CM

## 2019-07-21 DIAGNOSIS — I25118 Atherosclerotic heart disease of native coronary artery with other forms of angina pectoris: Secondary | ICD-10-CM

## 2019-07-21 DIAGNOSIS — R3989 Other symptoms and signs involving the genitourinary system: Secondary | ICD-10-CM | POA: Diagnosis present

## 2019-07-21 MED ORDER — METHOCARBAMOL 500 MG PO TABS
500.00 | ORAL_TABLET | ORAL | Status: DC
Start: 2019-07-21 — End: 2019-07-21

## 2019-07-21 MED ORDER — ALBUTEROL SULFATE (2.5 MG/3ML) 0.083% IN NEBU
2.50 | INHALATION_SOLUTION | RESPIRATORY_TRACT | Status: DC
Start: ? — End: 2019-07-21

## 2019-07-21 MED ORDER — METOPROLOL TARTRATE 25 MG PO TABS
25.00 | ORAL_TABLET | ORAL | Status: DC
Start: 2019-07-21 — End: 2019-07-21

## 2019-07-21 MED ORDER — MONTELUKAST SODIUM 10 MG PO TABS
10.00 | ORAL_TABLET | ORAL | Status: DC
Start: 2019-07-22 — End: 2019-07-21

## 2019-07-21 MED ORDER — ALPRAZOLAM 0.5 MG PO TABS
.50 | ORAL_TABLET | ORAL | Status: DC
Start: ? — End: 2019-07-21

## 2019-07-21 MED ORDER — HYDRALAZINE HCL 20 MG/ML IJ SOLN
5.00 | INTRAMUSCULAR | Status: DC
Start: ? — End: 2019-07-21

## 2019-07-21 MED ORDER — HYDROCODONE-ACETAMINOPHEN 5-325 MG PO TABS
1.00 | ORAL_TABLET | ORAL | Status: DC
Start: ? — End: 2019-07-21

## 2019-07-21 MED ORDER — OMEPRAZOLE-SODIUM BICARBONATE 40-1100 MG PO CAPS
1.00 | ORAL_CAPSULE | ORAL | Status: DC
Start: 2019-07-21 — End: 2019-07-21

## 2019-07-21 MED ORDER — GENERIC EXTERNAL MEDICATION
Status: DC
Start: ? — End: 2019-07-21

## 2019-07-21 MED ORDER — ONDANSETRON HCL 4 MG PO TABS
4.00 | ORAL_TABLET | ORAL | Status: DC
Start: ? — End: 2019-07-21

## 2019-07-21 MED ORDER — MECLIZINE HCL 25 MG PO TABS
25.00 | ORAL_TABLET | ORAL | Status: DC
Start: ? — End: 2019-07-21

## 2019-07-21 MED ORDER — LEVOTHYROXINE SODIUM 25 MCG PO TABS
25.00 | ORAL_TABLET | ORAL | Status: DC
Start: 2019-07-22 — End: 2019-07-21

## 2019-07-21 MED ORDER — METOPROLOL TARTRATE 25 MG PO TABS
50.00 | ORAL_TABLET | ORAL | Status: DC
Start: 2019-07-22 — End: 2019-07-21

## 2019-07-21 MED ORDER — GENERIC EXTERNAL MEDICATION
500.00 | Status: DC
Start: ? — End: 2019-07-21

## 2019-07-21 MED ORDER — ONDANSETRON HCL 4 MG/2ML IJ SOLN
4.00 | INTRAMUSCULAR | Status: DC
Start: ? — End: 2019-07-21

## 2019-07-21 MED ORDER — SODIUM CHLORIDE 1 G PO TABS
2000.00 | ORAL_TABLET | ORAL | Status: DC
Start: 2019-07-21 — End: 2019-07-21

## 2019-07-21 MED ORDER — FLUTICASONE PROPIONATE 50 MCG/ACT NA SUSP
1.00 | NASAL | Status: DC
Start: 2019-07-22 — End: 2019-07-21

## 2019-07-21 MED ORDER — SERTRALINE HCL 100 MG PO TABS
100.00 | ORAL_TABLET | ORAL | Status: DC
Start: 2019-07-22 — End: 2019-07-21

## 2019-07-21 MED ORDER — BUDESONIDE 0.5 MG/2ML IN SUSP
.50 | RESPIRATORY_TRACT | Status: DC
Start: ? — End: 2019-07-21

## 2019-07-21 MED ORDER — POLYETHYLENE GLYCOL 3350 17 G PO PACK
17.00 | PACK | ORAL | Status: DC
Start: 2019-07-22 — End: 2019-07-21

## 2019-07-21 MED ORDER — HYDROCODONE-ACETAMINOPHEN 5-325 MG PO TABS
2.00 | ORAL_TABLET | ORAL | Status: DC
Start: ? — End: 2019-07-21

## 2019-07-21 MED ORDER — TAMSULOSIN HCL 0.4 MG PO CAPS
.40 | ORAL_CAPSULE | ORAL | Status: DC
Start: 2019-07-22 — End: 2019-07-21

## 2019-07-21 MED ORDER — DICYCLOMINE HCL 10 MG PO CAPS
10.00 | ORAL_CAPSULE | ORAL | Status: DC
Start: ? — End: 2019-07-21

## 2019-07-21 MED ORDER — DILTIAZEM HCL ER COATED BEADS 120 MG PO CP24
120.00 | ORAL_CAPSULE | ORAL | Status: DC
Start: 2019-07-22 — End: 2019-07-21

## 2019-07-21 MED ORDER — MELATONIN 3 MG PO TABS
6.00 | ORAL_TABLET | ORAL | Status: DC
Start: ? — End: 2019-07-21

## 2019-07-21 MED ORDER — ROSUVASTATIN CALCIUM 20 MG PO TABS
20.00 | ORAL_TABLET | ORAL | Status: DC
Start: 2019-07-22 — End: 2019-07-21

## 2019-07-21 NOTE — Progress Notes (Signed)
Location:   [facility] Room Number: 159 P Place of Service:  SNF (31)   CODE STATUS: Full Code  Allergies  Allergen Reactions  . Naproxen Other (See Comments)    Tongue swelling  . Penicillins Swelling    Swelling around site Has patient had a PCN reaction causing immediate rash, facial/tongue/throat swelling, SOB or lightheadedness with hypotension: Yes Has patient had a PCN reaction causing severe rash involving mucus membranes or skin necrosis: No Has patient had a PCN reaction that required hospitalization: No Has patient had a PCN reaction occurring within the last 10 years: No If all of the above answers are "NO", then may proceed with Cephalosporin use.   . Sulfonamide Derivatives Hives  . Aspirin Other (See Comments)    REACTION: regular strength causes "heart to beat fast"  . Captopril Hypertension  . Strawberry Extract Other (See Comments)  . Clindamycin/Lincomycin Other (See Comments)    unknown    Chief Complaint  Patient presents with  . Hospitalization Follow-up    Hispital follow up from [facility]    HPI:  She is a 83 year old woman who has been hospitalized following a thoracic laminectomy for drainage shunting of arachnoid cyst on 07-14-19. She has had numerous back surgeries. She has a history of a spinal cord stimulator with failure in Feb of this year. She has been diagnosed with a low grade lymphoma in May of this year. She is being followed by hematology with watchful monitoring.  She has a history of spinal cysts in the past.  She is here for short term rehab with her goal to return home. She has chronic back pain; which is being controlled; no changes in her appetite; no insomnia. She will continue to be followed for her chronic illnesses including: cad; copd; afib.   Past Medical History:  Diagnosis Date  . Anxiety   . Arthritis   . Asthmatic bronchitis   . Atrial fibrillation ([place])   . Bowel obstruction ([facility])    blockage  . CAD (coronary artery disease)    Stent to RI 2003.  Myoview 2013 no ischemia.  . Cataract   . Chronic back pain   . Chronic bronchitis ([place])   . Colon polyp    adenomatous  . Congestive heart disease ([facility])    Preserved EF  . Depression   . Esophageal motility disorder   . Fibromyalgia   . GERD (gastroesophageal reflux disease)   . History of hiatal hernia   . History of kidney stones   . Hyperlipidemia   . Hypertension   . Hypothyroid   . 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 Nephrolithiasis   . OA (osteoarthritis)   . Status post dilation of esophageal narrowing     Past Surgical History:  Procedure Laterality Date  . ABDOMINAL HYSTERECTOMY     partial  . ANKLE FUSION  08/27/2012   Procedure: ARTHRODESIS ANKLE;  Surgeon: Wylene Simmer, MD;  Location: [facility];  Service: Orthopedics;  Laterality: Right;  Arthrodesis right ankle and subtalar joint  . APPENDECTOMY    . ARTHRODESIS TIBIOFIBULAR Right 03/31/2014   Procedure: REVISION OF SUBTALOR ARTHRODESIS  RIGHT ;  Surgeon: Wylene Simmer, MD;  Location: [place];  Service: Orthopedics;  Laterality: Right;  . BACK SURGERY    . BREAST SURGERY     Left breast lump removed  . BRONCHOSCOPY    . CORONARY ANGIOPLASTY WITH STENT PLACEMENT  2003  . DECORTICATION Right 05/03/2014   Procedure: DECORTICATION;  Surgeon: [name], MD;  Location: [place];  Service: Thoracic;  Laterality: Right;  . EYE SURGERY  2016   cateracts  . FOOT ARTHRODESIS, SUBTALAR Right 2013  . HARDWARE REMOVAL Right 03/31/2014   Procedure: REMOVAL OF DEEP IMPLANTS X 3  RIGHT ;  Surgeon: Wylene Simmer, MD;  Location: [facility];  Service: Orthopedics;  Laterality: Right;  . HARDWARE REMOVAL Right 11/03/2014   Procedure: HARDWARE REMOVAL OF SUBTALAR JOINT;  Surgeon: Wylene Simmer, MD;  Location: [facility];  Service: Orthopedics;  Laterality: Right;  . HARDWARE REVISION  03/31/2014   SUBTALOR  ARTHRODESIS       DR HEWITT   . JOINT REPLACEMENT     Right knee  . KNEE ARTHROSCOPY  03/26/2012   Procedure: ARTHROSCOPY KNEE;  Surgeon: Wylene Simmer, MD;  Location: [facility] [place];  Service: Orthopedics;  Laterality: Left;  with Debridement of Lateral Meniscus tear  . REMOVAL OF IMPLANT Right 03/31/2014   DR HEWITT  . ROTATOR CUFF REPAIR Bilateral   . SINUS SURGERY WITH INSTATRAK    . SPINAL CORD STIMULATOR INSERTION N/A 07/02/2017   Procedure: LUMBAR SPINAL CORD STIMULATOR INSERTION;  Surgeon: Melina Schools, MD;  Location: [facility];  Service: Orthopedics;  Laterality: N/A;  120 mins  . TOTAL KNEE ARTHROPLASTY Right   . VIDEO ASSISTED THORACOSCOPY (VATS)/EMPYEMA Right 05/03/2014   Procedure: VIDEO ASSISTED THORACOSCOPY (VATS)/EMPYEMA;  Surgeon: [name], MD;  Location: [facility];  Service: Thoracic;  Laterality: Right;  Marland Kitchen VIDEO BRONCHOSCOPY N/A 05/03/2014   Procedure: VIDEO BRONCHOSCOPY;  Surgeon: [name], MD;  Location: [facility] OR;  Service: Thoracic;  Laterality: N/A;    Social History   Socioeconomic History  . Marital status: Widowed    Spouse name: Not on file  . Number of children: 2  . Years of education: Not on file  . Highest education level: Not on file  Occupational History  . Occupation: Retired  Scientific laboratory technician  . Financial resource strain: Not on file  . Food insecurity    Worry: Not on file    Inability: Not on file  . Transportation needs    Medical: Not on file    Non-medical: Not on file  Tobacco Use  . Smoking status: Former Smoker    Packs/day: 1.00    Years: 30.00    Pack years: 30.00    Types: Cigarettes    Quit date: 12/16/1989    Years since quitting: 29.6  . Smokeless tobacco: Never Used  . Tobacco comment: smoked off & on  Substance and Sexual Activity  . Alcohol use: No    Alcohol/week: 0.0 standard drinks  . Drug use: No  . Sexual activity: Not Currently  Lifestyle  . Physical activity    Days per week: Not on file    Minutes per session: Not on file  . Stress: Not on file   Relationships  . Social Herbalist on phone: Not on file    Gets together: Not on file    Attends religious service: Not on file    Active member of club or organization: Not on file    Attends meetings of clubs or organizations: Not on file    Relationship status: Not on file  . Intimate partner violence    Fear of current or ex partner: Not on file    Emotionally abused: Not on file  Physically abused: Not on file    Forced sexual activity: Not on file  Other Topics Concern  . Not on file  Social History Narrative   Originally from [place]. Always lived in [place]. No international travel. Has prior travel to [place], [place], [place], [place], [place], & [place]. No recent travel. She has a cat currently. No prior bird, mold, or hot tub exposure. 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 No known asbestos exposure. 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    Family History  Problem Relation Age of Onset  . Prostate cancer Brother   . Cancer Brother        PROSTATE  . Heart disease Mother   . Asthma Mother   . Congestive Heart Failure Mother   . Emphysema Sister   . COPD Sister   . Stroke Sister   . Heart disease Sister   . Emphysema Brother   . Rheumatologic disease Neg Hx       VITAL SIGNS BP (!) 166/86   Pulse 69   Temp 97.6 F (36.4 C)   Resp 12   Ht 5' (1.524 m)   Wt 149 lb (67.6 kg)   SpO2 93%   BMI 29.10 kg/m   Outpatient Encounter Medications as of 07/21/2019  Medication Sig  . albuterol (VENTOLIN HFA) 108 (90 Base) MCG/ACT inhaler Inhale 2 puffs into the lungs every 4 (four) hours as needed for wheezing or shortness of breath.  . ALPRAZolam (XANAX) 1 MG tablet Take 1 mg by mouth 2 (two) times daily as needed for anxiety.  . Ascorbic Acid (VITAMIN C) 1000 MG tablet Take 1,000 mg by mouth daily.  . beta carotene 25000 UNIT capsule Take 25,000 Units by mouth daily.  . calcium carbonate (TUMS - DOSED  IN MG ELEMENTAL CALCIUM) 500 MG chewable tablet Chew 1 tablet by mouth 3 (three) times daily as needed for indigestion or heartburn.  . Calcium Carbonate-Vitamin D (CALCIUM 600+D) 600-400 MG-UNIT per tablet Take 1 tablet by mouth 2 (two) times daily.   Marland Kitchen denosumab (PROLIA) 60 MG/ML SOSY injection Inject 60 mg every 6 (six) months into the skin. Administer in upper arm, thigh, or abdomen  . dicyclomine (BENTYL) 10 MG capsule Take 10 mg by mouth 4 (four) times daily as needed for spasms.  Marland Kitchen diltiazem (CARDIZEM) 120 MG tablet Take 120 mg by mouth daily.  Marland Kitchen HYDROcodone-acetaminophen (NORCO/VICODIN) 5-325 MG tablet Take 1-2 tablets by mouth every 4 (four) hours as needed for moderate pain.  Marland Kitchen levothyroxine (SYNTHROID) 25 MCG tablet Take 25 mcg by mouth daily before breakfast.  . loratadine (CLARITIN) 10 MG tablet Take 1 tablet (10 mg total) by mouth daily.  . Melatonin 5 MG TABS Take 10 mg by mouth every evening.  . methocarbamol (ROBAXIN) 500 MG tablet Take 500 mg by mouth 4 (four) times daily as needed for muscle spasms.  . metoprolol tartrate (LOPRESSOR) 25 MG tablet Take 2 tablets 950 mg) by mouth once daily at 8:00 am  . metoprolol tartrate (LOPRESSOR) 25 MG tablet Take 25 mg by mouth every evening.  . montelukast (SINGULAIR) 10 MG tablet Take 10 mg by mouth at bedtime.  . Multiple Vitamin (MULTIVITAMIN) tablet Take 1 tablet by mouth daily.  . NON FORMULARY Diet Type: NAS  . omeprazole-sodium bicarbonate (ZEGERID) 40-1100 MG capsule Take 1 capsule by mouth daily.  . rosuvastatin (CRESTOR) 20 MG tablet Take 1 tablet (20 mg  total) by mouth daily.  . sertraline (ZOLOFT) 100 MG tablet Take 1 tablet (100 mg total) by mouth daily.  . sodium chloride 1 g tablet Take 2 g by mouth 3 (three) times daily.  . tamsulosin (FLOMAX) 0.4 MG CAPS capsule Take 0.4 mg by mouth daily after breakfast.  . XARELTO 20 MG TABS tablet Take 1 tablet (20 mg total) by mouth daily.   No facility-administered encounter  medications on file as of 07/21/2019.      SIGNIFICANT DIAGNOSTIC EXAMS  LABS REVIEWED  07-20-19: glucose 94; bun 4; creat 0.56; k+ 3.2; na++ 132; ca 8.3   Review of Systems  Constitutional: Negative for malaise/fatigue.  Respiratory: Negative for cough and shortness of breath.   Cardiovascular: Negative for chest pain, palpitations and leg swelling.  Gastrointestinal: Negative for abdominal pain, constipation and heartburn.  Musculoskeletal: Positive for back pain. Negative for joint pain and myalgias.       Is chronic   Skin: Negative.   Neurological: Negative for dizziness.  Psychiatric/Behavioral: The patient is not nervous/anxious.      Physical Exam Constitutional:      General: She is not in acute distress.    Appearance: She is well-developed. She is not diaphoretic.     Comments: thin  Neck:     Musculoskeletal: Neck supple.     Thyroid: No thyromegaly.  Cardiovascular:     Rate and Rhythm: Normal rate and regular rhythm.     Pulses: Normal pulses.     Heart sounds: Normal heart sounds.  Pulmonary:     Effort: Pulmonary effort is normal. No respiratory distress.     Breath sounds: Normal breath sounds.  Abdominal:     General: Bowel sounds are normal. There is no distension.     Palpations: Abdomen is soft.     Tenderness: There is no abdominal tenderness.  Musculoskeletal: Normal range of motion.     Right lower leg: No edema.     Left lower leg: No edema.  Lymphadenopathy:     Cervical: No cervical adenopathy.  Skin:    General: Skin is warm and dry.     Comments: Incision line without signs of infection present Has bruising present on upper back   Neurological:     Mental Status: She is alert and oriented to person, place, and time.       ASSESSMENT/ PLAN:  TODAY;   1. Chronic diastolic congestive heart failure is stable: will continue lopressor 50 mg daily will monitor her status.   2. Hypertensive heart disease with chronic diastolic heart  failure: is stable b/p 166/98: will continue lopressor 50 mg in the AM and 25 mg in the PM  and cardizem 120 mg daily  3. Paroxysmal atrial fibrillation: heart rate is stable will continue cardiazem 120 mg daily and lopressor 50 mg in the AM and 25 mg in the PM  for rate control will continue xarelto 20 mg daily   4. COPD with asthma: is stable will continue albuterol 2 puff every 4 hours as needed; claritin 10 mg daily and singulair 10 mg daily pulmicort neb twice daily flonase daily   5. GERD without esophagitis: is stable will continue zegerid 40/1100 mg daily has bentyl 10 mg 4 times daily for spasms  6. Atherosclerosis of native coronary artery of native heart with stable angina: is stable will continue lopressor 50 mg in the AM 25 mg in the PM has prn ntg  7. Chronic constipation is stable will  continue miralax daily senna s 2 tabs daily   8. Acquired hypothyroidism: is stable will continue synthroid 25 mcg daily   9. Age related osteoporosis without current pathological fracture: is stable will continue prolia every 6 months and is on supplements.   10. Mixed hyperlipidemia: is stable will continue crestor 20 mg daily and lovaza 1 gm twice daily   11. Urine retention: is stable will continue flomax 0.4 mg daily   12. Arachnoid cyst: is status post surgical intervention: is stable will continue therapy as ordered will follow up with neurosurgery as indicated. Has vicodin 5/325 mg 1 or 2 tabs every 4 hours as needed and robaxin 500 mg four times daily as needed   13. Chronic hyponatremia: is stable will continue NACL 2 gm three times daily   14. Major depression, chronic: is stable will continue zoloft 100 mg daily xanax 1 mg twice daily as needed takes melatonin 10 mg nightly for sleep   Will check cbc bmp next week.       MD is aware of resident's narcotic use and is in agreement with current plan of care. We will attempt to wean resident as apropriate   Ok Edwards NP  [facility]  Contact [phone] Monday through Friday 8am- 5pm  After hours call [phone]

## 2019-07-22 ENCOUNTER — Other Ambulatory Visit: Payer: Self-pay | Admitting: Adult Health

## 2019-07-22 ENCOUNTER — Encounter: Payer: Self-pay | Admitting: Adult Health

## 2019-07-22 MED ORDER — HYDROCODONE-ACETAMINOPHEN 5-325 MG PO TABS: 1.0000 | ORAL_TABLET | ORAL | 0 refills | Status: DC

## 2019-07-22 NOTE — Progress Notes (Signed)
Location:    [place] Room Number: 159/P Place of Service:  SNF (31)   CODE STATUS: Full Code  Allergies  Allergen Reactions  . Naproxen Other (See Comments)    Tongue swelling  . Penicillins Swelling    Swelling around site Has patient had a PCN reaction causing immediate rash, facial/tongue/throat swelling, SOB or lightheadedness with hypotension: Yes Has patient had a PCN reaction causing severe rash involving mucus membranes or skin necrosis: No Has patient had a PCN reaction that required hospitalization: No Has patient had a PCN reaction occurring within the last 10 years: No If all of the above answers are "NO", then may proceed with Cephalosporin use.   . Sulfonamide Derivatives Hives  . Aspirin Other (See Comments)    REACTION: regular strength causes "heart to beat fast"  . Captopril Hypertension  . Strawberry Extract Other (See Comments)  . Clindamycin/Lincomycin Other (See Comments)    unknown    Chief Complaint  Patient presents with  . Acute Visit    Pain Management    HPI:    Past Medical History:  Diagnosis Date  . Anxiety   . Arthritis   . Asthmatic bronchitis   . Atrial fibrillation ([place])   . Bowel obstruction (HCC)    blockage  . CAD (coronary artery disease)    Stent to RI 2003.  Myoview 2013 no ischemia.  . Cataract   . Chronic back pain   . Chronic bronchitis ([place])   . Colon polyp    adenomatous  . Congestive heart disease (HCC)    Preserved EF  . Depression   . Esophageal motility disorder   . Fibromyalgia   . GERD (gastroesophageal reflux disease)   . History of hiatal hernia   . History of kidney stones   . Hyperlipidemia   . Hypertension   . Hypothyroid   . 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 Nephrolithiasis   . OA (osteoarthritis)   . Status post dilation of esophageal narrowing     Past Surgical History:  Procedure Laterality Date  . ABDOMINAL HYSTERECTOMY     partial  . ANKLE FUSION  08/27/2012   Procedure: ARTHRODESIS ANKLE;  Surgeon: Wylene Simmer, MD;  Location: [place];  Service: Orthopedics;  Laterality: Right;  Arthrodesis right ankle and subtalar joint  . APPENDECTOMY    . ARTHRODESIS TIBIOFIBULAR Right 03/31/2014   Procedure: REVISION OF SUBTALOR ARTHRODESIS  RIGHT ;  Surgeon: Wylene Simmer, MD;  Location: [facility] [place];  Service: Orthopedics;  Laterality: Right;  . BACK SURGERY    . BREAST SURGERY     Left breast lump removed  . BRONCHOSCOPY    . CORONARY ANGIOPLASTY WITH STENT PLACEMENT  2003  . DECORTICATION Right 05/03/2014   Procedure: DECORTICATION;  Surgeon: [name], MD;  Location: [facility];  Service: Thoracic;  Laterality: Right;  . EYE SURGERY  2016   cateracts  . FOOT ARTHRODESIS, SUBTALAR Right 2013  . HARDWARE REMOVAL Right 03/31/2014   Procedure: REMOVAL OF DEEP IMPLANTS X 3  RIGHT ;  Surgeon: Wylene Simmer, MD;  Location: [place];  Service: Orthopedics;  Laterality: Right;  . HARDWARE REMOVAL Right 11/03/2014   Procedure: HARDWARE REMOVAL OF SUBTALAR JOINT;  Surgeon: Wylene Simmer, MD;  Location: [facility];  Service: Orthopedics;  Laterality: Right;  . HARDWARE REVISION  03/31/2014   SUBTALOR  ARTHRODESIS       DR HEWITT  .  JOINT REPLACEMENT     Right knee  . KNEE ARTHROSCOPY  03/26/2012   Procedure: ARTHROSCOPY KNEE;  Surgeon: Wylene Simmer, MD;  Location: [facility];  Service: Orthopedics;  Laterality: Left;  with Debridement of Lateral Meniscus tear  . REMOVAL OF IMPLANT Right 03/31/2014   DR HEWITT  . ROTATOR CUFF REPAIR Bilateral   . SINUS SURGERY WITH INSTATRAK    . SPINAL CORD STIMULATOR INSERTION N/A 07/02/2017   Procedure: LUMBAR SPINAL CORD STIMULATOR INSERTION;  Surgeon: Melina Schools, MD;  Location: [facility];  Service: Orthopedics;  Laterality: N/A;  120 mins  . TOTAL KNEE ARTHROPLASTY Right   . VIDEO ASSISTED THORACOSCOPY (VATS)/EMPYEMA Right 05/03/2014   Procedure: VIDEO ASSISTED THORACOSCOPY (VATS)/EMPYEMA;   Surgeon: [name], MD;  Location: [facility];  Service: Thoracic;  Laterality: Right;  Marland Kitchen VIDEO BRONCHOSCOPY N/A 05/03/2014   Procedure: VIDEO BRONCHOSCOPY;  Surgeon: [name], MD;  Location: [facility] OR;  Service: Thoracic;  Laterality: N/A;    Social History   Socioeconomic History  . Marital status: Widowed    Spouse name: Not on file  . Number of children: 2  . Years of education: Not on file  . Highest education level: Not on file  Occupational History  . Occupation: Retired  Scientific laboratory technician  . Financial resource strain: Not on file  . Food insecurity    Worry: Not on file    Inability: Not on file  . Transportation needs    Medical: Not on file    Non-medical: Not on file  Tobacco Use  . Smoking status: Former Smoker    Packs/day: 1.00    Years: 30.00    Pack years: 30.00    Types: Cigarettes    Quit date: 12/16/1989    Years since quitting: 29.6  . Smokeless tobacco: Never Used  . Tobacco comment: smoked off & on  Substance and Sexual Activity  . Alcohol use: No    Alcohol/week: 0.0 standard drinks  . Drug use: No  . Sexual activity: Not Currently  Lifestyle  . Physical activity    Days per week: Not on file    Minutes per session: Not on file  . Stress: Not on file  Relationships  . Social Herbalist on phone: Not on file    Gets together: Not on file    Attends religious service: Not on file    Active member of club or organization: Not on file    Attends meetings of clubs or organizations: Not on file    Relationship status: Not on file  . Intimate partner violence    Fear of current or ex partner: Not on file    Emotionally abused: Not on file    Physically abused: Not on file    Forced sexual activity: Not on file  Other Topics Concern  . Not on file  Social History Narrative   Originally from [place]. Always lived in [place]. No international travel. Has prior travel to [place], [place], [place], [place], [place], & [place]. No recent travel. She has a cat  currently. No prior bird, mold, or hot tub exposure. 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 No known asbestos exposure. 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    Family History  Problem Relation Age of Onset  . Prostate cancer Brother   . Cancer Brother        PROSTATE  .  Heart disease Mother   . Asthma Mother   . Congestive Heart Failure Mother   . Emphysema Sister   . COPD Sister   . Stroke Sister   . Heart disease Sister   . Emphysema Brother   . Rheumatologic disease Neg Hx       VITAL SIGNS BP 121/73   Pulse 74   Temp 98 F (36.7 C) (Oral)   Resp 18   Ht 4\' 11"  (1.499 m)   Wt 142 lb 9.6 oz (64.7 kg)   BMI 28.80 kg/m   Outpatient Encounter Medications as of 07/22/2019  Medication Sig  . albuterol (VENTOLIN HFA) 108 (90 Base) MCG/ACT inhaler Inhale 2 puffs into the lungs every 4 (four) hours as needed for wheezing or shortness of breath.  . ALPRAZolam (XANAX) 1 MG tablet Take 1 mg by mouth 2 (two) times daily as needed for anxiety.  . Ascorbic Acid (VITAMIN C) 1000 MG tablet Take 1,000 mg by mouth daily.  . beta carotene 25000 UNIT capsule Take 25,000 Units by mouth daily.  . budesonide (PULMICORT) 0.5 MG/2ML nebulizer solution Take 0.5 mg by nebulization 2 (two) times daily as needed.  . calcium carbonate (TUMS - DOSED IN MG ELEMENTAL CALCIUM) 500 MG chewable tablet Chew 1 tablet by mouth 3 (three) times daily as needed for indigestion or heartburn.  . Calcium Carbonate-Vitamin D (CALCIUM 600+D) 600-400 MG-UNIT per tablet Take 1 tablet by mouth 2 (two) times daily.   . Coenzyme Q10 10 MG capsule Take 10 mg by mouth daily.  Marland Kitchen denosumab (PROLIA) 60 MG/ML SOSY injection Inject 60 mg every 6 (six) months into the skin. Administer in upper arm, thigh, or abdomen  . dicyclomine (BENTYL) 10 MG capsule Take 10 mg by mouth 4 (four) times daily as needed for spasms.  Marland Kitchen diltiazem (CARDIZEM) 120 MG tablet  Take 120 mg by mouth daily.  . fluticasone (FLONASE) 50 MCG/ACT nasal spray Place 2 sprays into both nostrils daily.  Marland Kitchen HYDROcodone-acetaminophen (NORCO/VICODIN) 5-325 MG tablet Take 1 tablet by mouth every 4 (four) hours for 5 days.  Marland Kitchen levothyroxine (SYNTHROID) 25 MCG tablet Take 25 mcg by mouth daily before breakfast.  . loratadine (CLARITIN) 10 MG tablet Take 1 tablet (10 mg total) by mouth daily.  . meclizine (ANTIVERT) 25 MG tablet Take 25 mg by mouth 3 (three) times daily as needed for dizziness.  . Melatonin 5 MG TABS Take 10 mg by mouth every evening.  . methocarbamol (ROBAXIN) 500 MG tablet Take 500 mg by mouth 4 (four) times daily as needed for muscle spasms.  . metoprolol tartrate (LOPRESSOR) 25 MG tablet Take 2 tablets 950 mg) by mouth once daily at 8:00 am  . metoprolol tartrate (LOPRESSOR) 25 MG tablet Take 25 mg by mouth every evening.  . montelukast (SINGULAIR) 10 MG tablet Take 10 mg by mouth at bedtime.  . Multiple Vitamin (MULTIVITAMIN) tablet Take 1 tablet by mouth daily.  . nitroGLYCERIN (NITROSTAT) 0.4 MG SL tablet Place 0.4 mg under the tongue every 5 (five) minutes as needed for chest pain.  . NON FORMULARY Diet Type: NAS Consistent Carbohydrate  . omega-3 acid ethyl esters (LOVAZA) 1 g capsule Take 2 g by mouth 2 (two) times daily.  Marland Kitchen omeprazole-sodium bicarbonate (ZEGERID) 40-1100 MG capsule Take 1 capsule by mouth daily.  . ondansetron (ZOFRAN) 4 MG tablet Take 4 mg by mouth every 6 (six) hours as needed for nausea or vomiting.  . polyethylene glycol (MIRALAX / GLYCOLAX) 17 g  packet Take 17 g by mouth daily.  . rosuvastatin (CRESTOR) 20 MG tablet Take 1 tablet (20 mg total) by mouth daily.  . sennosides-docusate sodium (SENOKOT-S) 8.6-50 MG tablet Take 1 tablet by mouth at bedtime.  . sertraline (ZOLOFT) 100 MG tablet Take 1 tablet (100 mg total) by mouth daily.  . sodium chloride 1 g tablet Take 2 g by mouth 3 (three) times daily.  . tamsulosin (FLOMAX) 0.4 MG CAPS  capsule Take 0.4 mg by mouth daily after breakfast.  . XARELTO 20 MG TABS tablet Take 1 tablet (20 mg total) by mouth daily.  . [DISCONTINUED] HYDROcodone-acetaminophen (NORCO/VICODIN) 5-325 MG tablet Take 1-2 tablets by mouth every 4 (four) hours as needed for moderate pain.   No facility-administered encounter medications on file as of 07/22/2019.      SIGNIFICANT DIAGNOSTIC EXAMS       ASSESSMENT/ PLAN:    MD is aware of resident's narcotic use and is in agreement with current plan of care. We will attempt to wean resident as apropriate   Ok Edwards NP [facility]  Contact [phone] Monday through Friday 8am- 5pm  After hours call [phone]

## 2019-07-23 ENCOUNTER — Other Ambulatory Visit (HOSPITAL_COMMUNITY)
Admission: AD | Admit: 2019-07-23 | Discharge: 2019-07-23 | Disposition: A | Discharge status: Home / Self Care | Payer: Medicare Other | Source: Other Acute Inpatient Hospital | Attending: Adult Health | Admitting: Adult Health

## 2019-07-23 ENCOUNTER — Encounter: Payer: Self-pay | Admitting: Adult Health

## 2019-07-23 ENCOUNTER — Non-Acute Institutional Stay (SKILLED_NURSING_FACILITY): Payer: Medicare Other | Admitting: Adult Health

## 2019-07-23 DIAGNOSIS — G96198 Other disorders of meninges, not elsewhere classified: Secondary | ICD-10-CM

## 2019-07-23 DIAGNOSIS — G9619 Other disorders of meninges, not elsewhere classified: Secondary | ICD-10-CM | POA: Diagnosis not present

## 2019-07-23 DIAGNOSIS — I5032 Chronic diastolic (congestive) heart failure: Secondary | ICD-10-CM | POA: Insufficient documentation

## 2019-07-23 DIAGNOSIS — G894 Chronic pain syndrome: Secondary | ICD-10-CM

## 2019-07-23 DIAGNOSIS — R32 Unspecified urinary incontinence: Secondary | ICD-10-CM | POA: Diagnosis present

## 2019-07-23 DIAGNOSIS — R339 Retention of urine, unspecified: Secondary | ICD-10-CM | POA: Insufficient documentation

## 2019-07-23 DIAGNOSIS — R3989 Other symptoms and signs involving the genitourinary system: Secondary | ICD-10-CM | POA: Insufficient documentation

## 2019-07-23 DIAGNOSIS — R300 Dysuria: Secondary | ICD-10-CM | POA: Diagnosis present

## 2019-07-23 DIAGNOSIS — K5909 Other constipation: Secondary | ICD-10-CM | POA: Insufficient documentation

## 2019-07-23 DIAGNOSIS — E871 Hypo-osmolality and hyponatremia: Secondary | ICD-10-CM | POA: Insufficient documentation

## 2019-07-23 NOTE — Progress Notes (Addendum)
Location:   [facility] Room Number: 159 P Place of Service:  SNF (31)   CODE STATUS: Full Code  Allergies  Allergen Reactions  . Naproxen Other (See Comments)    Tongue swelling  . Penicillins Swelling    Swelling around site Has patient had a PCN reaction causing immediate rash, facial/tongue/throat swelling, SOB or lightheadedness with hypotension: Yes Has patient had a PCN reaction causing severe rash involving mucus membranes or skin necrosis: No Has patient had a PCN reaction that required hospitalization: No Has patient had a PCN reaction occurring within the last 10 years: No If all of the above answers are "NO", then may proceed with Cephalosporin use.   . Sulfonamide Derivatives Hives  . Aspirin Other (See Comments)    REACTION: regular strength causes "heart to beat fast"  . Captopril Hypertension  . Strawberry Extract Other (See Comments)  . Clindamycin/Lincomycin Other (See Comments)    unknown    Chief Complaint  Patient presents with  . Acute Visit    pain management     HPI:  She has had a recent thoracic laminectomy due to an arachnoid cyst. She does have chronic back pain which is not being adequately managed. She has vicodin ordered every 4 hours as needed. She has not received a recent dose. She is in her room crying due to the pain and cannot focus at this time except to say that she is in pain.  She lives by herself has an apartment. She has a rollator and scooter at home. She has family support.  She is complaining of bladder pain; increased urinary incontinence and dysuria.    Past Medical History:  Diagnosis Date  . Anxiety   . Arthritis   . Asthmatic bronchitis   . Atrial fibrillation ([place])   . Bowel obstruction (HCC)    blockage  . CAD (coronary artery disease)    Stent to RI 2003.  Myoview 2013 no ischemia.  . Cataract   . Chronic back pain   . Chronic bronchitis ([place])   . Colon polyp    adenomatous  .  Congestive heart disease (HCC)    Preserved EF  . Depression   . Esophageal motility disorder   . Fibromyalgia   . GERD (gastroesophageal reflux disease)   . History of hiatal hernia   . History of kidney stones   . Hyperlipidemia   . Hypertension   . Hypothyroid   . 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 Nephrolithiasis   . OA (osteoarthritis)   . Status post dilation of esophageal narrowing     Past Surgical History:  Procedure Laterality Date  . ABDOMINAL HYSTERECTOMY     partial  . ANKLE FUSION  08/27/2012   Procedure: ARTHRODESIS ANKLE;  Surgeon: Wylene Simmer, MD;  Location: [place];  Service: Orthopedics;  Laterality: Right;  Arthrodesis right ankle and subtalar joint  . APPENDECTOMY    . ARTHRODESIS TIBIOFIBULAR Right 03/31/2014   Procedure: REVISION OF SUBTALOR ARTHRODESIS  RIGHT ;  Surgeon: Wylene Simmer, MD;  Location: [place];  Service: Orthopedics;  Laterality: Right;  . BACK SURGERY    . BREAST SURGERY     Left breast lump removed  . BRONCHOSCOPY    . CORONARY ANGIOPLASTY WITH STENT PLACEMENT  2003  . DECORTICATION Right 05/03/2014   Procedure: DECORTICATION;  Surgeon: [name], MD;  Location: [place];  Service: Thoracic;  Laterality: Right;  .  EYE SURGERY  2016   cateracts  . FOOT ARTHRODESIS, SUBTALAR Right 2013  . HARDWARE REMOVAL Right 03/31/2014   Procedure: REMOVAL OF DEEP IMPLANTS X 3  RIGHT ;  Surgeon: Wylene Simmer, MD;  Location: [place];  Service: Orthopedics;  Laterality: Right;  . HARDWARE REMOVAL Right 11/03/2014   Procedure: HARDWARE REMOVAL OF SUBTALAR JOINT;  Surgeon: Wylene Simmer, MD;  Location: [facility] [place];  Service: Orthopedics;  Laterality: Right;  . HARDWARE REVISION  03/31/2014   SUBTALOR  ARTHRODESIS       DR HEWITT  . JOINT REPLACEMENT     Right knee  . KNEE ARTHROSCOPY  03/26/2012   Procedure: ARTHROSCOPY KNEE;  Surgeon: Wylene Simmer, MD;  Location: [place];  Service: Orthopedics;  Laterality: Left;  with  Debridement of Lateral Meniscus tear  . REMOVAL OF IMPLANT Right 03/31/2014   DR HEWITT  . ROTATOR CUFF REPAIR Bilateral   . SINUS SURGERY WITH INSTATRAK    . SPINAL CORD STIMULATOR INSERTION N/A 07/02/2017   Procedure: LUMBAR SPINAL CORD STIMULATOR INSERTION;  Surgeon: Melina Schools, MD;  Location: [place];  Service: Orthopedics;  Laterality: N/A;  120 mins  . TOTAL KNEE ARTHROPLASTY Right   . VIDEO ASSISTED THORACOSCOPY (VATS)/EMPYEMA Right 05/03/2014   Procedure: VIDEO ASSISTED THORACOSCOPY (VATS)/EMPYEMA;  Surgeon: [name], MD;  Location: [facility];  Service: Thoracic;  Laterality: Right;  Marland Kitchen VIDEO BRONCHOSCOPY N/A 05/03/2014   Procedure: VIDEO BRONCHOSCOPY;  Surgeon: [name], MD;  Location: [facility];  Service: Thoracic;  Laterality: N/A;    Social History   Socioeconomic History  . Marital status: Widowed    Spouse name: Not on file  . Number of children: 2  . Years of education: Not on file  . Highest education level: Not on file  Occupational History  . Occupation: Retired  Scientific laboratory technician  . Financial resource strain: Not on file  . Food insecurity    Worry: Not on file    Inability: Not on file  . Transportation needs    Medical: Not on file    Non-medical: Not on file  Tobacco Use  . Smoking status: Former Smoker    Packs/day: 1.00    Years: 30.00    Pack years: 30.00    Types: Cigarettes    Quit date: 12/16/1989    Years since quitting: 29.6  . Smokeless tobacco: Never Used  . Tobacco comment: smoked off & on  Substance and Sexual Activity  . Alcohol use: No    Alcohol/week: 0.0 standard drinks  . Drug use: No  . Sexual activity: Not Currently  Lifestyle  . Physical activity    Days per week: Not on file    Minutes per session: Not on file  . Stress: Not on file  Relationships  . Social Herbalist on phone: Not on file    Gets together: Not on file    Attends religious service: Not on file    Active member of club or organization: Not  on file    Attends meetings of clubs or organizations: Not on file    Relationship status: Not on file  . Intimate partner violence    Fear of current or ex partner: Not on file    Emotionally abused: Not on file    Physically abused: Not on file    Forced sexual activity: Not on file  Other Topics Concern  . Not on file  Social History Narrative  Originally from [facility]. Always lived in [place]. No international travel. Has prior travel to [place], [place], [place], [place], [place], & [place]. No recent travel. She has a cat currently. No prior bird, mold, or hot tub exposure. 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 No known asbestos exposure. 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    Family History  Problem Relation Age of Onset  . Prostate cancer Brother   . Cancer Brother        PROSTATE  . Heart disease Mother   . Asthma Mother   . Congestive Heart Failure Mother   . Emphysema Sister   . COPD Sister   . Stroke Sister   . Heart disease Sister   . Emphysema Brother   . Rheumatologic disease Neg Hx       VITAL SIGNS BP (!) 142/75   Pulse 74   Temp 97.8 F (36.6 C)   Resp 20   Ht 4\' 11"  (1.499 m)   Wt 142 lb 9.6 oz (64.7 kg)   BMI 28.80 kg/m   Outpatient Encounter Medications as of 07/23/2019  Medication Sig  . albuterol (VENTOLIN HFA) 108 (90 Base) MCG/ACT inhaler Inhale 2 puffs into the lungs every 4 (four) hours as needed for wheezing or shortness of breath.  . ALPRAZolam (XANAX) 1 MG tablet Take 1 mg by mouth 2 (two) times daily as needed for anxiety.  . Ascorbic Acid (VITAMIN C) 1000 MG tablet Take 1,000 mg by mouth daily.  . beta carotene 25000 UNIT capsule Take 25,000 Units by mouth daily.  . budesonide (PULMICORT) 0.5 MG/2ML nebulizer solution Take 0.5 mg by nebulization 2 (two) times daily as needed.  . calcium carbonate (TUMS - DOSED IN MG ELEMENTAL CALCIUM) 500 MG chewable tablet Chew 1 tablet by mouth 3  (three) times daily as needed for indigestion or heartburn.  . Calcium Carbonate-Vitamin D (CALCIUM 600+D) 600-400 MG-UNIT per tablet Take 1 tablet by mouth 2 (two) times daily.   . Coenzyme Q10 10 MG capsule Take 10 mg by mouth daily.  Marland Kitchen denosumab (PROLIA) 60 MG/ML SOSY injection Inject 60 mg every 6 (six) months into the skin. Administer in upper arm, thigh, or abdomen  . dicyclomine (BENTYL) 10 MG capsule Take 10 mg by mouth 4 (four) times daily as needed for spasms.  Marland Kitchen diltiazem (CARDIZEM) 120 MG tablet Take 120 mg by mouth daily.  . fluticasone (FLONASE) 50 MCG/ACT nasal spray Place 2 sprays into both nostrils daily.  Marland Kitchen HYDROcodone-acetaminophen (NORCO/VICODIN) 5-325 MG tablet Take 1 tablet by mouth every 4 (four) hours for 5 days.  Marland Kitchen levothyroxine (SYNTHROID) 25 MCG tablet Take 25 mcg by mouth daily before breakfast.  . loratadine (CLARITIN) 10 MG tablet Take 1 tablet (10 mg total) by mouth daily.  . meclizine (ANTIVERT) 25 MG tablet Take 25 mg by mouth 3 (three) times daily as needed for dizziness.  . Melatonin 5 MG TABS Take 10 mg by mouth every evening.  . methocarbamol (ROBAXIN) 500 MG tablet Take 500 mg by mouth 4 (four) times daily as needed for muscle spasms.  . metoprolol tartrate (LOPRESSOR) 25 MG tablet Take 2 tablets 950 mg) by mouth once daily at 8:00 am  . metoprolol tartrate (LOPRESSOR) 25 MG tablet Take 25 mg by mouth every evening.  . montelukast (SINGULAIR) 10 MG tablet Take 10 mg by mouth at bedtime.  . Multiple Vitamin (MULTIVITAMIN) tablet Take 1 tablet by  mouth daily.  . nitroGLYCERIN (NITROSTAT) 0.4 MG SL tablet Place 0.4 mg under the tongue every 5 (five) minutes as needed for chest pain.  . NON FORMULARY Diet Type: NAS  . omega-3 acid ethyl esters (LOVAZA) 1 g capsule Take 2 g by mouth 2 (two) times daily.  Marland Kitchen omeprazole-sodium bicarbonate (ZEGERID) 40-1100 MG capsule Take 1 capsule by mouth daily.  . ondansetron (ZOFRAN) 4 MG tablet Take 4 mg by mouth every 6 (six)  hours as needed for nausea or vomiting.  . polyethylene glycol (MIRALAX / GLYCOLAX) 17 g packet Take 17 g by mouth daily.  . rosuvastatin (CRESTOR) 20 MG tablet Take 1 tablet (20 mg total) by mouth daily.  . sennosides-docusate sodium (SENOKOT-S) 8.6-50 MG tablet Take 1 tablet by mouth at bedtime.  . sertraline (ZOLOFT) 100 MG tablet Take 1 tablet (100 mg total) by mouth daily.  . sodium chloride 1 g tablet Take 2 g by mouth 3 (three) times daily.  . tamsulosin (FLOMAX) 0.4 MG CAPS capsule Take 0.4 mg by mouth daily after breakfast.  . XARELTO 20 MG TABS tablet Take 1 tablet (20 mg total) by mouth daily.   No facility-administered encounter medications on file as of 07/23/2019.      SIGNIFICANT DIAGNOSTIC EXAMS   LABS REVIEWED PREVIOUS   07-20-19: glucose 94; bun 4; creat 0.56; k+ 3.2; na++ 132; ca 8.3   NO NEW LABS.    Review of Systems  Constitutional: Negative for malaise/fatigue.  Respiratory: Negative for cough and shortness of breath.   Cardiovascular: Negative for chest pain, palpitations and leg swelling.  Gastrointestinal: Negative for abdominal pain, constipation and heartburn.  Musculoskeletal: Positive for back pain. Negative for joint pain and myalgias.       Not managed   Skin: Negative.   Neurological: Negative for dizziness.  Psychiatric/Behavioral: The patient is not nervous/anxious.     Physical Exam Constitutional:      General: She is not in acute distress.    Appearance: She is well-developed. She is not diaphoretic.     Comments: thin  Neck:     Musculoskeletal: Neck supple.     Thyroid: No thyromegaly.  Cardiovascular:     Rate and Rhythm: Normal rate and regular rhythm.     Pulses: Normal pulses.     Heart sounds: Normal heart sounds.  Pulmonary:     Effort: Pulmonary effort is normal. No respiratory distress.     Breath sounds: Normal breath sounds.  Abdominal:     General: Bowel sounds are normal. There is no distension.     Palpations:  Abdomen is soft.     Tenderness: There is no abdominal tenderness.  Musculoskeletal: Normal range of motion.     Right lower leg: No edema.     Left lower leg: No edema.  Lymphadenopathy:     Cervical: No cervical adenopathy.  Skin:    General: Skin is warm and dry.     Comments: Incision line without signs of infection present Has bruising present on upper back    Neurological:     Mental Status: She is alert and oriented to person, place, and time.  Psychiatric:        Mood and Affect: Mood normal.       ASSESSMENT/ PLAN:  TODAY  1. Chronic pain syndrome 2. Arachnoid cyst of spine  Will get a urine culture Her pain is presently not managed will change her vicodin to 5/325 mg every 4 hours through 07-27-19  will then re-evaluate her pain med needs  MD is aware of resident's narcotic use and is in agreement with current plan of care. We will attempt to wean resident as apropriate   Ok Edwards NP [facility] Adult Medicine  Contact [phone] Monday through Friday 8am- 5pm  After hours call [phone]

## 2019-07-25 LAB — URINE CULTURE

## 2019-07-25 NOTE — Progress Notes (Signed)
This encounter was created in error - please disregard.

## 2019-07-26 ENCOUNTER — Encounter: Payer: Self-pay | Admitting: Adult Health

## 2019-07-26 ENCOUNTER — Encounter (HOSPITAL_COMMUNITY)
Admission: AD | Admit: 2019-07-26 | Discharge: 2019-07-26 | Disposition: A | Discharge status: Home / Self Care | Payer: Medicare Other | Source: Skilled Nursing Facility | Attending: Adult Health | Admitting: Adult Health

## 2019-07-26 ENCOUNTER — Other Ambulatory Visit: Payer: Self-pay | Admitting: Adult Health

## 2019-07-26 ENCOUNTER — Non-Acute Institutional Stay (SKILLED_NURSING_FACILITY): Payer: Medicare Other | Admitting: Adult Health

## 2019-07-26 DIAGNOSIS — I110 Hypertensive heart disease with heart failure: Secondary | ICD-10-CM

## 2019-07-26 DIAGNOSIS — E785 Hyperlipidemia, unspecified: Secondary | ICD-10-CM | POA: Insufficient documentation

## 2019-07-26 DIAGNOSIS — G9619 Other disorders of meninges, not elsewhere classified: Secondary | ICD-10-CM | POA: Diagnosis not present

## 2019-07-26 DIAGNOSIS — G894 Chronic pain syndrome: Secondary | ICD-10-CM | POA: Diagnosis not present

## 2019-07-26 DIAGNOSIS — I5032 Chronic diastolic (congestive) heart failure: Secondary | ICD-10-CM

## 2019-07-26 DIAGNOSIS — G96198 Other disorders of meninges, not elsewhere classified: Secondary | ICD-10-CM

## 2019-07-26 LAB — BASIC METABOLIC PANEL
Anion gap: 9 (ref 5–15)
BUN: 9 mg/dL (ref 8–23)
CO2: 26 mmol/L (ref 22–32)
Calcium: 9 mg/dL (ref 8.9–10.3)
Chloride: 98 mmol/L (ref 98–111)
Creatinine, Ser: 0.62 mg/dL (ref 0.44–1.00)
GFR calc Af Amer: >60 mL/min (ref >60)
GFR calc non Af Amer: >60 mL/min (ref >60)
Glucose, Bld: 110 mg/dL — ABNORMAL HIGH (ref 70–99)
Potassium: 3.8 mmol/L (ref 3.5–5.1)
Sodium: 133 mmol/L — ABNORMAL LOW (ref 135–145)

## 2019-07-26 LAB — CBC
HCT: 31.8 % — ABNORMAL LOW (ref 36.0–46.0)
Hemoglobin: 10.8 g/dL — ABNORMAL LOW (ref 12.0–15.0)
MCH: 32.8 pg (ref 26.0–34.0)
MCHC: 34 g/dL (ref 30.0–36.0)
MCV: 96.7 fL (ref 80.0–100.0)
Platelets: 345 10*3/uL (ref 150–400)
RBC: 3.29 MIL/uL — ABNORMAL LOW (ref 3.87–5.11)
RDW: 14.4 % (ref 11.5–15.5)
WBC: 9.9 10*3/uL (ref 4.0–10.5)
nRBC: 0 % (ref 0.0–0.2)

## 2019-07-26 MED ORDER — MECLIZINE HCL 25 MG PO TABS: 25.0000 mg | ORAL_TABLET | Freq: Three times a day (TID) | ORAL | 0 refills | Status: AC | PRN

## 2019-07-26 MED ORDER — NITROGLYCERIN 0.4 MG SL SUBL: 0.4000 mg | SUBLINGUAL_TABLET | SUBLINGUAL | 0 refills | Status: DC | PRN

## 2019-07-26 MED ORDER — TAMSULOSIN HCL 0.4 MG PO CAPS: 0.4000 mg | ORAL_CAPSULE | Freq: Every day | ORAL | 0 refills | Status: DC

## 2019-07-26 MED ORDER — ALPRAZOLAM 1 MG PO TABS: 1.0000 mg | ORAL_TABLET | Freq: Two times a day (BID) | ORAL | 0 refills | Status: DC | PRN

## 2019-07-26 MED ORDER — OMEGA-3-ACID ETHYL ESTERS 1 G PO CAPS: 2.0000 g | ORAL_CAPSULE | Freq: Two times a day (BID) | ORAL | 0 refills | Status: DC

## 2019-07-26 MED ORDER — ROSUVASTATIN CALCIUM 20 MG PO TABS: 20.0000 mg | ORAL_TABLET | Freq: Every day | ORAL | 0 refills | Status: DC

## 2019-07-26 MED ORDER — RIVAROXABAN 20 MG PO TABS: 20.0000 mg | ORAL_TABLET | Freq: Every day | ORAL | 0 refills | Status: DC

## 2019-07-26 MED ORDER — METHOCARBAMOL 500 MG PO TABS: 500.0000 mg | ORAL_TABLET | Freq: Four times a day (QID) | ORAL | 0 refills | Status: AC | PRN

## 2019-07-26 MED ORDER — DICYCLOMINE HCL 10 MG PO CAPS: 10.0000 mg | ORAL_CAPSULE | Freq: Four times a day (QID) | ORAL | 0 refills | Status: DC | PRN

## 2019-07-26 MED ORDER — HYDROCODONE-ACETAMINOPHEN 5-325 MG PO TABS: 1.0000 | ORAL_TABLET | ORAL | 0 refills | Status: DC | PRN

## 2019-07-26 MED ORDER — METOPROLOL TARTRATE 25 MG PO TABS: 50.0000 mg | ORAL_TABLET | Freq: Every day | ORAL | 0 refills | Status: DC

## 2019-07-26 MED ORDER — SODIUM CHLORIDE 1 G PO TABS: 2.0000 g | ORAL_TABLET | Freq: Three times a day (TID) | ORAL | 0 refills | Status: DC

## 2019-07-26 MED ORDER — METOPROLOL TARTRATE 25 MG PO TABS: 25.0000 mg | ORAL_TABLET | Freq: Every evening | ORAL | 0 refills | Status: DC

## 2019-07-26 MED ORDER — ALBUTEROL SULFATE HFA 108 (90 BASE) MCG/ACT IN AERS: 2.0000 | INHALATION_SPRAY | RESPIRATORY_TRACT | 0 refills | Status: AC | PRN

## 2019-07-26 MED ORDER — OMEPRAZOLE-SODIUM BICARBONATE 40-1100 MG PO CAPS: 1.0000 | ORAL_CAPSULE | Freq: Every day | ORAL | 0 refills | Status: DC

## 2019-07-26 MED ORDER — LEVOTHYROXINE SODIUM 25 MCG PO TABS: 25.0000 ug | ORAL_TABLET | Freq: Every day | ORAL | 0 refills | Status: DC

## 2019-07-26 MED ORDER — MONTELUKAST SODIUM 10 MG PO TABS: 10.0000 mg | ORAL_TABLET | Freq: Every day | ORAL | 0 refills | Status: DC

## 2019-07-26 MED ORDER — ONDANSETRON HCL 4 MG PO TABS: 4.0000 mg | ORAL_TABLET | Freq: Four times a day (QID) | ORAL | 0 refills | Status: DC | PRN

## 2019-07-26 MED ORDER — BUDESONIDE 0.5 MG/2ML IN SUSP: 0.5000 mg | Freq: Two times a day (BID) | RESPIRATORY_TRACT | 0 refills | Status: DC | PRN

## 2019-07-26 MED ORDER — DILTIAZEM HCL 120 MG PO TABS: 120.0000 mg | ORAL_TABLET | Freq: Every day | ORAL | 0 refills | Status: DC

## 2019-07-26 MED ORDER — SERTRALINE HCL 100 MG PO TABS: 100.0000 mg | ORAL_TABLET | Freq: Every day | ORAL | 0 refills | Status: DC

## 2019-07-26 NOTE — Progress Notes (Signed)
Location:   [facility] Room Number: 159 P Place of Service:  SNF (31)    CODE STATUS: Full code  Allergies  Allergen Reactions  . Naproxen Other (See Comments)    Tongue swelling  . Penicillins Swelling    Swelling around site Has patient had a PCN reaction causing immediate rash, facial/tongue/throat swelling, SOB or lightheadedness with hypotension: Yes Has patient had a PCN reaction causing severe rash involving mucus membranes or skin necrosis: No Has patient had a PCN reaction that required hospitalization: No Has patient had a PCN reaction occurring within the last 10 years: No If all of the above answers are "NO", then may proceed with Cephalosporin use.   . Sulfonamide Derivatives Hives  . Aspirin Other (See Comments)    REACTION: regular strength causes "heart to beat fast"  . Captopril Hypertension  . Strawberry Extract Other (See Comments)  . Clindamycin/Lincomycin Other (See Comments)    unknown    Chief Complaint  Patient presents with  . Discharge Note    Discharging to home on 8/7/20020    HPI:  She is being discharged to home with home health for pt/ot. She will need her prescriptions written and will need to follow up iwih her medical provider. She will not need any dme. He had been hospitalized for a thoracic laminectomy and arachnoid cyst. She was admitted to this facility for short term rehab. She has done well and is ready to complete her therapy on a home health basis.     Past Medical History:  Diagnosis Date  . Anxiety   . Arthritis   . Asthmatic bronchitis   . Atrial fibrillation ([place])   . Bowel obstruction ([facility])    blockage  . CAD (coronary artery disease)    Stent to RI 2003.  Myoview 2013 no ischemia.  . Cataract   . Chronic back pain   . Chronic bronchitis ([place])   . Colon polyp    adenomatous  . Congestive heart disease ([facility])    Preserved EF  . Depression   . Esophageal motility disorder   .  Fibromyalgia   . GERD (gastroesophageal reflux disease)   . History of hiatal hernia   . History of kidney stones   . Hyperlipidemia   . Hypertension   . Hypothyroid   . 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 Nephrolithiasis   . OA (osteoarthritis)   . Status post dilation of esophageal narrowing     Past Surgical History:  Procedure Laterality Date  . ABDOMINAL HYSTERECTOMY     partial  . ANKLE FUSION  08/27/2012   Procedure: ARTHRODESIS ANKLE;  Surgeon: Wylene Simmer, MD;  Location: [facility];  Service: Orthopedics;  Laterality: Right;  Arthrodesis right ankle and subtalar joint  . APPENDECTOMY    . ARTHRODESIS TIBIOFIBULAR Right 03/31/2014   Procedure: REVISION OF SUBTALOR ARTHRODESIS  RIGHT ;  Surgeon: Wylene Simmer, MD;  Location: [facility];  Service: Orthopedics;  Laterality: Right;  . BACK SURGERY    . BREAST SURGERY     Left breast lump removed  . BRONCHOSCOPY    . CORONARY ANGIOPLASTY WITH STENT PLACEMENT  2003  . DECORTICATION Right 05/03/2014   Procedure: DECORTICATION;  Surgeon: [name], MD;  Location: [facility];  Service: Thoracic;  Laterality: Right;  . EYE SURGERY  2016   cateracts  . FOOT ARTHRODESIS, SUBTALAR Right 2013  . HARDWARE REMOVAL Right 03/31/2014  Procedure: REMOVAL OF DEEP IMPLANTS X 3  RIGHT ;  Surgeon: Wylene Simmer, MD;  Location: [facility];  Service: Orthopedics;  Laterality: Right;  . HARDWARE REMOVAL Right 11/03/2014   Procedure: HARDWARE REMOVAL OF SUBTALAR JOINT;  Surgeon: Wylene Simmer, MD;  Location: [facility];  Service: Orthopedics;  Laterality: Right;  . HARDWARE REVISION  03/31/2014   SUBTALOR  ARTHRODESIS       DR HEWITT  . JOINT REPLACEMENT     Right knee  . KNEE ARTHROSCOPY  03/26/2012   Procedure: ARTHROSCOPY KNEE;  Surgeon: Wylene Simmer, MD;  Location: [facility];  Service: Orthopedics;  Laterality: Left;  with Debridement of Lateral Meniscus tear  . REMOVAL OF IMPLANT Right 03/31/2014   DR HEWITT  . ROTATOR  CUFF REPAIR Bilateral   . SINUS SURGERY WITH INSTATRAK    . SPINAL CORD STIMULATOR INSERTION N/A 07/02/2017   Procedure: LUMBAR SPINAL CORD STIMULATOR INSERTION;  Surgeon: Melina Schools, MD;  Location: [facility];  Service: Orthopedics;  Laterality: N/A;  120 mins  . TOTAL KNEE ARTHROPLASTY Right   . VIDEO ASSISTED THORACOSCOPY (VATS)/EMPYEMA Right 05/03/2014   Procedure: VIDEO ASSISTED THORACOSCOPY (VATS)/EMPYEMA;  Surgeon: [name], MD;  Location: [facility];  Service: Thoracic;  Laterality: Right;  Marland Kitchen VIDEO BRONCHOSCOPY N/A 05/03/2014   Procedure: VIDEO BRONCHOSCOPY;  Surgeon: [name], MD;  Location: [facility] OR;  Service: Thoracic;  Laterality: N/A;    Social History   Socioeconomic History  . Marital status: Widowed    Spouse name: Not on file  . Number of children: 2  . Years of education: Not on file  . Highest education level: Not on file  Occupational History  . Occupation: Retired  Scientific laboratory technician  . Financial resource strain: Not on file  . Food insecurity    Worry: Not on file    Inability: Not on file  . Transportation needs    Medical: Not on file    Non-medical: Not on file  Tobacco Use  . Smoking status: Former Smoker    Packs/day: 1.00    Years: 30.00    Pack years: 30.00    Types: Cigarettes    Quit date: 12/16/1989    Years since quitting: 29.6  . Smokeless tobacco: Never Used  . Tobacco comment: smoked off & on  Substance and Sexual Activity  . Alcohol use: No    Alcohol/week: 0.0 standard drinks  . Drug use: No  . Sexual activity: Not Currently  Lifestyle  . Physical activity    Days per week: Not on file    Minutes per session: Not on file  . Stress: Not on file  Relationships  . Social Herbalist on phone: Not on file    Gets together: Not on file    Attends religious service: Not on file    Active member of club or organization: Not on file    Attends meetings of clubs or organizations: Not on file    Relationship status: Not on  file  . Intimate partner violence    Fear of current or ex partner: Not on file    Emotionally abused: Not on file    Physically abused: Not on file    Forced sexual activity: Not on file  Other Topics Concern  . Not on file  Social History Narrative   Originally from [place]. Always lived in [place]. No international travel. Has prior travel to [place], [place], [place], [place], [place], & [place].  No recent travel. She has a cat currently. No prior bird, mold, or hot tub exposure. 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 No known asbestos exposure. 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    Family History  Problem Relation Age of Onset  . Prostate cancer Brother   . Cancer Brother        PROSTATE  . Heart disease Mother   . Asthma Mother   . Congestive Heart Failure Mother   . Emphysema Sister   . COPD Sister   . Stroke Sister   . Heart disease Sister   . Emphysema Brother   . Rheumatologic disease Neg Hx     VITAL SIGNS BP 111/70   Pulse 73   Temp (!) 96.8 F (36 C)   Resp 20   Ht 4\' 11"  (1.499 m)   Wt 142 lb 9.6 oz (64.7 kg)   BMI 28.80 kg/m   Patient's Medications  New Prescriptions   No medications on file  Previous Medications   ALBUTEROL (VENTOLIN HFA) 108 (90 BASE) MCG/ACT INHALER    Inhale 2 puffs into the lungs every 4 (four) hours as needed for wheezing or shortness of breath.   ALPRAZOLAM (XANAX) 1 MG TABLET    Take 1 mg by mouth 2 (two) times daily as needed for anxiety.   ASCORBIC ACID (VITAMIN C) 1000 MG TABLET    Take 1,000 mg by mouth daily.   BETA CAROTENE 09983 UNIT CAPSULE    Take 25,000 Units by mouth daily.   BUDESONIDE (PULMICORT) 0.5 MG/2ML NEBULIZER SOLUTION    Take 0.5 mg by nebulization 2 (two) times daily as needed.   CALCIUM CARBONATE (TUMS - DOSED IN MG ELEMENTAL CALCIUM) 500 MG CHEWABLE TABLET    Chew 1 tablet by mouth 3 (three) times daily as needed for indigestion or heartburn.   CALCIUM  CARBONATE-VITAMIN D (CALCIUM 600+D) 600-400 MG-UNIT PER TABLET    Take 1 tablet by mouth 2 (two) times daily.    COENZYME Q10 10 MG CAPSULE    Take 10 mg by mouth daily.   DENOSUMAB (PROLIA) 60 MG/ML SOSY INJECTION    Inject 60 mg every 6 (six) months into the skin. Administer in upper arm, thigh, or abdomen   DICYCLOMINE (BENTYL) 10 MG CAPSULE    Take 10 mg by mouth 4 (four) times daily as needed for spasms.   DILTIAZEM (CARDIZEM) 120 MG TABLET    Take 120 mg by mouth daily.   FLUTICASONE (FLONASE) 50 MCG/ACT NASAL SPRAY    Place 2 sprays into both nostrils daily.   HYDROCODONE-ACETAMINOPHEN (NORCO/VICODIN) 5-325 MG TABLET    Take 1 tablet by mouth every 4 (four) hours for 5 days.   LEVOTHYROXINE (SYNTHROID) 25 MCG TABLET    Take 25 mcg by mouth daily before breakfast.   LORATADINE (CLARITIN) 10 MG TABLET    Take 1 tablet (10 mg total) by mouth daily.   MECLIZINE (ANTIVERT) 25 MG TABLET    Take 25 mg by mouth 3 (three) times daily as needed for dizziness.   MELATONIN 5 MG TABS    Take 10 mg by mouth every evening.   METHOCARBAMOL (ROBAXIN) 500 MG TABLET    Take 500 mg by mouth 4 (four) times daily as needed for muscle spasms.   METOPROLOL TARTRATE (LOPRESSOR) 25 MG TABLET    Take 2 tablets (50 mg) by mouth once daily at 8:00 am  METOPROLOL TARTRATE (LOPRESSOR) 25 MG TABLET    Take 25 mg by mouth every evening.   MONTELUKAST (SINGULAIR) 10 MG TABLET    Take 10 mg by mouth at bedtime.   MULTIPLE VITAMIN (MULTIVITAMIN) TABLET    Take 1 tablet by mouth daily.   NITROGLYCERIN (NITROSTAT) 0.4 MG SL TABLET    Place 0.4 mg under the tongue every 5 (five) minutes as needed for chest pain.   NON FORMULARY    Diet Type: NAS   OMEGA-3 ACID ETHYL ESTERS (LOVAZA) 1 G CAPSULE    Take 2 g by mouth 2 (two) times daily.   OMEPRAZOLE-SODIUM BICARBONATE (ZEGERID) 40-1100 MG CAPSULE    Take 1 capsule by mouth daily.   ONDANSETRON (ZOFRAN) 4 MG TABLET    Take 4 mg by mouth every 6 (six) hours as needed for nausea or  vomiting.   POLYETHYLENE GLYCOL (MIRALAX / GLYCOLAX) 17 G PACKET    Take 17 g by mouth daily.   ROSUVASTATIN (CRESTOR) 20 MG TABLET    Take 1 tablet (20 mg total) by mouth daily.   SENNOSIDES-DOCUSATE SODIUM (SENOKOT-S) 8.6-50 MG TABLET    Take 1 tablet by mouth at bedtime.   SERTRALINE (ZOLOFT) 100 MG TABLET    Take 1 tablet (100 mg total) by mouth daily.   SODIUM CHLORIDE 1 G TABLET    Take 2 g by mouth 3 (three) times daily.   TAMSULOSIN (FLOMAX) 0.4 MG CAPS CAPSULE    Take 0.4 mg by mouth daily after breakfast.   XARELTO 20 MG TABS TABLET    Take 1 tablet (20 mg total) by mouth daily.  Modified Medications   No medications on file  Discontinued Medications   No medications on file     SIGNIFICANT DIAGNOSTIC EXAMS  LABS REVIEWED PREVIOUS   07-20-19: glucose 94; bun 4; creat 0.56; k+ 3.2; na++ 132; ca 8.3   NO NEW LABS.    Review of Systems  Constitutional: Negative for malaise/fatigue.  Respiratory: Negative for cough and shortness of breath.   Cardiovascular: Negative for chest pain, palpitations and leg swelling.  Gastrointestinal: Negative for abdominal pain, constipation and heartburn.  Musculoskeletal: Negative for back pain, joint pain and myalgias.  Skin: Negative.   Neurological: Negative for dizziness.  Psychiatric/Behavioral: The patient is not nervous/anxious.     Physical Exam Constitutional:      General: She is not in acute distress.    Appearance: She is well-developed. She is not diaphoretic.     Comments: Thin   Neck:     Musculoskeletal: Neck supple.     Thyroid: No thyromegaly.  Cardiovascular:     Rate and Rhythm: Normal rate and regular rhythm.     Pulses: Normal pulses.     Heart sounds: Normal heart sounds.  Pulmonary:     Effort: Pulmonary effort is normal. No respiratory distress.     Breath sounds: Normal breath sounds.  Abdominal:     General: Bowel sounds are normal. There is no distension.     Palpations: Abdomen is soft.      Tenderness: There is no abdominal tenderness.  Musculoskeletal: Normal range of motion.     Right lower leg: No edema.     Left lower leg: No edema.  Lymphadenopathy:     Cervical: No cervical adenopathy.  Skin:    General: Skin is warm and dry.     Comments:  Incision line without signs of infection present Has bruising present on upper back  Neurological:     Mental Status: She is alert and oriented to person, place, and time.  Psychiatric:        Mood and Affect: Mood normal.      ASSESSMENT/ PLAN:  Patient is being discharged with the following home health services:  Pt/ot to evaluate and treat as indicated for gait balance strength adl training.   Patient is being discharged with the following durable medical equipment:  None needed   Patient has been advised to f/u with their PCP in 1-2 weeks to bring them up to date on their rehab stay.  Social services at facility was responsible for arranging this appointment.  Pt was provided with a 30 day supply of prescriptions for medications and refills must be obtained from their PCP.  For controlled substances, a more limited supply may be provided adequate until PCP appointment only.  She has a urine culture pending: will send results to her PCP  A 30 day supply of her prescription medications with #30 xanax and #10 vicodin to [place]  Time spent with patient: 35 minutes: medications; home health needs and dme.    Ok Edwards NP [facility]  Contact [phone] Monday through Friday 8am- 5pm  After hours call [phone]

## 2019-07-27 ENCOUNTER — Other Ambulatory Visit: Payer: Self-pay | Admitting: Family Medicine

## 2019-07-27 ENCOUNTER — Telehealth: Payer: Self-pay | Admitting: *Deleted

## 2019-07-27 DIAGNOSIS — E871 Hypo-osmolality and hyponatremia: Secondary | ICD-10-CM

## 2019-07-27 DIAGNOSIS — D649 Anemia, unspecified: Secondary | ICD-10-CM

## 2019-07-27 DIAGNOSIS — E039 Hypothyroidism, unspecified: Secondary | ICD-10-CM

## 2019-07-27 LAB — URINE CULTURE: Culture: <10000 — AB

## 2019-07-27 NOTE — Telephone Encounter (Signed)
Vm from [facility] Pt has been discharged from the [facility] her is for 08/05/19 Provider at the [facility] is wanting her sodiumlevel checked before then Please advise

## 2019-07-27 NOTE — Telephone Encounter (Signed)
I can order BMP, she may come in for lab visit to have checked prior to our appt

## 2019-07-28 DIAGNOSIS — Z96651 Presence of right artificial knee joint: Secondary | ICD-10-CM | POA: Diagnosis not present

## 2019-07-28 DIAGNOSIS — I480 Paroxysmal atrial fibrillation: Secondary | ICD-10-CM | POA: Diagnosis not present

## 2019-07-28 DIAGNOSIS — M1990 Unspecified osteoarthritis, unspecified site: Secondary | ICD-10-CM | POA: Diagnosis not present

## 2019-07-28 DIAGNOSIS — S22080D Wedge compression fracture of T11-T12 vertebra, subsequent encounter for fracture with routine healing: Secondary | ICD-10-CM | POA: Diagnosis not present

## 2019-07-28 DIAGNOSIS — J42 Unspecified chronic bronchitis: Secondary | ICD-10-CM | POA: Diagnosis not present

## 2019-07-28 DIAGNOSIS — I110 Hypertensive heart disease with heart failure: Secondary | ICD-10-CM | POA: Diagnosis not present

## 2019-07-28 DIAGNOSIS — M797 Fibromyalgia: Secondary | ICD-10-CM | POA: Diagnosis not present

## 2019-07-28 DIAGNOSIS — Z4789 Encounter for other orthopedic aftercare: Secondary | ICD-10-CM | POA: Diagnosis not present

## 2019-07-28 DIAGNOSIS — I2510 Atherosclerotic heart disease of native coronary artery without angina pectoris: Secondary | ICD-10-CM | POA: Diagnosis not present

## 2019-07-28 DIAGNOSIS — G8929 Other chronic pain: Secondary | ICD-10-CM | POA: Diagnosis not present

## 2019-07-28 DIAGNOSIS — K224 Dyskinesia of esophagus: Secondary | ICD-10-CM | POA: Diagnosis not present

## 2019-07-28 DIAGNOSIS — I509 Heart failure, unspecified: Secondary | ICD-10-CM | POA: Diagnosis not present

## 2019-07-28 DIAGNOSIS — M899 Disorder of bone, unspecified: Secondary | ICD-10-CM | POA: Diagnosis not present

## 2019-07-28 DIAGNOSIS — G930 Cerebral cysts: Secondary | ICD-10-CM | POA: Diagnosis not present

## 2019-07-28 DIAGNOSIS — Z87891 Personal history of nicotine dependence: Secondary | ICD-10-CM | POA: Diagnosis not present

## 2019-07-28 DIAGNOSIS — R200 Anesthesia of skin: Secondary | ICD-10-CM | POA: Diagnosis not present

## 2019-07-28 DIAGNOSIS — E785 Hyperlipidemia, unspecified: Secondary | ICD-10-CM | POA: Diagnosis not present

## 2019-07-28 DIAGNOSIS — C8593 Non-Hodgkin lymphoma, unspecified, intra-abdominal lymph nodes: Secondary | ICD-10-CM | POA: Diagnosis not present

## 2019-07-28 DIAGNOSIS — M549 Dorsalgia, unspecified: Secondary | ICD-10-CM | POA: Diagnosis not present

## 2019-07-28 NOTE — Telephone Encounter (Signed)
LMOVM need to have labwork to recheck sodium level before appt, does not need appt, can just come in for labs

## 2019-07-29 ENCOUNTER — Other Ambulatory Visit: Payer: Self-pay

## 2019-07-29 ENCOUNTER — Other Ambulatory Visit: Payer: Medicare Other

## 2019-07-29 DIAGNOSIS — E039 Hypothyroidism, unspecified: Secondary | ICD-10-CM

## 2019-07-29 DIAGNOSIS — E871 Hypo-osmolality and hyponatremia: Secondary | ICD-10-CM | POA: Diagnosis not present

## 2019-07-29 DIAGNOSIS — D649 Anemia, unspecified: Secondary | ICD-10-CM

## 2019-07-30 ENCOUNTER — Telehealth: Payer: Self-pay | Admitting: Family Medicine

## 2019-07-30 LAB — BASIC METABOLIC PANEL
BUN/Creatinine Ratio: 15 (ref 12–28)
BUN: 10 mg/dL (ref 8–27)
CO2: 25 mmol/L (ref 20–29)
Calcium: 8.9 mg/dL (ref 8.7–10.3)
Chloride: 96 mmol/L (ref 96–106)
Creatinine, Ser: 0.67 mg/dL (ref 0.57–1.00)
GFR calc Af Amer: 95 mL/min/{1.73_m2} (ref >59)
GFR calc non Af Amer: 83 mL/min/{1.73_m2} (ref >59)
Glucose: 93 mg/dL (ref 65–99)
Potassium: 4 mmol/L (ref 3.5–5.2)
Sodium: 132 mmol/L — ABNORMAL LOW (ref 134–144)

## 2019-07-30 LAB — CBC
Hematocrit: 30.3 % — ABNORMAL LOW (ref 34.0–46.6)
Hemoglobin: 10.1 g/dL — ABNORMAL LOW (ref 11.1–15.9)
MCH: 31.9 pg (ref 26.6–33.0)
MCHC: 33.3 g/dL (ref 31.5–35.7)
MCV: 96 fL (ref 79–97)
Platelets: 382 10*3/uL (ref 150–450)
RBC: 3.17 x10E6/uL — ABNORMAL LOW (ref 3.77–5.28)
RDW: 13.5 % (ref 11.7–15.4)
WBC: 8.7 10*3/uL (ref 3.4–10.8)

## 2019-07-30 LAB — THYROID PANEL WITH TSH
Free Thyroxine Index: 1.6 (ref 1.2–4.9)
T3 Uptake Ratio: 27 % (ref 24–39)
T4, Total: 6 ug/dL (ref 4.5–12.0)
TSH: 3.89 u[IU]/mL (ref 0.450–4.500)

## 2019-08-02 DIAGNOSIS — M1990 Unspecified osteoarthritis, unspecified site: Secondary | ICD-10-CM | POA: Diagnosis not present

## 2019-08-02 DIAGNOSIS — J42 Unspecified chronic bronchitis: Secondary | ICD-10-CM | POA: Diagnosis not present

## 2019-08-02 DIAGNOSIS — Z87891 Personal history of nicotine dependence: Secondary | ICD-10-CM | POA: Diagnosis not present

## 2019-08-02 DIAGNOSIS — G930 Cerebral cysts: Secondary | ICD-10-CM | POA: Diagnosis not present

## 2019-08-02 DIAGNOSIS — M797 Fibromyalgia: Secondary | ICD-10-CM | POA: Diagnosis not present

## 2019-08-02 DIAGNOSIS — S22080D Wedge compression fracture of T11-T12 vertebra, subsequent encounter for fracture with routine healing: Secondary | ICD-10-CM | POA: Diagnosis not present

## 2019-08-02 DIAGNOSIS — K224 Dyskinesia of esophagus: Secondary | ICD-10-CM | POA: Diagnosis not present

## 2019-08-02 DIAGNOSIS — G8929 Other chronic pain: Secondary | ICD-10-CM | POA: Diagnosis not present

## 2019-08-02 DIAGNOSIS — I509 Heart failure, unspecified: Secondary | ICD-10-CM | POA: Diagnosis not present

## 2019-08-02 DIAGNOSIS — R200 Anesthesia of skin: Secondary | ICD-10-CM | POA: Diagnosis not present

## 2019-08-02 DIAGNOSIS — M899 Disorder of bone, unspecified: Secondary | ICD-10-CM | POA: Diagnosis not present

## 2019-08-02 DIAGNOSIS — Z96651 Presence of right artificial knee joint: Secondary | ICD-10-CM | POA: Diagnosis not present

## 2019-08-02 DIAGNOSIS — E785 Hyperlipidemia, unspecified: Secondary | ICD-10-CM | POA: Diagnosis not present

## 2019-08-02 DIAGNOSIS — I480 Paroxysmal atrial fibrillation: Secondary | ICD-10-CM | POA: Diagnosis not present

## 2019-08-02 DIAGNOSIS — I110 Hypertensive heart disease with heart failure: Secondary | ICD-10-CM | POA: Diagnosis not present

## 2019-08-02 DIAGNOSIS — C8593 Non-Hodgkin lymphoma, unspecified, intra-abdominal lymph nodes: Secondary | ICD-10-CM | POA: Diagnosis not present

## 2019-08-02 DIAGNOSIS — M549 Dorsalgia, unspecified: Secondary | ICD-10-CM | POA: Diagnosis not present

## 2019-08-02 DIAGNOSIS — I2510 Atherosclerotic heart disease of native coronary artery without angina pectoris: Secondary | ICD-10-CM | POA: Diagnosis not present

## 2019-08-03 DIAGNOSIS — G930 Cerebral cysts: Secondary | ICD-10-CM | POA: Diagnosis not present

## 2019-08-03 DIAGNOSIS — Z96651 Presence of right artificial knee joint: Secondary | ICD-10-CM | POA: Diagnosis not present

## 2019-08-03 DIAGNOSIS — I110 Hypertensive heart disease with heart failure: Secondary | ICD-10-CM | POA: Diagnosis not present

## 2019-08-03 DIAGNOSIS — K224 Dyskinesia of esophagus: Secondary | ICD-10-CM | POA: Diagnosis not present

## 2019-08-03 DIAGNOSIS — M797 Fibromyalgia: Secondary | ICD-10-CM | POA: Diagnosis not present

## 2019-08-03 DIAGNOSIS — S22080D Wedge compression fracture of T11-T12 vertebra, subsequent encounter for fracture with routine healing: Secondary | ICD-10-CM | POA: Diagnosis not present

## 2019-08-03 DIAGNOSIS — I480 Paroxysmal atrial fibrillation: Secondary | ICD-10-CM | POA: Diagnosis not present

## 2019-08-03 DIAGNOSIS — I2510 Atherosclerotic heart disease of native coronary artery without angina pectoris: Secondary | ICD-10-CM | POA: Diagnosis not present

## 2019-08-03 DIAGNOSIS — E785 Hyperlipidemia, unspecified: Secondary | ICD-10-CM | POA: Diagnosis not present

## 2019-08-03 DIAGNOSIS — M899 Disorder of bone, unspecified: Secondary | ICD-10-CM | POA: Diagnosis not present

## 2019-08-03 DIAGNOSIS — M549 Dorsalgia, unspecified: Secondary | ICD-10-CM | POA: Diagnosis not present

## 2019-08-03 DIAGNOSIS — R200 Anesthesia of skin: Secondary | ICD-10-CM | POA: Diagnosis not present

## 2019-08-03 DIAGNOSIS — C8593 Non-Hodgkin lymphoma, unspecified, intra-abdominal lymph nodes: Secondary | ICD-10-CM | POA: Diagnosis not present

## 2019-08-03 DIAGNOSIS — I509 Heart failure, unspecified: Secondary | ICD-10-CM | POA: Diagnosis not present

## 2019-08-03 DIAGNOSIS — G8929 Other chronic pain: Secondary | ICD-10-CM | POA: Diagnosis not present

## 2019-08-03 DIAGNOSIS — M1990 Unspecified osteoarthritis, unspecified site: Secondary | ICD-10-CM | POA: Diagnosis not present

## 2019-08-03 DIAGNOSIS — Z87891 Personal history of nicotine dependence: Secondary | ICD-10-CM | POA: Diagnosis not present

## 2019-08-03 DIAGNOSIS — J42 Unspecified chronic bronchitis: Secondary | ICD-10-CM | POA: Diagnosis not present

## 2019-08-04 ENCOUNTER — Other Ambulatory Visit: Payer: Self-pay

## 2019-08-05 ENCOUNTER — Encounter: Payer: Self-pay | Admitting: Family Medicine

## 2019-08-05 ENCOUNTER — Ambulatory Visit (INDEPENDENT_AMBULATORY_CARE_PROVIDER_SITE_OTHER): Payer: Medicare Other | Admitting: Family Medicine

## 2019-08-05 VITALS — BP 119/69 | HR 72 | Temp 97.8°F

## 2019-08-05 DIAGNOSIS — G96198 Other disorders of meninges, not elsewhere classified: Secondary | ICD-10-CM

## 2019-08-05 DIAGNOSIS — Z09 Encounter for follow-up examination after completed treatment for conditions other than malignant neoplasm: Secondary | ICD-10-CM

## 2019-08-05 DIAGNOSIS — Z79899 Other long term (current) drug therapy: Secondary | ICD-10-CM | POA: Diagnosis not present

## 2019-08-05 DIAGNOSIS — E039 Hypothyroidism, unspecified: Secondary | ICD-10-CM | POA: Diagnosis not present

## 2019-08-05 DIAGNOSIS — I480 Paroxysmal atrial fibrillation: Secondary | ICD-10-CM

## 2019-08-05 DIAGNOSIS — G9619 Other disorders of meninges, not elsewhere classified: Secondary | ICD-10-CM

## 2019-08-05 DIAGNOSIS — E871 Hypo-osmolality and hyponatremia: Secondary | ICD-10-CM

## 2019-08-05 DIAGNOSIS — F411 Generalized anxiety disorder: Secondary | ICD-10-CM

## 2019-08-05 MED ORDER — ALPRAZOLAM 1 MG PO TABS: 0.5000 mg | ORAL_TABLET | Freq: Two times a day (BID) | ORAL | 3 refills | Status: DC | PRN

## 2019-08-05 NOTE — Patient Instructions (Signed)
Controlled Substance Guidelines:  1. You cannot get an early refill, even it is lost.  2. You cannot get controlled medications from any other doctor, unless it is the emergency department and related to a new problem or injury.  3. You cannot use alcohol, marijuana, cocaine or any other recreational drugs while using this medication. This is very dangerous.  4. You are willing to have your urine drug tested at each visit.  5. You will not drive while using this medication, because that can put yourself and others in serious danger of an accident. 6. If any medication is stolen, then there must be a police report to verify it, or it cannot be refilled.  7. I will not prescribe these medications for longer than 3 months.  8. You must bring your pill bottle to each visit.  9. You must use the same pharmacy for all refills for the medication, unless you clear it with me beforehand.  10. You cannot share or sell this medication.   

## 2019-08-05 NOTE — Progress Notes (Signed)
Subjective: CC: Anxiety disorder/hospital follow-up PCP: Janora Norlander, DO LKT:GYBW [name] is a 83 y.o. female presenting to clinic today for:  1.  Hospital follow-up/ GAD Patient was recently discharged from the hospital at the beginning of August after she had a shunt placed for a spinal arachnoid cyst.  She was initially discharged to a short-term nursing facility at [facility] but has since transitioned back to home.  She is receiving home health occupational therapy and physical therapy twice per week now.  She does feel that this is working well to rehabilitate her.  She continues to take chronic Norco, which she was taking prior to the surgery.  She thinks that she may be able to start going without this and has a follow-up with the nurse practitioner at her spinal surgeons office soon.  She is currently treated with Robaxin as well.  Of note she is treated with alprazolam by her previous PCP for panic and anxiety.  She denies any significant anxiety or panic attacks recently.  She typically uses the alprazolam at bedtime to help calm her mind for sleep.  Denies respiratory depression, falls, confusion or memory loss.  She is also treated with Zoloft.  Medical history is significant for chronic hyponatremia  2.  Atrial fibrillation Patient is compliant with Cardizem and Xarelto.  She denies any GI bleed, abnormal vaginal bleeding or bleeding at her incision site.  No chest pain, shortness of breath.  No falls.  3.  Thyroid disorder Patient reports compliance with Synthroid daily.  She does not report any heart palpitations, change in bowel habits.  ROS: Per HPI  Allergies  Allergen Reactions  . Naproxen Other (See Comments)    Tongue swelling  . Penicillins Swelling    Swelling around site Has patient had a PCN reaction causing immediate rash, facial/tongue/throat swelling, SOB or lightheadedness with hypotension: Yes Has patient had a PCN reaction causing severe rash  involving mucus membranes or skin necrosis: No Has patient had a PCN reaction that required hospitalization: No Has patient had a PCN reaction occurring within the last 10 years: No If all of the above answers are "NO", then may proceed with Cephalosporin use.   . Sulfonamide Derivatives Hives  . Aspirin Other (See Comments)    REACTION: regular strength causes "heart to beat fast"  . Captopril Hypertension  . Strawberry Extract Other (See Comments)  . Clindamycin/Lincomycin Other (See Comments)    unknown   Past Medical History:  Diagnosis Date  . Anxiety   . Arthritis   . Asthmatic bronchitis   . Atrial fibrillation ([place])   . Bowel obstruction (HCC)    blockage  . CAD (coronary artery disease)    Stent to RI 2003.  Myoview 2013 no ischemia.  . Cataract   . Chronic back pain   . Chronic bronchitis ([place])   . Colon polyp    adenomatous  . Congestive heart disease (HCC)    Preserved EF  . Depression   . Esophageal motility disorder   . Fibromyalgia   . GERD (gastroesophageal reflux disease)   . History of hiatal hernia   . History of kidney stones   . Hyperlipidemia   . Hypertension   . Hypothyroid   . 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 Nephrolithiasis   . OA (osteoarthritis)   . Status post dilation of esophageal narrowing     Current Outpatient Medications:  .  albuterol (VENTOLIN HFA)  108 (90 Base) MCG/ACT inhaler, Inhale 2 puffs into the lungs every 4 (four) hours as needed for wheezing or shortness of breath., Disp: 6.7 g, Rfl: 0 .  ALPRAZolam (XANAX) 1 MG tablet, Take 1 tablet (1 mg total) by mouth 2 (two) times daily as needed for anxiety., Disp: 30 tablet, Rfl: 0 .  Ascorbic Acid (VITAMIN C) 1000 MG tablet, Take 1,000 mg by mouth daily., Disp: , Rfl:  .  beta carotene 25000 UNIT capsule, Take 25,000 Units by mouth daily., Disp: , Rfl:  .  budesonide (PULMICORT) 0.5 MG/2ML nebulizer solution, Take 2 mLs (0.5 mg total) by  nebulization 2 (two) times daily as needed., Disp: 120 mL, Rfl: 0 .  calcium carbonate (TUMS - DOSED IN MG ELEMENTAL CALCIUM) 500 MG chewable tablet, Chew 1 tablet by mouth 3 (three) times daily as needed for indigestion or heartburn., Disp: , Rfl:  .  Calcium Carbonate-Vitamin D (CALCIUM 600+D) 600-400 MG-UNIT per tablet, Take 1 tablet by mouth 2 (two) times daily. , Disp: , Rfl:  .  Coenzyme Q10 10 MG capsule, Take 10 mg by mouth daily., Disp: , Rfl:  .  denosumab (PROLIA) 60 MG/ML SOSY injection, Inject 60 mg every 6 (six) months into the skin. Administer in upper arm, thigh, or abdomen, Disp: 1 mL, Rfl: 0 .  dicyclomine (BENTYL) 10 MG capsule, Take 1 capsule (10 mg total) by mouth 4 (four) times daily as needed for spasms., Disp: 30 capsule, Rfl: 0 .  diltiazem (CARDIZEM) 120 MG tablet, Take 1 tablet (120 mg total) by mouth daily., Disp: 30 tablet, Rfl: 0 .  fluticasone (FLONASE) 50 MCG/ACT nasal spray, Place 2 sprays into both nostrils daily., Disp: , Rfl:  .  HYDROcodone-acetaminophen (NORCO/VICODIN) 5-325 MG tablet, Take 1 tablet by mouth every 4 (four) hours as needed for moderate pain., Disp: 10 tablet, Rfl: 0 .  levothyroxine (SYNTHROID) 25 MCG tablet, Take 1 tablet (25 mcg total) by mouth daily before breakfast., Disp: 30 tablet, Rfl: 0 .  loratadine (CLARITIN) 10 MG tablet, Take 1 tablet (10 mg total) by mouth daily., Disp: 90 tablet, Rfl: 1 .  meclizine (ANTIVERT) 25 MG tablet, Take 1 tablet (25 mg total) by mouth 3 (three) times daily as needed for dizziness., Disp: 30 tablet, Rfl: 0 .  Melatonin 5 MG TABS, Take 10 mg by mouth every evening., Disp: , Rfl:  .  metoprolol tartrate (LOPRESSOR) 25 MG tablet, Take 2 tablets (50 mg total) by mouth daily. Take 2 tablets (50 mg) by mouth once daily at 8:00 am, Disp: 60 tablet, Rfl: 0 .  metoprolol tartrate (LOPRESSOR) 25 MG tablet, Take 1 tablet (25 mg total) by mouth every evening., Disp: 30 tablet, Rfl: 0 .  montelukast (SINGULAIR) 10 MG  tablet, Take 1 tablet (10 mg total) by mouth at bedtime., Disp: 30 tablet, Rfl: 0 .  Multiple Vitamin (MULTIVITAMIN) tablet, Take 1 tablet by mouth daily., Disp: , Rfl:  .  nitroGLYCERIN (NITROSTAT) 0.4 MG SL tablet, Place 1 tablet (0.4 mg total) under the tongue every 5 (five) minutes as needed for chest pain., Disp: 30 tablet, Rfl: 0 .  NON FORMULARY, Diet Type: NAS, Disp: , Rfl:  .  omega-3 acid ethyl esters (LOVAZA) 1 g capsule, Take 2 capsules (2 g total) by mouth 2 (two) times daily., Disp: 120 capsule, Rfl: 0 .  omeprazole-sodium bicarbonate (ZEGERID) 40-1100 MG capsule, Take 1 capsule by mouth daily., Disp: 30 capsule, Rfl: 0 .  ondansetron (ZOFRAN) 4 MG tablet,  Take 1 tablet (4 mg total) by mouth every 6 (six) hours as needed for nausea or vomiting., Disp: 20 tablet, Rfl: 0 .  polyethylene glycol (MIRALAX / GLYCOLAX) 17 g packet, Take 17 g by mouth daily., Disp: , Rfl:  .  rivaroxaban (XARELTO) 20 MG TABS tablet, Take 1 tablet (20 mg total) by mouth daily., Disp: 30 tablet, Rfl: 0 .  rosuvastatin (CRESTOR) 20 MG tablet, Take 1 tablet (20 mg total) by mouth daily., Disp: 30 tablet, Rfl: 0 .  sennosides-docusate sodium (SENOKOT-S) 8.6-50 MG tablet, Take 1 tablet by mouth at bedtime., Disp: , Rfl:  .  sertraline (ZOLOFT) 100 MG tablet, Take 1 tablet (100 mg total) by mouth daily., Disp: 30 tablet, Rfl: 0 .  sodium chloride 1 g tablet, Take 2 tablets (2 g total) by mouth 3 (three) times daily., Disp: 180 tablet, Rfl: 0 .  tamsulosin (FLOMAX) 0.4 MG CAPS capsule, Take 1 capsule (0.4 mg total) by mouth daily after breakfast., Disp: 30 capsule, Rfl: 0 Social History   Socioeconomic History  . Marital status: Widowed    Spouse name: Not on file  . Number of children: 2  . Years of education: Not on file  . Highest education level: Not on file  Occupational History  . Occupation: Retired  Scientific laboratory technician  . Financial resource strain: Not on file  . Food insecurity    Worry: Not on file     Inability: Not on file  . Transportation needs    Medical: Not on file    Non-medical: Not on file  Tobacco Use  . Smoking status: Former Smoker    Packs/day: 1.00    Years: 30.00    Pack years: 30.00    Types: Cigarettes    Quit date: 12/16/1989    Years since quitting: 29.6  . Smokeless tobacco: Never Used  . Tobacco comment: smoked off & on  Substance and Sexual Activity  . Alcohol use: No    Alcohol/week: 0.0 standard drinks  . Drug use: No  . Sexual activity: Not Currently  Lifestyle  . Physical activity    Days per week: Not on file    Minutes per session: Not on file  . Stress: Not on file  Relationships  . Social Herbalist on phone: Not on file    Gets together: Not on file    Attends religious service: Not on file    Active member of club or organization: Not on file    Attends meetings of clubs or organizations: Not on file    Relationship status: Not on file  . Intimate partner violence    Fear of current or ex partner: Not on file    Emotionally abused: Not on file    Physically abused: Not on file    Forced sexual activity: Not on file  Other Topics Concern  . Not on file  Social History Narrative   Originally from [place]. Always lived in [place]. No international travel. Has prior travel to [place], [place], [place], [place], [place], & [place]. No recent travel. She has a cat currently. No prior bird, mold, or hot tub exposure. 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 No known asbestos exposure. 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    Family History  Problem Relation Age of Onset  . Prostate cancer Brother   . Cancer Brother  PROSTATE  . Heart disease Mother   . Asthma Mother   . Congestive Heart Failure Mother   . Emphysema Sister   . COPD Sister   . Stroke Sister   . Heart disease Sister   . Emphysema Brother   . Rheumatologic disease Neg Hx     Objective: Office vital signs  reviewed. BP 119/69   Pulse 72   Temp 97.8 F (36.6 C) (Temporal)   Physical Examination:  General: Awake, alert, arrives in wheelchair, No acute distress HEENT: Normal, no exophthalmos.  No goiter. Cardio: regular rate and rhythm, S1S2 heard, no murmurs appreciated Pulm: clear to auscultation bilaterally, no wheezes, rhonchi or rales; normal work of breathing on room air Spine: Well-healed, vertical scar noted extending from the mid thorax to the lower thorax.  No bleeding. Psych: Mood stable, speech normal, affect appropriate, pleasant and interactive. Depression screen [facility] 2/9 08/05/2019 12/14/2018 08/10/2018  Decreased Interest 0 0 0  Down, Depressed, Hopeless 0 0 0  PHQ - 2 Score 0 0 0  Altered sleeping 0 - -  Tired, decreased energy 0 - -  Change in appetite 0 - -  Feeling bad or failure about yourself  1 - -  Trouble concentrating 0 - -  Moving slowly or fidgety/restless 0 - -  Suicidal thoughts 0 - -  PHQ-9 Score 1 - -  Difficult doing work/chores - - -  Some recent data might be hidden   No flowsheet data found.  Assessment/ Plan: 83 y.o. female   1. Hospital discharge follow-up I reviewed what was available through care everywhere.  Check CMP and CBC given chronic anticoagulation - CMP14+EGFR - CBC  2. Spinal arachnoid cyst Status post shunt placement.  She has follow-up with specialist soon.  We discussed that for her chronic low back pain we could trial off of the Norco and rely solely on the muscle relaxer.  We did discuss the risk of alprazolam use with opioids.  Patient notes that she was unaware of this risk and would like to minimize use of opioid medication going forward.  3. Chronic hyponatremia Check sodium level.  Possibly related to use of SSRI but patient with significant anxiety that necessitates benzodiazepine on top of SSRI.  I would hesitate to eliminate the SSRI at this time given mild hyponatremia - CMP14+EGFR  4. Paroxysmal atrial fibrillation  (HCC) Rate controlled.  No evidence of bleeding.  Check CBC - CBC  5. Controlled substance agreement signed Unfortunately could not provide sufficient sample for UDS.  Will obtain this at next visit - ToxASSURE Select 13 (MW), Urine  6. Generalized anxiety disorder Stable.  The national narcotic database was reviewed and there were no red flags.  Refills of the alprazolam have been sent to the pharmacy.  Continue to use sparingly.  Work on getting off of the opioid. - ToxASSURE Select 13 (MW), Urine - ALPRAZolam (XANAX) 1 MG tablet; Take 0.5-1 tablets (0.5-1 mg total) by mouth 2 (two) times daily as needed for anxiety.  Dispense: 60 tablet; Refill: 3  7. Hypothyroidism, unspecified type Currently asymptomatic.  Check thyroid panel. - Thyroid Panel With TSH   No orders of the defined types were placed in this encounter.  No orders of the defined types were placed in this encounter.    Janora Norlander, DO [place] [phone]

## 2019-08-05 NOTE — Telephone Encounter (Signed)
Multiple attempts made to contact patient.  This encounter will now be closed  

## 2019-08-06 ENCOUNTER — Other Ambulatory Visit: Payer: Self-pay | Admitting: Family Medicine

## 2019-08-06 DIAGNOSIS — M797 Fibromyalgia: Secondary | ICD-10-CM | POA: Diagnosis not present

## 2019-08-06 DIAGNOSIS — G8929 Other chronic pain: Secondary | ICD-10-CM | POA: Diagnosis not present

## 2019-08-06 DIAGNOSIS — I480 Paroxysmal atrial fibrillation: Secondary | ICD-10-CM | POA: Diagnosis not present

## 2019-08-06 DIAGNOSIS — Z96651 Presence of right artificial knee joint: Secondary | ICD-10-CM | POA: Diagnosis not present

## 2019-08-06 DIAGNOSIS — I2510 Atherosclerotic heart disease of native coronary artery without angina pectoris: Secondary | ICD-10-CM | POA: Diagnosis not present

## 2019-08-06 DIAGNOSIS — I509 Heart failure, unspecified: Secondary | ICD-10-CM | POA: Diagnosis not present

## 2019-08-06 DIAGNOSIS — J42 Unspecified chronic bronchitis: Secondary | ICD-10-CM | POA: Diagnosis not present

## 2019-08-06 DIAGNOSIS — K224 Dyskinesia of esophagus: Secondary | ICD-10-CM | POA: Diagnosis not present

## 2019-08-06 DIAGNOSIS — R200 Anesthesia of skin: Secondary | ICD-10-CM | POA: Diagnosis not present

## 2019-08-06 DIAGNOSIS — G930 Cerebral cysts: Secondary | ICD-10-CM | POA: Diagnosis not present

## 2019-08-06 DIAGNOSIS — S22080D Wedge compression fracture of T11-T12 vertebra, subsequent encounter for fracture with routine healing: Secondary | ICD-10-CM | POA: Diagnosis not present

## 2019-08-06 DIAGNOSIS — I110 Hypertensive heart disease with heart failure: Secondary | ICD-10-CM | POA: Diagnosis not present

## 2019-08-06 DIAGNOSIS — M549 Dorsalgia, unspecified: Secondary | ICD-10-CM | POA: Diagnosis not present

## 2019-08-06 DIAGNOSIS — M899 Disorder of bone, unspecified: Secondary | ICD-10-CM | POA: Diagnosis not present

## 2019-08-06 DIAGNOSIS — E785 Hyperlipidemia, unspecified: Secondary | ICD-10-CM | POA: Diagnosis not present

## 2019-08-06 DIAGNOSIS — Z87891 Personal history of nicotine dependence: Secondary | ICD-10-CM | POA: Diagnosis not present

## 2019-08-06 DIAGNOSIS — C8593 Non-Hodgkin lymphoma, unspecified, intra-abdominal lymph nodes: Secondary | ICD-10-CM | POA: Diagnosis not present

## 2019-08-06 DIAGNOSIS — M1990 Unspecified osteoarthritis, unspecified site: Secondary | ICD-10-CM | POA: Diagnosis not present

## 2019-08-06 LAB — CMP14+EGFR
ALT: 9 IU/L (ref 0–32)
AST: 23 IU/L (ref 0–40)
Albumin/Globulin Ratio: 0.9 — ABNORMAL LOW (ref 1.2–2.2)
Albumin: 4 g/dL (ref 3.6–4.6)
Alkaline Phosphatase: 104 IU/L (ref 39–117)
BUN/Creatinine Ratio: 11 — ABNORMAL LOW (ref 12–28)
BUN: 9 mg/dL (ref 8–27)
Bilirubin Total: 0.3 mg/dL (ref 0.0–1.2)
CO2: 24 mmol/L (ref 20–29)
Calcium: 9.2 mg/dL (ref 8.7–10.3)
Chloride: 92 mmol/L — ABNORMAL LOW (ref 96–106)
Creatinine, Ser: 0.82 mg/dL (ref 0.57–1.00)
GFR calc Af Amer: 78 mL/min/{1.73_m2} (ref >59)
GFR calc non Af Amer: 67 mL/min/{1.73_m2} (ref >59)
Globulin, Total: 4.3 g/dL (ref 1.5–4.5)
Glucose: 87 mg/dL (ref 65–99)
Potassium: 5 mmol/L (ref 3.5–5.2)
Sodium: 129 mmol/L — ABNORMAL LOW (ref 134–144)
Total Protein: 8.3 g/dL (ref 6.0–8.5)

## 2019-08-06 LAB — THYROID PANEL WITH TSH
Free Thyroxine Index: 1.9 (ref 1.2–4.9)
T3 Uptake Ratio: 28 % (ref 24–39)
T4, Total: 6.7 ug/dL (ref 4.5–12.0)
TSH: 2.29 u[IU]/mL (ref 0.450–4.500)

## 2019-08-06 LAB — CBC
Hematocrit: 32 % — ABNORMAL LOW (ref 34.0–46.6)
Hemoglobin: 10.7 g/dL — ABNORMAL LOW (ref 11.1–15.9)
MCH: 31.8 pg (ref 26.6–33.0)
MCHC: 33.4 g/dL (ref 31.5–35.7)
MCV: 95 fL (ref 79–97)
Platelets: 313 10*3/uL (ref 150–450)
RBC: 3.36 x10E6/uL — ABNORMAL LOW (ref 3.77–5.28)
RDW: 13 % (ref 11.7–15.4)
WBC: 8.4 10*3/uL (ref 3.4–10.8)

## 2019-08-06 MED ORDER — LEVOTHYROXINE SODIUM 25 MCG PO TABS: 25.0000 ug | ORAL_TABLET | Freq: Every day | ORAL | 5 refills | Status: DC

## 2019-08-10 DIAGNOSIS — S22080D Wedge compression fracture of T11-T12 vertebra, subsequent encounter for fracture with routine healing: Secondary | ICD-10-CM | POA: Diagnosis not present

## 2019-08-10 DIAGNOSIS — Z87891 Personal history of nicotine dependence: Secondary | ICD-10-CM | POA: Diagnosis not present

## 2019-08-10 DIAGNOSIS — E785 Hyperlipidemia, unspecified: Secondary | ICD-10-CM | POA: Diagnosis not present

## 2019-08-10 DIAGNOSIS — M797 Fibromyalgia: Secondary | ICD-10-CM | POA: Diagnosis not present

## 2019-08-10 DIAGNOSIS — I2510 Atherosclerotic heart disease of native coronary artery without angina pectoris: Secondary | ICD-10-CM | POA: Diagnosis not present

## 2019-08-10 DIAGNOSIS — M1990 Unspecified osteoarthritis, unspecified site: Secondary | ICD-10-CM | POA: Diagnosis not present

## 2019-08-10 DIAGNOSIS — Z96651 Presence of right artificial knee joint: Secondary | ICD-10-CM | POA: Diagnosis not present

## 2019-08-10 DIAGNOSIS — M899 Disorder of bone, unspecified: Secondary | ICD-10-CM | POA: Diagnosis not present

## 2019-08-10 DIAGNOSIS — I509 Heart failure, unspecified: Secondary | ICD-10-CM | POA: Diagnosis not present

## 2019-08-10 DIAGNOSIS — G8929 Other chronic pain: Secondary | ICD-10-CM | POA: Diagnosis not present

## 2019-08-10 DIAGNOSIS — M549 Dorsalgia, unspecified: Secondary | ICD-10-CM | POA: Diagnosis not present

## 2019-08-10 DIAGNOSIS — G930 Cerebral cysts: Secondary | ICD-10-CM | POA: Diagnosis not present

## 2019-08-10 DIAGNOSIS — R200 Anesthesia of skin: Secondary | ICD-10-CM | POA: Diagnosis not present

## 2019-08-10 DIAGNOSIS — I110 Hypertensive heart disease with heart failure: Secondary | ICD-10-CM | POA: Diagnosis not present

## 2019-08-10 DIAGNOSIS — I480 Paroxysmal atrial fibrillation: Secondary | ICD-10-CM | POA: Diagnosis not present

## 2019-08-10 DIAGNOSIS — C8593 Non-Hodgkin lymphoma, unspecified, intra-abdominal lymph nodes: Secondary | ICD-10-CM | POA: Diagnosis not present

## 2019-08-10 DIAGNOSIS — J42 Unspecified chronic bronchitis: Secondary | ICD-10-CM | POA: Diagnosis not present

## 2019-08-10 DIAGNOSIS — K224 Dyskinesia of esophagus: Secondary | ICD-10-CM | POA: Diagnosis not present

## 2019-08-11 DIAGNOSIS — J42 Unspecified chronic bronchitis: Secondary | ICD-10-CM | POA: Diagnosis not present

## 2019-08-11 DIAGNOSIS — K224 Dyskinesia of esophagus: Secondary | ICD-10-CM | POA: Diagnosis not present

## 2019-08-11 DIAGNOSIS — S22080D Wedge compression fracture of T11-T12 vertebra, subsequent encounter for fracture with routine healing: Secondary | ICD-10-CM | POA: Diagnosis not present

## 2019-08-11 DIAGNOSIS — M549 Dorsalgia, unspecified: Secondary | ICD-10-CM | POA: Diagnosis not present

## 2019-08-11 DIAGNOSIS — I110 Hypertensive heart disease with heart failure: Secondary | ICD-10-CM | POA: Diagnosis not present

## 2019-08-11 DIAGNOSIS — I509 Heart failure, unspecified: Secondary | ICD-10-CM | POA: Diagnosis not present

## 2019-08-11 DIAGNOSIS — I2510 Atherosclerotic heart disease of native coronary artery without angina pectoris: Secondary | ICD-10-CM | POA: Diagnosis not present

## 2019-08-11 DIAGNOSIS — G8929 Other chronic pain: Secondary | ICD-10-CM | POA: Diagnosis not present

## 2019-08-11 DIAGNOSIS — Z87891 Personal history of nicotine dependence: Secondary | ICD-10-CM | POA: Diagnosis not present

## 2019-08-11 DIAGNOSIS — M797 Fibromyalgia: Secondary | ICD-10-CM | POA: Diagnosis not present

## 2019-08-11 DIAGNOSIS — G930 Cerebral cysts: Secondary | ICD-10-CM | POA: Diagnosis not present

## 2019-08-11 DIAGNOSIS — E785 Hyperlipidemia, unspecified: Secondary | ICD-10-CM | POA: Diagnosis not present

## 2019-08-11 DIAGNOSIS — M899 Disorder of bone, unspecified: Secondary | ICD-10-CM | POA: Diagnosis not present

## 2019-08-11 DIAGNOSIS — Z96651 Presence of right artificial knee joint: Secondary | ICD-10-CM | POA: Diagnosis not present

## 2019-08-11 DIAGNOSIS — C8593 Non-Hodgkin lymphoma, unspecified, intra-abdominal lymph nodes: Secondary | ICD-10-CM | POA: Diagnosis not present

## 2019-08-11 DIAGNOSIS — I480 Paroxysmal atrial fibrillation: Secondary | ICD-10-CM | POA: Diagnosis not present

## 2019-08-11 DIAGNOSIS — R200 Anesthesia of skin: Secondary | ICD-10-CM | POA: Diagnosis not present

## 2019-08-11 DIAGNOSIS — M1990 Unspecified osteoarthritis, unspecified site: Secondary | ICD-10-CM | POA: Diagnosis not present

## 2019-08-12 DIAGNOSIS — I110 Hypertensive heart disease with heart failure: Secondary | ICD-10-CM | POA: Diagnosis not present

## 2019-08-12 DIAGNOSIS — R200 Anesthesia of skin: Secondary | ICD-10-CM | POA: Diagnosis not present

## 2019-08-12 DIAGNOSIS — M549 Dorsalgia, unspecified: Secondary | ICD-10-CM | POA: Diagnosis not present

## 2019-08-12 DIAGNOSIS — M1990 Unspecified osteoarthritis, unspecified site: Secondary | ICD-10-CM | POA: Diagnosis not present

## 2019-08-12 DIAGNOSIS — G930 Cerebral cysts: Secondary | ICD-10-CM | POA: Diagnosis not present

## 2019-08-12 DIAGNOSIS — Z87891 Personal history of nicotine dependence: Secondary | ICD-10-CM | POA: Diagnosis not present

## 2019-08-12 DIAGNOSIS — J42 Unspecified chronic bronchitis: Secondary | ICD-10-CM | POA: Diagnosis not present

## 2019-08-12 DIAGNOSIS — I2510 Atherosclerotic heart disease of native coronary artery without angina pectoris: Secondary | ICD-10-CM | POA: Diagnosis not present

## 2019-08-12 DIAGNOSIS — C8593 Non-Hodgkin lymphoma, unspecified, intra-abdominal lymph nodes: Secondary | ICD-10-CM | POA: Diagnosis not present

## 2019-08-12 DIAGNOSIS — S22080D Wedge compression fracture of T11-T12 vertebra, subsequent encounter for fracture with routine healing: Secondary | ICD-10-CM | POA: Diagnosis not present

## 2019-08-12 DIAGNOSIS — M797 Fibromyalgia: Secondary | ICD-10-CM | POA: Diagnosis not present

## 2019-08-12 DIAGNOSIS — I509 Heart failure, unspecified: Secondary | ICD-10-CM | POA: Diagnosis not present

## 2019-08-12 DIAGNOSIS — K224 Dyskinesia of esophagus: Secondary | ICD-10-CM | POA: Diagnosis not present

## 2019-08-12 DIAGNOSIS — E785 Hyperlipidemia, unspecified: Secondary | ICD-10-CM | POA: Diagnosis not present

## 2019-08-12 DIAGNOSIS — Z96651 Presence of right artificial knee joint: Secondary | ICD-10-CM | POA: Diagnosis not present

## 2019-08-12 DIAGNOSIS — M899 Disorder of bone, unspecified: Secondary | ICD-10-CM | POA: Diagnosis not present

## 2019-08-12 DIAGNOSIS — G8929 Other chronic pain: Secondary | ICD-10-CM | POA: Diagnosis not present

## 2019-08-12 DIAGNOSIS — I480 Paroxysmal atrial fibrillation: Secondary | ICD-10-CM | POA: Diagnosis not present

## 2019-08-13 DIAGNOSIS — M899 Disorder of bone, unspecified: Secondary | ICD-10-CM | POA: Diagnosis not present

## 2019-08-13 DIAGNOSIS — R200 Anesthesia of skin: Secondary | ICD-10-CM | POA: Diagnosis not present

## 2019-08-13 DIAGNOSIS — G8929 Other chronic pain: Secondary | ICD-10-CM | POA: Diagnosis not present

## 2019-08-13 DIAGNOSIS — I2510 Atherosclerotic heart disease of native coronary artery without angina pectoris: Secondary | ICD-10-CM | POA: Diagnosis not present

## 2019-08-13 DIAGNOSIS — E785 Hyperlipidemia, unspecified: Secondary | ICD-10-CM | POA: Diagnosis not present

## 2019-08-13 DIAGNOSIS — M797 Fibromyalgia: Secondary | ICD-10-CM | POA: Diagnosis not present

## 2019-08-13 DIAGNOSIS — M549 Dorsalgia, unspecified: Secondary | ICD-10-CM | POA: Diagnosis not present

## 2019-08-13 DIAGNOSIS — I509 Heart failure, unspecified: Secondary | ICD-10-CM | POA: Diagnosis not present

## 2019-08-13 DIAGNOSIS — Z96651 Presence of right artificial knee joint: Secondary | ICD-10-CM | POA: Diagnosis not present

## 2019-08-13 DIAGNOSIS — I480 Paroxysmal atrial fibrillation: Secondary | ICD-10-CM | POA: Diagnosis not present

## 2019-08-13 DIAGNOSIS — S22080D Wedge compression fracture of T11-T12 vertebra, subsequent encounter for fracture with routine healing: Secondary | ICD-10-CM | POA: Diagnosis not present

## 2019-08-13 DIAGNOSIS — Z87891 Personal history of nicotine dependence: Secondary | ICD-10-CM | POA: Diagnosis not present

## 2019-08-13 DIAGNOSIS — K224 Dyskinesia of esophagus: Secondary | ICD-10-CM | POA: Diagnosis not present

## 2019-08-13 DIAGNOSIS — J42 Unspecified chronic bronchitis: Secondary | ICD-10-CM | POA: Diagnosis not present

## 2019-08-13 DIAGNOSIS — G930 Cerebral cysts: Secondary | ICD-10-CM | POA: Diagnosis not present

## 2019-08-13 DIAGNOSIS — I110 Hypertensive heart disease with heart failure: Secondary | ICD-10-CM | POA: Diagnosis not present

## 2019-08-13 DIAGNOSIS — C8593 Non-Hodgkin lymphoma, unspecified, intra-abdominal lymph nodes: Secondary | ICD-10-CM | POA: Diagnosis not present

## 2019-08-13 DIAGNOSIS — M1990 Unspecified osteoarthritis, unspecified site: Secondary | ICD-10-CM | POA: Diagnosis not present

## 2019-08-17 ENCOUNTER — Telehealth: Payer: Self-pay | Admitting: Family Medicine

## 2019-08-17 ENCOUNTER — Telehealth: Payer: Self-pay | Admitting: Pulmonary Disease

## 2019-08-17 NOTE — Telephone Encounter (Signed)
Pt wants to use [facility] to order her nebulizer meds, FY!

## 2019-08-17 NOTE — Telephone Encounter (Signed)
Spoke with a representative from [facility] and she was requesting the chart notes for Medicare. As I looked at her chart she has not been here since 2018 and the notes needed to be at least a year old. I called pt to let her know that she needed an office visit. She stated she had spinal surgery and is in a lot of pain. I offered her a televisit with TP. She made an appt for Thursday, 9/3 at 10:30am. Nothing further is needed.

## 2019-08-19 ENCOUNTER — Ambulatory Visit (INDEPENDENT_AMBULATORY_CARE_PROVIDER_SITE_OTHER): Payer: Medicare Other | Admitting: Adult Health

## 2019-08-19 ENCOUNTER — Encounter: Payer: Self-pay | Admitting: Adult Health

## 2019-08-19 ENCOUNTER — Other Ambulatory Visit: Payer: Self-pay

## 2019-08-19 ENCOUNTER — Telehealth: Payer: Self-pay | Admitting: Family Medicine

## 2019-08-19 DIAGNOSIS — J4530 Mild persistent asthma, uncomplicated: Secondary | ICD-10-CM | POA: Diagnosis not present

## 2019-08-19 DIAGNOSIS — J310 Chronic rhinitis: Secondary | ICD-10-CM | POA: Diagnosis not present

## 2019-08-19 NOTE — Patient Instructions (Addendum)
Continue on Budesonide Neb Twice daily  .  Continue on Singulair daily  Continue on Flonase 2 puffs daily Continue on Claritin 10 mg daily Follow up with Dr. Elsworth Soho  In 6 months and As needed   Please contact office for sooner follow up if symptoms do not improve or worsen or seek emergency care

## 2019-08-19 NOTE — Telephone Encounter (Signed)
Request sent to cathy holt to review

## 2019-08-19 NOTE — Progress Notes (Signed)
Virtual Visit via Telephone Note  I connected with [name] on 08/19/19 at 10:30 AM EDT by telephone and verified that I am speaking with the correct person using two identifiers.  Location: Patient: Home  Provider: Office    I discussed the limitations, risks, security and privacy concerns of performing an evaluation and management service by telephone and the availability of in person appointments. I also discussed with the patient that there may be a patient responsible charge related to this service. The patient expressed understanding and agreed to proceed.   History of Present Illness: 83 year-old female former smoker followed for asthma and chronic cough (VCD)  . Complicated by GERD and chronic rhinitis History of a complicated pleural effusion. Status post a right VATS Has A Fib on Xarelto   Today's tele-visit is a follow-up for asthma. Patient was last seen in 2018.  Patient says overall she is doing okay with her breathing.  She remains on Pulmicort nebulizer twice daily.  She is on Singulair daily.  Uses Flonase and Claritin daily  She says she has had multiple medical issues over the last several months.  She recently had back surgery for an epidural cyst.  She is says she is recovering at home.  Has home health is helping her.  She is also been diagnosed with non-Hodgkin's lymphoma.  She is followed by [facility].  She is currently on serial CT scans with 1 planned tomorrow.  She is on no active treatment.  She says she has no increased cough or wheezing.  Does get short of breath with activity but seems to be at her baseline.  She denies any increased wheezing or cough..   Observations/Objective:   Assessment and Plan: Mild persistent asthma, chronic cough currently stable and under control. Continue on current regimen.  Chronic rhinitis.  Appears stable. Continue on current regimen  Plan  Patient Instructions  Continue on Budesonide Neb Twice daily  .  Continue  on Singulair daily  Continue on Flonase 2 puffs daily Continue on Claritin 10 mg daily Follow up with Dr. Elsworth Soho  In 6 months and As needed   Please contact office for sooner follow up if symptoms do not improve or worsen or seek emergency care         Follow Up Instructions: Follow-up with Dr. Elsworth Soho in 6 months and as needed   I discussed the assessment and treatment plan with the patient. The patient was provided an opportunity to ask questions and all were answered. The patient agreed with the plan and demonstrated an understanding of the instructions.   The patient was advised to call back or seek an in-person evaluation if the symptoms worsen or if the condition fails to improve as anticipated.  I provided 22 minutes of non-face-to-face time during this encounter.   Rexene Edison, NP

## 2019-08-25 ENCOUNTER — Other Ambulatory Visit: Payer: Self-pay

## 2019-08-25 ENCOUNTER — Ambulatory Visit (INDEPENDENT_AMBULATORY_CARE_PROVIDER_SITE_OTHER): Payer: Medicare Other

## 2019-08-25 DIAGNOSIS — Z96651 Presence of right artificial knee joint: Secondary | ICD-10-CM

## 2019-08-25 DIAGNOSIS — I480 Paroxysmal atrial fibrillation: Secondary | ICD-10-CM

## 2019-08-25 DIAGNOSIS — R200 Anesthesia of skin: Secondary | ICD-10-CM

## 2019-08-25 DIAGNOSIS — M899 Disorder of bone, unspecified: Secondary | ICD-10-CM

## 2019-08-25 DIAGNOSIS — M549 Dorsalgia, unspecified: Secondary | ICD-10-CM

## 2019-08-25 DIAGNOSIS — S22080D Wedge compression fracture of T11-T12 vertebra, subsequent encounter for fracture with routine healing: Secondary | ICD-10-CM | POA: Diagnosis not present

## 2019-08-25 DIAGNOSIS — C8593 Non-Hodgkin lymphoma, unspecified, intra-abdominal lymph nodes: Secondary | ICD-10-CM

## 2019-08-25 DIAGNOSIS — F329 Major depressive disorder, single episode, unspecified: Secondary | ICD-10-CM

## 2019-08-25 DIAGNOSIS — J42 Unspecified chronic bronchitis: Secondary | ICD-10-CM

## 2019-08-25 DIAGNOSIS — E785 Hyperlipidemia, unspecified: Secondary | ICD-10-CM

## 2019-08-25 DIAGNOSIS — I2510 Atherosclerotic heart disease of native coronary artery without angina pectoris: Secondary | ICD-10-CM

## 2019-08-25 DIAGNOSIS — G930 Cerebral cysts: Secondary | ICD-10-CM

## 2019-08-25 DIAGNOSIS — G8929 Other chronic pain: Secondary | ICD-10-CM | POA: Diagnosis not present

## 2019-08-25 DIAGNOSIS — I509 Heart failure, unspecified: Secondary | ICD-10-CM

## 2019-08-25 DIAGNOSIS — M797 Fibromyalgia: Secondary | ICD-10-CM

## 2019-08-25 DIAGNOSIS — Z87891 Personal history of nicotine dependence: Secondary | ICD-10-CM

## 2019-08-25 DIAGNOSIS — M1990 Unspecified osteoarthritis, unspecified site: Secondary | ICD-10-CM

## 2019-08-25 DIAGNOSIS — K224 Dyskinesia of esophagus: Secondary | ICD-10-CM

## 2019-08-25 DIAGNOSIS — I110 Hypertensive heart disease with heart failure: Secondary | ICD-10-CM

## 2019-08-31 ENCOUNTER — Other Ambulatory Visit: Payer: Self-pay | Admitting: Adult Health

## 2019-08-31 ENCOUNTER — Telehealth: Payer: Self-pay | Admitting: Pulmonary Disease

## 2019-08-31 ENCOUNTER — Telehealth: Payer: Self-pay | Admitting: *Deleted

## 2019-08-31 NOTE — Telephone Encounter (Signed)
Form was received today and placed in [name]'s inbox since it was a CMN. Will fax back once RA's has signed it.

## 2019-08-31 NOTE — Telephone Encounter (Signed)
FYI per [facility] protocol Pt reported she slide off bed week ago, then crawled to her recliner to get up, not hurt just sore and getting better Working on stretching excercises

## 2019-08-31 NOTE — Telephone Encounter (Signed)
Ok. Thank you.

## 2019-09-01 ENCOUNTER — Telehealth: Payer: Self-pay | Admitting: Pulmonary Disease

## 2019-09-01 ENCOUNTER — Other Ambulatory Visit: Payer: Self-pay | Admitting: *Deleted

## 2019-09-01 MED ORDER — BUDESONIDE 0.5 MG/2ML IN SUSP: 0.5000 mg | Freq: Two times a day (BID) | RESPIRATORY_TRACT | 1 refills | Status: DC | PRN

## 2019-09-01 MED ORDER — BUDESONIDE 0.5 MG/2ML IN SUSP: 0.5000 mg | Freq: Two times a day (BID) | RESPIRATORY_TRACT | 1 refills | Status: AC

## 2019-09-01 NOTE — Telephone Encounter (Signed)
Spoke with pharmacist. She stated that the RX that was sent over needed to have the PRN removed before insurance would pay for it. Advised her that I would re-sent the RX.   Nothing further needed at time of call.

## 2019-09-01 NOTE — Telephone Encounter (Signed)
Medication has been sent in. Called [facility] to speak with Cyndi Bender to make her aware but she was not available.   Will close this encounter.

## 2019-09-01 NOTE — Telephone Encounter (Signed)
I cannot see the requested medication.  Please send to me again.

## 2019-09-02 ENCOUNTER — Other Ambulatory Visit: Payer: Self-pay | Admitting: Family Medicine

## 2019-09-02 ENCOUNTER — Other Ambulatory Visit: Payer: Self-pay

## 2019-09-02 MED ORDER — SERTRALINE HCL 100 MG PO TABS: 100.0000 mg | ORAL_TABLET | Freq: Every day | ORAL | 0 refills | Status: DC

## 2019-09-02 NOTE — Patient Outreach (Signed)
[facility]) Care Management  09/02/2019  [name] [birth year] [id number]   Medication Adherence call to [name] spoke with patient she is past due on Losartan 100 mg patient explain she is no longer taking this medication she is now taking Metoprolol patient also had questions on how to order or receive frozen meals she had then before, this work for her and wants to know where can she get then, patient will be refer to Kevin(rph) for father assistance on frozen meals. [name] is showing past due under [facility].   [facility] Direct Dial [phone]  Fax [phone] [email or web address]

## 2019-09-08 ENCOUNTER — Telehealth: Payer: Self-pay | Admitting: Family Medicine

## 2019-09-08 NOTE — Telephone Encounter (Signed)
Yes, please have her get seen and come in for xrays to look at back.

## 2019-09-08 NOTE — Telephone Encounter (Signed)
Or could she come into this office for Xrays

## 2019-09-08 NOTE — Telephone Encounter (Signed)
Patient states that she has had 2 falls since spinal surgery and urinary incontinence.  Surgery done 7/29

## 2019-09-08 NOTE — Telephone Encounter (Signed)
If she cannot be seen by neurosx and is not willing to get checked in ED, have her see ANY provider ASAP.

## 2019-09-08 NOTE — Telephone Encounter (Signed)
Agree with reaching out to surgeon but it sounds like she needs to get seen ASAP.  Consider eval in ED for repeat MRI given falls/ urinary symptpms

## 2019-09-09 ENCOUNTER — Ambulatory Visit (INDEPENDENT_AMBULATORY_CARE_PROVIDER_SITE_OTHER): Payer: Medicare Other | Admitting: Nurse Practitioner

## 2019-09-09 ENCOUNTER — Other Ambulatory Visit: Payer: Self-pay

## 2019-09-09 ENCOUNTER — Encounter: Payer: Self-pay | Admitting: Nurse Practitioner

## 2019-09-09 ENCOUNTER — Ambulatory Visit (INDEPENDENT_AMBULATORY_CARE_PROVIDER_SITE_OTHER): Payer: Medicare Other

## 2019-09-09 VITALS — BP 132/68 | HR 65 | Temp 96.9°F | Ht 59.0 in | Wt 143.0 lb

## 2019-09-09 DIAGNOSIS — M549 Dorsalgia, unspecified: Secondary | ICD-10-CM

## 2019-09-09 DIAGNOSIS — E871 Hypo-osmolality and hyponatremia: Secondary | ICD-10-CM

## 2019-09-09 DIAGNOSIS — Z23 Encounter for immunization: Secondary | ICD-10-CM | POA: Diagnosis not present

## 2019-09-09 MED ORDER — PREDNISONE 20 MG PO TABS: ORAL_TABLET | ORAL | 0 refills | Status: DC

## 2019-09-09 NOTE — Telephone Encounter (Signed)
Apt scheduled.  

## 2019-09-09 NOTE — Progress Notes (Signed)
Subjective:    Patient ID: [name], female    DOB: 1936-09-01, 83 y.o.   MRN: [id number]   Chief Complaint: Back Pain   HPI Patient come sin today c/o back pain. She had back surgery April 29. She was doing well until lately. She has fallen 2 times in the last 2 weeks. She slipped out of bed last Friday. Her back has been hurting every since. Mainly in the uppr part of back and to mid back.she rates pain 9-10/10. She says sitting on lift chair helps. She spoke with her surgeon and he said for her to come he and have xrays.  * was suppose to have sodium level repeated several weeks ago and did not come in to have done  Review of Systems  Constitutional: Negative for activity change and appetite change.  HENT: Negative.   Eyes: Negative for pain.  Respiratory: Negative for shortness of breath.   Cardiovascular: Negative for chest pain, palpitations and leg swelling.  Gastrointestinal: Negative for abdominal pain.  Endocrine: Negative for polydipsia.  Genitourinary: Negative.   Musculoskeletal: Positive for back pain (upper back).  Skin: Negative for rash.  Neurological: Negative for dizziness, weakness and headaches.  Hematological: Does not bruise/bleed easily.  Psychiatric/Behavioral: Negative.   All other systems reviewed and are negative.      Objective:   Physical Exam Vitals signs and nursing note reviewed.  Constitutional:      General: She is not in acute distress.    Appearance: Normal appearance. She is well-developed.  HENT:     Head: Normocephalic.     Nose: Nose normal.  Eyes:     Pupils: Pupils are equal, round, and reactive to light.  Neck:     Musculoskeletal: Normal range of motion and neck supple.     Vascular: No carotid bruit or JVD.  Cardiovascular:     Rate and Rhythm: Normal rate and regular rhythm.     Heart sounds: Normal heart sounds.  Pulmonary:     Effort: Pulmonary effort is normal. No respiratory distress.     Breath sounds: Normal  breath sounds. No wheezing or rales.  Chest:     Chest wall: No tenderness.  Abdominal:     General: Bowel sounds are normal. There is no distension or abdominal bruit.     Palpations: Abdomen is soft. There is no hepatomegaly, splenomegaly, mass or pulsatile mass.     Tenderness: There is no abdominal tenderness.  Musculoskeletal: Normal range of motion.     Comments: FROM of thoracic spine with pain on twisting grips equal bil  Lymphadenopathy:     Cervical: No cervical adenopathy.  Skin:    General: Skin is warm and dry.  Neurological:     General: No focal deficit present.     Mental Status: She is alert and oriented to person, place, and time.     Deep Tendon Reflexes: Reflexes are normal and symmetric. Reflexes normal.  Psychiatric:        Behavior: Behavior normal.        Thought Content: Thought content normal.        Judgment: Judgment normal.     BP 132/68   Pulse 65   Temp (!) 96.9 F (36.1 C) (Oral)   Ht '4\' 11"'  (1.499 m)   Wt 143 lb (64.9 kg)   BMI 28.88 kg/m   Thoracic xray- no acute problems noted-Preliminary reading by Ronnald Collum, FNP  [facility]  Assessment & Plan:  LARYSA PALL in today with chief complaint of Back Pain   1. Upper back pain Moist heat rest - DG Thoracic Spine 2 View; Future - predniSONE (DELTASONE) 20 MG tablet; 2 po at sametime daily for 5 days  Dispense: 10 tablet; Refill: 0  2. Hyponatremia Labs pending - BMP8+EGFR  Mary-Margaret Hassell Done, FNP

## 2019-09-10 LAB — BMP8+EGFR
BUN/Creatinine Ratio: 14 (ref 12–28)
BUN: 12 mg/dL (ref 8–27)
CO2: 26 mmol/L (ref 20–29)
Calcium: 9.2 mg/dL (ref 8.7–10.3)
Chloride: 97 mmol/L (ref 96–106)
Creatinine, Ser: 0.86 mg/dL (ref 0.57–1.00)
GFR calc Af Amer: 73 mL/min/{1.73_m2} (ref >59)
GFR calc non Af Amer: 64 mL/min/{1.73_m2} (ref >59)
Glucose: 101 mg/dL — ABNORMAL HIGH (ref 65–99)
Potassium: 4.5 mmol/L (ref 3.5–5.2)
Sodium: 134 mmol/L (ref 134–144)

## 2019-10-28 ENCOUNTER — Telehealth: Payer: Self-pay | Admitting: Physician Assistant

## 2019-10-28 ENCOUNTER — Other Ambulatory Visit: Payer: Self-pay | Admitting: Family Medicine

## 2019-10-28 ENCOUNTER — Other Ambulatory Visit: Payer: Self-pay | Admitting: Physician Assistant

## 2019-10-28 MED ORDER — OMEPRAZOLE-SODIUM BICARBONATE 40-1100 MG PO CAPS: 1.0000 | ORAL_CAPSULE | Freq: Every day | ORAL | 0 refills | Status: DC

## 2019-10-28 NOTE — Telephone Encounter (Signed)
Spoke with patient and informed her that she has not been seen in 2 years and she got very upset. I informed her that her PCP seems to be the one who was filling it for her now. She denied stating Dr. Olevia Perches put her on the medication years ago and it is the only thing that works  For her. I informed her I will need to make an appointment to follow up she states she is having hard time getting around right now due to a recent surgery but will schedule after Christmas holiday to come back in. I told her we do not have the schedule out that far but she can call back to schedule around the 1st of next month. She agreed and verbalized understanding.

## 2019-11-17 ENCOUNTER — Ambulatory Visit: Payer: Medicare Other | Attending: Chiropractic Medicine | Admitting: Physical Therapy

## 2019-11-24 ENCOUNTER — Ambulatory Visit (INDEPENDENT_AMBULATORY_CARE_PROVIDER_SITE_OTHER): Payer: Medicare Other

## 2019-11-24 ENCOUNTER — Other Ambulatory Visit: Payer: Self-pay

## 2019-11-24 ENCOUNTER — Ambulatory Visit (INDEPENDENT_AMBULATORY_CARE_PROVIDER_SITE_OTHER): Payer: Medicare Other | Admitting: Family Medicine

## 2019-11-24 ENCOUNTER — Encounter: Payer: Self-pay | Admitting: Family Medicine

## 2019-11-24 VITALS — BP 142/68 | HR 60 | Temp 99.3°F

## 2019-11-24 DIAGNOSIS — W19XXXA Unspecified fall, initial encounter: Secondary | ICD-10-CM

## 2019-11-24 DIAGNOSIS — M25532 Pain in left wrist: Secondary | ICD-10-CM

## 2019-11-24 DIAGNOSIS — Z789 Other specified health status: Secondary | ICD-10-CM

## 2019-11-24 DIAGNOSIS — Z7409 Other reduced mobility: Secondary | ICD-10-CM

## 2019-11-24 DIAGNOSIS — S62102A Fracture of unspecified carpal bone, left wrist, initial encounter for closed fracture: Secondary | ICD-10-CM | POA: Diagnosis not present

## 2019-11-24 NOTE — Progress Notes (Addendum)
Subjective: CC: Fall, wrist pain PCP: Janora Norlander, DO [id number] [name] is a 83 y.o. female presenting to clinic today for:  1.  Fall Patient sustained a mechanical fall.  She fell on outstretched hand and has subsequently had pain and swelling of the left wrist.  She has been using ice applied to the affected area.  She has chronic pain medication in the form of a patch.  Does not report any sensation changes or skin breakdown.  She has pain with moving her forearm from left to right.   ROS: Per HPI  Allergies  Allergen Reactions  . Naproxen Other (See Comments)    Tongue swelling  . Penicillins Swelling    Swelling around site Has patient had a PCN reaction causing immediate rash, facial/tongue/throat swelling, SOB or lightheadedness with hypotension: Yes Has patient had a PCN reaction causing severe rash involving mucus membranes or skin necrosis: No Has patient had a PCN reaction that required hospitalization: No Has patient had a PCN reaction occurring within the last 10 years: No If all of the above answers are "NO", then may proceed with Cephalosporin use.   . Sulfonamide Derivatives Hives  . Aspirin Other (See Comments)    REACTION: regular strength causes "heart to beat fast"  . Captopril Hypertension  . Strawberry Extract Other (See Comments)  . Clindamycin/Lincomycin Other (See Comments)    unknown   Past Medical History:  Diagnosis Date  . Anxiety   . Arthritis   . Asthmatic bronchitis   . Atrial fibrillation ([place])   . Bowel obstruction ([facility])    blockage  . CAD (coronary artery disease)    Stent to RI 2003.  Myoview 2013 no ischemia.  . Cataract   . Chronic back pain   . Chronic bronchitis ([place])   . Colon polyp    adenomatous  . Congestive heart disease (H[facility])    Preserved EF  . Depression   . Esophageal motility disorder   . Fibromyalgia   . GERD (gastroesophageal reflux disease)   . History of hiatal hernia   . History of kidney stones    . Hyperlipidemia   . Hypertension   . Hypothyroid   . 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 Nephrolithiasis   . OA (osteoarthritis)   . Status post dilation of esophageal narrowing     Current Outpatient Medications:  .  albuterol (VENTOLIN HFA) 108 (90 Base) MCG/ACT inhaler, Inhale 2 puffs into the lungs every 4 (four) hours as needed for wheezing or shortness of breath., Disp: 6.7 g, Rfl: 0 .  ALPRAZolam (XANAX) 1 MG tablet, Take 0.5-1 tablets (0.5-1 mg total) by mouth 2 (two) times daily as needed for anxiety., Disp: 60 tablet, Rfl: 3 .  Ascorbic Acid (VITAMIN C) 1000 MG tablet, Take 1,000 mg by mouth daily., Disp: , Rfl:  .  beta carotene 25000 UNIT capsule, Take 25,000 Units by mouth daily., Disp: , Rfl:  .  budesonide (PULMICORT) 0.5 MG/2ML nebulizer solution, Take 2 mLs (0.5 mg total) by nebulization 2 (two) times daily., Disp: 120 mL, Rfl: 1 .  calcium carbonate (TUMS - DOSED IN MG ELEMENTAL CALCIUM) 500 MG chewable tablet, Chew 1 tablet by mouth 3 (three) times daily as needed for indigestion or heartburn., Disp: , Rfl:  .  Calcium Carbonate-Vitamin D (CALCIUM 600+D) 600-400 MG-UNIT per tablet, Take 1 tablet by mouth 2 (two) times daily. , Disp: , Rfl:  .  Coenzyme Q10  10 MG capsule, Take 10 mg by mouth daily., Disp: , Rfl:  .  denosumab (PROLIA) 60 MG/ML SOSY injection, Inject 60 mg every 6 (six) months into the skin. Administer in upper arm, thigh, or abdomen, Disp: 1 mL, Rfl: 0 .  dicyclomine (BENTYL) 10 MG capsule, Take 1 capsule (10 mg total) by mouth 4 (four) times daily as needed for spasms., Disp: 30 capsule, Rfl: 0 .  diltiazem (CARDIZEM) 120 MG tablet, Take 1 tablet (120 mg total) by mouth daily., Disp: 30 tablet, Rfl: 0 .  fluticasone (FLONASE) 50 MCG/ACT nasal spray, Place 2 sprays into both nostrils daily., Disp: , Rfl:  .  HYDROcodone-acetaminophen (NORCO/VICODIN) 5-325 MG tablet, Take 1 tablet by mouth every 4 (four) hours as needed for  moderate pain., Disp: 10 tablet, Rfl: 0 .  levothyroxine (SYNTHROID) 25 MCG tablet, Take 1 tablet (25 mcg total) by mouth daily before breakfast., Disp: 30 tablet, Rfl: 5 .  loratadine (CLARITIN) 10 MG tablet, Take 1 tablet (10 mg total) by mouth daily., Disp: 90 tablet, Rfl: 0 .  meclizine (ANTIVERT) 25 MG tablet, Take 1 tablet (25 mg total) by mouth 3 (three) times daily as needed for dizziness., Disp: 30 tablet, Rfl: 0 .  Melatonin 5 MG TABS, Take 10 mg by mouth every evening., Disp: , Rfl:  .  metoprolol tartrate (LOPRESSOR) 25 MG tablet, Take 2 tablets (50 mg total) by mouth daily. Take 2 tablets (50 mg) by mouth once daily at 8:00 am, Disp: 60 tablet, Rfl: 0 .  metoprolol tartrate (LOPRESSOR) 25 MG tablet, Take 1 tablet (25 mg total) by mouth every evening., Disp: 30 tablet, Rfl: 0 .  montelukast (SINGULAIR) 10 MG tablet, Take 1 tablet (10 mg total) by mouth daily., Disp: 90 tablet, Rfl: 0 .  Multiple Vitamin (MULTIVITAMIN) tablet, Take 1 tablet by mouth daily., Disp: , Rfl:  .  nitroGLYCERIN (NITROSTAT) 0.4 MG SL tablet, Place 1 tablet (0.4 mg total) under the tongue every 5 (five) minutes as needed for chest pain., Disp: 30 tablet, Rfl: 0 .  NON FORMULARY, Diet Type: NAS, Disp: , Rfl:  .  omega-3 acid ethyl esters (LOVAZA) 1 g capsule, Take 2 capsules (2 g total) by mouth 2 (two) times daily., Disp: 120 capsule, Rfl: 0 .  omeprazole-sodium bicarbonate (ZEGERID) 40-1100 MG capsule, Take 1 capsule by mouth daily., Disp: 30 capsule, Rfl: 0 .  ondansetron (ZOFRAN) 4 MG tablet, Take 1 tablet (4 mg total) by mouth every 6 (six) hours as needed for nausea or vomiting., Disp: 20 tablet, Rfl: 0 .  polyethylene glycol (MIRALAX / GLYCOLAX) 17 g packet, Take 17 g by mouth daily., Disp: , Rfl:  .  predniSONE (DELTASONE) 20 MG tablet, 2 po at sametime daily for 5 days, Disp: 10 tablet, Rfl: 0 .  rosuvastatin (CRESTOR) 20 MG tablet, Take 1 tablet (20 mg total) by mouth daily., Disp: 90 tablet, Rfl: 0 .   sennosides-docusate sodium (SENOKOT-S) 8.6-50 MG tablet, Take 1 tablet by mouth at bedtime., Disp: , Rfl:  .  sertraline (ZOLOFT) 100 MG tablet, Take 1 tablet (100 mg total) by mouth daily., Disp: 90 tablet, Rfl: 0 .  sodium chloride 1 g tablet, Take 2 tablets (2 g total) by mouth 3 (three) times daily., Disp: 180 tablet, Rfl: 0 .  tamsulosin (FLOMAX) 0.4 MG CAPS capsule, TAKE (1) CAPSULE DAILY, Disp: 90 capsule, Rfl: 0 .  tiZANidine (ZANAFLEX) 2 MG tablet, , Disp: , Rfl:  .  XARELTO 20 MG TABS  tablet, Take 1 tablet (20 mg total) by mouth daily., Disp: 90 tablet, Rfl: 0 .  buprenorphine (BUTRANS) 5 MCG/HR PTWK, Butrans 5 mcg/hour transdermal patch  Apply 1 patch every week by transdermal route., Disp: , Rfl:  .  cyclobenzaprine (FLEXERIL) 10 MG tablet, Take 10 mg by mouth 3 (three) times daily as needed., Disp: , Rfl:  Social History   Socioeconomic History  . Marital status: Widowed    Spouse name: Not on file  . Number of children: 2  . Years of education: Not on file  . Highest education level: Not on file  Occupational History  . Occupation: Retired  Scientific laboratory technician  . Financial resource strain: Not on file  . Food insecurity    Worry: Not on file    Inability: Not on file  . Transportation needs    Medical: Not on file    Non-medical: Not on file  Tobacco Use  . Smoking status: Former Smoker    Packs/day: 1.00    Years: 30.00    Pack years: 30.00    Types: Cigarettes    Quit date: 12/16/1989    Years since quitting: 29.9  . Smokeless tobacco: Never Used  . Tobacco comment: smoked off & on  Substance and Sexual Activity  . Alcohol use: No    Alcohol/week: 0.0 standard drinks  . Drug use: No  . Sexual activity: Not Currently  Lifestyle  . Physical activity    Days per week: Not on file    Minutes per session: Not on file  . Stress: Not on file  Relationships  . Social Herbalist on phone: Not on file    Gets together: Not on file    Attends religious service:  Not on file    Active member of club or organization: Not on file    Attends meetings of clubs or organizations: Not on file    Relationship status: Not on file  . Intimate partner violence    Fear of current or ex partner: Not on file    Emotionally abused: Not on file    Physically abused: Not on file    Forced sexual activity: Not on file  Other Topics Concern  . Not on file  Social History Narrative   Originally from [place]. Always lived in [place]. No international travel. Has prior travel to [employer], [place], [place], [place], [place], & [place]. No recent travel. She has a cat currently. No prior bird, mold, or hot tub exposure. 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 No known asbestos exposure. 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    Family History  Problem Relation Age of Onset  . Prostate cancer Brother   . Cancer Brother        PROSTATE  . Heart disease Mother   . Asthma Mother   . Congestive Heart Failure Mother   . Emphysema Sister   . COPD Sister   . Stroke Sister   . Heart disease Sister   . Emphysema Brother   . Rheumatologic disease Neg Hx     Objective: Office vital signs reviewed. BP (!) 142/68   Pulse 60   Temp 99.3 F (37.4 C) (Temporal)   SpO2 97%   Physical Examination:  General: Awake, alert, well nourished, No acute distress Extremities: warm, well perfused, No cyanosis or clubbing; +2 pulses bilaterally MSK:   Left wrist: Large hematoma noted  over the radial distal aspect of the dorsal wrist.  She has pain with supination and pronation.  Active range of motion is limited secondary to pain. Skin: Hematoma as above Neuro: Light touch sensation grossly intact  Dg Wrist Complete Left  Result Date: 11/24/2019 CLINICAL DATA:  83 year old who fell and injured the LEFT wrist. Pain and swelling. Initial encounter. EXAM: LEFT WRIST - COMPLETE 3+ VIEW COMPARISON:  None. FINDINGS: Acute mildly impacted  fracture involving the distal radius, best seen on the AP image. No other visible fractures. Severe osseous demineralization. Calcification involving the triangular fibrocartilage complex. Osteolysis of the trapezium bone. Mild-to-moderate narrowing of the radiocarpal joint space and the trapezoid-second metacarpal joint space. Remaining joint spaces well-preserved. Soft tissue swelling overlying the distal radius and involving the thenar eminence. IMPRESSION: 1. Acute mildly impacted fracture involving the distal radius. 2. Osteolysis of the trapezium bone. 3. Severe osseous demineralization. 4. Mild to moderate osteoarthritis involving the radiocarpal joint and the trapezoid-second metacarpal joint. 5. CPPD involving the triangular fibrocartilage complex. Electronically Signed   By: Evangeline Dakin M.D.   On: 11/24/2019 15:08    Assessment/ Plan: 83 y.o. female   1. Broken wrist, left, closed, initial encounter Personal view of the x-ray appears to show a compression fracture of the distal radius.  Confirmed by radiology.  Urgent referral to orthopedic surgery has been placed.  Patient placed in a brace.  She has home pain medication.  Continue icing, elevation. - Ambulatory referral to Orthopedic Surgery - Ambulatory referral to Home Health  2. Fall, initial encounter - DG Wrist Complete Left; Future - Ambulatory referral to Home Health  3. Impaired mobility and ADLs We will see if home health can come out and help her with ADLs given fracture risk and frequent falls.  Would benefit from strengthening exercises and gait training as well - Ambulatory referral to [facility] This Encounter  Procedures  . DG Wrist Complete Left    Standing Status:   Future    Number of Occurrences:   1    Standing Expiration Date:   01/23/2021    Order Specific Question:   Reason for Exam (SYMPTOM  OR DIAGNOSIS REQUIRED)    Answer:   fall    Order Specific Question:   Preferred imaging  location?    Answer:   [facility]  . Ambulatory referral to Orthopedic Surgery    Referral Priority:   Urgent    Referral Type:   Surgical    Referral Reason:   Specialty Services Required    Requested Specialty:   Orthopedic Surgery    Number of Visits Requested:   1  . Ambulatory referral to Home Health    Referral Priority:   Routine    Referral Type:   Home Health Care    Referral Reason:   Specialty Services Required    Requested Specialty:   [facility]    Number of Visits Requested:   1   No orders of the defined types were placed in this encounter.    Janora Norlander, DO [place] [phone]

## 2019-11-25 ENCOUNTER — Telehealth: Payer: Self-pay | Admitting: Family Medicine

## 2019-11-25 NOTE — Telephone Encounter (Signed)
Patient states the doctor that gave her the pain patch gave her flexeril patient calling to make sure with Dr. Darnell Level it was safe for her to take?

## 2019-11-25 NOTE — Telephone Encounter (Signed)
Patient aware and verbalized understanding. °

## 2019-11-25 NOTE — Telephone Encounter (Signed)
Ok to take but try 1/2 tablet to start and if no improvement in muscle spasms can go up to 1 whole tablet.  A whole tablet may be too sedating.  Warn about increase fall risk with medication.  Consider only taking at bedtime, particularly given frequent falls.

## 2019-11-25 NOTE — Telephone Encounter (Signed)
Patient would like to speak with a nurse regarding her Flexeril medication.

## 2019-11-26 ENCOUNTER — Telehealth: Payer: Self-pay | Admitting: Family Medicine

## 2019-11-26 NOTE — Telephone Encounter (Signed)
Left on homehealth VM patient has broken her arm and would like homehealth to come in and help with ADL's

## 2019-11-27 ENCOUNTER — Other Ambulatory Visit: Payer: Self-pay | Admitting: Physician Assistant

## 2019-11-29 ENCOUNTER — Ambulatory Visit: Payer: Medicare Other | Admitting: Physical Therapy

## 2019-11-29 ENCOUNTER — Telehealth: Payer: Self-pay | Admitting: Family Medicine

## 2019-11-29 NOTE — Telephone Encounter (Signed)
done

## 2019-11-29 NOTE — Addendum Note (Signed)
Addended by: Janora Norlander on: 11/29/2019 08:30 AM   Modules accepted: Orders

## 2019-11-29 NOTE — Telephone Encounter (Signed)
Pt aware [facility] will be giving her a call

## 2019-12-02 ENCOUNTER — Other Ambulatory Visit: Payer: Self-pay

## 2019-12-03 ENCOUNTER — Ambulatory Visit (INDEPENDENT_AMBULATORY_CARE_PROVIDER_SITE_OTHER): Payer: Medicare Other | Admitting: Family Medicine

## 2019-12-03 ENCOUNTER — Other Ambulatory Visit: Payer: Self-pay | Admitting: Family Medicine

## 2019-12-03 DIAGNOSIS — F411 Generalized anxiety disorder: Secondary | ICD-10-CM | POA: Diagnosis not present

## 2019-12-03 DIAGNOSIS — S62102D Fracture of unspecified carpal bone, left wrist, subsequent encounter for fracture with routine healing: Secondary | ICD-10-CM

## 2019-12-03 DIAGNOSIS — S62102A Fracture of unspecified carpal bone, left wrist, initial encounter for closed fracture: Secondary | ICD-10-CM

## 2019-12-03 MED ORDER — SERTRALINE HCL 100 MG PO TABS: 100.0000 mg | ORAL_TABLET | Freq: Every day | ORAL | 3 refills | Status: DC

## 2019-12-03 MED ORDER — ALPRAZOLAM 1 MG PO TABS: 0.5000 mg | ORAL_TABLET | Freq: Two times a day (BID) | ORAL | 3 refills | Status: DC | PRN

## 2019-12-03 NOTE — Progress Notes (Signed)
Telephone visit  Subjective: CC: f/u mood PCP: Janora Norlander, DO [id number] [name] is a 83 y.o. female calls for telephone consult today. Patient provides verbal consent for consult held via phone.  Due to COVID-19 pandemic this visit was conducted virtually. This visit type was conducted due to national recommendations for restrictions regarding the COVID-19 Pandemic (e.g. social distancing, sheltering in place) in an effort to limit this patient's exposure and mitigate transmission in our community. All issues noted in this document were discussed and addressed.  A physical exam was not performed with this format.   Location of patient: home Location of provider: [facility] Others present for call: none  1. GAD Patient reports stability of generalized anxiety disorder.  She primarily uses Xanax at bedtime for sleep.  She rarely will use it during the daytime.  She does have several pills left but will need a renewal as her prescription will be expiring soon.  She has had a few falls since her last visit but again these have not been related to Xanax use.  They have been during various times of the day.  She has underlying weakness and balance issues.  She is currently working with home physical therapy on this.  Denies any respiratory depression, change in mental status, memory changes or dizziness.  2.  Broken wrist Patient was seen about a week and a half ago for a fracture of her left wrist.  She is currently casted.  She is treated with a pain patch.  She has follow-up with orthopedics in the next several weeks.  She does report pain with pronation of the wrist.   ROS: Per HPI  Allergies  Allergen Reactions  . Naproxen Other (See Comments)    Tongue swelling  . Penicillins Swelling    Swelling around site Has patient had a PCN reaction causing immediate rash, facial/tongue/throat swelling, SOB or lightheadedness with hypotension: Yes Has patient had a PCN reaction causing severe  rash involving mucus membranes or skin necrosis: No Has patient had a PCN reaction that required hospitalization: No Has patient had a PCN reaction occurring within the last 10 years: No If all of the above answers are "NO", then may proceed with Cephalosporin use.   . Sulfonamide Derivatives Hives  . Aspirin Other (See Comments)    REACTION: regular strength causes "heart to beat fast"  . Captopril Hypertension  . Strawberry Extract Other (See Comments)  . Clindamycin/Lincomycin Other (See Comments)    unknown   Past Medical History:  Diagnosis Date  . Anxiety   . Arthritis   . Asthmatic bronchitis   . Atrial fibrillation ([place])   . Bowel obstruction ([facility])    blockage  . CAD (coronary artery disease)    Stent to RI 2003.  Myoview 2013 no ischemia.  . Cataract   . Chronic back pain   . Chronic bronchitis ([place])   . Colon polyp    adenomatous  . Congestive heart disease ([facility])    Preserved EF  . Depression   . Esophageal motility disorder   . Fibromyalgia   . GERD (gastroesophageal reflux disease)   . History of hiatal hernia   . History of kidney stones   . Hyperlipidemia   . Hypertension   . Hypothyroid   . 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 Nephrolithiasis   . OA (osteoarthritis)   . Status post dilation of esophageal narrowing     Current Outpatient Medications:  .  albuterol (VENTOLIN HFA) 108 (90 Base) MCG/ACT inhaler, Inhale 2 puffs into the lungs every 4 (four) hours as needed for wheezing or shortness of breath., Disp: 6.7 g, Rfl: 0 .  ALPRAZolam (XANAX) 1 MG tablet, Take 0.5-1 tablets (0.5-1 mg total) by mouth 2 (two) times daily as needed for anxiety., Disp: 60 tablet, Rfl: 3 .  Ascorbic Acid (VITAMIN C) 1000 MG tablet, Take 1,000 mg by mouth daily., Disp: , Rfl:  .  beta carotene 25000 UNIT capsule, Take 25,000 Units by mouth daily., Disp: , Rfl:  .  budesonide (PULMICORT) 0.5 MG/2ML nebulizer solution, Take 2 mLs (0.5 mg total)  by nebulization 2 (two) times daily., Disp: 120 mL, Rfl: 1 .  buprenorphine (BUTRANS) 5 MCG/HR PTWK, Butrans 5 mcg/hour transdermal patch  Apply 1 patch every week by transdermal route., Disp: , Rfl:  .  calcium carbonate (TUMS - DOSED IN MG ELEMENTAL CALCIUM) 500 MG chewable tablet, Chew 1 tablet by mouth 3 (three) times daily as needed for indigestion or heartburn., Disp: , Rfl:  .  Calcium Carbonate-Vitamin D (CALCIUM 600+D) 600-400 MG-UNIT per tablet, Take 1 tablet by mouth 2 (two) times daily. , Disp: , Rfl:  .  Coenzyme Q10 10 MG capsule, Take 10 mg by mouth daily., Disp: , Rfl:  .  cyclobenzaprine (FLEXERIL) 10 MG tablet, Take 10 mg by mouth 3 (three) times daily as needed., Disp: , Rfl:  .  denosumab (PROLIA) 60 MG/ML SOSY injection, Inject 60 mg every 6 (six) months into the skin. Administer in upper arm, thigh, or abdomen, Disp: 1 mL, Rfl: 0 .  dicyclomine (BENTYL) 10 MG capsule, Take 1 capsule (10 mg total) by mouth 4 (four) times daily as needed for spasms., Disp: 30 capsule, Rfl: 0 .  diltiazem (CARDIZEM) 120 MG tablet, Take 1 tablet (120 mg total) by mouth daily., Disp: 30 tablet, Rfl: 0 .  fluticasone (FLONASE) 50 MCG/ACT nasal spray, Place 2 sprays into both nostrils daily., Disp: , Rfl:  .  HYDROcodone-acetaminophen (NORCO/VICODIN) 5-325 MG tablet, Take 1 tablet by mouth every 4 (four) hours as needed for moderate pain., Disp: 10 tablet, Rfl: 0 .  levothyroxine (SYNTHROID) 25 MCG tablet, Take 1 tablet (25 mcg total) by mouth daily before breakfast., Disp: 30 tablet, Rfl: 5 .  loratadine (CLARITIN) 10 MG tablet, Take 1 tablet (10 mg total) by mouth daily., Disp: 90 tablet, Rfl: 0 .  meclizine (ANTIVERT) 25 MG tablet, Take 1 tablet (25 mg total) by mouth 3 (three) times daily as needed for dizziness., Disp: 30 tablet, Rfl: 0 .  Melatonin 5 MG TABS, Take 10 mg by mouth every evening., Disp: , Rfl:  .  metoprolol tartrate (LOPRESSOR) 25 MG tablet, Take 2 tablets (50 mg total) by mouth  daily. Take 2 tablets (50 mg) by mouth once daily at 8:00 am, Disp: 60 tablet, Rfl: 0 .  metoprolol tartrate (LOPRESSOR) 25 MG tablet, Take 1 tablet (25 mg total) by mouth every evening., Disp: 30 tablet, Rfl: 0 .  montelukast (SINGULAIR) 10 MG tablet, Take 1 tablet (10 mg total) by mouth daily., Disp: 90 tablet, Rfl: 0 .  Multiple Vitamin (MULTIVITAMIN) tablet, Take 1 tablet by mouth daily., Disp: , Rfl:  .  nitroGLYCERIN (NITROSTAT) 0.4 MG SL tablet, Place 1 tablet (0.4 mg total) under the tongue every 5 (five) minutes as needed for chest pain., Disp: 30 tablet, Rfl: 0 .  NON FORMULARY, Diet Type: NAS, Disp: , Rfl:  .  omega-3 acid ethyl esters (  LOVAZA) 1 g capsule, Take 2 capsules (2 g total) by mouth 2 (two) times daily., Disp: 120 capsule, Rfl: 0 .  omeprazole-sodium bicarbonate (ZEGERID) 40-1100 MG capsule, Take 1 capsule by mouth daily., Disp: 30 capsule, Rfl: 0 .  ondansetron (ZOFRAN) 4 MG tablet, Take 1 tablet (4 mg total) by mouth every 6 (six) hours as needed for nausea or vomiting., Disp: 20 tablet, Rfl: 0 .  polyethylene glycol (MIRALAX / GLYCOLAX) 17 g packet, Take 17 g by mouth daily., Disp: , Rfl:  .  predniSONE (DELTASONE) 20 MG tablet, 2 po at sametime daily for 5 days, Disp: 10 tablet, Rfl: 0 .  rosuvastatin (CRESTOR) 20 MG tablet, Take 1 tablet (20 mg total) by mouth daily., Disp: 90 tablet, Rfl: 0 .  sennosides-docusate sodium (SENOKOT-S) 8.6-50 MG tablet, Take 1 tablet by mouth at bedtime., Disp: , Rfl:  .  sertraline (ZOLOFT) 100 MG tablet, Take 1 tablet (100 mg total) by mouth daily., Disp: 90 tablet, Rfl: 0 .  sodium chloride 1 g tablet, Take 2 tablets (2 g total) by mouth 3 (three) times daily., Disp: 180 tablet, Rfl: 0 .  tamsulosin (FLOMAX) 0.4 MG CAPS capsule, TAKE (1) CAPSULE DAILY, Disp: 90 capsule, Rfl: 0 .  tiZANidine (ZANAFLEX) 2 MG tablet, , Disp: , Rfl:  .  XARELTO 20 MG TABS tablet, Take 1 tablet (20 mg total) by mouth daily., Disp: 90 tablet, Rfl:  0  Assessment/ Plan: 83 y.o. female   1. Generalized anxiety disorder We discussed sparing use of the alprazolam.  I do worry given her need for chronic opioid and frequent falls continuing this benzodiazepine.  We discussed that if she is unable to rehab with physical therapy and continues to same falls, may need to decrease versus discontinue the benzodiazepine.  For now, we will continue her current dose at bedtime.  She will follow-up in 3 months, sooner if needed.  This appointment has been scheduled.  The Narcotic Database has been reviewed.  There were no red flags.   - ALPRAZolam (XANAX) 1 MG tablet; Take 0.5-1 tablets (0.5-1 mg total) by mouth 2 (two) times daily as needed for anxiety.  Dispense: 60 tablet; Refill: 3  2. Broken wrist, left, closed, initial encounter Currently being monitored by orthopedics  Start time: 11:22am End time: 11:31am  Total time spent on patient care (including telephone call/ virtual visit): 15 minutes  [place], [place] [phone]

## 2019-12-08 ENCOUNTER — Telehealth: Payer: Self-pay | Admitting: *Deleted

## 2019-12-08 NOTE — Telephone Encounter (Signed)
She says she is fine, it is her back that hurts and it hurt before the fall. It is just where she laid on the hard floor.

## 2019-12-08 NOTE — Telephone Encounter (Signed)
Ok, just forward to dr. Darnell Level

## 2019-12-08 NOTE — Telephone Encounter (Signed)
Can someone reach out and see if she needs advice to go to emergency?

## 2019-12-08 NOTE — Telephone Encounter (Signed)
FYI VM from [facility] w/ [facility] Pt felll / slid off bed 2 days ago laid in floor from 2a - 8a called EMS to get her up declined ED, pain 10/10

## 2019-12-20 ENCOUNTER — Ambulatory Visit (INDEPENDENT_AMBULATORY_CARE_PROVIDER_SITE_OTHER): Payer: Medicare Other

## 2019-12-20 ENCOUNTER — Other Ambulatory Visit: Payer: Self-pay

## 2019-12-20 DIAGNOSIS — I4891 Unspecified atrial fibrillation: Secondary | ICD-10-CM | POA: Diagnosis not present

## 2019-12-20 DIAGNOSIS — M549 Dorsalgia, unspecified: Secondary | ICD-10-CM

## 2019-12-20 DIAGNOSIS — G8929 Other chronic pain: Secondary | ICD-10-CM

## 2019-12-20 DIAGNOSIS — I110 Hypertensive heart disease with heart failure: Secondary | ICD-10-CM

## 2019-12-20 DIAGNOSIS — I2510 Atherosclerotic heart disease of native coronary artery without angina pectoris: Secondary | ICD-10-CM | POA: Diagnosis not present

## 2019-12-20 DIAGNOSIS — M797 Fibromyalgia: Secondary | ICD-10-CM

## 2019-12-20 DIAGNOSIS — I509 Heart failure, unspecified: Secondary | ICD-10-CM

## 2019-12-20 DIAGNOSIS — W19XXXD Unspecified fall, subsequent encounter: Secondary | ICD-10-CM

## 2019-12-20 DIAGNOSIS — J449 Chronic obstructive pulmonary disease, unspecified: Secondary | ICD-10-CM

## 2019-12-20 DIAGNOSIS — M1990 Unspecified osteoarthritis, unspecified site: Secondary | ICD-10-CM

## 2019-12-20 DIAGNOSIS — S52502D Unspecified fracture of the lower end of left radius, subsequent encounter for closed fracture with routine healing: Secondary | ICD-10-CM

## 2019-12-20 DIAGNOSIS — F419 Anxiety disorder, unspecified: Secondary | ICD-10-CM

## 2019-12-20 DIAGNOSIS — E039 Hypothyroidism, unspecified: Secondary | ICD-10-CM

## 2019-12-20 DIAGNOSIS — H269 Unspecified cataract: Secondary | ICD-10-CM

## 2019-12-20 DIAGNOSIS — E785 Hyperlipidemia, unspecified: Secondary | ICD-10-CM

## 2019-12-20 DIAGNOSIS — K224 Dyskinesia of esophagus: Secondary | ICD-10-CM

## 2019-12-20 DIAGNOSIS — K219 Gastro-esophageal reflux disease without esophagitis: Secondary | ICD-10-CM

## 2019-12-20 DIAGNOSIS — F329 Major depressive disorder, single episode, unspecified: Secondary | ICD-10-CM

## 2019-12-30 ENCOUNTER — Other Ambulatory Visit: Payer: Self-pay | Admitting: Nurse Practitioner

## 2019-12-31 ENCOUNTER — Other Ambulatory Visit: Payer: Self-pay | Admitting: *Deleted

## 2019-12-31 MED ORDER — ONDANSETRON HCL 4 MG PO TABS: 4.0000 mg | ORAL_TABLET | Freq: Four times a day (QID) | ORAL | 0 refills | Status: DC | PRN

## 2019-12-31 MED ORDER — DICYCLOMINE HCL 10 MG PO CAPS: 10.0000 mg | ORAL_CAPSULE | Freq: Four times a day (QID) | ORAL | 0 refills | Status: DC | PRN

## 2020-01-17 ENCOUNTER — Encounter: Payer: Self-pay | Admitting: Family Medicine

## 2020-01-17 ENCOUNTER — Ambulatory Visit (INDEPENDENT_AMBULATORY_CARE_PROVIDER_SITE_OTHER): Payer: Medicare Other | Admitting: Family Medicine

## 2020-01-17 ENCOUNTER — Other Ambulatory Visit: Payer: Self-pay

## 2020-01-17 DIAGNOSIS — B029 Zoster without complications: Secondary | ICD-10-CM | POA: Diagnosis not present

## 2020-01-17 MED ORDER — PREDNISONE 10 MG PO TABS: ORAL_TABLET | ORAL | 0 refills | Status: DC

## 2020-01-17 MED ORDER — VALACYCLOVIR HCL 1 G PO TABS: 1000.0000 mg | ORAL_TABLET | Freq: Three times a day (TID) | ORAL | 0 refills | Status: DC

## 2020-01-17 NOTE — Progress Notes (Signed)
Subjective:    Patient ID: [name], female    DOB: August 28, 1936, 84 y.o.   MRN: [id number]   HPI: [name] SCHOLLE is a 84 y.o. female presenting for rash under breast on the right side around to back at scapula. Not resting. Onset 2 days ago. Denies fever, chills. Nauseated.  Rash itches and burns.    Depression screen [facility] 2/9 11/24/2019 08/05/2019 12/14/2018 08/10/2018 05/20/2018  Decreased Interest 1 0 0 0 0  Down, Depressed, Hopeless 1 0 0 0 0  PHQ - 2 Score 2 0 0 0 0  Altered sleeping 1 0 - - -  Tired, decreased energy 1 0 - - -  Change in appetite 1 0 - - -  Feeling bad or failure about yourself  1 1 - - -  Trouble concentrating 1 0 - - -  Moving slowly or fidgety/restless 1 0 - - -  Suicidal thoughts 1 0 - - -  PHQ-9 Score 9 1 - - -  Difficult doing work/chores Somewhat difficult - - - -  Some recent data might be hidden     Relevant past medical, surgical, family and social history reviewed and updated as indicated.  Interim medical history since our last visit reviewed. Allergies and medications reviewed and updated.  ROS:  Review of Systems  Constitutional: Negative.   HENT: Negative.   Respiratory: Negative for shortness of breath.   Cardiovascular: Negative for chest pain.  Gastrointestinal: Negative for abdominal pain.  Musculoskeletal: Negative for arthralgias.     Social History   Tobacco Use  Smoking Status Former Smoker  . Packs/day: 1.00  . Years: 30.00  . Pack years: 30.00  . Types: Cigarettes  . Quit date: 12/16/1989  . Years since quitting: 30.1  Smokeless Tobacco Never Used  Tobacco Comment   smoked off & on       Objective:     Wt Readings from Last 3 Encounters:  09/09/19 143 lb (64.9 kg)  07/23/19 142 lb 9.6 oz (64.7 kg)  07/23/19 142 lb 9.6 oz (64.7 kg)     Exam deferred. Pt. Harboring due to COVID 19. Phone visit performed.   Assessment & Plan:   1. Herpes zoster without complication     Meds ordered this encounter    Medications  . predniSONE (DELTASONE) 10 MG tablet    Sig: Take 5 daily for 2 days followed by 4,3,2 and 1 for 2 days each.    Dispense:  30 tablet    Refill:  0  . valACYclovir (VALTREX) 1000 MG tablet    Sig: Take 1 tablet (1,000 mg total) by mouth 3 (three) times daily.    Dispense:  21 tablet    Refill:  0    No orders of the defined types were placed in this encounter.     Diagnoses and all orders for this visit:  Herpes zoster without complication  Other orders -     predniSONE (DELTASONE) 10 MG tablet; Take 5 daily for 2 days followed by 4,3,2 and 1 for 2 days each. -     valACYclovir (VALTREX) 1000 MG tablet; Take 1 tablet (1,000 mg total) by mouth 3 (three) times daily.    Virtual Visit via telephone Note  I discussed the limitations, risks, security and privacy concerns of performing an evaluation and management service by telephone and the availability of in person appointments. The patient was identified with two identifiers. Pt.expressed understanding and agreed to proceed.  Pt. Is at home. Dr. Livia Snellen is in his office.  Follow Up Instructions:   I discussed the assessment and treatment plan with the patient. The patient was provided an opportunity to ask questions and all were answered. The patient agreed with the plan and demonstrated an understanding of the instructions.   The patient was advised to call back or seek an in-person evaluation if the symptoms worsen or if the condition fails to improve as anticipated.   Total minutes including chart review and phone contact time: 17   Follow up plan: Return in about 2 weeks (around 01/31/2020), or if symptoms worsen or fail to improve.  Claretta Fraise, MD [place]

## 2020-01-20 ENCOUNTER — Other Ambulatory Visit: Payer: Self-pay | Admitting: *Deleted

## 2020-01-20 MED ORDER — OMEPRAZOLE-SODIUM BICARBONATE 40-1100 MG PO CAPS: 1.0000 | ORAL_CAPSULE | Freq: Every day | ORAL | 1 refills | Status: DC

## 2020-01-20 NOTE — Telephone Encounter (Signed)
Next OV 03/08/20

## 2020-01-27 ENCOUNTER — Ambulatory Visit (INDEPENDENT_AMBULATORY_CARE_PROVIDER_SITE_OTHER): Payer: Medicare Other | Admitting: Family Medicine

## 2020-01-27 ENCOUNTER — Telehealth: Payer: Self-pay | Admitting: Family Medicine

## 2020-01-27 ENCOUNTER — Encounter: Payer: Self-pay | Admitting: Family Medicine

## 2020-01-27 DIAGNOSIS — K5909 Other constipation: Secondary | ICD-10-CM | POA: Diagnosis not present

## 2020-01-27 DIAGNOSIS — B0229 Other postherpetic nervous system involvement: Secondary | ICD-10-CM

## 2020-01-27 MED ORDER — ACETAMINOPHEN-CODEINE #3 300-30 MG PO TABS: 1.0000 | ORAL_TABLET | Freq: Four times a day (QID) | ORAL | 0 refills | Status: AC | PRN

## 2020-01-27 MED ORDER — LINACLOTIDE 145 MCG PO CAPS: 145.0000 ug | ORAL_CAPSULE | Freq: Every day | ORAL | 5 refills | Status: DC

## 2020-01-27 NOTE — Telephone Encounter (Signed)
Real weak BP low- 94/60 but now at 130's  Getting over bowel impaction. First one yesterday afternoon. Had been constipated for 2-3 days. No bleeding, no rectal pain.  Wants to get her energy level back.  Would like to be seen sooner than 3/24 appt with Dr. Darnell Level.

## 2020-01-27 NOTE — Progress Notes (Signed)
Subjective:    Patient ID: [name], female    DOB: Feb 14, 1936, 84 y.o.   MRN: [id number]   HPI: [name] is a 84 y.o. female presenting for pain at area of shingles under the right breast. This morning was so weak at her lymphoma check up that the doctor suggested she contact her PCP. BP was 129/90. Bowels were impacted 2 days ago. Stopped the hydrocodone 2 weeks ago.    Depression screen [facility] 2/9 11/24/2019 08/05/2019 12/14/2018 08/10/2018 05/20/2018  Decreased Interest 1 0 0 0 0  Down, Depressed, Hopeless 1 0 0 0 0  PHQ - 2 Score 2 0 0 0 0  Altered sleeping 1 0 - - -  Tired, decreased energy 1 0 - - -  Change in appetite 1 0 - - -  Feeling bad or failure about yourself  1 1 - - -  Trouble concentrating 1 0 - - -  Moving slowly or fidgety/restless 1 0 - - -  Suicidal thoughts 1 0 - - -  PHQ-9 Score 9 1 - - -  Difficult doing work/chores Somewhat difficult - - - -  Some recent data might be hidden     Relevant past medical, surgical, family and social history reviewed and updated as indicated.  Interim medical history since our last visit reviewed. Allergies and medications reviewed and updated.  ROS:  Review of Systems  Constitutional: Negative.   HENT: Negative.   Eyes: Negative for visual disturbance.  Respiratory: Negative for shortness of breath.   Cardiovascular: Negative for chest pain.  Gastrointestinal: Positive for constipation. Negative for abdominal pain and blood in stool.  Musculoskeletal: Negative for arthralgias.     Social History   Tobacco Use  Smoking Status Former Smoker  . Packs/day: 1.00  . Years: 30.00  . Pack years: 30.00  . Types: Cigarettes  . Quit date: 12/16/1989  . Years since quitting: 30.1  Smokeless Tobacco Never Used  Tobacco Comment   smoked off & on       Objective:     Wt Readings from Last 3 Encounters:  09/09/19 143 lb (64.9 kg)  07/23/19 142 lb 9.6 oz (64.7 kg)  07/23/19 142 lb 9.6 oz (64.7 kg)     Exam  deferred. Pt. Harboring due to COVID 19. Phone visit performed.   Assessment & Plan:   1. Post herpetic neuralgia   2. Chronic constipation     Meds ordered this encounter  Medications  . linaclotide (LINZESS) 145 MCG CAPS capsule    Sig: Take 1 capsule (145 mcg total) by mouth daily. To regulate bowel movements    Dispense:  30 capsule    Refill:  5  . acetaminophen-codeine (TYLENOL #3) 300-30 MG tablet    Sig: Take 1-2 tablets by mouth every 6 (six) hours as needed for up to 5 days for moderate pain.    Dispense:  40 tablet    Refill:  0      Diagnoses and all orders for this visit:  Post herpetic neuralgia -     acetaminophen-codeine (TYLENOL #3) 300-30 MG tablet; Take 1-2 tablets by mouth every 6 (six) hours as needed for up to 5 days for moderate pain.  Chronic constipation -     linaclotide (LINZESS) 145 MCG CAPS capsule; Take 1 capsule (145 mcg total) by mouth daily. To regulate bowel movements    Virtual Visit via telephone Note  I discussed the limitations, risks, security and  privacy concerns of performing an evaluation and management service by telephone and the availability of in person appointments. The patient was identified with two identifiers. Pt.expressed understanding and agreed to proceed. Pt. Is at home. Dr. Livia Snellen is in his office.  Follow Up Instructions:   I discussed the assessment and treatment plan with the patient. The patient was provided an opportunity to ask questions and all were answered. The patient agreed with the plan and demonstrated an understanding of the instructions.   The patient was advised to call back or seek an in-person evaluation if the symptoms worsen or if the condition fails to improve as anticipated.   Total minutes including chart review and phone contact time: 26   Follow up plan: Return if symptoms worsen or fail to improve.  Claretta Fraise, MD [place]

## 2020-01-28 ENCOUNTER — Other Ambulatory Visit: Payer: Self-pay | Admitting: Family Medicine

## 2020-02-03 ENCOUNTER — Ambulatory Visit: Payer: Medicare Other | Admitting: Physician Assistant

## 2020-02-10 ENCOUNTER — Other Ambulatory Visit: Payer: Self-pay | Admitting: Nurse Practitioner

## 2020-02-10 ENCOUNTER — Telehealth: Payer: Self-pay | Admitting: Family Medicine

## 2020-02-10 NOTE — Telephone Encounter (Signed)
Patient complains of problems with her bowels.  She said she has had constipation and feels like she has had impaction of her bowels at least once weekly for the last 3-4 weeks.  She has been trying to drink more water and yesterday tried an enema which did not work.  Dr. Livia Snellen had prescribed Linzess but the patient feels that it is not working.  She would like to know what else you recommend.  Patient uses [facility].

## 2020-02-10 NOTE — Telephone Encounter (Signed)
She can try taking Miralax instead of the Linzess.  Below is instructions for cleanout.  Please make sure she understands the instructions.  Miralax:   -First dose 68g (4 capfuls) in 32oz water over 1 to 2 hours for clean out.  -Next day start 17g or 1 capful daily, may adjust dose up or down by half a capful every few days. Recommend to take this medicine daily for next 1-2 weeks, then may need to use it longer if needed. - Goal is to have soft regular bowel movement 1-3x daily, if too runny or diarrhea, then reduce dose of the medicine  Improve water intake, hydration will help Also recommend increased vegetables, fruits, fiber intake Can try daily Metamucil or Fiber supplement at pharmacy over the counter  Follow-up if symptoms are not improving with bowel movements, or if pain worsens, develop fevers, nausea, vomiting.  Please schedule a follow-up appointment with Dr Lajuana Ripple in 1 month to follow-up Constipation  If you have any other questions or concerns, please feel free to call the clinic to contact me. You may also schedule an earlier appointment if necessary.  However, if your symptoms get significantly worse, please go to the Emergency Department to seek immediate medical attention.

## 2020-02-10 NOTE — Telephone Encounter (Signed)
Went over instructions with patient and had her write them down.  Advised her to call us if she had any problem.

## 2020-02-11 ENCOUNTER — Ambulatory Visit (INDEPENDENT_AMBULATORY_CARE_PROVIDER_SITE_OTHER): Payer: Medicare Other | Admitting: Family Medicine

## 2020-02-11 DIAGNOSIS — R05 Cough: Secondary | ICD-10-CM

## 2020-02-11 DIAGNOSIS — R059 Cough, unspecified: Secondary | ICD-10-CM

## 2020-02-11 DIAGNOSIS — K5909 Other constipation: Secondary | ICD-10-CM

## 2020-02-11 MED ORDER — BENZONATATE 100 MG PO CAPS: 100.0000 mg | ORAL_CAPSULE | Freq: Three times a day (TID) | ORAL | 0 refills | Status: DC | PRN

## 2020-02-11 NOTE — Progress Notes (Signed)
Telephone visit  Subjective: CC: cough PCP: Janora Norlander, DO [id number] [name] is a 84 y.o. female calls for telephone consult today. Patient provides verbal consent for consult held via phone.  Due to COVID-19 pandemic this visit was conducted virtually. This visit type was conducted due to national recommendations for restrictions regarding the COVID-19 Pandemic (e.g. social distancing, sheltering in place) in an effort to limit this patient's exposure and mitigate transmission in our community. All issues noted in this document were discussed and addressed.  A physical exam was not performed with this format.   Location of patient: home Location of provider: [facility] Others present for call: none  1. Cough Patient reports that she has been having an "allergy cough" that begins late in the afternoon.  She has been taking tessalon perles and they help. No fevers, shortness of breath.  2. Constipation She drank the miralax and was able to have a bowel movement yesterday. She notes that stools were loose yesterday.  She is hydrating well.  No nausea, vomiting.    ROS: Per HPI  Allergies  Allergen Reactions  . Naproxen Other (See Comments)    Tongue swelling  . Penicillins Swelling    Swelling around site Has patient had a PCN reaction causing immediate rash, facial/tongue/throat swelling, SOB or lightheadedness with hypotension: Yes Has patient had a PCN reaction causing severe rash involving mucus membranes or skin necrosis: No Has patient had a PCN reaction that required hospitalization: No Has patient had a PCN reaction occurring within the last 10 years: No If all of the above answers are "NO", then may proceed with Cephalosporin use.   . Sulfonamide Derivatives Hives  . Aspirin Other (See Comments)    REACTION: regular strength causes "heart to beat fast"  . Captopril Hypertension  . Strawberry Extract Other (See Comments)  . Clindamycin/Lincomycin Other (See Comments)     unknown   Past Medical History:  Diagnosis Date  . Anxiety   . Arthritis   . Asthmatic bronchitis   . Atrial fibrillation ([place])   . Bowel obstruction ([facility])    blockage  . CAD (coronary artery disease)    Stent to [place] 2003.  Myoview 2013 no ischemia.  . Cataract   . Chronic back pain   . Chronic bronchitis ([place])   . Colon polyp    adenomatous  . Congestive heart disease (HCC)    Preserved EF  . Depression   . Esophageal motility disorder   . Fibromyalgia   . GERD (gastroesophageal reflux disease)   . History of hiatal hernia   . History of kidney stones   . Hyperlipidemia   . Hypertension   . Hypothyroid   . 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 Nephrolithiasis   . OA (osteoarthritis)   . Status post dilation of esophageal narrowing     Current Outpatient Medications:  .  albuterol (VENTOLIN HFA) 108 (90 Base) MCG/ACT inhaler, Inhale 2 puffs into the lungs every 4 (four) hours as needed for wheezing or shortness of breath., Disp: 6.7 g, Rfl: 0 .  ALPRAZolam (XANAX) 1 MG tablet, Take 0.5-1 tablets (0.5-1 mg total) by mouth 2 (two) times daily as needed for anxiety., Disp: 60 tablet, Rfl: 3 .  Ascorbic Acid (VITAMIN C) 1000 MG tablet, Take 1,000 mg by mouth daily., Disp: , Rfl:  .  beta carotene 25000 UNIT capsule, Take 25,000 Units by mouth daily., Disp: , Rfl:  .  budesonide (  PULMICORT) 0.5 MG/2ML nebulizer solution, Take 2 mLs (0.5 mg total) by nebulization 2 (two) times daily., Disp: 120 mL, Rfl: 1 .  buprenorphine (BUTRANS) 5 MCG/HR PTWK, [place] 5 mcg/hour transdermal patch  Apply 1 patch every week by transdermal route., Disp: , Rfl:  .  calcium carbonate (TUMS - DOSED IN MG ELEMENTAL CALCIUM) 500 MG chewable tablet, Chew 1 tablet by mouth 3 (three) times daily as needed for indigestion or heartburn., Disp: , Rfl:  .  Calcium Carbonate-Vitamin D (CALCIUM 600+D) 600-400 MG-UNIT per tablet, Take 1 tablet by mouth 2 (two) times daily. , Disp: ,  Rfl:  .  Coenzyme Q10 10 MG capsule, Take 10 mg by mouth daily., Disp: , Rfl:  .  cyclobenzaprine (FLEXERIL) 10 MG tablet, Take 10 mg by mouth 3 (three) times daily as needed., Disp: , Rfl:  .  denosumab (PROLIA) 60 MG/ML SOSY injection, Inject 60 mg every 6 (six) months into the skin. Administer in upper arm, thigh, or abdomen, Disp: 1 mL, Rfl: 0 .  dicyclomine (BENTYL) 10 MG capsule, Take 1 capsule (10 mg total) by mouth 4 (four) times daily as needed for spasms., Disp: 30 capsule, Rfl: 0 .  diltiazem (CARDIZEM) 120 MG tablet, Take 1 tablet (120 mg total) by mouth daily., Disp: 30 tablet, Rfl: 0 .  fluticasone (FLONASE) 50 MCG/ACT nasal spray, Place 2 sprays into both nostrils daily., Disp: , Rfl:  .  HYDROcodone-acetaminophen (NORCO/VICODIN) 5-325 MG tablet, Take 1 tablet by mouth every 4 (four) hours as needed for moderate pain., Disp: 10 tablet, Rfl: 0 .  levothyroxine (SYNTHROID) 25 MCG tablet, Take 1 tablet (25 mcg total) by mouth daily before breakfast., Disp: 30 tablet, Rfl: 5 .  linaclotide (LINZESS) 145 MCG CAPS capsule, Take 1 capsule (145 mcg total) by mouth daily. To regulate bowel movements, Disp: 30 capsule, Rfl: 5 .  loratadine (CLARITIN) 10 MG tablet, Take 1 tablet (10 mg total) by mouth daily., Disp: 90 tablet, Rfl: 0 .  meclizine (ANTIVERT) 25 MG tablet, Take 1 tablet (25 mg total) by mouth 3 (three) times daily as needed for dizziness., Disp: 30 tablet, Rfl: 0 .  Melatonin 5 MG TABS, Take 10 mg by mouth every evening., Disp: , Rfl:  .  metoprolol tartrate (LOPRESSOR) 25 MG tablet, Take 2 tablets (50 mg total) by mouth daily. Take 2 tablets (50 mg) by mouth once daily at 8:00 am, Disp: 60 tablet, Rfl: 0 .  metoprolol tartrate (LOPRESSOR) 25 MG tablet, Take 1 tablet (25 mg total) by mouth every evening., Disp: 30 tablet, Rfl: 0 .  montelukast (SINGULAIR) 10 MG tablet, Take 1 tablet (10 mg total) by mouth daily., Disp: 90 tablet, Rfl: 0 .  Multiple Vitamin (MULTIVITAMIN) tablet,  Take 1 tablet by mouth daily., Disp: , Rfl:  .  nitroGLYCERIN (NITROSTAT) 0.4 MG SL tablet, Place 1 tablet (0.4 mg total) under the tongue every 5 (five) minutes as needed for chest pain., Disp: 30 tablet, Rfl: 0 .  NON FORMULARY, Diet Type: NAS, Disp: , Rfl:  .  omega-3 acid ethyl esters (LOVAZA) 1 g capsule, Take 2 capsules (2 g total) by mouth 2 (two) times daily., Disp: 120 capsule, Rfl: 0 .  omeprazole-sodium bicarbonate (ZEGERID) 40-1100 MG capsule, Take 1 capsule by mouth daily., Disp: 30 capsule, Rfl: 1 .  ondansetron (ZOFRAN) 4 MG tablet, Take 1 tablet (4 mg total) by mouth every 6 (six) hours as needed for nausea or vomiting., Disp: 20 tablet, Rfl: 0 .  polyethylene glycol (MIRALAX / GLYCOLAX) 17 g packet, Take 17 g by mouth daily., Disp: , Rfl:  .  predniSONE (DELTASONE) 10 MG tablet, Take 5 daily for 2 days followed by 4,3,2 and 1 for 2 days each., Disp: 30 tablet, Rfl: 0 .  rosuvastatin (CRESTOR) 20 MG tablet, Take 1 tablet (20 mg total) by mouth daily., Disp: 90 tablet, Rfl: 0 .  sennosides-docusate sodium (SENOKOT-S) 8.6-50 MG tablet, Take 1 tablet by mouth at bedtime., Disp: , Rfl:  .  sertraline (ZOLOFT) 100 MG tablet, Take 1 tablet (100 mg total) by mouth daily., Disp: 90 tablet, Rfl: 3 .  sodium chloride 1 g tablet, Take 2 tablets (2 g total) by mouth 3 (three) times daily., Disp: 180 tablet, Rfl: 0 .  tamsulosin (FLOMAX) 0.4 MG CAPS capsule, TAKE (1) CAPSULE DAILY, Disp: 90 capsule, Rfl: 0 .  tiZANidine (ZANAFLEX) 2 MG tablet, , Disp: , Rfl:  .  valACYclovir (VALTREX) 1000 MG tablet, Take 1 tablet (1,000 mg total) by mouth 3 (three) times daily., Disp: 21 tablet, Rfl: 0 .  XARELTO 20 MG TABS tablet, Take 1 tablet (20 mg total) by mouth daily., Disp: 90 tablet, Rfl: 0  Assessment/ Plan: 84 y.o. female   1. Cough Responding to [employer]  This is been renewed.  We discussed signs and symptoms of infection reasons for evaluation.  She will follow-up as needed -  benzonatate (TESSALON PERLES) 100 MG capsule; Take 1 capsule (100 mg total) by mouth 3 (three) times daily as needed.  Dispense: 20 capsule; Refill: 0  2. Chronic constipation Responding well to MiraLAX.  Encourage p.o. hydration.  I reinforced directions of use for MiraLAX.   Start time: 2:08pm End time: 2:13pm  Total time spent on patient care (including telephone call/ virtual visit): 11 minutes  [place], [place] [phone]

## 2020-02-14 DIAGNOSIS — G629 Polyneuropathy, unspecified: Secondary | ICD-10-CM | POA: Diagnosis not present

## 2020-02-14 DIAGNOSIS — R5383 Other fatigue: Secondary | ICD-10-CM | POA: Diagnosis not present

## 2020-02-14 DIAGNOSIS — Z87891 Personal history of nicotine dependence: Secondary | ICD-10-CM | POA: Diagnosis not present

## 2020-02-14 DIAGNOSIS — C8590 Non-Hodgkin lymphoma, unspecified, unspecified site: Secondary | ICD-10-CM | POA: Diagnosis not present

## 2020-02-14 DIAGNOSIS — R590 Localized enlarged lymph nodes: Secondary | ICD-10-CM | POA: Diagnosis not present

## 2020-02-17 ENCOUNTER — Ambulatory Visit: Payer: Medicare Other | Admitting: Physician Assistant

## 2020-02-29 DIAGNOSIS — J45909 Unspecified asthma, uncomplicated: Secondary | ICD-10-CM | POA: Diagnosis not present

## 2020-03-01 ENCOUNTER — Encounter: Payer: Self-pay | Admitting: Family Medicine

## 2020-03-01 ENCOUNTER — Other Ambulatory Visit: Payer: Self-pay | Admitting: Cardiology

## 2020-03-01 ENCOUNTER — Ambulatory Visit (INDEPENDENT_AMBULATORY_CARE_PROVIDER_SITE_OTHER): Payer: Medicare Other | Admitting: Family Medicine

## 2020-03-01 DIAGNOSIS — R3989 Other symptoms and signs involving the genitourinary system: Secondary | ICD-10-CM

## 2020-03-01 MED ORDER — CIPROFLOXACIN HCL 500 MG PO TABS: 500.0000 mg | ORAL_TABLET | Freq: Two times a day (BID) | ORAL | 0 refills | Status: DC

## 2020-03-01 NOTE — Patient Instructions (Signed)
Urinary Tract Infection, Adult A urinary tract infection (UTI) is an infection of any part of the urinary tract. The urinary tract includes:  The kidneys.  The ureters.  The bladder.  The urethra. These organs make, store, and get rid of pee (urine) in the body. What are the causes? This is caused by germs (bacteria) in your genital area. These germs grow and cause swelling (inflammation) of your urinary tract. What increases the risk? You are more likely to develop this condition if:  You have a small, thin tube (catheter) to drain pee.  You cannot control when you pee or poop (incontinence).  You are female, and: ? You use these methods to prevent pregnancy:  A medicine that kills sperm (spermicide).  A device that blocks sperm (diaphragm). ? You have low levels of a female hormone (estrogen). ? You are pregnant.  You have genes that add to your risk.  You are sexually active.  You take antibiotic medicines.  You have trouble peeing because of: ? A prostate that is bigger than normal, if you are female. ? A blockage in the part of your body that drains pee from the bladder (urethra). ? A kidney stone. ? A nerve condition that affects your bladder (neurogenic bladder). ? Not getting enough to drink. ? Not peeing often enough.  You have other conditions, such as: ? Diabetes. ? A weak disease-fighting system (immune system). ? Sickle cell disease. ? Gout. ? Injury of the spine. What are the signs or symptoms? Symptoms of this condition include:  Needing to pee right away (urgently).  Peeing often.  Peeing small amounts often.  Pain or burning when peeing.  Blood in the pee.  Pee that smells bad or not like normal.  Trouble peeing.  Pee that is cloudy.  Fluid coming from the vagina, if you are female.  Pain in the belly or lower back. Other symptoms include:  Throwing up (vomiting).  No urge to eat.  Feeling mixed up (confused).  Being tired  and grouchy (irritable).  A fever.  Watery poop (diarrhea). How is this treated? This condition may be treated with:  Antibiotic medicine.  Other medicines.  Drinking enough water. Follow these instructions at home:  Medicines  Take over-the-counter and prescription medicines only as told by your doctor.  If you were prescribed an antibiotic medicine, take it as told by your doctor. Do not stop taking it even if you start to feel better. General instructions  Make sure you: ? Pee until your bladder is empty. ? Do not hold pee for a long time. ? Empty your bladder after sex. ? Wipe from front to back after pooping if you are a female. Use each tissue one time when you wipe.  Drink enough fluid to keep your pee pale yellow.  Keep all follow-up visits as told by your doctor. This is important. Contact a doctor if:  You do not get better after 1-2 days.  Your symptoms go away and then come back. Get help right away if:  You have very bad back pain.  You have very bad pain in your lower belly.  You have a fever.  You are sick to your stomach (nauseous).  You are throwing up. Summary  A urinary tract infection (UTI) is an infection of any part of the urinary tract.  This condition is caused by germs in your genital area.  There are many risk factors for a UTI. These include having a small, thin   tube to drain pee and not being able to control when you pee or poop.  Treatment includes antibiotic medicines for germs.  Drink enough fluid to keep your pee pale yellow. This information is not intended to replace advice given to you by your health care provider. Make sure you discuss any questions you have with your health care provider. Document Revised: 11/19/2018 Document Reviewed: 06/11/2018 Elsevier Patient Education  2020 [employer]  

## 2020-03-01 NOTE — Progress Notes (Signed)
Virtual Visit via Telephone Note  I connected with [name] on 03/01/20 at 3:03 PM by telephone and verified that I am speaking with the correct person using two identifiers. [name] is currently located at home and nobody is currently with her during this visit. The provider, Loman Brooklyn, FNP is located in their home at time of visit.  I discussed the limitations, risks, security and privacy concerns of performing an evaluation and management service by telephone and the availability of in person appointments. I also discussed with the patient that there may be a patient responsible charge related to this service. The patient expressed understanding and agreed to proceed.  Subjective: PCP: Janora Norlander, DO  Chief Complaint  Patient presents with  . Urinary Tract Infection   Urinary Tract Infection: Patient complains of dysuria, frequency and incontinence She has had symptoms for 2 days. Patient also complains of back pain. Patient denies fever. Patient does have a history of recurrent UTI.     ROS: Per HPI  Current Outpatient Medications:  .  albuterol (VENTOLIN HFA) 108 (90 Base) MCG/ACT inhaler, Inhale 2 puffs into the lungs every 4 (four) hours as needed for wheezing or shortness of breath., Disp: 6.7 g, Rfl: 0 .  ALPRAZolam (XANAX) 1 MG tablet, Take 0.5-1 tablets (0.5-1 mg total) by mouth 2 (two) times daily as needed for anxiety., Disp: 60 tablet, Rfl: 3 .  Ascorbic Acid (VITAMIN C) 1000 MG tablet, Take 1,000 mg by mouth daily., Disp: , Rfl:  .  benzonatate (TESSALON PERLES) 100 MG capsule, Take 1 capsule (100 mg total) by mouth 3 (three) times daily as needed., Disp: 20 capsule, Rfl: 0 .  beta carotene 25000 UNIT capsule, Take 25,000 Units by mouth daily., Disp: , Rfl:  .  budesonide (PULMICORT) 0.5 MG/2ML nebulizer solution, Take 2 mLs (0.5 mg total) by nebulization 2 (two) times daily., Disp: 120 mL, Rfl: 1 .  buprenorphine (BUTRANS) 5 MCG/HR PTWK, Butrans  5 mcg/hour transdermal patch  Apply 1 patch every week by transdermal route., Disp: , Rfl:  .  calcium carbonate (TUMS - DOSED IN MG ELEMENTAL CALCIUM) 500 MG chewable tablet, Chew 1 tablet by mouth 3 (three) times daily as needed for indigestion or heartburn., Disp: , Rfl:  .  Calcium Carbonate-Vitamin D (CALCIUM 600+D) 600-400 MG-UNIT per tablet, Take 1 tablet by mouth 2 (two) times daily. , Disp: , Rfl:  .  Coenzyme Q10 10 MG capsule, Take 10 mg by mouth daily., Disp: , Rfl:  .  cyclobenzaprine (FLEXERIL) 10 MG tablet, Take 10 mg by mouth 3 (three) times daily as needed., Disp: , Rfl:  .  denosumab (PROLIA) 60 MG/ML SOSY injection, Inject 60 mg every 6 (six) months into the skin. Administer in upper arm, thigh, or abdomen, Disp: 1 mL, Rfl: 0 .  dicyclomine (BENTYL) 10 MG capsule, Take 1 capsule (10 mg total) by mouth 4 (four) times daily as needed for spasms., Disp: 30 capsule, Rfl: 0 .  diltiazem (CARDIZEM) 120 MG tablet, Take 1 tablet (120 mg total) by mouth daily., Disp: 30 tablet, Rfl: 0 .  fluticasone (FLONASE) 50 MCG/ACT nasal spray, Place 2 sprays into both nostrils daily., Disp: , Rfl:  .  HYDROcodone-acetaminophen (NORCO/VICODIN) 5-325 MG tablet, Take 1 tablet by mouth every 4 (four) hours as needed for moderate pain., Disp: 10 tablet, Rfl: 0 .  levothyroxine (SYNTHROID) 25 MCG tablet, Take 1 tablet (25 mcg total) by mouth daily before breakfast., Disp:  30 tablet, Rfl: 5 .  linaclotide (LINZESS) 145 MCG CAPS capsule, Take 1 capsule (145 mcg total) by mouth daily. To regulate bowel movements, Disp: 30 capsule, Rfl: 5 .  loratadine (CLARITIN) 10 MG tablet, Take 1 tablet (10 mg total) by mouth daily., Disp: 90 tablet, Rfl: 0 .  meclizine (ANTIVERT) 25 MG tablet, Take 1 tablet (25 mg total) by mouth 3 (three) times daily as needed for dizziness., Disp: 30 tablet, Rfl: 0 .  Melatonin 5 MG TABS, Take 10 mg by mouth every evening., Disp: , Rfl:  .  metoprolol tartrate (LOPRESSOR) 25 MG tablet,  Take 2 tablets (50 mg total) by mouth daily. Take 2 tablets (50 mg) by mouth once daily at 8:00 am, Disp: 60 tablet, Rfl: 0 .  metoprolol tartrate (LOPRESSOR) 25 MG tablet, Take 1 tablet (25 mg total) by mouth every evening., Disp: 30 tablet, Rfl: 0 .  montelukast (SINGULAIR) 10 MG tablet, Take 1 tablet (10 mg total) by mouth daily., Disp: 90 tablet, Rfl: 0 .  Multiple Vitamin (MULTIVITAMIN) tablet, Take 1 tablet by mouth daily., Disp: , Rfl:  .  nitroGLYCERIN (NITROSTAT) 0.4 MG SL tablet, Place 1 tablet (0.4 mg total) under the tongue every 5 (five) minutes as needed for chest pain., Disp: 30 tablet, Rfl: 0 .  NON FORMULARY, Diet Type: NAS, Disp: , Rfl:  .  omega-3 acid ethyl esters (LOVAZA) 1 g capsule, Take 2 capsules (2 g total) by mouth 2 (two) times daily., Disp: 120 capsule, Rfl: 0 .  omeprazole-sodium bicarbonate (ZEGERID) 40-1100 MG capsule, Take 1 capsule by mouth daily., Disp: 30 capsule, Rfl: 1 .  ondansetron (ZOFRAN) 4 MG tablet, Take 1 tablet (4 mg total) by mouth every 6 (six) hours as needed for nausea or vomiting., Disp: 20 tablet, Rfl: 0 .  polyethylene glycol (MIRALAX / GLYCOLAX) 17 g packet, Take 17 g by mouth daily., Disp: , Rfl:  .  predniSONE (DELTASONE) 10 MG tablet, Take 5 daily for 2 days followed by 4,3,2 and 1 for 2 days each., Disp: 30 tablet, Rfl: 0 .  rosuvastatin (CRESTOR) 20 MG tablet, Take 1 tablet (20 mg total) by mouth daily., Disp: 90 tablet, Rfl: 0 .  sennosides-docusate sodium (SENOKOT-S) 8.6-50 MG tablet, Take 1 tablet by mouth at bedtime., Disp: , Rfl:  .  sertraline (ZOLOFT) 100 MG tablet, Take 1 tablet (100 mg total) by mouth daily., Disp: 90 tablet, Rfl: 3 .  sodium chloride 1 g tablet, Take 2 tablets (2 g total) by mouth 3 (three) times daily., Disp: 180 tablet, Rfl: 0 .  tamsulosin (FLOMAX) 0.4 MG CAPS capsule, TAKE (1) CAPSULE DAILY, Disp: 90 capsule, Rfl: 0 .  tiZANidine (ZANAFLEX) 2 MG tablet, , Disp: , Rfl:  .  valACYclovir (VALTREX) 1000 MG tablet,  Take 1 tablet (1,000 mg total) by mouth 3 (three) times daily., Disp: 21 tablet, Rfl: 0 .  XARELTO 20 MG TABS tablet, Take 1 tablet (20 mg total) by mouth daily., Disp: 90 tablet, Rfl: 0  Allergies  Allergen Reactions  . Naproxen Other (See Comments)    Tongue swelling  . Penicillins Swelling    Swelling around site Has patient had a PCN reaction causing immediate rash, facial/tongue/throat swelling, SOB or lightheadedness with hypotension: Yes Has patient had a PCN reaction causing severe rash involving mucus membranes or skin necrosis: No Has patient had a PCN reaction that required hospitalization: No Has patient had a PCN reaction occurring within the last 10 years: No If all  of the above answers are "NO", then may proceed with Cephalosporin use.   . Sulfonamide Derivatives Hives  . Aspirin Other (See Comments)    REACTION: regular strength causes "heart to beat fast"  . Captopril Hypertension  . Strawberry Extract Other (See Comments)  . Clindamycin/Lincomycin Other (See Comments)    unknown   Past Medical History:  Diagnosis Date  . Anxiety   . Arthritis   . Asthmatic bronchitis   . Atrial fibrillation ([place])   . Bowel obstruction (HCC)    blockage  . CAD (coronary artery disease)    Stent to RI 2003.  Myoview 2013 no ischemia.  . Cataract   . Chronic back pain   . Chronic bronchitis ([place])   . Colon polyp    adenomatous  . Congestive heart disease (HCC)    Preserved EF  . Depression   . Esophageal motility disorder   . Fibromyalgia   . GERD (gastroesophageal reflux disease)   . History of hiatal hernia   . History of kidney stones   . Hyperlipidemia   . Hypertension   . Hypothyroid   . 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 Nephrolithiasis   . OA (osteoarthritis)   . Status post dilation of esophageal narrowing     Observations/Objective: A&O  No respiratory distress or wheezing audible over the phone Mood, judgement, and  thought processes all WNL   Assessment and Plan: 1. Suspected UTI - Education provided on UTIs. Encouraged adequate hydration.  - ciprofloxacin (CIPRO) 500 MG tablet; Take 1 tablet (500 mg total) by mouth 2 (two) times daily for 7 days.  Dispense: 14 tablet; Refill: 0   Follow Up Instructions:  I discussed the assessment and treatment plan with the patient. The patient was provided an opportunity to ask questions and all were answered. The patient agreed with the plan and demonstrated an understanding of the instructions.   The patient was advised to call back or seek an in-person evaluation if the symptoms worsen or if the condition fails to improve as anticipated.  The above assessment and management plan was discussed with the patient. The patient verbalized understanding of and has agreed to the management plan. Patient is aware to call the clinic if symptoms persist or worsen. Patient is aware when to return to the clinic for a follow-up visit. Patient educated on when it is appropriate to go to the emergency department.   Time call ended: 3:08 PM  I provided 7 minutes of non-face-to-face time during this encounter.  Hendricks Limes, MSN, APRN, FNP-C [facility] 03/01/20

## 2020-03-02 ENCOUNTER — Telehealth: Payer: Self-pay | Admitting: Cardiology

## 2020-03-02 MED ORDER — METOPROLOL TARTRATE 25 MG PO TABS: ORAL_TABLET | ORAL | 0 refills | Status: DC

## 2020-03-02 NOTE — Telephone Encounter (Signed)
New message   [facility] pharmacy has question about metoprolol tartrate (LOPRESSOR) 25 MG tablet. This medication is being is denied it states prescriber not associated with location. Please call to discuss.

## 2020-03-02 NOTE — Telephone Encounter (Signed)
Spoke with the pt in RE; to the RX request from [facility] for her metoprolol.... Pt says she still needs to see Dr. Percival Spanish... he si her current Cardiologist... she has been dealing with Lymphoma this pat year.   She made an appt wth Dr. Percival Spanish in [place] for 04/05/20 and did not want to be seen sooner... RX called in for her Metoprolol.

## 2020-03-08 ENCOUNTER — Ambulatory Visit (INDEPENDENT_AMBULATORY_CARE_PROVIDER_SITE_OTHER): Payer: Medicare Other | Admitting: Family Medicine

## 2020-03-08 ENCOUNTER — Other Ambulatory Visit: Payer: Self-pay

## 2020-03-08 VITALS — BP 132/78 | HR 65 | Temp 96.3°F

## 2020-03-08 DIAGNOSIS — C8590 Non-Hodgkin lymphoma, unspecified, unspecified site: Secondary | ICD-10-CM | POA: Insufficient documentation

## 2020-03-08 DIAGNOSIS — E039 Hypothyroidism, unspecified: Secondary | ICD-10-CM | POA: Diagnosis not present

## 2020-03-08 DIAGNOSIS — F411 Generalized anxiety disorder: Secondary | ICD-10-CM

## 2020-03-08 DIAGNOSIS — E782 Mixed hyperlipidemia: Secondary | ICD-10-CM

## 2020-03-08 DIAGNOSIS — R300 Dysuria: Secondary | ICD-10-CM

## 2020-03-08 DIAGNOSIS — D649 Anemia, unspecified: Secondary | ICD-10-CM

## 2020-03-08 DIAGNOSIS — I480 Paroxysmal atrial fibrillation: Secondary | ICD-10-CM | POA: Diagnosis not present

## 2020-03-08 DIAGNOSIS — E559 Vitamin D deficiency, unspecified: Secondary | ICD-10-CM

## 2020-03-08 DIAGNOSIS — Z79891 Long term (current) use of opiate analgesic: Secondary | ICD-10-CM | POA: Diagnosis not present

## 2020-03-08 MED ORDER — ALPRAZOLAM 1 MG PO TABS: 0.5000 mg | ORAL_TABLET | Freq: Every evening | ORAL | 2 refills | Status: DC | PRN

## 2020-03-08 NOTE — Progress Notes (Signed)
Subjective: CC: thyroid, anemia PCP: Janora Norlander, DO WLN:[id number] [name] is a 84 y.o. female presenting to clinic today for:  1. Hypothyroidism Patient reports compliance with Synthroid 25 mcg daily.  She does report occasional palpitations that she thinks may be her atrial fibrillation acting up.  She denies any unplanned weight loss, change in stool habits.  2. Afib Reports compliance with medications as prescribed by her cardiologist.  Again has had some intermittent palpitations.  She has chronic dyspnea on exertion.  3. GAD Patient reports that she has been taking half a tablet during the daytime intermittently but not every day.  Most times she only takes 1/2-1 full tablet at bedtime for sleep.  She denies any excessive daytime sleepiness, memory changes.  She has had frequent falls.  She goes on to state that she is coming off of pain medication.  She does not want to be on any controlled substances for pain anymore.  4.  Dysuria Patient is completing a course of Cipro after having dysuria.  She notes that she continues to have dysuria and urinary frequency.  She brings in a bottle of urine today for testing.  No hematuria.  No fevers.  No nausea or vomiting.  ROS: Per HPI  Allergies  Allergen Reactions  . Naproxen Other (See Comments)    Tongue swelling  . Penicillins Swelling    Swelling around site Has patient had a PCN reaction causing immediate rash, facial/tongue/throat swelling, SOB or lightheadedness with hypotension: Yes Has patient had a PCN reaction causing severe rash involving mucus membranes or skin necrosis: No Has patient had a PCN reaction that required hospitalization: No Has patient had a PCN reaction occurring within the last 10 years: No If all of the above answers are "NO", then may proceed with Cephalosporin use.   . Sulfonamide Derivatives Hives  . Aspirin Other (See Comments)    REACTION: regular strength causes "heart to beat fast"  .  Captopril Hypertension  . Strawberry Extract Other (See Comments)  . Clindamycin/Lincomycin Other (See Comments)    unknown   Past Medical History:  Diagnosis Date  . Anxiety   . Arthritis   . Asthmatic bronchitis   . Atrial fibrillation ([place])   . Bowel obstruction (HCC)    blockage  . CAD (coronary artery disease)    Stent to RI 2003.  Myoview 2013 no ischemia.  . Cataract   . Chronic back pain   . Chronic bronchitis ([place])   . Colon polyp    adenomatous  . Congestive heart disease (HCC)    Preserved EF  . Depression   . Esophageal motility disorder   . Fibromyalgia   . GERD (gastroesophageal reflux disease)   . History of hiatal hernia   . History of kidney stones   . Hyperlipidemia   . Hypertension   . Hypothyroid   . 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 Nephrolithiasis   . OA (osteoarthritis)   . Status post dilation of esophageal narrowing     Current Outpatient Medications:  .  albuterol (VENTOLIN HFA) 108 (90 Base) MCG/ACT inhaler, Inhale 2 puffs into the lungs every 4 (four) hours as needed for wheezing or shortness of breath., Disp: 6.7 g, Rfl: 0 .  ALPRAZolam (XANAX) 1 MG tablet, Take 0.5-1 tablets (0.5-1 mg total) by mouth 2 (two) times daily as needed for anxiety., Disp: 60 tablet, Rfl: 3 .  Ascorbic Acid (VITAMIN C) 1000 MG  tablet, Take 1,000 mg by mouth daily., Disp: , Rfl:  .  beta carotene 25000 UNIT capsule, Take 25,000 Units by mouth daily., Disp: , Rfl:  .  budesonide (PULMICORT) 0.5 MG/2ML nebulizer solution, Take 2 mLs (0.5 mg total) by nebulization 2 (two) times daily., Disp: 120 mL, Rfl: 1 .  calcium carbonate (TUMS - DOSED IN MG ELEMENTAL CALCIUM) 500 MG chewable tablet, Chew 1 tablet by mouth 3 (three) times daily as needed for indigestion or heartburn., Disp: , Rfl:  .  Coenzyme Q10 10 MG capsule, Take 10 mg by mouth daily., Disp: , Rfl:  .  cyclobenzaprine (FLEXERIL) 10 MG tablet, Take 10 mg by mouth 3 (three) times  daily as needed., Disp: , Rfl:  .  denosumab (PROLIA) 60 MG/ML SOSY injection, Inject 60 mg every 6 (six) months into the skin. Administer in upper arm, thigh, or abdomen, Disp: 1 mL, Rfl: 0 .  dicyclomine (BENTYL) 10 MG capsule, Take 1 capsule (10 mg total) by mouth 4 (four) times daily as needed for spasms., Disp: 30 capsule, Rfl: 0 .  fluticasone (FLONASE) 50 MCG/ACT nasal spray, Place 2 sprays into both nostrils daily., Disp: , Rfl:  .  levothyroxine (SYNTHROID) 25 MCG tablet, Take 1 tablet (25 mcg total) by mouth daily before breakfast., Disp: 30 tablet, Rfl: 5 .  linaclotide (LINZESS) 145 MCG CAPS capsule, Take 1 capsule (145 mcg total) by mouth daily. To regulate bowel movements, Disp: 30 capsule, Rfl: 5 .  loratadine (CLARITIN) 10 MG tablet, Take 1 tablet (10 mg total) by mouth daily., Disp: 90 tablet, Rfl: 0 .  meclizine (ANTIVERT) 25 MG tablet, Take 1 tablet (25 mg total) by mouth 3 (three) times daily as needed for dizziness., Disp: 30 tablet, Rfl: 0 .  Melatonin 5 MG TABS, Take 10 mg by mouth every evening., Disp: , Rfl:  .  metoprolol tartrate (LOPRESSOR) 25 MG tablet, Take 2 tablets (50 mg) in the morning and one tablet (25 mg) at night., Disp: 90 tablet, Rfl: 0 .  montelukast (SINGULAIR) 10 MG tablet, Take 1 tablet (10 mg total) by mouth daily., Disp: 90 tablet, Rfl: 0 .  Multiple Vitamin (MULTIVITAMIN) tablet, Take 1 tablet by mouth daily., Disp: , Rfl:  .  nitroGLYCERIN (NITROSTAT) 0.4 MG SL tablet, Place 1 tablet (0.4 mg total) under the tongue every 5 (five) minutes as needed for chest pain., Disp: 30 tablet, Rfl: 0 .  omega-3 acid ethyl esters (LOVAZA) 1 g capsule, Take 2 capsules (2 g total) by mouth 2 (two) times daily., Disp: 120 capsule, Rfl: 0 .  omeprazole-sodium bicarbonate (ZEGERID) 40-1100 MG capsule, Take 1 capsule by mouth daily., Disp: 30 capsule, Rfl: 1 .  ondansetron (ZOFRAN) 4 MG tablet, Take 1 tablet (4 mg total) by mouth every 6 (six) hours as needed for nausea or  vomiting., Disp: 20 tablet, Rfl: 0 .  polyethylene glycol (MIRALAX / GLYCOLAX) 17 g packet, Take 17 g by mouth daily., Disp: , Rfl:  .  rosuvastatin (CRESTOR) 20 MG tablet, Take 1 tablet (20 mg total) by mouth daily., Disp: 90 tablet, Rfl: 0 .  sennosides-docusate sodium (SENOKOT-S) 8.6-50 MG tablet, Take 1 tablet by mouth at bedtime., Disp: , Rfl:  .  sertraline (ZOLOFT) 100 MG tablet, Take 1 tablet (100 mg total) by mouth daily., Disp: 90 tablet, Rfl: 3 .  XARELTO 20 MG TABS tablet, Take 1 tablet (20 mg total) by mouth daily., Disp: 90 tablet, Rfl: 0 .  buprenorphine (BUTRANS) 10 MCG/HR  PTWK patch, Butrans 10 mcg/hour transdermal patch  Apply 1 patch every week by transdermal route., Disp: , Rfl:  .  diltiazem (CARDIZEM CD) 120 MG 24 hr capsule, Take 120 mg by mouth daily., Disp: , Rfl:  .  tamsulosin (FLOMAX) 0.4 MG CAPS capsule, TAKE (1) CAPSULE DAILY, Disp: 90 capsule, Rfl: 0 Social History   Socioeconomic History  . Marital status: Widowed    Spouse name: Not on file  . Number of children: 2  . Years of education: Not on file  . Highest education level: Not on file  Occupational History  . Occupation: Retired  Tobacco Use  . Smoking status: Former Smoker    Packs/day: 1.00    Years: 30.00    Pack years: 30.00    Types: Cigarettes    Quit date: 12/16/1989    Years since quitting: 30.2  . Smokeless tobacco: Never Used  . Tobacco comment: smoked off & on  Substance and Sexual Activity  . Alcohol use: No    Alcohol/week: 0.0 standard drinks  . Drug use: No  . Sexual activity: Not Currently  Other Topics Concern  . Not on file  Social History Narrative   Originally from [place]. Always lived in [place]. No international travel. Has prior travel to [place], [place], [place], [place], [place], & [place]. No recent travel. She has a cat currently. No prior bird, mold, or hot tub exposure. 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 No known asbestos  exposure. 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    Social Determinants of Health   Financial Resource Strain:   . Difficulty of Paying Living Expenses:   Food Insecurity:   . Worried About Charity fundraiser in the Last Year:   . Arboriculturist in the Last Year:   Transportation Needs:   . Film/video editor (Medical):   Marland Kitchen Lack of Transportation (Non-Medical):   Physical Activity:   . Days of Exercise per Week:   . Minutes of Exercise per Session:   Stress:   . Feeling of Stress :   Social Connections:   . Frequency of Communication with Friends and Family:   . Frequency of Social Gatherings with Friends and Family:   . Attends Religious Services:   . Active Member of Clubs or Organizations:   . Attends Archivist Meetings:   Marland Kitchen Marital Status:   Intimate Partner Violence:   . Fear of Current or Ex-Partner:   . Emotionally Abused:   Marland Kitchen Physically Abused:   . Sexually Abused:    Family History  Problem Relation Age of Onset  . Prostate cancer Brother   . Cancer Brother        PROSTATE  . Heart disease Mother   . Asthma Mother   . Congestive Heart Failure Mother   . Emphysema Sister   . COPD Sister   . Stroke Sister   . Heart disease Sister   . Emphysema Brother   . Rheumatologic disease Neg Hx     Objective: Office vital signs reviewed. BP 132/78   Pulse 65   Temp (!) 96.3 F (35.7 C) (Temporal)   SpO2 98%   Physical Examination:  General: Awake, alert, well appearing, No acute distress HEENT: Normal, MMM; no exophthalmos.  No goiter Cardio: bradycardic but seemingly regular rhythm, S1S2 heard, no murmurs appreciated Pulm: clear to auscultation bilaterally, no wheezes, rhonchi or rales; normal  work of breathing on room air MSK: arrives in wheelchair Skin: dry; intact; no rashes or lesions; normal temperature Neuro: No tremor but does seem somewhat fidgety in the lower extremities Psych: Mood stable, speech normal, affect  appropriate, pleasant and interactive  Assessment/ Plan: 84 y.o. female   1. Acquired hypothyroidism Check thyroid panel.  Has had intermittent heart palpitations - Thyroid Panel With TSH  2. Paroxysmal atrial fibrillation (HCC) - CMP14+EGFR - CBC with Differential  3. Anemia, unspecified type - CBC with Differential  4. Vitamin D deficiency - VITAMIN D 25 Hydroxy (Vit-D Deficiency, Fractures)  5. Mixed hyperlipidemia Check nonfasting lipid - Lipid Panel  6. Generalized anxiety disorder Stable.  I am going to reduce her quantity to reflect current usage.  She was unfortunately unable to leave a urine specimen for UDS today.  Collection has been attempted several times so far so we will go ahead and proceed with blood draw over UDS so that we may comply with office policy.  Her controlled substance contract is good until August - ALPRAZolam (XANAX) 1 MG tablet; Take 0.5-1 tablets (0.5-1 mg total) by mouth at bedtime as needed for anxiety. May use 1/2 tablet during the day IF needed for panic attack.  Dispense: 45 tablet; Refill: 2 - Drug Screen 10 W/Conf, Se  7. 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  Being monitored closely by hematology  8. Dysuria Possibly dirty urine collection given home collection and nonsterile bottle. - Urinalysis   Orders Placed This Encounter  Procedures  . VITAMIN D 25 Hydroxy (Vit-D Deficiency, Fractures)  . CMP14+EGFR  . Lipid Panel  . Thyroid Panel With TSH  . CBC with Differential  . ToxASSURE Select 13 (MW), Urine   No orders of the defined types were placed in this encounter.    Janora Norlander, DO [place] [phone]

## 2020-03-08 NOTE — Patient Instructions (Signed)
I want you to start backing down on your Xanax to ONLY at night time.  I will give you a few pills to use during the day if needed but I want you to use these SPARINGLY.    Given your frequent falls/ broken bones and need for pain medication, we have to start backing down on the Xanax.  You had labs performed today.  You will be contacted with the results of the labs once they are available, usually in the next 3 business days for routine lab work.  If you have an active my chart account, they will be released to your MyChart.  If you prefer to have these labs released to you via telephone, please let us know.  If you had a pap smear or biopsy performed, expect to be contacted in about 7-10 days.

## 2020-03-10 ENCOUNTER — Other Ambulatory Visit: Payer: Self-pay | Admitting: Family Medicine

## 2020-03-10 MED ORDER — LEVOTHYROXINE SODIUM 25 MCG PO TABS: 25.0000 ug | ORAL_TABLET | Freq: Every day | ORAL | 5 refills | Status: DC

## 2020-03-13 DIAGNOSIS — C8301 Small cell B-cell lymphoma, lymph nodes of head, face, and neck: Secondary | ICD-10-CM | POA: Diagnosis not present

## 2020-03-13 DIAGNOSIS — C8351 Lymphoblastic (diffuse) lymphoma, lymph nodes of head, face, and neck: Secondary | ICD-10-CM | POA: Diagnosis not present

## 2020-03-13 DIAGNOSIS — C8590 Non-Hodgkin lymphoma, unspecified, unspecified site: Secondary | ICD-10-CM | POA: Diagnosis not present

## 2020-03-15 LAB — CMP14+EGFR
ALT: 9 IU/L (ref 0–32)
AST: 24 IU/L (ref 0–40)
Albumin/Globulin Ratio: 0.8 — ABNORMAL LOW (ref 1.2–2.2)
Albumin: 3.8 g/dL (ref 3.6–4.6)
Alkaline Phosphatase: 93 IU/L (ref 39–117)
BUN/Creatinine Ratio: 14 (ref 12–28)
BUN: 9 mg/dL (ref 8–27)
Bilirubin Total: 0.4 mg/dL (ref 0.0–1.2)
CO2: 23 mmol/L (ref 20–29)
Calcium: 9 mg/dL (ref 8.7–10.3)
Chloride: 98 mmol/L (ref 96–106)
Creatinine, Ser: 0.66 mg/dL (ref 0.57–1.00)
GFR calc Af Amer: 96 mL/min/{1.73_m2} (ref >59)
GFR calc non Af Amer: 83 mL/min/{1.73_m2} (ref >59)
Globulin, Total: 5 g/dL — ABNORMAL HIGH (ref 1.5–4.5)
Glucose: 77 mg/dL (ref 65–99)
Potassium: 4.4 mmol/L (ref 3.5–5.2)
Sodium: 132 mmol/L — ABNORMAL LOW (ref 134–144)
Total Protein: 8.8 g/dL — ABNORMAL HIGH (ref 6.0–8.5)

## 2020-03-15 LAB — CBC WITH DIFFERENTIAL/PLATELET
Basophils Absolute: 0.1 10*3/uL (ref 0.0–0.2)
Basos: 1 %
EOS (ABSOLUTE): 0.2 10*3/uL (ref 0.0–0.4)
Eos: 2 %
Hematocrit: 33.8 % — ABNORMAL LOW (ref 34.0–46.6)
Hemoglobin: 11.5 g/dL (ref 11.1–15.9)
Immature Grans (Abs): 0 10*3/uL (ref 0.0–0.1)
Immature Granulocytes: 0 %
Lymphocytes Absolute: 6.1 10*3/uL — ABNORMAL HIGH (ref 0.7–3.1)
Lymphs: 62 %
MCH: 33 pg (ref 26.6–33.0)
MCHC: 34 g/dL (ref 31.5–35.7)
MCV: 97 fL (ref 79–97)
Monocytes Absolute: 0.6 10*3/uL (ref 0.1–0.9)
Monocytes: 6 %
Neutrophils Absolute: 2.9 10*3/uL (ref 1.4–7.0)
Neutrophils: 29 %
Platelets: 264 10*3/uL (ref 150–450)
RBC: 3.48 x10E6/uL — ABNORMAL LOW (ref 3.77–5.28)
RDW: 14.1 % (ref 11.7–15.4)
WBC: 9.9 10*3/uL (ref 3.4–10.8)

## 2020-03-15 LAB — BENZODIAZEPINES,MS,WB/SP RFX
7-Aminoclonazepam: NEGATIVE ng/mL
Alprazolam: 53 ng/mL
Benzodiazepines Confirm: POSITIVE
Chlordiazepoxide: NEGATIVE ng/mL
Clonazepam: NEGATIVE ng/mL
Desalkylflurazepam: NEGATIVE ng/mL
Desmethylchlordiazepoxide: NEGATIVE ng/mL
Desmethyldiazepam: NEGATIVE ng/mL
Diazepam: NEGATIVE ng/mL
Flurazepam: NEGATIVE ng/mL
Lorazepam: NEGATIVE ng/mL
Midazolam: NEGATIVE ng/mL
Oxazepam: NEGATIVE ng/mL
Temazepam: NEGATIVE ng/mL
Triazolam: NEGATIVE ng/mL

## 2020-03-15 LAB — DRUG SCREEN 10 W/CONF, SERUM
Amphetamines, IA: NEGATIVE ng/mL
Barbiturates, IA: NEGATIVE ug/mL
Benzodiazepines, IA: POSITIVE ng/mL — AB
Cocaine & Metabolite, IA: NEGATIVE ng/mL
Methadone, IA: NEGATIVE ng/mL
Opiates, IA: NEGATIVE ng/mL
Oxycodones, IA: NEGATIVE ng/mL
Phencyclidine, IA: NEGATIVE ng/mL
Propoxyphene, IA: NEGATIVE ng/mL
THC(Marijuana) Metabolite, IA: NEGATIVE ng/mL

## 2020-03-15 LAB — VITAMIN D 25 HYDROXY (VIT D DEFICIENCY, FRACTURES): Vit D, 25-Hydroxy: 60.6 ng/mL (ref 30.0–100.0)

## 2020-03-15 LAB — LIPID PANEL
Chol/HDL Ratio: 2.9 ratio (ref 0.0–4.4)
Cholesterol, Total: 115 mg/dL (ref 100–199)
HDL: 40 mg/dL (ref >39)
LDL Chol Calc (NIH): 53 mg/dL (ref 0–99)
Triglycerides: 122 mg/dL (ref 0–149)
VLDL Cholesterol Cal: 22 mg/dL (ref 5–40)

## 2020-03-15 LAB — THYROID PANEL WITH TSH
Free Thyroxine Index: 1.8 (ref 1.2–4.9)
T3 Uptake Ratio: 25 % (ref 24–39)
T4, Total: 7.1 ug/dL (ref 4.5–12.0)
TSH: 1.63 u[IU]/mL (ref 0.450–4.500)

## 2020-03-16 ENCOUNTER — Other Ambulatory Visit: Payer: Self-pay | Admitting: Family Medicine

## 2020-03-16 ENCOUNTER — Telehealth: Payer: Self-pay | Admitting: Family Medicine

## 2020-03-16 MED ORDER — LEVOTHYROXINE SODIUM 25 MCG PO TABS: 50.0000 ug | ORAL_TABLET | Freq: Every day | ORAL | 5 refills | Status: DC

## 2020-03-16 NOTE — Telephone Encounter (Signed)
This has been corrected.

## 2020-03-16 NOTE — Telephone Encounter (Signed)
Pt called stating that she is out of her Thyroid medicine and when she called the pharmacy to get a refill she was told that she shouldn't be out and told the pharmacist that she has been taking 2 pills everyday for her thyroid like she always has. Pt says she was not aware that the directions for her thyroid medicine changed, and she was only supposed to be taking 1 pill daily.  Spoke with Stew from Pharmacy who confirmed this conversation.   Recent lab work checked on 03/08/20 reflects patient taking 2 Thyroid pills each day.

## 2020-03-20 ENCOUNTER — Other Ambulatory Visit: Payer: Self-pay | Admitting: Family Medicine

## 2020-03-20 DIAGNOSIS — R296 Repeated falls: Secondary | ICD-10-CM | POA: Diagnosis not present

## 2020-03-20 DIAGNOSIS — R531 Weakness: Secondary | ICD-10-CM | POA: Diagnosis not present

## 2020-03-20 DIAGNOSIS — M549 Dorsalgia, unspecified: Secondary | ICD-10-CM | POA: Diagnosis not present

## 2020-03-20 DIAGNOSIS — R413 Other amnesia: Secondary | ICD-10-CM | POA: Diagnosis not present

## 2020-03-20 DIAGNOSIS — Z9181 History of falling: Secondary | ICD-10-CM | POA: Diagnosis not present

## 2020-03-20 DIAGNOSIS — C8590 Non-Hodgkin lymphoma, unspecified, unspecified site: Secondary | ICD-10-CM | POA: Diagnosis not present

## 2020-03-20 DIAGNOSIS — G8929 Other chronic pain: Secondary | ICD-10-CM | POA: Diagnosis not present

## 2020-03-21 DIAGNOSIS — L84 Corns and callosities: Secondary | ICD-10-CM | POA: Diagnosis not present

## 2020-03-21 DIAGNOSIS — B351 Tinea unguium: Secondary | ICD-10-CM | POA: Diagnosis not present

## 2020-03-21 DIAGNOSIS — M79671 Pain in right foot: Secondary | ICD-10-CM | POA: Diagnosis not present

## 2020-03-30 DIAGNOSIS — M4316 Spondylolisthesis, lumbar region: Secondary | ICD-10-CM | POA: Diagnosis not present

## 2020-03-30 DIAGNOSIS — M5134 Other intervertebral disc degeneration, thoracic region: Secondary | ICD-10-CM | POA: Diagnosis not present

## 2020-03-30 DIAGNOSIS — M47814 Spondylosis without myelopathy or radiculopathy, thoracic region: Secondary | ICD-10-CM | POA: Diagnosis not present

## 2020-03-30 DIAGNOSIS — M438X6 Other specified deforming dorsopathies, lumbar region: Secondary | ICD-10-CM | POA: Diagnosis not present

## 2020-03-30 DIAGNOSIS — Z982 Presence of cerebrospinal fluid drainage device: Secondary | ICD-10-CM | POA: Diagnosis not present

## 2020-03-30 DIAGNOSIS — R29890 Loss of height: Secondary | ICD-10-CM | POA: Diagnosis not present

## 2020-03-30 DIAGNOSIS — G9589 Other specified diseases of spinal cord: Secondary | ICD-10-CM | POA: Diagnosis not present

## 2020-03-30 DIAGNOSIS — M545 Low back pain: Secondary | ICD-10-CM | POA: Diagnosis not present

## 2020-03-30 DIAGNOSIS — Z9181 History of falling: Secondary | ICD-10-CM | POA: Diagnosis not present

## 2020-03-30 DIAGNOSIS — M5489 Other dorsalgia: Secondary | ICD-10-CM | POA: Diagnosis not present

## 2020-03-30 DIAGNOSIS — M438X4 Other specified deforming dorsopathies, thoracic region: Secondary | ICD-10-CM | POA: Diagnosis not present

## 2020-03-30 DIAGNOSIS — Z9889 Other specified postprocedural states: Secondary | ICD-10-CM | POA: Diagnosis not present

## 2020-03-30 DIAGNOSIS — I700 Atherosclerosis of aorta: Secondary | ICD-10-CM | POA: Diagnosis not present

## 2020-03-30 DIAGNOSIS — S32048D Other fracture of fourth lumbar vertebra, subsequent encounter for fracture with routine healing: Secondary | ICD-10-CM | POA: Diagnosis not present

## 2020-03-30 DIAGNOSIS — G8929 Other chronic pain: Secondary | ICD-10-CM | POA: Diagnosis not present

## 2020-03-30 DIAGNOSIS — S22088D Other fracture of T11-T12 vertebra, subsequent encounter for fracture with routine healing: Secondary | ICD-10-CM | POA: Diagnosis not present

## 2020-03-30 DIAGNOSIS — M546 Pain in thoracic spine: Secondary | ICD-10-CM | POA: Diagnosis not present

## 2020-03-30 DIAGNOSIS — M8588 Other specified disorders of bone density and structure, other site: Secondary | ICD-10-CM | POA: Diagnosis not present

## 2020-03-31 ENCOUNTER — Other Ambulatory Visit: Payer: Self-pay | Admitting: Cardiology

## 2020-04-04 DIAGNOSIS — Z7189 Other specified counseling: Secondary | ICD-10-CM | POA: Insufficient documentation

## 2020-04-04 NOTE — Progress Notes (Signed)
Cardiology Office Note   Date:  04/05/2020   ID:  Van, Akopyan 1936/10/27, MRN [id number]  PCP:  Janora Norlander, DO  Cardiologist:   Minus Breeding, MD   Chief Complaint  Patient presents with  . Atrial Fibrillation      History of Present Illness: [name] is a 84 y.o. female who presents for follow up of CAD.  She has PAF as well.  She wore a cardiac event monitor last year and had no significant arrhythmias.  She had another difficult year.  She had a cystic structure in her spine.  She had a spinal cord stimulator removed in June.  She had a subarachnoid cyst drainage and a shunt placed.  This was in July.  She has now been diagnosed with lymphoma.  She is being evaluated for the therapy that she might have but she is not inclined to accept chemotherapy.  She has been limited by back pain and walks with a Rollator.  She has not had any chest pressure, neck or arm discomfort.  She has not had any pain between her shoulder blades it was her previous angina.  She does not notice any palpitations but does not think she has been out of rhythm.  She gets lightheaded but she does not have necessarily presyncope or syncope.  She does not have any new shortness of breath, PND or orthopnea.  Has had no weight gain or edema.  She is actually lost about 20 pounds since her surgeries.  She has chronic back pain as you might expect.  Past Medical History:  Diagnosis Date  . Anxiety   . Arthritis   . Asthmatic bronchitis   . Atrial fibrillation ([place])   . Bowel obstruction (H[facility])    blockage  . CAD (coronary artery disease)    Stent to RI 2003.  Myoview 2013 no ischemia.  . Cataract   . Chronic back pain   . Chronic bronchitis ([place])   . Colon polyp    adenomatous  . Congestive heart disease (H[facility])    Preserved EF  . Depression   . Esophageal motility disorder   . Fibromyalgia   . GERD (gastroesophageal reflux disease)   . History of hiatal hernia   . History of kidney  stones   . Hyperlipidemia   . Hypertension   . Hypothyroid   . 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 Nephrolithiasis   . OA (osteoarthritis)   . Status post dilation of esophageal narrowing     Past Surgical History:  Procedure Laterality Date  . ABDOMINAL HYSTERECTOMY     partial  . ANKLE FUSION  08/27/2012   Procedure: ARTHRODESIS ANKLE;  Surgeon: Wylene Simmer, MD;  Location: [place];  Service: Orthopedics;  Laterality: Right;  Arthrodesis right ankle and subtalar joint  . APPENDECTOMY    . ARTHRODESIS TIBIOFIBULAR Right 03/31/2014   Procedure: REVISION OF SUBTALOR ARTHRODESIS  RIGHT ;  Surgeon: Wylene Simmer, MD;  Location: [place];  Service: Orthopedics;  Laterality: Right;  . BACK SURGERY    . BREAST SURGERY     Left breast lump removed  . BRONCHOSCOPY    . CORONARY ANGIOPLASTY WITH STENT PLACEMENT  2003  . DECORTICATION Right 05/03/2014   Procedure: DECORTICATION;  Surgeon: [name], MD;  Location: [place];  Service: Thoracic;  Laterality: Right;  . EYE SURGERY  2016   cateracts  . FOOT ARTHRODESIS, SUBTALAR Right  2013  . HARDWARE REMOVAL Right 03/31/2014   Procedure: REMOVAL OF DEEP IMPLANTS X 3  RIGHT ;  Surgeon: Wylene Simmer, MD;  Location: [place];  Service: Orthopedics;  Laterality: Right;  . HARDWARE REMOVAL Right 11/03/2014   Procedure: HARDWARE REMOVAL OF SUBTALAR JOINT;  Surgeon: Wylene Simmer, MD;  Location: [place];  Service: Orthopedics;  Laterality: Right;  . HARDWARE REVISION  03/31/2014   SUBTALOR  ARTHRODESIS       DR HEWITT  . JOINT REPLACEMENT     Right knee  . KNEE ARTHROSCOPY  03/26/2012   Procedure: ARTHROSCOPY KNEE;  Surgeon: Wylene Simmer, MD;  Location: [place];  Service: Orthopedics;  Laterality: Left;  with Debridement of Lateral Meniscus tear  . REMOVAL OF IMPLANT Right 03/31/2014   DR HEWITT  . ROTATOR CUFF REPAIR Bilateral   . SINUS SURGERY WITH INSTATRAK    . SPINAL CORD STIMULATOR INSERTION N/A 07/02/2017    Procedure: LUMBAR SPINAL CORD STIMULATOR INSERTION;  Surgeon: Melina Schools, MD;  Location: [place];  Service: Orthopedics;  Laterality: N/A;  120 mins  . TOTAL KNEE ARTHROPLASTY Right   . VIDEO ASSISTED THORACOSCOPY (VATS)/EMPYEMA Right 05/03/2014   Procedure: VIDEO ASSISTED THORACOSCOPY (VATS)/EMPYEMA;  Surgeon: [name], MD;  Location: [place];  Service: Thoracic;  Laterality: Right;  Marland Kitchen VIDEO BRONCHOSCOPY N/A 05/03/2014   Procedure: VIDEO BRONCHOSCOPY;  Surgeon: [name], MD;  Location: [facility];  Service: Thoracic;  Laterality: N/A;     Current Outpatient Medications  Medication Sig Dispense Refill  . albuterol (VENTOLIN HFA) 108 (90 Base) MCG/ACT inhaler Inhale 2 puffs into the lungs every 4 (four) hours as needed for wheezing or shortness of breath. 6.7 g 0  . ALPRAZolam (XANAX) 1 MG tablet Take 0.5-1 tablets (0.5-1 mg total) by mouth at bedtime as needed for anxiety. May use 1/2 tablet during the day IF needed for panic attack. 45 tablet 2  . Ascorbic Acid (VITAMIN C) 1000 MG tablet Take 1,000 mg by mouth daily.    . beta carotene 25000 UNIT capsule Take 25,000 Units by mouth daily.    . budesonide (PULMICORT) 0.5 MG/2ML nebulizer solution Take 2 mLs (0.5 mg total) by nebulization 2 (two) times daily. 120 mL 1  . Coenzyme Q10 10 MG capsule Take 10 mg by mouth daily.    . cyclobenzaprine (FLEXERIL) 10 MG tablet Take 10 mg by mouth 3 (three) times daily as needed.    . dicyclomine (BENTYL) 10 MG capsule Take 1 capsule (10 mg total) by mouth 4 (four) times daily as needed for spasms. 30 capsule 0  . diltiazem (CARDIZEM CD) 120 MG 24 hr capsule Take 120 mg by mouth daily.    . fluticasone (FLONASE) 50 MCG/ACT nasal spray Place 2 sprays into both nostrils daily.    Marland Kitchen levothyroxine (SYNTHROID) 25 MCG tablet Take 2 tablets (50 mcg total) by mouth daily before breakfast. 60 tablet 5  . linaclotide (LINZESS) 145 MCG CAPS capsule Take 1 capsule (145 mcg total) by mouth daily. To regulate  bowel movements 30 capsule 5  . loratadine (CLARITIN) 10 MG tablet Take 1 tablet (10 mg total) by mouth daily. 90 tablet 0  . meclizine (ANTIVERT) 25 MG tablet Take 1 tablet (25 mg total) by mouth 3 (three) times daily as needed for dizziness. 30 tablet 0  . metoprolol tartrate (LOPRESSOR) 25 MG tablet TAKE 2 TABLETS IN THE MORNING, AND 1 TABLET IN THE EVENING 90 tablet 0  . montelukast (  SINGULAIR) 10 MG tablet Take 1 tablet (10 mg total) by mouth daily. 90 tablet 0  . Multiple Vitamin (MULTIVITAMIN) tablet Take 1 tablet by mouth daily.    . nitroGLYCERIN (NITROSTAT) 0.4 MG SL tablet Place 1 tablet (0.4 mg total) under the tongue every 5 (five) minutes as needed for chest pain. 30 tablet 0  . omega-3 acid ethyl esters (LOVAZA) 1 g capsule Take 2 capsules (2 g total) by mouth 2 (two) times daily. 120 capsule 0  . omeprazole-sodium bicarbonate (ZEGERID) 40-1100 MG capsule TAKE (1) CAPSULE DAILY 30 capsule 5  . ondansetron (ZOFRAN) 4 MG tablet Take 1 tablet (4 mg total) by mouth every 6 (six) hours as needed for nausea or vomiting. 20 tablet 0  . polyethylene glycol (MIRALAX / GLYCOLAX) 17 g packet Take 17 g by mouth daily.    . rosuvastatin (CRESTOR) 20 MG tablet Take 1 tablet (20 mg total) by mouth daily. 90 tablet 0  . sennosides-docusate sodium (SENOKOT-S) 8.6-50 MG tablet Take 1 tablet by mouth at bedtime.    . sertraline (ZOLOFT) 100 MG tablet Take 1 tablet (100 mg total) by mouth daily. 90 tablet 3  . XARELTO 20 MG TABS tablet Take 1 tablet (20 mg total) by mouth daily. 90 tablet 0  . denosumab (PROLIA) 60 MG/ML SOSY injection Inject 60 mg every 6 (six) months into the skin. Administer in upper arm, thigh, or abdomen 1 mL 0  . tamsulosin (FLOMAX) 0.4 MG CAPS capsule TAKE (1) CAPSULE DAILY (Patient not taking: Reported on 04/05/2020) 90 capsule 0   No current facility-administered medications for this visit.    Allergies:   Naproxen, Penicillins, Sulfonamide derivatives, Aspirin, Captopril,  Strawberry extract, and Clindamycin/lincomycin    ROS:  Please see the history of present illness.   Otherwise, review of systems are positive for none.   All other systems are reviewed and negative.    PHYSICAL EXAM: VS:  BP 120/78   Pulse 62   Ht 5\' 1"  (1.549 m)   Wt 127 lb (57.6 kg)   BMI 24.00 kg/m  , BMI Body mass index is 24 kg/m. GENERAL: Frail appearing NECK:  No jugular venous distention, waveform within normal limits, carotid upstroke brisk and symmetric, no bruits, no thyromegaly LUNGS:  Clear to auscultation bilaterally CHEST:  Unremarkable HEART:  PMI not displaced or sustained,S1 and S2 within normal limits, no S3, no S4, no clicks, no rubs, no murmurs ABD:  Flat, positive bowel sounds normal in frequency in pitch, no bruits, no rebound, no guarding, no midline pulsatile mass, no hepatomegaly, no splenomegaly EXT:  2 plus pulses throughout, no edema, no cyanosis no clubbing    EKG:  EKG is ordered today. The ekg ordered today demonstrates sinus rhythm, rate 62, axis within normal limits, intervals within normal limits, no acute ST-T wave changes.   Recent Labs: 03/08/2020: ALT 9; BUN 9; Creatinine, Ser 0.66; Hemoglobin 11.5; Platelets 264; Potassium 4.4; Sodium 132; TSH 1.630    Lipid Panel    Component Value Date/Time   CHOL 115 03/08/2020 1429   CHOL 124 08/11/2013 0947   TRIG 122 03/08/2020 1429   TRIG 127 03/07/2016 1142   TRIG 132 08/11/2013 0947   HDL 40 03/08/2020 1429   HDL 66 03/07/2016 1142   HDL 54 08/11/2013 0947   CHOLHDL 2.9 03/08/2020 1429   CHOLHDL 3.1 05/04/2009 0424   VLDL 20 05/04/2009 0424   LDLCALC 53 03/08/2020 1429   LDLCALC 72 09/20/2014 1200  LDLCALC 44 08/11/2013 0947      Wt Readings from Last 3 Encounters:  04/05/20 127 lb (57.6 kg)  09/09/19 143 lb (64.9 kg)  07/23/19 142 lb 9.6 oz (64.7 kg)      Other studies Reviewed: Additional studies/ records that were reviewed today include: Extensive review of [facility]  records. Review of the above records demonstrates:  Please see elsewhere in the note.     ASSESSMENT AND PLAN:  CAD:  Despite her complicated year she has had no chest pain suggestive of unstable angina.  No further cardiovascular testing is suggested.  She can continue with risk reduction.   ATRIAL FIBRILLATION:   She has not had any symptomatic paroxysms.  She tolerates anticoagulation.  Ms.Loreal M Sharpehas a CHA2DS2 - VASc score of 4 with a risk of stroke of 4%.  She tolerates anticoagulation.   HTN:   The blood pressure is at target.  No change in therapy.  DIASTOLIC HF:      She seems to be euvolemic.  No change in therapy.  SNORING:    She did not have sleep apnea.  No further evaluation.  BACK PAIN: I have been trying to set her up with pain management.  COVID-19 Education:   She did get the vaccine.   Current medicines are reviewed at length with the patient today.  The patient does not have concerns regarding medicines.  The following changes have been made:  no change  Labs/ tests ordered today include: None  Orders Placed This Encounter  Procedures  . EKG 12-Lead     Disposition:   FU with me in 12 months.     Signed, Minus Breeding, MD  04/05/2020 3:06 PM    [facility]

## 2020-04-05 ENCOUNTER — Ambulatory Visit (INDEPENDENT_AMBULATORY_CARE_PROVIDER_SITE_OTHER): Payer: Medicare Other | Admitting: Cardiology

## 2020-04-05 ENCOUNTER — Encounter: Payer: Self-pay | Admitting: Cardiology

## 2020-04-05 ENCOUNTER — Other Ambulatory Visit: Payer: Self-pay

## 2020-04-05 VITALS — BP 120/78 | HR 62 | Ht 61.0 in | Wt 127.0 lb

## 2020-04-05 DIAGNOSIS — I10 Essential (primary) hypertension: Secondary | ICD-10-CM | POA: Diagnosis not present

## 2020-04-05 DIAGNOSIS — R0683 Snoring: Secondary | ICD-10-CM | POA: Diagnosis not present

## 2020-04-05 DIAGNOSIS — I480 Paroxysmal atrial fibrillation: Secondary | ICD-10-CM | POA: Diagnosis not present

## 2020-04-05 DIAGNOSIS — I2510 Atherosclerotic heart disease of native coronary artery without angina pectoris: Secondary | ICD-10-CM | POA: Diagnosis not present

## 2020-04-05 DIAGNOSIS — I5032 Chronic diastolic (congestive) heart failure: Secondary | ICD-10-CM | POA: Diagnosis not present

## 2020-04-05 DIAGNOSIS — Z7189 Other specified counseling: Secondary | ICD-10-CM

## 2020-04-05 NOTE — Patient Instructions (Signed)
Medication Instructions:  The current medical regimen is effective;  continue present plan and medications.  *If you need a refill on your cardiac medications before your next appointment, please call your pharmacy*  Follow-Up: At [facility], you and your health needs are our priority.  As part of our continuing mission to provide you with exceptional heart care, we have created designated Provider Care Teams.  These Care Teams include your primary Cardiologist (physician) and Advanced Practice Providers (APPs -  Physician Assistants and Nurse Practitioners) who all work together to provide you with the care you need, when you need it.  We recommend signing up for the patient portal called "MyChart".  Sign up information is provided on this After Visit Summary.  MyChart is used to connect with patients for Virtual Visits (Telemedicine).  Patients are able to view lab/test results, encounter notes, upcoming appointments, etc.  Non-urgent messages can be sent to your provider as well.   To learn more about what you can do with MyChart, go to [email or web address].    Your next appointment:   12 month(s)  The format for your next appointment:   In Person  Provider:   James Hochrein, MD   Thank you for choosing [facility]!!     

## 2020-04-06 ENCOUNTER — Telehealth: Payer: Self-pay | Admitting: Family Medicine

## 2020-04-06 ENCOUNTER — Other Ambulatory Visit: Payer: Self-pay | Admitting: Family Medicine

## 2020-04-06 NOTE — Telephone Encounter (Signed)
Patient aware will have to wait till Dr. Lajuana Ripple comes back in

## 2020-04-06 NOTE — Telephone Encounter (Signed)
Pt wants to increase her quantity per month of Xanax. States that she is going through a lot with her lymphoma diagnosis and having balance issues. She is schedule to have a brain scan at [facility] soon. Patient takes 1.5 tabs of Xanax nightly. States that a good night sleep is very important to her.  She also request medication for her back back. Mentioned tramadol. Patient has an appt with an orthopedic physician next week she states. Instructed her that they would be able to manage that medication for her and that I would seen Dr. Lajuana Ripple a message about the Xanax. However, at her last visit it was discussed to try and limit use. Pt does not want to limit it at all. She wants to continue as she has been taking.

## 2020-04-06 NOTE — Telephone Encounter (Signed)
Please let patient know Dr. Lajuana Ripple is off today but that she will need to be the one to address this, not me as the covering provider.

## 2020-04-07 NOTE — Telephone Encounter (Signed)
Aware of provider's advice. 

## 2020-04-07 NOTE — Telephone Encounter (Signed)
Given frequent falls, I DO NOT recommend dose increase.  Please reinforce that patient should ONLY take as directed, otherwise, she is violating her contract and this medication will be discontinued.

## 2020-04-11 DIAGNOSIS — R413 Other amnesia: Secondary | ICD-10-CM | POA: Diagnosis not present

## 2020-04-11 DIAGNOSIS — R590 Localized enlarged lymph nodes: Secondary | ICD-10-CM | POA: Diagnosis not present

## 2020-04-13 ENCOUNTER — Ambulatory Visit: Payer: Medicare Other | Admitting: Physical Therapy

## 2020-04-17 ENCOUNTER — Other Ambulatory Visit: Payer: Self-pay | Admitting: Family Medicine

## 2020-04-17 ENCOUNTER — Other Ambulatory Visit: Payer: Self-pay | Admitting: Cardiology

## 2020-04-20 ENCOUNTER — Other Ambulatory Visit: Payer: Self-pay | Admitting: *Deleted

## 2020-04-20 MED ORDER — DILTIAZEM HCL ER COATED BEADS 120 MG PO CP24: 120.0000 mg | ORAL_CAPSULE | Freq: Every day | ORAL | 0 refills | Status: DC

## 2020-04-25 ENCOUNTER — Other Ambulatory Visit: Payer: Self-pay

## 2020-04-25 ENCOUNTER — Ambulatory Visit: Payer: Medicare Other | Attending: Neurosurgery | Admitting: Physical Therapy

## 2020-04-25 ENCOUNTER — Encounter: Payer: Self-pay | Admitting: Physical Therapy

## 2020-04-25 DIAGNOSIS — G8929 Other chronic pain: Secondary | ICD-10-CM | POA: Diagnosis not present

## 2020-04-25 DIAGNOSIS — M545 Low back pain: Secondary | ICD-10-CM | POA: Insufficient documentation

## 2020-04-25 DIAGNOSIS — M6281 Muscle weakness (generalized): Secondary | ICD-10-CM | POA: Diagnosis not present

## 2020-04-25 DIAGNOSIS — R262 Difficulty in walking, not elsewhere classified: Secondary | ICD-10-CM | POA: Insufficient documentation

## 2020-04-25 DIAGNOSIS — M546 Pain in thoracic spine: Secondary | ICD-10-CM | POA: Diagnosis not present

## 2020-04-25 NOTE — Therapy (Addendum)
[facility] [place], [place], [address] Phone: [phone]   Fax:  [phone]  Physical Therapy Evaluation  Patient Details  Name: [name] MRN: [id number] Date of Birth: December 18, 1935 Referring Provider (PT): Ashley Murrain, [place]   Encounter Date: 04/25/2020  PT End of Session - 04/25/20 1422    Visit Number  1    Number of Visits  12    Date for PT Re-Evaluation  06/13/20    Authorization Type  Progress note every 10th visit; KX modifier at 15th visit    PT Start Time  1115    PT Stop Time  1202    PT Time Calculation (min)  47 min    Activity Tolerance  Patient limited by pain    Behavior During Therapy  [facility] for tasks assessed/performed       Past Medical History:  Diagnosis Date  . Anxiety   . Arthritis   . Asthmatic bronchitis   . Atrial fibrillation ([place])   . Bowel obstruction (HCC)    blockage  . CAD (coronary artery disease)    Stent to RI 2003.  Myoview 2013 no ischemia.  . Cataract   . Chronic back pain   . Chronic bronchitis ([place])   . Colon polyp    adenomatous  . Congestive heart disease (HCC)    Preserved EF  . Depression   . Esophageal motility disorder   . Fibromyalgia   . GERD (gastroesophageal reflux disease)   . History of hiatal hernia   . History of kidney stones   . Hyperlipidemia   . Hypertension   . Hypothyroid   . 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 Nephrolithiasis   . OA (osteoarthritis)   . Status post dilation of esophageal narrowing     Past Surgical History:  Procedure Laterality Date  . ABDOMINAL HYSTERECTOMY     partial  . ANKLE FUSION  08/27/2012   Procedure: ARTHRODESIS ANKLE;  Surgeon: Wylene Simmer, MD;  Location: [place];  Service: Orthopedics;  Laterality: Right;  Arthrodesis right ankle and subtalar joint  . APPENDECTOMY    . ARTHRODESIS TIBIOFIBULAR Right 03/31/2014   Procedure: REVISION OF SUBTALOR ARTHRODESIS  RIGHT ;   Surgeon: Wylene Simmer, MD;  Location: [place];  Service: Orthopedics;  Laterality: Right;  . BACK SURGERY    . BREAST SURGERY     Left breast lump removed  . BRONCHOSCOPY    . CORONARY ANGIOPLASTY WITH STENT PLACEMENT  2003  . DECORTICATION Right 05/03/2014   Procedure: DECORTICATION;  Surgeon: [name], MD;  Location: [place];  Service: Thoracic;  Laterality: Right;  . EYE SURGERY  2016   cateracts  . FOOT ARTHRODESIS, SUBTALAR Right 2013  . HARDWARE REMOVAL Right 03/31/2014   Procedure: REMOVAL OF DEEP IMPLANTS X 3  RIGHT ;  Surgeon: Wylene Simmer, MD;  Location: [place];  Service: Orthopedics;  Laterality: Right;  . HARDWARE REMOVAL Right 11/03/2014   Procedure: HARDWARE REMOVAL OF SUBTALAR JOINT;  Surgeon: Wylene Simmer, MD;  Location: [place];  Service: Orthopedics;  Laterality: Right;  . HARDWARE REVISION  03/31/2014   SUBTALOR  ARTHRODESIS       DR HEWITT  . JOINT REPLACEMENT     Right knee  . KNEE ARTHROSCOPY  03/26/2012   Procedure: ARTHROSCOPY KNEE;  Surgeon: Wylene Simmer, MD;  Location: [place];  Service: Orthopedics;  Laterality: Left;  with Debridement of  Lateral Meniscus tear  . REMOVAL OF IMPLANT Right 03/31/2014   DR HEWITT  . ROTATOR CUFF REPAIR Bilateral   . SINUS SURGERY WITH INSTATRAK    . SPINAL CORD STIMULATOR INSERTION N/A 07/02/2017   Procedure: LUMBAR SPINAL CORD STIMULATOR INSERTION;  Surgeon: Melina Schools, MD;  Location: [place];  Service: Orthopedics;  Laterality: N/A;  120 mins  . TOTAL KNEE ARTHROPLASTY Right   . VIDEO ASSISTED THORACOSCOPY (VATS)/EMPYEMA Right 05/03/2014   Procedure: VIDEO ASSISTED THORACOSCOPY (VATS)/EMPYEMA;  Surgeon: [name], MD;  Location: [place];  Service: Thoracic;  Laterality: Right;  Marland Kitchen VIDEO BRONCHOSCOPY N/A 05/03/2014   Procedure: VIDEO BRONCHOSCOPY;  Surgeon: [name], MD;  Location: [facility] OR;  Service: Thoracic;  Laterality: N/A;    There were no vitals filed for this visit.   Subjective Assessment -  04/25/20 1408    Subjective  COVID-19 screening performed upon arrival. Patient arrives to physical therapy with reports of low back pain and thoracic pain that has exacerbated over the past year since T7-T9 laminectomy with placement of cysto-pleural shunt in 2020. Patient has difficulties with ADLs and home activities secondary to back pain. Patient lives alone but gains assistance from her daughter in law for household chores. Patient requires increased time for sit to stand transfers secondary to low back pain. Patient only able to tolerate 10 mins of standing and walking before her back increases with pain. Patient reports pain at worst as 9/10 and pain at best as 0/10 with rest, ice, and laying in her bed or recliner. Patient's goals are to decrease pain, improve movement, improve standing and walking tolerance, and have less difficulties with home activities and ADLs.    Pertinent History  Non-Hodgkins Lymphoma, HTN, CHF, AFib, Osteoporosis, GERD, T7-T9 Laminectomy 2020, R TKA 2008, history of compression fracture at T11    Limitations  Lifting;Standing;House hold activities;Walking    How long can you stand comfortably?  10 mins    How long can you walk comfortably?  10 mins    Diagnostic tests  X-ray: Unchanged appearance of chronic T11 superior endplate compression fracture deformity, multilevel disc degenerative changes    Patient Stated Goals  decrease pain, improve balance and walking    Currently in Pain?  Yes    Pain Score  9     Pain Location  Back    Pain Orientation  Lower;Mid    Pain Descriptors / Indicators  Aching;Sore;Throbbing    Pain Type  Chronic pain    Pain Onset  More than a month ago    Pain Frequency  Constant    Aggravating Factors   "being up on my feet"    Pain Relieving Factors  "Laying down in recliner or bed, ice, muscle relaxer, tylenol"    Effect of Pain on Daily Activities  housework and driving         [facility] PT Assessment - 04/25/20 0001       Assessment   Medical Diagnosis  Lumbar back pain, chronic midline thoracic pain, history of thoracic surgery    Referring Provider (PT)  Ashley Murrain, PA-C    Onset Date/Surgical Date  --   Ongoing 2020   Next MD Visit  N/A    Prior Therapy  no      Precautions   Precautions  Other (comment);Fall    Precaution Comments  Active cancer; no e-stim or ultrasound      Restrictions   Weight Bearing Restrictions  No  Balance Screen   Has the patient fallen in the past 6 months  Yes    How many times?  4 or 5    Has the patient had a decrease in activity level because of a fear of falling?   Yes    Is the patient reluctant to leave their home because of a fear of falling?   Yes      [place] residence    Living Arrangements  Alone      Prior Function   Level of Independence  Independent with basic ADLs;Needs assistance with homemaking      Observation/Other Assessments   Cranial Nerve(s)  f      ROM / Strength   AROM / PROM / Strength  Strength      Strength   Overall Strength Comments  performed in sitting    Strength Assessment Site  Hip;Knee    Right/Left Hip  Right;Left    Right Hip Flexion  3-/5    Right Hip ABduction  3/5    Right Hip ADduction  3/5    Left Hip Flexion  3-/5    Left Hip ABduction  3/5    Left Hip ADduction  3/5    Right/Left Knee  Right;Left    Right Knee Flexion  3+/5    Right Knee Extension  3+/5    Left Knee Flexion  3+/5    Left Knee Extension  3+/5      Palpation   Palpation comment  tenderness to left QL and thoracolumbar paraspinals      Transfers   Comments  hand held assist to transfer from sit to stand      Ambulation/Gait   Assistive device  Rollator    Gait Pattern  Step-to pattern;Decreased step length - right;Decreased stride length;Decreased stance time - right;Decreased hip/knee flexion - right;Trunk flexed;Narrow base of support    Gait velocity  slow cautious gait with rollator                 Objective measurements completed on examination: See above findings.              PT Education - 04/25/20 1421    Education Details  draw ins (in sitting), sitting marching, sitting hip abd, scapular retractions    Person(s) Educated  Patient    Methods  Explanation;Demonstration;Handout    Comprehension  Verbalized understanding;Returned demonstration          PT Long Term Goals - 04/25/20 1714      PT LONG TERM GOAL #1   Title  Patient will be independent with HEP    Time  6    Period  Weeks    Status  New      PT LONG TERM GOAL #2   Title  Patient wil demonstrate 4/5 or greater bilateral LE MMT to improve stability during functional tasks.    Time  6    Period  Weeks    Status  New      PT LONG TERM GOAL #3   Title  Patient will report ability to stand and  walk with walker for 15 mins or greater to improve ability to perform functional tasks.    Time  6    Period  Weeks    Status  New             Plan - 04/25/20 1423    Clinical Impression Statement  Patient  is an 84 year old female who presents to physical therapy with ongoing thoracic and lumbar pain and decreased bilateral LE MMT. Patient ambulates with a rollator with decreased step length, decreased right stance time, narrow base of support, and trunk flexed. Patient unable to tolerate laying in supine secondary to pain. Patient tender to palpation to thoracic and lumbar paraspinals and throughout spinous processes. Patient and PT discussed plan of care and discussed HEP to which she reported understanding. Patient would benefit from skilled physical therapy to address deficits and goals.    Personal Factors and Comorbidities  Comorbidity 3+;Time since onset of injury/illness/exacerbation    Comorbidities  Non-Hodgkins Lymphoma, HTN, CHF, AFib, Osteoporosis, GERD, T7-T9 Laminectomy 2020, R TKA 2008, history of compression fracture at T11    Examination-Activity Limitations   Locomotion Level;Transfers;Stand    Examination-Participation Restrictions  Driving    Stability/Clinical Decision Making  Evolving/Moderate complexity    Clinical Decision Making  Moderate    Rehab Potential  Good    PT Frequency  2x / week    PT Duration  6 weeks    PT Treatment/Interventions  ADLs/Self Care Home Management;Gait training;Stair training;Functional mobility training;Therapeutic activities;Therapeutic exercise;Balance training;Neuromuscular re-education;Manual techniques;Passive range of motion;Patient/family education;Moist Heat;Cryotherapy    PT Next Visit Plan  nustep if tolerable, core stabilization, (can try supine but may not be able to tolerate) LE strengthening, balance activities. No ultrasound or e-stim secondary to cancer    PT Home Exercise Plan  see patient education section    Consulted and Agree with Plan of Care  Patient       Patient will benefit from skilled therapeutic intervention in order to improve the following deficits and impairments:  Abnormal gait, Decreased activity tolerance, Decreased balance, Decreased strength, Difficulty walking, Pain  Visit Diagnosis: Chronic bilateral low back pain, unspecified whether sciatica present  Pain in thoracic spine  Difficulty in walking, not elsewhere classified  Muscle weakness (generalized)     Problem List Patient Active Problem List   Diagnosis Date Noted  . Educated about COVID-19 virus infection 04/04/2020  . Low grade malignant lymphoma ([place]) 03/08/2020  . Chronic diastolic (congestive) heart failure ([place]) 07/23/2019  . Chronic constipation 07/23/2019  . Urine retention 07/23/2019  . Arachnoid cyst of spine 07/23/2019  . Chronic hyponatremia 07/23/2019  . Palpitations 04/02/2019  . Snoring 02/19/2018  . Other fatigue 02/19/2018  . Chronic pain syndrome 01/20/2018  . Chronic low back pain 01/20/2018  . Chronic pain 07/02/2017  . Vocal cord dysfunction 02/04/2017  . Irritable bowel  syndrome with diarrhea 12/11/2015  . UTI (urinary tract infection) 08/22/2015  . Cough 07/26/2015  . Diarrhea 11/30/2014  . Heme + stool 11/30/2014  . Chronic anticoagulation 11/30/2014  . Hypertensive heart disease with chronic diastolic congestive heart failure ([place]) 04/30/2014  . 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 Osteoporosis 01/27/2014  . Fibromyalgia 08/09/2013  . COPD with asthma ([place]) 08/01/2010  . Acquired hypothyroidism 03/20/2010  . Major depression, chronic 03/20/2010  . ESOPHAGEAL MOTILITY DISORDER 03/20/2010  . Hyperlipidemia 04/22/2009  . Coronary atherosclerosis 04/22/2009  . ATRIAL FIBRILLATION, PAROXYSMAL 04/22/2009  . ESOPHAGEAL STRICTURE 10/31/2004  . GERD 10/31/2004  . HIATAL HERNIA 10/06/2001    Gabriela Eves, PT, DPT 04/25/2020, 5:34 PM  [facility] [place], [place], [address] Phone: [phone]   Fax:  [phone]  Name: [name] MRN: [id number] Date of Birth: 1936/02/19

## 2020-05-01 ENCOUNTER — Other Ambulatory Visit: Payer: Self-pay | Admitting: Cardiology

## 2020-05-03 ENCOUNTER — Ambulatory Visit: Payer: Medicare Other | Admitting: Physical Therapy

## 2020-05-11 ENCOUNTER — Ambulatory Visit: Payer: Medicare Other | Admitting: Physical Therapy

## 2020-05-16 DIAGNOSIS — Z87891 Personal history of nicotine dependence: Secondary | ICD-10-CM | POA: Diagnosis not present

## 2020-05-16 DIAGNOSIS — M549 Dorsalgia, unspecified: Secondary | ICD-10-CM | POA: Diagnosis not present

## 2020-05-16 DIAGNOSIS — R590 Localized enlarged lymph nodes: Secondary | ICD-10-CM | POA: Diagnosis not present

## 2020-05-16 DIAGNOSIS — G8929 Other chronic pain: Secondary | ICD-10-CM | POA: Diagnosis not present

## 2020-05-16 DIAGNOSIS — Z08 Encounter for follow-up examination after completed treatment for malignant neoplasm: Secondary | ICD-10-CM | POA: Diagnosis not present

## 2020-05-16 DIAGNOSIS — C8590 Non-Hodgkin lymphoma, unspecified, unspecified site: Secondary | ICD-10-CM | POA: Diagnosis not present

## 2020-05-16 DIAGNOSIS — Z9181 History of falling: Secondary | ICD-10-CM | POA: Diagnosis not present

## 2020-05-18 DIAGNOSIS — J45909 Unspecified asthma, uncomplicated: Secondary | ICD-10-CM | POA: Diagnosis not present

## 2020-06-12 DIAGNOSIS — J45909 Unspecified asthma, uncomplicated: Secondary | ICD-10-CM | POA: Diagnosis not present

## 2020-06-15 DIAGNOSIS — Z9889 Other specified postprocedural states: Secondary | ICD-10-CM | POA: Diagnosis not present

## 2020-06-15 DIAGNOSIS — R531 Weakness: Secondary | ICD-10-CM | POA: Diagnosis not present

## 2020-06-15 DIAGNOSIS — M4316 Spondylolisthesis, lumbar region: Secondary | ICD-10-CM | POA: Diagnosis not present

## 2020-06-15 DIAGNOSIS — M545 Low back pain: Secondary | ICD-10-CM | POA: Diagnosis not present

## 2020-06-15 DIAGNOSIS — R296 Repeated falls: Secondary | ICD-10-CM | POA: Diagnosis not present

## 2020-06-15 DIAGNOSIS — G9689 Other specified disorders of central nervous system: Secondary | ICD-10-CM | POA: Diagnosis not present

## 2020-06-30 ENCOUNTER — Other Ambulatory Visit: Payer: Self-pay | Admitting: Family Medicine

## 2020-06-30 DIAGNOSIS — F411 Generalized anxiety disorder: Secondary | ICD-10-CM

## 2020-07-03 ENCOUNTER — Telehealth: Payer: Self-pay | Admitting: *Deleted

## 2020-07-03 NOTE — Telephone Encounter (Signed)
Vm from [facility] w/ [facility] to get patient PT/OT order Pt has had frequent fall, has been in the bed in the last week  Has an appt next Tuesday, this looks to be for a prolia injection maybe, last visit was in March

## 2020-07-04 NOTE — Telephone Encounter (Signed)
Called pt to offer appt with Dr. Livia Snellen tomorrow, she will call friends to see if she can get someone to bring her

## 2020-07-04 NOTE — Telephone Encounter (Signed)
Requires F2F, Otila Kluver w/ [facility] does not feel this can wait till Tuesday's appt. I am not seeing anything before then

## 2020-07-04 NOTE — Telephone Encounter (Signed)
Can she get in with another provider?

## 2020-07-04 NOTE — Telephone Encounter (Signed)
Is this something I can order without seeing her?  I think our last OV was in March.

## 2020-07-05 NOTE — Telephone Encounter (Signed)
Appt was made for Friday 07/07/20

## 2020-07-07 ENCOUNTER — Other Ambulatory Visit: Payer: Self-pay

## 2020-07-07 ENCOUNTER — Ambulatory Visit (INDEPENDENT_AMBULATORY_CARE_PROVIDER_SITE_OTHER): Payer: Medicare Other | Admitting: Family

## 2020-07-07 ENCOUNTER — Encounter: Payer: Self-pay | Admitting: Family

## 2020-07-07 VITALS — BP 119/77 | HR 71 | Temp 96.5°F | Ht 60.0 in

## 2020-07-07 DIAGNOSIS — R296 Repeated falls: Secondary | ICD-10-CM | POA: Diagnosis not present

## 2020-07-07 DIAGNOSIS — G8929 Other chronic pain: Secondary | ICD-10-CM | POA: Diagnosis not present

## 2020-07-07 DIAGNOSIS — R531 Weakness: Secondary | ICD-10-CM | POA: Diagnosis not present

## 2020-07-07 DIAGNOSIS — M545 Low back pain, unspecified: Secondary | ICD-10-CM

## 2020-07-07 NOTE — Patient Instructions (Signed)
Weakness Weakness is a lack of strength. You may feel weak all over your body (generalized), or you may feel weak in one specific part of your body (focal). Common causes of weakness include:  Infection and immune system disorders.  Physical exhaustion.  Internal bleeding or other blood loss that results in a lack of red blood cells (anemia).  Dehydration.  An imbalance in mineral (electrolyte) levels, such as potassium.  Heart disease, circulation problems, or stroke. Other causes include:  Some medicines or cancer treatment.  Stress, anxiety, or depression.  Nervous system disorders.  Thyroid disorders.  Loss of muscle strength because of age or inactivity.  Poor sleep quality or sleep disorders. The cause of your weakness may not be known. Some causes of weakness can be serious, so it is important to see your health care provider. Follow these instructions at home: Activity  Rest as needed.  Try to get enough sleep. Most adults need 7-8 hours of quality sleep each night. Talk to your health care provider about how much sleep you need each night.  Do exercises, such as arm curls and leg raises, for 30 minutes at least 2 days a week or as told by your health care provider. This helps build muscle strength.  Consider working with a physical therapist or trainer who can develop an exercise plan to help you gain muscle strength. General instructions   Take over-the-counter and prescription medicines only as told by your health care provider.  Eat a healthy, well-balanced diet. This includes: ? Proteins to build muscles, such as lean meats and fish. ? Fresh fruits and vegetables. ? Carbohydrates to boost energy, such as whole grains.  Drink enough fluid to keep your urine pale yellow.  Keep all follow-up visits as told by your health care provider. This is important. Contact a health care provider if your weakness:  Does not improve or gets worse.  Affects your  ability to think clearly.  Affects your ability to do your normal daily activities. Get help right away if you:  Develop sudden weakness, especially on one side of your face or body.  Have chest pain.  Have trouble breathing or shortness of breath.  Have problems with your vision.  Have trouble talking or swallowing.  Have trouble standing or walking.  Are light-headed or lose consciousness. Summary  Weakness is a lack of strength. You may feel weak all over your body or just in one specific part of your body.  Weakness can be caused by a variety of things. In some cases, the cause may be unknown.  Rest as needed, and try to get enough sleep. Most adults need 7-8 hours of quality sleep each night.  Eat a healthy, well-balanced diet. This information is not intended to replace advice given to you by your health care provider. Make sure you discuss any questions you have with your health care provider. Document Revised: 07/08/2018 Document Reviewed: 07/08/2018 Elsevier Patient Education  2020 Elsevier Inc.  

## 2020-07-07 NOTE — Progress Notes (Signed)
Subjective:    Patient ID: [name], female    DOB: Apr 27, 1936, 84 y.o.   MRN: [id number]  Chief Complaint  Patient presents with  . Referral    in home OT/PT    HPI  Pt presents to the office today with weakness and frequent falling. She reports last year she had surgery on T7-T9 thoracic laminectomies for drainage and shunting of arachnoid cyst on 01/2019. She reports after her surgery because of COVID she did not have PT "like I should".   She states since this surgery she has been weak. She uses a rolling walker when walking, but is in a wheelchair today.   She has chronic back pain of 9 out 10. She currently goes to a Pain Clinic and was given Buprenorphine 10 mcg patches. She states she only applied one because she noticed she was "breaking out". She has brought in the unused patches and wants [place] to dispose of them.     Review of Systems  Constitutional: Positive for fatigue.  Musculoskeletal: Positive for back pain and gait problem.  Neurological: Positive for weakness.  All other systems reviewed and are negative.      Objective:   Physical Exam Vitals reviewed.  Constitutional:      Appearance: She is well-developed.  HENT:     Head: Normocephalic and atraumatic.  Eyes:     Pupils: Pupils are equal, round, and reactive to light.  Neck:     Thyroid: No thyromegaly.  Cardiovascular:     Rate and Rhythm: Normal rate and regular rhythm.     Heart sounds: Normal heart sounds. No murmur heard.   Pulmonary:     Effort: Pulmonary effort is normal. No respiratory distress.     Breath sounds: Normal breath sounds. No wheezing.  Abdominal:     General: Bowel sounds are normal. There is no distension.     Palpations: Abdomen is soft.     Tenderness: There is no abdominal tenderness.  Musculoskeletal:        General: No tenderness.     Cervical back: Normal range of motion and neck supple.     Comments: Pain in lumbar with flexion  Skin:    General: Skin is  warm and dry.     Findings: Rash (erythemas papule rash in inner thighs) present.  Neurological:     Mental Status: She is alert and oriented to person, place, and time.     Cranial Nerves: No cranial nerve deficit.     Motor: Weakness present.     Gait: Gait abnormal.     Deep Tendon Reflexes: Reflexes are normal and symmetric.     Comments: Pt in wheelchair, she is able to stand  Psychiatric:        Behavior: Behavior normal.        Thought Content: Thought content normal.        Judgment: Judgment normal.       BP 119/77   Pulse 71   Temp (!) 96.5 F (35.8 C) (Temporal)   Ht 5' (1.524 m)   SpO2 95%   BMI 24.80 kg/m      Assessment & Plan:  [name] comes in today with chief complaint of Referral (in home OT/PT)   Diagnosis and orders addressed:  1. Weakness - Ambulatory referral to Home Health  2. Frequent falls - Ambulatory referral to [facility]  3. Chronic low back pain, unspecified back pain laterality, unspecified whether sciatica  present Keep follow up with Pain Clinic  - Ambulatory referral to [facility] [place] for PT  Keep follow up with PCP and specialists Fall precautions discussed  Evelina Dun, FNP

## 2020-07-11 ENCOUNTER — Ambulatory Visit (INDEPENDENT_AMBULATORY_CARE_PROVIDER_SITE_OTHER): Payer: Medicare Other | Admitting: Family Medicine

## 2020-07-11 ENCOUNTER — Other Ambulatory Visit: Payer: Self-pay

## 2020-07-11 ENCOUNTER — Other Ambulatory Visit: Payer: Self-pay | Admitting: Family Medicine

## 2020-07-11 ENCOUNTER — Encounter: Payer: Self-pay | Admitting: Family Medicine

## 2020-07-11 VITALS — BP 120/77 | HR 67 | Temp 97.5°F | Ht 59.0 in | Wt 119.0 lb

## 2020-07-11 DIAGNOSIS — G8929 Other chronic pain: Secondary | ICD-10-CM | POA: Diagnosis not present

## 2020-07-11 DIAGNOSIS — E039 Hypothyroidism, unspecified: Secondary | ICD-10-CM

## 2020-07-11 DIAGNOSIS — M545 Low back pain, unspecified: Secondary | ICD-10-CM

## 2020-07-11 DIAGNOSIS — K5909 Other constipation: Secondary | ICD-10-CM

## 2020-07-11 DIAGNOSIS — I480 Paroxysmal atrial fibrillation: Secondary | ICD-10-CM

## 2020-07-11 DIAGNOSIS — Z7409 Other reduced mobility: Secondary | ICD-10-CM | POA: Diagnosis not present

## 2020-07-11 DIAGNOSIS — F411 Generalized anxiety disorder: Secondary | ICD-10-CM

## 2020-07-11 DIAGNOSIS — Z789 Other specified health status: Secondary | ICD-10-CM | POA: Diagnosis not present

## 2020-07-11 DIAGNOSIS — M810 Age-related osteoporosis without current pathological fracture: Secondary | ICD-10-CM

## 2020-07-11 MED ORDER — ALPRAZOLAM 0.5 MG PO TABS: 0.5000 mg | ORAL_TABLET | Freq: Every evening | ORAL | 2 refills | Status: AC | PRN

## 2020-07-11 MED ORDER — DENOSUMAB 60 MG/ML ~~LOC~~ SOSY
60.0000 mg | PREFILLED_SYRINGE | Freq: Once | SUBCUTANEOUS | Status: AC
  Administered 2020-07-11: 60 mg via SUBCUTANEOUS

## 2020-07-11 MED ORDER — LIDOCAINE 5 % EX PTCH: 1.0000 | MEDICATED_PATCH | CUTANEOUS | 12 refills | Status: DC

## 2020-07-11 MED ORDER — METHYLPREDNISOLONE ACETATE 40 MG/ML IJ SUSP
40.0000 mg | Freq: Once | INTRAMUSCULAR | Status: AC
  Administered 2020-07-11: 40 mg via INTRAMUSCULAR

## 2020-07-11 NOTE — Progress Notes (Signed)
Subjective: CC: Anxiety disorder, insomnia, chronic low back pain PCP: Janora Norlander, DO YHC:[facility] [name] is a 84 y.o. female presenting to clinic today for:  1.  Chronic low back pain with impairment in ADLs and IADLs/anxiety disorder Patient has not yet started home health but apparently she was approved today.  She notes ongoing chronic back pain, gait instability and difficulty with ambulation.  She does not use a rolling walker religiously.  She has had a couple of falls which she describes as falling backwards onto her buttocks.  Denies having hit her head.  Denies any loss of consciousness or preceding dizziness.  She continues to take a full tablet of Xanax every night at bedtime.  She notes intolerance to the pain patch that was provided at pain management and states that she does not want to be on any opioids.  Unfortunately cannot tolerate oral NSAIDs secondary to chronic anticoagulation.  She is taking Tylenol but this is not very helpful.  She receives assistance from her daughter-in-law.  2.  Hypothyroidism Patient reports compliance with her Synthroid 25 mcg daily.  Does not report a change in voice, difficulty swallowing or tremor.  ROS: Per HPI  Allergies  Allergen Reactions   Naproxen Other (See Comments)    Tongue swelling   Penicillins Swelling    Swelling around site Has patient had a PCN reaction causing immediate rash, facial/tongue/throat swelling, SOB or lightheadedness with hypotension: Yes Has patient had a PCN reaction causing severe rash involving mucus membranes or skin necrosis: No Has patient had a PCN reaction that required hospitalization: No Has patient had a PCN reaction occurring within the last 10 years: No If all of the above answers are "NO", then may proceed with Cephalosporin use.    Sulfonamide Derivatives Hives   Aspirin Other (See Comments)    REACTION: regular strength causes "heart to beat fast"   Captopril Hypertension    Strawberry Extract Other (See Comments)   Clindamycin/Lincomycin Other (See Comments)    unknown   Past Medical History:  Diagnosis Date   Anxiety    Arthritis    Asthmatic bronchitis    Atrial fibrillation (HCC)    Bowel obstruction (HCC)    blockage   CAD (coronary artery disease)    Stent to RI 2003.  Myoview 2013 no ischemia.   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 May use 1/2 tablet during the day IF needed for panic attack., Disp: 45 tablet, Rfl: 2   Ascorbic Acid (VITAMIN C) 1000 MG  tablet, Take 1,000 mg by mouth daily., Disp: , Rfl:    beta carotene 25000 UNIT capsule, Take 25,000 Units by mouth daily., Disp: , Rfl:    budesonide (PULMICORT) 0.5 MG/2ML nebulizer solution, Take 2 mLs (0.5 mg total) by nebulization 2 (two) times daily., Disp: 120 mL, Rfl: 1   Coenzyme Q10 10 MG capsule, Take 10 mg by mouth daily., Disp: , Rfl:    cyclobenzaprine (FLEXERIL) 10 MG tablet, Take 10 mg by mouth 3 (three) times daily as needed., Disp: , Rfl:    denosumab (PROLIA) 60 MG/ML SOSY injection, Inject 60 mg every 6 (six) months into the skin. Administer in upper arm, thigh,  or abdomen, Disp: 1 mL, Rfl: 0   dicyclomine (BENTYL) 10 MG capsule, Take 1 capsule (10 mg total) by mouth 4 (four) times daily as needed for spasms., Disp: 30 capsule, Rfl: 0   diltiazem (CARDIZEM CD) 120 MG 24 hr capsule, Take 1 capsule (120 mg total) by mouth daily., Disp: 90 capsule, Rfl: 0   fluticasone (FLONASE) 50 MCG/ACT nasal spray, Place 2 sprays into both nostrils daily., Disp: , Rfl:    levothyroxine (SYNTHROID) 25 MCG tablet, Take 2 tablets (50 mcg total) by mouth daily before breakfast., Disp: 60 tablet, Rfl: 5   linaclotide (LINZESS) 145 MCG CAPS capsule, Take 1 capsule (145 mcg total) by mouth daily. To regulate bowel movements, Disp: 30 capsule, Rfl: 5   loratadine (CLARITIN) 10 MG tablet, Take 1 tablet (10 mg total) by mouth daily., Disp: 90 tablet, Rfl: 1   meclizine (ANTIVERT) 25 MG tablet, Take 1 tablet (25 mg total) by mouth 3 (three) times daily as needed for dizziness., Disp: 30 tablet, Rfl: 0   metoprolol tartrate (LOPRESSOR) 25 MG tablet, TAKE 2 TABLETS IN THE MORNING, AND 1 TABLET IN THE EVENING, Disp: 270 tablet, Rfl: 3   montelukast (SINGULAIR) 10 MG tablet, TAKE 1 TABLET ONCE A DAY, Disp: 90 tablet, Rfl: 1   Multiple Vitamin (MULTIVITAMIN) tablet, Take 1 tablet by mouth daily., Disp: , Rfl:    nitroGLYCERIN (NITROSTAT) 0.4 MG SL tablet, Place 1 tablet (0.4 mg total) under the tongue every 5 (five) minutes as needed for chest pain., Disp: 30 tablet, Rfl: 0   omega-3 acid ethyl esters (LOVAZA) 1 g capsule, Take 2 capsules (2 g total) by mouth 2 (two) times daily., Disp: 120 capsule, Rfl: 0   omeprazole-sodium bicarbonate (ZEGERID) 40-1100 MG capsule, TAKE (1) CAPSULE DAILY, Disp: 30 capsule, Rfl: 5   ondansetron (ZOFRAN) 4 MG tablet, TAKE 1 TABLET EVERY 6 HOURS AS NEEDED FOR NAUSEA & VOMITING, Disp: 20 tablet, Rfl: 0   polyethylene glycol (MIRALAX / GLYCOLAX) 17 g packet, Take 17 g by mouth daily., Disp: , Rfl:    rosuvastatin (CRESTOR) 20 MG tablet, TAKE 1  TABLET ONCE DAILY, Disp: 90 tablet, Rfl: 0   sennosides-docusate sodium (SENOKOT-S) 8.6-50 MG tablet, Take 1 tablet by mouth at bedtime., Disp: , Rfl:    sertraline (ZOLOFT) 100 MG tablet, Take 1 tablet (100 mg total) by mouth daily., Disp: 90 tablet, Rfl: 3   tamsulosin (FLOMAX) 0.4 MG CAPS capsule, TAKE (1) CAPSULE DAILY, Disp: 90 capsule, Rfl: 0   XARELTO 20 MG TABS tablet, TAKE 1 TABLET ONCE DAILY, Disp: 90 tablet, Rfl: 0 Social History   Socioeconomic History   Marital status: Widowed    Spouse name: Not on file   Number of children: 2   Years of education: Not on file   Highest education level:  Not on file  Occupational History   Occupation: Retired  Tobacco Use   Smoking status: Former Smoker    Packs/day: 1.00    Years: 30.00    Pack years: 30.00    Types: Cigarettes    Quit date: 12/16/1989    Years since quitting: 30.5   Smokeless tobacco: Never Used   Tobacco comment: smoked off & on  Vaping Use   Vaping Use: Never used  Substance and Sexual Activity   Alcohol use: No    Alcohol/week: 0.0 standard drinks   Drug use: No   Sexual activity: Not Currently  Other Topics Concern   Not on file  Social History Narrative   Originally from [place]. Always lived in [place]. No international travel. Has prior travel to [place], [place], [place], [place], [place], & [place]. No recent travel. She has a cat currently. No prior bird, mold, or hot tub exposure. 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 No known asbestos exposure. 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    Social Determinants of Health   Financial Resource Strain:    Difficulty of Paying Living Expenses:   Food Insecurity:    Worried About Charity fundraiser in the Last Year:    Arboriculturist in the Last Year:   Transportation Needs:    Film/video editor (Medical):    Lack of Transportation (Non-Medical):   Physical Activity:    Days  of Exercise per Week:    Minutes of Exercise per Session:   Stress:    Feeling of Stress :   Social Connections:    Frequency of Communication with Friends and Family:    Frequency of Social Gatherings with Friends and Family:    Attends Religious Services:    Active Member of Clubs or Organizations:    Attends Music therapist:    Marital Status:   Intimate Partner Violence:    Fear of Current or Ex-Partner:    Emotionally Abused:    Physically Abused:    Sexually Abused:    Family History  Problem Relation Age of Onset   Prostate cancer Brother    Cancer Brother        PROSTATE   Heart disease Mother    Asthma Mother    Congestive Heart Failure Mother    Emphysema Sister    COPD Sister    Stroke Sister    Heart disease Sister    Emphysema Brother    Rheumatologic disease Neg Hx     Objective: Office vital signs reviewed. BP 120/77    Pulse 67    Temp (!) 97.5 F (36.4 C) (Temporal)    Ht 4\' 11"  (1.499 m)    Wt 119 lb (54 kg)    SpO2 95%    BMI 24.04 kg/m   Physical Examination:  General: Awake, alert, chronically ill appearing female, No acute distress HEENT: Normal, sclera white, MMM Cardio: regular rate and rhythm, S1S2 heard, no murmurs appreciated Pulm: clear to auscultation bilaterally, no wheezes, rhonchi or rales; normal work of breathing on room air Extremities: no edema MSK: arrives in wheelchair Skin: dry; intact; no rashes or lesions Psych: Mood stable, speech normal, affect appropriate Depression screen [facility] 2/9 07/11/2020 07/07/2020 03/08/2020  Decreased Interest 1 0 1  Down, Depressed, Hopeless 1 0 1  PHQ - 2 Score 2 0 2  Altered sleeping 1 - 0  Tired,  decreased energy 3 - 1  Change in appetite 2 - 1  Feeling bad or failure about yourself  1 - 1  Trouble concentrating 1 - 1  Moving slowly or fidgety/restless 1 - 1  Suicidal thoughts 0 - 1  PHQ-9 Score 11 - 8  Difficult doing work/chores - - Somewhat difficult    Some recent data might be hidden   Assessment/ Plan: 84 y.o. female   1. Acquired hypothyroidism Asymptomatic.  No labs needed today  2. Paroxysmal atrial fibrillation (HCC) Seems to be in regular rate and rhythm  3. Impaired mobility and ADLs Trial of lidocaine patches. - lidocaine (LIDODERM) 5 %; Place 1 patch onto the skin daily. Remove & Discard patch within 12 hours or as directed by MD  Dispense: 30 patch; Refill: 12  4. Chronic bilateral low back pain without sciatica - lidocaine (LIDODERM) 5 %; Place 1 patch onto the skin daily. Remove & Discard patch within 12 hours or as directed by MD  Dispense: 30 patch; Refill: 12 - methylPREDNISolone acetate (DEPO-MEDROL) injection 40 mg  5. Generalized anxiety disorder We will work on reducing Xanax.  I have advised her to go down to 1/2 tablet nightly.  Would ultimately like to get her off of these medicines considering recurrent falls, despite her feeling that this is not a medication to use.  I think she is a high risk patient on this medication particularly given anticoagulation and known osteoporosis - ALPRAZolam (XANAX) 0.5 MG tablet; Take 1-2 tablets (0.5-1 mg total) by mouth at bedtime as needed for anxiety.  Dispense: 45 tablet; Refill: 2  6. Age-related osteoporosis without current pathological fracture Not discussed at length today but her injection was doing therefore was provided during today's office visit - denosumab (PROLIA) injection 60 mg   No orders of the defined types were placed in this encounter.  No orders of the defined types were placed in this encounter.    Janora Norlander, DO [place] [phone]

## 2020-07-11 NOTE — Progress Notes (Signed)
Prolia injection given to right arm Patient tolerated well Patient supplied medication

## 2020-07-11 NOTE — Patient Instructions (Signed)
I have changed her Xanax pill to the 0.5 mg tablets.  This is the half dose of what you are currently taking.  Okay to take 1 tablet at bedtime if needed.  Your prescription will say okay to take 1 to 2 tablets but I really want you to try only taking 1.  Okay to take melatonin at bedtime for sleep.  Start with 2 mg and may increase up to a maximum of 10 mg at bedtime.  Lidocaine patches have been sent.  Hopefully this will help some with your back.

## 2020-07-12 ENCOUNTER — Other Ambulatory Visit: Payer: Self-pay | Admitting: Family Medicine

## 2020-07-14 DIAGNOSIS — I700 Atherosclerosis of aorta: Secondary | ICD-10-CM | POA: Diagnosis not present

## 2020-07-14 DIAGNOSIS — Z87891 Personal history of nicotine dependence: Secondary | ICD-10-CM | POA: Diagnosis not present

## 2020-07-14 DIAGNOSIS — K5900 Constipation, unspecified: Secondary | ICD-10-CM | POA: Diagnosis not present

## 2020-07-14 DIAGNOSIS — M810 Age-related osteoporosis without current pathological fracture: Secondary | ICD-10-CM | POA: Diagnosis not present

## 2020-07-14 DIAGNOSIS — E039 Hypothyroidism, unspecified: Secondary | ICD-10-CM | POA: Diagnosis not present

## 2020-07-14 DIAGNOSIS — M545 Low back pain: Secondary | ICD-10-CM | POA: Diagnosis not present

## 2020-07-14 DIAGNOSIS — E871 Hypo-osmolality and hyponatremia: Secondary | ICD-10-CM | POA: Diagnosis not present

## 2020-07-14 DIAGNOSIS — Z87440 Personal history of urinary (tract) infections: Secondary | ICD-10-CM | POA: Diagnosis not present

## 2020-07-14 DIAGNOSIS — Z7984 Long term (current) use of oral hypoglycemic drugs: Secondary | ICD-10-CM | POA: Diagnosis not present

## 2020-07-14 DIAGNOSIS — K219 Gastro-esophageal reflux disease without esophagitis: Secondary | ICD-10-CM | POA: Diagnosis not present

## 2020-07-14 DIAGNOSIS — G890 Central pain syndrome: Secondary | ICD-10-CM | POA: Diagnosis not present

## 2020-07-14 DIAGNOSIS — Z7901 Long term (current) use of anticoagulants: Secondary | ICD-10-CM | POA: Diagnosis not present

## 2020-07-14 DIAGNOSIS — E785 Hyperlipidemia, unspecified: Secondary | ICD-10-CM | POA: Diagnosis not present

## 2020-07-14 DIAGNOSIS — I110 Hypertensive heart disease with heart failure: Secondary | ICD-10-CM | POA: Diagnosis not present

## 2020-07-14 DIAGNOSIS — J449 Chronic obstructive pulmonary disease, unspecified: Secondary | ICD-10-CM | POA: Diagnosis not present

## 2020-07-14 DIAGNOSIS — I2510 Atherosclerotic heart disease of native coronary artery without angina pectoris: Secondary | ICD-10-CM | POA: Diagnosis not present

## 2020-07-14 DIAGNOSIS — I5032 Chronic diastolic (congestive) heart failure: Secondary | ICD-10-CM | POA: Diagnosis not present

## 2020-07-14 DIAGNOSIS — Z8614 Personal history of Methicillin resistant Staphylococcus aureus infection: Secondary | ICD-10-CM | POA: Diagnosis not present

## 2020-07-14 DIAGNOSIS — I480 Paroxysmal atrial fibrillation: Secondary | ICD-10-CM | POA: Diagnosis not present

## 2020-07-14 DIAGNOSIS — G8929 Other chronic pain: Secondary | ICD-10-CM | POA: Diagnosis not present

## 2020-07-14 DIAGNOSIS — R296 Repeated falls: Secondary | ICD-10-CM | POA: Diagnosis not present

## 2020-07-17 ENCOUNTER — Telehealth: Payer: Self-pay | Admitting: Family Medicine

## 2020-07-17 NOTE — Telephone Encounter (Signed)
Pt has been contacted by [facility], she will talk with PT when they come this week about need for nursing.

## 2020-07-17 NOTE — Telephone Encounter (Signed)
Does she need a face to face ?

## 2020-07-17 NOTE — Telephone Encounter (Signed)
NA/NVM. Pt is to be getting PT from [facility] is the only order. PT can call and request to add nursing if they feel there is a need

## 2020-07-19 DIAGNOSIS — I110 Hypertensive heart disease with heart failure: Secondary | ICD-10-CM | POA: Diagnosis not present

## 2020-07-19 DIAGNOSIS — I480 Paroxysmal atrial fibrillation: Secondary | ICD-10-CM | POA: Diagnosis not present

## 2020-07-19 DIAGNOSIS — M545 Low back pain: Secondary | ICD-10-CM | POA: Diagnosis not present

## 2020-07-19 DIAGNOSIS — J449 Chronic obstructive pulmonary disease, unspecified: Secondary | ICD-10-CM | POA: Diagnosis not present

## 2020-07-19 DIAGNOSIS — K5900 Constipation, unspecified: Secondary | ICD-10-CM | POA: Diagnosis not present

## 2020-07-19 DIAGNOSIS — I5032 Chronic diastolic (congestive) heart failure: Secondary | ICD-10-CM | POA: Diagnosis not present

## 2020-07-19 DIAGNOSIS — Z7984 Long term (current) use of oral hypoglycemic drugs: Secondary | ICD-10-CM | POA: Diagnosis not present

## 2020-07-19 DIAGNOSIS — I700 Atherosclerosis of aorta: Secondary | ICD-10-CM | POA: Diagnosis not present

## 2020-07-19 DIAGNOSIS — Z87891 Personal history of nicotine dependence: Secondary | ICD-10-CM | POA: Diagnosis not present

## 2020-07-19 DIAGNOSIS — E871 Hypo-osmolality and hyponatremia: Secondary | ICD-10-CM | POA: Diagnosis not present

## 2020-07-19 DIAGNOSIS — E039 Hypothyroidism, unspecified: Secondary | ICD-10-CM | POA: Diagnosis not present

## 2020-07-19 DIAGNOSIS — K219 Gastro-esophageal reflux disease without esophagitis: Secondary | ICD-10-CM | POA: Diagnosis not present

## 2020-07-19 DIAGNOSIS — G890 Central pain syndrome: Secondary | ICD-10-CM | POA: Diagnosis not present

## 2020-07-19 DIAGNOSIS — Z87440 Personal history of urinary (tract) infections: Secondary | ICD-10-CM | POA: Diagnosis not present

## 2020-07-19 DIAGNOSIS — E785 Hyperlipidemia, unspecified: Secondary | ICD-10-CM | POA: Diagnosis not present

## 2020-07-19 DIAGNOSIS — I2510 Atherosclerotic heart disease of native coronary artery without angina pectoris: Secondary | ICD-10-CM | POA: Diagnosis not present

## 2020-07-19 DIAGNOSIS — Z8614 Personal history of Methicillin resistant Staphylococcus aureus infection: Secondary | ICD-10-CM | POA: Diagnosis not present

## 2020-07-19 DIAGNOSIS — M810 Age-related osteoporosis without current pathological fracture: Secondary | ICD-10-CM | POA: Diagnosis not present

## 2020-07-19 DIAGNOSIS — R296 Repeated falls: Secondary | ICD-10-CM | POA: Diagnosis not present

## 2020-07-19 DIAGNOSIS — G8929 Other chronic pain: Secondary | ICD-10-CM | POA: Diagnosis not present

## 2020-07-19 DIAGNOSIS — Z7901 Long term (current) use of anticoagulants: Secondary | ICD-10-CM | POA: Diagnosis not present

## 2020-07-20 DIAGNOSIS — Z7901 Long term (current) use of anticoagulants: Secondary | ICD-10-CM | POA: Diagnosis not present

## 2020-07-20 DIAGNOSIS — E039 Hypothyroidism, unspecified: Secondary | ICD-10-CM | POA: Diagnosis not present

## 2020-07-20 DIAGNOSIS — E871 Hypo-osmolality and hyponatremia: Secondary | ICD-10-CM | POA: Diagnosis not present

## 2020-07-20 DIAGNOSIS — I110 Hypertensive heart disease with heart failure: Secondary | ICD-10-CM | POA: Diagnosis not present

## 2020-07-20 DIAGNOSIS — G890 Central pain syndrome: Secondary | ICD-10-CM | POA: Diagnosis not present

## 2020-07-20 DIAGNOSIS — M810 Age-related osteoporosis without current pathological fracture: Secondary | ICD-10-CM | POA: Diagnosis not present

## 2020-07-20 DIAGNOSIS — K219 Gastro-esophageal reflux disease without esophagitis: Secondary | ICD-10-CM | POA: Diagnosis not present

## 2020-07-20 DIAGNOSIS — Z87440 Personal history of urinary (tract) infections: Secondary | ICD-10-CM | POA: Diagnosis not present

## 2020-07-20 DIAGNOSIS — Z7984 Long term (current) use of oral hypoglycemic drugs: Secondary | ICD-10-CM | POA: Diagnosis not present

## 2020-07-20 DIAGNOSIS — Z87891 Personal history of nicotine dependence: Secondary | ICD-10-CM | POA: Diagnosis not present

## 2020-07-20 DIAGNOSIS — J449 Chronic obstructive pulmonary disease, unspecified: Secondary | ICD-10-CM | POA: Diagnosis not present

## 2020-07-20 DIAGNOSIS — I5032 Chronic diastolic (congestive) heart failure: Secondary | ICD-10-CM | POA: Diagnosis not present

## 2020-07-20 DIAGNOSIS — G8929 Other chronic pain: Secondary | ICD-10-CM | POA: Diagnosis not present

## 2020-07-20 DIAGNOSIS — E785 Hyperlipidemia, unspecified: Secondary | ICD-10-CM | POA: Diagnosis not present

## 2020-07-20 DIAGNOSIS — I2510 Atherosclerotic heart disease of native coronary artery without angina pectoris: Secondary | ICD-10-CM | POA: Diagnosis not present

## 2020-07-20 DIAGNOSIS — K5900 Constipation, unspecified: Secondary | ICD-10-CM | POA: Diagnosis not present

## 2020-07-20 DIAGNOSIS — I480 Paroxysmal atrial fibrillation: Secondary | ICD-10-CM | POA: Diagnosis not present

## 2020-07-20 DIAGNOSIS — Z8614 Personal history of Methicillin resistant Staphylococcus aureus infection: Secondary | ICD-10-CM | POA: Diagnosis not present

## 2020-07-20 DIAGNOSIS — R296 Repeated falls: Secondary | ICD-10-CM | POA: Diagnosis not present

## 2020-07-20 DIAGNOSIS — M545 Low back pain: Secondary | ICD-10-CM | POA: Diagnosis not present

## 2020-07-20 DIAGNOSIS — I700 Atherosclerosis of aorta: Secondary | ICD-10-CM | POA: Diagnosis not present

## 2020-07-24 ENCOUNTER — Other Ambulatory Visit: Payer: Self-pay | Admitting: Family Medicine

## 2020-07-25 DIAGNOSIS — M545 Low back pain: Secondary | ICD-10-CM | POA: Diagnosis not present

## 2020-07-25 DIAGNOSIS — Z7901 Long term (current) use of anticoagulants: Secondary | ICD-10-CM | POA: Diagnosis not present

## 2020-07-25 DIAGNOSIS — G8929 Other chronic pain: Secondary | ICD-10-CM | POA: Diagnosis not present

## 2020-07-25 DIAGNOSIS — R296 Repeated falls: Secondary | ICD-10-CM | POA: Diagnosis not present

## 2020-07-25 DIAGNOSIS — I700 Atherosclerosis of aorta: Secondary | ICD-10-CM | POA: Diagnosis not present

## 2020-07-25 DIAGNOSIS — E871 Hypo-osmolality and hyponatremia: Secondary | ICD-10-CM | POA: Diagnosis not present

## 2020-07-25 DIAGNOSIS — E785 Hyperlipidemia, unspecified: Secondary | ICD-10-CM | POA: Diagnosis not present

## 2020-07-25 DIAGNOSIS — I480 Paroxysmal atrial fibrillation: Secondary | ICD-10-CM | POA: Diagnosis not present

## 2020-07-25 DIAGNOSIS — Z8614 Personal history of Methicillin resistant Staphylococcus aureus infection: Secondary | ICD-10-CM | POA: Diagnosis not present

## 2020-07-25 DIAGNOSIS — K5900 Constipation, unspecified: Secondary | ICD-10-CM | POA: Diagnosis not present

## 2020-07-25 DIAGNOSIS — M810 Age-related osteoporosis without current pathological fracture: Secondary | ICD-10-CM | POA: Diagnosis not present

## 2020-07-25 DIAGNOSIS — I110 Hypertensive heart disease with heart failure: Secondary | ICD-10-CM | POA: Diagnosis not present

## 2020-07-25 DIAGNOSIS — K219 Gastro-esophageal reflux disease without esophagitis: Secondary | ICD-10-CM | POA: Diagnosis not present

## 2020-07-25 DIAGNOSIS — Z87440 Personal history of urinary (tract) infections: Secondary | ICD-10-CM | POA: Diagnosis not present

## 2020-07-25 DIAGNOSIS — I2510 Atherosclerotic heart disease of native coronary artery without angina pectoris: Secondary | ICD-10-CM | POA: Diagnosis not present

## 2020-07-25 DIAGNOSIS — G890 Central pain syndrome: Secondary | ICD-10-CM | POA: Diagnosis not present

## 2020-07-25 DIAGNOSIS — Z87891 Personal history of nicotine dependence: Secondary | ICD-10-CM | POA: Diagnosis not present

## 2020-07-25 DIAGNOSIS — E039 Hypothyroidism, unspecified: Secondary | ICD-10-CM | POA: Diagnosis not present

## 2020-07-25 DIAGNOSIS — J449 Chronic obstructive pulmonary disease, unspecified: Secondary | ICD-10-CM | POA: Diagnosis not present

## 2020-07-25 DIAGNOSIS — Z7984 Long term (current) use of oral hypoglycemic drugs: Secondary | ICD-10-CM | POA: Diagnosis not present

## 2020-07-25 DIAGNOSIS — I5032 Chronic diastolic (congestive) heart failure: Secondary | ICD-10-CM | POA: Diagnosis not present

## 2020-07-27 ENCOUNTER — Ambulatory Visit: Payer: Medicare Other | Admitting: Family

## 2020-07-27 DIAGNOSIS — I480 Paroxysmal atrial fibrillation: Secondary | ICD-10-CM | POA: Diagnosis not present

## 2020-07-27 DIAGNOSIS — Z87440 Personal history of urinary (tract) infections: Secondary | ICD-10-CM | POA: Diagnosis not present

## 2020-07-27 DIAGNOSIS — I110 Hypertensive heart disease with heart failure: Secondary | ICD-10-CM | POA: Diagnosis not present

## 2020-07-27 DIAGNOSIS — G890 Central pain syndrome: Secondary | ICD-10-CM | POA: Diagnosis not present

## 2020-07-27 DIAGNOSIS — K5900 Constipation, unspecified: Secondary | ICD-10-CM | POA: Diagnosis not present

## 2020-07-27 DIAGNOSIS — I700 Atherosclerosis of aorta: Secondary | ICD-10-CM | POA: Diagnosis not present

## 2020-07-27 DIAGNOSIS — Z7984 Long term (current) use of oral hypoglycemic drugs: Secondary | ICD-10-CM | POA: Diagnosis not present

## 2020-07-27 DIAGNOSIS — E871 Hypo-osmolality and hyponatremia: Secondary | ICD-10-CM | POA: Diagnosis not present

## 2020-07-27 DIAGNOSIS — Z87891 Personal history of nicotine dependence: Secondary | ICD-10-CM | POA: Diagnosis not present

## 2020-07-27 DIAGNOSIS — E785 Hyperlipidemia, unspecified: Secondary | ICD-10-CM | POA: Diagnosis not present

## 2020-07-27 DIAGNOSIS — Z7901 Long term (current) use of anticoagulants: Secondary | ICD-10-CM | POA: Diagnosis not present

## 2020-07-27 DIAGNOSIS — K219 Gastro-esophageal reflux disease without esophagitis: Secondary | ICD-10-CM | POA: Diagnosis not present

## 2020-07-27 DIAGNOSIS — E039 Hypothyroidism, unspecified: Secondary | ICD-10-CM | POA: Diagnosis not present

## 2020-07-27 DIAGNOSIS — I2510 Atherosclerotic heart disease of native coronary artery without angina pectoris: Secondary | ICD-10-CM | POA: Diagnosis not present

## 2020-07-27 DIAGNOSIS — G8929 Other chronic pain: Secondary | ICD-10-CM | POA: Diagnosis not present

## 2020-07-27 DIAGNOSIS — R296 Repeated falls: Secondary | ICD-10-CM | POA: Diagnosis not present

## 2020-07-27 DIAGNOSIS — I5032 Chronic diastolic (congestive) heart failure: Secondary | ICD-10-CM | POA: Diagnosis not present

## 2020-07-27 DIAGNOSIS — J449 Chronic obstructive pulmonary disease, unspecified: Secondary | ICD-10-CM | POA: Diagnosis not present

## 2020-07-27 DIAGNOSIS — M810 Age-related osteoporosis without current pathological fracture: Secondary | ICD-10-CM | POA: Diagnosis not present

## 2020-07-27 DIAGNOSIS — M545 Low back pain: Secondary | ICD-10-CM | POA: Diagnosis not present

## 2020-07-27 DIAGNOSIS — Z8614 Personal history of Methicillin resistant Staphylococcus aureus infection: Secondary | ICD-10-CM | POA: Diagnosis not present

## 2020-07-31 ENCOUNTER — Ambulatory Visit (INDEPENDENT_AMBULATORY_CARE_PROVIDER_SITE_OTHER): Payer: Medicare Other

## 2020-07-31 ENCOUNTER — Other Ambulatory Visit: Payer: Self-pay

## 2020-07-31 DIAGNOSIS — I110 Hypertensive heart disease with heart failure: Secondary | ICD-10-CM

## 2020-07-31 DIAGNOSIS — I700 Atherosclerosis of aorta: Secondary | ICD-10-CM

## 2020-07-31 DIAGNOSIS — K5900 Constipation, unspecified: Secondary | ICD-10-CM | POA: Diagnosis not present

## 2020-07-31 DIAGNOSIS — Z87440 Personal history of urinary (tract) infections: Secondary | ICD-10-CM

## 2020-07-31 DIAGNOSIS — Z7901 Long term (current) use of anticoagulants: Secondary | ICD-10-CM

## 2020-07-31 DIAGNOSIS — E039 Hypothyroidism, unspecified: Secondary | ICD-10-CM

## 2020-07-31 DIAGNOSIS — Z7984 Long term (current) use of oral hypoglycemic drugs: Secondary | ICD-10-CM

## 2020-07-31 DIAGNOSIS — Z87891 Personal history of nicotine dependence: Secondary | ICD-10-CM | POA: Diagnosis not present

## 2020-07-31 DIAGNOSIS — R296 Repeated falls: Secondary | ICD-10-CM | POA: Diagnosis not present

## 2020-07-31 DIAGNOSIS — M545 Low back pain: Secondary | ICD-10-CM | POA: Diagnosis not present

## 2020-07-31 DIAGNOSIS — I2510 Atherosclerotic heart disease of native coronary artery without angina pectoris: Secondary | ICD-10-CM | POA: Diagnosis not present

## 2020-07-31 DIAGNOSIS — Z8614 Personal history of Methicillin resistant Staphylococcus aureus infection: Secondary | ICD-10-CM | POA: Diagnosis not present

## 2020-07-31 DIAGNOSIS — G890 Central pain syndrome: Secondary | ICD-10-CM

## 2020-07-31 DIAGNOSIS — G8929 Other chronic pain: Secondary | ICD-10-CM | POA: Diagnosis not present

## 2020-07-31 DIAGNOSIS — E871 Hypo-osmolality and hyponatremia: Secondary | ICD-10-CM | POA: Diagnosis not present

## 2020-07-31 DIAGNOSIS — M810 Age-related osteoporosis without current pathological fracture: Secondary | ICD-10-CM | POA: Diagnosis not present

## 2020-07-31 DIAGNOSIS — K219 Gastro-esophageal reflux disease without esophagitis: Secondary | ICD-10-CM | POA: Diagnosis not present

## 2020-07-31 DIAGNOSIS — I5032 Chronic diastolic (congestive) heart failure: Secondary | ICD-10-CM

## 2020-07-31 DIAGNOSIS — I480 Paroxysmal atrial fibrillation: Secondary | ICD-10-CM

## 2020-07-31 DIAGNOSIS — F339 Major depressive disorder, recurrent, unspecified: Secondary | ICD-10-CM

## 2020-07-31 DIAGNOSIS — E785 Hyperlipidemia, unspecified: Secondary | ICD-10-CM | POA: Diagnosis not present

## 2020-07-31 DIAGNOSIS — J449 Chronic obstructive pulmonary disease, unspecified: Secondary | ICD-10-CM | POA: Diagnosis not present

## 2020-08-07 DIAGNOSIS — G8929 Other chronic pain: Secondary | ICD-10-CM | POA: Diagnosis not present

## 2020-08-07 DIAGNOSIS — K219 Gastro-esophageal reflux disease without esophagitis: Secondary | ICD-10-CM | POA: Diagnosis not present

## 2020-08-07 DIAGNOSIS — I480 Paroxysmal atrial fibrillation: Secondary | ICD-10-CM | POA: Diagnosis not present

## 2020-08-07 DIAGNOSIS — Z87440 Personal history of urinary (tract) infections: Secondary | ICD-10-CM | POA: Diagnosis not present

## 2020-08-07 DIAGNOSIS — I110 Hypertensive heart disease with heart failure: Secondary | ICD-10-CM | POA: Diagnosis not present

## 2020-08-07 DIAGNOSIS — K5900 Constipation, unspecified: Secondary | ICD-10-CM | POA: Diagnosis not present

## 2020-08-07 DIAGNOSIS — E785 Hyperlipidemia, unspecified: Secondary | ICD-10-CM | POA: Diagnosis not present

## 2020-08-07 DIAGNOSIS — I5032 Chronic diastolic (congestive) heart failure: Secondary | ICD-10-CM | POA: Diagnosis not present

## 2020-08-07 DIAGNOSIS — Z87891 Personal history of nicotine dependence: Secondary | ICD-10-CM | POA: Diagnosis not present

## 2020-08-07 DIAGNOSIS — I700 Atherosclerosis of aorta: Secondary | ICD-10-CM | POA: Diagnosis not present

## 2020-08-07 DIAGNOSIS — I2510 Atherosclerotic heart disease of native coronary artery without angina pectoris: Secondary | ICD-10-CM | POA: Diagnosis not present

## 2020-08-07 DIAGNOSIS — Z7984 Long term (current) use of oral hypoglycemic drugs: Secondary | ICD-10-CM | POA: Diagnosis not present

## 2020-08-07 DIAGNOSIS — R296 Repeated falls: Secondary | ICD-10-CM | POA: Diagnosis not present

## 2020-08-07 DIAGNOSIS — E039 Hypothyroidism, unspecified: Secondary | ICD-10-CM | POA: Diagnosis not present

## 2020-08-07 DIAGNOSIS — Z7901 Long term (current) use of anticoagulants: Secondary | ICD-10-CM | POA: Diagnosis not present

## 2020-08-07 DIAGNOSIS — E871 Hypo-osmolality and hyponatremia: Secondary | ICD-10-CM | POA: Diagnosis not present

## 2020-08-07 DIAGNOSIS — M810 Age-related osteoporosis without current pathological fracture: Secondary | ICD-10-CM | POA: Diagnosis not present

## 2020-08-07 DIAGNOSIS — Z8614 Personal history of Methicillin resistant Staphylococcus aureus infection: Secondary | ICD-10-CM | POA: Diagnosis not present

## 2020-08-07 DIAGNOSIS — J449 Chronic obstructive pulmonary disease, unspecified: Secondary | ICD-10-CM | POA: Diagnosis not present

## 2020-08-07 DIAGNOSIS — G890 Central pain syndrome: Secondary | ICD-10-CM | POA: Diagnosis not present

## 2020-08-07 DIAGNOSIS — M545 Low back pain: Secondary | ICD-10-CM | POA: Diagnosis not present

## 2020-08-09 DIAGNOSIS — I480 Paroxysmal atrial fibrillation: Secondary | ICD-10-CM | POA: Diagnosis not present

## 2020-08-09 DIAGNOSIS — Z87891 Personal history of nicotine dependence: Secondary | ICD-10-CM | POA: Diagnosis not present

## 2020-08-09 DIAGNOSIS — Z7901 Long term (current) use of anticoagulants: Secondary | ICD-10-CM | POA: Diagnosis not present

## 2020-08-09 DIAGNOSIS — K5900 Constipation, unspecified: Secondary | ICD-10-CM | POA: Diagnosis not present

## 2020-08-09 DIAGNOSIS — J449 Chronic obstructive pulmonary disease, unspecified: Secondary | ICD-10-CM | POA: Diagnosis not present

## 2020-08-09 DIAGNOSIS — M810 Age-related osteoporosis without current pathological fracture: Secondary | ICD-10-CM | POA: Diagnosis not present

## 2020-08-09 DIAGNOSIS — Z7984 Long term (current) use of oral hypoglycemic drugs: Secondary | ICD-10-CM | POA: Diagnosis not present

## 2020-08-09 DIAGNOSIS — M545 Low back pain: Secondary | ICD-10-CM | POA: Diagnosis not present

## 2020-08-09 DIAGNOSIS — G890 Central pain syndrome: Secondary | ICD-10-CM | POA: Diagnosis not present

## 2020-08-09 DIAGNOSIS — I2510 Atherosclerotic heart disease of native coronary artery without angina pectoris: Secondary | ICD-10-CM | POA: Diagnosis not present

## 2020-08-09 DIAGNOSIS — R296 Repeated falls: Secondary | ICD-10-CM | POA: Diagnosis not present

## 2020-08-09 DIAGNOSIS — E785 Hyperlipidemia, unspecified: Secondary | ICD-10-CM | POA: Diagnosis not present

## 2020-08-09 DIAGNOSIS — G8929 Other chronic pain: Secondary | ICD-10-CM | POA: Diagnosis not present

## 2020-08-09 DIAGNOSIS — K219 Gastro-esophageal reflux disease without esophagitis: Secondary | ICD-10-CM | POA: Diagnosis not present

## 2020-08-09 DIAGNOSIS — I5032 Chronic diastolic (congestive) heart failure: Secondary | ICD-10-CM | POA: Diagnosis not present

## 2020-08-09 DIAGNOSIS — I110 Hypertensive heart disease with heart failure: Secondary | ICD-10-CM | POA: Diagnosis not present

## 2020-08-09 DIAGNOSIS — Z87440 Personal history of urinary (tract) infections: Secondary | ICD-10-CM | POA: Diagnosis not present

## 2020-08-09 DIAGNOSIS — E039 Hypothyroidism, unspecified: Secondary | ICD-10-CM | POA: Diagnosis not present

## 2020-08-09 DIAGNOSIS — I700 Atherosclerosis of aorta: Secondary | ICD-10-CM | POA: Diagnosis not present

## 2020-08-09 DIAGNOSIS — Z8614 Personal history of Methicillin resistant Staphylococcus aureus infection: Secondary | ICD-10-CM | POA: Diagnosis not present

## 2020-08-09 DIAGNOSIS — E871 Hypo-osmolality and hyponatremia: Secondary | ICD-10-CM | POA: Diagnosis not present

## 2020-08-10 ENCOUNTER — Other Ambulatory Visit: Payer: Self-pay | Admitting: Family Medicine

## 2020-08-10 DIAGNOSIS — K5909 Other constipation: Secondary | ICD-10-CM

## 2020-08-15 DIAGNOSIS — M545 Low back pain: Secondary | ICD-10-CM | POA: Diagnosis not present

## 2020-08-15 DIAGNOSIS — Z8614 Personal history of Methicillin resistant Staphylococcus aureus infection: Secondary | ICD-10-CM | POA: Diagnosis not present

## 2020-08-15 DIAGNOSIS — Z7984 Long term (current) use of oral hypoglycemic drugs: Secondary | ICD-10-CM | POA: Diagnosis not present

## 2020-08-15 DIAGNOSIS — K219 Gastro-esophageal reflux disease without esophagitis: Secondary | ICD-10-CM | POA: Diagnosis not present

## 2020-08-15 DIAGNOSIS — R296 Repeated falls: Secondary | ICD-10-CM | POA: Diagnosis not present

## 2020-08-15 DIAGNOSIS — M810 Age-related osteoporosis without current pathological fracture: Secondary | ICD-10-CM | POA: Diagnosis not present

## 2020-08-15 DIAGNOSIS — I2510 Atherosclerotic heart disease of native coronary artery without angina pectoris: Secondary | ICD-10-CM | POA: Diagnosis not present

## 2020-08-15 DIAGNOSIS — Z87891 Personal history of nicotine dependence: Secondary | ICD-10-CM | POA: Diagnosis not present

## 2020-08-15 DIAGNOSIS — I110 Hypertensive heart disease with heart failure: Secondary | ICD-10-CM | POA: Diagnosis not present

## 2020-08-15 DIAGNOSIS — I700 Atherosclerosis of aorta: Secondary | ICD-10-CM | POA: Diagnosis not present

## 2020-08-15 DIAGNOSIS — G890 Central pain syndrome: Secondary | ICD-10-CM | POA: Diagnosis not present

## 2020-08-15 DIAGNOSIS — E871 Hypo-osmolality and hyponatremia: Secondary | ICD-10-CM | POA: Diagnosis not present

## 2020-08-15 DIAGNOSIS — G8929 Other chronic pain: Secondary | ICD-10-CM | POA: Diagnosis not present

## 2020-08-15 DIAGNOSIS — E039 Hypothyroidism, unspecified: Secondary | ICD-10-CM | POA: Diagnosis not present

## 2020-08-15 DIAGNOSIS — Z7901 Long term (current) use of anticoagulants: Secondary | ICD-10-CM | POA: Diagnosis not present

## 2020-08-15 DIAGNOSIS — E785 Hyperlipidemia, unspecified: Secondary | ICD-10-CM | POA: Diagnosis not present

## 2020-08-15 DIAGNOSIS — I480 Paroxysmal atrial fibrillation: Secondary | ICD-10-CM | POA: Diagnosis not present

## 2020-08-15 DIAGNOSIS — Z87440 Personal history of urinary (tract) infections: Secondary | ICD-10-CM | POA: Diagnosis not present

## 2020-08-15 DIAGNOSIS — J449 Chronic obstructive pulmonary disease, unspecified: Secondary | ICD-10-CM | POA: Diagnosis not present

## 2020-08-15 DIAGNOSIS — I5032 Chronic diastolic (congestive) heart failure: Secondary | ICD-10-CM | POA: Diagnosis not present

## 2020-08-15 DIAGNOSIS — K5900 Constipation, unspecified: Secondary | ICD-10-CM | POA: Diagnosis not present

## 2020-08-16 DIAGNOSIS — R296 Repeated falls: Secondary | ICD-10-CM | POA: Diagnosis not present

## 2020-08-16 DIAGNOSIS — Z7984 Long term (current) use of oral hypoglycemic drugs: Secondary | ICD-10-CM | POA: Diagnosis not present

## 2020-08-16 DIAGNOSIS — Z8614 Personal history of Methicillin resistant Staphylococcus aureus infection: Secondary | ICD-10-CM | POA: Diagnosis not present

## 2020-08-16 DIAGNOSIS — K5900 Constipation, unspecified: Secondary | ICD-10-CM | POA: Diagnosis not present

## 2020-08-16 DIAGNOSIS — E871 Hypo-osmolality and hyponatremia: Secondary | ICD-10-CM | POA: Diagnosis not present

## 2020-08-16 DIAGNOSIS — I480 Paroxysmal atrial fibrillation: Secondary | ICD-10-CM | POA: Diagnosis not present

## 2020-08-16 DIAGNOSIS — G890 Central pain syndrome: Secondary | ICD-10-CM | POA: Diagnosis not present

## 2020-08-16 DIAGNOSIS — I5032 Chronic diastolic (congestive) heart failure: Secondary | ICD-10-CM | POA: Diagnosis not present

## 2020-08-16 DIAGNOSIS — I2510 Atherosclerotic heart disease of native coronary artery without angina pectoris: Secondary | ICD-10-CM | POA: Diagnosis not present

## 2020-08-16 DIAGNOSIS — G8929 Other chronic pain: Secondary | ICD-10-CM | POA: Diagnosis not present

## 2020-08-16 DIAGNOSIS — Z7901 Long term (current) use of anticoagulants: Secondary | ICD-10-CM | POA: Diagnosis not present

## 2020-08-16 DIAGNOSIS — J449 Chronic obstructive pulmonary disease, unspecified: Secondary | ICD-10-CM | POA: Diagnosis not present

## 2020-08-16 DIAGNOSIS — M810 Age-related osteoporosis without current pathological fracture: Secondary | ICD-10-CM | POA: Diagnosis not present

## 2020-08-16 DIAGNOSIS — E039 Hypothyroidism, unspecified: Secondary | ICD-10-CM | POA: Diagnosis not present

## 2020-08-16 DIAGNOSIS — Z87440 Personal history of urinary (tract) infections: Secondary | ICD-10-CM | POA: Diagnosis not present

## 2020-08-16 DIAGNOSIS — M545 Low back pain: Secondary | ICD-10-CM | POA: Diagnosis not present

## 2020-08-16 DIAGNOSIS — Z87891 Personal history of nicotine dependence: Secondary | ICD-10-CM | POA: Diagnosis not present

## 2020-08-16 DIAGNOSIS — K219 Gastro-esophageal reflux disease without esophagitis: Secondary | ICD-10-CM | POA: Diagnosis not present

## 2020-08-16 DIAGNOSIS — I700 Atherosclerosis of aorta: Secondary | ICD-10-CM | POA: Diagnosis not present

## 2020-08-16 DIAGNOSIS — E785 Hyperlipidemia, unspecified: Secondary | ICD-10-CM | POA: Diagnosis not present

## 2020-08-16 DIAGNOSIS — I110 Hypertensive heart disease with heart failure: Secondary | ICD-10-CM | POA: Diagnosis not present

## 2020-08-18 ENCOUNTER — Other Ambulatory Visit: Payer: Medicare Other

## 2020-08-18 ENCOUNTER — Ambulatory Visit (INDEPENDENT_AMBULATORY_CARE_PROVIDER_SITE_OTHER): Payer: Medicare Other | Admitting: Family Medicine

## 2020-08-18 ENCOUNTER — Encounter: Payer: Self-pay | Admitting: Family Medicine

## 2020-08-18 ENCOUNTER — Other Ambulatory Visit: Payer: Self-pay

## 2020-08-18 ENCOUNTER — Other Ambulatory Visit: Payer: Self-pay | Admitting: Family Medicine

## 2020-08-18 DIAGNOSIS — R399 Unspecified symptoms and signs involving the genitourinary system: Secondary | ICD-10-CM | POA: Diagnosis not present

## 2020-08-18 LAB — URINALYSIS, COMPLETE
Bilirubin, UA: NEGATIVE
Glucose, UA: NEGATIVE
Ketones, UA: NEGATIVE
Nitrite, UA: NEGATIVE
Protein,UA: NEGATIVE
RBC, UA: NEGATIVE
Specific Gravity, UA: 1.01 (ref 1.005–1.030)
Urobilinogen, Ur: 0.2 mg/dL (ref 0.2–1.0)
pH, UA: 7 (ref 5.0–7.5)

## 2020-08-18 LAB — MICROSCOPIC EXAMINATION
Bacteria, UA: NONE SEEN
Epithelial Cells (non renal): NONE SEEN /hpf (ref 0–10)
RBC, Urine: NONE SEEN /hpf (ref 0–2)

## 2020-08-18 MED ORDER — CEFDINIR 300 MG PO CAPS: 300.0000 mg | ORAL_CAPSULE | Freq: Two times a day (BID) | ORAL | 0 refills | Status: AC

## 2020-08-18 NOTE — Progress Notes (Signed)
Virtual Visit via Telephone Note  I connected with [name] on 08/18/20 at 1:42 PM by telephone and verified that I am speaking with the correct person using two identifiers. [name] is currently located at home and nobody is currently with her during this visit. The provider, [name] Brooklyn, FNP is located in their office at time of visit.  I discussed the limitations, risks, security and privacy concerns of performing an evaluation and management service by telephone and the availability of in person appointments. I also discussed with the patient that there may be a patient responsible charge related to this service. The patient expressed understanding and agreed to proceed.  Subjective: PCP: [name] Norlander, DO  Chief Complaint  Patient presents with   Urinary Tract Infection   Urinary Tract Infection: Patient complains of dysuria and urgency. She has had symptoms for 3 days. Patient also complains of back pain. Patient denies fever. Patient does have a history of recurrent UTI.  Patient does not have a history of pyelonephritis.    ROS: Per HPI  Current Outpatient Medications:    albuterol (VENTOLIN HFA) 108 (90 Base) MCG/ACT inhaler, Inhale 2 puffs into the lungs every 4 (four) hours as needed for wheezing or shortness of breath., Disp: 6.7 g, Rfl: 0   ALPRAZolam (XANAX) 0.5 MG tablet, Take 1-2 tablets (0.5-1 mg total) by mouth at bedtime as needed for anxiety., Disp: 45 tablet, Rfl: 2   Ascorbic Acid (VITAMIN C) 1000 MG tablet, Take 1,000 mg by mouth daily., Disp: , Rfl:    beta carotene 25000 UNIT capsule, Take 25,000 Units by mouth daily., Disp: , Rfl:    budesonide (PULMICORT) 0.5 MG/2ML nebulizer solution, Take 2 mLs (0.5 mg total) by nebulization 2 (two) times daily., Disp: 120 mL, Rfl: 1   Coenzyme Q10 10 MG capsule, Take 10 mg by mouth daily., Disp: , Rfl:    cyclobenzaprine (FLEXERIL) 10 MG tablet, Take 10 mg by mouth 3 (three) times daily as  needed., Disp: , Rfl:    denosumab (PROLIA) 60 MG/ML SOSY injection, Inject 60 mg every 6 (six) months into the skin. Administer in upper arm, thigh, or abdomen, Disp: 1 mL, Rfl: 0   dicyclomine (BENTYL) 10 MG capsule, TAKE 1 CAPSULE FOUR TIMES DAILY AS NEEDED FOR SEVERE PAIN, Disp: 30 capsule, Rfl: 0   diltiazem (CARDIZEM CD) 120 MG 24 hr capsule, Take 1 capsule (120 mg total) by mouth daily., Disp: 90 capsule, Rfl: 0   fluticasone (FLONASE) 50 MCG/ACT nasal spray, Place 2 sprays into both nostrils daily., Disp: , Rfl:    levothyroxine (SYNTHROID) 25 MCG tablet, Take 2 tablets (50 mcg total) by mouth daily before breakfast., Disp: 60 tablet, Rfl: 5   lidocaine (LIDODERM) 5 %, Place 1 patch onto the skin daily. Remove & Discard patch within 12 hours or as directed by MD, Disp: 30 patch, Rfl: 12   LINZESS 145 MCG CAPS capsule, TAKE 1 CAPSULE DAILY TO REGULATE BOWEL MOVEMENTS, Disp: 30 capsule, Rfl: 2   loratadine (CLARITIN) 10 MG tablet, Take 1 tablet (10 mg total) by mouth daily., Disp: 90 tablet, Rfl: 1   meclizine (ANTIVERT) 25 MG tablet, Take 1 tablet (25 mg total) by mouth 3 (three) times daily as needed for dizziness., Disp: 30 tablet, Rfl: 0   metoprolol tartrate (LOPRESSOR) 25 MG tablet, TAKE 2 TABLETS IN THE MORNING, AND 1 TABLET IN THE EVENING, Disp: 270 tablet, Rfl: 3   montelukast (SINGULAIR) 10 MG  tablet, TAKE 1 TABLET ONCE A DAY, Disp: 90 tablet, Rfl: 1   Multiple Vitamin (MULTIVITAMIN) tablet, Take 1 tablet by mouth daily., Disp: , Rfl:    nitroGLYCERIN (NITROSTAT) 0.4 MG SL tablet, Place 1 tablet (0.4 mg total) under the tongue every 5 (five) minutes as needed for chest pain., Disp: 30 tablet, Rfl: 0   omega-3 acid ethyl esters (LOVAZA) 1 g capsule, Take 2 capsules (2 g total) by mouth 2 (two) times daily., Disp: 120 capsule, Rfl: 0   omeprazole-sodium bicarbonate (ZEGERID) 40-1100 MG capsule, TAKE (1) CAPSULE DAILY, Disp: 30 capsule, Rfl: 5   ondansetron (ZOFRAN) 4 MG  tablet, TAKE 1 TABLET EVERY 6 HOURS AS NEEDED FOR NAUSEA & VOMITING, Disp: 20 tablet, Rfl: 0   polyethylene glycol (MIRALAX / GLYCOLAX) 17 g packet, Take 17 g by mouth daily., Disp: , Rfl:    rosuvastatin (CRESTOR) 20 MG tablet, TAKE 1 TABLET ONCE DAILY, Disp: 90 tablet, Rfl: 0   sennosides-docusate sodium (SENOKOT-S) 8.6-50 MG tablet, Take 1 tablet by mouth at bedtime., Disp: , Rfl:    sertraline (ZOLOFT) 100 MG tablet, Take 1 tablet (100 mg total) by mouth daily., Disp: 90 tablet, Rfl: 3   tamsulosin (FLOMAX) 0.4 MG CAPS capsule, TAKE (1) CAPSULE DAILY, Disp: 90 capsule, Rfl: 0   XARELTO 20 MG TABS tablet, TAKE 1 TABLET ONCE DAILY, Disp: 90 tablet, Rfl: 0  Allergies  Allergen Reactions   Naproxen Other (See Comments)    Tongue swelling   Penicillins Swelling    Swelling around site Has patient had a PCN reaction causing immediate rash, facial/tongue/throat swelling, SOB or lightheadedness with hypotension: Yes Has patient had a PCN reaction causing severe rash involving mucus membranes or skin necrosis: No Has patient had a PCN reaction that required hospitalization: No Has patient had a PCN reaction occurring within the last 10 years: No If all of the above answers are "NO", then may proceed with Cephalosporin use.    Sulfonamide Derivatives Hives   Aspirin Other (See Comments)    REACTION: regular strength causes "heart to beat fast"   Captopril Hypertension   Strawberry Extract Other (See Comments)   Clindamycin/Lincomycin Other (See Comments)    unknown   Past Medical History:  Diagnosis Date   Anxiety    Arthritis    Asthmatic bronchitis    Atrial fibrillation (HCC)    Bowel obstruction (HCC)    blockage   CAD (coronary artery disease)    Stent to RI 2003.  Myoview 2013 no ischemia.   Cataract    Chronic back pain    Chronic bronchitis (HCC)    Colon polyp    adenomatous   Congestive heart disease (HCC)    Preserved EF   Depression     Esophageal motility disorder    Fibromyalgia    GERD (gastroesophageal reflux disease)    History of hiatal hernia    History of kidney stones    Hyperlipidemia    Hypertension    Hypothyroid    Meningitis due to unspecified bacterium    history of spinal   Nephrolithiasis    OA (osteoarthritis)    Status post dilation of esophageal narrowing     Observations/Objective: A&O  No respiratory distress or wheezing audible over the phone Mood, judgement, and thought processes all WNL  Assessment and Plan: 1. UTI symptoms - Education provided on UTIs. Encouraged adequate hydration. Urine culture pending.  - Urine Culture - Urinalysis, Complete - cefdinir (OMNICEF) 300 MG capsule;  Take 1 capsule (300 mg total) by mouth 2 (two) times daily for 10 days.  Dispense: 20 capsule; Refill: 0   Follow Up Instructions:  I discussed the assessment and treatment plan with the patient. The patient was provided an opportunity to ask questions and all were answered. The patient agreed with the plan and demonstrated an understanding of the instructions.   The patient was advised to call back or seek an in-person evaluation if the symptoms worsen or if the condition fails to improve as anticipated.  The above assessment and management plan was discussed with the patient. The patient verbalized understanding of and has agreed to the management plan. Patient is aware to call the clinic if symptoms persist or worsen. Patient is aware when to return to the clinic for a follow-up visit. Patient educated on when it is appropriate to go to the emergency department.   Time call ended: 1:45 PM  I provided 5 minutes of non-face-to-face time during this encounter.  Hendricks Limes, MSN, APRN, FNP-C [facility] 08/18/20

## 2020-08-24 DIAGNOSIS — R5381 Other malaise: Secondary | ICD-10-CM | POA: Diagnosis not present

## 2020-08-24 DIAGNOSIS — W19XXXA Unspecified fall, initial encounter: Secondary | ICD-10-CM | POA: Diagnosis not present

## 2020-08-24 LAB — URINE CULTURE

## 2020-08-28 ENCOUNTER — Other Ambulatory Visit: Payer: Self-pay | Admitting: Cardiology

## 2020-08-29 ENCOUNTER — Telehealth: Payer: Self-pay | Admitting: *Deleted

## 2020-08-29 DIAGNOSIS — J449 Chronic obstructive pulmonary disease, unspecified: Secondary | ICD-10-CM | POA: Diagnosis not present

## 2020-08-29 DIAGNOSIS — G8929 Other chronic pain: Secondary | ICD-10-CM | POA: Diagnosis not present

## 2020-08-29 DIAGNOSIS — M545 Low back pain: Secondary | ICD-10-CM | POA: Diagnosis not present

## 2020-08-29 DIAGNOSIS — K219 Gastro-esophageal reflux disease without esophagitis: Secondary | ICD-10-CM | POA: Diagnosis not present

## 2020-08-29 DIAGNOSIS — M810 Age-related osteoporosis without current pathological fracture: Secondary | ICD-10-CM | POA: Diagnosis not present

## 2020-08-29 DIAGNOSIS — I700 Atherosclerosis of aorta: Secondary | ICD-10-CM | POA: Diagnosis not present

## 2020-08-29 DIAGNOSIS — I110 Hypertensive heart disease with heart failure: Secondary | ICD-10-CM | POA: Diagnosis not present

## 2020-08-29 DIAGNOSIS — I2510 Atherosclerotic heart disease of native coronary artery without angina pectoris: Secondary | ICD-10-CM | POA: Diagnosis not present

## 2020-08-29 DIAGNOSIS — E039 Hypothyroidism, unspecified: Secondary | ICD-10-CM | POA: Diagnosis not present

## 2020-08-29 DIAGNOSIS — I480 Paroxysmal atrial fibrillation: Secondary | ICD-10-CM | POA: Diagnosis not present

## 2020-08-29 DIAGNOSIS — Z7901 Long term (current) use of anticoagulants: Secondary | ICD-10-CM | POA: Diagnosis not present

## 2020-08-29 DIAGNOSIS — R296 Repeated falls: Secondary | ICD-10-CM | POA: Diagnosis not present

## 2020-08-29 DIAGNOSIS — E871 Hypo-osmolality and hyponatremia: Secondary | ICD-10-CM | POA: Diagnosis not present

## 2020-08-29 DIAGNOSIS — C8590 Non-Hodgkin lymphoma, unspecified, unspecified site: Secondary | ICD-10-CM

## 2020-08-29 DIAGNOSIS — E785 Hyperlipidemia, unspecified: Secondary | ICD-10-CM | POA: Diagnosis not present

## 2020-08-29 DIAGNOSIS — Z8614 Personal history of Methicillin resistant Staphylococcus aureus infection: Secondary | ICD-10-CM | POA: Diagnosis not present

## 2020-08-29 DIAGNOSIS — Z7984 Long term (current) use of oral hypoglycemic drugs: Secondary | ICD-10-CM | POA: Diagnosis not present

## 2020-08-29 DIAGNOSIS — Z87440 Personal history of urinary (tract) infections: Secondary | ICD-10-CM | POA: Diagnosis not present

## 2020-08-29 DIAGNOSIS — I5032 Chronic diastolic (congestive) heart failure: Secondary | ICD-10-CM | POA: Diagnosis not present

## 2020-08-29 DIAGNOSIS — K5900 Constipation, unspecified: Secondary | ICD-10-CM | POA: Diagnosis not present

## 2020-08-29 DIAGNOSIS — Z87891 Personal history of nicotine dependence: Secondary | ICD-10-CM | POA: Diagnosis not present

## 2020-08-29 DIAGNOSIS — G890 Central pain syndrome: Secondary | ICD-10-CM | POA: Diagnosis not present

## 2020-08-29 NOTE — Telephone Encounter (Signed)
Community message sent to [facility] w/ [facility]

## 2020-08-29 NOTE — Telephone Encounter (Signed)
I do not know the patient personally but I do not think that Dr. Lajuana Ripple would be opposed to a hospice referral and have hospice evaluate if she is a candidate.  Go ahead and place referral Caryl Pina, MD [facility] 08/29/2020, 1:16 PM

## 2020-08-29 NOTE — Telephone Encounter (Signed)
Referral placed.

## 2020-08-29 NOTE — Telephone Encounter (Signed)
TC w/ Cecille Rubin from [facility] She is seeing pt today, c/o increased pain & weakness, second time pt has talked about Hospice Can we place a referral to Hospice

## 2020-08-29 NOTE — Telephone Encounter (Signed)
Low-grade malignant lymphoma as the diagnosis is a all that I can find

## 2020-08-29 NOTE — Telephone Encounter (Signed)
What Dx can we use

## 2020-08-31 ENCOUNTER — Other Ambulatory Visit: Payer: Self-pay

## 2020-08-31 ENCOUNTER — Emergency Department (HOSPITAL_COMMUNITY): Payer: Medicare Other

## 2020-08-31 ENCOUNTER — Emergency Department (HOSPITAL_COMMUNITY)
Admission: EM | Admit: 2020-08-31 | Discharge: 2020-09-02 | Disposition: A | Discharge status: Home / Self Care | Payer: Medicare Other | Attending: Emergency Medicine | Admitting: Emergency Medicine

## 2020-08-31 ENCOUNTER — Encounter (HOSPITAL_COMMUNITY): Payer: Self-pay

## 2020-08-31 DIAGNOSIS — Z79899 Other long term (current) drug therapy: Secondary | ICD-10-CM | POA: Insufficient documentation

## 2020-08-31 DIAGNOSIS — E039 Hypothyroidism, unspecified: Secondary | ICD-10-CM | POA: Diagnosis not present

## 2020-08-31 DIAGNOSIS — K8020 Calculus of gallbladder without cholecystitis without obstruction: Secondary | ICD-10-CM | POA: Diagnosis not present

## 2020-08-31 DIAGNOSIS — J449 Chronic obstructive pulmonary disease, unspecified: Secondary | ICD-10-CM | POA: Diagnosis not present

## 2020-08-31 DIAGNOSIS — Z20822 Contact with and (suspected) exposure to covid-19: Secondary | ICD-10-CM | POA: Diagnosis not present

## 2020-08-31 DIAGNOSIS — I700 Atherosclerosis of aorta: Secondary | ICD-10-CM | POA: Diagnosis not present

## 2020-08-31 DIAGNOSIS — Z96651 Presence of right artificial knee joint: Secondary | ICD-10-CM | POA: Diagnosis not present

## 2020-08-31 DIAGNOSIS — Z7951 Long term (current) use of inhaled steroids: Secondary | ICD-10-CM | POA: Diagnosis not present

## 2020-08-31 DIAGNOSIS — R531 Weakness: Secondary | ICD-10-CM | POA: Diagnosis not present

## 2020-08-31 DIAGNOSIS — Z7901 Long term (current) use of anticoagulants: Secondary | ICD-10-CM | POA: Insufficient documentation

## 2020-08-31 DIAGNOSIS — I2510 Atherosclerotic heart disease of native coronary artery without angina pectoris: Secondary | ICD-10-CM | POA: Diagnosis not present

## 2020-08-31 DIAGNOSIS — I10 Essential (primary) hypertension: Secondary | ICD-10-CM | POA: Diagnosis not present

## 2020-08-31 DIAGNOSIS — Z87891 Personal history of nicotine dependence: Secondary | ICD-10-CM | POA: Insufficient documentation

## 2020-08-31 DIAGNOSIS — I110 Hypertensive heart disease with heart failure: Secondary | ICD-10-CM | POA: Insufficient documentation

## 2020-08-31 DIAGNOSIS — C8590 Non-Hodgkin lymphoma, unspecified, unspecified site: Secondary | ICD-10-CM | POA: Diagnosis not present

## 2020-08-31 DIAGNOSIS — E871 Hypo-osmolality and hyponatremia: Secondary | ICD-10-CM | POA: Diagnosis not present

## 2020-08-31 DIAGNOSIS — I5032 Chronic diastolic (congestive) heart failure: Secondary | ICD-10-CM | POA: Insufficient documentation

## 2020-08-31 DIAGNOSIS — C8598 Non-Hodgkin lymphoma, unspecified, lymph nodes of multiple sites: Secondary | ICD-10-CM | POA: Insufficient documentation

## 2020-08-31 LAB — COMPREHENSIVE METABOLIC PANEL
ALT: 17 U/L (ref 0–44)
AST: 29 U/L (ref 15–41)
Albumin: 2.8 g/dL — ABNORMAL LOW (ref 3.5–5.0)
Alkaline Phosphatase: 52 U/L (ref 38–126)
Anion gap: 5 (ref 5–15)
BUN: 11 mg/dL (ref 8–23)
CO2: 23 mmol/L (ref 22–32)
Calcium: 7.9 mg/dL — ABNORMAL LOW (ref 8.9–10.3)
Chloride: 102 mmol/L (ref 98–111)
Creatinine, Ser: 0.73 mg/dL (ref 0.44–1.00)
GFR calc Af Amer: >60 mL/min (ref >60)
GFR calc non Af Amer: >60 mL/min (ref >60)
Glucose, Bld: 101 mg/dL — ABNORMAL HIGH (ref 70–99)
Potassium: 4 mmol/L (ref 3.5–5.1)
Sodium: 130 mmol/L — ABNORMAL LOW (ref 135–145)
Total Bilirubin: 0.5 mg/dL (ref 0.3–1.2)
Total Protein: 10.3 g/dL — ABNORMAL HIGH (ref 6.5–8.1)

## 2020-08-31 LAB — URINALYSIS, ROUTINE W REFLEX MICROSCOPIC
Bilirubin Urine: NEGATIVE
Glucose, UA: NEGATIVE mg/dL
Hgb urine dipstick: NEGATIVE
Ketones, ur: NEGATIVE mg/dL
Leukocytes,Ua: NEGATIVE
Nitrite: NEGATIVE
Protein, ur: NEGATIVE mg/dL
Specific Gravity, Urine: 1.023 (ref 1.005–1.030)
pH: 7 (ref 5.0–8.0)

## 2020-08-31 LAB — CBC WITH DIFFERENTIAL/PLATELET
Abs Immature Granulocytes: 0.01 10*3/uL (ref 0.00–0.07)
Basophils Absolute: 0 10*3/uL (ref 0.0–0.1)
Basophils Relative: 1 %
Eosinophils Absolute: 0.1 10*3/uL (ref 0.0–0.5)
Eosinophils Relative: 2 %
HCT: 36.7 % (ref 36.0–46.0)
Hemoglobin: 11.3 g/dL — ABNORMAL LOW (ref 12.0–15.0)
Immature Granulocytes: 0 %
Lymphocytes Relative: 61 %
Lymphs Abs: 4.4 10*3/uL — ABNORMAL HIGH (ref 0.7–4.0)
MCH: 30.5 pg (ref 26.0–34.0)
MCHC: 30.8 g/dL (ref 30.0–36.0)
MCV: 99.2 fL (ref 80.0–100.0)
Monocytes Absolute: 0.6 10*3/uL (ref 0.1–1.0)
Monocytes Relative: 9 %
Neutro Abs: 2 10*3/uL (ref 1.7–7.7)
Neutrophils Relative %: 27 %
Platelets: 173 10*3/uL (ref 150–400)
RBC: 3.7 MIL/uL — ABNORMAL LOW (ref 3.87–5.11)
RDW: 13.3 % (ref 11.5–15.5)
WBC: 7.2 10*3/uL (ref 4.0–10.5)
nRBC: 0 % (ref 0.0–0.2)

## 2020-08-31 LAB — TROPONIN I (HIGH SENSITIVITY)
Troponin I (High Sensitivity): 4 ng/L (ref <18)
Troponin I (High Sensitivity): 5 ng/L (ref <18)

## 2020-08-31 LAB — SARS CORONAVIRUS 2 BY RT PCR (HOSPITAL ORDER, PERFORMED IN ~~LOC~~ HOSPITAL LAB): SARS Coronavirus 2: NEGATIVE

## 2020-08-31 MED ORDER — METOPROLOL TARTRATE 25 MG PO TABS
25.0000 mg | ORAL_TABLET | Freq: Two times a day (BID) | ORAL | Status: DC
  Administered 2020-08-31 – 2020-09-02 (×4): 25 mg via ORAL
  Filled 2020-08-31 (×4): qty 1

## 2020-08-31 MED ORDER — ALBUTEROL SULFATE HFA 108 (90 BASE) MCG/ACT IN AERS
2.0000 | INHALATION_SPRAY | RESPIRATORY_TRACT | Status: DC | PRN
  Administered 2020-09-01: 2 via RESPIRATORY_TRACT
  Filled 2020-08-31: qty 6.7

## 2020-08-31 MED ORDER — IOHEXOL 300 MG/ML  SOLN
75.0000 mL | Freq: Once | INTRAMUSCULAR | Status: AC | PRN
  Administered 2020-08-31: 75 mL via INTRAVENOUS

## 2020-08-31 MED ORDER — RIVAROXABAN 15 MG PO TABS: 15.0000 mg | ORAL_TABLET | Freq: Every day | ORAL | Status: DC

## 2020-08-31 MED ORDER — SERTRALINE HCL 50 MG PO TABS
100.0000 mg | ORAL_TABLET | Freq: Every day | ORAL | Status: DC
  Administered 2020-08-31 – 2020-09-02 (×3): 100 mg via ORAL
  Filled 2020-08-31 (×3): qty 2

## 2020-08-31 MED ORDER — ROSUVASTATIN CALCIUM 20 MG PO TABS
20.0000 mg | ORAL_TABLET | Freq: Every day | ORAL | Status: DC
  Administered 2020-09-01 – 2020-09-02 (×2): 20 mg via ORAL
  Filled 2020-08-31 (×5): qty 1

## 2020-08-31 MED ORDER — OMEGA-3-ACID ETHYL ESTERS 1 G PO CAPS
2.0000 g | ORAL_CAPSULE | Freq: Two times a day (BID) | ORAL | Status: DC
  Administered 2020-09-01 – 2020-09-02 (×3): 2 g via ORAL
  Filled 2020-08-31 (×8): qty 2

## 2020-08-31 MED ORDER — LINACLOTIDE 145 MCG PO CAPS
145.0000 ug | ORAL_CAPSULE | Freq: Every day | ORAL | Status: DC
  Administered 2020-09-01 – 2020-09-02 (×2): 145 ug via ORAL
  Filled 2020-08-31 (×4): qty 1

## 2020-08-31 MED ORDER — DICYCLOMINE HCL 10 MG PO CAPS: 10.0000 mg | ORAL_CAPSULE | Freq: Three times a day (TID) | ORAL | Status: DC | PRN

## 2020-08-31 MED ORDER — LEVOTHYROXINE SODIUM 50 MCG PO TABS
50.0000 ug | ORAL_TABLET | Freq: Every day | ORAL | Status: DC
  Administered 2020-09-01 – 2020-09-02 (×2): 50 ug via ORAL
  Filled 2020-08-31 (×2): qty 1

## 2020-08-31 MED ORDER — ASCORBIC ACID 500 MG PO TABS
1000.0000 mg | ORAL_TABLET | Freq: Every day | ORAL | Status: DC
  Administered 2020-08-31 – 2020-09-02 (×3): 1000 mg via ORAL
  Filled 2020-08-31 (×3): qty 2

## 2020-08-31 MED ORDER — SODIUM CHLORIDE 0.9 % IV BOLUS
500.0000 mL | Freq: Once | INTRAVENOUS | Status: AC
  Administered 2020-08-31: 500 mL via INTRAVENOUS

## 2020-08-31 MED ORDER — LORAZEPAM 1 MG PO TABS
1.0000 mg | ORAL_TABLET | Freq: Once | ORAL | Status: AC
  Administered 2020-08-31: 1 mg via ORAL
  Filled 2020-08-31: qty 1

## 2020-08-31 MED ORDER — RIVAROXABAN 15 MG PO TABS
15.0000 mg | ORAL_TABLET | Freq: Every day | ORAL | Status: DC
  Administered 2020-08-31 – 2020-09-01 (×2): 15 mg via ORAL
  Filled 2020-08-31 (×2): qty 1

## 2020-08-31 MED ORDER — ALPRAZOLAM 0.5 MG PO TABS
0.5000 mg | ORAL_TABLET | Freq: Every evening | ORAL | Status: DC | PRN
  Administered 2020-08-31: 1 mg via ORAL
  Filled 2020-08-31: qty 2

## 2020-08-31 MED ORDER — RIVAROXABAN 20 MG PO TABS: 20.0000 mg | ORAL_TABLET | Freq: Every day | ORAL | Status: DC

## 2020-08-31 MED ORDER — CYCLOBENZAPRINE HCL 10 MG PO TABS
10.0000 mg | ORAL_TABLET | Freq: Three times a day (TID) | ORAL | Status: DC | PRN
  Administered 2020-08-31 – 2020-09-01 (×3): 10 mg via ORAL
  Filled 2020-08-31 (×3): qty 1

## 2020-08-31 NOTE — TOC Initial Note (Signed)
Transition of Care [facility]) - Initial/Assessment Note   Patient Details  Name: [name] MRN: [id number] Date of Birth: October 18, 1936  Transition [facility]) CM/SW Contact:    Sherie Don, LCSW Phone Number: 08/31/2020, 4:53 PM  Clinical Narrative: Patient is an 84 year old female who presented to the ED for progressive weakness and falls at home. TOC received consult to discuss placement/care options with patient's son, Malka So. CSW spoke with son. Per son, patient was recently diagnosed with cancer and they met with hospice earlier this week. Son reported patient wants to live independently, but due to safety concerns son wanted to discuss placement options. Patient does not have Medicaid, so she is not eligible for LTC and son reported they cannot afford private pay for an ALF. CSW discussed SNF with son and son agreeable to discussing SNF as an option with the patient. Son stated they may also consider hospice. TOC to follow if patient agreeable to SNF placement for rehab; will need PT consult placed.  Expected Discharge Plan: Skilled Nursing Facility Barriers to Discharge: Other (comment) (Patient has cancer and family cannot afford ALF/LTC private pay)  Patient Goals and CMS Choice Patient states their goals for this hospitalization and ongoing recovery are:: Discharge home/get stronger CMS Medicare.gov Compare Post Acute Care list provided to:: Patient Represenative (must comment) Malka So (son)) Choice offered to / list presented to : Adult Children  Expected Discharge Plan and Services Expected Discharge Plan: [place] In-house Referral: Clinical Social Work Discharge Planning Services: NA Living arrangements for the past 2 months: Apartment  Prior Living Arrangements/Services Living arrangements for the past 2 months: Apartment Lives with:: Self Patient language and need for interpreter reviewed:: Yes Do you feel safe going back to the place  where you live?: Yes      Need for Family Participation in Patient Care: Yes (Comment) Care giver support system in place?: Yes (comment) Criminal Activity/Legal Involvement Pertinent to Current Situation/Hospitalization: No - Comment as needed  Permission Sought/Granted Permission sought to share information with : Family Supports  Emotional Assessment Appearance:: Appears stated age Alcohol / Substance Use: Not Applicable Psych Involvement: No (comment)  Admission diagnosis:  EMS Patient Active Problem List   Diagnosis Date Noted  . Educated about COVID-19 virus infection 04/04/2020  . Low grade malignant lymphoma ([place]) 03/08/2020  . Chronic diastolic (congestive) heart failure ([place]) 07/23/2019  . Chronic constipation 07/23/2019  . Urine retention 07/23/2019  . Arachnoid cyst of spine 07/23/2019  . Chronic hyponatremia 07/23/2019  . Palpitations 04/02/2019  . Snoring 02/19/2018  . Other fatigue 02/19/2018  . Chronic pain syndrome 01/20/2018  . Chronic low back pain 01/20/2018  . Chronic pain 07/02/2017  . Vocal cord dysfunction 02/04/2017  . Irritable bowel syndrome with diarrhea 12/11/2015  . UTI (urinary tract infection) 08/22/2015  . Cough 07/26/2015  . Diarrhea 11/30/2014  . Heme + stool 11/30/2014  . Chronic anticoagulation 11/30/2014  . Hypertensive heart disease with chronic diastolic congestive heart failure ([place]) 04/30/2014  . 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 Osteoporosis 01/27/2014  . Fibromyalgia 08/09/2013  . COPD with asthma ([place]) 08/01/2010  . Acquired hypothyroidism 03/20/2010  . Major depression, chronic 03/20/2010  . ESOPHAGEAL MOTILITY DISORDER 03/20/2010  . Hyperlipidemia 04/22/2009  . Coronary atherosclerosis 04/22/2009  . ATRIAL FIBRILLATION, PAROXYSMAL 04/22/2009  . ESOPHAGEAL STRICTURE 10/31/2004  . GERD 10/31/2004  . HIATAL HERNIA 10/06/2001   PCP:  Janora Norlander, DO Pharmacy:   [facility] -  [place], [address] [place] [address] Phone: [phone] Fax: [phone]  [facility] - [place], [place] - [address] [place] [address] Phone: [phone] Fax: [phone]  [place], [place] [address] Phone: [phone] Fax: [phone]  Readmission Risk Interventions No flowsheet data found.

## 2020-08-31 NOTE — ED Provider Notes (Signed)
[facility] EMERGENCY DEPARTMENT Provider Note   CSN: [id number] Arrival date & time: 08/31/20  1155     History Chief Complaint  Patient presents with  . Weakness    [name] is a 84 y.o. female with past medical history of chronic low back pain, impaired mobility and ADLs, paroxysmal atrial fibrillation on Xarelto, generalized anxiety disorder on Xanax, acquired hypothyroidism, HTN, HLD, and CAD who presents to the ED via EMS for progressively worsening leg weakness in the setting of recently diagnosed cancer.   On my examination, patient states that she has recently been followed by Dr. Jolayne Haines at [facility] for [place].  She was supposed to obtain CT imaging today, but she was too weak to go.  Patient reports that she got up to go the bathroom on the commode this morning at 3 AM and on returning to her bed, her legs felt like "rubber" and she had to slump down to the floor in a controlled fall due to concerns for instability.  She states that she has had multiple episodes in the past few weeks where she has had to guide herself down to the floor in a controlled fashion due to feeling weak.  Ambulates with Rollator at baseline.  She is also endorsing mid and low back pain, which she admits is chronic.  She states that she had a meningioma in the past.  She lives alone in an apartment, but states that she has been independent in all of her life and she does not want that to change.  At the same time, she is frustrated that she cannot drive around anywhere and does not like being home alone.  In addition to her weakness, particular with exertion, she is also endorsing generalized pain complaints.  She states that her shoulders, back, and ankles are all "achy".  Patient also endorses increased urinary frequency.  She was recently treated by PCP with [facility] for UTI earlier this month.  Patient denies any chest pain, shortness of breath, fevers or chills, cough, new or worsening abdominal pain,  nausea or vomiting, or other symptoms.  Her most recent bowel movement was 3 days ago, otherwise normal.  She endorses chronic constipation.  I reviewed patient's medical record and she recently had telephone encounter on 08/29/2020 with office of her oncologist.  She was noting the same profound weakness that she is endorsing here in the ED today with lack of strength in her lower extremities.  Patient has been treated with pain patches and muscle relaxants, without effect.  She was complaining of weakness rather than pain.     She had a new patient appointment with Dr. Britt Bottom, neurosurgery, referred there by her PCP Dr. Lajuana Ripple, who notes patient's hx of thoracic syrinx s/p thoracic decompression and prior T11 compression fracture. Patient had spinal cord stimulator placed in 2018, failed attempt at complete removal in 01/2019 after it failed to alleviate her discomfort.  Most recent surgical intervention was T7 and T9 laminectomy 06/2019 for arachnoid cyst.  HPI     Past Medical History:  Diagnosis Date  . Anxiety   . Arthritis   . Asthmatic bronchitis   . Atrial fibrillation ([place])   . Bowel obstruction (HCC)    blockage  . CAD (coronary artery disease)    Stent to RI 2003.  Myoview 2013 no ischemia.  . Cataract   . Chronic back pain   . Chronic bronchitis ([place])   . Colon polyp    adenomatous  .  Congestive heart disease (HCC)    Preserved EF  . Depression   . Esophageal motility disorder   . Fibromyalgia   . GERD (gastroesophageal reflux disease)   . History of hiatal hernia   . History of kidney stones   . Hyperlipidemia   . Hypertension   . Hypothyroid   . 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 Nephrolithiasis   . OA (osteoarthritis)   . Status post dilation of esophageal narrowing     Patient Active Problem List   Diagnosis Date Noted  . Educated about COVID-19 virus infection 04/04/2020  . Low grade malignant lymphoma ([place])  03/08/2020  . Chronic diastolic (congestive) heart failure ([place]) 07/23/2019  . Chronic constipation 07/23/2019  . Urine retention 07/23/2019  . Arachnoid cyst of spine 07/23/2019  . Chronic hyponatremia 07/23/2019  . Palpitations 04/02/2019  . Snoring 02/19/2018  . Other fatigue 02/19/2018  . Chronic pain syndrome 01/20/2018  . Chronic low back pain 01/20/2018  . Chronic pain 07/02/2017  . Vocal cord dysfunction 02/04/2017  . Irritable bowel syndrome with diarrhea 12/11/2015  . UTI (urinary tract infection) 08/22/2015  . Cough 07/26/2015  . Diarrhea 11/30/2014  . Heme + stool 11/30/2014  . Chronic anticoagulation 11/30/2014  . Hypertensive heart disease with chronic diastolic congestive heart failure ([place]) 04/30/2014  . 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 Osteoporosis 01/27/2014  . Fibromyalgia 08/09/2013  . 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 Acquired hypothyroidism 03/20/2010  . Major depression, chronic 03/20/2010  . ESOPHAGEAL MOTILITY DISORDER 03/20/2010  . Hyperlipidemia 04/22/2009  . Coronary atherosclerosis 04/22/2009  . ATRIAL FIBRILLATION, PAROXYSMAL 04/22/2009  . ESOPHAGEAL STRICTURE 10/31/2004  . GERD 10/31/2004  . HIATAL HERNIA 10/06/2001    Past Surgical History:  Procedure Laterality Date  . ABDOMINAL HYSTERECTOMY     partial  . ANKLE FUSION  08/27/2012   Procedure: ARTHRODESIS ANKLE;  Surgeon: Wylene Simmer, MD;  Location: [place];  Service: Orthopedics;  Laterality: Right;  Arthrodesis right ankle and subtalar joint  . APPENDECTOMY    . ARTHRODESIS TIBIOFIBULAR Right 03/31/2014   Procedure: REVISION OF SUBTALOR ARTHRODESIS  RIGHT ;  Surgeon: Wylene Simmer, MD;  Location: [place];  Service: Orthopedics;  Laterality: Right;  . BACK SURGERY    . BREAST SURGERY     Left breast lump removed  . BRONCHOSCOPY    . CORONARY ANGIOPLASTY WITH STENT PLACEMENT  2003  . DECORTICATION Right 05/03/2014   Procedure: DECORTICATION;  Surgeon: [name], MD;   Location: [place];  Service: Thoracic;  Laterality: Right;  . EYE SURGERY  2016   cateracts  . FOOT ARTHRODESIS, SUBTALAR Right 2013  . HARDWARE REMOVAL Right 03/31/2014   Procedure: REMOVAL OF DEEP IMPLANTS X 3  RIGHT ;  Surgeon: Wylene Simmer, MD;  Location: [place];  Service: Orthopedics;  Laterality: Right;  . HARDWARE REMOVAL Right 11/03/2014   Procedure: HARDWARE REMOVAL OF SUBTALAR JOINT;  Surgeon: Wylene Simmer, MD;  Location: [place];  Service: Orthopedics;  Laterality: Right;  . HARDWARE REVISION  03/31/2014   SUBTALOR  ARTHRODESIS       DR HEWITT  . JOINT REPLACEMENT     Right knee  . KNEE ARTHROSCOPY  03/26/2012   Procedure: ARTHROSCOPY KNEE;  Surgeon: Wylene Simmer, MD;  Location: [place];  Service: Orthopedics;  Laterality: Left;  with Debridement of Lateral Meniscus tear  . REMOVAL OF IMPLANT Right 03/31/2014   DR HEWITT  .  ROTATOR CUFF REPAIR Bilateral   . SINUS SURGERY WITH INSTATRAK    . SPINAL CORD STIMULATOR INSERTION N/A 07/02/2017   Procedure: LUMBAR SPINAL CORD STIMULATOR INSERTION;  Surgeon: Melina Schools, MD;  Location: [place];  Service: Orthopedics;  Laterality: N/A;  120 mins  . TOTAL KNEE ARTHROPLASTY Right   . VIDEO ASSISTED THORACOSCOPY (VATS)/EMPYEMA Right 05/03/2014   Procedure: VIDEO ASSISTED THORACOSCOPY (VATS)/EMPYEMA;  Surgeon: [name], MD;  Location: [place];  Service: Thoracic;  Laterality: Right;  Marland Kitchen VIDEO BRONCHOSCOPY N/A 05/03/2014   Procedure: VIDEO BRONCHOSCOPY;  Surgeon: [name], MD;  Location: [facility] OR;  Service: Thoracic;  Laterality: N/A;     OB History   No obstetric history on file.     Family History  Problem Relation Age of Onset  . Prostate cancer Brother   . Cancer Brother        PROSTATE  . Heart disease Mother   . Asthma Mother   . Congestive Heart Failure Mother   . Emphysema Sister   . COPD Sister   . Stroke Sister   . Heart disease Sister   . Emphysema Brother   . Rheumatologic disease Neg Hx      Social History   Tobacco Use  . Smoking status: Former Smoker    Packs/day: 1.00    Years: 30.00    Pack years: 30.00    Types: Cigarettes    Quit date: 12/16/1989    Years since quitting: 30.7  . Smokeless tobacco: Never Used  . Tobacco comment: smoked off & on  Vaping Use  . Vaping Use: Never used  Substance Use Topics  . Alcohol use: No    Alcohol/week: 0.0 standard drinks  . Drug use: No    Home Medications Prior to Admission medications   Medication Sig Start Date End Date Taking? Authorizing Provider  albuterol (VENTOLIN HFA) 108 (90 Base) MCG/ACT inhaler Inhale 2 puffs into the lungs every 4 (four) hours as needed for wheezing or shortness of breath. 07/26/19   Gerlene Fee, NP  ALPRAZolam Duanne Moron) 0.5 MG tablet Take 1-2 tablets (0.5-1 mg total) by mouth at bedtime as needed for anxiety. 07/11/20   Janora Norlander, DO  Ascorbic Acid (VITAMIN C) 1000 MG tablet Take 1,000 mg by mouth daily.    [provider]  beta carotene 25000 UNIT capsule Take 25,000 Units by mouth daily.    [provider]  budesonide (PULMICORT) 0.5 MG/2ML nebulizer solution Take 2 mLs (0.5 mg total) by nebulization 2 (two) times daily. 09/01/19   Rigoberto Noel, MD  Coenzyme Q10 10 MG capsule Take 10 mg by mouth daily.    [provider]  cyclobenzaprine (FLEXERIL) 10 MG tablet Take 10 mg by mouth 3 (three) times daily as needed. 11/08/19   [provider]  denosumab (PROLIA) 60 MG/ML SOSY injection Inject 60 mg every 6 (six) months into the skin. Administer in upper arm, thigh, or abdomen 01/15/19   Chipper Herb, MD  dicyclomine (BENTYL) 10 MG capsule TAKE 1 CAPSULE FOUR TIMES DAILY AS NEEDED FOR SEVERE PAIN 08/18/20   Ronnie Doss M, DO  diltiazem (CARDIZEM CD) 120 MG 24 hr capsule Take 1 capsule (120 mg total) by mouth daily. 07/11/20   Janora Norlander, DO  fluticasone (FLONASE) 50 MCG/ACT nasal spray Place 2 sprays into both nostrils daily.     [provider]  levothyroxine (SYNTHROID) 25 MCG tablet Take 2 tablets (50 mcg total) by  mouth daily before breakfast. 03/16/20   Ronnie Doss M, DO  lidocaine (LIDODERM) 5 % Place 1 patch onto the skin daily. Remove & Discard patch within 12 hours or as directed by MD 07/11/20   Janora Norlander, DO  LINZESS 145 MCG CAPS capsule TAKE 1 CAPSULE DAILY TO REGULATE BOWEL MOVEMENTS 08/11/20   Ronnie Doss M, DO  loratadine (CLARITIN) 10 MG tablet Take 1 tablet (10 mg total) by mouth daily. 04/17/20   Janora Norlander, DO  meclizine (ANTIVERT) 25 MG tablet Take 1 tablet (25 mg total) by mouth 3 (three) times daily as needed for dizziness. 07/26/19   Gerlene Fee, NP  metoprolol tartrate (LOPRESSOR) 25 MG tablet TAKE 2 TABLETS IN THE MORNING, AND 1 TABLET IN THE EVENING 08/28/20   Minus Breeding, MD  montelukast (SINGULAIR) 10 MG tablet TAKE 1 TABLET ONCE A DAY 04/17/20   Ronnie Doss M, DO  Multiple Vitamin (MULTIVITAMIN) tablet Take 1 tablet by mouth daily. 07/21/19   [provider]  nitroGLYCERIN (NITROSTAT) 0.4 MG SL tablet Place 1 tablet (0.4 mg total) under the tongue every 5 (five) minutes as needed for chest pain. 07/26/19   Gerlene Fee, NP  omega-3 acid ethyl esters (LOVAZA) 1 g capsule Take 2 capsules (2 g total) by mouth 2 (two) times daily. 07/26/19   Gerlene Fee, NP  omeprazole-sodium bicarbonate (ZEGERID) 40-1100 MG capsule TAKE (1) CAPSULE DAILY 03/20/20   Ronnie Doss M, DO  ondansetron (ZOFRAN) 4 MG tablet TAKE 1 TABLET EVERY 6 HOURS AS NEEDED FOR NAUSEA & VOMITING 08/18/20   Ronnie Doss M, DO  polyethylene glycol (MIRALAX / GLYCOLAX) 17 g packet Take 17 g by mouth daily.    [provider]  rosuvastatin (CRESTOR) 20 MG tablet TAKE 1 TABLET ONCE DAILY 07/11/20   Ronnie Doss M, DO  sennosides-docusate sodium (SENOKOT-S) 8.6-50 MG tablet Take 1 tablet by mouth at bedtime.    [provider]  sertraline (ZOLOFT) 100 MG  tablet Take 1 tablet (100 mg total) by mouth daily. 12/03/19   Janora Norlander, DO  tamsulosin (FLOMAX) 0.4 MG CAPS capsule TAKE (1) CAPSULE DAILY 10/28/19   Gottschalk, Ashly M, DO  XARELTO 20 MG TABS tablet TAKE 1 TABLET ONCE DAILY 07/11/20   Ronnie Doss M, DO    Allergies    Naproxen, Penicillins, Sulfonamide derivatives, Aspirin, Captopril, Strawberry extract, and Clindamycin/lincomycin  Review of Systems   Review of Systems  All other systems reviewed and are negative.   Physical Exam Updated Vital Signs BP 136/76 (BP Location: Left Arm)   Pulse 66   Temp 98.1 F (36.7 C) (Oral)   Resp 20   Ht 5' (1.524 m)   Wt 49.9 kg   SpO2 100%   BMI 21.48 kg/m   Physical Exam Vitals and nursing note reviewed. Exam conducted with a chaperone present.  Constitutional:      General: She is not in acute distress.    Appearance: Normal appearance. She is not ill-appearing.  HENT:     Head: Normocephalic and atraumatic.  Eyes:     General: No scleral icterus.    Conjunctiva/sclera: Conjunctivae normal.  Cardiovascular:     Rate and Rhythm: Normal rate and regular rhythm.  Pulmonary:     Effort: Pulmonary effort is normal.  Musculoskeletal:     Comments: Patient is able to lift both legs with strength intact against resistance. Plantar flexion and dorsiflexion intact and symmetric. Pedal pulse intact and symmetric.  Sensation intact throughout. Chronic mild deformity and increased swelling (chronic) to right ankle s/p multiple surgeries.   No midline spinal tenderness to palpation. Able to move all extremities with strength.   Skin:    General: Skin is dry.     Capillary Refill: Capillary refill takes less than 2 seconds.  Neurological:     General: No focal deficit present.     Mental Status: She is alert and oriented to person, place, and time.     GCS: GCS eye subscore is 4. GCS verbal subscore is 5. GCS motor subscore is 6.     Cranial Nerves: No cranial nerve deficit.      Sensory: No sensory deficit.     Coordination: Coordination normal.  Psychiatric:        Mood and Affect: Mood normal.        Behavior: Behavior normal.        Thought Content: Thought content normal.     ED Results / Procedures / Treatments   Labs (all labs ordered are listed, but only abnormal results are displayed) Labs Reviewed  CBC WITH DIFFERENTIAL/PLATELET - Abnormal; Notable for the following components:      Result Value   RBC 3.70 (*)    Hemoglobin 11.3 (*)    Lymphs Abs 4.4 (*)    All other components within normal limits  COMPREHENSIVE METABOLIC PANEL - Abnormal; Notable for the following components:   Sodium 130 (*)    Glucose, Bld 101 (*)    Calcium 7.9 (*)    Total Protein 10.3 (*)    Albumin 2.8 (*)    All other components within normal limits  URINE CULTURE  URINALYSIS, ROUTINE W REFLEX MICROSCOPIC  TSH  TROPONIN I (HIGH SENSITIVITY)  TROPONIN I (HIGH SENSITIVITY)    EKG EKG Interpretation  Date/Time:  Thursday August 31 2020 13:48:01 EDT Ventricular Rate:  66 PR Interval:  218 QRS Duration: 82 QT Interval:  438 QTC Calculation: 459 R Axis:   33 Text Interpretation: Sinus rhythm with 1st degree A-V block Otherwise normal ECG Since last tracing rate slower and now regular no atrial flutter Otherwise no significant change Confirmed by Daleen Bo [phone]) on 08/31/2020 3:47:16 PM   Radiology CT Chest W Contrast  Result Date: 08/31/2020 CLINICAL DATA:  Recent diagnosis of lymphoma. Weakness. Staging. Lost usage of both legs today. EXAM: CT CHEST, ABDOMEN, AND PELVIS WITH CONTRAST TECHNIQUE: Multidetector CT imaging of the chest, abdomen and pelvis was performed following the standard protocol during bolus administration of intravenous contrast. CONTRAST:  91mL OMNIPAQUE IOHEXOL 300 MG/ML  SOLN COMPARISON:  Renal ultrasound 09/25/2015. Today's thoracolumbar spine MRI has not yet been performed. A chest radiograph of 02/11/2017 is reviewed.  FINDINGS: CT CHEST FINDINGS Cardiovascular: Aortic atherosclerosis. Tortuous thoracic aorta. Mild cardiomegaly, without pericardial effusion. Multivessel coronary artery atherosclerosis. No central pulmonary embolism, on this non-dedicated study. Mediastinum/Nodes: Low cervical adenopathy, with a left low jugular node measuring 1.0 cm on 61/2. Bilateral subpectoral and axillary adenopathy. Index left axillary node measures 2.2 x 2.2 cm on 13/2. Right paratracheal node of 1.2 cm on 21/2. Right hilar node measures 1.5 cm on 26/2. Left internal mammary adenopathy at 9 mm on 26/2. Lungs/Pleura: Trace right pleural thickening. Mild centrilobular emphysema. Left apical calcified granuloma 3 mm. Musculoskeletal: Degenerative changes of both shoulders. Mild osteopenia. A spinal catheter terminates at the T11 level and communicates with the right pleural space. Thoracic spondylosis with mild T11 compression deformity. No ventral canal encroachment. CT ABDOMEN  PELVIS FINDINGS Hepatobiliary: Normal liver. Small gallstones without acute cholecystitis or biliary duct dilatation. Pancreas: Normal, without mass or ductal dilatation. Spleen: Normal in size, without focal abnormality. Adrenals/Urinary Tract: Normal adrenal glands. Normal kidneys, without hydronephrosis. Normal urinary bladder. Stomach/Bowel: Proximal gastric underdistention. Normal colon and terminal ileum. The cecum extends into the central right pelvis. The appendix is not visualized. Normal small bowel. Vascular/Lymphatic: Marked abdominal retroperitoneal adenopathy. Left periaortic component measures 3.7 cm short axis on 63/2. Portocaval nodal mass measures 2.8 x 5.5 cm on 58/2. Adenopathy within the transverse mesocolon including at 8 mm on 66/2. Pelvic sidewall adenopathy, with an index right external iliac nodal mass of 1.4 cm short axis on 94/2. Right greater than left inguinal adenopathy. Reproductive: Hysterectomy.  No adnexal mass. Other: No significant  free fluid.  Moderate pelvic floor laxity. Musculoskeletal: Osteopenia. Mild superior endplate compression deformity at L4. IMPRESSION: 1. Adenopathy within the neck, chest, abdomen, and pelvis, consistent with lymphoma. 2. Aortic atherosclerosis (ICD10-I70.0), coronary artery atherosclerosis and emphysema (ICD10-J43.9). 3. Mild thoracolumbar compression deformities, without ventral canal encroachment. 4. Cholelithiasis Electronically Signed   By: Abigail Miyamoto M.D.   On: 08/31/2020 15:15   CT ABDOMEN PELVIS W CONTRAST  Result Date: 08/31/2020 CLINICAL DATA:  Recent diagnosis of lymphoma. Weakness. Staging. Lost usage of both legs today. EXAM: CT CHEST, ABDOMEN, AND PELVIS WITH CONTRAST TECHNIQUE: Multidetector CT imaging of the chest, abdomen and pelvis was performed following the standard protocol during bolus administration of intravenous contrast. CONTRAST:  69mL OMNIPAQUE IOHEXOL 300 MG/ML  SOLN COMPARISON:  Renal ultrasound 09/25/2015. Today's thoracolumbar spine MRI has not yet been performed. A chest radiograph of 02/11/2017 is reviewed. FINDINGS: CT CHEST FINDINGS Cardiovascular: Aortic atherosclerosis. Tortuous thoracic aorta. Mild cardiomegaly, without pericardial effusion. Multivessel coronary artery atherosclerosis. No central pulmonary embolism, on this non-dedicated study. Mediastinum/Nodes: Low cervical adenopathy, with a left low jugular node measuring 1.0 cm on 61/2. Bilateral subpectoral and axillary adenopathy. Index left axillary node measures 2.2 x 2.2 cm on 13/2. Right paratracheal node of 1.2 cm on 21/2. Right hilar node measures 1.5 cm on 26/2. Left internal mammary adenopathy at 9 mm on 26/2. Lungs/Pleura: Trace right pleural thickening. Mild centrilobular emphysema. Left apical calcified granuloma 3 mm. Musculoskeletal: Degenerative changes of both shoulders. Mild osteopenia. A spinal catheter terminates at the T11 level and communicates with the right pleural space. Thoracic  spondylosis with mild T11 compression deformity. No ventral canal encroachment. CT ABDOMEN PELVIS FINDINGS Hepatobiliary: Normal liver. Small gallstones without acute cholecystitis or biliary duct dilatation. Pancreas: Normal, without mass or ductal dilatation. Spleen: Normal in size, without focal abnormality. Adrenals/Urinary Tract: Normal adrenal glands. Normal kidneys, without hydronephrosis. Normal urinary bladder. Stomach/Bowel: Proximal gastric underdistention. Normal colon and terminal ileum. The cecum extends into the central right pelvis. The appendix is not visualized. Normal small bowel. Vascular/Lymphatic: Marked abdominal retroperitoneal adenopathy. Left periaortic component measures 3.7 cm short axis on 63/2. Portocaval nodal mass measures 2.8 x 5.5 cm on 58/2. Adenopathy within the transverse mesocolon including at 8 mm on 66/2. Pelvic sidewall adenopathy, with an index right external iliac nodal mass of 1.4 cm short axis on 94/2. Right greater than left inguinal adenopathy. Reproductive: Hysterectomy.  No adnexal mass. Other: No significant free fluid.  Moderate pelvic floor laxity. Musculoskeletal: Osteopenia. Mild superior endplate compression deformity at L4. IMPRESSION: 1. Adenopathy within the neck, chest, abdomen, and pelvis, consistent with lymphoma. 2. Aortic atherosclerosis (ICD10-I70.0), coronary artery atherosclerosis and emphysema (ICD10-J43.9). 3. Mild thoracolumbar compression deformities, without ventral canal  encroachment. 4. Cholelithiasis Electronically Signed   By: Abigail Miyamoto M.D.   On: 08/31/2020 15:15    Procedures Procedures (including critical care time)  Medications Ordered in ED Medications  sodium chloride 0.9 % bolus 500 mL (0 mLs Intravenous Stopped 08/31/20 1500)  LORazepam (ATIVAN) tablet 1 mg (1 mg Oral Given 08/31/20 1416)  iohexol (OMNIPAQUE) 300 MG/ML solution 75 mL (75 mLs Intravenous Contrast Given 08/31/20 1441)    ED Course  I have reviewed the  triage vital signs and the nursing notes.  Pertinent labs & imaging results that were available during my care of the patient were reviewed by me and considered in my medical decision making (see chart for details).  Clinical Course as of Aug 31 1738  Thu Aug 31, 2020  1351 I spoke with Dr. Jolayne Haines, her oncologist, who is in clinic and was able to provide me with further information.  Patient has CLL has been indolent. She reports that patient c/o chronic weakness and she has had discussions with her son about possible home hospice given her progressive failure to thrive and difficulty with ADLs.  Patient was supposed to receive a chest, abdomen, and pelvis with contrast today outpatient, but was unable to go due to her weakness.  She asked that I call her back on her cell at [phone] regarding findings.    [GG]  1416 Ordered MRI given spinal hx and her reported progressively worsening weakness, particularly in lower extremities.  I spoke with radiology.  Patient is not a candidate for MRI given residual wires from failed attempt at fully removing previously placed stimulator.  I spoke with CT and they will able to pay close attention to lumbar and thoracic spine during already ordered chest/abd/pelvis with contrast.     [GG]  1606 I spoke with son.  He and patient had conversation with hospice nurse just a few days ago.  Hopefully she will soon have assistance at home 5 days a week.  However, given her increasing weakness and recent falls, he would appreciate more information on SNF placement.  I have reached out to case management who will contact him and his mother.  In the interim, he will work towards having a friend stay with patient for additional support at home until more concrete arrangements can be made.   [GG]    Clinical Course User Index [GG] Corena Herter, PA-C   MDM Rules/Calculators/A&P                          I spoke with Dr. Jolayne Haines, her oncologist, who is in clinic and  was able to provide me with further information.  Patient has CLL has been indolent. She reports that patient c/o chronic weakness and she has had discussions with her son about possible home hospice given her progressive failure to thrive and difficulty with ADLs.  Patient was supposed to receive a chest, abdomen, and pelvis with contrast today outpatient, but was unable to go due to her weakness.  She asked that I call her back on her cell at [phone] regarding findings.   Ordered MRI given spinal hx and her reported progressively worsening weakness, particularly in lower extremities.  I spoke with radiology.  Patient is not a candidate for MRI given residual wires from failed attempt at fully removing previously placed stimulator.  I spoke with CT and they will able to pay close attention to lumbar and thoracic spine during already ordered  chest/abd/pelvis with contrast.    Labs are unremarkable.  Mild anemia with hemoglobin 11.3 consistent with baseline.  Platelet count within normal limits.  Troponin trended x2 and WNL.  CMP notable for mild hyponatremia 130, IV bolus was administered here in the ED.  She appears to be chronically in the 130-135 range.  Hypocalcemia to 7.9, likely reflective of lymphoma.  She also has downtrending albumin, likely in setting of failure to thrive and diminished p.o. intake.  EKG personally reviewed and reveals NSR with 1st degree heart block.  No significant changes from prior tracings.    CT chest, abdomen, and pelvis reveals adenopathy within the neck, chest, abdomen, and pelvis consistent with lymphoma.  There is also mild thoracolumbar compression deformities, but without any ventral canal encroachment.  Cholelithiasis without acute cholecystitis.  Portacaval node mass measuring 2.8 x 5.5 cm.  I spoke with oncologist, Dr. Jolayne Haines, regarding the findings on CT.  She will reach out to son and patient to discuss outpatient follow-up.    I spoke with son.  He and  patient had conversation with hospice nurse just a few days ago.  Hopefully she will soon have assistance at home 5 days a week.  However, given her increasing weakness and recent falls, he would appreciate more information on SNF placement.  I have reached out to case management who will contact him and his mother.  In the interim, he will work towards having a friend stay with patient for additional support at home until more concrete arrangements can be made.  All of the evaluation and work-up results were discussed with the patient and any family at bedside.  Patient and/or family were informed that while patient is appropriate for discharge at this time, some medical emergencies may only develop or become detectable after a period of time.  I specifically instructed patient and/or family to return to return to the ED or seek immediate medical attention for any new or worsening symptoms.  They were provided opportunity to ask any additional questions and have none at this time.  Prior to discharge patient is feeling well, agreeable with plan for discharge home.  They have expressed understanding of verbal discharge instructions as well as return precautions and are agreeable to the plan.   Spoke with case management. Essentially, given her financial constraints, she is only eligible for SNF placement. I was also informed by the social worker that this is very difficult to obtain on outpatient basis as I would like. Patient would instead need to be boarded here in the ED with PT assessment and case worker assessment in the morning. She then tells me that she would likely be able to have the patient placed with SNF tomorrow. Patient clearly does have the capacity for making decisions. She is answering questions appropriately throughout my examination and her cognition does not appear to be impaired. When I approached this with the patient, she did not want to move forward with boarding here in the ED for SNF  placement tomorrow.  She states that she had a bad experience with a rehab facility/SNF in the past and does not want to wind up in similar situation.  However, her son then spoke with her regarding his concern surrounding her recent falls and weakness that is progressively getting worse.  She is also endorsing weight loss and failure to thrive.  I do believe that she would benefit from SNF placement.  After discussion with her son, she is now agreeable.  We  will board her here in the ED.  Plan for PT consult and then case management evaluation in the morning to help determine placement.   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

## 2020-08-31 NOTE — ED Triage Notes (Signed)
Pt transported by [facility], states patient was recently diagnosed with cancer and called with complaints of leg weakness. No reported falls. Pt states she has had weakness for 2-3 months and has began worsening in both legs over the last few weeks.

## 2020-08-31 NOTE — ED Notes (Signed)
Pt transported to MRI 

## 2020-08-31 NOTE — Discharge Instructions (Addendum)
Go to [facility]  Please follow-up with your primary care provider as well as your oncologist, Dr. Jolayne Haines, to schedule an appointment for further evaluation and management.   Given your falls and progressively worsening weakness in the context of lymphoma, I believe you would benefit from additional assistance.  Return to the ED or seek immediate medical attention should you experience any new or worsening symptoms.

## 2020-08-31 NOTE — ED Provider Notes (Signed)
  Face-to-face evaluation   History: She presents for ongoing back pain, and weakness, which is worse when standing 5 to 10 minutes doing things such as working in the kitchen.  She ambulates with a rolling walker.  Today she noticed increased urinary symptoms including urgency, and large volume of urination.  She occasionally has decreased stooling, but denies leakage of bowel contents.  She denies urinary incontinence.  She denies fever, chills, nausea or vomiting.  She presents for evaluation, by EMS.  Physical exam: Frail elderly female.  She is alert and cooperative.  No respiratory distress.  No dysarthria or aphasia.  Abdomen soft and nontender.  She is able to move arms and legs equally.  No gross weakness of the arms or legs.  Medical screening examination/treatment/procedure(s) were conducted as a shared visit with non-physician practitioner(s) and myself.  I personally evaluated the patient during the encounter    Daleen Bo, MD 08/31/20 [phone]

## 2020-09-01 LAB — URINE CULTURE: Culture: <10000 — AB

## 2020-09-01 LAB — TSH: TSH: 2.186 u[IU]/mL (ref 0.350–4.500)

## 2020-09-01 MED ORDER — ACETAMINOPHEN 325 MG PO TABS
650.0000 mg | ORAL_TABLET | Freq: Four times a day (QID) | ORAL | Status: DC | PRN
  Administered 2020-09-01 (×2): 650 mg via ORAL
  Filled 2020-09-01 (×2): qty 2

## 2020-09-01 MED ORDER — FLUTICASONE PROPIONATE 50 MCG/ACT NA SUSP
2.0000 | Freq: Every day | NASAL | Status: DC
  Administered 2020-09-02: 2 via NASAL
  Filled 2020-09-01: qty 16

## 2020-09-01 MED ORDER — LIDOCAINE 5 % EX PTCH
1.0000 | MEDICATED_PATCH | CUTANEOUS | Status: DC
  Administered 2020-09-01: 1 via TRANSDERMAL
  Filled 2020-09-01: qty 1

## 2020-09-01 MED ORDER — GUAIFENESIN ER 600 MG PO TB12
600.0000 mg | ORAL_TABLET | Freq: Two times a day (BID) | ORAL | Status: DC
  Administered 2020-09-01 – 2020-09-02 (×2): 600 mg via ORAL
  Filled 2020-09-01 (×2): qty 1

## 2020-09-01 NOTE — ED Notes (Signed)
Updated grandson at this time

## 2020-09-01 NOTE — [place] FL2 (Signed)
[place] MEDICAID FL2 LEVEL OF CARE SCREENING TOOL     IDENTIFICATION  Patient Name: [name] Birthdate: [birth year] Sex: female Admission Date (Current Location): 08/31/2020  [facility] and [place] Number:  [employer] and Address:  [facility] [address], [place]      Provider Number: [id number]  Attending Physician Name and Address:  Default, Provider, MD  Relative Name and Phone Number:  Malka So    Current Level of Care: Hospital Recommended Level of Care: [place] Prior Approval Number:    Date Approved/Denied:   PASRR Number:    Discharge Plan: SNF    Current Diagnoses: Patient Active Problem List   Diagnosis Date Noted  . Educated about COVID-19 virus infection 04/04/2020  . Low grade malignant lymphoma ([place]) 03/08/2020  . Chronic diastolic (congestive) heart failure ([place]) 07/23/2019  . Chronic constipation 07/23/2019  . Urine retention 07/23/2019  . Arachnoid cyst of spine 07/23/2019  . Chronic hyponatremia 07/23/2019  . Palpitations 04/02/2019  . Snoring 02/19/2018  . Other fatigue 02/19/2018  . Chronic pain syndrome 01/20/2018  . Chronic low back pain 01/20/2018  . Chronic pain 07/02/2017  . Vocal cord dysfunction 02/04/2017  . Irritable bowel syndrome with diarrhea 12/11/2015  . UTI (urinary tract infection) 08/22/2015  . Cough 07/26/2015  . Diarrhea 11/30/2014  . Heme + stool 11/30/2014  . Chronic anticoagulation 11/30/2014  . Hypertensive heart disease with chronic diastolic congestive heart failure ([place]) 04/30/2014  . 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 Osteoporosis 01/27/2014  . Fibromyalgia 08/09/2013  . COPD with asthma ([place]) 08/01/2010  . Acquired hypothyroidism 03/20/2010  . Major depression, chronic 03/20/2010  . ESOPHAGEAL MOTILITY DISORDER 03/20/2010  . Hyperlipidemia 04/22/2009  . Coronary atherosclerosis 04/22/2009  . ATRIAL FIBRILLATION, PAROXYSMAL  04/22/2009  . ESOPHAGEAL STRICTURE 10/31/2004  . GERD 10/31/2004  . HIATAL HERNIA 10/06/2001    Orientation RESPIRATION BLADDER Height & Weight     Self, Time, Situation  Normal Continent Weight: 110 lb (49.9 kg) Height:  5' (152.4 cm)  BEHAVIORAL SYMPTOMS/MOOD NEUROLOGICAL BOWEL NUTRITION STATUS      Continent Diet (Regular diet)  AMBULATORY STATUS COMMUNICATION OF NEEDS Skin   Extensive Assist Verbally Normal                       Personal Care Assistance Level of Assistance  Bathing, Feeding, Dressing Bathing Assistance: Limited assistance Feeding assistance: Independent Dressing Assistance: Limited assistance     Functional Limitations Info  Sight, Hearing, Speech Sight Info: Adequate Hearing Info: Adequate Speech Info: Adequate    SPECIAL CARE FACTORS FREQUENCY  PT (By licensed PT)     PT Frequency: 5x's/week              Contractures Contractures Info: Not present    Additional Factors Info  Allergies, Psychotropic   Allergies Info: Naproxen; Penicillins; Sulfonamide Derivatives; Aspirin; Captopril; Strawberry Extract; Clindamycin/lincomycin Psychotropic Info: Zoloft (sertraline); Xanax (alprazolam)         Current Medications (09/01/2020):  This is the current hospital active medication list Current Facility-Administered Medications  Medication Dose Route Frequency Provider Last Rate Last Admin  . acetaminophen (TYLENOL) tablet 650 mg  650 mg Oral Q6H PRN Virgel Manifold, MD   650 mg at 09/01/20 0921  . albuterol (VENTOLIN HFA) 108 (90 Base) MCG/ACT inhaler 2 puff  2 puff Inhalation Q4H PRN Corena Herter, PA-C      . ALPRAZolam Duanne Moron) tablet 0.5-1  mg  0.5-1 mg Oral QHS PRN Corena Herter, PA-C   1 mg at 08/31/20 2115  . ascorbic acid (VITAMIN C) tablet 1,000 mg  1,000 mg Oral Daily Corena Herter, PA-C   1,000 mg at 09/01/20 0919  . cyclobenzaprine (FLEXERIL) tablet 10 mg  10 mg Oral TID PRN Krista Blue L, PA-C   10 mg at 08/31/20 1854   . dicyclomine (BENTYL) capsule 10 mg  10 mg Oral TID PRN Corena Herter, PA-C      . levothyroxine (SYNTHROID) tablet 50 mcg  50 mcg Oral QAC breakfast Corena Herter, PA-C   50 mcg at 09/01/20 0918  . linaclotide (LINZESS) capsule 145 mcg  145 mcg Oral QAC breakfast Corena Herter, PA-C   145 mcg at 09/01/20 0916  . metoprolol tartrate (LOPRESSOR) tablet 25 mg  25 mg Oral BID Corena Herter, PA-C   25 mg at 09/01/20 0920  . omega-3 acid ethyl esters (LOVAZA) capsule 2 g  2 g Oral BID Krista Blue L, PA-C   2 g at 09/01/20 0915  . Rivaroxaban (XARELTO) tablet 15 mg  15 mg Oral Q supper Corena Herter, PA-C   15 mg at 08/31/20 2115  . rosuvastatin (CRESTOR) tablet 20 mg  20 mg Oral Daily Krista Blue L, PA-C   20 mg at 09/01/20 0917  . sertraline (ZOLOFT) tablet 100 mg  100 mg Oral Daily Corena Herter, PA-C   100 mg at 09/01/20 1779   Current Outpatient Medications  Medication Sig Dispense Refill  . albuterol (VENTOLIN HFA) 108 (90 Base) MCG/ACT inhaler Inhale 2 puffs into the lungs every 4 (four) hours as needed for wheezing or shortness of breath. 6.7 g 0  . ALPRAZolam (XANAX) 0.5 MG tablet Take 1-2 tablets (0.5-1 mg total) by mouth at bedtime as needed for anxiety. 45 tablet 2  . Ascorbic Acid (VITAMIN C) 1000 MG tablet Take 1,000 mg by mouth daily.    . beta carotene 25000 UNIT capsule Take 25,000 Units by mouth daily.    . budesonide (PULMICORT) 0.5 MG/2ML nebulizer solution Take 2 mLs (0.5 mg total) by nebulization 2 (two) times daily. 120 mL 1  . Coenzyme Q10 10 MG capsule Take 10 mg by mouth daily.    . cyclobenzaprine (FLEXERIL) 10 MG tablet Take 10 mg by mouth 3 (three) times daily as needed.    Marland Kitchen denosumab (PROLIA) 60 MG/ML SOSY injection Inject 60 mg every 6 (six) months into the skin. Administer in upper arm, thigh, or abdomen 1 mL 0  . dicyclomine (BENTYL) 10 MG capsule TAKE 1 CAPSULE FOUR TIMES DAILY AS NEEDED FOR SEVERE PAIN (Patient taking differently: Take 10 mg  by mouth 4 (four) times daily as needed (severe pain). ) 30 capsule 0  . diltiazem (CARDIZEM CD) 120 MG 24 hr capsule Take 1 capsule (120 mg total) by mouth daily. 90 capsule 0  . fluticasone (FLONASE) 50 MCG/ACT nasal spray Place 2 sprays into both nostrils daily.    Marland Kitchen levothyroxine (SYNTHROID) 25 MCG tablet Take 2 tablets (50 mcg total) by mouth daily before breakfast. 60 tablet 5  . lidocaine (LIDODERM) 5 % Place 1 patch onto the skin daily. Remove & Discard patch within 12 hours or as directed by MD 30 patch 12  . LINZESS 145 MCG CAPS capsule TAKE 1 CAPSULE DAILY TO REGULATE BOWEL MOVEMENTS (Patient taking differently: Take 145 mcg by mouth daily. ) 30 capsule 2  . loratadine (CLARITIN) 10  MG tablet Take 1 tablet (10 mg total) by mouth daily. 90 tablet 1  . meclizine (ANTIVERT) 25 MG tablet Take 1 tablet (25 mg total) by mouth 3 (three) times daily as needed for dizziness. 30 tablet 0  . metoprolol tartrate (LOPRESSOR) 25 MG tablet TAKE 2 TABLETS IN THE MORNING, AND 1 TABLET IN THE EVENING (Patient taking differently: See admin instructions. TAKE 2 TABLETS IN THE MORNING, AND 1 TABLET IN THE EVENING) 90 tablet 0  . montelukast (SINGULAIR) 10 MG tablet TAKE 1 TABLET ONCE A DAY 90 tablet 1  . Multiple Vitamin (MULTIVITAMIN) tablet Take 1 tablet by mouth daily.    . nitroGLYCERIN (NITROSTAT) 0.4 MG SL tablet Place 1 tablet (0.4 mg total) under the tongue every 5 (five) minutes as needed for chest pain. 30 tablet 0  . omega-3 acid ethyl esters (LOVAZA) 1 g capsule Take 2 capsules (2 g total) by mouth 2 (two) times daily. 120 capsule 0  . omeprazole-sodium bicarbonate (ZEGERID) 40-1100 MG capsule TAKE (1) CAPSULE DAILY (Patient taking differently: Take 1 capsule by mouth daily. ) 30 capsule 5  . ondansetron (ZOFRAN) 4 MG tablet TAKE 1 TABLET EVERY 6 HOURS AS NEEDED FOR NAUSEA & VOMITING 20 tablet 0  . polyethylene glycol (MIRALAX / GLYCOLAX) 17 g packet Take 17 g by mouth daily.    . rosuvastatin  (CRESTOR) 20 MG tablet TAKE 1 TABLET ONCE DAILY 90 tablet 0  . sennosides-docusate sodium (SENOKOT-S) 8.6-50 MG tablet Take 1 tablet by mouth at bedtime.    . sertraline (ZOLOFT) 100 MG tablet Take 1 tablet (100 mg total) by mouth daily. 90 tablet 3  . tamsulosin (FLOMAX) 0.4 MG CAPS capsule TAKE (1) CAPSULE DAILY (Patient taking differently: Take 0.4 mg by mouth daily. ) 90 capsule 0  . XARELTO 20 MG TABS tablet TAKE 1 TABLET ONCE DAILY 90 tablet 0     Discharge Medications: Please see discharge summary for a list of discharge medications.  Relevant Imaging Results:  Relevant Lab Results:   Additional Information SSN: [id number]  Sherie Don, LCSW

## 2020-09-01 NOTE — TOC Progression Note (Addendum)
Transition of Care [facility]) - Progression Note   Patient Details  Name: [name] MRN: [id number] Date of Birth: [birth year]  Transition of Care [facility]) CM/SW [place], LCSW Phone Number: 09/01/2020, 3:17 PM  Clinical Narrative: PT evaluation recommends SNF. FL2 completed and faxed out. CSW awaiting bed offers.  Addendum: [facility] made bed offer for [facility] and can accept patient tomorrow if insurance authorization is approved. CSW spoke with patient's son, Malka So, and he agreeable to [facility]. CSW called [facility] and spoke with Tiffany to start insurance [place]. Patient's reference # is: [id number]. Requested clinicals faxed to [facility].  Expected Discharge Plan: Skilled Nursing Facility Barriers to Discharge: Other (comment) (Patient has cancer and family cannot afford ALF/LTC private pay)  Expected Discharge Plan and Services Expected Discharge Plan: [place] In-house Referral: Clinical Social Work Discharge Planning Services: NA Living arrangements for the past 2 months: Apartment  Readmission Risk Interventions No flowsheet data found.

## 2020-09-01 NOTE — Plan of Care (Signed)
°  Problem: Acute Rehab PT Goals(only PT should resolve) Goal: Pt Will Go Supine/Side To Sit Outcome: Progressing Flowsheets (Taken 09/01/2020 1456) Pt will go Supine/Side to Sit: with min guard assist Goal: Patient Will Transfer Sit To/From Stand Outcome: Progressing Flowsheets (Taken 09/01/2020 1456) Patient will transfer sit to/from stand:  with minimal assist  with min guard assist Goal: Pt Will Transfer Bed To Chair/Chair To Bed Outcome: Progressing Flowsheets (Taken 09/01/2020 1456) Pt will Transfer Bed to Chair/Chair to Bed:  with min assist  min guard assist Goal: Pt Will Ambulate Outcome: Progressing Flowsheets (Taken 09/01/2020 1456) Pt will Ambulate:  50 feet  with min guard assist  with minimal assist  with rolling walker   2:56 PM, 09/01/20 Lonell Grandchild, MPT Physical Therapist with [facility] [phone] office [phone] mobile phone

## 2020-09-01 NOTE — Evaluation (Signed)
Physical Therapy Evaluation Patient Details Name: [name] MRN: [id number] DOB: 04-26-1936 Today's Date: 09/01/2020   History of Present Illness  History: She presents for ongoing back pain, and weakness, which is worse when standing 5 to 10 minutes doing things such as working in the kitchen.  She ambulates with a rolling walker.  Today she noticed increased urinary symptoms including urgency, and large volume of urination.  She occasionally has decreased stooling, but denies leakage of bowel contents.  She denies urinary incontinence.  She denies fever, chills, nausea or vomiting.  She presents for evaluation, by EMS.    Clinical Impression  Patient demonstrates slow labored movement for sitting up at bedside with c/o low back pain/discomfort, very unsteady on feet and limited to ambulation in room due to fatigue and fall risk.  Patient required Min/mod assist to reposition when put back to bed.  Patient will benefit from continued physical therapy in hospital and recommended venue below to increase strength, balance, endurance for safe ADLs and gait.     Follow Up Recommendations SNF    Equipment Recommendations  None recommended by PT    Recommendations for Other Services       Precautions / Restrictions Precautions Precautions: Fall Restrictions Weight Bearing Restrictions: No      Mobility  Bed Mobility Overal bed mobility: Needs Assistance Bed Mobility: Supine to Sit;Sit to Supine     Supine to sit: Min assist;Mod assist Sit to supine: Min assist   General bed mobility comments: increased time, labored movement  Transfers Overall transfer level: Needs assistance Equipment used: Rolling walker (2 wheeled) Transfers: Sit to/from [facility] Sit to Stand: Min assist Stand pivot transfers: Min assist;Mod assist       General transfer comment: unsteady labored movement  Ambulation/Gait Ambulation/Gait assistance: Min assist;Mod assist Gait  Distance (Feet): 15 Feet Assistive device: Rolling walker (2 wheeled) Gait Pattern/deviations: Decreased step length - right;Decreased step length - left;Decreased stride length Gait velocity: decreased   General Gait Details: slow labored unsteady cadence, limited mostly due to fatgue, poor standing balance  Stairs            Wheelchair Mobility    Modified Rankin (Stroke Patients Only)       Balance Overall balance assessment: Needs assistance Sitting-balance support: Bilateral upper extremity supported;Feet supported Sitting balance-Leahy Scale: Fair Sitting balance - Comments: seated at bedside   Standing balance support: Bilateral upper extremity supported;During functional activity Standing balance-Leahy Scale: Poor Standing balance comment: fair/poor using RW                             Pertinent Vitals/Pain Pain Assessment: Faces Faces Pain Scale: Hurts a little bit Pain Location: low back Pain Descriptors / Indicators: Aching;Guarding Pain Intervention(s): Limited activity within patient's tolerance;Monitored during session;Repositioned    Home Living Family/patient expects to be discharged to:: Private residence Living Arrangements: Alone Available Help at Discharge: Friend(s);Available PRN/intermittently Type of Home: Apartment Home Access: Level entry     Home Layout: One level Home Equipment: Walker - 4 wheels;Shower seat;Toilet riser;Wheelchair - manual      Prior Function Level of Independence: Needs assistance   Gait / Transfers Assistance Needed: household ambulator using RW  ADL's / Homemaking Assistance Needed: friends drops by as needed to help with ADLs        Hand Dominance        Extremity/Trunk Assessment   Upper Extremity Assessment Upper Extremity  Assessment: Generalized weakness    Lower Extremity Assessment Lower Extremity Assessment: Generalized weakness    Cervical / Trunk Assessment Cervical / Trunk  Assessment: Kyphotic  Communication   Communication: No difficulties  Cognition Arousal/Alertness: Awake/alert Behavior During Therapy: WFL for tasks assessed/performed Overall Cognitive Status: Within Functional Limits for tasks assessed                                        General Comments      Exercises     Assessment/Plan    PT Assessment Patient needs continued PT services  PT Problem List Decreased strength;Decreased activity tolerance;Decreased balance;Decreased mobility       PT Treatment Interventions DME instruction;Gait training;Stair training;Functional mobility training;Therapeutic activities;Therapeutic exercise;Balance training;Patient/family education    PT Goals (Current goals can be found in the Care Plan section)  Acute Rehab PT Goals Patient Stated Goal: return home after rehab PT Goal Formulation: With patient Time For Goal Achievement: 09/15/20 Potential to Achieve Goals: Good    Frequency Min 2X/week   Barriers to discharge        Co-evaluation               AM-PAC PT "6 Clicks" Mobility  Outcome Measure Help needed turning from your back to your side while in a flat bed without using bedrails?: A Lot Help needed moving from lying on your back to sitting on the side of a flat bed without using bedrails?: A Lot Help needed moving to and from a bed to a chair (including a wheelchair)?: A Lot Help needed standing up from a chair using your arms (e.g., wheelchair or bedside chair)?: A Lot Help needed to walk in hospital room?: A Lot Help needed climbing 3-5 steps with a railing? : A Lot 6 Click Score: 12    End of Session   Activity Tolerance: Patient tolerated treatment well;Patient limited by fatigue Patient left: in bed;with call bell/phone within reach Nurse Communication: Mobility status PT Visit Diagnosis: Unsteadiness on feet (R26.81);Other abnormalities of gait and mobility (R26.89);Muscle weakness (generalized)  (M62.81);History of falling (Z91.81)    Time: 3474-2595 PT Time Calculation (min) (ACUTE ONLY): 23 min   Charges:   PT Evaluation $PT Eval Moderate Complexity: 1 Mod PT Treatments $Therapeutic Activity: 23-37 mins        2:54 PM, 09/01/20 Lonell Grandchild, MPT Physical Therapist with [facility] 336 [phone] office [phone] mobile phone

## 2020-09-02 DIAGNOSIS — Z79899 Other long term (current) drug therapy: Secondary | ICD-10-CM | POA: Diagnosis not present

## 2020-09-02 DIAGNOSIS — R531 Weakness: Secondary | ICD-10-CM | POA: Diagnosis not present

## 2020-09-02 DIAGNOSIS — M545 Low back pain, unspecified: Secondary | ICD-10-CM | POA: Diagnosis not present

## 2020-09-02 DIAGNOSIS — C8598 Non-Hodgkin lymphoma, unspecified, lymph nodes of multiple sites: Secondary | ICD-10-CM | POA: Diagnosis not present

## 2020-09-02 DIAGNOSIS — Z7401 Bed confinement status: Secondary | ICD-10-CM | POA: Diagnosis not present

## 2020-09-02 DIAGNOSIS — Z743 Need for continuous supervision: Secondary | ICD-10-CM | POA: Diagnosis not present

## 2020-09-02 DIAGNOSIS — I110 Hypertensive heart disease with heart failure: Secondary | ICD-10-CM | POA: Diagnosis not present

## 2020-09-02 DIAGNOSIS — C8590 Non-Hodgkin lymphoma, unspecified, unspecified site: Secondary | ICD-10-CM | POA: Diagnosis not present

## 2020-09-02 DIAGNOSIS — E039 Hypothyroidism, unspecified: Secondary | ICD-10-CM | POA: Diagnosis not present

## 2020-09-02 DIAGNOSIS — J449 Chronic obstructive pulmonary disease, unspecified: Secondary | ICD-10-CM | POA: Diagnosis not present

## 2020-09-02 DIAGNOSIS — K5900 Constipation, unspecified: Secondary | ICD-10-CM | POA: Diagnosis not present

## 2020-09-02 DIAGNOSIS — Z87891 Personal history of nicotine dependence: Secondary | ICD-10-CM | POA: Diagnosis not present

## 2020-09-02 DIAGNOSIS — G8929 Other chronic pain: Secondary | ICD-10-CM | POA: Diagnosis not present

## 2020-09-02 DIAGNOSIS — R339 Retention of urine, unspecified: Secondary | ICD-10-CM | POA: Diagnosis not present

## 2020-09-02 DIAGNOSIS — Z7951 Long term (current) use of inhaled steroids: Secondary | ICD-10-CM | POA: Diagnosis not present

## 2020-09-02 DIAGNOSIS — Z96651 Presence of right artificial knee joint: Secondary | ICD-10-CM | POA: Diagnosis not present

## 2020-09-02 DIAGNOSIS — K219 Gastro-esophageal reflux disease without esophagitis: Secondary | ICD-10-CM | POA: Diagnosis not present

## 2020-09-02 DIAGNOSIS — S22000A Wedge compression fracture of unspecified thoracic vertebra, initial encounter for closed fracture: Secondary | ICD-10-CM | POA: Diagnosis not present

## 2020-09-02 DIAGNOSIS — I5032 Chronic diastolic (congestive) heart failure: Secondary | ICD-10-CM | POA: Diagnosis not present

## 2020-09-02 DIAGNOSIS — M6281 Muscle weakness (generalized): Secondary | ICD-10-CM | POA: Diagnosis not present

## 2020-09-02 DIAGNOSIS — I2510 Atherosclerotic heart disease of native coronary artery without angina pectoris: Secondary | ICD-10-CM | POA: Diagnosis not present

## 2020-09-02 DIAGNOSIS — Z7901 Long term (current) use of anticoagulants: Secondary | ICD-10-CM | POA: Diagnosis not present

## 2020-09-02 DIAGNOSIS — R6889 Other general symptoms and signs: Secondary | ICD-10-CM | POA: Diagnosis not present

## 2020-09-02 DIAGNOSIS — R2689 Other abnormalities of gait and mobility: Secondary | ICD-10-CM | POA: Diagnosis not present

## 2020-09-02 NOTE — ED Provider Notes (Signed)
Discharge Summary  Patient was seen in the emergency department on September 16 for worsening generalized weakness. Patient had history of indolent cancer and a phone consult was obtained by the initial provider. Patient had general blood work which showed mild hyponatremia 130, mild chronic anemia 11.3. Urinalysis no sign of infection. CT chest abdomen pelvis with lymphoma which is known and old compression deformities. Patient was observed in the emergency room and social work assisted which was appreciated and placement at [employer]. On exam patient is generally weak, frail, no respiratory difficulties, afebrile. Patient has normal oxygenation and blood pressure normalized in the emergency room. Patient is stable for discharge/transfer to [facility]. Her home meds will be continued. COVID neg, reviewed.  albuterol (VENTOLIN HFA) 108 (90 Base) MCG/ACT inhaler Inhale 2 puffs into the lungs every 4 (four) hours as needed for wheezing or shortness of breath. Corena Herter, PA-C Reordered  Ordered as: albuterol (VENTOLIN HFA) 108 (90 Base) MCG/ACT inhaler 2 puff - 2 puff, Inhalation, Every 4 hours PRN, wheezing, shortness of breath, Starting on Thu 08/31/20 at 1751  ALPRAZolam (XANAX) 0.5 MG tablet Take 1-2 tablets (0.5-1 mg total) by mouth at bedtime as needed for anxiety. Corena Herter, PA-C Reordered  Ordered as: ALPRAZolam Duanne Moron) tablet 0.5-1 mg - 0.5-1 mg, Oral, At bedtime PRN, anxiety, Starting on Thu 08/31/20 at 1751  Ascorbic Acid (VITAMIN C) 1000 MG tablet Take 1,000 mg by mouth daily. Corena Herter, PA-C Reordered  Ordered as: ascorbic acid (VITAMIN C) tablet 1,000 mg - 1,000 mg, Oral, Daily, First dose on Thu 08/31/20 at 1800  cyclobenzaprine (FLEXERIL) 10 MG tablet Take 10 mg by mouth 3 (three) times daily as needed for muscle spasms.  Corena Herter, PA-C Reordered  Ordered as: cyclobenzaprine (FLEXERIL) tablet 10 mg - 10 mg, Oral, 3 times daily PRN, muscle spasms,  Starting on Thu 08/31/20 at 1752  dicyclomine (BENTYL) 10 MG capsule TAKE 1 CAPSULE FOUR TIMES DAILY AS NEEDED FOR SEVERE PAIN Corena Herter, PA-C Reordered   Patient taking differently: Take 10 mg by mouth 4 (four) times daily as needed (severe pain).     Ordered as: dicyclomine (BENTYL) capsule 10 mg - 10 mg, Oral, 3 times daily PRN, spasms, Starting on Thu 08/31/20 at 1752  levothyroxine (SYNTHROID) 25 MCG tablet Take 2 tablets (50 mcg total) by mouth daily before breakfast. Corena Herter, PA-C Reordered  Ordered as: levothyroxine (SYNTHROID) tablet 50 mcg - 50 mcg, Oral, Daily before breakfast, First dose on Fri 09/01/20 at 0800  LINZESS 145 MCG CAPS capsule TAKE 1 CAPSULE DAILY TO REGULATE BOWEL MOVEMENTS Corena Herter, PA-C Reordered   Patient taking differently: Take 145 mcg by mouth daily.     Ordered as: linaclotide (LINZESS) capsule 145 mcg - 145 mcg, Oral, Daily before breakfast, First dose on Fri 09/01/20 at 0800  metoprolol tartrate (LOPRESSOR) 25 MG tablet TAKE 2 TABLETS IN THE MORNING, AND 1 TABLET IN THE EVENING Corena Herter, PA-C Reordered   Patient taking differently: Take 25-50 mg by mouth See admin instructions. TAKE 2 TABLETS IN THE MORNING, AND 1 TABLET IN THE EVENING    Ordered as: metoprolol tartrate (LOPRESSOR) tablet 25 mg - 25 mg, Oral, 2 times daily, First dose on Thu 08/31/20 at 2200  omega-3 acid ethyl esters (LOVAZA) 1 g capsule Take 2 capsules (2 g total) by mouth 2 (two) times daily. Corena Herter, PA-C Reordered  Ordered as: omega-3 acid ethyl esters (LOVAZA) capsule  2 g - 2 g, Oral, 2 times daily, First dose on Thu 08/31/20 at 2200  rosuvastatin (CRESTOR) 20 MG tablet TAKE 1 TABLET ONCE DAILY Corena Herter, PA-C Reordered   Patient taking differently: Take 20 mg by mouth at bedtime.     Ordered as: rosuvastatin (CRESTOR) tablet 20 mg - 20 mg, Oral, Daily, First dose on Thu 08/31/20 at 2100  sertraline (ZOLOFT) 100 MG tablet Take 1 tablet (100 mg  total) by mouth daily. Corena Herter, PA-C Reordered  Ordered as: sertraline (ZOLOFT) tablet 100 mg - 100 mg, Oral, Daily, First dose on Thu 08/31/20 at 1800  XARELTO 20 MG TABS tablet TAKE 1 TABLET ONCE DAILY Corena Herter, PA-C Not Ordered   Patient taking differently: Take 20 mg by mouth daily with supper.      General Weakness Golda Acre, MD 09/02/20 [phone]

## 2020-09-02 NOTE — TOC Transition Note (Signed)
Transition of Care [facility]) - CM/SW Discharge Note  Patient Details  Name: STARLETTE THUROW MRN: [id number] Date of Birth: [birth year]  [facility] CM/SW Contact:  Sherie Don, LCSW Phone Number: 09/02/2020, 10:47 AM  Clinical Narrative: CSW confirmed with [employer] authorization for SNF has been approved for today. Patient's Bernadene Bell ID # is: [id number]; her plan ID is: [id number]. Patient's next review date is 09/05/20. Additional clinicals can be faxed to [phone]. [employer] representative is [employer], who can be reached at [phone].  CSW faxed discharge summary, discharge orders, FL2, and PT notes to [facility]. Patient will go to room B18-2. The number to call for report is [phone]. RN updated. CSW left voicemail for patient's son regarding transfer to SNF. TOC signing off.  Final next level of care: [place] Barriers to Discharge: ED Barriers Resolved  Patient Goals and CMS Choice Patient states their goals for this hospitalization and ongoing recovery are:: Discharge to [facility] for rehab CMS Medicare.gov Compare Post Acute Care list provided to:: Patient Represenative (must comment) Malka So (son)) Choice offered to / list presented to : Adult Children  Discharge Placement        Patient chooses bed at: [facility] Patient to be transferred to facility by: [facility] Name of family member notified: Malka So Patient and family notified of of transfer: 09/02/20  Discharge Plan and Services In-house Referral: Clinical Social Work Discharge Planning Services: NA        DME Arranged: N/A DME Agency: NA HH Arranged: NA HH Agency: NA  Readmission Risk Interventions No flowsheet data found.

## 2020-09-02 NOTE — ED Notes (Signed)
Report given to staff; ems here to transport pt

## 2020-09-07 DIAGNOSIS — C8590 Non-Hodgkin lymphoma, unspecified, unspecified site: Secondary | ICD-10-CM | POA: Diagnosis not present

## 2020-09-07 DIAGNOSIS — G8929 Other chronic pain: Secondary | ICD-10-CM | POA: Diagnosis not present

## 2020-09-07 DIAGNOSIS — S22000A Wedge compression fracture of unspecified thoracic vertebra, initial encounter for closed fracture: Secondary | ICD-10-CM | POA: Diagnosis not present

## 2020-09-07 DIAGNOSIS — M545 Low back pain: Secondary | ICD-10-CM | POA: Diagnosis not present

## 2020-09-07 DIAGNOSIS — K219 Gastro-esophageal reflux disease without esophagitis: Secondary | ICD-10-CM | POA: Diagnosis not present

## 2020-09-08 DIAGNOSIS — R531 Weakness: Secondary | ICD-10-CM | POA: Diagnosis not present

## 2020-09-08 DIAGNOSIS — C8590 Non-Hodgkin lymphoma, unspecified, unspecified site: Secondary | ICD-10-CM | POA: Diagnosis not present

## 2020-09-09 ENCOUNTER — Other Ambulatory Visit: Payer: Self-pay | Admitting: Family Medicine

## 2020-09-18 DIAGNOSIS — J449 Chronic obstructive pulmonary disease, unspecified: Secondary | ICD-10-CM | POA: Diagnosis not present

## 2020-09-18 DIAGNOSIS — S22000A Wedge compression fracture of unspecified thoracic vertebra, initial encounter for closed fracture: Secondary | ICD-10-CM | POA: Diagnosis not present

## 2020-09-18 DIAGNOSIS — G8929 Other chronic pain: Secondary | ICD-10-CM | POA: Diagnosis not present

## 2020-09-18 DIAGNOSIS — C8590 Non-Hodgkin lymphoma, unspecified, unspecified site: Secondary | ICD-10-CM | POA: Diagnosis not present

## 2020-09-18 DIAGNOSIS — K219 Gastro-esophageal reflux disease without esophagitis: Secondary | ICD-10-CM | POA: Diagnosis not present

## 2020-09-25 ENCOUNTER — Other Ambulatory Visit: Payer: Self-pay | Admitting: Cardiology

## 2020-09-28 ENCOUNTER — Encounter: Payer: Self-pay | Admitting: Family

## 2020-09-28 ENCOUNTER — Ambulatory Visit (INDEPENDENT_AMBULATORY_CARE_PROVIDER_SITE_OTHER): Payer: Medicare Other | Admitting: Family

## 2020-09-28 DIAGNOSIS — Y92009 Unspecified place in unspecified non-institutional (private) residence as the place of occurrence of the external cause: Secondary | ICD-10-CM

## 2020-09-28 DIAGNOSIS — R21 Rash and other nonspecific skin eruption: Secondary | ICD-10-CM | POA: Diagnosis not present

## 2020-09-28 DIAGNOSIS — W19XXXA Unspecified fall, initial encounter: Secondary | ICD-10-CM

## 2020-09-28 DIAGNOSIS — R531 Weakness: Secondary | ICD-10-CM

## 2020-09-28 NOTE — Progress Notes (Signed)
   Virtual Visit via telephone Note Due to COVID-19 pandemic this visit was conducted virtually. This visit type was conducted due to national recommendations for restrictions regarding the COVID-19 Pandemic (e.g. social distancing, sheltering in place) in an effort to limit this patient's exposure and mitigate transmission in our community. All issues noted in this document were discussed and addressed.  A physical exam was not performed with this format.  I connected with [name] on 09/28/20 at 10:34 AM  by telephone and verified that I am speaking with the correct person using two identifiers. [name] is currently located at home and no one is currently with her during visit. The provider, Evelina Dun, FNP is located in their office at time of visit.  I discussed the limitations, risks, security and privacy concerns of performing an evaluation and management service by telephone and the availability of in person appointments. I also discussed with the patient that there may be a patient responsible charge related to this service. The patient expressed understanding and agreed to proceed.   History and Present Illness:  Pt calls the office today with complaint of rash that she noticed in July and thought was it was related to Buprenorphine patch. She states this was on right arm for a week and when she removed it noticed a rash on her left upper arm and inner thighs. She has some Kenalog 0.1%  cream, but has started using that. She states she will try to use it today.   She reports she is having weakness and reports she was standing in her kitchen and had to lower herself to the floor. She called EMS and they helped her up. She denies any injury, and just having bilateral leg weakness. Denies hitting her head or loss of consciousness.   She has chronic pain. She currently enrolled hospice and had her first visit last week. Rash This is a new problem. The current episode started in the  past 7 days. The problem is unchanged. The affected locations include the left upper leg, right arm, right upper leg and left arm.      Review of Systems  Skin: Positive for rash.     Observations/Objective: No SOB or distress noted, weakness noted  Assessment and Plan: 1. Rash and nonspecific skin eruption I doubt this is related to her medication given she has not had this since July. Avoid scratching Keep clean and dry Start kenalog cream BID  2. Weakness  3. Fall in home, initial encounter Fall precautions discussed Keep appts with hospice     I discussed the assessment and treatment plan with the patient. The patient was provided an opportunity to ask questions and all were answered. The patient agreed with the plan and demonstrated an understanding of the instructions.   The patient was advised to call back or seek an in-person evaluation if the symptoms worsen or if the condition fails to improve as anticipated.  The above assessment and management plan was discussed with the patient. The patient verbalized understanding of and has agreed to the management plan. Patient is aware to call the clinic if symptoms persist or worsen. Patient is aware when to return to the clinic for a follow-up visit. Patient educated on when it is appropriate to go to the emergency department.   Time call ended:  10:51 AM  I provided 16 minutes of non-face-to-face time during this encounter.    Evelina Dun, FNP

## 2020-09-29 ENCOUNTER — Telehealth: Payer: Self-pay | Admitting: *Deleted

## 2020-09-29 MED ORDER — NITROGLYCERIN 0.4 MG SL SUBL: 0.4000 mg | SUBLINGUAL_TABLET | SUBLINGUAL | 0 refills | Status: DC | PRN

## 2020-09-29 NOTE — Telephone Encounter (Signed)
Number not a working number- I tried it several times. Do you have another number ?

## 2020-09-29 NOTE — Telephone Encounter (Signed)
TC w/ [name] from Hospice Pt had a controlled fall yesterday, her legs gave out, she held onto wall lowering herself down, but had to call EMS to get her off the floor. She stayed in her wheelchair for the rest of the day. She did not hit her head or any other injury. She c/o back pain. Has taken Xtra strength Tylenol with no help. Does not want a narcotic which is on the Hospice standing order. Has had Tramadol in the past, 50 mg 1 Q8 hr, can we send this into [place].  Needed RF on Nitroglycerin, this has been RFd

## 2020-09-29 NOTE — Telephone Encounter (Signed)
And looks like on the database that she has been prescribed buprenorphine, if she is currently taking buprenorphine that we cannot do tramadol.  I do not know if she is currently taking it or not, would have to call hospice to see.

## 2020-10-02 MED ORDER — NITROGLYCERIN 0.4 MG SL SUBL: 0.4000 mg | SUBLINGUAL_TABLET | SUBLINGUAL | 0 refills | Status: AC | PRN

## 2020-10-02 NOTE — Telephone Encounter (Signed)
LMOVM to April regarding Dr. Merita Norton note about the Buprenorphine. To let us know if patient is taking this or not. If she is than Tramadol can not be prescribed.

## 2020-10-02 NOTE — Telephone Encounter (Signed)
E-prescribe down. resent 

## 2020-10-02 NOTE — Addendum Note (Signed)
Addended by: Antonietta Barcelona D on: 10/02/2020 09:15 AM   Modules accepted: Orders

## 2020-10-03 ENCOUNTER — Telehealth: Payer: Self-pay

## 2020-10-03 ENCOUNTER — Other Ambulatory Visit: Payer: Self-pay | Admitting: Family Medicine

## 2020-10-03 MED ORDER — INFLUENZA VAC SPLIT HIGH-DOSE 0.5 ML IM SUSY: 0.5000 mL | PREFILLED_SYRINGE | Freq: Once | INTRAMUSCULAR | 0 refills | Status: AC

## 2020-10-03 MED ORDER — TRAMADOL HCL 50 MG PO TABS: 50.0000 mg | ORAL_TABLET | Freq: Two times a day (BID) | ORAL | 0 refills | Status: DC | PRN

## 2020-10-03 NOTE — Telephone Encounter (Signed)
She should have standing hospice orders for pain control.  Ok to give Tramadol 50mg  q12 hours prn pain.

## 2020-10-03 NOTE — Telephone Encounter (Signed)
If patient is having new onset or worsening symptoms since fall, recommend evaluation in ED.  Would not delay care.  I'm a bit confused, if she is in hospice care not sure why should would be referred to several specialists.  This is not the typical course for patients that are in hospice care.  Is she not comfort care?  If she has URI symptoms, again, I was under the impression hospice had standing orders for cough meds/ etc.  Am I mistaken??

## 2020-10-03 NOTE — Telephone Encounter (Signed)
Please send Rx to [facility]

## 2020-10-03 NOTE — Telephone Encounter (Signed)
TC from [name] w/ Hospice Would like to do an MRI, lots of back pain fell between toilet & cabinet, EMS had to come & help her up again, she has had a lot of weakness in her legs, She feels like she has pins in her feet from her back - neuropathy maybe some gabapentin. Also wonder about referral to neurologist for back, maybe after the MRI Pt had cough, ST, head congestion, has shakes in the am - COVID test was negative  [name] is off on Wednesday, may call the Hospice office at [phone] and ask for Ron

## 2020-10-03 NOTE — Telephone Encounter (Signed)
TC from April w/ Hospice Pt is not using the Buprenorphine patch, it left a rash on her arm, she stopped it a couple of weeks ago. She has several falls over the weekend. Her pain is not controlled w/ the Tylenol. Is barely able to walk through the house without her legs giving out. Can the Tramadol be prescribed.

## 2020-10-04 NOTE — Telephone Encounter (Signed)
lmtcb

## 2020-10-09 ENCOUNTER — Other Ambulatory Visit: Payer: Self-pay | Admitting: Family Medicine

## 2020-10-09 DIAGNOSIS — F411 Generalized anxiety disorder: Secondary | ICD-10-CM

## 2020-10-12 ENCOUNTER — Telehealth: Payer: Self-pay | Admitting: Family Medicine

## 2020-10-12 NOTE — Telephone Encounter (Signed)
Pt states that dr g was supposed to refer her to a neurologist and that she has not been referred. Said she was also out of her xanax but was unable to come in for an appointment, she is aware that she is supposed to have a visit prior to getting her med refill but was insistent about asking about getting another refill. I have scheduled her for a video visit 12/14

## 2020-10-12 NOTE — Telephone Encounter (Signed)
Patient is needing a referral for neurologist. Patient states that she knows you know that she cold not come in because her back pain. She was advised she would have to be seen for the xanax. Aware you are not back in until tomorrow.

## 2020-10-13 NOTE — Telephone Encounter (Signed)
Patient aware.

## 2020-10-13 NOTE — Telephone Encounter (Signed)
Please see 10/03/20 telephone note.  I was under the impression she is under hospice care?  They should be handling her meds, including xanax.

## 2020-10-13 NOTE — Telephone Encounter (Signed)
lmtcb

## 2020-10-18 DIAGNOSIS — R5381 Other malaise: Secondary | ICD-10-CM | POA: Diagnosis not present

## 2020-10-18 DIAGNOSIS — W19XXXA Unspecified fall, initial encounter: Secondary | ICD-10-CM | POA: Diagnosis not present

## 2020-11-01 ENCOUNTER — Other Ambulatory Visit (HOSPITAL_COMMUNITY)
Admission: RE | Admit: 2020-11-01 | Discharge: 2020-11-01 | Disposition: A | Discharge status: Home / Self Care | Payer: Medicare Other | Source: Skilled Nursing Facility | Attending: Family Medicine | Admitting: Family Medicine

## 2020-11-01 DIAGNOSIS — R350 Frequency of micturition: Secondary | ICD-10-CM | POA: Insufficient documentation

## 2020-11-01 LAB — URINALYSIS, COMPLETE (UACMP) WITH MICROSCOPIC
Bilirubin Urine: NEGATIVE
Glucose, UA: NEGATIVE mg/dL
Hgb urine dipstick: NEGATIVE
Ketones, ur: NEGATIVE mg/dL
Leukocytes,Ua: NEGATIVE
Nitrite: NEGATIVE
Protein, ur: NEGATIVE mg/dL
Specific Gravity, Urine: 1.009 (ref 1.005–1.030)
pH: 7 (ref 5.0–8.0)

## 2020-11-02 LAB — URINE CULTURE: Culture: NO GROWTH

## 2020-11-06 ENCOUNTER — Other Ambulatory Visit: Payer: Self-pay | Admitting: Family Medicine

## 2020-11-06 DIAGNOSIS — K5909 Other constipation: Secondary | ICD-10-CM

## 2020-11-14 DIAGNOSIS — M47816 Spondylosis without myelopathy or radiculopathy, lumbar region: Secondary | ICD-10-CM | POA: Diagnosis not present

## 2020-11-23 ENCOUNTER — Other Ambulatory Visit: Payer: Self-pay

## 2020-11-23 DIAGNOSIS — I480 Paroxysmal atrial fibrillation: Secondary | ICD-10-CM

## 2020-11-24 ENCOUNTER — Other Ambulatory Visit: Payer: Self-pay | Admitting: Cardiology

## 2020-11-24 ENCOUNTER — Other Ambulatory Visit: Payer: Self-pay | Admitting: Family Medicine

## 2020-11-27 ENCOUNTER — Telehealth: Payer: Self-pay

## 2020-11-27 NOTE — Telephone Encounter (Signed)
   Primary Cardiologist: Minus Breeding, MD  Chart reviewed as part of pre-operative protocol coverage. We have been asked for guidance regarding xarelto prior to surgery.   Per our clinical pharmacist: Patient with diagnosis of atrial fibrillation on Xarelto for anticoagulation.    Procedure: bilateral facet injection Date of procedure: 12/05/20    CHA2DS2-VASc Score = 6  This indicates a 9.7% annual risk of stroke. The patient's score is based upon: CHF History: Yes HTN History: Yes Diabetes History: No Stroke History: No Vascular Disease History: Yes Age Score: 2 Gender Score: 1  CrCl 50.7 Platelet count 173  Per office protocol, patient can hold Xarelto for 3 days prior to procedure.   Patient will not need bridging with Lovenox (enoxaparin) around procedure.  I will route this recommendation to the requesting party via Epic fax function and remove from pre-op pool. Please call with questions.  [place], PA 11/27/2020, 2:14 PM

## 2020-11-27 NOTE — Telephone Encounter (Signed)
° °  [facility] Pre-operative Risk Assessment      Request for surgical clearance:  1. What type of surgery is being performed Bilateral facet injection  2. When is this surgery scheduled  12/05/20  3. What type of clearance is required  Pharmacy  4. Are there any medications that need to be held prior to surgery and how long Xarelto hold 3 days before  5. Practice name and name of physician performing surgery [facility]  6. What is the office phone number [phone] ext [phone]   7.   What is the office fax number [phone]  8.   Anesthesia type  None listed   Kathyrn Lass 11/27/2020, 12:21 PM  _________________________________________________________________   (provider comments below)

## 2020-11-27 NOTE — Telephone Encounter (Signed)
Patient with diagnosis of atrial fibrillation on Xarelto for anticoagulation.    Procedure: bilateral facet injection Date of procedure: 12/05/20    CHA2DS2-VASc Score = 6  This indicates a 9.7% annual risk of stroke. The patient's score is based upon: CHF History: Yes HTN History: Yes Diabetes History: No Stroke History: No Vascular Disease History: Yes Age Score: 2 Gender Score: 1  CrCl 50.7 Platelet count 173  Per office protocol, patient can hold Xarelto for 3 days prior to procedure.   Patient will not need bridging with Lovenox (enoxaparin) around procedure.

## 2020-11-28 ENCOUNTER — Telehealth: Payer: Medicare Other | Admitting: Family Medicine

## 2020-12-05 DIAGNOSIS — M47816 Spondylosis without myelopathy or radiculopathy, lumbar region: Secondary | ICD-10-CM | POA: Diagnosis not present

## 2020-12-06 ENCOUNTER — Other Ambulatory Visit: Payer: Self-pay | Admitting: Family Medicine

## 2020-12-06 DIAGNOSIS — K5909 Other constipation: Secondary | ICD-10-CM

## 2020-12-19 DIAGNOSIS — M25512 Pain in left shoulder: Secondary | ICD-10-CM | POA: Diagnosis not present

## 2020-12-19 DIAGNOSIS — M25511 Pain in right shoulder: Secondary | ICD-10-CM | POA: Diagnosis not present

## 2020-12-21 ENCOUNTER — Telehealth: Payer: Self-pay

## 2020-12-21 NOTE — Telephone Encounter (Signed)
I connected by phone with [name] and/or patient's caregiver on 12/21/2020 at 2:00 PM to discuss the potential vaccination through our Homebound vaccination initiative.   Prevaccination Checklist for COVID-19 Vaccines  1.  Are you feeling sick today? no  2.  Have you ever received a dose of a COVID-19 vaccine?  yes      If yes, which one? Moderna   How many dose of Covid-19 vaccine have your received and dates ? Moderna 02/15/20, [place] 03/14/20; both recorded on vaccine card   Check all that apply: I live in a long-term care setting. no  I have been diagnosed with a medical condition(s). Please list: terminal cancer, Hospice, mobility issues (pertinent to homebound status)  I am a first responder. no  I work in a long-term care facility, correctional facility, hospital, restaurant, retail setting, school, or other setting with high exposure to the public. no  4. Do you have a health condition or are you undergoing treatment that makes you moderately or severely immunocompromised? (This would include treatment for cancer or HIV, receipt of organ transplant, immunosuppressive therapy or high-dose corticosteroids, CAR-T-cell therapy, hematopoietic cell transplant [HCT], DiGeorge syndrome or Wiskott-Aldrich syndrome)  no  5. Have you received hematopoietic cell transplant (HCT) or CAR-T-cell therapies since receiving COVID-19 vaccine? no  6.  Have you ever had an allergic reaction: (This would include a severe reaction [ e.g., anaphylaxis] that required treatment with epinephrine or EpiPen or that caused you to go to the hospital.  It would also include an allergic reaction that occurred within 4 hours that caused hives, swelling, or respiratory distress, including wheezing.) A.  A previous dose of COVID-19 vaccine. no  B.  A vaccine or injectable therapy that contains multiple components, one of which is a COVID-19 vaccine component, but it is not known which component elicited the immediate  reaction. no  C.  Are you allergic to polyethylene glycol? no  D. Are you allergic to Polysorbate, which is found in some vaccines, film coated tablets and intravenous steroids?  no   7.  Have you ever had an allergic reaction to another vaccine (other than COVID-19 vaccine) or an injectable medication? (This would include a severe reaction [ e.g., anaphylaxis] that required treatment with epinephrine or EpiPen or that caused you to go to the hospital.  It would also include an allergic reaction that occurred within 4 hours that caused hives, swelling, or respiratory distress, including wheezing.)  no   8.  Have you ever had a severe allergic reaction (e.g., anaphylaxis) to something other than a component of the COVID-19 vaccine, or any vaccine or injectable medication?  This would include food, pet, venom, environmental, or oral medication allergies.  no   Check all that apply to you:  Am a female between ages 7 and 2 years old  no  Women 66 through 83 years of age can receive any FDA-authorized or -approved COVID-19 vaccine. However, they should be informed of the rare but increased risk of thrombosis with thrombocytopenia syndrome (TTS) after receipt of the [employer] Vaccine and the availability of other FDA-authorized and -approved COVID-19 vaccines. People who had TTS after a first dose of Janssen vaccine should not receive a subsequent dose of Janssen product    Am a female between ages 25 and 69 years old  no Males 5 through 83 years of age may receive the correct formulation of Pfizer-BioNTech COVID-19 vaccine. Males 104 and older can receive any FDA-authorized or -approved  vaccine. However, people receiving an mRNA COVID-19 vaccine, especially males 12 through 83 years of age and their parents/legal representative (when relevant), should be informed of the risk of developing myocarditis (an inflammation of the heart muscle) or pericarditis (inflammation of the lining around the heart)  after receipt of an mRNA vaccine. The risk of developing either myocarditis or pericarditis after vaccination is low, and lower than the risk of myocarditis associated with SARS-CoV-2 infection in adolescents and adults. Vaccine recipients should be counseled about the need to seek care if symptoms of myocarditis or pericarditis develop after vaccination     Have a history of myocarditis or pericarditis  no Myocarditis or pericarditis after receipt of the first dose of an mRNA COVID-19 vaccine series but before administration of the second dose  Experts advise that people who develop myocarditis or pericarditis after a dose of an mRNA COVID-19 vaccine not receive a subsequent dose of any COVID-19 vaccine, until additional safety data are available.  Administration of a subsequent dose of COVID-19 vaccine before safety data are available can be considered in certain circumstances after the episode of myocarditis or pericarditis has completely resolved. Until additional data are available, some experts recommend a [employer] COVID-19 vaccine be considered instead of an mRNA COVID-19 vaccine. Decisions about proceeding with a subsequent dose should include a conversation between the patient, their parent/legal representative (when relevant), and their clinical team, which may include a cardiologist.    Have been treated with monoclonal antibodies or convalescent serum to prevent or treat COVID-19  no Vaccination should be offered to people regardless of history of prior symptomatic or asymptomatic SARS-CoV-2 infection. There is no recommended minimal interval between infection and vaccination.  However, vaccination should be deferred if a patient received monoclonal antibodies or convalescent serum as treatment for COVID-19 or for post-exposure prophylaxis. This is a precautionary measure until additional information becomes available, to avoid interference of the antibody treatment with vaccine-induced immune  responses.  Defer COVID-19 vaccination for 30 days when a passive antibody product was used for post-exposure prophylaxis.  Defer COVID-19 vaccination for 90 days when a passive antibody product was used to treat COVID-19.     Diagnosed with Multisystem Inflammatory Syndrome (MIS-C or MIS-A) after a COVID-19 infection  no It is unknown if people with a history of MIS-C or MIS-A are at risk for a dysregulated immune response to COVID-19 vaccination.  People with a history of MIS-C or MIS-A may choose to be vaccinated. Considerations for vaccination may include:   Clinical recovery from MIS-C or MIS-A, including return to normal cardiac function   Personal risk of severe acute COVID-19 (e.g., age, underlying conditions)   High or substantial community transmission of SARS-CoV-2 and personal increased risk of reinfection.   Timing of any immunomodulatory therapies (general best practice guidelines for immunization can be consulted for more information [employer])   It has been 90 days or more since their diagnosis of MIS-C   Onset of MIS-C occurred before any COVID-19 vaccination   A conversation between the patient, their guardian(s), and their clinical team or a specialist may assist with COVID-19 vaccination decisions. Healthcare providers and health departments may also request a consultation from the Clinical Immunization Safety Assessment Project at [employer] [email or web address]/cisa/index.html.     Have a bleeding disorder  no Take a blood thinner  yes As with all vaccines, any COVID-19 vaccine product may be given to these patients, if a physician familiar with the patient's bleeding risk determines that the vaccine  can be administered intramuscularly with reasonable safety.  ACIP recommends the following technique for intramuscular vaccination in patients with bleeding disorders or taking blood thinners: a  fine-gauge needle (23-gauge or smaller caliber) should be used for the vaccination, followed by firm pressure on the site, without rubbing, for at least 2 minutes.  People who regularly take aspirin or anticoagulants as part of their routine medications do not need to stop these medications prior to receipt of any COVID-19 vaccine.    Have a history of heparin-induced thrombocytopenia (HIT)  no Although the etiology of TTS associated with the Alphonsa Overall COVID-19 vaccine is unclear, it appears to be similar to another rare immune-mediated syndrome, heparin-induced thrombocytopenia (HIT). People with a history of an episode of an immune-mediated syndrome characterized by thrombosis and thrombocytopenia, such as HIT, should be offered a currently FDA-approved or FDA-authorized mRNA COVID-19 vaccine if it has been ?90 days since their TTS resolved. After 90 days, patients may be vaccinated with any currently FDA-approved or FDA-authorized COVID-19 vaccine, including Janssen COVID-19 Vaccine. However, people who developed TTS after their initial Alphonsa Overall vaccine should not receive a Janssen booster dose.  Experts believe the following factors do not make people more susceptible to TTS after receipt of the [employer]. People with these conditions can be vaccinated with any FDA-authorized or - approved COVID-19 vaccine, including the [employer] COVID-19 Vaccine:   A prior history of venous thromboembolism   Risk factors for venous thromboembolism (e.g., inherited or acquired thrombophilia including Factor V Leiden; prothrombin gene 20210A mutation; antiphospholipid syndrome; protein C, protein S or antithrombin deficiency   A prior history of other types of thromboses not associated with thrombocytopenia   Pregnancy, post-partum status, or receipt of hormonal contraceptives (e.g., combined oral contraceptives, patch, ring)   Additional recipient education materials can be found at  http://gutierrez-robinson.com/ vaccines/safety/JJUpdate.html.    Am currently pregnant or breastfeeding  no Vaccination is recommended for all people aged 99 years and older, including people that are:   Pregnant   Breastfeeding   Trying to get pregnant now or who might become pregnant in the future   Pregnant, breastfeeding, and post-partum people 42 through 83 years of age should be aware of the rare risk of TTS after receipt of the Alphonsa Overall COVID-19 Vaccine and the availability of other FDA-authorized or -approved COVID-19 vaccines (i.e., mRNA vaccines).    Have received dermal fillers  no FDA-authorized or -approved COVID-19 vaccines can be administered to people who have received injectable dermal fillers who have no contraindications for vaccination.  Infrequently, these people might experience temporary swelling at or near the site of filler injection (usually the face or lips) following administration of a dose of an mRNA COVID-19 vaccine. These people should be advised to contact their healthcare provider if swelling develops at or near the site of dermal filler following vaccination.     Have a history of Guillain-Barr Syndrome (GBS)  no People with a history of GBS can receive any FDA-authorized or -approved COVID-19 vaccine. However, given the possible association between the [employer] and an increased risk of GBS, a patient with a history of GBS and their clinical team should discuss the availability of mRNA vaccines to offer protection against COVID-19. The highest risk has been observed in men aged 106-64 years with symptoms of GBS beginning within 42 days after Alphonsa Overall COVID-19 vaccination.  People who had GBS after receiving Janssen vaccine should be made aware of the option to receive an mRNA COVID-19 vaccine  booster at least 2 months (8 weeks) after the Janssen dose. However, Alphonsa Overall vaccine may be used as a booster, particularly if GBS occurred more than 42 days  after vaccination or was related to a non-vaccine factor. Prior to booster vaccination, a conversation between the patient and their clinical team may assist with decisions about use of a COVID-19 booster dose, including the timing of administration     Postvaccination Observation Times for People without Contraindications to Covid 19 Vaccination.  30 minutes:  People with a history of: A contraindication to another type of COVID-19 vaccine product (i.e., mRNA or viral vector COVID-19 vaccines)   Immediate (within 4 hours of exposure) non-severe allergic reaction to a COVID-19 vaccine or injectable therapies   Anaphylaxis due to any cause   Immediate allergic reaction of any severity to a non-COVID-19 vaccine   15 minutes: All other people  This patient is a 85 y.o. female that meets the FDA criteria to receive homebound vaccination. Patient or parent/caregiver understands they have the option to accept or refuse homebound vaccination.  Patient passed the pre-screening checklist and would like to proceed with homebound vaccination.  Based on questionnaire above, I recommend the patient be observed for 15 minutes.  There are an estimated #0 other household members/caregivers who are also interested in receiving the vaccine.    The patient has been confirmed homebound and eligible for homebound vaccination with the considerations outlined above. I will send the patient's information to our scheduling team who will reach out to schedule the patient and potential caregiver/family members for homebound vaccination.    Ermalene Postin 12/21/2020 2:25 PM

## 2020-12-27 DIAGNOSIS — M25511 Pain in right shoulder: Secondary | ICD-10-CM | POA: Diagnosis not present

## 2020-12-27 DIAGNOSIS — M25512 Pain in left shoulder: Secondary | ICD-10-CM | POA: Diagnosis not present

## 2021-01-04 ENCOUNTER — Other Ambulatory Visit: Payer: Self-pay | Admitting: Family Medicine

## 2021-01-04 DIAGNOSIS — K5909 Other constipation: Secondary | ICD-10-CM

## 2021-01-15 ENCOUNTER — Telehealth: Payer: Self-pay

## 2021-01-18 ENCOUNTER — Other Ambulatory Visit: Payer: Self-pay | Admitting: Family Medicine

## 2021-01-30 ENCOUNTER — Ambulatory Visit: Payer: Medicare Other | Attending: Critical Care Medicine

## 2021-01-30 DIAGNOSIS — Z23 Encounter for immunization: Secondary | ICD-10-CM

## 2021-01-30 NOTE — Progress Notes (Signed)
   Covid-19 Vaccination Clinic  Name:  [name]    MRN: [id number] DOB: 06-10-1936  01/30/2021  Ms. Puccini was observed post Covid-19 immunization for 15 min without incident. She was provided with Vaccine Information Sheet and instruction to access the V-Safe system.   Ms. Coover was instructed to call 911 with any severe reactions post vaccine: Marland Kitchen Difficulty breathing  . Swelling of face and throat  . A fast heartbeat  . A bad rash all over body  . Dizziness and weakness   Immunizations Administered    Name Date Dose VIS Date Route   Moderna Covid-19 Booster Vaccine 01/30/2021 12:18 AM 0.25 mL 10/04/2020 Intramuscular   Manufacturer: Moderna   Lot: [id number]   [place]: [id number]

## 2021-02-03 ENCOUNTER — Other Ambulatory Visit: Payer: Self-pay | Admitting: Family Medicine

## 2021-02-03 DIAGNOSIS — K5909 Other constipation: Secondary | ICD-10-CM

## 2021-02-05 DIAGNOSIS — M25512 Pain in left shoulder: Secondary | ICD-10-CM | POA: Diagnosis not present

## 2021-02-05 NOTE — Telephone Encounter (Signed)
[place]. NTBS 30 days given 01/05/21

## 2021-02-21 ENCOUNTER — Other Ambulatory Visit: Payer: Self-pay | Admitting: Family Medicine

## 2021-02-22 ENCOUNTER — Other Ambulatory Visit: Payer: Self-pay | Admitting: Family Medicine

## 2021-02-22 DIAGNOSIS — K5909 Other constipation: Secondary | ICD-10-CM

## 2021-02-22 NOTE — Telephone Encounter (Signed)
[place]. NTBS 30 days given 01/05/21

## 2021-02-22 NOTE — Telephone Encounter (Signed)
[place]. NTBSf 30 days given 01/05/21

## 2021-02-23 ENCOUNTER — Other Ambulatory Visit: Payer: Self-pay | Admitting: Family Medicine

## 2021-02-23 DIAGNOSIS — K5909 Other constipation: Secondary | ICD-10-CM

## 2021-02-27 ENCOUNTER — Other Ambulatory Visit: Payer: Self-pay | Admitting: Family Medicine

## 2021-02-28 DIAGNOSIS — R3915 Urgency of urination: Secondary | ICD-10-CM | POA: Diagnosis not present

## 2021-03-05 ENCOUNTER — Other Ambulatory Visit: Payer: Self-pay | Admitting: Family Medicine

## 2021-03-21 ENCOUNTER — Other Ambulatory Visit: Payer: Self-pay

## 2021-03-21 ENCOUNTER — Other Ambulatory Visit: Payer: Self-pay | Admitting: Cardiology

## 2021-03-21 ENCOUNTER — Other Ambulatory Visit: Payer: Self-pay | Admitting: Family Medicine

## 2021-03-21 ENCOUNTER — Encounter (INDEPENDENT_AMBULATORY_CARE_PROVIDER_SITE_OTHER): Payer: Medicare Other | Admitting: Ophthalmology

## 2021-03-21 ENCOUNTER — Encounter (INDEPENDENT_AMBULATORY_CARE_PROVIDER_SITE_OTHER): Payer: Self-pay

## 2021-03-21 NOTE — Telephone Encounter (Signed)
LM for pt to call and schedule appt. °

## 2021-03-21 NOTE — Telephone Encounter (Signed)
[place]. NTBS 30 days given 02/22/21

## 2021-03-22 ENCOUNTER — Encounter (INDEPENDENT_AMBULATORY_CARE_PROVIDER_SITE_OTHER): Payer: Medicare Other | Admitting: Ophthalmology

## 2021-03-23 ENCOUNTER — Other Ambulatory Visit: Payer: Self-pay | Admitting: Family Medicine

## 2021-03-28 ENCOUNTER — Encounter (INDEPENDENT_AMBULATORY_CARE_PROVIDER_SITE_OTHER): Admitting: Ophthalmology

## 2021-03-28 ENCOUNTER — Other Ambulatory Visit: Payer: Self-pay

## 2021-03-28 DIAGNOSIS — I10 Essential (primary) hypertension: Secondary | ICD-10-CM

## 2021-03-28 DIAGNOSIS — H35033 Hypertensive retinopathy, bilateral: Secondary | ICD-10-CM

## 2021-03-28 DIAGNOSIS — H353122 Nonexudative age-related macular degeneration, left eye, intermediate dry stage: Secondary | ICD-10-CM

## 2021-03-28 DIAGNOSIS — H4601 Optic papillitis, right eye: Secondary | ICD-10-CM | POA: Diagnosis not present

## 2021-03-28 DIAGNOSIS — H43813 Vitreous degeneration, bilateral: Secondary | ICD-10-CM | POA: Diagnosis not present

## 2021-04-02 ENCOUNTER — Other Ambulatory Visit: Payer: Self-pay | Admitting: Family Medicine

## 2021-04-02 DIAGNOSIS — M5136 Other intervertebral disc degeneration, lumbar region: Secondary | ICD-10-CM | POA: Diagnosis not present

## 2021-04-02 DIAGNOSIS — M25511 Pain in right shoulder: Secondary | ICD-10-CM | POA: Diagnosis not present

## 2021-04-09 DIAGNOSIS — H4710 Unspecified papilledema: Secondary | ICD-10-CM | POA: Diagnosis not present

## 2021-04-09 DIAGNOSIS — Z87891 Personal history of nicotine dependence: Secondary | ICD-10-CM | POA: Diagnosis not present

## 2021-04-09 DIAGNOSIS — C8590 Non-Hodgkin lymphoma, unspecified, unspecified site: Secondary | ICD-10-CM | POA: Diagnosis not present

## 2021-04-09 DIAGNOSIS — Z8572 Personal history of non-Hodgkin lymphomas: Secondary | ICD-10-CM | POA: Diagnosis not present

## 2021-04-09 DIAGNOSIS — H47011 Ischemic optic neuropathy, right eye: Secondary | ICD-10-CM | POA: Diagnosis not present

## 2021-04-27 ENCOUNTER — Other Ambulatory Visit: Payer: Self-pay

## 2021-04-27 ENCOUNTER — Observation Stay (HOSPITAL_COMMUNITY)
Admission: EM | Admit: 2021-04-27 | Discharge: 2021-04-29 | Disposition: A | Discharge status: Home / Self Care | Payer: Medicare Other | Attending: Internal Medicine | Admitting: Internal Medicine

## 2021-04-27 ENCOUNTER — Encounter (HOSPITAL_COMMUNITY): Payer: Self-pay | Admitting: Emergency Medicine

## 2021-04-27 ENCOUNTER — Emergency Department (HOSPITAL_COMMUNITY): Payer: Medicare Other

## 2021-04-27 DIAGNOSIS — M810 Age-related osteoporosis without current pathological fracture: Secondary | ICD-10-CM | POA: Diagnosis not present

## 2021-04-27 DIAGNOSIS — Z7901 Long term (current) use of anticoagulants: Secondary | ICD-10-CM | POA: Diagnosis not present

## 2021-04-27 DIAGNOSIS — R609 Edema, unspecified: Secondary | ICD-10-CM | POA: Diagnosis not present

## 2021-04-27 DIAGNOSIS — E039 Hypothyroidism, unspecified: Secondary | ICD-10-CM | POA: Diagnosis not present

## 2021-04-27 DIAGNOSIS — J449 Chronic obstructive pulmonary disease, unspecified: Secondary | ICD-10-CM | POA: Insufficient documentation

## 2021-04-27 DIAGNOSIS — Z79899 Other long term (current) drug therapy: Secondary | ICD-10-CM | POA: Insufficient documentation

## 2021-04-27 DIAGNOSIS — Z20822 Contact with and (suspected) exposure to covid-19: Secondary | ICD-10-CM | POA: Diagnosis not present

## 2021-04-27 DIAGNOSIS — G8929 Other chronic pain: Secondary | ICD-10-CM

## 2021-04-27 DIAGNOSIS — Z743 Need for continuous supervision: Secondary | ICD-10-CM | POA: Diagnosis not present

## 2021-04-27 DIAGNOSIS — Z87891 Personal history of nicotine dependence: Secondary | ICD-10-CM | POA: Insufficient documentation

## 2021-04-27 DIAGNOSIS — D649 Anemia, unspecified: Secondary | ICD-10-CM

## 2021-04-27 DIAGNOSIS — E871 Hypo-osmolality and hyponatremia: Secondary | ICD-10-CM | POA: Insufficient documentation

## 2021-04-27 DIAGNOSIS — Z789 Other specified health status: Secondary | ICD-10-CM

## 2021-04-27 DIAGNOSIS — I5032 Chronic diastolic (congestive) heart failure: Secondary | ICD-10-CM | POA: Diagnosis not present

## 2021-04-27 DIAGNOSIS — S61451A Open bite of right hand, initial encounter: Principal | ICD-10-CM | POA: Insufficient documentation

## 2021-04-27 DIAGNOSIS — M7989 Other specified soft tissue disorders: Secondary | ICD-10-CM | POA: Diagnosis not present

## 2021-04-27 DIAGNOSIS — I110 Hypertensive heart disease with heart failure: Secondary | ICD-10-CM | POA: Insufficient documentation

## 2021-04-27 DIAGNOSIS — I480 Paroxysmal atrial fibrillation: Secondary | ICD-10-CM | POA: Diagnosis not present

## 2021-04-27 DIAGNOSIS — Z23 Encounter for immunization: Secondary | ICD-10-CM | POA: Insufficient documentation

## 2021-04-27 DIAGNOSIS — J45909 Unspecified asthma, uncomplicated: Secondary | ICD-10-CM | POA: Insufficient documentation

## 2021-04-27 DIAGNOSIS — Z96651 Presence of right artificial knee joint: Secondary | ICD-10-CM | POA: Insufficient documentation

## 2021-04-27 DIAGNOSIS — E785 Hyperlipidemia, unspecified: Secondary | ICD-10-CM | POA: Diagnosis present

## 2021-04-27 DIAGNOSIS — W5501XA Bitten by cat, initial encounter: Secondary | ICD-10-CM | POA: Insufficient documentation

## 2021-04-27 DIAGNOSIS — Z7409 Other reduced mobility: Secondary | ICD-10-CM

## 2021-04-27 DIAGNOSIS — L03113 Cellulitis of right upper limb: Secondary | ICD-10-CM

## 2021-04-27 DIAGNOSIS — F329 Major depressive disorder, single episode, unspecified: Secondary | ICD-10-CM | POA: Diagnosis present

## 2021-04-27 DIAGNOSIS — I2510 Atherosclerotic heart disease of native coronary artery without angina pectoris: Secondary | ICD-10-CM | POA: Insufficient documentation

## 2021-04-27 DIAGNOSIS — I10 Essential (primary) hypertension: Secondary | ICD-10-CM | POA: Diagnosis present

## 2021-04-27 DIAGNOSIS — K219 Gastro-esophageal reflux disease without esophagitis: Secondary | ICD-10-CM | POA: Diagnosis present

## 2021-04-27 DIAGNOSIS — R6889 Other general symptoms and signs: Secondary | ICD-10-CM | POA: Diagnosis not present

## 2021-04-27 DIAGNOSIS — D500 Iron deficiency anemia secondary to blood loss (chronic): Secondary | ICD-10-CM | POA: Insufficient documentation

## 2021-04-27 LAB — CBC WITH DIFFERENTIAL/PLATELET
Abs Immature Granulocytes: 0.05 10*3/uL (ref 0.00–0.07)
Basophils Absolute: 0 10*3/uL (ref 0.0–0.1)
Basophils Relative: 0 %
Eosinophils Absolute: 0 10*3/uL (ref 0.0–0.5)
Eosinophils Relative: 0 %
HCT: 30.8 % — ABNORMAL LOW (ref 36.0–46.0)
Hemoglobin: 10.1 g/dL — ABNORMAL LOW (ref 12.0–15.0)
Immature Granulocytes: 0 %
Lymphocytes Relative: 55 %
Lymphs Abs: 8.2 10*3/uL — ABNORMAL HIGH (ref 0.7–4.0)
MCH: 30.4 pg (ref 26.0–34.0)
MCHC: 32.8 g/dL (ref 30.0–36.0)
MCV: 92.8 fL (ref 80.0–100.0)
Monocytes Absolute: 1 10*3/uL (ref 0.1–1.0)
Monocytes Relative: 7 %
Neutro Abs: 5.6 10*3/uL (ref 1.7–7.7)
Neutrophils Relative %: 38 %
Platelets: 204 10*3/uL (ref 150–400)
RBC: 3.32 MIL/uL — ABNORMAL LOW (ref 3.87–5.11)
RDW: 16.2 % — ABNORMAL HIGH (ref 11.5–15.5)
WBC: 14.9 10*3/uL — ABNORMAL HIGH (ref 4.0–10.5)
nRBC: 0 % (ref 0.0–0.2)

## 2021-04-27 LAB — BASIC METABOLIC PANEL
Anion gap: 5 (ref 5–15)
BUN: 19 mg/dL (ref 8–23)
CO2: 25 mmol/L (ref 22–32)
Calcium: 8.2 mg/dL — ABNORMAL LOW (ref 8.9–10.3)
Chloride: 95 mmol/L — ABNORMAL LOW (ref 98–111)
Creatinine, Ser: 0.67 mg/dL (ref 0.44–1.00)
GFR, Estimated: >60 mL/min (ref >60)
Glucose, Bld: 119 mg/dL — ABNORMAL HIGH (ref 70–99)
Potassium: 3.7 mmol/L (ref 3.5–5.1)
Sodium: 125 mmol/L — ABNORMAL LOW (ref 135–145)

## 2021-04-27 LAB — RESP PANEL BY RT-PCR (FLU A&B, COVID) ARPGX2
Influenza A by PCR: NEGATIVE
Influenza B by PCR: NEGATIVE
SARS Coronavirus 2 by RT PCR: NEGATIVE

## 2021-04-27 MED ORDER — METRONIDAZOLE 500 MG/100ML IV SOLN
500.0000 mg | Freq: Once | INTRAVENOUS | Status: AC
  Administered 2021-04-27: 500 mg via INTRAVENOUS
  Filled 2021-04-27: qty 100

## 2021-04-27 MED ORDER — MORPHINE SULFATE (PF) 2 MG/ML IV SOLN
2.0000 mg | Freq: Once | INTRAVENOUS | Status: AC
  Administered 2021-04-27: 2 mg via INTRAVENOUS
  Filled 2021-04-27: qty 1

## 2021-04-27 MED ORDER — SODIUM CHLORIDE 0.9 % IV SOLN
1.0000 g | Freq: Once | INTRAVENOUS | Status: AC
  Administered 2021-04-27: 1 g via INTRAVENOUS
  Filled 2021-04-27: qty 10

## 2021-04-27 MED ORDER — ONDANSETRON HCL 4 MG/2ML IJ SOLN
4.0000 mg | Freq: Once | INTRAMUSCULAR | Status: AC
  Administered 2021-04-27: 4 mg via INTRAVENOUS
  Filled 2021-04-27: qty 2

## 2021-04-27 NOTE — ED Notes (Signed)
Patient is a part of [facility]- please call Dr Sonny Dandy- hospice house medical director at [phone] with assessment and treatment plan

## 2021-04-27 NOTE — ED Triage Notes (Signed)
Pt was brushing her cat last night when it bit her. Pt presents today with pain and swelling to R hand as well as redness that extends mid forearm. Per pt, cat is up to date on all shots and is an "inside cat only".

## 2021-04-27 NOTE — ED Notes (Signed)
Son Octavia Bruckner [phone]  Call for updates

## 2021-04-27 NOTE — ED Provider Notes (Signed)
[facility] EMERGENCY DEPARTMENT Provider Note   CSN: [id number] Arrival date & time: 04/27/21  2004     History No chief complaint on file.   [name] is a 85 y.o. female.  HPI   Patient presents for animal bite. Her cat bit her in the right hand yesterday. She reports some fevers and no chills. She states her hand swelled and became increasingly painful. The cat is indoor only and is up to date on its shots. She denies SOB or chest pain. She has not had tetanus in the last 10 y ears. She is not diabetic.   She is on hospice care for non hodgkin lymphoma.   Past Medical History:  Diagnosis Date  . Anxiety   . Arthritis   . Asthmatic bronchitis   . Atrial fibrillation ([place])   . Bowel obstruction (HCC)    blockage  . CAD (coronary artery disease)    Stent to RI 2003.  Myoview 2013 no ischemia.  . Cataract   . Chronic back pain   . Chronic bronchitis ([place])   . Colon polyp    adenomatous  . Congestive heart disease (HCC)    Preserved EF  . Depression   . Esophageal motility disorder   . Fibromyalgia   . GERD (gastroesophageal reflux disease)   . History of hiatal hernia   . History of kidney stones   . Hyperlipidemia   . Hypertension   . Hypothyroid   . 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 Nephrolithiasis   . OA (osteoarthritis)   . Status post dilation of esophageal narrowing     Patient Active Problem List   Diagnosis Date Noted  . Educated about COVID-19 virus infection 04/04/2020  . 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) 03/08/2020  . Chronic diastolic (congestive) heart failure ([place]) 07/23/2019  . Chronic constipation 07/23/2019  . Urine retention 07/23/2019  . Arachnoid cyst of spine 07/23/2019  . Chronic hyponatremia 07/23/2019  . Palpitations 04/02/2019  . Snoring 02/19/2018  . Other fatigue 02/19/2018  . Chronic pain syndrome 01/20/2018  . Chronic low back pain 01/20/2018  . Chronic pain 07/02/2017  . Vocal cord  dysfunction 02/04/2017  . Irritable bowel syndrome with diarrhea 12/11/2015  . UTI (urinary tract infection) 08/22/2015  . Cough 07/26/2015  . Diarrhea 11/30/2014  . Heme + stool 11/30/2014  . Chronic anticoagulation 11/30/2014  . Hypertensive heart disease with chronic diastolic congestive heart failure ([place]) 04/30/2014  . 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 Osteoporosis 01/27/2014  . Fibromyalgia 08/09/2013  . 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 Acquired hypothyroidism 03/20/2010  . Major depression, chronic 03/20/2010  . ESOPHAGEAL MOTILITY DISORDER 03/20/2010  . Hyperlipidemia 04/22/2009  . Coronary atherosclerosis 04/22/2009  . ATRIAL FIBRILLATION, PAROXYSMAL 04/22/2009  . ESOPHAGEAL STRICTURE 10/31/2004  . GERD 10/31/2004  . HIATAL HERNIA 10/06/2001    Past Surgical History:  Procedure Laterality Date  . ABDOMINAL HYSTERECTOMY     partial  . ANKLE FUSION  08/27/2012   Procedure: ARTHRODESIS ANKLE;  Surgeon: Wylene Simmer, MD;  Location: [place];  Service: Orthopedics;  Laterality: Right;  Arthrodesis right ankle and subtalar joint  . APPENDECTOMY    . ARTHRODESIS TIBIOFIBULAR Right 03/31/2014   Procedure: REVISION OF SUBTALOR ARTHRODESIS  RIGHT ;  Surgeon: Wylene Simmer, MD;  Location: [place];  Service: Orthopedics;  Laterality: Right;  . BACK SURGERY    . BREAST SURGERY     Left breast lump  removed  . BRONCHOSCOPY    . CORONARY ANGIOPLASTY WITH STENT PLACEMENT  2003  . DECORTICATION Right 05/03/2014   Procedure: DECORTICATION;  Surgeon: [name], MD;  Location: [place];  Service: Thoracic;  Laterality: Right;  . EYE SURGERY  2016   cateracts  . FOOT ARTHRODESIS, SUBTALAR Right 2013  . HARDWARE REMOVAL Right 03/31/2014   Procedure: REMOVAL OF DEEP IMPLANTS X 3  RIGHT ;  Surgeon: Wylene Simmer, MD;  Location: [place];  Service: Orthopedics;  Laterality: Right;  . HARDWARE REMOVAL Right 11/03/2014   Procedure: HARDWARE REMOVAL OF SUBTALAR JOINT;  Surgeon: Wylene Simmer, MD;  Location: [place];  Service: Orthopedics;  Laterality: Right;  . HARDWARE REVISION  03/31/2014   SUBTALOR  ARTHRODESIS       DR HEWITT  . JOINT REPLACEMENT     Right knee  . KNEE ARTHROSCOPY  03/26/2012   Procedure: ARTHROSCOPY KNEE;  Surgeon: Wylene Simmer, MD;  Location: [place];  Service: Orthopedics;  Laterality: Left;  with Debridement of Lateral Meniscus tear  . REMOVAL OF IMPLANT Right 03/31/2014   DR HEWITT  . ROTATOR CUFF REPAIR Bilateral   . SINUS SURGERY WITH INSTATRAK    . SPINAL CORD STIMULATOR INSERTION N/A 07/02/2017   Procedure: LUMBAR SPINAL CORD STIMULATOR INSERTION;  Surgeon: Melina Schools, MD;  Location: [place];  Service: Orthopedics;  Laterality: N/A;  120 mins  . TOTAL KNEE ARTHROPLASTY Right   . VIDEO ASSISTED THORACOSCOPY (VATS)/EMPYEMA Right 05/03/2014   Procedure: VIDEO ASSISTED THORACOSCOPY (VATS)/EMPYEMA;  Surgeon: [name], MD;  Location: [place];  Service: Thoracic;  Laterality: Right;  Marland Kitchen VIDEO BRONCHOSCOPY N/A 05/03/2014   Procedure: VIDEO BRONCHOSCOPY;  Surgeon: [name], MD;  Location: [facility];  Service: Thoracic;  Laterality: N/A;     OB History   No obstetric history on file.     Family History  Problem Relation Age of Onset  . Prostate cancer Brother   . Cancer Brother        PROSTATE  . Heart disease Mother   . Asthma Mother   . Congestive Heart Failure Mother   . Emphysema Sister   . COPD Sister   . Stroke Sister   . Heart disease Sister   . Emphysema Brother   . Rheumatologic disease Neg Hx     Social History   Tobacco Use  . Smoking status: Former Smoker    Packs/day: 1.00    Years: 30.00    Pack years: 30.00    Types: Cigarettes    Quit date: 12/16/1989    Years since quitting: 31.3  . Smokeless tobacco: Never Used  . Tobacco comment: smoked off & on  Vaping Use  . Vaping Use: Never used  Substance Use Topics  . Alcohol use: No    Alcohol/week: 0.0 standard drinks  . Drug use: No     Home Medications Prior to Admission medications   Medication Sig Start Date End Date Taking? Authorizing Provider  albuterol (VENTOLIN HFA) 108 (90 Base) MCG/ACT inhaler Inhale 2 puffs into the lungs every 4 (four) hours as needed for wheezing or shortness of breath. 07/26/19   Gerlene Fee, NP  ALPRAZolam Duanne Moron) 0.5 MG tablet Take 1-2 tablets (0.5-1 mg total) by mouth at bedtime as needed for anxiety. 07/11/20   Janora Norlander, DO  Ascorbic Acid (VITAMIN C) 1000 MG tablet Take 1,000 mg by mouth daily.    [provider]  beta carotene  25000 UNIT capsule Take 25,000 Units by mouth daily.    [provider]  budesonide (PULMICORT) 0.5 MG/2ML nebulizer solution Take 2 mLs (0.5 mg total) by nebulization 2 (two) times daily. 09/01/19   Rigoberto Noel, MD  Coenzyme Q10 10 MG capsule Take 10 mg by mouth daily.    [provider]  cyclobenzaprine (FLEXERIL) 10 MG tablet Take 10 mg by mouth 3 (three) times daily as needed for muscle spasms.  11/08/19   [provider]  denosumab (PROLIA) 60 MG/ML SOSY injection Inject 60 mg every 6 (six) months into the skin. Administer in upper arm, thigh, or abdomen 01/15/19   Chipper Herb, MD  dicyclomine (BENTYL) 10 MG capsule TAKE 1 CAPSULE FOUR TIMES DAILY AS NEEDED FOR SEVERE PAIN Patient taking differently: Take 10 mg by mouth 4 (four) times daily as needed (severe pain).  08/18/20   Janora Norlander, DO  diltiazem (CARDIZEM CD) 120 MG 24 hr capsule Take 1 capsule (120 mg total) by mouth daily. (Needs to be seen before next refill) 01/05/21   Janora Norlander, DO  fluticasone (FLONASE) 50 MCG/ACT nasal spray Place 2 sprays into both nostrils daily.    [provider]  levothyroxine (SYNTHROID) 25 MCG tablet TAKE (2) TABLETS DAILY IN THE MORNING BEFORE BREAKFAST 03/05/21   Ronnie Doss M, DO  lidocaine (LIDODERM) 5 % Place 1 patch onto the skin daily. Remove & Discard patch within 12 hours or as  directed by MD 07/11/20   Janora Norlander, DO  linaclotide (LINZESS) 145 MCG CAPS capsule TAKE 1 CAPSULE DAILY TO REGULATE BOWEL MOVEMENTS (Needs to be seen before next refill) 01/05/21   Janora Norlander, DO  loratadine (CLARITIN) 10 MG tablet Take 1 tablet (10 mg total) by mouth daily. (Needs to be seen before next refill) 01/05/21   Janora Norlander, DO  meclizine (ANTIVERT) 25 MG tablet Take 1 tablet (25 mg total) by mouth 3 (three) times daily as needed for dizziness. 07/26/19   Gerlene Fee, NP  metoprolol tartrate (LOPRESSOR) 25 MG tablet TAKE 2 TABLETS IN THE MORNING, AND 1 TABLET IN THE EVENING 03/21/21   Minus Breeding, MD  montelukast (SINGULAIR) 10 MG tablet Take 1 tablet (10 mg total) by mouth daily. (Needs to be seen before next refill) 01/05/21   Janora Norlander, DO  Multiple Vitamin (MULTIVITAMIN) tablet Take 1 tablet by mouth daily. 07/21/19   [provider]  nitroGLYCERIN (NITROSTAT) 0.4 MG SL tablet Place 1 tablet (0.4 mg total) under the tongue every 5 (five) minutes as needed for chest pain. 10/02/20   Janora Norlander, DO  omega-3 acid ethyl esters (LOVAZA) 1 g capsule Take 2 capsules (2 g total) by mouth 2 (two) times daily. 07/26/19   Gerlene Fee, NP  omeprazole-sodium bicarbonate (ZEGERID) 40-1100 MG capsule Take 1 capsule by mouth daily. (Needs to be seen before next refill) 01/05/21   Ronnie Doss M, DO  ondansetron (ZOFRAN) 4 MG tablet TAKE 1 TABLET EVERY 6 HOURS AS NEEDED FOR NAUSEA & VOMITING Patient not taking: Reported on 09/01/2020 08/18/20   Janora Norlander, DO  polyethylene glycol (MIRALAX / GLYCOLAX) 17 g packet Take 17 g by mouth daily.    [provider]  rivaroxaban (XARELTO) 20 MG TABS tablet Take 1 tablet (20 mg total) by mouth daily. (Needs to be seen before next refill) 01/05/21   Janora Norlander, DO  rosuvastatin (CRESTOR) 20 MG tablet Take 1 tablet (  20 mg total) by mouth daily. (Needs to be seen before next refill)  01/05/21   Janora Norlander, DO  sennosides-docusate sodium (SENOKOT-S) 8.6-50 MG tablet Take 1 tablet by mouth at bedtime.    [provider]  sertraline (ZOLOFT) 100 MG tablet Take 1 tablet (100 mg total) by mouth daily. (NEEDS TO BE SEEN BEFORE NEXT REFILL) 02/22/21   Ronnie Doss M, DO  tamsulosin (FLOMAX) 0.4 MG CAPS capsule TAKE (1) CAPSULE DAILY Patient taking differently: Take 0.4 mg by mouth daily.  10/28/19   Janora Norlander, DO  traMADol (ULTRAM) 50 MG tablet Take 1 tablet (50 mg total) by mouth every 12 (twelve) hours as needed for severe pain. HOSPICE pt 10/03/20   Ronnie Doss M, DO    Allergies    Naproxen, Penicillins, Sulfonamide derivatives, Aspirin, Captopril, Strawberry extract, and Clindamycin/lincomycin  Review of Systems   Review of Systems  Constitutional: Positive for fever. Negative for chills.  HENT: Negative for ear pain and sore throat.   Eyes: Negative for pain and visual disturbance.  Respiratory: Negative for cough and shortness of breath.   Cardiovascular: Negative for chest pain and palpitations.  Gastrointestinal: Negative for abdominal pain and vomiting.  Genitourinary: Negative for dysuria and hematuria.  Musculoskeletal: Positive for joint swelling and myalgias. Negative for arthralgias and back pain.  Skin: Positive for color change.  Neurological: Negative for seizures and syncope.  All other systems reviewed and are negative.   Physical Exam Updated Vital Signs There were no vitals taken for this visit.  Physical Exam Vitals and nursing note reviewed. Exam conducted with a chaperone present.  Constitutional:      Appearance: Normal appearance.  HENT:     Head: Normocephalic and atraumatic.  Eyes:     General: No scleral icterus.       Right eye: No discharge.        Left eye: No discharge.     Extraocular Movements: Extraocular movements intact.     Pupils: Pupils are equal, round, and reactive to light.   Cardiovascular:     Rate and Rhythm: Normal rate and regular rhythm.     Pulses: Normal pulses.     Heart sounds: Normal heart sounds. No murmur heard. No friction rub. No gallop.   Pulmonary:     Effort: Pulmonary effort is normal. No respiratory distress.     Breath sounds: Normal breath sounds.  Abdominal:     General: Abdomen is flat. Bowel sounds are normal. There is no distension.     Palpations: Abdomen is soft.     Tenderness: There is no abdominal tenderness.  Musculoskeletal:        General: Swelling and tenderness present.     Comments: Right hand: edematous, erythematous. TTP. ROM compromised. Radial pulse 2+. Sensation in tact.   Skin:    General: Skin is warm and dry.     Coloration: Skin is not jaundiced.  Neurological:     Mental Status: She is alert. Mental status is at baseline.     Coordination: Coordination normal.     ED Results / Procedures / Treatments   Labs (all labs ordered are listed, but only abnormal results are displayed) Labs Reviewed - No data to display  EKG None  Radiology No results found.  Procedures Procedures   Medications Ordered in ED Medications - No data to display  ED Course  I have reviewed the triage vital signs and the nursing notes.  Pertinent labs &  imaging results that were available during my care of the patient were reviewed by me and considered in my medical decision making (see chart for details).    MDM Rules/Calculators/A&P                          Patient presents with obvious infection to the right hand. I'm concerned for cellulitis likely requiring admission. At this time, patient does not meet SIRS criteria.   Will obtain xray to rule out necrotizing fascitis. Starting her on ceftriaxone and metronidazole due to PCN allergy (swelling).   Xray without acute findings. See above for detailed results.  WBC elevated to 14.9. She also has microcytic anemia but not concerning enough to warrant transfusion.    Patient is hyponatremic to 125.   Patient will need to be admitted for IV antibiotics.   Discussed HPI, physical exam and plan of care for this patient with attending Ankit Nanavati. The attending physician evaluated this patient as part of a shared visit and agrees with plan of care.  Final Clinical Impression(s) / ED Diagnoses Final diagnoses:  None    Rx / DC Orders ED Discharge Orders    None       Sherrill Raring, Hershal Coria 04/27/21 2207    Varney Biles, MD 05/01/21 1359

## 2021-04-27 NOTE — ED Notes (Signed)
Pt c/o hand pain- Dr Kathrynn Humble made aware.

## 2021-04-28 ENCOUNTER — Encounter (HOSPITAL_COMMUNITY): Payer: Self-pay | Admitting: Internal Medicine

## 2021-04-28 ENCOUNTER — Other Ambulatory Visit: Payer: Self-pay | Admitting: Cardiology

## 2021-04-28 DIAGNOSIS — I480 Paroxysmal atrial fibrillation: Secondary | ICD-10-CM | POA: Diagnosis not present

## 2021-04-28 DIAGNOSIS — W5501XA Bitten by cat, initial encounter: Secondary | ICD-10-CM | POA: Diagnosis not present

## 2021-04-28 DIAGNOSIS — E871 Hypo-osmolality and hyponatremia: Secondary | ICD-10-CM | POA: Diagnosis not present

## 2021-04-28 DIAGNOSIS — S61451A Open bite of right hand, initial encounter: Secondary | ICD-10-CM | POA: Diagnosis not present

## 2021-04-28 DIAGNOSIS — M545 Low back pain, unspecified: Secondary | ICD-10-CM | POA: Diagnosis not present

## 2021-04-28 DIAGNOSIS — I5032 Chronic diastolic (congestive) heart failure: Secondary | ICD-10-CM | POA: Diagnosis not present

## 2021-04-28 DIAGNOSIS — W5501XD Bitten by cat, subsequent encounter: Secondary | ICD-10-CM | POA: Diagnosis not present

## 2021-04-28 DIAGNOSIS — L03113 Cellulitis of right upper limb: Secondary | ICD-10-CM | POA: Diagnosis not present

## 2021-04-28 DIAGNOSIS — E039 Hypothyroidism, unspecified: Secondary | ICD-10-CM

## 2021-04-28 DIAGNOSIS — I10 Essential (primary) hypertension: Secondary | ICD-10-CM

## 2021-04-28 DIAGNOSIS — S61451D Open bite of right hand, subsequent encounter: Secondary | ICD-10-CM

## 2021-04-28 DIAGNOSIS — D649 Anemia, unspecified: Secondary | ICD-10-CM

## 2021-04-28 DIAGNOSIS — F329 Major depressive disorder, single episode, unspecified: Secondary | ICD-10-CM

## 2021-04-28 DIAGNOSIS — K219 Gastro-esophageal reflux disease without esophagitis: Secondary | ICD-10-CM | POA: Diagnosis not present

## 2021-04-28 DIAGNOSIS — G8929 Other chronic pain: Secondary | ICD-10-CM | POA: Diagnosis not present

## 2021-04-28 LAB — CBC
HCT: 29.2 % — ABNORMAL LOW (ref 36.0–46.0)
Hemoglobin: 9.3 g/dL — ABNORMAL LOW (ref 12.0–15.0)
MCH: 29.7 pg (ref 26.0–34.0)
MCHC: 31.8 g/dL (ref 30.0–36.0)
MCV: 93.3 fL (ref 80.0–100.0)
Platelets: 182 10*3/uL (ref 150–400)
RBC: 3.13 MIL/uL — ABNORMAL LOW (ref 3.87–5.11)
RDW: 16.2 % — ABNORMAL HIGH (ref 11.5–15.5)
WBC: 13.3 10*3/uL — ABNORMAL HIGH (ref 4.0–10.5)
nRBC: 0 % (ref 0.0–0.2)

## 2021-04-28 LAB — RETICULOCYTES
Immature Retic Fract: 29 % — ABNORMAL HIGH (ref 2.3–15.9)
RBC.: 3.06 MIL/uL — ABNORMAL LOW (ref 3.87–5.11)
Retic Count, Absolute: 86 10*3/uL (ref 19.0–186.0)
Retic Ct Pct: 2.8 % (ref 0.4–3.1)

## 2021-04-28 LAB — COMPREHENSIVE METABOLIC PANEL
ALT: 12 U/L (ref 0–44)
AST: 19 U/L (ref 15–41)
Albumin: 2.5 g/dL — ABNORMAL LOW (ref 3.5–5.0)
Alkaline Phosphatase: 44 U/L (ref 38–126)
Anion gap: 6 (ref 5–15)
BUN: 18 mg/dL (ref 8–23)
CO2: 24 mmol/L (ref 22–32)
Calcium: 8.3 mg/dL — ABNORMAL LOW (ref 8.9–10.3)
Chloride: 98 mmol/L (ref 98–111)
Creatinine, Ser: 0.64 mg/dL (ref 0.44–1.00)
GFR, Estimated: >60 mL/min (ref >60)
Glucose, Bld: 101 mg/dL — ABNORMAL HIGH (ref 70–99)
Potassium: 3.7 mmol/L (ref 3.5–5.1)
Sodium: 128 mmol/L — ABNORMAL LOW (ref 135–145)
Total Bilirubin: 0.8 mg/dL (ref 0.3–1.2)
Total Protein: 9.5 g/dL — ABNORMAL HIGH (ref 6.5–8.1)

## 2021-04-28 LAB — FERRITIN: Ferritin: 33 ng/mL (ref 11–307)

## 2021-04-28 LAB — IRON AND TIBC
Iron: 44 ug/dL (ref 28–170)
Saturation Ratios: 14 % (ref 10.4–31.8)
TIBC: 310 ug/dL (ref 250–450)
UIBC: 266 ug/dL

## 2021-04-28 LAB — VITAMIN B12: Vitamin B-12: 378 pg/mL (ref 180–914)

## 2021-04-28 LAB — FOLATE: Folate: 44.9 ng/mL (ref >5.9)

## 2021-04-28 MED ORDER — NITROGLYCERIN 0.4 MG SL SUBL: 0.4000 mg | SUBLINGUAL_TABLET | SUBLINGUAL | Status: DC | PRN

## 2021-04-28 MED ORDER — POLYETHYLENE GLYCOL 3350 17 G PO PACK: 17.0000 g | PACK | Freq: Every day | ORAL | Status: DC | PRN

## 2021-04-28 MED ORDER — TETANUS-DIPHTH-ACELL PERTUSSIS 5-2.5-18.5 LF-MCG/0.5 IM SUSY
0.5000 mL | PREFILLED_SYRINGE | Freq: Once | INTRAMUSCULAR | Status: AC
  Administered 2021-04-28: 0.5 mL via INTRAMUSCULAR
  Filled 2021-04-28: qty 0.5

## 2021-04-28 MED ORDER — RIVAROXABAN 15 MG PO TABS
15.0000 mg | ORAL_TABLET | Freq: Every day | ORAL | Status: DC
  Administered 2021-04-29: 15 mg via ORAL
  Filled 2021-04-28: qty 1

## 2021-04-28 MED ORDER — METRONIDAZOLE 500 MG/100ML IV SOLN
500.0000 mg | Freq: Three times a day (TID) | INTRAVENOUS | Status: DC
  Administered 2021-04-28 – 2021-04-29 (×5): 500 mg via INTRAVENOUS
  Filled 2021-04-28 (×5): qty 100

## 2021-04-28 MED ORDER — SODIUM CHLORIDE 0.9 % IV SOLN: INTRAVENOUS | Status: DC

## 2021-04-28 MED ORDER — PANTOPRAZOLE SODIUM 40 MG PO TBEC
40.0000 mg | DELAYED_RELEASE_TABLET | Freq: Every day | ORAL | Status: DC
  Administered 2021-04-28 – 2021-04-29 (×2): 40 mg via ORAL
  Filled 2021-04-28 (×2): qty 1

## 2021-04-28 MED ORDER — ONDANSETRON HCL 4 MG PO TABS
4.0000 mg | ORAL_TABLET | Freq: Four times a day (QID) | ORAL | Status: DC | PRN
  Administered 2021-04-28: 4 mg via ORAL
  Filled 2021-04-28: qty 1

## 2021-04-28 MED ORDER — RIVAROXABAN 20 MG PO TABS
20.0000 mg | ORAL_TABLET | Freq: Every day | ORAL | Status: DC
  Administered 2021-04-28: 20 mg via ORAL
  Filled 2021-04-28 (×2): qty 1

## 2021-04-28 MED ORDER — SERTRALINE HCL 50 MG PO TABS
100.0000 mg | ORAL_TABLET | Freq: Every day | ORAL | Status: DC
  Administered 2021-04-28 – 2021-04-29 (×2): 100 mg via ORAL
  Filled 2021-04-28 (×2): qty 2

## 2021-04-28 MED ORDER — LORATADINE 10 MG PO TABS
10.0000 mg | ORAL_TABLET | Freq: Every day | ORAL | Status: DC
  Administered 2021-04-28 – 2021-04-29 (×2): 10 mg via ORAL
  Filled 2021-04-28 (×2): qty 1

## 2021-04-28 MED ORDER — KETOROLAC TROMETHAMINE 30 MG/ML IJ SOLN
30.0000 mg | Freq: Once | INTRAMUSCULAR | Status: AC
  Administered 2021-04-28: 30 mg via INTRAVENOUS
  Filled 2021-04-28: qty 1

## 2021-04-28 MED ORDER — ONDANSETRON HCL 4 MG/2ML IJ SOLN: 4.0000 mg | Freq: Four times a day (QID) | INTRAMUSCULAR | Status: DC | PRN

## 2021-04-28 MED ORDER — MORPHINE SULFATE (PF) 2 MG/ML IV SOLN
2.0000 mg | Freq: Once | INTRAVENOUS | Status: AC
Start: 2021-04-28 — End: 2021-04-28
  Administered 2021-04-28: 2 mg via INTRAVENOUS
  Filled 2021-04-28: qty 1

## 2021-04-28 MED ORDER — ACETAMINOPHEN 650 MG RE SUPP: 650.0000 mg | Freq: Four times a day (QID) | RECTAL | Status: DC | PRN

## 2021-04-28 MED ORDER — ALBUTEROL SULFATE HFA 108 (90 BASE) MCG/ACT IN AERS: 2.0000 | INHALATION_SPRAY | RESPIRATORY_TRACT | Status: DC | PRN

## 2021-04-28 MED ORDER — METOPROLOL TARTRATE 25 MG PO TABS
25.0000 mg | ORAL_TABLET | Freq: Every day | ORAL | Status: DC
  Administered 2021-04-28: 25 mg via ORAL
  Filled 2021-04-28: qty 1

## 2021-04-28 MED ORDER — LIDOCAINE 5 % EX PTCH: 1.0000 | MEDICATED_PATCH | Freq: Every day | CUTANEOUS | Status: DC | PRN

## 2021-04-28 MED ORDER — DILTIAZEM HCL ER COATED BEADS 120 MG PO CP24
120.0000 mg | ORAL_CAPSULE | Freq: Every day | ORAL | Status: DC
  Administered 2021-04-28 – 2021-04-29 (×2): 120 mg via ORAL
  Filled 2021-04-28 (×2): qty 1

## 2021-04-28 MED ORDER — OXYCODONE HCL 5 MG PO TABS
5.0000 mg | ORAL_TABLET | ORAL | Status: DC | PRN
  Administered 2021-04-28 – 2021-04-29 (×2): 5 mg via ORAL
  Filled 2021-04-28 (×2): qty 1

## 2021-04-28 MED ORDER — ALPRAZOLAM 0.5 MG PO TABS
0.5000 mg | ORAL_TABLET | Freq: Every evening | ORAL | Status: DC | PRN
Start: 2021-04-28 — End: 2021-04-29
  Administered 2021-04-28: 1 mg via ORAL
  Filled 2021-04-28: qty 2

## 2021-04-28 MED ORDER — SENNOSIDES-DOCUSATE SODIUM 8.6-50 MG PO TABS
1.0000 | ORAL_TABLET | Freq: Every day | ORAL | Status: DC
  Administered 2021-04-28: 1 via ORAL
  Filled 2021-04-28: qty 1

## 2021-04-28 MED ORDER — LEVOTHYROXINE SODIUM 25 MCG PO TABS
25.0000 ug | ORAL_TABLET | Freq: Every day | ORAL | Status: DC
  Administered 2021-04-28 – 2021-04-29 (×2): 25 ug via ORAL
  Filled 2021-04-28 (×2): qty 1

## 2021-04-28 MED ORDER — SODIUM CHLORIDE 0.9 % IV SOLN
1.0000 g | INTRAVENOUS | Status: DC
  Administered 2021-04-28: 1 g via INTRAVENOUS
  Filled 2021-04-28: qty 10

## 2021-04-28 MED ORDER — METOPROLOL TARTRATE 25 MG PO TABS
50.0000 mg | ORAL_TABLET | Freq: Every day | ORAL | Status: DC
  Administered 2021-04-28 – 2021-04-29 (×2): 50 mg via ORAL
  Filled 2021-04-28 (×2): qty 2

## 2021-04-28 MED ORDER — ACETAMINOPHEN 325 MG PO TABS
650.0000 mg | ORAL_TABLET | Freq: Four times a day (QID) | ORAL | Status: DC | PRN
  Administered 2021-04-28 – 2021-04-29 (×2): 650 mg via ORAL
  Filled 2021-04-28 (×2): qty 2

## 2021-04-28 NOTE — H&P (Signed)
History and Physical    [name] KWI:[id number] DOB: 1936-06-10 DOA: 04/27/2021  PCP: Janora Norlander, DO   Patient coming from: Home.  I have personally briefly reviewed patient's old medical records in [place]  Chief Complaint: Hand swelling.  HPI: [name] is a 85 y.o. female with medical history significant of anxiety, depression, osteoarthritis, asthmatic bronchitis, paroxysmal atrial fibrillation, SBO, CAD, cataract, chronic back pain, chronic diastolic heart failure, GERD, hiatal hernia, history of esophageal dilatation, fibromyalgia, urolithiasis, hyperlipidemia, hypertension, hypothyroidism who is coming to the emergency department due to right hand swelling associated with erythema and tenderness to palpation extending to her forearm after she was bit by her cat while she was brushing him.  She stated that all the feline vaccinations are up-to-date and he is an indoors cat.  She denies fever, but has felt chills.  No appetite changes, nausea, emesis, dyspnea, chest pain, palpitations, lower extremity edema, abdominal pain, diarrhea, constipation, melena, hematochezia, dysuria, frequency or hematuria.  No polyuria, polydipsia, polyphagia or blurred vision.  ED Course: Initial vital signs were temperature 99.5 F, pulse 80, respiration 18, BP 1 5456 mmHg O2 sat 95% on room air.  The patient received morphine 2 mg IVP, ondansetron 4 mg IVP x1, ceftriaxone 1 g IVPB and metronidazole 500 mg IVPB.  Lab work: CBC showed a white count of 14.9 with 38% neutrophils and 55% lymphocytes.  Hemoglobin 10.1 g/dL and platelets 204.  Sodium 125, potassium 3.7, chloride 95 CO2 25 mmol/L.  Her renal function was normal.  Glucose 119 and calcium 8.2 mg/dL, but awaiting for albumin level for calcium correction.  Imaging: Right hand x-ray shows soft tissue swelling, but no fractures or radiolucent foreign bodies.  Review of Systems: As per HPI otherwise all other systems reviewed and  are negative.  Past Medical History:  Diagnosis Date  . Anxiety   . Arthritis   . Asthmatic bronchitis   . Atrial fibrillation ([place])   . Bowel obstruction (HCC)    blockage  . CAD (coronary artery disease)    Stent to RI 2003.  Myoview 2013 no ischemia.  . Cataract   . Chronic back pain   . Chronic bronchitis ([place])   . Colon polyp    adenomatous  . Congestive heart disease (HCC)    Preserved EF  . Depression   . Esophageal motility disorder   . Fibromyalgia   . GERD (gastroesophageal reflux disease)   . History of hiatal hernia   . History of kidney stones   . Hyperlipidemia   . Hypertension   . Hypothyroid   . 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 Nephrolithiasis   . OA (osteoarthritis)   . Status post dilation of esophageal narrowing     Past Surgical History:  Procedure Laterality Date  . ABDOMINAL HYSTERECTOMY     partial  . ANKLE FUSION  08/27/2012   Procedure: ARTHRODESIS ANKLE;  Surgeon: Wylene Simmer, MD;  Location: [facility];  Service: Orthopedics;  Laterality: Right;  Arthrodesis right ankle and subtalar joint  . APPENDECTOMY    . ARTHRODESIS TIBIOFIBULAR Right 03/31/2014   Procedure: REVISION OF SUBTALOR ARTHRODESIS  RIGHT ;  Surgeon: Wylene Simmer, MD;  Location: [facility];  Service: Orthopedics;  Laterality: Right;  . BACK SURGERY    . BREAST SURGERY     Left breast lump removed  . BRONCHOSCOPY    . CORONARY ANGIOPLASTY WITH STENT PLACEMENT  2003  .  DECORTICATION Right 05/03/2014   Procedure: DECORTICATION;  Surgeon: [name], MD;  Location: [facility] [place] [facility];  Service: Thoracic;  Laterality: Right;  . EYE SURGERY  2016   cateracts  . FOOT ARTHRODESIS, SUBTALAR Right 2013  . HARDWARE REMOVAL Right 03/31/2014   Procedure: REMOVAL OF DEEP IMPLANTS X 3  RIGHT ;  Surgeon: Wylene Simmer, MD;  Location: [facility];  Service: Orthopedics;  Laterality: Right;  . HARDWARE REMOVAL Right 11/03/2014   Procedure: HARDWARE REMOVAL OF SUBTALAR JOINT;  Surgeon:  Wylene Simmer, MD;  Location: [facility];  Service: Orthopedics;  Laterality: Right;  . HARDWARE REVISION  03/31/2014   SUBTALOR  ARTHRODESIS       DR HEWITT  . JOINT REPLACEMENT     Right knee  . KNEE ARTHROSCOPY  03/26/2012   Procedure: ARTHROSCOPY KNEE;  Surgeon: Wylene Simmer, MD;  Location: [facility];  Service: Orthopedics;  Laterality: Left;  with Debridement of Lateral Meniscus tear  . REMOVAL OF IMPLANT Right 03/31/2014   DR HEWITT  . ROTATOR CUFF REPAIR Bilateral   . SINUS SURGERY WITH INSTATRAK    . SPINAL CORD STIMULATOR INSERTION N/A 07/02/2017   Procedure: LUMBAR SPINAL CORD STIMULATOR INSERTION;  Surgeon: Melina Schools, MD;  Location: [facility];  Service: Orthopedics;  Laterality: N/A;  120 mins  . TOTAL KNEE ARTHROPLASTY Right   . VIDEO ASSISTED THORACOSCOPY (VATS)/EMPYEMA Right 05/03/2014   Procedure: VIDEO ASSISTED THORACOSCOPY (VATS)/EMPYEMA;  Surgeon: [name], MD;  Location: [facility];  Service: Thoracic;  Laterality: Right;  Marland Kitchen VIDEO BRONCHOSCOPY N/A 05/03/2014   Procedure: VIDEO BRONCHOSCOPY;  Surgeon: [name], MD;  Location: [facility];  Service: Thoracic;  Laterality: N/A;    Social History  reports that she quit smoking about 31 years ago. Her smoking use included cigarettes. She has a 30.00 pack-year smoking history. She has never used smokeless tobacco. She reports that she does not drink alcohol and does not use drugs.  Allergies  Allergen Reactions  . Naproxen Other (See Comments)    Tongue swelling  . Penicillins Swelling    Swelling around site Has patient had a PCN reaction causing immediate rash, facial/tongue/throat swelling, SOB or lightheadedness with hypotension: Yes Has patient had a PCN reaction causing severe rash involving mucus membranes or skin necrosis: No Has patient had a PCN reaction that required hospitalization: No Has patient had a PCN reaction occurring within the last 10 years: No If all of the above answers are "NO", then  may proceed with Cephalosporin use.   . Sulfonamide Derivatives Hives  . Aspirin Other (See Comments)    REACTION: regular strength causes "heart to beat fast"  . Captopril Hypertension  . Strawberry Extract Other (See Comments)  . Clindamycin/Lincomycin Other (See Comments)    unknown    Family History  Problem Relation Age of Onset  . Prostate cancer Brother   . Cancer Brother        PROSTATE  . Heart disease Mother   . Asthma Mother   . Congestive Heart Failure Mother   . Emphysema Sister   . COPD Sister   . Stroke Sister   . Heart disease Sister   . Emphysema Brother   . Rheumatologic disease Neg Hx    Prior to Admission medications   Medication Sig Start Date End Date Taking? Authorizing Provider  albuterol (VENTOLIN HFA) 108 (90 Base) MCG/ACT inhaler Inhale 2 puffs into the lungs every 4 (four) hours as needed for wheezing or shortness  of breath. 07/26/19   Gerlene Fee, NP  ALPRAZolam Duanne Moron) 0.5 MG tablet Take 1-2 tablets (0.5-1 mg total) by mouth at bedtime as needed for anxiety. 07/11/20   Janora Norlander, DO  Ascorbic Acid (VITAMIN C) 1000 MG tablet Take 1,000 mg by mouth daily.    [provider]  beta carotene 25000 UNIT capsule Take 25,000 Units by mouth daily.    [provider]  budesonide (PULMICORT) 0.5 MG/2ML nebulizer solution Take 2 mLs (0.5 mg total) by nebulization 2 (two) times daily. 09/01/19   Rigoberto Noel, MD  Coenzyme Q10 10 MG capsule Take 10 mg by mouth daily.    [provider]  cyclobenzaprine (FLEXERIL) 10 MG tablet Take 10 mg by mouth 3 (three) times daily as needed for muscle spasms.  11/08/19   [provider]  denosumab (PROLIA) 60 MG/ML SOSY injection Inject 60 mg every 6 (six) months into the skin. Administer in upper arm, thigh, or abdomen 01/15/19   Chipper Herb, MD  dicyclomine (BENTYL) 10 MG capsule TAKE 1 CAPSULE FOUR TIMES DAILY AS NEEDED FOR SEVERE PAIN Patient taking differently: Take 10  mg by mouth 4 (four) times daily as needed (severe pain).  08/18/20   Janora Norlander, DO  diltiazem (CARDIZEM CD) 120 MG 24 hr capsule Take 1 capsule (120 mg total) by mouth daily. (Needs to be seen before next refill) 01/05/21   Janora Norlander, DO  fluticasone (FLONASE) 50 MCG/ACT nasal spray Place 2 sprays into both nostrils daily.    [provider]  levothyroxine (SYNTHROID) 25 MCG tablet TAKE (2) TABLETS DAILY IN THE MORNING BEFORE BREAKFAST 03/05/21   Ronnie Doss M, DO  lidocaine (LIDODERM) 5 % Place 1 patch onto the skin daily. Remove & Discard patch within 12 hours or as directed by MD 07/11/20   Janora Norlander, DO  linaclotide (LINZESS) 145 MCG CAPS capsule TAKE 1 CAPSULE DAILY TO REGULATE BOWEL MOVEMENTS (Needs to be seen before next refill) 01/05/21   Janora Norlander, DO  loratadine (CLARITIN) 10 MG tablet Take 1 tablet (10 mg total) by mouth daily. (Needs to be seen before next refill) 01/05/21   Janora Norlander, DO  meclizine (ANTIVERT) 25 MG tablet Take 1 tablet (25 mg total) by mouth 3 (three) times daily as needed for dizziness. 07/26/19   Gerlene Fee, NP  metoprolol tartrate (LOPRESSOR) 25 MG tablet TAKE 2 TABLETS IN THE MORNING, AND 1 TABLET IN THE EVENING 03/21/21   Minus Breeding, MD  montelukast (SINGULAIR) 10 MG tablet Take 1 tablet (10 mg total) by mouth daily. (Needs to be seen before next refill) 01/05/21   Janora Norlander, DO  Multiple Vitamin (MULTIVITAMIN) tablet Take 1 tablet by mouth daily. 07/21/19   [provider]  nitroGLYCERIN (NITROSTAT) 0.4 MG SL tablet Place 1 tablet (0.4 mg total) under the tongue every 5 (five) minutes as needed for chest pain. 10/02/20   Janora Norlander, DO  omega-3 acid ethyl esters (LOVAZA) 1 g capsule Take 2 capsules (2 g total) by mouth 2 (two) times daily. 07/26/19   Gerlene Fee, NP  omeprazole-sodium bicarbonate (ZEGERID) 40-1100 MG capsule Take 1 capsule by mouth daily. (Needs to be seen  before next refill) 01/05/21   Janora Norlander, DO  ondansetron (ZOFRAN) 4 MG tablet TAKE 1 TABLET EVERY 6 HOURS AS NEEDED FOR NAUSEA & VOMITING Patient not taking: Reported on 09/01/2020 08/18/20   Janora Norlander, DO  polyethylene glycol (MIRALAX / GLYCOLAX) 17 g packet Take 17 g by mouth daily.    [provider]  rivaroxaban (XARELTO) 20 MG TABS tablet Take 1 tablet (20 mg total) by mouth daily. (Needs to be seen before next refill) 01/05/21   Janora Norlander, DO  rosuvastatin (CRESTOR) 20 MG tablet Take 1 tablet (20 mg total) by mouth daily. (Needs to be seen before next refill) 01/05/21   Janora Norlander, DO  sennosides-docusate sodium (SENOKOT-S) 8.6-50 MG tablet Take 1 tablet by mouth at bedtime.    [provider]  sertraline (ZOLOFT) 100 MG tablet Take 1 tablet (100 mg total) by mouth daily. (NEEDS TO BE SEEN BEFORE NEXT REFILL) 02/22/21   Ronnie Doss M, DO  tamsulosin (FLOMAX) 0.4 MG CAPS capsule TAKE (1) CAPSULE DAILY Patient taking differently: Take 0.4 mg by mouth daily.  10/28/19   Janora Norlander, DO  traMADol (ULTRAM) 50 MG tablet Take 1 tablet (50 mg total) by mouth every 12 (twelve) hours as needed for severe pain. HOSPICE pt 10/03/20   Janora Norlander, DO    Physical Exam: Vitals:   04/27/21 2323 04/27/21 2325 04/28/21 0000 04/28/21 0030  BP:  (!) 155/67 138/84 (!) 150/74  Pulse:  74 74 74  Resp: 17 17 18 17   Temp:      TempSrc:      SpO2:  93% 91% 92%  Weight:      Height:        Constitutional: NAD, calm, comfortable Eyes: PERRL, lids and conjunctivae normal ENMT: Mucous membranes are moist. Posterior pharynx clear of any exudate or lesions. Neck: normal, supple, no masses, no thyromegaly Respiratory: clear to auscultation bilaterally, no wheezing, no crackles. Normal respiratory effort. No accessory muscle use.  Cardiovascular: Regular rate and rhythm, no murmurs / rubs / gallops. No extremity edema. 2+ pedal pulses. No  carotid bruits.  Abdomen: No distention.  Bowel sounds positive.  Soft, no tenderness, no masses palpated. No hepatosplenomegaly.   Musculoskeletal: Mild generalized weakness.  No clubbing / cyanosis.  Good ROM, no contractures. Normal muscle tone.  Skin: Positive edema, erythema, calor and TTP of right hand extending to right forearm.  Please see pictures below. Neurologic: CN 2-12 grossly intact. Sensation intact, DTR normal. Strength 5/5 in all 4.  Psychiatric: Normal judgment and insight. Alert and oriented x 3. Normal mood.         Labs on Admission: I have personally reviewed following labs and imaging studies  CBC: Recent Labs  Lab 04/27/21 2055  WBC 14.9*  NEUTROABS 5.6  HGB 10.1*  HCT 30.8*  MCV 92.8  PLT 0000000    Basic Metabolic Panel: Recent Labs  Lab 04/27/21 2055  NA 125*  K 3.7  CL 95*  CO2 25  GLUCOSE 119*  BUN 19  CREATININE 0.67  CALCIUM 8.2*    GFR: Estimated Creatinine Clearance: 38.3 mL/min (by C-G formula based on SCr of 0.67 mg/dL).  Liver Function Tests: No results for input(s): AST, ALT, ALKPHOS, BILITOT, PROT, ALBUMIN in the last 168 hours.  Urine analysis:    Component Value Date/Time   COLORURINE YELLOW 11/01/2020 1210   APPEARANCEUR CLEAR 11/01/2020 1210   APPEARANCEUR Clear 08/18/2020 1441   LABSPEC 1.009 11/01/2020 1210   PHURINE 7.0 11/01/2020 1210   GLUCOSEU NEGATIVE 11/01/2020 1210   HGBUR NEGATIVE 11/01/2020 1210   BILIRUBINUR NEGATIVE 11/01/2020 1210   BILIRUBINUR Negative 08/18/2020 1441   KETONESUR NEGATIVE 11/01/2020 1210   PROTEINUR NEGATIVE  11/01/2020 1210   UROBILINOGEN negative 09/08/2015 1757   UROBILINOGEN 1.0 08/31/2012 0803   NITRITE NEGATIVE 11/01/2020 1210   LEUKOCYTESUR NEGATIVE 11/01/2020 1210    Radiological Exams on Admission: DG Hand Complete Right  Result Date: 04/27/2021 CLINICAL DATA:  Hand swelling and pain after cat bite EXAM: RIGHT HAND - COMPLETE 3+ VIEW COMPARISON:  Left wrist radiograph  November 24, 2019. FINDINGS: There is no evidence of fracture or dislocation. Diffuse demineralization of bone. Productive degenerative change of the DIPs and PIP joints as well as the second MCP and first CMC joint. Diffuse demineralization of bone. Chondrocalcinosis of the triangular fibrocartilage. No radiopaque foreign body. IMPRESSION: No acute osseous abnormality or radiopaque foreign body of the right hand. Electronically Signed   By: Dahlia Bailiff MD   On: 04/27/2021 20:59   EKG: Independently reviewed.   Assessment/Plan Principal Problem:   Cat bite of right hand Place in observation/telemetry. Analgesics as needed. Allergic to penicillin. Continue ceftriaxone 1 g IVPB daily. Continue metronidazole 500 mg IVPB every 8 hours. Consider hand surgery evaluation if no improvement.  Active Problems:   Acquired hypothyroidism Continue levothyroxine 25 mcg p.o. daily.    Hyperlipidemia Not sure of the dosage. Continue rosuvastatin after med rec performed.    Major depression, chronic Continue sertraline 100 mg p.o. daily. Alprazolam as needed for insomnia or anxiety.    HTN (hypertension) Continue Cardizem 120 mg p.o. daily. Continue metoprolol 50 mg in a.m. Continue metoprolol 25 mg in p.m. Monitor blood pressure and heart rate.    Paroxysmal atrial fibrillation (HCC) CHA?DS?-VASc Score of at least 6. Continue metoprolol and Cardizem. Continue Xarelto in the evenings.    COPD with asthma ([place]) Supplemental oxygen and bronchodilators as needed.    GERD Continue PPI.    Chronic diastolic (congestive) heart failure (HCC) No signs of decompensation. Continue beta-blocker.    Chronic hyponatremia Fluid restriction. Normal saline at 75 mL/h. Follow-up sodium level.    Normocytic anemia Monitor H&H. Check anemia panel.     DVT prophylaxis: On Xarelto. Code Status:   Full code. Family Communication: Disposition Plan:   Patient is from:  Home.  Anticipated DC  to:  Home.  Anticipated DC date:  04/30/2021.  Anticipated DC barriers: Clinical status.  Consults called: Admission status:  Observation/telemetry.   Severity of Illness: High severity due to rapidly expanding edema and erythema in the setting of right hand cat bite cellulitis.  The patient will need to remain in the hospital for 2 to 3 days for IV antibiotic therapy.  Reubin Milan MD [facility]  How to contact the [facility] Attending or Consulting provider Agency or covering provider during after hours [place], for this patient?   1. Check the care team in [facility] and look for a) attending/consulting TR[facility] provider listed and b) the [facility] team listed 2. Log into [email or web address] and use [facility]'s universal password to access. If you do not have the password, please contact the hospital operator. 3. Locate the [facility] provider you are looking for under [facility] and page to a number that you can be directly reached. 4. If you still have difficulty reaching the provider, please page the [facility] (Director on Call) for the Hospitalists listed on amion for assistance.  04/28/2021, 1:19 AM   This document was prepared using Dragon voice recognition software and may contain some unintended transcription errors.

## 2021-04-28 NOTE — Progress Notes (Signed)
PROGRESS NOTE    [name]  QMV:[id number] DOB: 1936-07-24 DOA: 04/27/2021 PCP: Janora Norlander, DO    Chief Complaint  Patient presents with  . Animal Bite    Brief admission narrative:  As per H&P written by Dr. Olevia Bowens on 04/27/2021 [name] Common is a 85 y.o. female with medical history significant of anxiety, depression, osteoarthritis, asthmatic bronchitis, paroxysmal atrial fibrillation, SBO, CAD, cataract, chronic back pain, chronic diastolic heart failure, GERD, hiatal hernia, history of esophageal dilatation, fibromyalgia, urolithiasis, hyperlipidemia, hypertension, hypothyroidism who is coming to the emergency department due to right hand swelling associated with erythema and tenderness to palpation extending to her forearm after she was bit by her cat while she was brushing him.  She stated that all the feline vaccinations are up-to-date and he is an indoors cat.  She denies fever, but has felt chills.  No appetite changes, nausea, emesis, dyspnea, chest pain, palpitations, lower extremity edema, abdominal pain, diarrhea, constipation, melena, hematochezia, dysuria, frequency or hematuria.  No polyuria, polydipsia, polyphagia or blurred vision.  ED Course: Initial vital signs were temperature 99.5 F, pulse 80, respiration 18, BP 1 5456 mmHg O2 sat 95% on room air.  The patient received morphine 2 mg IVP, ondansetron 4 mg IVP x1, ceftriaxone 1 g IVPB and metronidazole 500 mg IVPB.  Lab work: CBC showed a white count of 14.9 with 38% neutrophils and 55% lymphocytes.  Hemoglobin 10.1 g/dL and platelets 204.  Sodium 125, potassium 3.7, chloride 95 CO2 25 mmol/L.  Her renal function was normal.  Glucose 119 and calcium 8.2 mg/dL, but awaiting for albumin level for calcium correction.  Assessment & Plan: 1-cellulitis of right hand/forearm after cat bite. -Patient with underlying history of penicillin allergies; also allergic to clindamycin and sulfa antibiotics; making more  difficult to treat. -Case discussed with ID service and will use cephalosporin along with metronidazole. -Continue to follow clinical response -Limb elevation, cold compresses and use of Tylenol and will be provided. -Hopefully home in the next 24 hours. -Continue supportive care. -Patient is afebrile. -Tdap booster provided.  2-leukocytosis -In the setting of problem #1; continue to follow daily -Continue current antibiotics.  3-hypertension/hyperlipidemia -Continue heart healthy diet -Continue the use of Cardizem and metoprolol.  4-paroxysmal atrial fibrillation -Stable overall -Continue telemetry monitoring -Continue Cardizem, Metoprolol and Xarelto for secondary prevention.  5-hypothyroidism -Continue Synthroid.  6-gastroesophageal reflux disease -Continue PPI.  7-depression/anxiety -Stable mood -Continue Zoloft     Cat bite of right hand Active Problems:   Acquired hypothyroidism   Hyperlipidemia   Major depression, chronic   HTN (hypertension)   Paroxysmal atrial fibrillation (HCC)   COPD with asthma (HCC)   GERD   Chronic diastolic (congestive) heart failure (HCC)   Chronic hyponatremia   Normocytic anemia   DVT prophylaxis: Chronically on Xarelto. Code Status: Full code Family Communication: No family at bedside. Disposition:   Status is: Observation  Dispo: The patient is from: Home              Anticipated d/c is to: Home              Patient currently not medically stable for discharge; still with significant swelling and ongoing pain in her hand and forearm.  We will continue treatment with IV antibiotics, cold compresses, analgesics/anti-inflammatory agents and will follow response.   Difficult to place patient no    Consultants:   ID curbside (Dr. Tommy Medal).   Procedures:  See below for x-ray reports.  Antimicrobials:  Ceftriaxone and Flagyl   Subjective: No fever; reports no chest pain, palpitations, nausea or vomiting.  Still  complaining of pain, erythematous changes and swelling on her right hand/forearm.  Patient expressed mild difficulty making a fist and holding things.  Objective: Vitals:   04/28/21 0219 04/28/21 0300 04/28/21 0533 04/28/21 1303  BP: (!) 133/50  (!) 119/42 (!) 107/54  Pulse: 75  75 (!) 59  Resp: 18  18 16   Temp: 100.1 F (37.8 C)  100 F (37.8 C) 98.1 F (36.7 C)  TempSrc: Oral  Oral Oral  SpO2: 95%  96% 96%  Weight:  50.2 kg    Height:  5' (1.524 m)      Intake/Output Summary (Last 24 hours) at 04/28/2021 1813 Last data filed at 04/28/2021 1644 Gross per 24 hour  Intake 1550.98 ml  Output 0 ml  Net 1550.98 ml   Filed Weights   04/27/21 2011 04/28/21 0300  Weight: 50 kg 50.2 kg    Examination:  General exam:, No chest pain, no nausea, no vomiting.  Reporting significant pain and swelling of her right hand/forearm. Respiratory system: Good air movement bilaterally, no wheezing, no crackles, no use of accessory muscles. Cardiovascular system: Rate controlled, no rubs, no gallops, positive soft systolic ejection murmur; irregular rhythm.  No JVD. Gastrointestinal system: Abdomen is nondistended, soft and nontender. No organomegaly or masses felt. Normal bowel sounds heard. Central nervous system: Alert and oriented. No focal neurological deficits. Extremities: No cyanosis or clubbing. Skin: No petechiae.  Right hand/forearm red, swollen and tender to palpation. Psychiatry: Mood & affect appropriate.     Data Reviewed: I have personally reviewed following labs and imaging studies  CBC: Recent Labs  Lab 04/27/21 2055 04/28/21 0440  WBC 14.9* 13.3*  NEUTROABS 5.6  --   HGB 10.1* 9.3*  HCT 30.8* 29.2*  MCV 92.8 93.3  PLT 204 659    Basic Metabolic Panel: Recent Labs  Lab 04/27/21 2055 04/28/21 0440  NA 125* 128*  K 3.7 3.7  CL 95* 98  CO2 25 24  GLUCOSE 119* 101*  BUN 19 18  CREATININE 0.67 0.64  CALCIUM 8.2* 8.3*    GFR: Estimated Creatinine  Clearance: 38.3 mL/min (by C-G formula based on SCr of 0.64 mg/dL).  Liver Function Tests: Recent Labs  Lab 04/28/21 0440  AST 19  ALT 12  ALKPHOS 44  BILITOT 0.8  PROT 9.5*  ALBUMIN 2.5*    CBG: No results for input(s): GLUCAP in the last 168 hours.   Recent Results (from the past 240 hour(s))  Resp Panel by RT-PCR (Flu A&B, Covid) Nasopharyngeal Swab     Status: None   Collection Time: 04/27/21 10:00 PM   Specimen: Nasopharyngeal Swab; Nasopharyngeal(NP) swabs in vial transport medium  Result Value Ref Range Status   SARS Coronavirus 2 by RT PCR NEGATIVE NEGATIVE Final    Comment: (NOTE) SARS-CoV-2 target nucleic acids are NOT DETECTED.  The SARS-CoV-2 RNA is generally detectable in upper respiratory specimens during the acute phase of infection. The lowest concentration of SARS-CoV-2 viral copies this assay can detect is 138 copies/mL. A negative result does not preclude SARS-Cov-2 infection and should not be used as the sole basis for treatment or other patient management decisions. A negative result may occur with  improper specimen collection/handling, submission of specimen other than nasopharyngeal swab, presence of viral mutation(s) within the areas targeted by this assay, and inadequate number of viral copies(<138 copies/mL). A negative result must be  combined with clinical observations, patient history, and epidemiological information. The expected result is Negative.  Fact Sheet for Patients:  EntrepreneurPulse.com.au  Fact Sheet for Healthcare Providers:  IncredibleEmployment.be  This test is no t yet approved or cleared by the [place] FDA and  has been authorized for detection and/or diagnosis of SARS-CoV-2 by FDA under an Emergency Use Authorization (EUA). This EUA will remain  in effect (meaning this test can be used) for the duration of the COVID-19 declaration under Section 564(b)(1) of the Act,  21 U.S.C.section 360bbb-3(b)(1), unless the authorization is terminated  or revoked sooner.       Influenza A by PCR NEGATIVE NEGATIVE Final   Influenza B by PCR NEGATIVE NEGATIVE Final    Comment: (NOTE) The Xpert Xpress SARS-CoV-2/FLU/RSV plus assay is intended as an aid in the diagnosis of influenza from Nasopharyngeal swab specimens and should not be used as a sole basis for treatment. Nasal washings and aspirates are unacceptable for Xpert Xpress SARS-CoV-2/FLU/RSV testing.  Fact Sheet for Patients: EntrepreneurPulse.com.au  Fact Sheet for Healthcare Providers: IncredibleEmployment.be  This test is not yet approved or cleared by the [place] FDA and has been authorized for detection and/or diagnosis of SARS-CoV-2 by FDA under an Emergency Use Authorization (EUA). This EUA will remain in effect (meaning this test can be used) for the duration of the COVID-19 declaration under Section 564(b)(1) of the Act, 21 U.S.C. section 360bbb-3(b)(1), unless the authorization is terminated or revoked.  Performed at [facility], [address], [place], [place] [address]      Radiology Studies: DG Hand Complete Right  Result Date: 04/27/2021 CLINICAL DATA:  Hand swelling and pain after cat bite EXAM: RIGHT HAND - COMPLETE 3+ VIEW COMPARISON:  Left wrist radiograph November 24, 2019. FINDINGS: There is no evidence of fracture or dislocation. Diffuse demineralization of bone. Productive degenerative change of the DIPs and PIP joints as well as the second MCP and first CMC joint. Diffuse demineralization of bone. Chondrocalcinosis of the triangular fibrocartilage. No radiopaque foreign body. IMPRESSION: No acute osseous abnormality or radiopaque foreign body of the right hand. Electronically Signed   By: Dahlia Bailiff MD   On: 04/27/2021 20:59    Scheduled Meds: . diltiazem  120 mg Oral Daily  . levothyroxine  25 mcg Oral Daily  . loratadine  10 mg  Oral Daily  . metoprolol tartrate  25 mg Oral QHS  . metoprolol tartrate  50 mg Oral Daily  . pantoprazole  40 mg Oral Daily  . [START ON 04/29/2021] rivaroxaban  15 mg Oral Daily  . senna-docusate  1 tablet Oral QHS  . sertraline  100 mg Oral Daily   Continuous Infusions: . sodium chloride 75 mL/hr at 04/28/21 1644  . cefTRIAXone (ROCEPHIN)  IV    . metronidazole 500 mg (04/28/21 1425)     LOS: 0 days    Time spent: 30 minutes   Barton Dubois, MD [facility]   To contact the attending provider between 7A-7P or the covering provider during after hours 7P-7A, please log into the web site [email or web address] and access using universal [place] password for that web site. If you do not have the password, please call the hospital operator.  04/28/2021, 6:13 PM

## 2021-04-28 NOTE — Plan of Care (Signed)

## 2021-04-29 DIAGNOSIS — E871 Hypo-osmolality and hyponatremia: Secondary | ICD-10-CM

## 2021-04-29 DIAGNOSIS — I5032 Chronic diastolic (congestive) heart failure: Secondary | ICD-10-CM

## 2021-04-29 DIAGNOSIS — M545 Low back pain, unspecified: Secondary | ICD-10-CM

## 2021-04-29 DIAGNOSIS — E039 Hypothyroidism, unspecified: Secondary | ICD-10-CM | POA: Diagnosis not present

## 2021-04-29 DIAGNOSIS — I480 Paroxysmal atrial fibrillation: Secondary | ICD-10-CM | POA: Diagnosis not present

## 2021-04-29 DIAGNOSIS — S61451D Open bite of right hand, subsequent encounter: Secondary | ICD-10-CM | POA: Diagnosis not present

## 2021-04-29 DIAGNOSIS — L03113 Cellulitis of right upper limb: Secondary | ICD-10-CM | POA: Diagnosis not present

## 2021-04-29 DIAGNOSIS — G8929 Other chronic pain: Secondary | ICD-10-CM

## 2021-04-29 DIAGNOSIS — I10 Essential (primary) hypertension: Secondary | ICD-10-CM | POA: Diagnosis not present

## 2021-04-29 DIAGNOSIS — K219 Gastro-esophageal reflux disease without esophagitis: Secondary | ICD-10-CM

## 2021-04-29 DIAGNOSIS — W5501XD Bitten by cat, subsequent encounter: Secondary | ICD-10-CM | POA: Diagnosis not present

## 2021-04-29 LAB — CBC
HCT: 27.2 % — ABNORMAL LOW (ref 36.0–46.0)
Hemoglobin: 8.5 g/dL — ABNORMAL LOW (ref 12.0–15.0)
MCH: 29.7 pg (ref 26.0–34.0)
MCHC: 31.3 g/dL (ref 30.0–36.0)
MCV: 95.1 fL (ref 80.0–100.0)
Platelets: 171 10*3/uL (ref 150–400)
RBC: 2.86 MIL/uL — ABNORMAL LOW (ref 3.87–5.11)
RDW: 15.9 % — ABNORMAL HIGH (ref 11.5–15.5)
WBC: 9.5 10*3/uL (ref 4.0–10.5)
nRBC: 0 % (ref 0.0–0.2)

## 2021-04-29 LAB — BASIC METABOLIC PANEL
Anion gap: 5 (ref 5–15)
BUN: 24 mg/dL — ABNORMAL HIGH (ref 8–23)
CO2: 20 mmol/L — ABNORMAL LOW (ref 22–32)
Calcium: 7.8 mg/dL — ABNORMAL LOW (ref 8.9–10.3)
Chloride: 102 mmol/L (ref 98–111)
Creatinine, Ser: 0.7 mg/dL (ref 0.44–1.00)
GFR, Estimated: >60 mL/min (ref >60)
Glucose, Bld: 74 mg/dL (ref 70–99)
Potassium: 3.7 mmol/L (ref 3.5–5.1)
Sodium: 127 mmol/L — ABNORMAL LOW (ref 135–145)

## 2021-04-29 MED ORDER — METRONIDAZOLE 500 MG PO TABS
500.0000 mg | ORAL_TABLET | Freq: Two times a day (BID) | ORAL | Status: DC
  Filled 2021-04-29: qty 1

## 2021-04-29 MED ORDER — METRONIDAZOLE 500 MG PO TABS: 500.0000 mg | ORAL_TABLET | Freq: Two times a day (BID) | ORAL | 0 refills | Status: AC

## 2021-04-29 MED ORDER — LIDOCAINE 5 % EX PTCH: 1.0000 | MEDICATED_PATCH | CUTANEOUS | 12 refills | Status: AC

## 2021-04-29 MED ORDER — CEFDINIR 300 MG PO CAPS
300.0000 mg | ORAL_CAPSULE | Freq: Two times a day (BID) | ORAL | Status: DC
  Filled 2021-04-29: qty 1

## 2021-04-29 MED ORDER — CEFDINIR 300 MG PO CAPS: 300.0000 mg | ORAL_CAPSULE | Freq: Two times a day (BID) | ORAL | 0 refills | Status: AC

## 2021-04-29 MED ORDER — ACETAMINOPHEN 325 MG PO TABS: 650.0000 mg | ORAL_TABLET | Freq: Four times a day (QID) | ORAL | 0 refills | Status: AC | PRN

## 2021-04-29 NOTE — Discharge Summary (Signed)
Physician Discharge Summary  [name] [id number] DOB: December 18, 1935 DOA: 04/27/2021  PCP: [name] Norlander, DO  Admit date: 04/27/2021 Discharge date: 04/29/2021  Time spent: 35 minutes  Recommendations for Outpatient Follow-up:  1. Repeat basic metabolic panel to follow lites and renal function 2. Repeat CBC to assure stability of WBCs 3. Reassess complete resolution of acute cellulitic infection in her right hand after completion of antibiotic therapy.   Discharge Diagnoses:  Principal Problem:   Cat bite of right hand Active Problems:   Acquired hypothyroidism   Hyperlipidemia   Major depression, chronic   HTN (hypertension)   Paroxysmal atrial fibrillation (HCC)   COPD with asthma (HCC)   GERD   Chronic diastolic (congestive) heart failure (HCC)   Chronic hyponatremia   Normocytic anemia   Cellulitis of right upper extremity   Discharge Condition: Stable and improved.  Discharged home with instruction to follow-up with PCP in 10 days.  CODE STATUS: Full code.  Diet recommendation: Heart healthy diet.  Filed Weights   04/27/21 2011 04/28/21 0300  Weight: 50 kg 50.2 kg    History of present illness:  As per H&P written by Dr. Olevia [name] on 04/27/2021 [name] Churches Sharpeis a 85 y.o.femalewith medical history significant ofanxiety, depression, osteoarthritis, asthmatic bronchitis, paroxysmal atrial fibrillation, SBO, CAD, cataract, chronic back pain, chronic diastolic heart failure, GERD, hiatal hernia, history of esophageal dilatation, fibromyalgia, urolithiasis, hyperlipidemia, hypertension, hypothyroidism who is coming to the emergency department due to right hand swelling associated with erythema and tenderness to palpation extending to her forearm after she was bit by her cat while she was brushing him. She stated that all the feline vaccinations are up-to-date and he is an indoors cat. She denies fever, but has felt chills. No appetite changes, nausea, emesis,  dyspnea, chest pain, palpitations, lower extremity edema, abdominal pain, diarrhea, constipation, melena, hematochezia, dysuria, frequency or hematuria. No polyuria, polydipsia, polyphagia or blurred vision.  ED Course:Initial vital signs were temperature99.5 F, pulse 80, respiration 18, BP 1 5456 mmHg O2 sat 95% on room air. The patient received morphine 2 mg IVP, ondansetron 4 mg IVP x1, ceftriaxone 1 g IVPB and metronidazole 500 mg IVPB.  Lab work:CBC showed a white count of 14.9 with 38% neutrophils and 55% lymphocytes. Hemoglobin 10.1 g/dL and platelets 204. Sodium 125, potassium 3.7, chloride 95 CO2 25 mmol/L. Her renal function was normal. Glucose 119 and calcium 8.2 mg/dL, but awaiting for albumin level for calcium correction.  Hospital Course:  1-cellulitis of right hand/forearm after cat bite. -Patient with underlying history of penicillin allergies; also allergic to clindamycin and sulfa antibiotics. -Case discussed with ID service and will use cephalosporin along with metronidazole.  Patient instructed to complete 10 more days of antibiotics at discharge. -Continue as needed analgesics. -Continue to keep limb elevated and applying cold compresses. -Patient demonstrating significant improvement in swelling, erythema pain in her right hand.  Will discharge home to complete oral antibiotic therapy. -Tdap booster provided.  2-leukocytosis -In the setting of problem #1; continue to follow daily -Complete antibiotic therapy as instructed by infectious disease service -WBC within normal limits at discharge.  3-hypertension/hyperlipidemia -Continue to follow heart healthy diet -Continue the use of Cardizem and metoprolol.  4-paroxysmal atrial fibrillation -Stable overall -Continue t outpatient follow-up with cardiology service. -Continue Cardizem, Metoprolol and Xarelto for secondary prevention.  5-hypothyroidism -Continue Synthroid.  6-gastroesophageal reflux  disease -Continue PPI.  7-depression/anxiety -Stable mood; no suicidal ideation or hallucinations. -Continue Zoloft  8-chronic diastolic heart failure -Stable and  compensated -Continue to follow daily weights and low-sodium diet -Continue home medication and outpatient follow-up with cardiology service.  Procedures:  See below for x-ray reports.  Consultations:  Infectious disease curbside (Dr. Tommy [name])   Discharge Exam: Vitals:   04/29/21 0632 04/29/21 0715  BP: (!) 116/43   Pulse: 72   Resp: 18   Temp: 98.9 F (37.2 C) 98.7 F (37.1 C)  SpO2: 95%     General: Afebrile currently (low-grade temperature appreciated overnight), no chest pain, no nausea, no vomiting.  Patient expressed significant improvement in the swelling and pain in her right hand and would like to go home. Cardiovascular: Rate controlled, no rubs, no gallops, no JVD on exam. Respiratory: Clear to auscultation bilaterally; no using accessory muscle.  Good saturation on room air. Abdomen: Soft, nontender, distended, positive bowel sounds Extremities: No cyanosis or clubbing.  Significant improvement appreciated in her right hand, minimal erythematous changes and just mild swelling.  Patient expresses still some discomfort especially with movement; but feeling ready to go home and continue treatment as an outpatient.  Discharge Instructions   Discharge Instructions    Diet - low sodium heart healthy   Complete by: As directed    Discharge instructions   Complete by: As directed    Keep yourself well-hydrated Follow heart healthy diet Continue to check your weight on daily basis and follow heart healthy/low-sodium diet. Arrange follow-up with PCP in 10 days Take medications/antibiotics as prescribed (make sure to take antibiotics along with food to minimize GI upset symptoms) Keep right upper extremity elevated as much as possible and continue applying cold compresses at least 4 times a day.    Increase activity slowly   Complete by: As directed      Allergies as of 04/29/2021      Reactions   Naproxen Other (See Comments)   Tongue swelling   Penicillins Swelling   Swelling around site Has patient had a PCN reaction causing immediate rash, facial/tongue/throat swelling, SOB or lightheadedness with hypotension: Yes Has patient had a PCN reaction causing severe rash involving mucus membranes or skin necrosis: No Has patient had a PCN reaction that required hospitalization: No Has patient had a PCN reaction occurring within the last 10 years: No If all of the above answers are "NO", then may proceed with Cephalosporin use.   Sulfonamide Derivatives Hives   Aspirin Other (See Comments)   REACTION: regular strength causes "heart to beat fast"   Captopril Hypertension   Strawberry Extract Other (See Comments)   Clindamycin/lincomycin Other (See Comments)   unknown      Medication List    TAKE these medications   acetaminophen 325 MG tablet Commonly known as: TYLENOL Take 2 tablets (650 mg total) by mouth every 6 (six) hours as needed for mild pain or headache (or Fever >/= 101).   albuterol 108 (90 Base) MCG/ACT inhaler Commonly known as: VENTOLIN HFA Inhale 2 puffs into the lungs every 4 (four) hours as needed for wheezing or shortness of breath.   ALPRAZolam 0.5 MG tablet Commonly known as: XANAX Take 1-2 tablets (0.5-1 mg total) by mouth at bedtime as needed for anxiety.   benzonatate 100 MG capsule Commonly known as: TESSALON Take by mouth 3 (three) times daily as needed for cough.   budesonide 0.5 MG/2ML nebulizer solution Commonly known as: PULMICORT Take 2 mLs (0.5 mg total) by nebulization 2 (two) times daily.   cefdinir 300 MG capsule Commonly known as: OMNICEF Take 1 capsule (300  mg total) by mouth every 12 (twelve) hours.   cyclobenzaprine 10 MG tablet Commonly known as: FLEXERIL Take 10 mg by mouth 3 (three) times daily as needed for muscle  spasms.   diltiazem 120 MG 24 hr capsule Commonly known as: CARDIZEM CD Take 1 capsule (120 mg total) by mouth daily. (Needs to be seen before next refill)   fluticasone 50 MCG/ACT nasal spray Commonly known as: FLONASE Place 2 sprays into both nostrils daily.   gabapentin 600 MG tablet Commonly known as: NEURONTIN Take 600 mg by mouth 3 (three) times daily.   levothyroxine 25 MCG tablet Commonly known as: SYNTHROID TAKE (2) TABLETS DAILY IN THE MORNING BEFORE BREAKFAST   lidocaine 5 % Commonly known as: Lidoderm Place 1 patch onto the skin daily. Remove & Discard patch within 12 hours or as directed by MD   Linzess 290 MCG Caps capsule Generic drug: linaclotide Take 290 mcg by mouth daily as needed (bowel regimen).   loratadine 10 MG tablet Commonly known as: CLARITIN Take 1 tablet (10 mg total) by mouth daily. (Needs to be seen before next refill)   meclizine 25 MG tablet Commonly known as: ANTIVERT Take 1 tablet (25 mg total) by mouth 3 (three) times daily as needed for dizziness.   metoprolol tartrate 25 MG tablet Commonly known as: LOPRESSOR TAKE 2 TABLETS IN THE MORNING, AND 1 TABLET IN THE EVENING What changed: See the new instructions.   metroNIDAZOLE 500 MG tablet Commonly known as: Flagyl Take 1 tablet (500 mg total) by mouth 2 (two) times daily for 10 days.   montelukast 10 MG tablet Commonly known as: SINGULAIR Take 1 tablet (10 mg total) by mouth daily. (Needs to be seen before next refill)   multivitamin tablet Take 1 tablet by mouth daily.   nitroGLYCERIN 0.4 MG SL tablet Commonly known as: NITROSTAT Place 1 tablet (0.4 mg total) under the tongue every 5 (five) minutes as needed for chest pain.   omeprazole-sodium bicarbonate 40-1100 MG capsule Commonly known as: ZEGERID Take 1 capsule by mouth daily. (Needs to be seen before next refill)   ondansetron 4 MG tablet Commonly known as: ZOFRAN Take 4 mg by mouth every 6 (six) hours as needed for  nausea or vomiting.   Oxycodone HCl 10 MG Tabs Take 1 mg by mouth every 4 (four) hours as needed (pain).   polyethylene glycol 17 g packet Commonly known as: MIRALAX / GLYCOLAX Take 17 g by mouth daily.   rivaroxaban 20 MG Tabs tablet Commonly known as: Xarelto Take 1 tablet (20 mg total) by mouth daily. (Needs to be seen before next refill)   sennosides-docusate sodium 8.6-50 MG tablet Commonly known as: SENOKOT-S Take 1 tablet by mouth at bedtime.   sertraline 100 MG tablet Commonly known as: ZOLOFT Take 1 tablet (100 mg total) by mouth daily. (NEEDS TO BE SEEN BEFORE NEXT REFILL)      Allergies  Allergen Reactions  . Naproxen Other (See Comments)    Tongue swelling  . Penicillins Swelling    Swelling around site Has patient had a PCN reaction causing immediate rash, facial/tongue/throat swelling, SOB or lightheadedness with hypotension: Yes Has patient had a PCN reaction causing severe rash involving mucus membranes or skin necrosis: No Has patient had a PCN reaction that required hospitalization: No Has patient had a PCN reaction occurring within the last 10 years: No If all of the above answers are "NO", then may proceed with Cephalosporin use.   . Sulfonamide  Derivatives Hives  . Aspirin Other (See Comments)    REACTION: regular strength causes "heart to beat fast"  . Captopril Hypertension  . Strawberry Extract Other (See Comments)  . Clindamycin/Lincomycin Other (See Comments)    unknown    Follow-up Information    Ronnie Doss M, DO. Schedule an appointment as soon as possible for a visit in 10 day(s).   Specialty: Family Medicine Contact information: [place] [address] [phone]        Minus Breeding, MD .   Specialty: Cardiology Contact information: [address] [place] [address] [phone]               The results of significant diagnostics from this hospitalization (including imaging,  microbiology, ancillary and laboratory) are listed below for reference.    Significant Diagnostic Studies: DG Hand Complete Right  Result Date: 04/27/2021 CLINICAL DATA:  Hand swelling and pain after cat bite EXAM: RIGHT HAND - COMPLETE 3+ VIEW COMPARISON:  Left wrist radiograph November 24, 2019. FINDINGS: There is no evidence of fracture or dislocation. Diffuse demineralization of bone. Productive degenerative change of the DIPs and PIP joints as well as the second MCP and first CMC joint. Diffuse demineralization of bone. Chondrocalcinosis of the triangular fibrocartilage. No radiopaque foreign body. IMPRESSION: No acute osseous abnormality or radiopaque foreign body of the right hand. Electronically Signed   By: Dahlia Bailiff MD   On: 04/27/2021 20:59    Microbiology: Recent Results (from the past 240 hour(s))  Resp Panel by RT-PCR (Flu A&B, Covid) Nasopharyngeal Swab     Status: None   Collection Time: 04/27/21 10:00 PM   Specimen: Nasopharyngeal Swab; Nasopharyngeal(NP) swabs in vial transport medium  Result Value Ref Range Status   SARS Coronavirus 2 by RT PCR NEGATIVE NEGATIVE Final    Comment: (NOTE) SARS-CoV-2 target nucleic acids are NOT DETECTED.  The SARS-CoV-2 RNA is generally detectable in upper respiratory specimens during the acute phase of infection. The lowest concentration of SARS-CoV-2 viral copies this assay can detect is 138 copies/mL. A negative result does not preclude SARS-Cov-2 infection and should not be used as the sole basis for treatment or other patient management decisions. A negative result may occur with  improper specimen collection/handling, submission of specimen other than nasopharyngeal swab, presence of viral mutation(s) within the areas targeted by this assay, and inadequate number of viral copies(<138 copies/mL). A negative result must be combined with clinical observations, patient history, and epidemiological information. The expected result  is Negative.  Fact Sheet for Patients:  EntrepreneurPulse.com.au  Fact Sheet for Healthcare Providers:  IncredibleEmployment.be  This test is no t yet approved or cleared by the [place] FDA and  has been authorized for detection and/or diagnosis of SARS-CoV-2 by FDA under an Emergency Use Authorization (EUA). This EUA will remain  in effect (meaning this test can be used) for the duration of the COVID-19 declaration under Section 564(b)(1) of the Act, 21 U.S.C.section 360bbb-3(b)(1), unless the authorization is terminated  or revoked sooner.       Influenza A by PCR NEGATIVE NEGATIVE Final   Influenza B by PCR NEGATIVE NEGATIVE Final    Comment: (NOTE) The Xpert Xpress SARS-CoV-2/FLU/RSV plus assay is intended as an aid in the diagnosis of influenza from Nasopharyngeal swab specimens and should not be used as a sole basis for treatment. Nasal washings and aspirates are unacceptable for Xpert Xpress SARS-CoV-2/FLU/RSV testing.  Fact Sheet for Patients: EntrepreneurPulse.com.au  Fact Sheet  for Healthcare Providers: IncredibleEmployment.be  This test is not yet approved or cleared by the [place] and has been authorized for detection and/or diagnosis of SARS-CoV-2 by FDA under an Emergency Use Authorization (EUA). This EUA will remain in effect (meaning this test can be used) for the duration of the COVID-19 declaration under Section 564(b)(1) of the Act, 21 U.S.C. section 360bbb-3(b)(1), unless the authorization is terminated or revoked.  Performed at [facility], [address] [place], [place] [address]      Labs: Basic Metabolic Panel: Recent Labs  Lab 04/27/21 2055 04/28/21 0440 04/29/21 0513  NA 125* 128* 127*  K 3.7 3.7 3.7  CL 95* 98 102  CO2 25 24 20*  GLUCOSE 119* 101* 74  BUN 19 18 24*  CREATININE 0.67 0.64 0.70  CALCIUM 8.2* 8.3* 7.8*   Liver Function  Tests: Recent Labs  Lab 04/28/21 0440  AST 19  ALT 12  ALKPHOS 44  BILITOT 0.8  PROT 9.5*  ALBUMIN 2.5*   CBC: Recent Labs  Lab 04/27/21 2055 04/28/21 0440 04/29/21 0513  WBC 14.9* 13.3* 9.5  NEUTROABS 5.6  --   --   HGB 10.1* 9.3* 8.5*  HCT 30.8* 29.2* 27.2*  MCV 92.8 93.3 95.1  PLT 204 182 171    Signed:  Barton Dubois MD.  [facility] 04/29/2021, 1:13 PM

## 2021-04-29 NOTE — Plan of Care (Signed)

## 2021-04-29 NOTE — Care Management Obs Status (Signed)
[place] NOTIFICATION   Patient Details  Name: [name] MRN: [id number] Date of Birth: Aug 06, 1936   Medicare Observation Status Notification Given:  Yes    Natasha Bence, LCSW 04/29/2021, 9:14 AM

## 2021-04-30 ENCOUNTER — Telehealth: Payer: Self-pay | Admitting: Cardiology

## 2021-04-30 DIAGNOSIS — R69 Illness, unspecified: Secondary | ICD-10-CM | POA: Diagnosis not present

## 2021-04-30 MED ORDER — METOPROLOL TARTRATE 25 MG PO TABS: ORAL_TABLET | ORAL | 1 refills | Status: AC

## 2021-04-30 NOTE — Telephone Encounter (Signed)
    Pt c/o medication issue:  1. Name of Medication:   metoprolol tartrate (LOPRESSOR) 25 MG tablet     2. How are you currently taking this medication (dosage and times per day)? TAKE 2 TABLETS IN THE MORNING, AND 1 TABLET IN THE EVENING  3. Are you having a reaction (difficulty breathing--STAT)?  4. What is your medication issue? [place] calling, he said he needs to verify quantity, it doesn't add up. He said pt need to take total of 3 tablets a day but only gave 60 tablets.

## 2021-04-30 NOTE — Telephone Encounter (Signed)
Request resent.for #90 with 1 refill .[name]

## 2021-05-07 DIAGNOSIS — R531 Weakness: Secondary | ICD-10-CM | POA: Diagnosis not present

## 2021-05-07 DIAGNOSIS — Z743 Need for continuous supervision: Secondary | ICD-10-CM | POA: Diagnosis not present

## 2021-05-07 DIAGNOSIS — R52 Pain, unspecified: Secondary | ICD-10-CM | POA: Diagnosis not present

## 2021-05-07 DIAGNOSIS — Z7401 Bed confinement status: Secondary | ICD-10-CM | POA: Diagnosis not present

## 2021-05-22 DIAGNOSIS — H4710 Unspecified papilledema: Secondary | ICD-10-CM | POA: Diagnosis not present

## 2021-05-22 DIAGNOSIS — H47091 Other disorders of optic nerve, not elsewhere classified, right eye: Secondary | ICD-10-CM | POA: Diagnosis not present

## 2021-05-22 DIAGNOSIS — H547 Unspecified visual loss: Secondary | ICD-10-CM | POA: Diagnosis not present

## 2021-05-24 DIAGNOSIS — W19XXXA Unspecified fall, initial encounter: Secondary | ICD-10-CM | POA: Diagnosis not present

## 2021-05-24 DIAGNOSIS — R5381 Other malaise: Secondary | ICD-10-CM | POA: Diagnosis not present

## 2021-05-24 DIAGNOSIS — R69 Illness, unspecified: Secondary | ICD-10-CM | POA: Diagnosis not present

## 2021-07-02 ENCOUNTER — Other Ambulatory Visit: Payer: Self-pay | Admitting: Family Medicine

## 2021-09-15 DEATH — deceased
